# Patient Record
Sex: Female | Born: 1935 | Race: White | Hispanic: No | Smoking: Never smoker
Health system: Southern US, Community
[De-identification: ages and names within clinical notes are randomized; demographics above are authoritative.]

## PROBLEM LIST (undated history)

## (undated) DIAGNOSIS — I4891 Unspecified atrial fibrillation: Secondary | ICD-10-CM

## (undated) DIAGNOSIS — D631 Anemia in chronic kidney disease: Secondary | ICD-10-CM

## (undated) DIAGNOSIS — Z89619 Acquired absence of unspecified leg above knee: Secondary | ICD-10-CM

## (undated) DIAGNOSIS — I5032 Chronic diastolic (congestive) heart failure: Secondary | ICD-10-CM

## (undated) DIAGNOSIS — N289 Disorder of kidney and ureter, unspecified: Secondary | ICD-10-CM

## (undated) DIAGNOSIS — Z79899 Other long term (current) drug therapy: Secondary | ICD-10-CM

## (undated) DIAGNOSIS — N189 Chronic kidney disease, unspecified: Secondary | ICD-10-CM

## (undated) DIAGNOSIS — I442 Atrioventricular block, complete: Secondary | ICD-10-CM

## (undated) DIAGNOSIS — Z950 Presence of cardiac pacemaker: Secondary | ICD-10-CM

## (undated) DIAGNOSIS — F329 Major depressive disorder, single episode, unspecified: Secondary | ICD-10-CM

## (undated) DIAGNOSIS — R112 Nausea with vomiting, unspecified: Secondary | ICD-10-CM

## (undated) DIAGNOSIS — E875 Hyperkalemia: Secondary | ICD-10-CM

## (undated) DIAGNOSIS — R9430 Abnormal result of cardiovascular function study, unspecified: Secondary | ICD-10-CM

## (undated) DIAGNOSIS — I10 Essential (primary) hypertension: Secondary | ICD-10-CM

## (undated) DIAGNOSIS — Z9889 Other specified postprocedural states: Secondary | ICD-10-CM

## (undated) DIAGNOSIS — F32A Depression, unspecified: Secondary | ICD-10-CM

## (undated) DIAGNOSIS — I951 Orthostatic hypotension: Secondary | ICD-10-CM

## (undated) DIAGNOSIS — Z9981 Dependence on supplemental oxygen: Secondary | ICD-10-CM

## (undated) DIAGNOSIS — IMO0002 Reserved for concepts with insufficient information to code with codable children: Secondary | ICD-10-CM

## (undated) DIAGNOSIS — S68119A Complete traumatic metacarpophalangeal amputation of unspecified finger, initial encounter: Secondary | ICD-10-CM

## (undated) DIAGNOSIS — E785 Hyperlipidemia, unspecified: Secondary | ICD-10-CM

## (undated) DIAGNOSIS — J9690 Respiratory failure, unspecified, unspecified whether with hypoxia or hypercapnia: Secondary | ICD-10-CM

## (undated) DIAGNOSIS — F0390 Unspecified dementia without behavioral disturbance: Secondary | ICD-10-CM

## (undated) DIAGNOSIS — K219 Gastro-esophageal reflux disease without esophagitis: Secondary | ICD-10-CM

## (undated) DIAGNOSIS — I2510 Atherosclerotic heart disease of native coronary artery without angina pectoris: Secondary | ICD-10-CM

## (undated) DIAGNOSIS — E039 Hypothyroidism, unspecified: Secondary | ICD-10-CM

## (undated) DIAGNOSIS — G450 Vertebro-basilar artery syndrome: Secondary | ICD-10-CM

## (undated) DIAGNOSIS — R269 Unspecified abnormalities of gait and mobility: Secondary | ICD-10-CM

## (undated) DIAGNOSIS — I472 Ventricular tachycardia, unspecified: Secondary | ICD-10-CM

## (undated) DIAGNOSIS — I4892 Unspecified atrial flutter: Secondary | ICD-10-CM

## (undated) DIAGNOSIS — H5440 Blindness, one eye, unspecified eye: Secondary | ICD-10-CM

## (undated) DIAGNOSIS — I739 Peripheral vascular disease, unspecified: Secondary | ICD-10-CM

## (undated) DIAGNOSIS — G459 Transient cerebral ischemic attack, unspecified: Secondary | ICD-10-CM

## (undated) DIAGNOSIS — K3184 Gastroparesis: Secondary | ICD-10-CM

## (undated) DIAGNOSIS — I272 Pulmonary hypertension, unspecified: Secondary | ICD-10-CM

## (undated) HISTORY — PX: CHOLECYSTECTOMY: SHX55

## (undated) HISTORY — DX: Atherosclerotic heart disease of native coronary artery without angina pectoris: I25.10

## (undated) HISTORY — PX: PACEMAKER INSERTION: SHX728

## (undated) HISTORY — DX: Hypothyroidism, unspecified: E03.9

## (undated) HISTORY — DX: Complete traumatic metacarpophalangeal amputation of unspecified finger, initial encounter: S68.119A

## (undated) HISTORY — DX: Unspecified atrial flutter: I48.92

## (undated) HISTORY — PX: OTHER SURGICAL HISTORY: SHX169

## (undated) HISTORY — DX: Hyperlipidemia, unspecified: E78.5

## (undated) HISTORY — DX: Ventricular tachycardia: I47.2

## (undated) HISTORY — DX: Depression, unspecified: F32.A

## (undated) HISTORY — DX: Blindness, one eye, unspecified eye: H54.40

## (undated) HISTORY — DX: Chronic kidney disease, unspecified: N18.9

## (undated) HISTORY — DX: Acquired absence of unspecified leg above knee: Z89.619

## (undated) HISTORY — PX: TOTAL ABDOMINAL HYSTERECTOMY: SHX209

## (undated) HISTORY — DX: Major depressive disorder, single episode, unspecified: F32.9

## (undated) HISTORY — DX: Disorder of kidney and ureter, unspecified: N28.9

## (undated) HISTORY — DX: Transient cerebral ischemic attack, unspecified: G45.9

## (undated) HISTORY — DX: Gastro-esophageal reflux disease without esophagitis: K21.9

## (undated) HISTORY — DX: Gastroparesis: K31.84

## (undated) HISTORY — DX: Respiratory failure, unspecified, unspecified whether with hypoxia or hypercapnia: J96.90

## (undated) HISTORY — DX: Peripheral vascular disease, unspecified: I73.9

## (undated) HISTORY — DX: Pulmonary hypertension, unspecified: I27.20

## (undated) HISTORY — DX: Unspecified atrial fibrillation: I48.91

## (undated) HISTORY — PX: CAROTID ENDARTERECTOMY: SUR193

## (undated) HISTORY — DX: Hyperkalemia: E87.5

## (undated) HISTORY — DX: Presence of cardiac pacemaker: Z95.0

## (undated) HISTORY — DX: Chronic kidney disease, unspecified: D63.1

## (undated) HISTORY — PX: TONSILLECTOMY: SUR1361

## (undated) HISTORY — DX: Reserved for concepts with insufficient information to code with codable children: IMO0002

## (undated) HISTORY — DX: Orthostatic hypotension: I95.1

## (undated) HISTORY — DX: Ventricular tachycardia, unspecified: I47.20

## (undated) HISTORY — DX: Atrioventricular block, complete: I44.2

## (undated) HISTORY — DX: Abnormal result of cardiovascular function study, unspecified: R94.30

## (undated) HISTORY — DX: Unspecified abnormalities of gait and mobility: R26.9

## (undated) HISTORY — DX: Unspecified dementia, unspecified severity, without behavioral disturbance, psychotic disturbance, mood disturbance, and anxiety: F03.90

## (undated) HISTORY — DX: Vertebro-basilar artery syndrome: G45.0

## (undated) HISTORY — DX: Essential (primary) hypertension: I10

---

## 1998-06-18 ENCOUNTER — Encounter: Payer: Self-pay | Admitting: Vascular Surgery

## 1998-06-19 ENCOUNTER — Inpatient Hospital Stay: Admission: RE | Admit: 1998-06-19 | Discharge: 1998-06-20 | Payer: Self-pay | Admitting: Vascular Surgery

## 1999-03-15 ENCOUNTER — Inpatient Hospital Stay (HOSPITAL_COMMUNITY): Admission: EM | Admit: 1999-03-15 | Discharge: 1999-03-19 | Payer: Self-pay | Admitting: Emergency Medicine

## 1999-03-16 ENCOUNTER — Encounter: Payer: Self-pay | Admitting: Emergency Medicine

## 1999-03-20 ENCOUNTER — Inpatient Hospital Stay (HOSPITAL_COMMUNITY): Admission: EM | Admit: 1999-03-20 | Discharge: 1999-03-23 | Payer: Self-pay | Admitting: Cardiology

## 1999-03-22 ENCOUNTER — Encounter: Payer: Self-pay | Admitting: Neurology

## 2000-01-17 ENCOUNTER — Emergency Department (HOSPITAL_COMMUNITY): Admission: EM | Admit: 2000-01-17 | Discharge: 2000-01-17 | Payer: Self-pay | Admitting: Emergency Medicine

## 2000-01-17 ENCOUNTER — Encounter: Payer: Self-pay | Admitting: Emergency Medicine

## 2000-08-10 ENCOUNTER — Encounter: Payer: Self-pay | Admitting: Neurology

## 2000-08-10 ENCOUNTER — Ambulatory Visit (HOSPITAL_COMMUNITY): Admission: RE | Admit: 2000-08-10 | Discharge: 2000-08-10 | Payer: Self-pay | Admitting: Neurology

## 2000-08-16 ENCOUNTER — Inpatient Hospital Stay (HOSPITAL_COMMUNITY): Admission: EM | Admit: 2000-08-16 | Discharge: 2000-08-26 | Payer: Self-pay | Admitting: *Deleted

## 2000-08-16 ENCOUNTER — Encounter (INDEPENDENT_AMBULATORY_CARE_PROVIDER_SITE_OTHER): Payer: Self-pay | Admitting: *Deleted

## 2000-08-17 ENCOUNTER — Encounter: Payer: Self-pay | Admitting: Family Medicine

## 2000-08-19 ENCOUNTER — Encounter: Payer: Self-pay | Admitting: Family Medicine

## 2000-08-25 ENCOUNTER — Encounter: Payer: Self-pay | Admitting: Family Medicine

## 2000-09-02 ENCOUNTER — Encounter: Admission: RE | Admit: 2000-09-02 | Discharge: 2000-09-02 | Payer: Self-pay | Admitting: Family Medicine

## 2000-12-22 ENCOUNTER — Ambulatory Visit (HOSPITAL_COMMUNITY): Admission: RE | Admit: 2000-12-22 | Discharge: 2000-12-22 | Payer: Self-pay | Admitting: Internal Medicine

## 2000-12-22 ENCOUNTER — Encounter: Payer: Self-pay | Admitting: Internal Medicine

## 2001-12-22 ENCOUNTER — Ambulatory Visit (HOSPITAL_COMMUNITY): Admission: RE | Admit: 2001-12-22 | Discharge: 2001-12-22 | Payer: Self-pay | Admitting: Family Medicine

## 2001-12-22 ENCOUNTER — Encounter: Payer: Self-pay | Admitting: Family Medicine

## 2001-12-28 ENCOUNTER — Encounter: Payer: Self-pay | Admitting: Family Medicine

## 2001-12-28 ENCOUNTER — Ambulatory Visit (HOSPITAL_COMMUNITY): Admission: RE | Admit: 2001-12-28 | Discharge: 2001-12-28 | Payer: Self-pay | Admitting: Family Medicine

## 2002-01-08 ENCOUNTER — Inpatient Hospital Stay (HOSPITAL_COMMUNITY): Admission: EM | Admit: 2002-01-08 | Discharge: 2002-01-12 | Payer: Self-pay

## 2002-01-10 ENCOUNTER — Encounter: Payer: Self-pay | Admitting: Internal Medicine

## 2002-01-23 ENCOUNTER — Emergency Department (HOSPITAL_COMMUNITY): Admission: EM | Admit: 2002-01-23 | Discharge: 2002-01-24 | Payer: Self-pay | Admitting: Emergency Medicine

## 2002-03-15 ENCOUNTER — Encounter: Payer: Self-pay | Admitting: Family Medicine

## 2002-03-15 ENCOUNTER — Ambulatory Visit (HOSPITAL_COMMUNITY): Admission: RE | Admit: 2002-03-15 | Discharge: 2002-03-15 | Payer: Self-pay | Admitting: Family Medicine

## 2002-12-27 ENCOUNTER — Encounter: Payer: Self-pay | Admitting: Family Medicine

## 2002-12-27 ENCOUNTER — Ambulatory Visit (HOSPITAL_COMMUNITY): Admission: RE | Admit: 2002-12-27 | Discharge: 2002-12-27 | Payer: Self-pay | Admitting: Family Medicine

## 2003-03-16 ENCOUNTER — Ambulatory Visit (HOSPITAL_COMMUNITY): Admission: RE | Admit: 2003-03-16 | Discharge: 2003-03-16 | Payer: Self-pay | Admitting: Family Medicine

## 2003-03-16 ENCOUNTER — Encounter: Payer: Self-pay | Admitting: Family Medicine

## 2003-07-12 ENCOUNTER — Ambulatory Visit (HOSPITAL_COMMUNITY): Admission: RE | Admit: 2003-07-12 | Discharge: 2003-07-12 | Payer: Self-pay | Admitting: Family Medicine

## 2003-08-08 ENCOUNTER — Ambulatory Visit (HOSPITAL_COMMUNITY): Admission: RE | Admit: 2003-08-08 | Discharge: 2003-08-08 | Payer: Self-pay | Admitting: Family Medicine

## 2003-09-07 ENCOUNTER — Inpatient Hospital Stay (HOSPITAL_COMMUNITY): Admission: EM | Admit: 2003-09-07 | Discharge: 2003-09-14 | Payer: Self-pay | Admitting: Emergency Medicine

## 2003-09-08 ENCOUNTER — Encounter (INDEPENDENT_AMBULATORY_CARE_PROVIDER_SITE_OTHER): Payer: Self-pay | Admitting: *Deleted

## 2003-09-08 ENCOUNTER — Encounter: Payer: Self-pay | Admitting: Cardiovascular Disease

## 2003-10-06 ENCOUNTER — Ambulatory Visit (HOSPITAL_COMMUNITY): Admission: RE | Admit: 2003-10-06 | Discharge: 2003-10-06 | Payer: Self-pay | Admitting: *Deleted

## 2003-11-16 ENCOUNTER — Ambulatory Visit (HOSPITAL_COMMUNITY): Admission: RE | Admit: 2003-11-16 | Discharge: 2003-11-16 | Payer: Self-pay | Admitting: Cardiovascular Disease

## 2003-12-18 ENCOUNTER — Ambulatory Visit (HOSPITAL_COMMUNITY): Admission: RE | Admit: 2003-12-18 | Discharge: 2003-12-18 | Payer: Self-pay | Admitting: *Deleted

## 2004-01-01 ENCOUNTER — Ambulatory Visit (HOSPITAL_COMMUNITY): Admission: RE | Admit: 2004-01-01 | Discharge: 2004-01-01 | Payer: Self-pay | Admitting: Family Medicine

## 2004-02-13 ENCOUNTER — Encounter (HOSPITAL_COMMUNITY): Admission: RE | Admit: 2004-02-13 | Discharge: 2004-03-14 | Payer: Self-pay | Admitting: *Deleted

## 2004-03-15 ENCOUNTER — Encounter (HOSPITAL_COMMUNITY): Admission: RE | Admit: 2004-03-15 | Discharge: 2004-04-14 | Payer: Self-pay | Admitting: *Deleted

## 2004-07-27 ENCOUNTER — Observation Stay (HOSPITAL_COMMUNITY): Admission: EM | Admit: 2004-07-27 | Discharge: 2004-07-29 | Payer: Self-pay | Admitting: Emergency Medicine

## 2004-12-11 ENCOUNTER — Ambulatory Visit (HOSPITAL_COMMUNITY): Admission: RE | Admit: 2004-12-11 | Discharge: 2004-12-11 | Payer: Self-pay | Admitting: *Deleted

## 2005-01-03 ENCOUNTER — Ambulatory Visit (HOSPITAL_COMMUNITY): Admission: RE | Admit: 2005-01-03 | Discharge: 2005-01-03 | Payer: Self-pay | Admitting: Family Medicine

## 2005-01-16 ENCOUNTER — Ambulatory Visit (HOSPITAL_COMMUNITY): Admission: RE | Admit: 2005-01-16 | Discharge: 2005-01-16 | Payer: Self-pay | Admitting: *Deleted

## 2005-02-25 ENCOUNTER — Ambulatory Visit (HOSPITAL_COMMUNITY): Admission: RE | Admit: 2005-02-25 | Discharge: 2005-02-26 | Payer: Self-pay | Admitting: Cardiology

## 2005-11-18 ENCOUNTER — Ambulatory Visit (HOSPITAL_COMMUNITY): Admission: RE | Admit: 2005-11-18 | Discharge: 2005-11-18 | Payer: Self-pay | Admitting: Family Medicine

## 2005-11-21 ENCOUNTER — Ambulatory Visit (HOSPITAL_COMMUNITY): Admission: RE | Admit: 2005-11-21 | Discharge: 2005-11-21 | Payer: Self-pay | Admitting: Family Medicine

## 2006-01-05 ENCOUNTER — Ambulatory Visit (HOSPITAL_COMMUNITY): Admission: RE | Admit: 2006-01-05 | Discharge: 2006-01-05 | Payer: Self-pay | Admitting: Family Medicine

## 2006-08-26 ENCOUNTER — Ambulatory Visit (HOSPITAL_COMMUNITY): Admission: RE | Admit: 2006-08-26 | Discharge: 2006-08-26 | Payer: Self-pay | Admitting: Family Medicine

## 2006-09-23 ENCOUNTER — Ambulatory Visit (HOSPITAL_COMMUNITY): Admission: RE | Admit: 2006-09-23 | Discharge: 2006-09-23 | Payer: Self-pay | Admitting: Family Medicine

## 2006-11-05 ENCOUNTER — Encounter: Admission: RE | Admit: 2006-11-05 | Discharge: 2006-11-05 | Payer: Self-pay | Admitting: Family Medicine

## 2006-11-19 ENCOUNTER — Encounter: Admission: RE | Admit: 2006-11-19 | Discharge: 2006-11-19 | Payer: Self-pay | Admitting: Family Medicine

## 2006-12-21 ENCOUNTER — Ambulatory Visit (HOSPITAL_COMMUNITY): Admission: RE | Admit: 2006-12-21 | Discharge: 2006-12-21 | Payer: Self-pay | Admitting: Family Medicine

## 2007-09-30 ENCOUNTER — Encounter: Payer: Self-pay | Admitting: Cardiology

## 2008-03-22 ENCOUNTER — Ambulatory Visit: Payer: Self-pay | Admitting: Cardiology

## 2008-03-23 ENCOUNTER — Encounter: Payer: Self-pay | Admitting: Cardiology

## 2008-03-27 ENCOUNTER — Ambulatory Visit: Payer: Self-pay | Admitting: Cardiology

## 2008-04-18 ENCOUNTER — Ambulatory Visit: Payer: Self-pay | Admitting: Cardiology

## 2008-04-24 ENCOUNTER — Ambulatory Visit: Payer: Self-pay | Admitting: Cardiology

## 2008-04-24 ENCOUNTER — Encounter: Payer: Self-pay | Admitting: Cardiology

## 2008-06-30 ENCOUNTER — Ambulatory Visit: Payer: Self-pay | Admitting: Cardiology

## 2008-08-21 ENCOUNTER — Ambulatory Visit (HOSPITAL_COMMUNITY): Admission: RE | Admit: 2008-08-21 | Discharge: 2008-08-21 | Payer: Self-pay | Admitting: Family Medicine

## 2008-09-08 HISTORY — PX: AMPUTATION: SHX166

## 2008-09-19 ENCOUNTER — Ambulatory Visit: Payer: Self-pay | Admitting: Vascular Surgery

## 2008-09-28 ENCOUNTER — Encounter: Payer: Self-pay | Admitting: Cardiology

## 2008-09-28 ENCOUNTER — Ambulatory Visit: Payer: Self-pay | Admitting: Cardiology

## 2008-10-06 ENCOUNTER — Encounter: Payer: Self-pay | Admitting: Cardiology

## 2008-10-10 ENCOUNTER — Encounter: Payer: Self-pay | Admitting: Cardiology

## 2008-10-10 ENCOUNTER — Inpatient Hospital Stay (HOSPITAL_COMMUNITY): Admission: AD | Admit: 2008-10-10 | Discharge: 2008-10-24 | Payer: Self-pay | Admitting: Internal Medicine

## 2008-10-11 ENCOUNTER — Ambulatory Visit: Payer: Self-pay | Admitting: Surgery

## 2008-10-18 ENCOUNTER — Encounter (INDEPENDENT_AMBULATORY_CARE_PROVIDER_SITE_OTHER): Payer: Self-pay | Admitting: Orthopedic Surgery

## 2008-10-20 ENCOUNTER — Ambulatory Visit: Payer: Self-pay | Admitting: Physical Medicine & Rehabilitation

## 2008-10-24 ENCOUNTER — Inpatient Hospital Stay (HOSPITAL_COMMUNITY)
Admission: RE | Admit: 2008-10-24 | Discharge: 2008-10-31 | Payer: Self-pay | Admitting: Physical Medicine & Rehabilitation

## 2008-11-20 ENCOUNTER — Ambulatory Visit: Payer: Self-pay | Admitting: Surgery

## 2009-04-08 DIAGNOSIS — I4891 Unspecified atrial fibrillation: Secondary | ICD-10-CM

## 2009-04-08 HISTORY — DX: Unspecified atrial fibrillation: I48.91

## 2009-04-23 ENCOUNTER — Ambulatory Visit: Payer: Self-pay | Admitting: Cardiology

## 2009-04-23 ENCOUNTER — Telehealth: Payer: Self-pay | Admitting: Cardiology

## 2009-04-25 ENCOUNTER — Inpatient Hospital Stay (HOSPITAL_COMMUNITY): Admission: RE | Admit: 2009-04-25 | Discharge: 2009-05-06 | Payer: Self-pay | Admitting: Cardiology

## 2009-04-25 ENCOUNTER — Ambulatory Visit: Payer: Self-pay | Admitting: Cardiology

## 2009-04-30 ENCOUNTER — Encounter: Payer: Self-pay | Admitting: Internal Medicine

## 2009-04-30 ENCOUNTER — Encounter: Payer: Self-pay | Admitting: Cardiology

## 2009-05-02 ENCOUNTER — Encounter (INDEPENDENT_AMBULATORY_CARE_PROVIDER_SITE_OTHER): Payer: Self-pay | Admitting: *Deleted

## 2009-05-04 ENCOUNTER — Encounter: Payer: Self-pay | Admitting: Cardiology

## 2009-05-06 ENCOUNTER — Encounter: Payer: Self-pay | Admitting: Internal Medicine

## 2009-05-08 ENCOUNTER — Ambulatory Visit: Payer: Self-pay | Admitting: Cardiology

## 2009-05-08 LAB — CONVERTED CEMR LAB: POC INR: 1.8

## 2009-05-09 ENCOUNTER — Encounter: Payer: Self-pay | Admitting: Cardiology

## 2009-05-11 ENCOUNTER — Ambulatory Visit: Payer: Self-pay | Admitting: Cardiology

## 2009-05-15 ENCOUNTER — Ambulatory Visit: Payer: Self-pay | Admitting: Cardiology

## 2009-05-15 ENCOUNTER — Encounter: Payer: Self-pay | Admitting: Cardiology

## 2009-05-15 LAB — CONVERTED CEMR LAB: POC INR: 4.3

## 2009-05-22 ENCOUNTER — Ambulatory Visit: Payer: Self-pay | Admitting: Cardiology

## 2009-05-22 LAB — CONVERTED CEMR LAB: POC INR: 3.1

## 2009-05-24 ENCOUNTER — Inpatient Hospital Stay (HOSPITAL_COMMUNITY): Admission: EM | Admit: 2009-05-24 | Discharge: 2009-05-25 | Payer: Self-pay | Admitting: Emergency Medicine

## 2009-05-24 ENCOUNTER — Ambulatory Visit: Payer: Self-pay | Admitting: Cardiology

## 2009-05-24 DIAGNOSIS — I4892 Unspecified atrial flutter: Secondary | ICD-10-CM

## 2009-05-24 HISTORY — DX: Unspecified atrial flutter: I48.92

## 2009-05-25 ENCOUNTER — Telehealth (INDEPENDENT_AMBULATORY_CARE_PROVIDER_SITE_OTHER): Payer: Self-pay | Admitting: *Deleted

## 2009-05-25 ENCOUNTER — Encounter: Payer: Self-pay | Admitting: Cardiology

## 2009-05-28 ENCOUNTER — Encounter: Payer: Self-pay | Admitting: Cardiology

## 2009-05-29 ENCOUNTER — Encounter (INDEPENDENT_AMBULATORY_CARE_PROVIDER_SITE_OTHER): Payer: Self-pay | Admitting: *Deleted

## 2009-05-29 ENCOUNTER — Ambulatory Visit: Payer: Self-pay | Admitting: Cardiology

## 2009-05-30 ENCOUNTER — Encounter: Payer: Self-pay | Admitting: Cardiology

## 2009-05-31 ENCOUNTER — Inpatient Hospital Stay (HOSPITAL_COMMUNITY): Admission: AD | Admit: 2009-05-31 | Discharge: 2009-06-02 | Payer: Self-pay | Admitting: Cardiology

## 2009-05-31 ENCOUNTER — Ambulatory Visit: Payer: Self-pay | Admitting: Cardiology

## 2009-06-01 ENCOUNTER — Encounter: Payer: Self-pay | Admitting: Cardiology

## 2009-06-01 ENCOUNTER — Encounter: Payer: Self-pay | Admitting: Internal Medicine

## 2009-06-02 ENCOUNTER — Encounter: Payer: Self-pay | Admitting: Internal Medicine

## 2009-06-02 ENCOUNTER — Encounter: Payer: Self-pay | Admitting: Cardiology

## 2009-06-04 ENCOUNTER — Telehealth (INDEPENDENT_AMBULATORY_CARE_PROVIDER_SITE_OTHER): Payer: Self-pay | Admitting: *Deleted

## 2009-06-05 ENCOUNTER — Ambulatory Visit: Payer: Self-pay | Admitting: Cardiology

## 2009-06-05 ENCOUNTER — Telehealth: Payer: Self-pay | Admitting: Internal Medicine

## 2009-06-05 ENCOUNTER — Encounter: Payer: Self-pay | Admitting: Internal Medicine

## 2009-06-12 ENCOUNTER — Ambulatory Visit: Payer: Self-pay | Admitting: Cardiology

## 2009-06-12 LAB — CONVERTED CEMR LAB: POC INR: 2.3

## 2009-06-15 ENCOUNTER — Ambulatory Visit: Payer: Self-pay | Admitting: Internal Medicine

## 2009-06-19 ENCOUNTER — Ambulatory Visit: Payer: Self-pay | Admitting: Cardiology

## 2009-06-19 LAB — CONVERTED CEMR LAB: POC INR: 2.1

## 2009-06-26 ENCOUNTER — Ambulatory Visit: Payer: Self-pay | Admitting: Cardiology

## 2009-06-26 LAB — CONVERTED CEMR LAB: POC INR: 2.7

## 2009-06-27 ENCOUNTER — Ambulatory Visit: Payer: Self-pay | Admitting: Cardiology

## 2009-07-10 ENCOUNTER — Ambulatory Visit: Payer: Self-pay | Admitting: Cardiology

## 2009-07-10 LAB — CONVERTED CEMR LAB: POC INR: 2.2

## 2009-08-07 ENCOUNTER — Ambulatory Visit: Payer: Self-pay | Admitting: Cardiology

## 2009-09-04 ENCOUNTER — Ambulatory Visit: Payer: Self-pay | Admitting: Cardiology

## 2009-09-04 LAB — CONVERTED CEMR LAB: POC INR: 2.4

## 2009-10-02 ENCOUNTER — Ambulatory Visit: Payer: Self-pay | Admitting: Cardiology

## 2009-10-02 LAB — CONVERTED CEMR LAB: POC INR: 3.8

## 2009-10-15 ENCOUNTER — Emergency Department (HOSPITAL_COMMUNITY): Admission: EM | Admit: 2009-10-15 | Discharge: 2009-10-15 | Payer: Self-pay | Admitting: Emergency Medicine

## 2009-10-15 ENCOUNTER — Encounter: Payer: Self-pay | Admitting: Cardiology

## 2009-10-20 ENCOUNTER — Emergency Department (HOSPITAL_COMMUNITY): Admission: EM | Admit: 2009-10-20 | Discharge: 2009-10-20 | Payer: Self-pay | Admitting: Emergency Medicine

## 2009-10-20 ENCOUNTER — Encounter: Payer: Self-pay | Admitting: Cardiology

## 2009-11-02 ENCOUNTER — Ambulatory Visit: Payer: Self-pay | Admitting: Cardiology

## 2009-11-06 DIAGNOSIS — K3184 Gastroparesis: Secondary | ICD-10-CM

## 2009-11-06 HISTORY — DX: Gastroparesis: K31.84

## 2009-11-07 ENCOUNTER — Encounter: Payer: Self-pay | Admitting: Cardiology

## 2009-11-07 ENCOUNTER — Encounter (HOSPITAL_COMMUNITY): Admission: RE | Admit: 2009-11-07 | Discharge: 2009-12-07 | Payer: Self-pay | Admitting: Family Medicine

## 2009-11-14 ENCOUNTER — Ambulatory Visit: Payer: Self-pay | Admitting: Cardiology

## 2009-11-29 ENCOUNTER — Ambulatory Visit: Payer: Self-pay | Admitting: Internal Medicine

## 2009-11-30 ENCOUNTER — Ambulatory Visit: Payer: Self-pay | Admitting: Cardiology

## 2009-12-03 ENCOUNTER — Encounter: Payer: Self-pay | Admitting: Cardiology

## 2009-12-03 ENCOUNTER — Encounter: Payer: Self-pay | Admitting: Gastroenterology

## 2009-12-03 IMAGING — CR DG CHEST 2V
2 series · 2 of 2 positions shown · non-contrast
Comparison: 05/24/2009

CLINICAL DATA: Preop today for cardioversion.

CHEST - 2 VIEW

[view not recorded (1 of 2)]
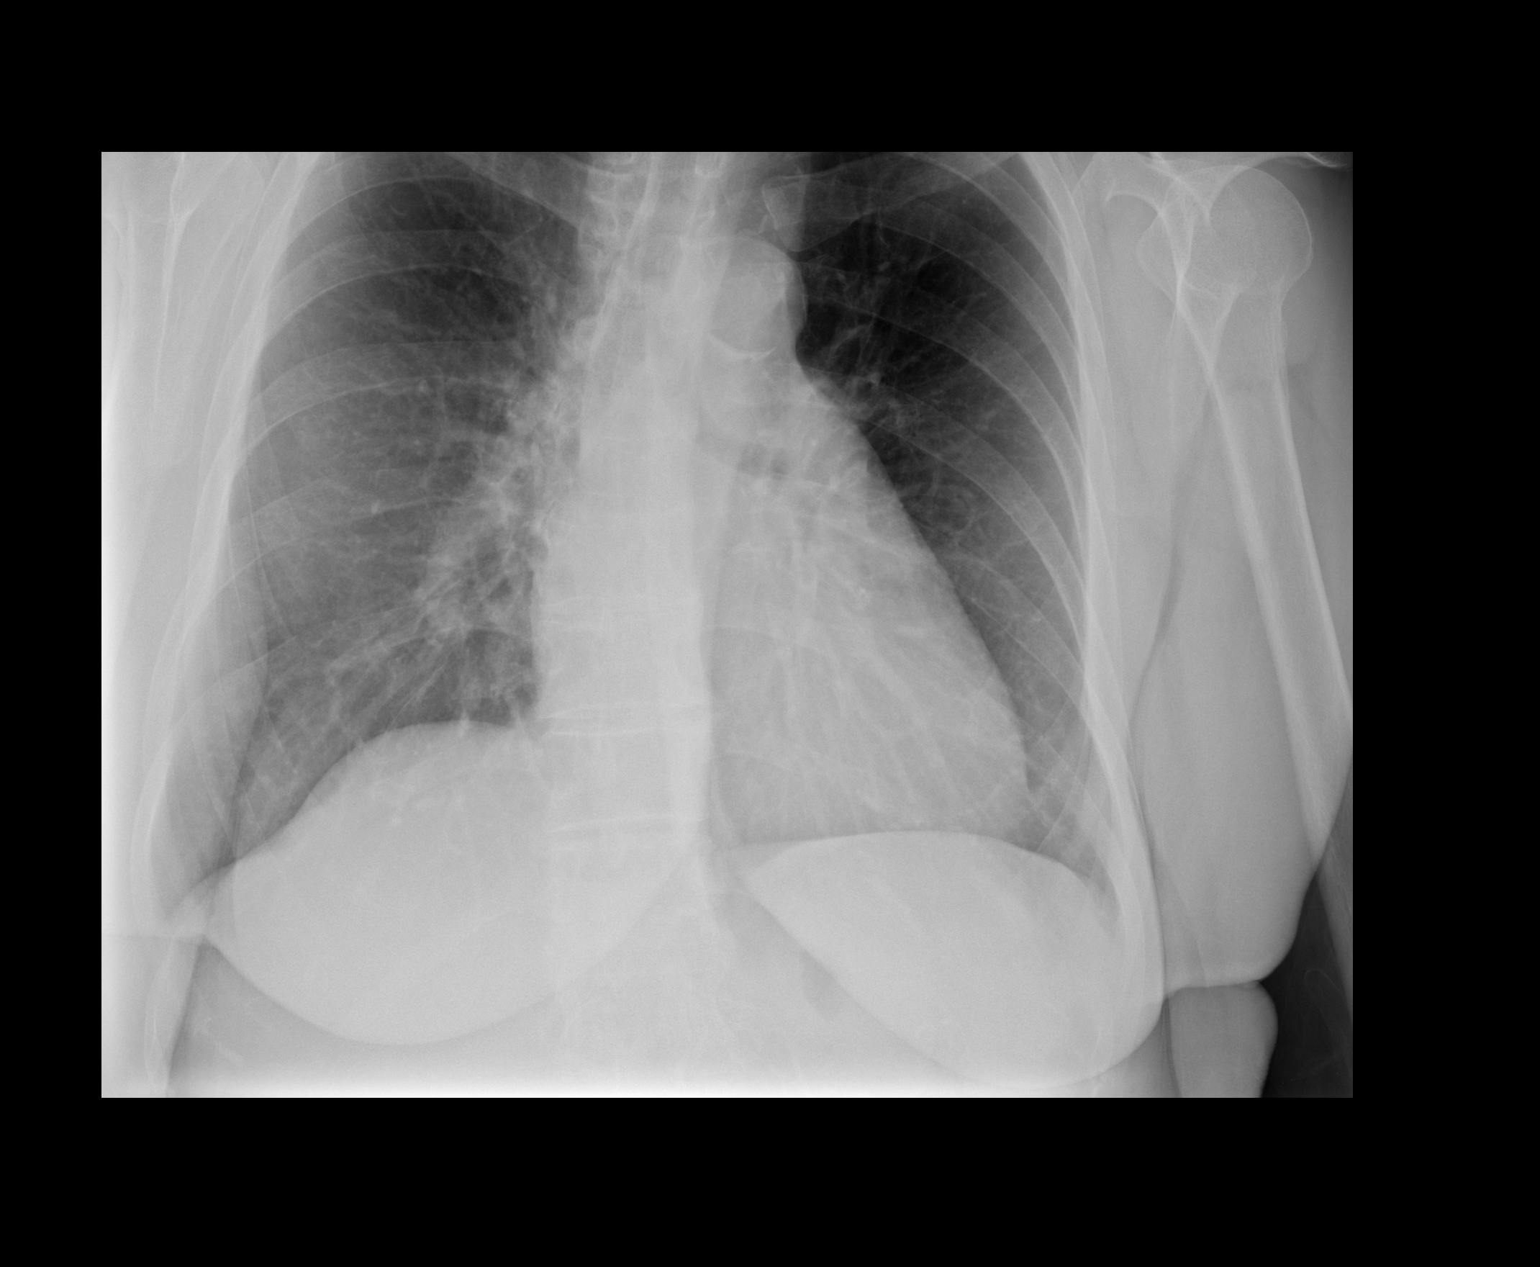

[view not recorded (2 of 2)]
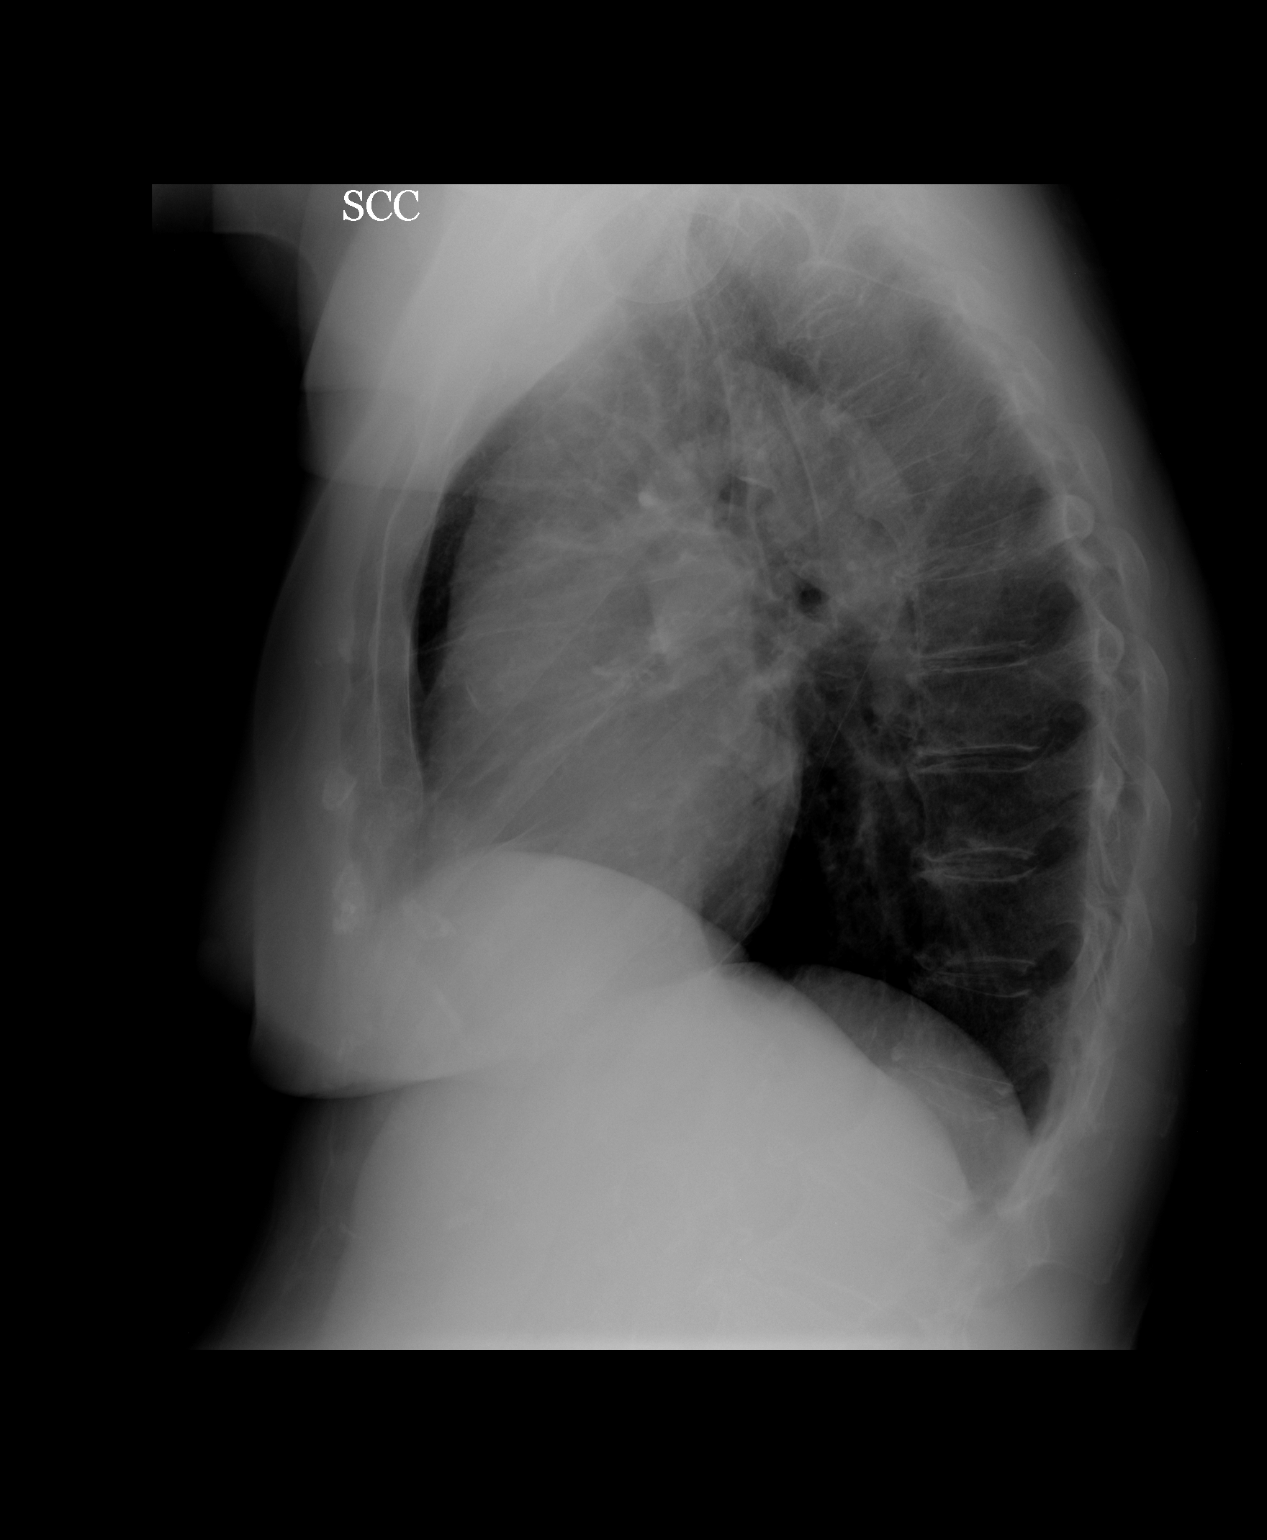

[2 of 2 positions shown; findings below may reference images not displayed]

FINDINGS: Cardiomegaly.  No pulmonary vascular congestion or active
lung process.
IMPRESSION: Cardiomegaly.

## 2009-12-05 ENCOUNTER — Encounter (INDEPENDENT_AMBULATORY_CARE_PROVIDER_SITE_OTHER): Payer: Self-pay

## 2009-12-05 ENCOUNTER — Encounter: Payer: Self-pay | Admitting: Gastroenterology

## 2009-12-05 LAB — CONVERTED CEMR LAB
ALT: 40 units/L — ABNORMAL HIGH (ref 0–35)
AST: 30 units/L (ref 0–37)
Albumin: 3.6 g/dL (ref 3.5–5.2)

## 2009-12-05 IMAGING — CR DG CHEST 2V
2 series · 2 of 2 positions shown · non-contrast
Comparison: 05/31/2009

CLINICAL DATA: Post pacemaker.

CHEST - 2 VIEW

[w chest pa]
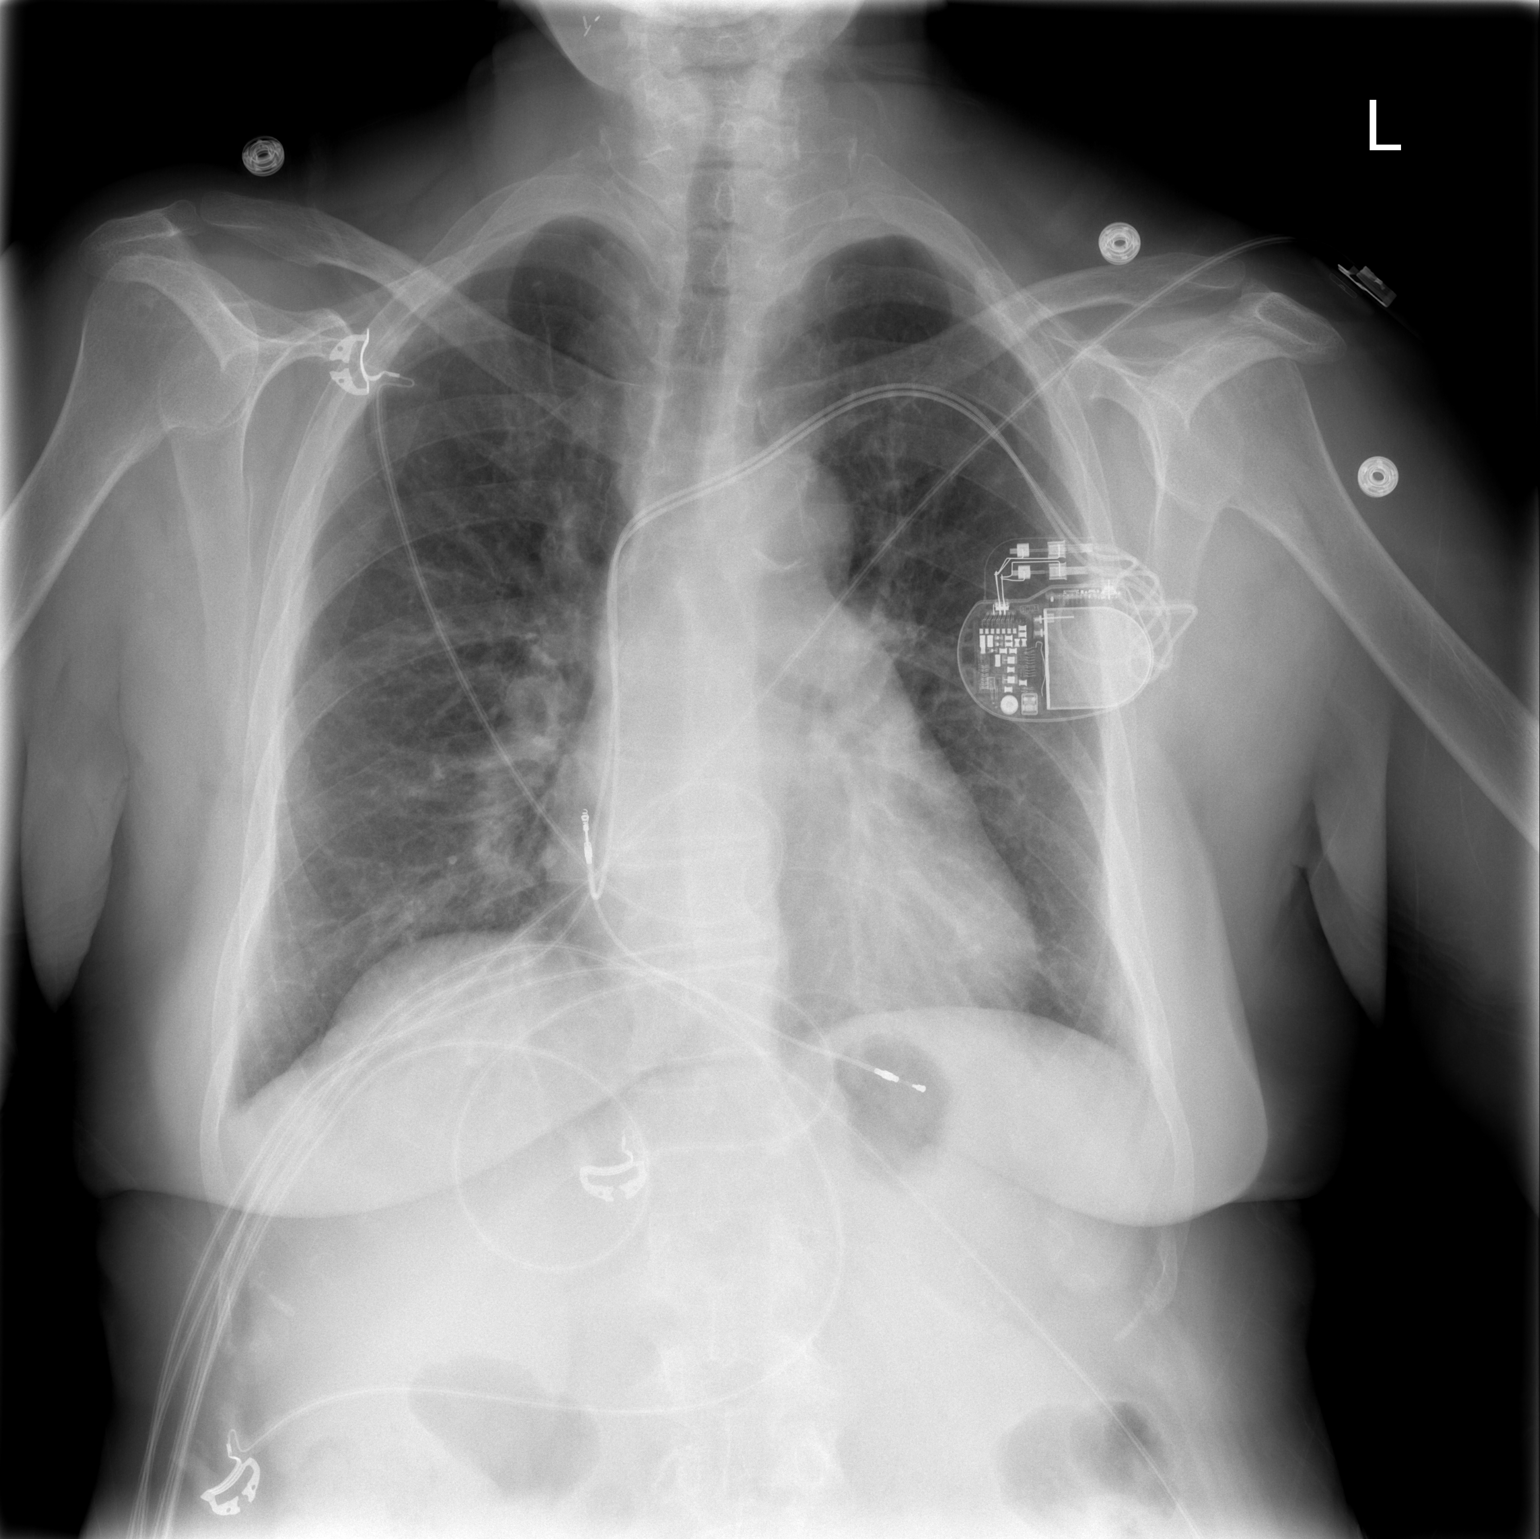

[w chest lat]
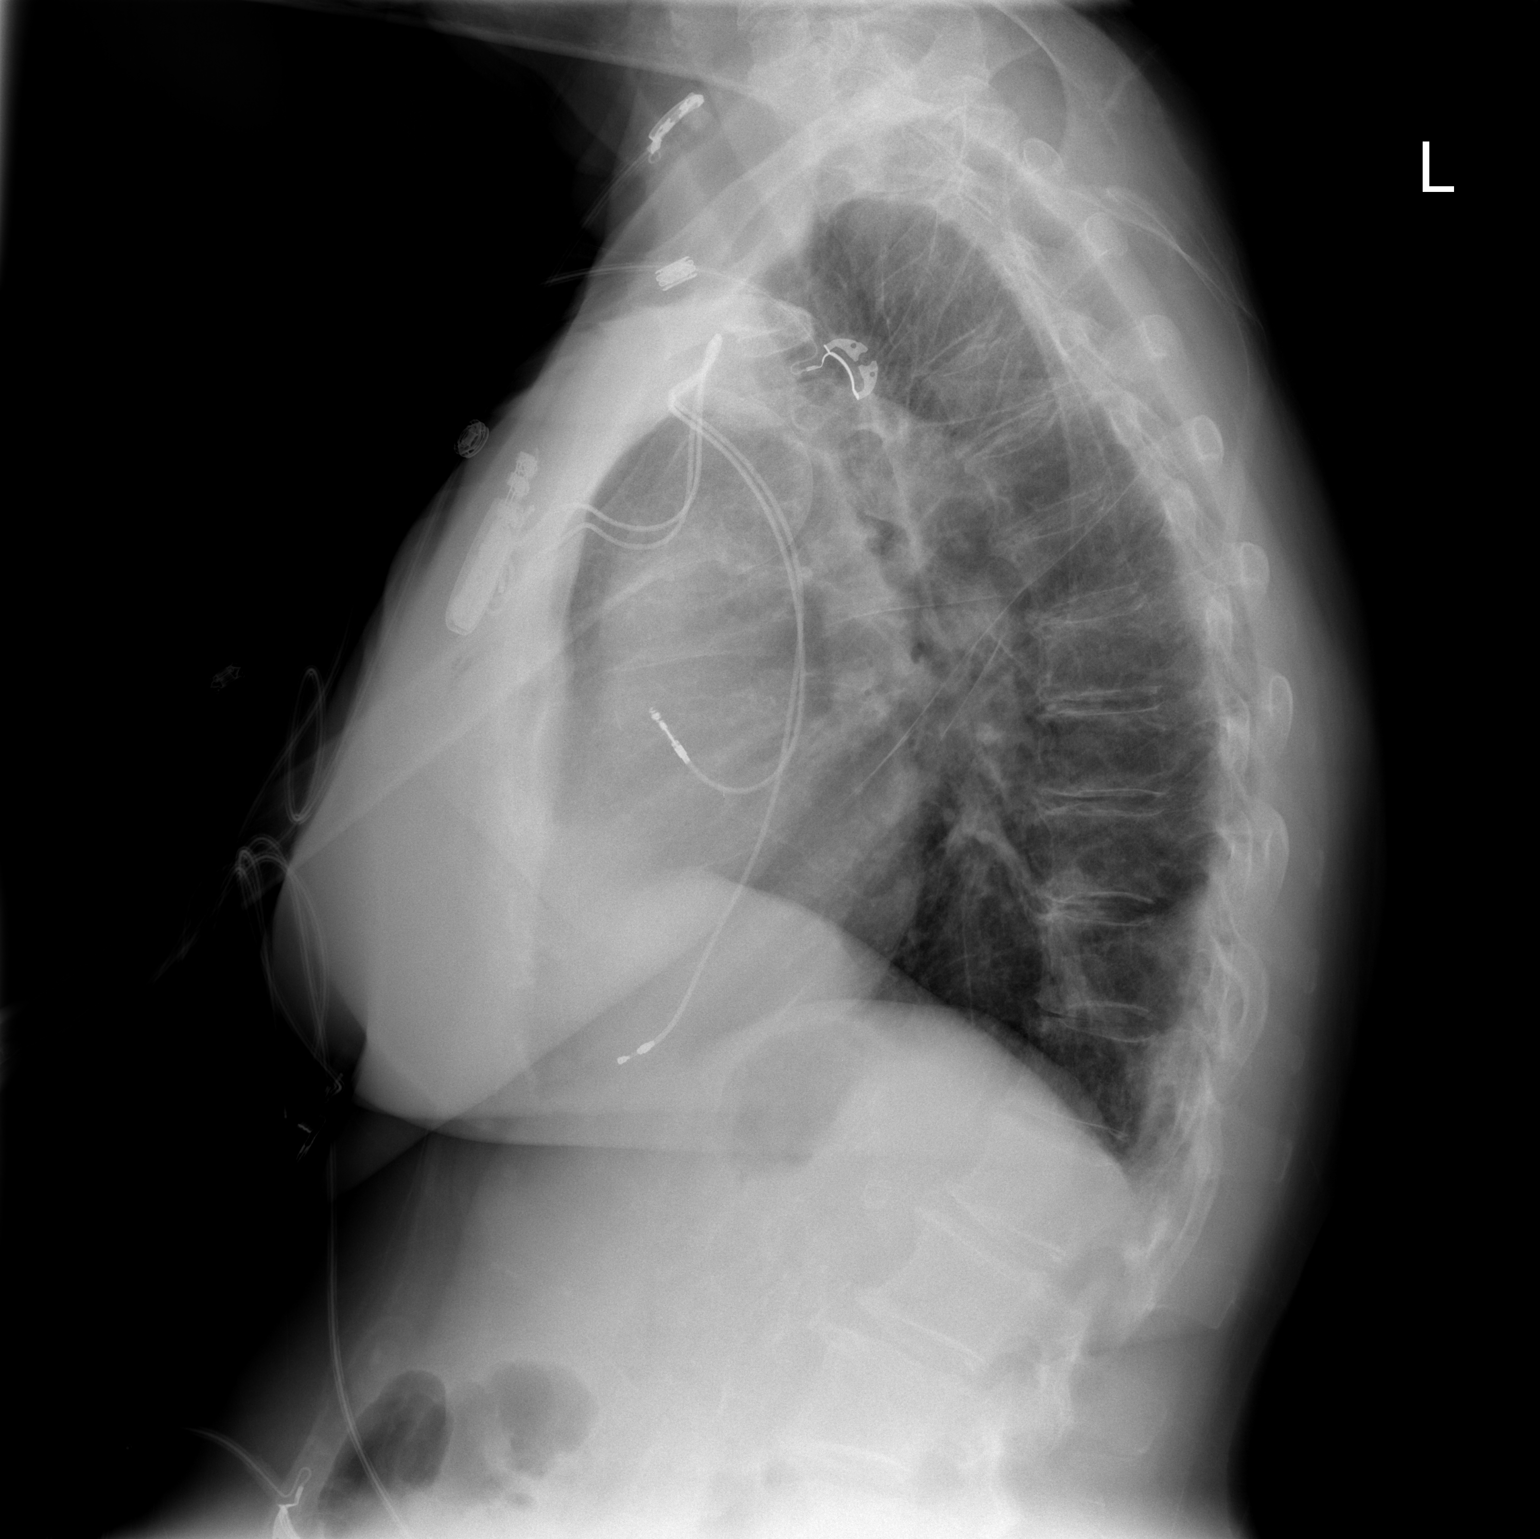

[2 of 2 positions shown; findings below may reference images not displayed]

FINDINGS: Pacer generator pack is in the left pectoral region.
Electrodes are directed towards the proximal right atrium and floor
the right ventricle.  No acute cardiopulmonary process.  Mild
cardiomegaly.  No pneumothorax.
IMPRESSION: Pacer leads appear in satisfactory position.  No acute chest
findings.

## 2009-12-06 ENCOUNTER — Encounter: Payer: Self-pay | Admitting: Internal Medicine

## 2009-12-13 ENCOUNTER — Emergency Department (HOSPITAL_COMMUNITY): Admission: EM | Admit: 2009-12-13 | Discharge: 2009-12-13 | Payer: Self-pay | Admitting: Emergency Medicine

## 2009-12-24 ENCOUNTER — Encounter: Payer: Self-pay | Admitting: Internal Medicine

## 2009-12-25 ENCOUNTER — Encounter: Payer: Self-pay | Admitting: Gastroenterology

## 2010-01-01 ENCOUNTER — Ambulatory Visit: Payer: Self-pay | Admitting: Cardiology

## 2010-01-13 ENCOUNTER — Inpatient Hospital Stay (HOSPITAL_COMMUNITY): Admission: EM | Admit: 2010-01-13 | Discharge: 2010-01-27 | Payer: Self-pay | Admitting: Emergency Medicine

## 2010-01-13 ENCOUNTER — Encounter: Payer: Self-pay | Admitting: Cardiology

## 2010-01-13 ENCOUNTER — Ambulatory Visit: Payer: Self-pay | Admitting: Cardiology

## 2010-01-16 ENCOUNTER — Encounter: Payer: Self-pay | Admitting: Cardiology

## 2010-01-16 ENCOUNTER — Encounter (INDEPENDENT_AMBULATORY_CARE_PROVIDER_SITE_OTHER): Payer: Self-pay | Admitting: Internal Medicine

## 2010-01-24 ENCOUNTER — Ambulatory Visit: Payer: Self-pay | Admitting: Cardiology

## 2010-01-26 ENCOUNTER — Encounter: Payer: Self-pay | Admitting: Cardiology

## 2010-01-27 ENCOUNTER — Encounter: Payer: Self-pay | Admitting: Cardiology

## 2010-01-28 ENCOUNTER — Telehealth (INDEPENDENT_AMBULATORY_CARE_PROVIDER_SITE_OTHER): Payer: Self-pay | Admitting: *Deleted

## 2010-01-31 ENCOUNTER — Telehealth: Payer: Self-pay | Admitting: Cardiology

## 2010-01-31 ENCOUNTER — Encounter: Payer: Self-pay | Admitting: Cardiology

## 2010-02-01 ENCOUNTER — Ambulatory Visit: Payer: Self-pay | Admitting: Internal Medicine

## 2010-02-05 ENCOUNTER — Inpatient Hospital Stay (HOSPITAL_COMMUNITY): Admission: EM | Admit: 2010-02-05 | Discharge: 2010-02-09 | Payer: Self-pay | Admitting: Emergency Medicine

## 2010-02-05 ENCOUNTER — Ambulatory Visit: Payer: Self-pay | Admitting: Gastroenterology

## 2010-02-05 ENCOUNTER — Encounter: Payer: Self-pay | Admitting: Cardiology

## 2010-02-05 ENCOUNTER — Ambulatory Visit: Payer: Self-pay | Admitting: Cardiology

## 2010-02-06 ENCOUNTER — Ambulatory Visit: Payer: Self-pay | Admitting: Gastroenterology

## 2010-02-06 ENCOUNTER — Encounter: Payer: Self-pay | Admitting: Cardiology

## 2010-02-08 ENCOUNTER — Encounter: Payer: Self-pay | Admitting: Cardiology

## 2010-02-09 ENCOUNTER — Encounter: Payer: Self-pay | Admitting: Cardiology

## 2010-02-10 ENCOUNTER — Encounter: Payer: Self-pay | Admitting: Cardiology

## 2010-02-11 ENCOUNTER — Ambulatory Visit: Payer: Self-pay | Admitting: Cardiology

## 2010-02-11 ENCOUNTER — Encounter: Payer: Self-pay | Admitting: Internal Medicine

## 2010-02-11 ENCOUNTER — Encounter: Payer: Self-pay | Admitting: Cardiology

## 2010-02-11 LAB — CONVERTED CEMR LAB: POC INR: 2.8

## 2010-02-12 ENCOUNTER — Encounter: Payer: Self-pay | Admitting: Cardiology

## 2010-02-19 ENCOUNTER — Telehealth: Payer: Self-pay | Admitting: Cardiology

## 2010-02-19 ENCOUNTER — Encounter: Payer: Self-pay | Admitting: Cardiology

## 2010-02-20 ENCOUNTER — Encounter (INDEPENDENT_AMBULATORY_CARE_PROVIDER_SITE_OTHER): Payer: Self-pay | Admitting: *Deleted

## 2010-04-05 ENCOUNTER — Ambulatory Visit: Payer: Self-pay | Admitting: Cardiology

## 2010-04-24 ENCOUNTER — Encounter: Payer: Self-pay | Admitting: Gastroenterology

## 2010-05-03 ENCOUNTER — Ambulatory Visit: Payer: Self-pay | Admitting: Cardiology

## 2010-05-20 ENCOUNTER — Encounter: Payer: Self-pay | Admitting: Cardiology

## 2010-05-31 ENCOUNTER — Ambulatory Visit: Payer: Self-pay | Admitting: Cardiology

## 2010-06-17 ENCOUNTER — Ambulatory Visit: Payer: Self-pay | Admitting: Cardiology

## 2010-07-08 ENCOUNTER — Ambulatory Visit: Payer: Self-pay | Admitting: Cardiology

## 2010-07-08 ENCOUNTER — Encounter: Payer: Self-pay | Admitting: Cardiology

## 2010-07-09 ENCOUNTER — Encounter: Payer: Self-pay | Admitting: Cardiology

## 2010-07-12 ENCOUNTER — Encounter: Payer: Self-pay | Admitting: Cardiology

## 2010-07-15 ENCOUNTER — Encounter: Payer: Self-pay | Admitting: Cardiology

## 2010-07-17 ENCOUNTER — Encounter: Payer: Self-pay | Admitting: Cardiology

## 2010-07-17 ENCOUNTER — Ambulatory Visit: Payer: Self-pay | Admitting: Cardiology

## 2010-07-17 LAB — CONVERTED CEMR LAB: POC INR: 1.4

## 2010-07-18 ENCOUNTER — Encounter (INDEPENDENT_AMBULATORY_CARE_PROVIDER_SITE_OTHER): Payer: Self-pay | Admitting: *Deleted

## 2010-07-23 ENCOUNTER — Ambulatory Visit: Payer: Self-pay | Admitting: Cardiology

## 2010-07-23 ENCOUNTER — Encounter: Payer: Self-pay | Admitting: Cardiology

## 2010-07-30 ENCOUNTER — Ambulatory Visit: Payer: Self-pay | Admitting: Cardiology

## 2010-07-30 ENCOUNTER — Telehealth: Payer: Self-pay | Admitting: Cardiology

## 2010-07-30 LAB — CONVERTED CEMR LAB: POC INR: 2.1

## 2010-08-02 ENCOUNTER — Encounter: Payer: Self-pay | Admitting: Cardiology

## 2010-08-05 ENCOUNTER — Ambulatory Visit: Payer: Self-pay | Admitting: Cardiology

## 2010-08-05 LAB — CONVERTED CEMR LAB: POC INR: 2

## 2010-08-06 ENCOUNTER — Encounter: Payer: Self-pay | Admitting: Cardiology

## 2010-08-08 ENCOUNTER — Encounter: Payer: Self-pay | Admitting: Cardiology

## 2010-08-08 ENCOUNTER — Inpatient Hospital Stay (HOSPITAL_COMMUNITY)
Admission: EM | Admit: 2010-08-08 | Discharge: 2010-08-15 | Disposition: A | Discharge status: Other Acute Inpatient Hospital | Payer: Self-pay | Source: Home / Self Care

## 2010-08-09 ENCOUNTER — Encounter: Payer: Self-pay | Admitting: Cardiology

## 2010-08-09 ENCOUNTER — Ambulatory Visit: Payer: Self-pay | Admitting: Cardiology

## 2010-08-10 ENCOUNTER — Encounter: Payer: Self-pay | Admitting: Cardiology

## 2010-08-14 ENCOUNTER — Encounter: Payer: Self-pay | Admitting: Internal Medicine

## 2010-08-14 ENCOUNTER — Inpatient Hospital Stay (HOSPITAL_COMMUNITY)
Admission: AD | Admit: 2010-08-14 | Discharge: 2010-08-15 | Payer: Self-pay | Source: Home / Self Care | Admitting: Cardiology

## 2010-08-14 ENCOUNTER — Encounter: Payer: Self-pay | Admitting: Cardiology

## 2010-08-15 ENCOUNTER — Encounter: Payer: Self-pay | Admitting: Cardiology

## 2010-08-20 ENCOUNTER — Encounter: Payer: Self-pay | Admitting: Internal Medicine

## 2010-08-20 ENCOUNTER — Ambulatory Visit: Payer: Self-pay | Admitting: Internal Medicine

## 2010-08-22 ENCOUNTER — Encounter: Payer: Self-pay | Admitting: Cardiology

## 2010-08-22 ENCOUNTER — Ambulatory Visit: Payer: Self-pay | Admitting: Cardiology

## 2010-08-22 LAB — CONVERTED CEMR LAB
POC INR: 3.4
POC INR: 3.4

## 2010-08-23 ENCOUNTER — Ambulatory Visit: Payer: Self-pay | Admitting: Cardiology

## 2010-08-27 ENCOUNTER — Encounter: Payer: Self-pay | Admitting: Cardiology

## 2010-08-27 LAB — CONVERTED CEMR LAB: POC INR: 2.9

## 2010-08-31 ENCOUNTER — Inpatient Hospital Stay (HOSPITAL_COMMUNITY): Admission: EM | Admit: 2010-08-31 | Discharge: 2010-09-06 | Payer: Self-pay | Source: Home / Self Care

## 2010-08-31 ENCOUNTER — Encounter: Payer: Self-pay | Admitting: Cardiology

## 2010-09-03 ENCOUNTER — Encounter: Payer: Self-pay | Admitting: Cardiology

## 2010-09-03 ENCOUNTER — Ambulatory Visit: Payer: Self-pay

## 2010-09-10 ENCOUNTER — Encounter: Payer: Self-pay | Admitting: Cardiology

## 2010-09-12 ENCOUNTER — Ambulatory Visit
Admission: RE | Admit: 2010-09-12 | Discharge: 2010-09-12 | Payer: Self-pay | Source: Home / Self Care | Attending: Cardiology | Admitting: Cardiology

## 2010-09-13 ENCOUNTER — Emergency Department (HOSPITAL_COMMUNITY)
Admission: EM | Admit: 2010-09-13 | Discharge: 2010-09-13 | Disposition: A | Discharge status: Other Acute Inpatient Hospital | Payer: Self-pay | Source: Home / Self Care | Admitting: Emergency Medicine

## 2010-09-13 ENCOUNTER — Encounter: Payer: Self-pay | Admitting: Cardiology

## 2010-09-13 ENCOUNTER — Inpatient Hospital Stay (HOSPITAL_COMMUNITY)
Admission: EM | Admit: 2010-09-13 | Discharge: 2010-09-23 | Payer: Self-pay | Attending: Cardiology | Admitting: Cardiology

## 2010-09-13 LAB — CBC
HCT: 35 % — ABNORMAL LOW (ref 36.0–46.0)
Hemoglobin: 12 g/dL (ref 12.0–15.0)
MCH: 29.5 pg (ref 26.0–34.0)
MCHC: 34.3 g/dL (ref 30.0–36.0)
MCV: 86 fL (ref 78.0–100.0)
Platelets: 450 10*3/uL — ABNORMAL HIGH (ref 150–400)
RBC: 4.07 MIL/uL (ref 3.87–5.11)
RDW: 13.8 % (ref 11.5–15.5)
WBC: 11.4 10*3/uL — ABNORMAL HIGH (ref 4.0–10.5)

## 2010-09-13 LAB — BASIC METABOLIC PANEL
BUN: 54 mg/dL — ABNORMAL HIGH (ref 6–23)
CO2: 32 mEq/L (ref 19–32)
Calcium: 9.3 mg/dL (ref 8.4–10.5)
Chloride: 99 mEq/L (ref 96–112)
Creatinine, Ser: 2.32 mg/dL — ABNORMAL HIGH (ref 0.4–1.2)
GFR calc Af Amer: 25 mL/min — ABNORMAL LOW (ref >60)
GFR calc non Af Amer: 20 mL/min — ABNORMAL LOW (ref >60)
Glucose, Bld: 124 mg/dL — ABNORMAL HIGH (ref 70–99)
Potassium: 4.1 mEq/L (ref 3.5–5.1)
Sodium: 139 mEq/L (ref 135–145)

## 2010-09-13 LAB — PROTIME-INR
INR: 5.76 (ref 0.00–1.49)
Prothrombin Time: 51.5 seconds — ABNORMAL HIGH (ref 11.6–15.2)

## 2010-09-13 LAB — POCT CARDIAC MARKERS
CKMB, poc: 5.4 ng/mL (ref 1.0–8.0)
Myoglobin, poc: >500 ng/mL (ref 12–200)
Troponin i, poc: <0.05 ng/mL (ref 0.00–0.09)

## 2010-09-13 LAB — BRAIN NATRIURETIC PEPTIDE: Pro B Natriuretic peptide (BNP): 65.8 pg/mL (ref 0.0–100.0)

## 2010-09-13 LAB — DIFFERENTIAL
Basophils Absolute: 0.1 10*3/uL (ref 0.0–0.1)
Basophils Relative: 1 % (ref 0–1)
Eosinophils Absolute: 0.4 10*3/uL (ref 0.0–0.7)
Eosinophils Relative: 3 % (ref 0–5)
Lymphocytes Relative: 36 % (ref 12–46)
Lymphs Abs: 4.1 10*3/uL — ABNORMAL HIGH (ref 0.7–4.0)
Monocytes Absolute: 0.9 10*3/uL (ref 0.1–1.0)
Monocytes Relative: 8 % (ref 3–12)
Neutro Abs: 6 10*3/uL (ref 1.7–7.7)
Neutrophils Relative %: 53 % (ref 43–77)

## 2010-09-13 LAB — GLUCOSE, CAPILLARY
Glucose-Capillary: 151 mg/dL — ABNORMAL HIGH (ref 70–99)
Glucose-Capillary: 158 mg/dL — ABNORMAL HIGH (ref 70–99)

## 2010-09-21 ENCOUNTER — Encounter: Payer: Self-pay | Admitting: Cardiology

## 2010-09-23 ENCOUNTER — Encounter: Payer: Self-pay | Admitting: Cardiology

## 2010-09-23 LAB — CBC
HCT: 36 % (ref 36.0–46.0)
HCT: 34.4 % — ABNORMAL LOW (ref 36.0–46.0)
HCT: 33.8 % — ABNORMAL LOW (ref 36.0–46.0)
Hemoglobin: 11.4 g/dL — ABNORMAL LOW (ref 12.0–15.0)
Hemoglobin: 10.9 g/dL — ABNORMAL LOW (ref 12.0–15.0)
Hemoglobin: 11 g/dL — ABNORMAL LOW (ref 12.0–15.0)
MCH: 28.4 pg (ref 26.0–34.0)
MCH: 28.2 pg (ref 26.0–34.0)
MCH: 29 pg (ref 26.0–34.0)
MCHC: 31.7 g/dL (ref 30.0–36.0)
MCHC: 31.7 g/dL (ref 30.0–36.0)
MCHC: 32.5 g/dL (ref 30.0–36.0)
MCV: 89.6 fL (ref 78.0–100.0)
MCV: 89.1 fL (ref 78.0–100.0)
MCV: 89.2 fL (ref 78.0–100.0)
Platelets: 397 10*3/uL (ref 150–400)
Platelets: 351 10*3/uL (ref 150–400)
Platelets: 282 10*3/uL (ref 150–400)
RBC: 4.02 MIL/uL (ref 3.87–5.11)
RBC: 3.86 MIL/uL — ABNORMAL LOW (ref 3.87–5.11)
RBC: 3.79 MIL/uL — ABNORMAL LOW (ref 3.87–5.11)
RDW: 13.9 % (ref 11.5–15.5)
RDW: 13.9 % (ref 11.5–15.5)
RDW: 13.8 % (ref 11.5–15.5)
WBC: 9.3 10*3/uL (ref 4.0–10.5)
WBC: 10.7 10*3/uL — ABNORMAL HIGH (ref 4.0–10.5)
WBC: 8.9 10*3/uL (ref 4.0–10.5)

## 2010-09-23 LAB — BASIC METABOLIC PANEL
BUN: 54 mg/dL — ABNORMAL HIGH (ref 6–23)
BUN: 35 mg/dL — ABNORMAL HIGH (ref 6–23)
BUN: 41 mg/dL — ABNORMAL HIGH (ref 6–23)
BUN: 30 mg/dL — ABNORMAL HIGH (ref 6–23)
BUN: 28 mg/dL — ABNORMAL HIGH (ref 6–23)
CO2: 28 mEq/L (ref 19–32)
CO2: 31 mEq/L (ref 19–32)
CO2: 33 mEq/L — ABNORMAL HIGH (ref 19–32)
CO2: 32 mEq/L (ref 19–32)
CO2: 33 mEq/L — ABNORMAL HIGH (ref 19–32)
Calcium: 8.6 mg/dL (ref 8.4–10.5)
Calcium: 8.9 mg/dL (ref 8.4–10.5)
Calcium: 8.9 mg/dL (ref 8.4–10.5)
Calcium: 9.1 mg/dL (ref 8.4–10.5)
Calcium: 9.3 mg/dL (ref 8.4–10.5)
Chloride: 98 mEq/L (ref 96–112)
Chloride: 95 mEq/L — ABNORMAL LOW (ref 96–112)
Chloride: 98 mEq/L (ref 96–112)
Chloride: 99 mEq/L (ref 96–112)
Chloride: 101 mEq/L (ref 96–112)
Creatinine, Ser: 1.8 mg/dL — ABNORMAL HIGH (ref 0.4–1.2)
Creatinine, Ser: 1.51 mg/dL — ABNORMAL HIGH (ref 0.4–1.2)
Creatinine, Ser: 1.52 mg/dL — ABNORMAL HIGH (ref 0.4–1.2)
Creatinine, Ser: 1.34 mg/dL — ABNORMAL HIGH (ref 0.4–1.2)
Creatinine, Ser: 1.38 mg/dL — ABNORMAL HIGH (ref 0.4–1.2)
GFR calc Af Amer: 33 mL/min — ABNORMAL LOW (ref >60)
GFR calc Af Amer: 41 mL/min — ABNORMAL LOW (ref >60)
GFR calc Af Amer: 40 mL/min — ABNORMAL LOW (ref >60)
GFR calc Af Amer: 47 mL/min — ABNORMAL LOW (ref >60)
GFR calc Af Amer: 45 mL/min — ABNORMAL LOW (ref >60)
GFR calc non Af Amer: 27 mL/min — ABNORMAL LOW (ref >60)
GFR calc non Af Amer: 34 mL/min — ABNORMAL LOW (ref >60)
GFR calc non Af Amer: 33 mL/min — ABNORMAL LOW (ref >60)
GFR calc non Af Amer: 38 mL/min — ABNORMAL LOW (ref >60)
GFR calc non Af Amer: 37 mL/min — ABNORMAL LOW (ref >60)
Glucose, Bld: 215 mg/dL — ABNORMAL HIGH (ref 70–99)
Glucose, Bld: 346 mg/dL — ABNORMAL HIGH (ref 70–99)
Glucose, Bld: 175 mg/dL — ABNORMAL HIGH (ref 70–99)
Glucose, Bld: 156 mg/dL — ABNORMAL HIGH (ref 70–99)
Glucose, Bld: 173 mg/dL — ABNORMAL HIGH (ref 70–99)
Potassium: 3.6 mEq/L (ref 3.5–5.1)
Potassium: 4.5 mEq/L (ref 3.5–5.1)
Potassium: 3.9 mEq/L (ref 3.5–5.1)
Potassium: 4.8 mEq/L (ref 3.5–5.1)
Potassium: 4.4 mEq/L (ref 3.5–5.1)
Sodium: 133 mEq/L — ABNORMAL LOW (ref 135–145)
Sodium: 133 mEq/L — ABNORMAL LOW (ref 135–145)
Sodium: 138 mEq/L (ref 135–145)
Sodium: 138 mEq/L (ref 135–145)
Sodium: 139 mEq/L (ref 135–145)

## 2010-09-23 LAB — GLUCOSE, CAPILLARY
Glucose-Capillary: 253 mg/dL — ABNORMAL HIGH (ref 70–99)
Glucose-Capillary: 296 mg/dL — ABNORMAL HIGH (ref 70–99)
Glucose-Capillary: 165 mg/dL — ABNORMAL HIGH (ref 70–99)
Glucose-Capillary: 209 mg/dL — ABNORMAL HIGH (ref 70–99)
Glucose-Capillary: 183 mg/dL — ABNORMAL HIGH (ref 70–99)
Glucose-Capillary: 152 mg/dL — ABNORMAL HIGH (ref 70–99)
Glucose-Capillary: 19 mg/dL — CL (ref 70–99)
Glucose-Capillary: 177 mg/dL — ABNORMAL HIGH (ref 70–99)
Glucose-Capillary: 160 mg/dL — ABNORMAL HIGH (ref 70–99)
Glucose-Capillary: 287 mg/dL — ABNORMAL HIGH (ref 70–99)
Glucose-Capillary: 161 mg/dL — ABNORMAL HIGH (ref 70–99)
Glucose-Capillary: 173 mg/dL — ABNORMAL HIGH (ref 70–99)
Glucose-Capillary: 164 mg/dL — ABNORMAL HIGH (ref 70–99)
Glucose-Capillary: 231 mg/dL — ABNORMAL HIGH (ref 70–99)
Glucose-Capillary: 186 mg/dL — ABNORMAL HIGH (ref 70–99)
Glucose-Capillary: 269 mg/dL — ABNORMAL HIGH (ref 70–99)
Glucose-Capillary: 211 mg/dL — ABNORMAL HIGH (ref 70–99)
Glucose-Capillary: 318 mg/dL — ABNORMAL HIGH (ref 70–99)
Glucose-Capillary: 102 mg/dL — ABNORMAL HIGH (ref 70–99)
Glucose-Capillary: 151 mg/dL — ABNORMAL HIGH (ref 70–99)
Glucose-Capillary: 140 mg/dL — ABNORMAL HIGH (ref 70–99)
Glucose-Capillary: 326 mg/dL — ABNORMAL HIGH (ref 70–99)
Glucose-Capillary: 111 mg/dL — ABNORMAL HIGH (ref 70–99)
Glucose-Capillary: 59 mg/dL — ABNORMAL LOW (ref 70–99)
Glucose-Capillary: 108 mg/dL — ABNORMAL HIGH (ref 70–99)
Glucose-Capillary: 187 mg/dL — ABNORMAL HIGH (ref 70–99)
Glucose-Capillary: 281 mg/dL — ABNORMAL HIGH (ref 70–99)
Glucose-Capillary: 135 mg/dL — ABNORMAL HIGH (ref 70–99)
Glucose-Capillary: 144 mg/dL — ABNORMAL HIGH (ref 70–99)
Glucose-Capillary: 169 mg/dL — ABNORMAL HIGH (ref 70–99)
Glucose-Capillary: 220 mg/dL — ABNORMAL HIGH (ref 70–99)
Glucose-Capillary: 174 mg/dL — ABNORMAL HIGH (ref 70–99)
Glucose-Capillary: 141 mg/dL — ABNORMAL HIGH (ref 70–99)
Glucose-Capillary: 82 mg/dL (ref 70–99)
Glucose-Capillary: 374 mg/dL — ABNORMAL HIGH (ref 70–99)
Glucose-Capillary: 80 mg/dL (ref 70–99)
Glucose-Capillary: 183 mg/dL — ABNORMAL HIGH (ref 70–99)
Glucose-Capillary: 167 mg/dL — ABNORMAL HIGH (ref 70–99)
Glucose-Capillary: 348 mg/dL — ABNORMAL HIGH (ref 70–99)
Glucose-Capillary: 132 mg/dL — ABNORMAL HIGH (ref 70–99)
Glucose-Capillary: 105 mg/dL — ABNORMAL HIGH (ref 70–99)

## 2010-09-23 LAB — PROTIME-INR
INR: 5.95 (ref 0.00–1.49)
INR: 5.05 (ref 0.00–1.49)
INR: 4.87 — ABNORMAL HIGH (ref 0.00–1.49)
INR: 3.32 — ABNORMAL HIGH (ref 0.00–1.49)
INR: 2.61 — ABNORMAL HIGH (ref 0.00–1.49)
INR: 2.16 — ABNORMAL HIGH (ref 0.00–1.49)
INR: 2.08 — ABNORMAL HIGH (ref 0.00–1.49)
INR: 2 — ABNORMAL HIGH (ref 0.00–1.49)
INR: 2.13 — ABNORMAL HIGH (ref 0.00–1.49)
Prothrombin Time: 52.8 seconds — ABNORMAL HIGH (ref 11.6–15.2)
Prothrombin Time: 46.6 seconds — ABNORMAL HIGH (ref 11.6–15.2)
Prothrombin Time: 45.3 seconds — ABNORMAL HIGH (ref 11.6–15.2)
Prothrombin Time: 33.7 seconds — ABNORMAL HIGH (ref 11.6–15.2)
Prothrombin Time: 28 seconds — ABNORMAL HIGH (ref 11.6–15.2)
Prothrombin Time: 24.2 seconds — ABNORMAL HIGH (ref 11.6–15.2)
Prothrombin Time: 23.5 seconds — ABNORMAL HIGH (ref 11.6–15.2)
Prothrombin Time: 22.8 seconds — ABNORMAL HIGH (ref 11.6–15.2)
Prothrombin Time: 24 seconds — ABNORMAL HIGH (ref 11.6–15.2)

## 2010-09-23 LAB — CARDIAC PANEL(CRET KIN+CKTOT+MB+TROPI)
CK, MB: 6.3 ng/mL (ref 0.3–4.0)
CK, MB: 3 ng/mL (ref 0.3–4.0)
Relative Index: 2.4 (ref 0.0–2.5)
Relative Index: 1.3 (ref 0.0–2.5)
Total CK: 258 U/L — ABNORMAL HIGH (ref 7–177)
Total CK: 235 U/L — ABNORMAL HIGH (ref 7–177)
Troponin I: 0.04 ng/mL (ref 0.00–0.06)
Troponin I: 0.04 ng/mL (ref 0.00–0.06)

## 2010-09-23 LAB — BRAIN NATRIURETIC PEPTIDE: Pro B Natriuretic peptide (BNP): 158 pg/mL — ABNORMAL HIGH (ref 0.0–100.0)

## 2010-09-23 LAB — MAGNESIUM: Magnesium: 1.9 mg/dL (ref 1.5–2.5)

## 2010-09-25 ENCOUNTER — Encounter: Payer: Self-pay | Admitting: Cardiology

## 2010-09-25 LAB — PROTIME-INR
INR: 2.63 — ABNORMAL HIGH (ref 0.00–1.49)
Prothrombin Time: 28.2 seconds — ABNORMAL HIGH (ref 11.6–15.2)

## 2010-09-25 LAB — GLUCOSE, CAPILLARY
Glucose-Capillary: 188 mg/dL — ABNORMAL HIGH (ref 70–99)
Glucose-Capillary: 270 mg/dL — ABNORMAL HIGH (ref 70–99)

## 2010-09-27 ENCOUNTER — Ambulatory Visit: Admission: RE | Admit: 2010-09-27 | Discharge: 2010-09-27 | Payer: Self-pay | Source: Home / Self Care

## 2010-09-28 NOTE — H&P (Signed)
NAME:  Brittney [name], BUESO NO.:  [id number]  MEDICAL RECORD NO.:  [id number]          PATIENT TYPE:  INP  LOCATION:  3739                         FACILITY:  [facility]  PHYSICIAN:  [name], MD, FACCDATE OF BIRTH:  [birth year]  DATE OF ADMISSION:  09/13/2010 DATE OF DISCHARGE:                             HISTORY & PHYSICAL   PRIMARY CARDIOLOGIST:  [name], MD, [facility]  PRIMARY CARE PHYSICIAN:  [name], MD  CHIEF COMPLAINT:  Syncope.  HISTORY OF PRESENT ILLNESS:  Ms. Ivie is a 77 year old Caucasian female with a known history of chronic systolic congestive heart failure secondary to a nonischemic/tachy-mediated cardiomyopathy with last EF 25- 30% per transesophageal echocardiogram completed last month, paroxysmal atrial fibrillation, anticoagulant on Coumadin with amiodarone antiarrhythmic therapy as well as rate control with Toprol, history of sick sinus syndrome S/P St. Jude Accent PPM (DVR mode), history of TIAs, history of recurrent pneumonias, nonobstructive CAD per cath August 2010, and CKD stage III-IV as well as hypothyroidism, who presents with syncope earlier this morning.  The patient was in her usual state of health after being discharged from the hospital [facility]) for acute on chronic systolic congestive heart failure where she underwent diuresis and was instructed to decrease her fluid intake.  She feels that her weight has been fairly stable since discharge and she has been adhering to her medication regimen as well as decreased fluid intake per instructions.  She feels that her weight has been stable although she admits her daughter helps her with this, and she is not exactly sure.  Also, adherent to low-sodium diet. Unfortunately, this morning, she used the restroom (micturition) and stood up and felt significant presyncope and therefore sat down, started feeling better.  She felt well enough after a couple of minutes,  stand backed up, and she washed her hands, but unfortunately next thing she knew she was waking up on the floor with significant facial pain and mild bleeding.  The patient was subsequently taken to [facility] where it was noted on CT of the face that she had a nose fracture, but no significant intracranial bleeding or other findings other than a hematoma on the right scalp.  She denies any chest discomfort prior to during or after this episode.  She had no palpitations, no shortness of breath recently or during this episode. No orthopnea recently or lower extremity edema.  No fevers/chills, nausea/vomiting, cough, or any other recent changes.  She does report mechanical fall 2 weeks ago and another mechanical fall 3 weeks before that.  No significant traumas with those falls.  [facility] discussed the situation given her significant cardiac history.  She was transferred to [facility] for further evaluation.  It is noteworthy that the patient has no significant EKG changes and labs remarkable onlyfor BNP at 65.8, creatinine 2.32 up from 1.84 on September 06, 2010, and INR significantly supratherapeutic at 5.76.  Currently, the patient's blood pressure is running slightly low in 110s, but no orthostatics currently available.  The patient's device has been interrogated and there have been no atrial fibrillation  episodes, no high ventricular rates, and no changes were felt to be necessary.  Currently, the patient only complained of headache and being hungry as she has not eaten anything in this morning.  PAST MEDICAL HISTORY: 1. Chronic systolic congestive heart failure secondary to     nonischemic/tachy-mediated cardiomyopathy.     a.     TEE August 14, 2010:  EF 25-30% with diffuse hypokinesis. 2. Atrial fibrillation/flutter.     a.     Anticoagulated on Coumadin, antiarrhythmic therapy      amiodarone 200 mg p.o. daily, rate control Toprol-XL 25 mg p.o.       daily.     b.     S/P DCCV August 15, 2010. 3. History of sick sinus syndrome.     a.     S/P St. Jude Accent PPM, DVR mode, June 01, 2009. 4. History of TIAs. 5. PAD.     a.     S/P right carotid endarterectomy.     b.     Stents to right radial, right superficial, and right      popliteal arteries. 6. CKD, stage III-IV. 7. Hypothyroidism. 8. Hypertension. 9. Dyslipidemia. 10.Anemia of chronic disease. 11.Vertebrobasilar insufficiency.  PAST SURGICAL HISTORY: 1. Total abdominal hysterectomy. 2. Cholecystectomy. 3. Right carotid endarterectomy. 4. Right mid foot amputation, 2010. 5. Tonsillectomy. 6. Several eye surgeries. 7. PCI/stenting to the right radial, right superficial, as well as     right popliteal arteries, Dr. Durwin Nora. 8. St. Jude pacemaker implantation, September 2010.  SOCIAL HISTORY:  The patient lives in [place] with her family.  No tobacco, EtOH, or illicit drug uses.  She states she follows heart-healthy/low- sodium diet with no regular exercise.  FAMILY HISTORY:  Hypertension but negative for premature diagnosis of coronary artery disease.  REVIEW OF SYSTEMS:  Please see HPI.  All other systems were reviewed and were negative.  CODE STATUS:  Full.  ALLERGIES: 1. MORPHINE SULFATE. 2. OXYCODONE.  MEDICATIONS: 1. Amiodarone 200 mg p.o. daily. 2. Aldactone 25 mg p.o. daily. 3. Furosemide 40 mg p.o. b.i.d. 4. Enteric-coated aspirin 81 mg p.o. daily. 5. Coumadin as directed. 6. Exelon patch 4.6 mg daily. 7. Glipizide XL 5 mg p.o. b.i.d. 8. Levothyroxine 75 mcg daily. 9. Lisinopril 5 mg p.o. daily. 10.Lantus insulin 15 units subcu injection b.i.d. 11.Sublingual nitroglycerin p.r.n. for chest discomfort. 12.Sertraline 100 mg p.o. daily. 13.Coreg 12.5 mg p.o. b.i.d. 14.Acetaminophen 500 mg 1-2 tabs p.o. q.4 hours p.r.n. 15.Travatan ophthalmic drops 1 GTCs OU daily. 16.Vitamin B6 100 mg p.o. daily. 17.Vitamin C 1 g p.o. daily.  PHYSICAL  EXAMINATION:  VITAL SIGNS:  Currently within normal limits and stable.  No orthostatics yet available.  At [facility], temp 98.1 degrees Fahrenheit with BP 116/46, pulse 62, respiration rate 20. Here, the patient's O2 saturation is 94% on 2 liters by nasal cannula. GENERAL:  The patient is alert and oriented x3, in no apparent distress. She is able to speak easily in full sentences without respiratory distress. HEENT:  Her facial trauma is immediately evident with significant medial right orbital ecchymosis as well as swelling of the nose bilaterally. No evidence of ongoing bleeding, but scabbed over wound on the right distal nose.  Otherwise, head is normocephalic.  Pupils equal, round, and reactive to light.  Extraocular muscles are intact.  Nares are patent without discharge.  Oropharynx without erythema or exudates. NECK:  Supple without lymphadenopathy.  No thyromegaly and no JVD. LUNGS:  Clear to auscultation bilaterally.  HEART:  Heart rate is regular with audible S1 and S2.  No clicks, rubs, murmurs, or gallops.  Pulses are 2+ and equal in upper extremities bilaterally, 1+ and equal in lower extremities bilaterally. SKIN:  No rashes, lesions, or petechiae. ABDOMEN:  Soft, nontender, nondistended.  Normal abdominal bowel sounds. No rebound or guarding. MUSCULOSKELETAL:  No joint deformity or effusions.  No spinal or CVA tenderness. EXTREMITIES:  Without clubbing, cyanosis, or edema. NEUROLOGIC:  Cranial nerves II-XII grossly intact with strength 5/5 in all extremities and axial groups.  No focal deficits and normal sensation and normal cerebellar function (as assessed in the hospital bed only).  LABS:  INR supratherapeutic at 5.76.  WBC 11.4, HGB 12.0, HCT 35.0, PLT count 450.  Sodium 139, potassium 4.1, chloride 99, bicarb 32, BUN 34, creatinine 2.32, glucose 124.  BNP 65.8.  No creatinine up from 1.4 on September 06, 2010, up from prior baseline of  1.5.  RADIOLOGY: 1. CT of the head was negative.  CT of the maxilla facial bone shows     fracture of the right side of the nasal bone.  CT of the neck was     negative for trauma. 2. EKG, normal sinus rhythm without significant ST-T wave changes nor     significant Q-waves, normal axis, PR 182, QRS 102, and QTc 466 when     compared to prior tracing from August 10, 2010, QTc is slightly     more prolonged and rate and rhythm have changed (the patient's     atrial fibrillation in the low 100s at that time).  The patient was initially seen by Lenard Galloway, PA-C, and discussed but yet to be seen and thoroughly assessed by attending cardiologist, Dr. Willa Rough.  Please see his addendum for any significant changes to his assessment and plan.  Ms. Neglia is a 78 year old Caucasian female with the above-noted complex medical history including chronic systolic congestive heart failure secondary to nonischemic/tachy-mediated cardiomyopathy, last EF 25-30% per TEE last month, PAF on amiodarone, Toprol, and anticoagulated on Coumadin, and history of sick sinus syndrome status post St. Jude Accent PPM September 2010, history of TIAs, PAD with right carotid endarterectomy and right lower extremity stenting as above, anemia of chronic disease, hypertension, dyslipidemia, hypothyroidism, and CKD stage III-IV, who presents with an episode of syncope, consistent with orthostasis in the setting of recent discharge from the hospital after IV diuresis and instructions which were closely followed to decrease p.o. intake including strict fluids.  1. Syncope - Creatinine is up and history consistent with orthostatic     syncope.  We will check orthostatics.  It is noteworthy the     patient's device shows no arrhythmias or significant tachycardia.     We will hold ACE inhibitor and diuretics for 24 hours, encourage     increased p.o. intake and review orthostatics when available. 2. Facial trauma.   It does not appear severe enough to warrant further     therapy, but we will watch closely, especially in the setting of     supratherapeutic INR. 3. Paroxysmal atrial fibrillation - we will continue current     medications except for Coumadin in the setting of supratherapeutic     INR.  We will need to consider whether the patient is still a good     Coumadin candidate in light of her recent trauma as well as recent     falls as described in HPI.     Jarrett Ables,  PAC   ______________________________ [name], MD, [facility]    MS/MEDQ  D:  09/13/2010  T:  09/14/2010  Job:  [id number]  Electronically Signed by Jarrett Ables PAC on 09/18/2010 03:36:11 PM Electronically Signed by Willa Rough MD FACC on 09/28/2010 09:10:33 AM

## 2010-09-28 NOTE — H&P (Signed)
NAME:  Brittney [name], Brittney [name] NO.:  [id number]  MEDICAL RECORD NO.:  [id number]          PATIENT TYPE:  EMS  LOCATION:  ED                            FACILITY:  [facility]  PHYSICIAN:  Osvaldo Shipper, MD     DATE OF BIRTH:  30-Sep-1931  DATE OF ADMISSION:  08/31/2010 DATE OF DISCHARGE:  LH                             HISTORY & PHYSICAL   PRIMARY CARE PHYSICIAN:  [name], MD  CARDIOLOGIST:  [name], MD with [facility]  ADMISSION DIAGNOSES: 1. Acute systolic congestive heart failure exacerbation. 2. History of atrial fibrillation with a recent cardioversion 3. On long-term anticoagulation. 4. Diabetes on insulin. 5. History of coronary artery disease. 6. History of peripheral vascular disease. 7. Unsteady gait and recent fall.  CHIEF COMPLAINT:  Shortness of breath since last night.  HISTORY OF PRESENT ILLNESS:  The patient is a 77 year old Caucasian female who has a history of CAD, CHF, AFib, pacemaker who was discharged from [facility] on December 9.  She underwent cardioversion at that time for her rapid AFib.  The patient tells me that she was in a usual state of health till last night, when she started developing shortness of breath which progressively worsened throughout the night. She had orthopnea and PND.  She was up a lot, could not sleep.  She had a dry cough.  According to her daughter, she has been having fever from 99 to 101 every day for the last 3 days.  She fell a few days ago as well.  She also has back pain ever since her fall.  She has a bruise on her back from the fall as well.  She has some leg swelling on and off as well.  Denies any sick contacts.  Denies any chills.  Denies any headache.  MEDICATIONS AT HOME:  This is gleaned from previous notes. Unfortunately, the patient did not bring in her home medications. 1. Nitroglycerin sublingually as needed for chest pain. 2. Sertraline 100 mg p.o. daily. 3. Glipizide 5 mg p.o.  b.i.d. 4. Vitamin C 1000 mg p.o. daily. 5. Vitamin B6 100 mg p.o. daily. 6. Lantus 10 units every morning, 25 units at bedtime. 7. Levothyroxine 75 mcg daily. 8. Warfarin as directed. 9. Travatan 1 drop both eyes daily. 10.Exelon patch 4.6 daily. 11.Lasix 20 mg p.o. b.i.d. 12.Amiodarone 200 mg p.o. daily. 13.Aspirin 81 mg daily. 14.Lisinopril 5 mg daily. 15.Toprol XL 25 mg daily. 16.She is on home O2 as well. 17.According to the daughter, she was started on diltiazem 180 mg     daily recently, although this is not mentioned in the Cardiology     notes from December 15.  ALLERGIES:  Include MORPHINE AND CODEINE.  PAST MEDICAL HISTORY:  Positive for CHF.  Her EF based on a TEE on December 7 is 25 to 30% with hypokinesis of the anterior myocardium.  No vegetation was noted.  Cardioversion was performed during that procedure.  She also has diabetes on insulin, hypertension, history of MI, peripheral vascular disease.  She is status post right CEA.  She has stents in the renal  artery.  She has stents in her legs, in the vasculature to her lower extremity as well.  Her surgical history also includes cholecystectomy, hysterectomy, right forefoot amputation and tonsillectomy and appendectomy.  She has a pacemaker as well.  SOCIAL HISTORY:  She lives in [place] with her family.  No smoking, alcohol or illicit drug use.  Ever since her discharge on December 9 from [facility], she has not been ambulating much.  She has been very weak according to the daughter and had fallen on Monday, and has been having unsteady gait.  FAMILY HISTORY:  Is positive for hypertension, brain tumor, heart disease, cancer, diabetes.  REVIEW OF SYSTEMS:  CONSTITUTIONAL:  Positive for weakness, malaise. HEENT: Unremarkable.  CARDIOVASCULAR:  As in HPI.  RESPIRATORY:  As in HPI.  GI: Unremarkable.  GU: Unremarkable.  NEUROLOGICAL:  Unremarkable. PSYCHIATRIC:  Unremarkable.  DERMATOLOGIC:  Unremarkable.  Other  systems reviewed and found to be negative.  PHYSICAL EXAMINATION:  VITAL SIGNS: Temperature here was 100.4 rectally, blood pressure 161/68, heart rate 69, respiratory rate 16, saturation 95% on 2 L by nasal cannula. GENERAL APPEARANCE:  This is an overweight white female in no distress. HEENT:  Head is normocephalic, atraumatic.  Pupils are equal reacting. No pallor, no icterus.  Oral mucous membranes are moist.  No oral lesions are noted. NECK:  Soft and supple.  No thyromegaly is appreciated. LYMPHATICS:  No cervical, supraclavicular, or inguinal lymphadenopathy is present. LUNGS:  Reveal crackles bilateral bases about one-third of the lung fields.  No wheezing is present. CARDIOVASCULAR:  S1, S2 is normal regular.  No S3, S4, nor rubs, murmurs or bruits.  I do not appreciate any JVD.  There is 1+ pitting edema bilateral lower extremities. ABDOMEN:  Soft, nontender, nondistended.  Bowel sounds are present.  No masses or organomegaly is appreciated. GU:  Deferred. MUSCULOSKELETAL:  Normal muscle mass and tone. NEUROLOGIC:  Neurologically, she is alert, oriented x3.  No focal neurologic deficits are present.  Gait was not assessed as a part of the neurological examination at this time. BACK:  Examination of the back reveals a bruise in the lower back. There is no point tenderness over her spine at this time.  EKG shows paced rhythm appears to be regular.  No acute ischemic changes are noted.  Chest x-ray was done which revealed moderate congestive heart failure.  Labs were done which showed white cell count 11.9, normal differential. Hemoglobin is 9.4, MCV is 87, platelet count is 236.  INR is 3.29, potassium is 2.6.  Renal function is normal.  GFR is low at 49.  Cardiac enzymes negative.  BNP is 943.  ASSESSMENT:  This is a 77 year old Caucasian female who presents with worsening shortness of breath and has evidence for acute systolic congestive heart failure  exacerbation.  PLAN: 1. Acute systolic CHF exacerbation will be treated with IV Lasix and     nitro paste.  Continue with ACE inhibitor, beta blocker.  We will     hold off on the diltiazem for now.  When she is better, this may be     resumed in the morning.  I's and O's will be obtained.  Foley     catheter has been placed because of intravenous diuretics.  Daily     weights will be checked as well.  I am hoping once she is     adequately diuresed, she may be able to go home within the next     couple of  days. 2. Unsteady gait with a recent fall.  I want to make sure there is     nothing intracranial, so will get a CT head.  PT, OT will be     ordered as well.  This may just be deconditioning from her recent     hospitalization and acute illness. 3. Severe hypokalemia.  Will be repleted aggressively.  Magnesium     level will be checked. 4. History of CAD.  Continue with her beta-blocker, her aspirin for     now. 5. AFib.  Continue with amiodarone, beta blocker and warfarin per     Pharmacy. 6. History of hypothyroidism.  Continue with Synthroid. 7. Diabetes.  Continue with Lantus sliding scale.  Check CBGs q.a.c.     and h.s.  She is a full code.  Further management decisions will depend on results of further testing and patient's response to treatment.  Also please note that for her low-grade fever and cough, we will put her on Avelox for now.  UA will be checked to make sure there is no infection.    Osvaldo Shipper, MD     GK/MEDQ  D:  08/31/2010  T:  08/31/2010  Job:  [id number]  cc:   [name], M.D. Fax: [phone]  Bevelyn Buckles. Bensimhon, MD [address], [place] [address]  [name], MD, [facility] [address]  [address] [place] [address]  Electronically Signed by Osvaldo Shipper MD on 09/28/2010 06:45:26 AM

## 2010-09-29 ENCOUNTER — Encounter: Payer: Self-pay | Admitting: Nephrology

## 2010-10-01 ENCOUNTER — Ambulatory Visit: Admit: 2010-10-01 | Payer: Self-pay | Admitting: Physician Assistant

## 2010-10-02 ENCOUNTER — Ambulatory Visit
Admission: RE | Admit: 2010-10-02 | Discharge: 2010-10-02 | Payer: Self-pay | Source: Home / Self Care | Attending: Cardiology | Admitting: Cardiology

## 2010-10-02 ENCOUNTER — Encounter: Payer: Self-pay | Admitting: Cardiology

## 2010-10-06 LAB — CONVERTED CEMR LAB
POC INR: 1.4
POC INR: 2.3

## 2010-10-10 NOTE — Medication Information (Signed)
Summary: ccr-lr  Anticoagulant Therapy  Managed by: Vashti Hey RN PCP: [name], MD Supervising MD: Andee Lineman MD, Michelle Piper Indication 1: Atrial Fibrillation Lab Used: [facility] [place] Site: [place] INR POC 2.1  Dietary changes: no    Health status changes: no    Bleeding/hemorrhagic complications: no    Recent/future hospitalizations: no    Any changes in medication regimen? no    Recent/future dental: no  Any missed doses?: no       Is patient compliant with meds? yes       Allergies: 1)  ! Oxycodone Hcl 2)  Morphine Sulfate (Morphine Sulfate)  Anticoagulation Management History:      The patient is taking warfarin and comes in today for a routine follow up visit.  She is being anticoagulated because of atrial fibrillation requiring cardioversion.  Positive risk factors for bleeding include an age of 75 years or older, history of CVA/TIA, and presence of serious comorbidities.  Negative risk factors for bleeding include no history of GI bleeding.  The bleeding index is 'high risk'.  Positive CHADS2 values include History of HTN, Age > 59 years old, History of Diabetes, and Prior Stroke/CVA/TIA.  Negative CHADS2 values include History of CHF.  The start date was 04/29/2009.  Her last INR was 2.02.  Anticoagulation responsible provider: Andee Lineman MD, Michelle Piper.  INR POC: 2.1.  Cuvette Lot#: [id number].  Exp: 10/11.    Anticoagulation Management Assessment/Plan:      The patient's current anticoagulation dose is Coumadin 2.5 mg tabs: use as directed per Anticoaguation Clinic.  The target INR is 2.0-3.0.  The next INR is due 06/28/2010.  Anticoagulation instructions were given to patient.  Results were reviewed/authorized by Vashti Hey RN.  She was notified by Vashti Hey RN.         Prior Anticoagulation Instructions: INR 2.4 Continue coumadin 2.5mg  once daily   Current Anticoagulation Instructions: INR 2.1 Continue coumadin 2.5mg  once daily

## 2010-10-10 NOTE — Letter (Signed)
Summary: Engineer, materials at [facility]  [address]   [place], [place] [address]   Phone: [phone]  Fax: [phone]        February 20, 2010 MRN: [id number]    [name], [place]  [address]    Dear [name],  Your test ordered by Selena Batten has been reviewed by your physician (or physician assistant) and was found to be normal or stable. Your physician (or physician assistant) felt no changes were needed at this time.  ____ Echocardiogram  ____ Cardiac Stress Test  __X__ Lab Work  ____ Peripheral vascular study of arms, legs or neck  ____ CT scan or X-ray  ____ Lung or Breathing test  ____ Other:   Thank you.   Cyril Loosen, RN, BSN    Duane Boston, M.D., F.A.C.C. Thressa Sheller, M.D., F.A.C.C. Oneal Grout, M.D., F.A.C.C. Cheree Ditto, M.D., F.A.C.C. Daiva Nakayama, M.D., F.A.C.C. Kenney Houseman, M.D., F.A.C.C. Jeanne Ivan, PA-C

## 2010-10-10 NOTE — Letter (Signed)
Summary: Recall, Labs Needed  [facility]  [address]   [place], [place] [address]   Phone: [phone]  Fax: [phone]    December 05, 2009  Brittney [name] [address] [place]  [address] [birth year]   Dear [name],   Our records indicate it is time to repeat your blood work.  You can take the enclosed form to the lab on or near the date indicated.  Please make note of the new location of the lab:   [address], 2nd floor   [facility]  Our office will call you within a week to ten business days with the results.  If you do not hear from [place] in 10 business days, you should call the office.  If you have any questions regarding this, call the office at [phone], and ask for the nurse.  Labs are due on : 01/03/2010.   Sincerely,    Hendricks Limes LPN  [facility] Ph: [phone]   Fax: [phone]

## 2010-10-10 NOTE — Medication Information (Signed)
Summary: ccr-lr  Anticoagulant Therapy  Managed by: Vashti Hey, RN PCP: Loleta Rose Supervising MD: Diona Browner MD, Remi Deter Indication 1: Atrial Fibrillation Lab Used: [facility] [place] Site: [place] INR POC 2.1  Dietary changes: no    Health status changes: no    Bleeding/hemorrhagic complications: no    Recent/future hospitalizations: no    Any changes in medication regimen? no    Recent/future dental: no  Any missed doses?: no       Is patient compliant with meds? yes       Allergies: 1)  ! Oxycodone Hcl 2)  Morphine Sulfate (Morphine Sulfate)  Anticoagulation Management History:      The patient is taking warfarin and comes in today for a routine follow up visit.  Warfarin therapy is being given due to atrial fibrillation requiring cardioversion.  Positive risk factors for bleeding include an age of 75 years or older, history of CVA/TIA, and presence of serious comorbidities.  Negative risk factors for bleeding include no history of GI bleeding.  The bleeding index is 'high risk'.  Positive CHADS2 values include History of HTN, Age > 66 years old, History of Diabetes, and Prior Stroke/CVA/TIA.  Negative CHADS2 values include History of CHF.  The start date was 04/29/2009.  Her last INR was 2.02.  Anticoagulation responsible provider: Diona Browner MD, Remi Deter.  INR POC: 2.1.  Cuvette Lot#: [id number].  Exp: 10/11.    Anticoagulation Management Assessment/Plan:      The patient's current anticoagulation dose is Warfarin sodium 5 mg tabs: use as directed.  The target INR is 2.0-3.0.  The next INR is due 08/09/2010.  Anticoagulation instructions were given to patient.  Results were reviewed/authorized by Vashti Hey, RN.  She was notified by Vashti Hey RN.         Prior Anticoagulation Instructions: INR 1.7 Increase coumadin to 2.5mg  once daily except 5mg  on Tuesdays  Current Anticoagulation Instructions: INR 2.1  (1st theraputic) Continue coumadin 2.5mg  once daily  except 5mg  on Tuesdays

## 2010-10-10 NOTE — Assessment & Plan Note (Signed)
Summary: 3-4 WK F/U PER 10/10 OV-JM   Visit Type:  Follow-up Primary Provider:  Loleta Rose  CC:  atrial flutter.  History of Present Illness: The patient is seen back today for followup of supraventricular tachycardia.  I saw her last on June 17, 2010.  Her heart rate then was 70.  I did think she was probably having episodes of increased heart rate.  Her diltiazem dose was increased.  She is actually feeling relatively well.  However she does feel an increased heart rate for the last few days.  She's not having chest pain.  There's been no syncope or presyncope.  Preventive Screening-Counseling & Management  Alcohol-Tobacco     Smoking Status: never  Current Medications (verified): 1)  Nitroglycerin 0.4 Mg Subl (Nitroglycerin) .... Place 1 Tablet Under Tongue As Directed 2)  Sertraline Hcl 100 Mg Tabs (Sertraline Hcl) .... Take 1 Tablet By Mouth Once A Day 3)  Bilberry 40 Mg Caps (Bilberry (Vaccinium Myrtillus)) .... Take 1 Tablet By Mouth Once A Day 4)  Lutein 40 Mg Caps (Lutein) .... Take 1 Tablet By Mouth Once A Day 5)  Glipizide 5 Mg Tabs (Glipizide) .... Take 1 Tablet By Mouth Two Times A Day 6)  Vitamin C 1000 Mg Tabs (Ascorbic Acid) .... Take 1 Tablet By Mouth Once A Day 7)  Vitamin B-6 100 Mg Tabs (Pyridoxine Hcl) .... Take 1 Tablet By Mouth Once A Day 8)  Lantus 100 Unit/ml Soln (Insulin Glargine) .... Use As Directed 9)  Isosorbide Mononitrate Cr 30 Mg Xr24h-Tab (Isosorbide Mononitrate) .... Take 1 Tablet By Mouth Once A Day 10)  Levothyroxine Sodium 75 Mcg Tabs (Levothyroxine Sodium) .... Take 1 Tablet By Mouth Once A Day 11)  Diltiazem Hcl Er Beads 180 Mg Xr24h-Cap (Diltiazem Hcl Er Beads) .... Take One Capsule By Mouth At 4 Pm. 12)  Warfarin Sodium 5 Mg Tabs (Warfarin Sodium) .... Use As Directed 13)  Travatan Z 0.004 % Soln (Travoprost) .... Both Eyes Once Daily 14)  Exelon 4.6 Mg/24hr Pt24 (Rivastigmine) .... Take 1 Tablet By Mouth Once A Day 15)  Furosemide  20 Mg Tabs (Furosemide) .... Take 1 Tablet By Mouth Two Times A Day 16)  Potassium Chloride Cr 10 Meq Cr-Caps (Potassium Chloride) .... Take 1 Tablet By Mouth Once A Day  Allergies (verified): 1)  ! Oxycodone Hcl 2)  Morphine Sulfate (Morphine Sulfate)  Comments:  Nurse/Medical Assistant: The patient is currently on medications but does not know the name or dosage at this time. Instructed to contact our office with details. Will update medication list at that time.  Past History:  Past Medical History: HYPERTENSION, UNSPECIFIED (ICD-401.9) dyslipidemia CAD   cath...04/2009 40% left main,30% LAD/50-60% OM 2,40% right coronary artery , 90% OM1.was culprit lesion non-STEMI.[name] too small for intervention.. EF  55-60%  echo..01/15/2010 in hosp. ATRIAL FIBRILLATION... 04/2009.. rapid rate..Coumadin started TE cardioversion done 8/23, 2010. Return atrial fib.[name] amiodarone started.. consider outpatient cardioversion later  Beta blockers.[name] during hospitalization August, 2010.  Beta blockers lower blood pressure excessively without heart rate control / [place] atrial flutter with difficult rate control... TEE cardioversion done....ejection fraction 30%.. possibly secondary to tachycardia.. patient did convert back to sinus rhythm. / sometime after this the patient had return of atrial fibrillation /     outpatient..cardioversion May 31, 2009. [facility]... severe bradycardia.. chest compressions... patient quite stable... patient admitted and pacemaker placed  /  aimodarone used until stopped  01/2010  /  return of  atrial flutter October, 2011... amiodarone restarted AMIODARONE RX  2010.Marland Kitchenstopped with respiratory failure  01/2010 (not definitely related) Pacemaker.... St. Jude Accent.DR RF.[name] June 01, 2009 Diabetes Dementia ??? early ?? GERD H/O TIAs Hypothyroidism carotid endarterectomy.Marland KitchenRIGHT Stent to the right peroneal and right superficial as  well as right popliteal arteries..Dr. Edilia Bo Chronic kidney disease  ..creatinine  1.37  when D/C  hosp.  01/2010 Anemia of chronic kidney disease Gait disorder Vertebrobasilar insufficiency Depression Coumadin therapy.. started August, 2010 Hyperkalemia... ACE inhibitor held Acute renal insufficiency after cardiac catheterization.. August, 2010 Gastroparesis, abnormal GES 3/11 Right eye blindess Respiratory failure-hypoxic & hypercapnic  Jeani Hawking  01/2010..(primary etiology probably pneumonia) Leukocytosis  persistent post pneumonia..01/2010  Review of Systems       Patient denies fever, chills, headache, sweats, rash, change in vision, change in hearing, chest pain, cough, nausea vomiting, urinary symptoms.  All other systems are reviewed and are negative.  Vital Signs:  Patient profile:   76 year old female Height:      67 inches Weight:      170 pounds Pulse rate:   125 / minute BP sitting:   123 / 76  (left arm) Cuff size:   large  Vitals Entered By: Carlye Grippe (July 08, 2010 3:10 PM)  Physical Exam  General:  patient is stable today. Head:  head is atraumatic. Eyes:  no xanthelasma. Neck:  no jugular venous distention. Chest Wall:  no chest wall tenderness. Lungs:  lungs are clear cardiorespiratory effort is not labored. Heart:  cardiac exam reveals S1-S2.  No clicks or significant murmurs.  The resting heart rate is increased. Abdomen:  abdomen is soft. Msk:  no musculoskeletal deformities. Extremities:  no peripheral edema. Skin:  no skin rashes. Psych:  patient is oriented to person time and place.  Affect is normal.   PPM Specifications Following MD:  Hillis Range, MD     PPM Vendor:  St Jude     PPM Model Number:  [id number]     PPM Serial Number:  [id number] PPM DOI:  06/01/2009     PPM Implanting MD:  Hillis Range, MD  Lead 1    Location: [facility]     DOI: 06/01/2009     Model #: 1688TC     Serial #: [id number]     Status: active Lead 2    Location: [facility]     DOI:  06/01/2009     Model #: 1948     Serial #: [id number]     Status: active  Magnet Response Rate:  BOL 100 ERI 85    PPM Follow Up Pacer Dependent:  No      Episodes Coumadin:  Yes  Parameters Mode:  DDDR     Lower Rate Limit:  60     Upper Rate Limit:  120 Paced AV Delay:  180     Sensed AV Delay:  160  Impression & Recommendations:  Problem # 1:  * RESPIRATORY FAILURE  01/2010 Patient recovered from this illness well.  At that time there was never proved that it was related to amiodarone.  Problem # 2:  * AMIODARONE THERAPY We had stopped amiodarone in May, 2011.  She had a significant pulmonary illness that was probably pneumonia.  It now is necessary to restart the amiodarone.  Problem # 3:  BRADYCARDIA-TACHYCARDIA SYNDROME (ICD-427.81)  The following medications were removed from the medication list:    Isosorbide Mononitrate Cr 30 Mg Xr24h-tab (Isosorbide mononitrate) .[name]... Take 1 tablet  by mouth once a day Her updated medication list for this problem includes:    Nitroglycerin 0.4 Mg Subl (Nitroglycerin) .[name]... Place 1 tablet under tongue as directed    Diltiazem Hcl Er Beads 180 Mg Xr24h-cap (Diltiazem hcl er beads) .[name]... Take one capsule by mouth two times a day    Warfarin Sodium 5 Mg Tabs (Warfarin sodium) ..... Use as directed    Amiodarone Hcl 400 Mg Tabs (Amiodarone hcl) .[name]... Take one tablet by mouth twice a day The patient's bradycardia is prevented now by her pacemaker  Problem # 4:  COUMADIN THERAPY (ICD-V58.61) The patient is coumadinized.  INR will be checked today.  Problem # 5:  ATRIAL FIBRILLATION, HX OF (ICD-V12.59)  The following medications were removed from the medication list:    Isosorbide Mononitrate Cr 30 Mg Xr24h-tab (Isosorbide mononitrate) .[name]... Take 1 tablet by mouth once a day Her updated medication list for this problem includes:    Nitroglycerin 0.4 Mg Subl (Nitroglycerin) .[name]... Place 1 tablet under tongue as directed    Diltiazem Hcl Er  Beads 180 Mg Xr24h-cap (Diltiazem hcl er beads) .[name]... Take one capsule by mouth two times a day    Warfarin Sodium 5 Mg Tabs (Warfarin sodium) ..... Use as directed    Amiodarone Hcl 400 Mg Tabs (Amiodarone hcl) .[name]... Take one tablet by mouth twice a day  Orders: EKG w/ Interpretation (93000) T-Basic Metabolic Panel (13086-57846) T-CBC No Diff (96295-28413) T-TSH (24401-02725) T-Chest x-ray, 2 views (36644) EKG is done today and reviewed by me.  The rate is 120.  I suspect this is atrial flutter.  She is on diltiazem.  Amiodarone will be restarted today at 400 mg b.i.d.  I'll see her back next week.  We will also obtain a chest x-ray as a new baseline.  TSH hematology and chemistry will be checked.  The patient's Inderal be held.  Diltiazem will be increased to 180 mg p.o. b.i.d.  Patient Instructions: 1)  Return on Wednesday, November 9th at 2:30 for follow up appt with Dr. Myrtis Ser and Coumadin check. 2)  Weekly Coumadin checks for now. It is very important that you are complaint with these visits. 3)  Hold Imdur (isosorbide) for now. 4)  Start Amiodarone 400mg  by mouth two times a day. 5)  Increase Diltiazem to 180mg  by mouth two times a day. A new prescription was sent to your pharmacy to notify them of this change.  6)  Your physician recommends that you go to the [facility] for lab work and chest x-ray. Do today if possible.  Prescriptions: DILTIAZEM HCL ER BEADS 180 MG XR24H-CAP (DILTIAZEM HCL ER BEADS) Take one capsule by mouth two times a day  #60 x 2   Entered by:   Cyril Loosen, RN, BSN   Authorized by:   Talitha Givens, MD, [facility]   Signed by:   Cyril Loosen, RN, BSN on 07/08/2010   Method used:   Electronically to        [facility] (retail)       [address]       [place], [place]  [address]       Ph: [phone]       Fax: [phone]   RxID:   [id number] AMIODARONE HCL 400 MG TABS (AMIODARONE HCL) Take one tablet by mouth  twice a day  #60 x 1   Entered by:   Cyril Loosen, RN, BSN  Authorized by:   Talitha Givens, MD, [facility]   Signed by:   Cyril Loosen, RN, BSN on 07/08/2010   Method used:   Electronically to        [facility] (retail)       [address]       [place], [place]  [address]       Ph: [phone]       Fax: [phone]   RxID:   [id number]

## 2010-10-10 NOTE — Assessment & Plan Note (Signed)
Summary: 1 WK F/U PER 10/31 OV-CCR ALSO-JM   Visit Type:  Follow-up Referring Provider:  [name] Primary Provider:  Loleta Rose  CC:  atrial fibrillation.  History of Present Illness: The patient is seen for followup of her fibrillation/flutter.  At the time of the last visit I started amiodarone.  This was done after very very careful consideration.  She is tolerating the medication.  Baseline chest x-ray was checked and showed no marked abnormalities.  Hemoglobin was 12.3.  BUN was 27 with creatinine 1.18.  Potassium is 5.0.  Unfortunately the patient ran out of Coumadin.  Her INR today is less than 2.0.  I therefore cannot proceed with cardioversion.  Preventive Screening-Counseling & Management  Alcohol-Tobacco     Smoking Status: never  Current Medications (verified): 1)  Nitroglycerin 0.4 Mg Subl (Nitroglycerin) .... Place 1 Tablet Under Tongue As Directed 2)  Sertraline Hcl 100 Mg Tabs (Sertraline Hcl) .... Take 1 Tablet By Mouth Once A Day 3)  Bilberry 40 Mg Caps (Bilberry (Vaccinium Myrtillus)) .... Take 1 Tablet By Mouth Once A Day 4)  Lutein 40 Mg Caps (Lutein) .... Take 1 Tablet By Mouth Once A Day 5)  Glipizide 5 Mg Tabs (Glipizide) .... Take 1 Tablet By Mouth Two Times A Day 6)  Vitamin C 1000 Mg Tabs (Ascorbic Acid) .... Take 1 Tablet By Mouth Once A Day 7)  Vitamin B-6 100 Mg Tabs (Pyridoxine Hcl) .... Take 1 Tablet By Mouth Once A Day 8)  Lantus 100 Unit/ml Soln (Insulin Glargine) .... Use As Directed 9)  Levothyroxine Sodium 75 Mcg Tabs (Levothyroxine Sodium) .... Take 1 Tablet By Mouth Once A Day 10)  Diltiazem Hcl Er Beads 180 Mg Xr24h-Cap (Diltiazem Hcl Er Beads) .... Take One Capsule By Mouth Two Times A Day 11)  Warfarin Sodium 5 Mg Tabs (Warfarin Sodium) .... Use As Directed 12)  Travatan Z 0.004 % Soln (Travoprost) .... Both Eyes Once Daily 13)  Exelon 4.6 Mg/24hr Pt24 (Rivastigmine) .... Take 1 Tablet By Mouth Once A Day 14)  Furosemide 20 Mg Tabs  (Furosemide) .... Take 1 Tablet By Mouth Two Times A Day 15)  Potassium Chloride Cr 10 Meq Cr-Caps (Potassium Chloride) .... Take 1 Tablet By Mouth Once A Day 16)  Amiodarone Hcl 400 Mg Tabs (Amiodarone Hcl) .... Take One Tablet By Mouth Twice A Day  Allergies (verified): 1)  ! Oxycodone Hcl 2)  Morphine Sulfate (Morphine Sulfate)  Comments:  Nurse/Medical Assistant: The patient's medication list and allergies were reviewed with the patient and were updated in the Medication and Allergy Lists.  Past History:  Past Medical History: HYPERTENSION, UNSPECIFIED (ICD-401.9) dyslipidemia CAD   cath...04/2009 40% left main,30% LAD/50-60% OM 2,40% right coronary artery , 90% OM1.was culprit lesion non-STEMI.[name] too small for intervention.. EF  55-60%  echo..01/15/2010 in hosp. ATRIAL FIBRILLATION... 04/2009.. rapid rate..Coumadin started TE cardioversion done 8/23, 2010. Return atrial fib.[name] amiodarone started.. consider outpatient cardioversion later  Beta blockers.[name] during hospitalization August, 2010.  Beta blockers lower blood pressure excessively without heart rate control / [place] 2009 atrial flutter with difficult rate control... TEE cardioversion done....ejection fraction 30%.. possibly secondary to tachycardia.. patient did convert back to sinus rhythm. / sometime after this the patient had return of atrial fibrillation /     outpatient..cardioversion May 31, 2009. [facility]... severe bradycardia.. chest compressions... patient quite stable... patient admitted and pacemaker placed  /  aimodarone used until stopped  01/2010  /  return of atrial flutter  October, 2011... amiodarone restarted AMIODARONE RX  2010.Marland Kitchenstopped with respiratory failure  01/2010 (not definitely related) Pacemaker.... St. Jude Accent.DR RF.[name] June 01, 2009 Diabetes Dementia ??? early ?? GERD H/O TIAs Hypothyroidism carotid endarterectomy.Marland KitchenRIGHT Stent to the right  peroneal and right superficial as well as right popliteal arteries..Dr. Edilia Bo Chronic kidney disease  ..creatinine  1.37  when D/C  hosp.  01/2010 Anemia of chronic kidney disease Gait disorder Vertebrobasilar insufficiency.. Depression Coumadin therapy.. started August, 2010 Hyperkalemia... ACE inhibitor held Acute renal insufficiency after cardiac catheterization.. August, 2010 Gastroparesis, abnormal GES 3/11 Right eye blindess Respiratory failure-hypoxic & hypercapnic  Jeani Hawking  01/2010..(primary etiology probably pneumonia) Leukocytosis  persistent post pneumonia..01/2010  Review of Systems       Patient denies fever, chills, headache, sweats, rash, change in vision, change in hearing, chest pain, cough, nausea vomiting, urinary symptoms.  All other systems are reviewed and are negative.  Vital Signs:  Patient profile:   77 year old female Height:      67 inches Weight:      169 pounds O2 Sat:      97 % on Room air Pulse rate:   121 / minute BP sitting:   123 / 84  (left arm) Cuff size:   large  Vitals Entered By: Carlye Grippe (July 17, 2010 2:24 PM)  O2 Flow:  Room air  Physical Exam  General:  patient is stable. Head:  head is atraumatic. Eyes:  no xanthelasma. Neck:  no jugular venous attention. Chest Wall:  no chest wall tenderness. Lungs:  lungs are clear.  Respiratory effort is nonlabored. Heart:  cardiac exam reveals an S1-S2.  No clicks or significant murmurs. Abdomen:  abdomen is soft. Msk:  no musculoskeletal deformities. Extremities:  no peripheral edema or Skin:  no skin rashes. Psych:  patient is oriented to person time and place.  Affect is normal.   PPM Specifications Following MD:  Hillis Range, MD     PPM Vendor:  St Jude     PPM Model Number:  [id number]     PPM Serial Number:  [id number] PPM DOI:  06/01/2009     PPM Implanting MD:  Hillis Range, MD  Lead 1    Location: [facility]     DOI: 06/01/2009     Model #: [id number]     Serial #: [id number]      Status: active Lead 2    Location: [facility]     DOI: 06/01/2009     Model #: 1948     Serial #: [id number]     Status: active  Magnet Response Rate:  BOL 100 ERI 85    PPM Follow Up Pacer Dependent:  No      Episodes Coumadin:  Yes  Parameters Mode:  DDDR     Lower Rate Limit:  60     Upper Rate Limit:  120 Paced AV Delay:  180     Sensed AV Delay:  160  Impression & Recommendations:  Problem # 1:  * AMIODARONE THERAPY  Orders: EKG w/ Interpretation (93000) The patient has been on 400 mg p.o. b.i.d. for one week.  She will now be cut back to 400 mg daily.  I would like to proceed with cardioversion but cannot.  Problem # 2:  BRADYCARDIA-TACHYCARDIA SYNDROME (ICD-427.81)  Her updated medication list for this problem includes:    Nitroglycerin 0.4 Mg Subl (Nitroglycerin) .[name]... Place 1 tablet under tongue as directed    Diltiazem Hcl Er Beads  180 Mg Xr24h-cap (Diltiazem hcl er beads) .[name]... Take one capsule by mouth two times a day    Warfarin Sodium 5 Mg Tabs (Warfarin sodium) ..... Use as directed    Amiodarone Hcl 400 Mg Tabs (Amiodarone hcl) .[name]... Take one tablet by mouth once daily. The patient now has a pacemaker to protect her from bradycardia.  Problem # 3:  COUMADIN THERAPY (ICD-V58.61)  Orders: [facility] INR (16109) Unfortunately the patient ran out of Coumadin.  Her INRs dropped below 2.0.  Problem # 4:  ATRIAL FIBRILLATION, HX OF (ICD-V12.59)  Her updated medication list for this problem includes:    Nitroglycerin 0.4 Mg Subl (Nitroglycerin) .[name]... Place 1 tablet under tongue as directed    Diltiazem Hcl Er Beads 180 Mg Xr24h-cap (Diltiazem hcl er beads) .[name]... Take one capsule by mouth two times a day    Warfarin Sodium 5 Mg Tabs (Warfarin sodium) ..... Use as directed    Amiodarone Hcl 400 Mg Tabs (Amiodarone hcl) .[name]... Take one tablet by mouth once daily.  Orders: EKG w/ Interpretation (93000) [facility] INR ([address]) EKG is done today and reviewed by me.  Her rhythm  appears to be atrial flutter.  The rate is slower than the last visit.  She says that she has some shortness of breath when talking but there is no definite sign of heart failure.  The patient and her daughter and I very carefully considered 2 approaches.  One would be admission to the hospital with heparin and TEE cardioversion followed by Coumadin until she is therapeutic.  The second would be along her amiodarone load with plans for cardioversion when I feel it is safe from the Coumadin viewpoint.  I feel she is stable enough for this.  She and her daughter are in agreement.  This will be the plan for now.  Patient Instructions: 1)  Follow up with Misty Stanley in the [facility] on Tuesday, July 23, 2010 at 11:45am. 2)  Follow up with Dr. Myrtis Ser on Monday, August 05, 2010 at 2pm. 3)  Decrease Amiodarone to 400mg  by mouth once daily. 4)  Stop Potassium (Pt will be notified by phone to stop potassium) Prescriptions: WARFARIN SODIUM 5 MG TABS (WARFARIN SODIUM) use as directed  #30 x 1   Entered by:   Cyril Loosen, RN, BSN   Authorized by:   Talitha Givens, MD, [facility]   Signed by:   Cyril Loosen, RN, BSN on 07/17/2010   Method used:   Electronically to        [facility] [phone]* (retail)       [address]       [place], [place]  [address]       Ph: [phone] or [phone]       Fax: [phone]   RxID:   [id number]   Anticoagulant Therapy  Managed by: Eustace Pen RN  PCP: Loleta Rose Supervising MD: Andee Lineman MD, Michelle Piper Indication 1: Atrial Fibrillation Lab Used: [facility] [place] Site: [place] INR POC 1.4  Vital Signs: Weight: 169 lbs.  Pulse Rate: 121  Blood Pressure:  123 / 84   Dietary changes: no    Health status changes: no    Bleeding/hemorrhagic complications: no    Recent/future hospitalizations: no    Any changes in medication regimen? no    Recent/future dental: no  Any missed doses?: yes     Details: haven't taken  coumadin since Sunday  Is patient  compliant with meds? yes

## 2010-10-10 NOTE — Medication Information (Signed)
Summary: Coumadin Clinic  Anticoagulant Therapy  Managed by: Eustace Pen RN  PCP: Loleta Rose Supervising MD: Andee Lineman MD, Michelle Piper Indication 1: Atrial Fibrillation Lab Used: [facility]  Site: [place] INR POC 3.1  Dietary changes: no    Health status changes: no    Bleeding/hemorrhagic complications: no    Recent/future hospitalizations: yes       Details: pending DCCV  Any changes in medication regimen? yes       Details: Started amiodarone 400mg  BID   Recent/future dental: no  Any missed doses?: no       Is patient compliant with meds? yes       Allergies: 1)  ! Oxycodone Hcl 2)  Morphine Sulfate (Morphine Sulfate)  Anticoagulation Management History:      The patient is taking warfarin and comes in today for a routine follow up visit.  Warfarin therapy is being given due to atrial fibrillation requiring cardioversion.  Positive risk factors for bleeding include an age of 27 years or older, history of CVA/TIA, and presence of serious comorbidities.  Negative risk factors for bleeding include no history of GI bleeding.  The bleeding index is 'high risk'.  Positive CHADS2 values include History of HTN, Age > 24 years old, History of Diabetes, and Prior Stroke/CVA/TIA.  Negative CHADS2 values include History of CHF.  The start date was 04/29/2009.  Her last INR was 2.02.  Anticoagulation responsible provider: Andee Lineman MD, Michelle Piper.  INR POC: 3.1.  Cuvette Lot#: [id number].  Exp: 10/11.    Anticoagulation Management Assessment/Plan:      The patient's current anticoagulation dose is Warfarin sodium 5 mg tabs: use as directed.  The target INR is 2.0-3.0.  The next INR is due 07/17/2010.  Anticoagulation instructions were given to patient.  Results were reviewed/authorized by Eustace Pen RN .  She was notified by Eustace Pen RN .        Coagulation management information includes: 07/08/10  Pending DCCV    Needs weekly INR checks   Started amiodarone 400mg  two times a day  on 07/08/10.  Prior Anticoagulation Instructions: INR 2.1 Continue coumadin 2.5mg  once daily   Current Anticoagulation Instructions: INR 3.1 Hold coumadin tonight then resume 2.5mg  once daily

## 2010-10-10 NOTE — Medication Information (Signed)
Summary: coumadin management  Anticoagulant Therapy  Managed by: Cyril Loosen, RN PCP: [name], MD Supervising MD: Diona Browner MD, Remi Deter Indication 1: Atrial Fibrillation Lab Used: [facility] [place] Site: [place] INR POC 2.8  Dietary changes: no    Health status changes: no    Bleeding/hemorrhagic complications: no    Recent/future hospitalizations: yes       Details: has been in [facility] with respiratory failure and pneumonia  Any changes in medication regimen? no    Recent/future dental: no  Any missed doses?: yes     Details: per hospital d/c summary 2 doses of coumadin were held then pt to resume 2.5mg  qd   Is patient compliant with meds? yes       Allergies: 1)  ! Oxycodone Hcl 2)  Morphine Sulfate (Morphine Sulfate)  Anticoagulation Management History:      The patient is taking warfarin and comes in today for a routine follow up visit.  The patient is on warfarin for atrial fibrillation requiring cardioversion.  Positive risk factors for bleeding include an age of 18 years or older, history of CVA/TIA, and presence of serious comorbidities.  Negative risk factors for bleeding include no history of GI bleeding.  The bleeding index is 'high risk'.  Positive CHADS2 values include History of HTN, Age > 50 years old, History of Diabetes, and Prior Stroke/CVA/TIA.  Negative CHADS2 values include History of CHF.  The start date was 04/29/2009.  Anticoagulation responsible provider: Diona Browner MD, Remi Deter.  INR POC: 2.8.  Cuvette Lot#: [id number].  Exp: 10/11.    Anticoagulation Management Assessment/Plan:      The patient's current anticoagulation dose is Coumadin 2.5 mg tabs: use as directed per Anticoaguation Clinic.  The target INR is 2.0-3.0.  The next INR is due 02/18/2010.  Anticoagulation instructions were given to patient.  Results were reviewed/authorized by Cyril Loosen, RN.  She was notified by Cyril Loosen, RN.         Prior Anticoagulation  Instructions: Called with results of PT/INR obtained on pt today.  PT 19.1  INR 1.6  Coumadin was held while she was in the hospital per pt.  Discharge INR on 5/22 was 2.1.  Order given for pt to take coumadin 5mg  tonight then resume 2.5mg  once daily.  [facility] to recheck INR on 02/06/10.  Current Anticoagulation Instructions: INR 2.8 Continue coumadin 2.5mg  once daily.  [facility] will recheck on 02/18/10

## 2010-10-10 NOTE — Progress Notes (Signed)
Summary: coumadin management  Phone Note Other Incoming   Caller: Brittney Dach RN [facility] Reason for Call: Discuss lab or test results Summary of Call: Called with results of PT/INR obtained on pt today.  PT 19.1  INR 1.6  Coumadin was held while she was in the hospital per pt.  Discharge INR on 5/22 was 2.1.  Order given for pt to take coumadin 5mg  tonight then resume 2.5mg  once daily.  [facility] to recheck INR on 02/06/10. Initial call taken by: Vashti Hey RN,  Jan 31, 2010 3:34 PM     Anticoagulant Therapy  Managed by: Vashti Hey, RN PCP: Judene Companion MD: Diona Browner MD, Remi Deter Indication 1: Atrial Fibrillation Lab Used: [facility] [place] Site: [place]  Dietary changes: no    Health status changes: no    Bleeding/hemorrhagic complications: no    Recent/future hospitalizations: yes       Details: In [facility] 01/13/10 - 01/27/10 for resp. failure  Any changes in medication regimen? no    Recent/future dental: no  Any missed doses?: no       Is patient compliant with meds? yes      Comments: Pt was discharged home on coumadin 2.5mg  once daily.    Anticoagulation Management History:      Her anticoagulation is being managed by telephone today.  The patient is on warfarin for atrial fibrillation requiring cardioversion.  Positive risk factors for bleeding include an age of 40 years or older, history of CVA/TIA, and presence of serious comorbidities.  Negative risk factors for bleeding include no history of GI bleeding.  The bleeding index is 'high risk'.  Positive CHADS2 values include History of HTN, Age > 25 years old, History of Diabetes, and Prior Stroke/CVA/TIA.  Negative CHADS2 values include History of CHF.  The start date was 04/29/2009.  Anticoagulation responsible provider: Diona Browner MD, Remi Deter.  Cuvette Lot#: [id number].  Exp: 10/11.    Anticoagulation Management Assessment/Plan:      The patient's current anticoagulation dose is Warfarin sodium 5 mg tabs: Use as  directed by Anticoagulation Clinic.  The target INR is 2.0-3.0.  The next INR is due 02/06/2010.  Anticoagulation instructions were given to Brittney Dach RN [facility]  Results were reviewed/authorized by Vashti Hey, RN.  She was notified by Brittney Dach RN [facility].        Coagulation management information includes: 01/27/10 D/C INR 2.1  Sent home on coumadin 5mg  once daily .  Prior Anticoagulation Instructions: INR 1.3 Take coumadin 5mg  x 2 then resume 2.5mg  once daily except none on Saturdays Scheduled for colonoscopy on 01/14/10 by Dr Jena Gauss  Pt told to hold coumadin 3-4 days per Dr Jena Gauss.  Will recheck INR after colonoscopy.  Current Anticoagulation Instructions: Called with results of PT/INR obtained on pt today.  PT 19.1  INR 1.6  Coumadin was held while she was in the hospital per pt.  Discharge INR on 5/22 was 2.1.  Order given for pt to take coumadin 5mg  tonight then resume 2.5mg  once daily.  [facility] to recheck INR on 02/06/10.

## 2010-10-10 NOTE — Letter (Signed)
Summary: Discharge Summary  Discharge Summary   Imported By: Dorise Hiss 01/29/2010 15:16:13  _____________________________________________________________________  External Attachment:    Type:   Image     Comment:   External Document

## 2010-10-10 NOTE — Consult Note (Signed)
Summary: CARDIOLOGY CONSULT/ [facility]  CARDIOLOGY CONSULT/ [facility]   Imported By: Zachary George 11/14/2009 08:29:23  _____________________________________________________________________  External Attachment:    Type:   Image     Comment:   External Document

## 2010-10-10 NOTE — Medication Information (Signed)
Summary: Coumadin Clinic  Anticoagulant Therapy  Managed by: Vashti Hey, RN PCP: Loleta Rose Supervising MD: Myrtis Ser MD, Tinnie Gens Indication 1: Atrial Fibrillation Lab Used: [facility] [place] Site: [place] INR POC 2.0  Dietary changes: no    Health status changes: no    Bleeding/hemorrhagic complications: no    Recent/future hospitalizations: yes       Details: pending DCCV  Any changes in medication regimen? no    Recent/future dental: no  Any missed doses?: no       Is patient compliant with meds? yes       Allergies: 1)  ! Oxycodone Hcl 2)  Morphine Sulfate (Morphine Sulfate)  Anticoagulation Management History:      The patient is taking warfarin and comes in today for a routine follow up visit.  Warfarin therapy is being given due to atrial fibrillation requiring cardioversion.  Positive risk factors for bleeding include an age of 18 years or older, history of CVA/TIA, and presence of serious comorbidities.  Negative risk factors for bleeding include no history of GI bleeding.  The bleeding index is 'high risk'.  Positive CHADS2 values include History of HTN, Age > 37 years old, History of Diabetes, and Prior Stroke/CVA/TIA.  Negative CHADS2 values include History of CHF.  The start date was 04/29/2009.  Her last INR was 2.02.  Anticoagulation responsible provider: Myrtis Ser MD, Tinnie Gens.  INR POC: 2.0.  Cuvette Lot#: [id number].  Exp: 10/11.    Anticoagulation Management Assessment/Plan:      The patient's current anticoagulation dose is Warfarin sodium 5 mg tabs: take as directed per our coumadin clinic.  The target INR is 2.0-3.0.  The next INR is due 08/09/2010.  Anticoagulation instructions were given to patient.  Results were reviewed/authorized by Vashti Hey, RN.  She was notified by Vashti Hey RN.         Prior Anticoagulation Instructions: INR 2.1  (1st theraputic) Continue coumadin 2.5mg  once daily except 5mg  on Tuesdays  Current Anticoagulation  Instructions: INR 2.0 Take coumadin 5mg  x 2 then resume 2.5mg  once daily except 5mg  on Thursdays and recheck INR on 08/09/10.  May need to increase dose.  Dr Myrtis Ser wants to keep INR higher for pending DCCV.

## 2010-10-10 NOTE — Letter (Signed)
Summary: TCS/EGD POSS ED  ORDER  TCS/EGD POSS ED  ORDER   Imported By: Ave Filter 12/24/2009 15:36:15  _____________________________________________________________________  External Attachment:    Type:   Image     Comment:   External Document

## 2010-10-10 NOTE — Assessment & Plan Note (Signed)
Summary: RECURRENT VOMITING,ELEVATED LFTS.GLU   Visit Type:  Initial Consult Referring Provider:  Cresenzo Primary Care Provider:  C[facility]  Chief Complaint:  vomiting/elevated LFT'S.  History of Present Illness: Brittney [name] is a pleasant 77 y/o female, patient of Dr. [name], who presents for further evaluation of n/v and abnormal lfts. Over the past couple of months, patient was seen in ED twice for vomiting. She was treated for virus. Because of persisting symptoms she saw Dr. [name] and has subsequently been diagnosed with gastroparesis. See below for details. She was started on low-dose reglan and n/v have resolved. She had lost over 20 pounds but tells me she has now gained about 1/2 back. She also c/o chronic constipation. She takes Miralax or benefiber daily but has found that miralax works better. On that regimen, she normally has a daily BM. Denies melena, brbpr. No prior colonoscopy done because the patient does not want one. She c/o dysphagia to breads and meats. She notes her DM is poorly controlled with last HgbA1C over 9. She also hase renal insufficiency since her heart cath last fall and now is scheduled to see Dr. Fausto Skillern next month. She has been off lipitor for 3-4 weeks now, due to elevated lfts.  Xrays: Abd U/S, 11/15/09 --> minimal fatty liver, mildly dilated CBD, 7-49mm, slightly increased since 2007, no intrahepatic biliary dilation GES, 11/07/09 --> 100% activity remaining in stomach at two hours UGI, 11/15/09 --> esophageal dysmotility, mild gastroesophageal reflux  Labs:  10/18/09: AST 100, ALT 153, WBC 11.1, H/H 14.2/43, Plt 320,000, amylase 34, lipase 18, cortisol 11.8,                tsh 4.781 04/28/09: AST 25, ALT 27 10/11/08: AST 133, ALT 81  Current Medications (verified): 1)  Nitroglycerin 0.4 Mg Subl (Nitroglycerin) .... Place 1 Tablet Under Tongue As Directed 2)  Sertraline Hcl 100 Mg Tabs (Sertraline Hcl) .... Take 1 Tablet By Mouth Once A Day 3)  Bilberry 40  Mg Caps (Bilberry (Vaccinium Myrtillus)) .... Take 1 Tablet By Mouth Once A Day 4)  Lutein 40 Mg Caps (Lutein) .... Take 1 Tablet By Mouth Once A Day 5)  Glucotrol 5 Mg Tabs (Glipizide) .... Take 1 Tablet By Mouth Two Times A Day 6)  Vitamin C 1000 Mg Tabs (Ascorbic Acid) .... Take 1 Tablet By Mouth Once A Day 7)  Vitamin B-6 100 Mg Tabs (Pyridoxine Hcl) .... Take 1 Tablet By Mouth Once A Day 8)  Lantus 100 Unit/ml Soln (Insulin Glargine) .... 30 Units Sq Every Night. 9)  Amiodarone Hcl 200 Mg Tabs (Amiodarone Hcl) .... Take One Tablet By Mouth Daily Alternating With One-Half. 10)  Diltiazem Hcl Er Beads 360 Mg Xr24h-Cap (Diltiazem Hcl Er Beads) .... Take One Capsule By Mouth Every Morning 11)  Isosorbide Mononitrate Cr 60 Mg Xr24h-Tab (Isosorbide Mononitrate) .... Take One Tablet By Mouth Daily 12)  Levothroid 50 Mcg Tabs (Levothyroxine Sodium) .... Take 1 Tablet By Mouth Once A Day 13)  Diltiazem Hcl Er Beads 180 Mg Xr24h-Cap (Diltiazem Hcl Er Beads) .... Take One Capsule By Mouth At 4 Pm. 14)  Warfarin Sodium 5 Mg Tabs (Warfarin Sodium) .... Use As Directed By Anticoagulation Clinic 15)  Travatan Z 0.004 % Soln (Travoprost) .... Both Eyes Once Daily 16)  Asa 81 Mg .... Take 1 Tablet By Mouth Once A Day 17)  Reglan 5 Mg Tabs (Metoclopramide Hcl) .... Take 1 Tablet By Mouth Three Times A Day 18)  Exelon 4.6 Mg/24hr Pt24 (  Rivastigmine) .... Take 1 Tablet By Mouth Once A Day  Allergies (verified): 1)  ! Oxycodone Hcl 2)  Morphine Sulfate (Morphine Sulfate)  Past History:  Past Medical History: HYPERTENSION, UNSPECIFIED (ICD-401.9) dyslipidemia CAD catheterization August, 2010 40% left main/30% LAD/50-60% OM 2/40% right coronary artery/90% OM1.. this was felt to be the culprit lesion for a non-STEMI August, 2010 this vessel was too small for intervention.. Atrial fibrillation... August, 2010.. with persistent rapid rate and Coumadin started TE cardioversion done 8/23, 2010. Return atrial  fib.[name] amiodarone started.. consider outpatient cardioversion later  Beta blockers.[name] during hospitalization August, 2010.  Beta blockers lower blood pressure excessively without heart rate control / [place] atrial flutter with difficult rate control... TEE cardioversion done....ejection fraction 30%.. possibly secondary to tachycardia.. patient did convert back to sinus rhythm. / sometime after this the patient had return of atrial fibrillation.  She was in a cardiac rehabilitation and felt poorly with increased heart rate and was admitted to [facility]. Her rate was controlled.  There was no MI.  She was sent home the plan for outpatient cardioversion.  /  outpatient..cardioversion May 31, 2009. [facility]... severe bradycardia.. chest compressions... patient quite stable... patient admitted and pacemaker placed Pacemaker.... [employer].DR RF.[name] June 01, 2009 Amiodarone therapy Diabetes Dementia ??? early ?? GERD H/O TIAs Hypothyroidism Peripheral vascular disease and cerebrovascular disease.. status post right carotid endarterectomy.. status post PCI stent to the right peroneal and right superficial as well as right popliteal arteries..Dr. Edilia Bo Chronic kidney disease Anemia of chronic kidney disease Gait disorder Vertebrobasilar insufficiency Depression Coumadin therapy.. started August, 2010 Hyperkalemia... ACE inhibitor held Acute renal insufficiency after cardiac catheterization.. August, 2010 Gastroparesis, abnormal GES 3/11 Right eye blindess  Past Surgical History: Abdominal Hysterectomy-Total Cholecystectomy Carotid Endarterectomy Right midfoot amputation, 2010 Tonsillectomy Several right eye surgeries status post PCI stent to the right peroneal and right superficial as well as right popliteal arteries..Dr. Edilia Bo  Family History: Family History of Aortic Aneurysm: Mother Family History of Cancer:  No FH of CRC,  colon polyps, liver disease. Father, brain tumor  Social History: Widowed, three Tobacco Use - No.  Alcohol Use - no Regular Exercise - yes Drug Use - no  Review of Systems General:  Complains of weight loss; denies fever, chills, sweats, anorexia, fatigue, and weakness. Eyes:  Complains of vision loss; chronic vision loss right eye. ENT:  Complains of difficulty swallowing; denies nasal congestion, sore throat, and hoarseness. CV:  Denies chest pains, angina, palpitations, dyspnea on exertion, and peripheral edema. Resp:  Denies dyspnea at rest, dyspnea with exercise, and cough. GI:  See HPI. GU:  Denies urinary burning and blood in urine. MS:  Denies joint pain / LOM. Derm:  Denies rash and itching. Neuro:  Denies weakness, frequent headaches, and confusion. Psych:  Denies depression and anxiety. Endo:  Complains of unusual weight change. Heme:  Denies bruising and bleeding. Allergy:  Denies hives and rash.  Vital Signs:  Patient profile:   77 year old female Height:      67 inches Weight:      165 pounds BMI:     25.94 Temp:     98.0 degrees F oral Pulse rate:   60 / minute BP sitting:   150 / 62  (left arm) Cuff size:   large  Vitals Entered By: Cloria Spring LPN (November 29, 2009 1:28 PM)  Physical Exam  General:  Well developed, well nourished, no acute distress. Head:  Normocephalic and atraumatic. Eyes:  Conjunctivae pink, no scleral icterus.  Mouth:  Oropharyngeal mucosa moist, pink.  No lesions, erythema or exudate.    Neck:  Supple; no masses or thyromegaly. Lungs:  Clear throughout to auscultation. Heart:  Regular rate and rhythm; no murmurs, rubs,  or bruits. Abdomen:  Bowel sounds normal.  Abdomen is soft, nontender, nondistended.  No rebound or guarding.  No hepatosplenomegaly, masses or hernias.  No abdominal bruits.  Extremities:  No clubbing, cyanosis, edema or deformities noted. Neurologic:  Alert and  oriented x4;  grossly normal  neurologically. Skin:  Intact without significant lesions or rashes. Cervical Nodes:  No significant cervical adenopathy. Psych:  Alert and cooperative. Normal mood and affect.  Impression & Recommendations:  Problem # 1:  GASTROPARESIS (ICD-536.3)  New diagnosis of gastroparesis, likely secondary to diabetes. Doing better now on low-dose reglan. No evidence of gastric outlet obstruction on UGI series. Weight improving with resolution of n/v.  Orders: Consultation Level IV (16109)  Problem # 2:  DYSPHAGIA UNSPECIFIED (ICD-787.20)  Dysphagia to solid foods, breads and meats. Notable esophageal dysmotility of recent UGIseries. Unclear if esophageal dilation needed at this time, given no barium tablet administered (not a dedicated esophagram). Will discuss with Dr. Jena Gauss. Will need to address coumadin if dilation planned.   Orders: Consultation Level IV ([id number])  Problem # 3:  TRANSAMINASES, SERUM, ELEVATED (ICD-790.4) Intermittent transaminitis. Noted one year ago as well. Now off Lipitor. Doubt mild CBD dilation is significant. She cannot have MRCP due to her pacemaker. Will recheck LFTs, now off Lipitor.  Orders: T-Hepatic Function [phone]) Consultation Level IV ([id number])  Problem # 4:  CONSTIPATION, CHRONIC (ICD-564.09)  Resume miralax or equivalent such as Dulcolax Balance, coupon given. Patient does not want to pursue TCS at this time.   Orders: Consultation Level IV ([id number])   I would like to thank Dr. [name] for allowing [place] to take part in the care of this nice patient.  Appended Document: RECURRENT VOMITING,ELEVATED LFTS.GLU Discussed with Dr. Jena Gauss. Would offer patient BPE for further evaluation of dysphagia. If abnormal ie shows stricture, then we would do EGD/ED.  Please arrange for BPE if patient agreeable.  Appended Document: RECURRENT VOMITING,ELEVATED LFTS.GLU spoke with pt and her daughterLupita [name]- they want to wait on the BPE and pt has changed her mind on  the TCS and wants to have it done now. please advise.   Appended Document: RECURRENT VOMITING,ELEVATED LFTS.GLU Please find out from both her cardiologist (Dr. Willa Rough) and vascular md (Dr. Waverly Ferrari) the following:  Dr. Myrtis Ser --> Can patient undergo EGD/TCS given her cardiac events last fall?  If so, can she hold Coumadin four days prior to procedure OR does she need Lovenox bridge?  Dr. Edilia Bo --> Can patient come off Coumadin for EGD/TCS given her h/o PVD OR does he recommend Lovenox bridge?  Appended Document: RECURRENT VOMITING,ELEVATED LFTS.GLU From cardiac viewpoint, patient can undergo EGD. Coumadin can be held for this with no bridging.  Appended Document: RECURRENT VOMITING,ELEVATED LFTS.GLU Received written confirmation from Dr. Edilia Bo.   Please schedule colonoscopy AND EGD with possible esoph dilatation (if patient wants due to dysphagia) with Dr. Jena Gauss Day of prep, Lantus 15 units at bedtime. Glucotrol 2.5mg  two times a day. Hold Coumadin for four days Continue ASA.   Appended Document: RECURRENT VOMITING,ELEVATED LFTS.GLU Pt scheduled for 01/14/10@9 :15a.m.

## 2010-10-10 NOTE — Assessment & Plan Note (Signed)
Summary: 3 MO FU PER SEPT REMINDER   Visit Type:  Follow-up Primary Kobi Aller:  Loleta Rose  CC:  CAD.  History of Present Illness: The patient is seen for followup of coronary artery disease and paroxysmal atrial fibrillation.  I saw her last June, 2011.  She had been hospitalized with a significant respiratory illness.  She stabilized.  In the past month she has been off diltiazem as there have been some difficulties having her medicines filled.  The patient was started on a medication by Dr. Sherwood Gambler.  I think she does not have it with her today.  Her daughter will call back to [place]. This medicine was started on September 12.  Since that time she has had some chest discomfort and arm discomfort.  She has also had some palpitations.    Preventive Screening-Counseling & Management  Alcohol-Tobacco     Smoking Status: never  Current Medications (verified): 1)  Nitroglycerin 0.4 Mg Subl (Nitroglycerin) .... Place 1 Tablet Under Tongue As Directed 2)  Sertraline Hcl 100 Mg Tabs (Sertraline Hcl) .... Take 1 Tablet By Mouth Once A Day 3)  Bilberry 40 Mg Caps (Bilberry (Vaccinium Myrtillus)) .... Take 1 Tablet By Mouth Once A Day 4)  Lutein 40 Mg Caps (Lutein) .... Take 1 Tablet By Mouth Once A Day 5)  Glipizide 5 Mg Tabs (Glipizide) .... Take 1 Tablet By Mouth Two Times A Day 6)  Vitamin C 1000 Mg Tabs (Ascorbic Acid) .... Take 1 Tablet By Mouth Once A Day 7)  Vitamin B-6 100 Mg Tabs (Pyridoxine Hcl) .... Take 1 Tablet By Mouth Once A Day 8)  Lantus 100 Unit/ml Soln (Insulin Glargine) .... Use As Directed 9)  Isosorbide Mononitrate Cr 30 Mg Xr24h-Tab (Isosorbide Mononitrate) .... Take 1 Tablet By Mouth Once A Day 10)  Levothyroxine Sodium 75 Mcg Tabs (Levothyroxine Sodium) .... Take 1 Tablet By Mouth Once A Day 11)  Diltiazem Hcl Er Beads 180 Mg Xr24h-Cap (Diltiazem Hcl Er Beads) .... Take One Capsule By Mouth At 4 Pm. 12)  Warfarin Sodium 5 Mg Tabs (Warfarin Sodium) .... Use As  Directed 13)  Travatan Z 0.004 % Soln (Travoprost) .... Both Eyes Once Daily 14)  Exelon 4.6 Mg/24hr Pt24 (Rivastigmine) .... Take 1 Tablet By Mouth Once A Day 15)  Furosemide 20 Mg Tabs (Furosemide) .... Take 1 Tablet By Mouth Two Times A Day 16)  Potassium Chloride Cr 10 Meq Cr-Caps (Potassium Chloride) .... Take 1 Tablet By Mouth Once A Day  Allergies (verified): 1)  ! Oxycodone Hcl 2)  Morphine Sulfate (Morphine Sulfate)  Comments:  Nurse/Medical Assistant: The patient's medication bottles and allergies were reviewed with the patient and were updated in the Medication and Allergy Lists.  Past History:  Past Medical History: HYPERTENSION, UNSPECIFIED (ICD-401.9) dyslipidemia CAD   cath...04/2009 40% left main,30% LAD/50-60% OM 2,40% right coronary artery , 90% OM1.was culprit lesion non-STEMI.[name] too small for intervention.. EF  55-60%  echo..01/15/2010 in hosp. ATRIAL FIBRILLATION... August, 2010.. with persistent rapid rate and Coumadin started TE cardioversion done 8/23, 2010. Return atrial fib.[name] amiodarone started.. consider outpatient cardioversion later  Beta blockers.[name] during hospitalization August, 2010.  Beta blockers lower blood pressure excessively without heart rate control / [place] atrial flutter with difficult rate control... TEE cardioversion done....ejection fraction 30%.. possibly secondary to tachycardia.. patient did convert back to sinus rhythm. / sometime after this the patient had return of atrial fibrillation /     outpatient..cardioversion May 31, 2009. [facility]..[name]  severe bradycardia.. chest compressions... patient quite stable... patient admitted and pacemaker placed  /  aimodarone used until stopped  01/2010 AMIODARONE RX  2010.Marland Kitchenstopped with respiratory failure  01/2010 (not definitely related) Pacemaker.... St. Jude Accent.DR RF.[name] June 01, 2009 Diabetes Dementia ??? early ?? GERD H/O  TIAs Hypothyroidism carotid endarterectomy.Marland KitchenRIGHT Stent to the right peroneal and right superficial as well as right popliteal arteries..Dr. Edilia Bo Chronic kidney disease  ..creatinine  1.37  when D/C  hosp.  01/2010 Anemia of chronic kidney disease Gait disorder Vertebrobasilar insufficiency Depression Coumadin therapy.. started August, 2010 Hyperkalemia... ACE inhibitor held Acute renal insufficiency after cardiac catheterization.. August, 2010 Gastroparesis, abnormal GES 3/11 Right eye blindess Respiratory failure-hypoxic & hypercapnic  [facility] Hawking  01/2000..(primary etiology probably pneumonia) Leukocytosis  persistent post pneumonia..01/2010  Review of Systems       Patient denies fever, chills, headache, sweats, rash, change in vision, change in hearing, cough, nausea vomiting, urinary symptoms.  All other systems are reviewed and are negative.  Vital Signs:  Patient profile:   77 year old female Height:      67 inches Weight:      169 pounds Pulse rate:   65 / minute BP sitting:   127 / 68  (left arm) Cuff size:   large  Vitals Entered By: Carlye Grippe (June 17, 2010 9:56 AM)  Physical Exam  General:  patient is stable today. Head:  head is atraumatic. Eyes:  no xanthelasma Neck:  no jugular venous extension. Chest Wall:  no chest wall tenderness. Lungs:  lungs are clear.  Respiratory effort is nonlabored. Heart:  cardiac exam reveals S1-S2.  There are no clicks or significant murmurs.  The rhythm is regular. Abdomen:  abdomen is soft. Msk:  no musculoskeletal 40s. Extremities:  no peripheral edema. Skin:  no skin rashes. Psych:  patient is oriented to person time and place.  Affect is normal.   PPM Specifications Following MD:  Hillis Range, MD     PPM Vendor:  St Jude     PPM Model Number:  [id number]     PPM Serial Number:  [id number] PPM DOI:  06/01/2009     PPM Implanting MD:  Hillis Range, MD  Lead 1    Location: RA     DOI: 06/01/2009     Model #: [id number]      Serial #: [id number]     Status: active Lead 2    Location: RV     DOI: 06/01/2009     Model #: 1948     Serial #: [id number]     Status: active  Magnet Response Rate:  BOL 100 ERI 85    PPM Follow Up Pacer Dependent:  No      Episodes Coumadin:  Yes  Parameters Mode:  DDDR     Lower Rate Limit:  60     Upper Rate Limit:  120 Paced AV Delay:  180     Sensed AV Delay:  160  Impression & Recommendations:  Problem # 1:  * RESPIRATORY FAILURE  01/2010 The patient's overall respiratory status has improved.  No further workup.  Problem # 2:  * AMIODARONE THERAPY Amiodarone was stopped while she was hospitalized.  I was in agreement with this and up to this point I have chosen not to restart it.  However, we may need to consider this or other medications.  Problem # 3:  BRADYCARDIA-TACHYCARDIA SYNDROME (ICD-427.81)  Her updated medication list for this problem includes:  Nitroglycerin 0.4 Mg Subl (Nitroglycerin) .[name]... Place 1 tablet under tongue as directed    Isosorbide Mononitrate Cr 30 Mg Xr24h-tab (Isosorbide mononitrate) .[name]... Take 1 tablet by mouth once a day    Diltiazem Hcl Er Beads 180 Mg Xr24h-cap (Diltiazem hcl er beads) .[name]... Take one capsule by mouth at 4 pm.    Warfarin Sodium 5 Mg Tabs (Warfarin sodium) ..... Use as directed The patient's pacemaker is in place.  It is working well.  We may need to interrogate it again in the near future.  Problem # 4:  CAROTID BRUIT (ICD-785.9) The patient had a carotid endarterectomy in the past .  We will have to review when her last Doppler was done.  Problem # 5:  RENAL INSUFFICIENCY (ICD-588.9) The patient's renal function was rechecked after her last visit.  BUN was 25 with creatinine 1.21.  This was stable.  Problem # 6:  COUMADIN THERAPY (ICD-V58.61) The patient continues on Coumadin.  Problem # 7:  HYPOTHYROIDISM (ICD-244.9)  Her updated medication list for this problem includes:    Levothyroxine Sodium 75 Mcg Tabs  (Levothyroxine sodium) .[name]... Take 1 tablet by mouth once a day The patient's thyroid meds are being adjusted by Dr. Sherwood Gambler.  Problem # 8:  CAD, NATIVE VESSEL (ICD-414.01)  Her updated medication list for this problem includes:    Nitroglycerin 0.4 Mg Subl (Nitroglycerin) .[name]... Place 1 tablet under tongue as directed    Isosorbide Mononitrate Cr 30 Mg Xr24h-tab (Isosorbide mononitrate) .[name]... Take 1 tablet by mouth once a day    Diltiazem Hcl Er Beads 180 Mg Xr24h-cap (Diltiazem hcl er beads) .[name]... Take one capsule by mouth at 4 pm.    Warfarin Sodium 5 Mg Tabs (Warfarin sodium) ..... Use as directed The patient has known coronary disease.  She has had some chest discomfort and arm discomfort recently.  I believe she may be feeling this when she has rapid heart rates.  I will start by trying to address the heart rate first.  We have catheterization data from August 2 010.  She has an OM lesion that is 90% stenosed and too small for intervention.  It is possible she may get symptoms from this when she is tachycardic.  We will check what medicine she received that was new on September 12 and then restart her diltiazem 180 or less there is a reason not to.  Patient Instructions: 1)  Follow up with Dr. Myrtis Ser on Monday, July 08, 2010 at 3:15pm. 2)  Resume Diltiazem 180mg  by mouth once daily. Prescriptions: DILTIAZEM HCL ER BEADS 180 MG XR24H-CAP (DILTIAZEM HCL ER BEADS) Take one capsule by mouth at 4 pm.  #30 x 6   Entered by:   Cyril Loosen, RN, BSN   Authorized by:   Talitha Givens, MD, [facility]   Signed by:   Cyril Loosen, RN, BSN on 06/17/2010   Method used:   Electronically to        [facility] # [phone]* (retail)       [address]       [place], [place]  [address]       Ph: [phone] or [phone]       Fax: [phone]   RxID:   [id number]

## 2010-10-10 NOTE — Medication Information (Signed)
Summary: ccr-lr  Anticoagulant Therapy  Managed by: Vashti Hey, RN PCP: Judene Companion MD: Andee Lineman MD, Michelle Piper Indication 1: Atrial Fibrillation Lab Used: [facility] [place] Site: [place]  Dietary changes: no    Health status changes: no    Bleeding/hemorrhagic complications: no    Recent/future hospitalizations: no    Any changes in medication regimen? no    Recent/future dental: no  Any missed doses?: no       Is patient compliant with meds? yes       Allergies: 1)  ! Oxycodone Hcl 2)  Morphine Sulfate (Morphine Sulfate)  Anticoagulation Management History:      The patient is taking warfarin and comes in today for a routine follow up visit.  The patient is taking warfarin for atrial fibrillation requiring cardioversion.  Positive risk factors for bleeding include an age of 75 years or older, history of CVA/TIA, and presence of serious comorbidities.  Negative risk factors for bleeding include no history of GI bleeding.  The bleeding index is 'high risk'.  Positive CHADS2 values include History of HTN, Age > 50 years old, History of Diabetes, and Prior Stroke/CVA/TIA.  Negative CHADS2 values include History of CHF.  The start date was 04/29/2009.  Anticoagulation responsible provider: Andee Lineman MD, Michelle Piper.  Cuvette Lot#: [id number].  Exp: 10/11.    Anticoagulation Management Assessment/Plan:      The patient's current anticoagulation dose is Warfarin sodium 5 mg tabs: Use as directed by Anticoagulation Clinic.  The target INR is 2.0-3.0.  The next INR is due 01/23/2010.  Anticoagulation instructions were given to daughter.  Results were reviewed/authorized by Vashti Hey, RN.  She was notified by Vashti Hey RN.         Prior Anticoagulation Instructions: 2.1 Continue coumadin 2.5mg  qd except none on Saturdays  Current Anticoagulation Instructions: INR 1.3 Take coumadin 5mg  x 2 then resume 2.5mg  once daily except none on Saturdays Scheduled for colonoscopy on 01/14/10 by  Dr Jena Gauss  Pt told to hold coumadin 3-4 days per Dr Jena Gauss.  Will recheck INR after colonoscopy.

## 2010-10-10 NOTE — Medication Information (Signed)
Summary: ccr-lr  Anticoagulant Therapy  Managed by: Brittney Hey, Brittney PCP: Brittney Duel, MD Supervising MD: Andee Lineman MD, Michelle Piper Indication 1: Atrial Fibrillation Lab Used: [facility] [place] Site: [place] INR POC 3.8  Dietary changes: no    Health status changes: no    Bleeding/hemorrhagic complications: no    Recent/future hospitalizations: no    Any changes in medication regimen? no    Recent/future dental: no  Any missed doses?: no       Is patient compliant with meds? yes       Allergies: 1)  ! Oxycodone Hcl 2)  Morphine Sulfate (Morphine Sulfate)  Anticoagulation Management History:      The patient is taking warfarin and comes in today for a routine follow up visit.  Warfarin therapy is being given due to atrial fibrillation requiring cardioversion.  Positive risk factors for bleeding include an age of 75 years or older, history of CVA/TIA, and presence of serious comorbidities.  Negative risk factors for bleeding include no history of GI bleeding.  The bleeding index is 'high risk'.  Positive CHADS2 values include History of HTN, Age > 58 years old, History of Diabetes, and Prior Stroke/CVA/TIA.  Negative CHADS2 values include History of CHF.  The start date was 04/29/2009.  Anticoagulation responsible provider: Andee Lineman MD, Michelle Piper.  INR POC: 3.8.  Cuvette Lot#: [id number].  Exp: 10/11.    Anticoagulation Management Assessment/Plan:      The patient's current anticoagulation dose is Warfarin sodium 5 mg tabs: Use as directed by Anticoagulation Clinic.  The target INR is 2.0-3.0.  The next INR is due 10/23/2009.  Anticoagulation instructions were given to daughter.  Results were reviewed/authorized by Brittney Hey, Brittney.  She was notified by Brittney Hey Brittney.         Prior Anticoagulation Instructions: INR 2.4 Continue coumadin 2.5mg  once daily except none on Saturdays   Current Anticoagulation Instructions: INR 3.8 Hold coumadin tonight then resume 2.5mg  once daily except none  on  Saturdays

## 2010-10-10 NOTE — Cardiovascular Report (Signed)
Summary: [facility]  [facility]   Imported By: Dorise Hiss 08/21/2010 09:14:54  _____________________________________________________________________  External Attachment:    Type:   Image     Comment:   External Document

## 2010-10-10 NOTE — Assessment & Plan Note (Signed)
Summary: eph-per matt request schd due to   Visit Type:  post hospital visit Primary Provider:  Loleta Rose  CC:  atrial fibrillation.  History of Present Illness: The patient is seen post hospitalization.  She has a multitude of cardiac problems.  She actually looks very good.  Most recently she was hospitalized after falling with syncope.  This was probably orthostatic.  It was ultimately felt that amiodarone might be playing a role and this was stopped.  In fact she was started on Midrin.  She uses this 2 times daily.  Her daughter is available to check her pressures.  We will adjust the timing of the doses based on her daily activities.  Fortunately she is holding sinus rhythm.  However she still has amiodarone in her system.  She had injuries to her face.  These have improved.  Interrogation of her pacemaker revealed 20 beats of ventricular tachycardia.  It was felt that this was not related to her problem.  It is also noted that when she has had atrial fibrillation she develops left ventricular dysfunction.  When we get her back to sinus rhythm her LV improves.  As part of the evaluation today I have reviewed the extensive data from the hospital including the history and physical and discharge summary and the multiple procedures that she has had.  Have also very carefully updated her past medical history on this record.  Preventive Screening-Counseling & Management  Alcohol-Tobacco     Smoking Status: never  Current Medications (verified): 1)  Nitroglycerin 0.4 Mg Subl (Nitroglycerin) .... Place 1 Tablet Under Tongue As Directed 2)  Sertraline Hcl 100 Mg Tabs (Sertraline Hcl) .... Take 1 Tablet By Mouth Once A Day 3)  Glipizide Xl 5 Mg Xr24h-Tab (Glipizide) .... Take 1 Tablet By Mouth Two Times A Day 4)  Vitamin C 1000 Mg Tabs (Ascorbic Acid) .... Take 1 Tablet By Mouth Once A Day 5)  Vitamin B-6 100 Mg Tabs (Pyridoxine Hcl) .... Take 1 Tablet By Mouth Once A Day 6)  Lantus 100  Unit/ml Soln (Insulin Glargine) .Marland KitchenMarland KitchenMarland Kitchen 15u Subcutaneously Two Times A Day 7)  Levothyroxine Sodium 75 Mcg Tabs (Levothyroxine Sodium) .... Take 1 Tablet By Mouth Once A Day 8)  Travatan Z 0.004 % Soln (Travoprost) .... Both Eyes Once Daily 9)  Exelon 4.6 Mg/24hr Pt24 (Rivastigmine) .... Apply 1 Patch Once A Day 10)  Aspir-Low 81 Mg Tbec (Aspirin) .... Take 2 Tablet By Mouth Once A Day 11)  Midodrine Hcl 5 Mg Tabs (Midodrine Hcl) .... Take 1 Tablet By Mouth Two Times A Day 12)  Tylenol Extra Strength 500 Mg Tabs (Acetaminophen) .... Take 1-2 Tablet By Mouth Four Times A Day As Needed  Allergies (verified): 1)  ! Oxycodone Hcl 2)  Morphine Sulfate (Morphine Sulfate)  Comments:  Nurse/Medical Assistant: The patient's medication list(d/c summay) and allergies were reviewed with the patient and were updated in the Medication and Allergy Lists.  Past History:  Past Medical History: HYPERTENSION,. dyslipidemia CAD   cath...04/2009 40% left main,30% LAD/50-60% OM 2,40% right coronary artery , 90% OM1.was culprit lesion non-STEMI.[name] too small for intervention.. EF  55-60%  echo..01/15/2010 / EF 30%.. TEE. tachycardia cardiomyopathy /   EF 50% echo..09/21/2010 ATRIAL FIBRILLATION... 04/2009.. rapid rate..Coumadin started TE cardioversion done 8/23, 2010... atrial fib.[name] amiodarone..Beta blockers.[name] during hospitalization August, 2010.  Beta blockers lower blood pressure excessively without heart rate control / [place] atrial flutter with difficult rate control... TEE cardioversion done....ejection fraction 30%.. secondary  to tachycardia.. patient did convert back to sinus rhythm. / return of atrial fibrillation /     outpatient..cardioversion May 31, 2009. [facility]... severe bradycardia.. chest compressions..pacemaker placed  /  aimodarone used until stopped  01/2010  /  return of atrial flutter October, 2011... amiodarone restarted 06/2010 / cardioversion  08/15/2010 /  amio stopped 09/2010 orthostasis/syncopehe Pacemaker.... St. Jude Accent.DR RF.[name] June 01, 2009 Diabetes Dementia ??? early ?? GERD H/O TIAs Hypothyroidism carotid endarterectomy.Marland KitchenRIGHT PAD  and Stent t right peroneal,  right superficial , right popliteal arteries..Dr. Edilia Bo right mid foot amputation  2010 CKD ..creatinine  1.37   01/2010, 1.38 hospital d/c 09/22/2010 Anemia CKD Gait disorder Vertebrobasilar insufficiency.. Depression Coumadin therapy.. started August, 2010 / stopped 09/2010 orthostasis/syncope / consider restart  Hyperkalemia... ACE inhibitor held Acute renal insufficiency.. catheterization.. 8, 2010 Gastroparesis, abnormal GES 3/11 blindess..R eye Respiratory failure-hypoxic & hypercapnic    01/2010..( etiology  pneumonia) Leukocytosis  persistent post pneumonia..01/2010 Orthostasis.  falling / syncope  09/2010 ... facial trauma ventricular tachycardia 20 beats 08/2011 asymptomatic  Review of Systems       Patient denies fever, chills, headache, sweats, rash, change in vision, change in hearing, chest pain, cough, nausea vomiting, urinary symptoms.  All other systems are reviewed and are negative.  Vital Signs:  Patient profile:   77 year old female Height:      67 inches Weight:      164 pounds BMI:     25.78 Pulse rate:   61 / minute BP sitting:   171 / 70  (left arm) Cuff size:   large  Vitals Entered By: Carlye Grippe (October 02, 2010 1:01 PM)  Nutrition Counseling: Patient's BMI is greater than 25 and therefore counseled on weight management options.  Serial Vital Signs/Assessments:  Time      Position  BP       Pulse  Resp  Temp     By 1:09 PM             163/81   66                    Carlye Grippe  Comments: 1:09 PM recheck left arm/large cuff By: Carlye Grippe    Physical Exam  General:  the patient actually looks quite good today. Head:  the area of trauma to her face is resolved. Eyes:  no xanthelasma. Neck:  no  jugular venous distention. Chest Wall:  no chest wall tenderness. Lungs:  lungs are clear.  Respiratory effort is not labored. Heart:  cardiac exam reveals an S1-S2.  There no clicks or significant murmurs. Abdomen:  abdomen is soft. Msk:  no musculoskeletal deformities. Extremities:  no peripheral edema. Skin:  no skin rashes Psych:  patient is oriented to person time and place.  Affect is normal.   PPM Specifications Following MD:  Hillis Range, MD     PPM Vendor:  St Jude     PPM Model Number:  [id number]     PPM Serial Number:  [id number] PPM DOI:  06/01/2009     PPM Implanting MD:  Hillis Range, MD  Lead 1    Location: [facility]     DOI: 06/01/2009     Model #: [id number]     Serial #: [id number]     Status: active Lead 2    Location: [facility]     DOI: 06/01/2009     Model #: 1948     Serial #: [id number]  Status: active  Magnet Response Rate:  BOL 100 ERI 85    PPM Follow Up Pacer Dependent:  No      Episodes Coumadin:  Yes  Parameters Mode:  DDDR     Lower Rate Limit:  60     Upper Rate Limit:  120 Paced AV Delay:  180     Sensed AV Delay:  160  Impression & Recommendations:  Problem # 1:  ATRIAL FIBRILLATION (ICD-427.31) EKG is done today and reviewed by me.  She has sinus rhythm that is paced.  I'm hoping that she will remain in sinus rhythm.  If she reverts to atrial fib we will have to consider either atrial fibrillation or AV node ablation or ticksyn.  Problem # 2:  * TACHYCARDIA INDUCED CARDIOMYOPATHY the patient has developed left ventricular dysfunction on 2 occasions from her atrial fibrillation.  Her most recent echo reveals improvement in LV function while in sinus rhythm.  We will do everything we can to keep her in sinus rhythm.  Problem # 3:  ORTHOSTATIC HYPOTENSION, HX OF (ICD-V12.50) Her most recent significant problem was orthostatic hypotension.  She is on Midrin.  This will be continued for now.  Problem # 4:  HYPOXEMIA (ICD-799.02) The patient hypoxemia has improved  since being at home and she is now off oxygen.  Problem # 5:  * FLUID OVERLOAD Her volume status is stable.  No change in therapy.  Problem # 6:  * AMIODARONE THERAPY The patient has been tried on amiodarone on 2 occasions.  It is now been stopped and will not be restarted.  It was effective for her rhythm.  However it was felt that it was causing severe orthostasis.  Problem # 7:  PACEMAKER, PERMANENT (ICD-V45.01) The patient's pacemaker is working well.  No change in therapy.  Problem # 8:  RENAL INSUFFICIENCY (ICD-588.9) The patient's renal function stabilized while in the hospital.  She was discharged with a creatinine of 1.38 which is good for her.  Problem # 9:  COUMADIN THERAPY (ICD-V58.61) The patient's Coumadin was stopped with her recent fall.  If her orthostasis is completely controlled and she is completely stable we will consider restarting Coumadin  Problem # 10:  HYPOTHYROIDISM (ICD-244.9)  Her updated medication list for this problem includes:    Levothyroxine Sodium 75 Mcg Tabs (Levothyroxine sodium) .[name]... Take 1 tablet by mouth once a day Thyroid is treated.  No change in therapy.  Problem # 11:  CAD, NATIVE VESSEL (ICD-414.01) Coronary disease is stable.  No change in therapy.  Other Orders: EKG w/ Interpretation (93000)  Patient Instructions: 1)  Follow up with Dr. Myrtis Ser on Tuesday, November 12, 2010 at 2:45pm. 2)  Your physician recommends that you continue on your current medications as directed. Please refer to the Current Medication list given to you today.

## 2010-10-10 NOTE — Miscellaneous (Signed)
Summary: [facility]   [facility]   Imported By: Roderic Ovens 05/23/2010 13:42:09  _____________________________________________________________________  External Attachment:    Type:   Image     Comment:   External Document

## 2010-10-10 NOTE — Miscellaneous (Signed)
  Clinical Lists Changes  Problems: Added new problem of * RESPIRATORY FAILURE  01/2010 Observations: Added new observation of PAST MED HX: HYPERTENSION, UNSPECIFIED (ICD-401.9) dyslipidemia CAD   cath...04/2009 40% left main,30% LAD/50-60% OM 2,40% right coronary artery , 90% OM1.was culprit lesion non-STEMI.[name] too small for intervention.. EF  55-60%  echo..01/15/2010 in hosp. ATRIAL FIBRILLATION... August, 2010.. with persistent rapid rate and Coumadin started TE cardioversion done 8/23, 2010. Return atrial fib.[name] amiodarone started.. consider outpatient cardioversion later  Beta blockers.[name] during hospitalization August, 2010.  Beta blockers lower blood pressure excessively without heart rate control / [place] atrial flutter with difficult rate control... TEE cardioversion done....ejection fraction 30%.. possibly secondary to tachycardia.. patient did convert back to sinus rhythm. / sometime after this the patient had return of atrial fibrillation /     outpatient..cardioversion May 31, 2009. [facility]... severe bradycardia.. chest compressions... patient quite stable... patient admitted and pacemaker placed  /  aimodarone used until stopped  01/2010 AMIODARONE RX  2010.Marland Kitchenstopped with respiratory failure  01/2010 (not definitely related) Pacemaker.... St. Jude [employer].DR RF.[name] June 01, 2009 Diabetes Dementia ??? early ?? GERD H/O TIAs Hypothyroidism carotid endarterectomy.Marland KitchenRIGHT Stent to the right peroneal and right superficial as well as right popliteal arteries..Dr. Edilia Bo Chronic kidney disease  ..creatinine  1.37  when D/C  hosp.  01/2010 Anemia of chronic kidney disease Gait disorder Vertebrobasilar insufficiency Depression Coumadin therapy.. started August, 2010 Hyperkalemia... ACE inhibitor held Acute renal insufficiency after cardiac catheterization.. August, 2010 Gastroparesis, abnormal GES 3/11 Right eye blindess Respiratory  failure-hypoxic & hypercapnic  Jeani Hawking  01/2000..(primary etiology probably pneumonia) Leukocytosis  persistent post pneumonia..01/2010 (02/10/2010 14:42) Added new observation of PRIMARY MD: [name], MD (02/10/2010 14:42)       Past History:  Past Medical History: HYPERTENSION, UNSPECIFIED (ICD-401.9) dyslipidemia CAD   cath...04/2009 40% left main,30% LAD/50-60% OM 2,40% right coronary artery , 90% OM1.was culprit lesion non-STEMI.[name] too small for intervention.. EF  55-60%  echo..01/15/2010 in hosp. ATRIAL FIBRILLATION... August, 2010.. with persistent rapid rate and Coumadin started TE cardioversion done 8/23, 2010. Return atrial fib.[name] amiodarone started.. consider outpatient cardioversion later  Beta blockers.[name] during hospitalization August, 2010.  Beta blockers lower blood pressure excessively without heart rate control / hospitalization.[place] atrial flutter with difficult rate control... TEE cardioversion done....ejection fraction 30%.. possibly secondary to tachycardia.. patient did convert back to sinus rhythm. / sometime after this the patient had return of atrial fibrillation /     outpatient..cardioversion May 31, 2009. [facility]... severe bradycardia.. chest compressions... patient quite stable... patient admitted and pacemaker placed  /  aimodarone used until stopped  01/2010 AMIODARONE RX  2010.Marland Kitchenstopped with respiratory failure  01/2010 (not definitely related) Pacemaker.... St. Jude [employer].DR RF.[name] June 01, 2009 Diabetes Dementia ??? early ?? GERD H/O TIAs Hypothyroidism carotid endarterectomy.Marland KitchenRIGHT Stent to the right peroneal and right superficial as well as right popliteal arteries..Dr. Edilia Bo Chronic kidney disease  ..creatinine  1.37  when D/C  hosp.  01/2010 Anemia of chronic kidney disease Gait disorder Vertebrobasilar insufficiency Depression Coumadin therapy.. started August, 2010 Hyperkalemia... ACE inhibitor  held Acute renal insufficiency after cardiac catheterization.. August, 2010 Gastroparesis, abnormal GES 3/11 Right eye blindess Respiratory failure-hypoxic & hypercapnic  Jeani Hawking  01/2000..(primary etiology probably pneumonia) Leukocytosis  persistent post pneumonia..01/2010

## 2010-10-10 NOTE — Miscellaneous (Signed)
Clinical Lists Changes  Problems: Added new problem of ORTHOSTATIC HYPOTENSION, HX OF (ICD-V12.50) Added new problem of VENTRICULAR TACHYCARDIA (ICD-427.1) Added new problem of * TACHYCARDIA INDUCED CARDIOMYOPATHY Added new problem of ATRIAL FIBRILLATION (ICD-427.31) Observations: Added new observation of PAST MED HX: HYPERTENSION, dyslipidemia CAD   cath...04/2009 40% left main,30% LAD/50-60% OM 2,40% right coronary artery , 90% OM1.was culprit lesion non-STEMI.[name] too small for intervention.. EF  55-60%  echo..01/15/2010 / EF 30%.. TEE. tachycardia cardiomyopathy /   EF 50% echo..09/21/2010 ATRIAL FIBRILLATION... 04/2009.. rapid rate..Coumadin started TE cardioversion done 8/23, 2010... atrial fib.[name] amiodarone..Beta blockers.[name] during hospitalization August, 2010.  Beta blockers lower blood pressure excessively without heart rate control / hospitalization.[place] 2009 atrial flutter with difficult rate control... TEE cardioversion done....ejection fraction 30%.. secondary to tachycardia.. patient did convert back to sinus rhythm. / return of atrial fibrillation /     outpatient..cardioversion May 31, 2009. [facility]... severe bradycardia.. chest compressions..pacemaker placed  /  aimodarone used until stopped  01/2010  /  return of atrial flutter October, 2011... amiodarone restarted 06/2010 / cardioversion 08/15/2010 /  amio stopped 09/2010 orthostasis/syncopehe Pacemaker.... St. Jude [employer].DR RF.[name] June 01, 2009 Diabetes Dementia ??? early ?? GERD H/O TIAs Hypothyroidism carotid endarterectomy.Marland KitchenRIGHT PAD  and Stent t right peroneal,  right superficial , right popliteal arteries..Dr. Edilia Bo right mid foot amputation  2010 CKD ..creatinine  1.37   01/2010, 1.38 hospital d/c 09/22/2010 Anemia CKD Gait disorder Vertebrobasilar insufficiency.. Depression Coumadin therapy.. started August, 2010 / stopped 09/2010 orthostasis/syncope / consider restart    Hyperkalemia... ACE inhibitor held Acute renal insufficiency.. catheterization.. 8, 2010 Gastroparesis, abnormal GES 3/11 blindess..R eye Respiratory failure-hypoxic & hypercapnic    01/2010..( etiology  pneumonia) Leukocytosis  persistent post pneumonia..01/2010 Orthostasis.  falling / syncope  09/2010 ... facial trauma ventricular tachycardia 20 beats 08/2011 asymptomatic    (10/02/2010 8:05) Added new observation of REFERRING MD: Cresenzo (10/02/2010 8:05) Added new observation of PRIMARY MD: Loleta Rose (10/02/2010 8:05)       Past History:  Past Medical History: HYPERTENSION, dyslipidemia CAD   cath...04/2009 40% left main,30% LAD/50-60% OM 2,40% right coronary artery , 90% OM1.was culprit lesion non-STEMI.[name] too small for intervention.. EF  55-60%  echo..01/15/2010 / EF 30%.. TEE. tachycardia cardiomyopathy /   EF 50% echo..09/21/2010 ATRIAL FIBRILLATION... 04/2009.. rapid rate..Coumadin started TE cardioversion done 8/23, 2010... atrial fib.[name] amiodarone..Beta blockers.[name] during hospitalization August, 2010.  Beta blockers lower blood pressure excessively without heart rate control / hospitalization.[place] 2009 atrial flutter with difficult rate control... TEE cardioversion done....ejection fraction 30%.. secondary to tachycardia.. patient did convert back to sinus rhythm. / return of atrial fibrillation /     outpatient..cardioversion May 31, 2009. [facility]... severe bradycardia.. chest compressions..pacemaker placed  /  aimodarone used until stopped  01/2010  /  return of atrial flutter October, 2011... amiodarone restarted 06/2010 / cardioversion 08/15/2010 /  amio stopped 09/2010 orthostasis/syncopehe Pacemaker.... St. Jude [employer].DR RF.[name] June 01, 2009 Diabetes Dementia ??? early ?? GERD H/O TIAs Hypothyroidism carotid endarterectomy.Marland KitchenRIGHT PAD  and Stent t right peroneal,  right superficial , right popliteal arteries..Dr.  Edilia Bo right mid foot amputation  2010 CKD ..creatinine  1.37   01/2010, 1.38 hospital d/c 09/22/2010 Anemia CKD Gait disorder Vertebrobasilar insufficiency.. Depression Coumadin therapy.. started August, 2010 / stopped 09/2010 orthostasis/syncope / consider restart  Hyperkalemia... ACE inhibitor held Acute renal insufficiency.. catheterization.. 8, 2010 Gastroparesis, abnormal GES 3/11 blindess..R eye Respiratory failure-hypoxic & hypercapnic    01/2010..( etiology  pneumonia) Leukocytosis  persistent post pneumonia..01/2010 Orthostasis.  falling / syncope  09/2010 ... facial trauma ventricular tachycardia 20 beats 08/2011 asymptomatic

## 2010-10-10 NOTE — Letter (Signed)
Summary: TCS/EGD/ED ORDER  TCS/EGD/ED ORDER   Imported By: Ave Filter 02/11/2010 09:50:57  _____________________________________________________________________  External Attachment:    Type:   Image     Comment:   External Document

## 2010-10-10 NOTE — Medication Information (Signed)
Summary: Coumadin Clinic  Anticoagulant Therapy  Managed by: Brittney [name]  PCP: Loleta Rose Supervising MD: Diona Browner MD, Remi Deter Indication 1: Atrial Fibrillation Lab Used: [facility] Site: [place] INR POC 1.4  Dietary changes: no    Health status changes: no    Bleeding/hemorrhagic complications: no    Recent/future hospitalizations: yes       Details: pending DCCV  Any changes in medication regimen? no    Recent/future dental: no  Any missed doses?: yes     Details: Ran out of coumadin 3-4 days ago  Is patient compliant with meds? yes       Allergies: 1)  ! Oxycodone Hcl 2)  Morphine Sulfate (Morphine Sulfate)  Anticoagulation Management History:      The patient is taking warfarin and comes in today for a routine follow up visit.  Warfarin therapy is being given due to atrial fibrillation requiring cardioversion.  Positive risk factors for bleeding include an age of 75 years or older, history of CVA/TIA, and presence of serious comorbidities.  Negative risk factors for bleeding include no history of GI bleeding.  The bleeding index is 'high risk'.  Positive CHADS2 values include History of HTN, Age > 34 years old, History of Diabetes, and Prior Stroke/CVA/TIA.  Negative CHADS2 values include History of CHF.  The start date was 04/29/2009.  Her last INR was 2.02.  Anticoagulation responsible provider: Diona Browner MD, Remi Deter.  INR POC: 1.4.  Cuvette Lot#: [id number].  Exp: 10/11.    Anticoagulation Management Assessment/Plan:      The patient's current anticoagulation dose is Warfarin sodium 5 mg tabs: use as directed.  The target INR is 2.0-3.0.  The next INR is due 07/23/2010.  Anticoagulation instructions were given to patient.  Results were reviewed/authorized by Brittney [name] .  She was notified by Brittney [name] .         Prior Anticoagulation Instructions: INR 3.1 Hold coumadin tonight then resume 2.5mg  once daily   Current Anticoagulation  Instructions: INR 1.4 Has been out of coumadin x 3 -4 days Take coumadin 5mg  tonight then resume 2.5mg  once daily

## 2010-10-10 NOTE — Medication Information (Signed)
Summary: [facility]  Anticoagulant Therapy  Managed by: Vashti Hey, RN PCP: Loleta Rose Supervising MD: [name] MD, Molly Maduro Indication 1: Atrial Fibrillation Lab Used: [facility] [place] Site: [place] INR POC 6.9           Allergies: 1)  ! Oxycodone Hcl 2)  Morphine Sulfate (Morphine Sulfate)  Anticoagulation Management History:      The patient is taking warfarin and comes in today for a routine follow up visit.  Anticoagulation is being administered due to atrial fibrillation requiring cardioversion.  Positive risk factors for bleeding include an age of 75 years or older, history of CVA/TIA, and presence of serious comorbidities.  Negative risk factors for bleeding include no history of GI bleeding.  The bleeding index is 'high risk'.  Positive CHADS2 values include History of HTN, Age > 75 years old, History of Diabetes, and Prior Stroke/CVA/TIA.  Negative CHADS2 values include History of CHF.  The start date was 04/29/2009.  Her last INR was 2.02.  Anticoagulation responsible provider: [name] MD, Molly Maduro.  INR POC: 6.9.  Exp: 10/11.    Anticoagulation Management Assessment/Plan:      The patient's current anticoagulation dose is Warfarin sodium 2.5 mg tabs: take as directed per coumadin clinic.  The target INR is 2.0-3.0.  The next INR is due 09/16/2010.  Anticoagulation instructions were given to patient.  Results were reviewed/authorized by Vashti Hey, RN.  She was notified by Vashti Hey RN.         Prior Anticoagulation Instructions: INR 2.9 Continue coumadin 2.5mg  once daily  +  Current Anticoagulation Instructions: INR 6.9 Hold coumadin Thurs, Fri and Sat.  Give 1/2 tablet Sundays and [facility] to recheck INR on 09/16/10 Carolin Guernsey RN notified of INR check Bleeding precautions discussed with pt/daughter

## 2010-10-10 NOTE — Assessment & Plan Note (Signed)
Summary: PER AMBER ADD   Visit Type:  Pacemaker check Referring Provider:  Cresenzo Primary Provider:  Loleta Rose   History of Present Illness: The patient presents today for routine electrophysiology followup.  She was recently hospitalized with respiratory failure/ pneumonia and atrial fibrillation with RVR.  She was initiated on amiodarone and cardioverted.  She presents today for follow-up. Unfortunately, she has returned to atrial flutter.  She is unaware of this.  She reports stable but significant SOB and requires O2 at home.  The patient denies symptoms of palpitations, chest pain, , orthopnea, PND, lower extremity edema, dizziness, presyncope, syncope, or neurologic sequela. The patient is tolerating medications without difficulties and is otherwise without complaint today.   Preventive Screening-Counseling & Management  Alcohol-Tobacco     Smoking Status: never  Current Medications (verified): 1)  Nitroglycerin 0.4 Mg Subl (Nitroglycerin) .... Place 1 Tablet Under Tongue As Directed 2)  Sertraline Hcl 100 Mg Tabs (Sertraline Hcl) .... Take 1 Tablet By Mouth Once A Day 3)  Glipizide 5 Mg Tabs (Glipizide) .... Take 1 Tablet By Mouth Two Times A Day 4)  Vitamin C 1000 Mg Tabs (Ascorbic Acid) .... Take 1 Tablet By Mouth Once A Day 5)  Vitamin B-6 100 Mg Tabs (Pyridoxine Hcl) .... Take 1 Tablet By Mouth Once A Day 6)  Lantus 100 Unit/ml Soln (Insulin Glargine) .[name].. 10 Units Every Morning & 25 At Bedtime 7)  Levothyroxine Sodium 75 Mcg Tabs (Levothyroxine Sodium) .... Take 1 Tablet By Mouth Once A Day 8)  Warfarin Sodium 5 Mg Tabs (Warfarin Sodium) .... Take As Directed Per [facility] 9)  Travatan Z 0.004 % Soln (Travoprost) .... Both Eyes Once Daily 10)  Exelon 4.6 Mg/24hr Pt24 (Rivastigmine) .... Take 1 Tablet By Mouth Once A Day 11)  Furosemide 20 Mg Tabs (Furosemide) .... Take 1 Tablet By Mouth Two Times A Day 12)  Amiodarone Hcl 400 Mg Tabs (Amiodarone Hcl) ....  Take One Tablet By Mouth Once Daily. 13)  Aspir-Low 81 Mg Tbec (Aspirin) .... Take 1 Tablet By Mouth Once A Day 14)  Lisinopril 5 Mg Tabs (Lisinopril) .... Take 1 Tablet By Mouth Once A Day 15)  Toprol Xl 25 Mg Xr24h-Tab (Metoprolol Succinate) .... Take 1 Tablet By Mouth Once A Day  Allergies (verified): 1)  ! Oxycodone Hcl 2)  Morphine Sulfate (Morphine Sulfate)  Comments:  Nurse/Medical Assistant: The patient's medication d/c summary lis tand allergies were reviewed with the patient and were updated in the Medication and Allergy Lists.  Past History:  Past Medical History: Reviewed history from 08/05/2010 and no changes required. HYPERTENSION, UNSPECIFIED (ICD-401.9) dyslipidemia CAD   cath...04/2009 40% left main,30% LAD/50-60% OM 2,40% right coronary artery , 90% OM1.was culprit lesion non-STEMI.[name] too small for intervention.. EF  55-60%  echo..01/15/2010 in hosp. ATRIAL FIBRILLATION... 04/2009.. rapid rate..Coumadin started TE cardioversion done 8/23, 2010. Return atrial fib.[name] amiodarone started.. consider outpatient cardioversion later  Beta blockers.[name] during hospitalization August, 2010.  Beta blockers lower blood pressure excessively without heart rate control / hospitalization.[place] 2009 atrial flutter with difficult rate control... TEE cardioversion done....ejection fraction 30%.. possibly secondary to tachycardia.. patient did convert back to sinus rhythm. / sometime after this the patient had return of atrial fibrillation /     outpatient..cardioversion May 31, 2009. [facility]... severe bradycardia.. chest compressions... patient quite stable... patient admitted and pacemaker placed  /  aimodarone used until stopped  01/2010  /  return of atrial flutter October, 2011... amiodarone  restarted AMIODARONE RX  2010.Marland Kitchenstopped with respiratory failure  01/2010 (not definitely related) Pacemaker.... St. Jude [employer].DR RF.[name] June 01, 2009 Diabetes Dementia ??? early ?? GERD H/O TIAs Hypothyroidism carotid endarterectomy.Marland KitchenRIGHT Stent to the right peroneal and right superficial as well as right popliteal arteries..Dr. Edilia Bo Chronic kidney disease  ..creatinine  1.37  when D/C  hosp.  01/2010 Anemia of chronic kidney disease Gait disorder Vertebrobasilar insufficiency.. Depression Coumadin therapy.. started August, 2010 Hyperkalemia... ACE inhibitor held Acute renal insufficiency after cardiac catheterization.. August, 2010 Gastroparesis, abnormal GES 3/11 Right eye blindess.[name] Respiratory failure-hypoxic & hypercapnic  Jeani Hawking  01/2010..(primary etiology probably pneumonia) Leukocytosis  persistent post pneumonia..01/2010  Past Surgical History: Reviewed history from 02/01/2010 and no changes required. Abdominal Hysterectomy-Total Cholecystectomy Carotid Endarterectomy Right midfoot amputation, 2010 Tonsillectomy Several right eye surgeries status post PCI stent to the right peroneal and right superficial as well as right popliteal arteries..Dr. Edilia Bo [facility] Pacemaker implant 9/10  Social History: Reviewed history from 11/29/2009 and no changes required. Widowed, three Tobacco Use - No.  Alcohol Use - no Regular Exercise - yes Drug Use - no  Review of Systems       All systems are reviewed and negative except as listed in the HPI.   Vital Signs:  Patient profile:   77 year old female Height:      67 inches Weight:      170 pounds Pulse rate:   94 / minute BP sitting:   96 / 63  (left arm) Cuff size:   large  Vitals Entered By: Carlye Grippe (August 20, 2010 3:23 PM)  Serial Vital Signs/Assessments:  Time      Position  BP       Pulse  Resp  Temp     By 3:26 PM             103/67   94                    Carlye Grippe  Comments: 3:26 PM right arm/large cuff By: Carlye Grippe    Physical Exam  General:  elderly, chronically ill on O2 Head:  normocephalic and atraumatic Mouth:   Teeth, gums and palate normal. Oral mucosa normal. Neck:  supple Chest Wall:  no chest wall tenderness. Lungs:  prolonged expiratory phase, nl WOB Heart:  tachy irregular rhythm , no m/r/g Abdomen:  Bowel sounds positive; abdomen soft and non-tender without masses, organomegaly, or hernias noted. No hepatosplenomegaly. Msk:  diffuse atrophy Extremities:  trace edema Neurologic:  Alert and oriented x 3. Skin:  Intact without lesions or rashes.   EKG  Procedure date:  08/20/2010  Findings:      typical appearing atrial flutter R rate 100 bpm  PPM Specifications Following MD:  Hillis Range, MD     PPM Vendor:  St Jude     PPM Model Number:  [id number]     PPM Serial Number:  [id number] PPM DOI:  06/01/2009     PPM Implanting MD:  Hillis Range, MD  Lead 1    Location: RA     DOI: 06/01/2009     Model #: [id number]     Serial #: [id number]     Status: active Lead 2    Location: RV     DOI: 06/01/2009     Model #: [id number]     Serial #: [id number]     Status: active  Magnet Response Rate:  BOL 100 ERI 85  PPM Follow Up Battery Voltage:  2.95 V     Battery Est. Longevity:  7.5 yrs     Pacer Dependent:  No       PPM Device Measurements Atrium  Amplitude: 1.2 mV, Impedance: 400 ohms,  Right Ventricle  Amplitude: 12.0 mV, Impedance: 740 ohms, Threshold: 1.0 V at 0.4 msec  Episodes MS Episodes:  9403     Percent Mode Switch:  12%     Coumadin:  Yes Ventricular High Rate:  6     Atrial Pacing:  84%     Ventricular Pacing:  1.7%  Parameters Mode:  DDDR     Lower Rate Limit:  60     Upper Rate Limit:  120 Paced AV Delay:  180     Sensed AV Delay:  160 Next Cardiology Appt Due:  11/07/2010 Tech Comments:  PT IN AFLUTTER. + COUMADIN.  NORMAL DEVICE FUNCTION.  TURNED OFF RV AUTOCAPTURE DUE TO HIGH THRESHOLD TESTING.  CHANGED RV OUTPUT TO 2.5 V.  ATTEMPTED TO PACE OUT OF AFLUTTER BUT UNSUCCESSFUL. PT TO BE ENROLLED IN [place]. ROV IN 3 MTHS W/JA. Vella Kohler  August 20, 2010 4:02 PM MD Comments:   Noninvasive programmed stimulation was attempted today with trains of burst pacing delivered from cycle lengths 320 down to 230 msec without termination of atrial flutter.  Impression & Recommendations:  Problem # 1:  ATRIAL FIBRILLATION, HX OF (ICD-V12.59) The patient has a h/o atrial fibrillation and atrial flutter.  Today she presents in atrial flutter.  Unfortunately, burst pacing could not successfully terminate atrial flutter today.   We will therefore continue amiodarone loading.  I have also restarted cardizem 180mg  daily for rate control. She will continue coumadin. We discussed catheter ablation for atrial flutter as an option.  At this time, the patient prefers medical therapy.  Given her advanced age and comorbidities, I think that this is reasonable.  She will follow-up with Dr Myrtis Ser and I will see her again if she decides to proceed with ablation.  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

## 2010-10-10 NOTE — Consult Note (Signed)
Summary: Consultation Report  Consultation Report   Imported By: Dorise Hiss 01/29/2010 15:14:08  _____________________________________________________________________  External Attachment:    Type:   Image     Comment:   External Document

## 2010-10-10 NOTE — Progress Notes (Signed)
Summary: SOB  Phone Note Call from Patient   Summary of Call: Pt presents for Coumadin Clinic visit. Her dgt states pt has been more short of breath since med changes made at last office visit. She states pt has had some chest pain but is more concerned about worsening SOB. Her weight today was 168.8 lbs. Initial call taken by: Cyril Loosen, RN, BSN,  July 30, 2010 2:11 PM  Follow-up for Phone Call        If the patient seemed stable in coumadin clinic, get a Chest XRay Friday and I will see her Monday.  Talitha Givens, MD, [facility]  July 31, 2010 4:22 PM     Appended Document: SOB Left message to call back on dgt's voicemail.   Appended Document: SOB Pt's dgt notified and verbalized understanding.   Clinical Lists Changes  Orders: Added new Test order of T-Chest x-ray, 2 views (78295) - Signed

## 2010-10-10 NOTE — Progress Notes (Signed)
Summary: Can Home Health Check PT/INR  Phone Note Other Incoming Call back at [phone]   Caller: Aleda Grana, [facility] Summary of Call: Aleda Grana with [facility] called stating pt was recently d/c'd from [facility]. She states pt is on coumadin. She wonders if PT/INR should be checked by them while she is under their care. She states an order can be called to [facility] at [phone]. Initial call taken by: Cyril Loosen, RN, BSN,  Jan 28, 2010 4:44 PM  Follow-up for Phone Call        Spoke with [facility] RN, [facility]. Order given for Home Health RN to check INR's while they are seing pt.  INR will be checked on 01/31/10. Follow-up by: Vashti Hey RN,  Jan 29, 2010 9:58 AM

## 2010-10-10 NOTE — Assessment & Plan Note (Signed)
Summary: EPH D/C [facility] 5-22   Visit Type:  hospital follow-up Primary Provider:  [name], MD  CC:  atrial fibrillation.  History of Present Illness: The patient is seen for followup of atrial fibrillation.  In addition she is seen after a hospitalization at [facility]. She had severe pneumonia with respiratory failure.  During that hospitalization there was question as to whether or not her amiodarone was playing overall.  The overall clinical picture was more compatible with pneumonia.  The hospital is working with her called me and we discussed the situation at length.  We decided that it would still be appropriate to put her amiodarone on hold.  She is here today slowly recovering from her pneumonia.  She is still using home O2 but only with walking.  She remains off amiodarone.  She is not having any significant palpitations..  She's not having chest pain.  I have reviewed the records from her hospitalization at great length.  Preventive Screening-Counseling & Management  Alcohol-Tobacco     Smoking Status: never  Current Medications (verified): 1)  Nitroglycerin 0.4 Mg Subl (Nitroglycerin) .... Place 1 Tablet Under Tongue As Directed 2)  Sertraline Hcl 100 Mg Tabs (Sertraline Hcl) .... Take 1 Tablet By Mouth Once A Day 3)  Bilberry 40 Mg Caps (Bilberry (Vaccinium Myrtillus)) .... Take 1 Tablet By Mouth Once A Day 4)  Lutein 40 Mg Caps (Lutein) .... Take 1 Tablet By Mouth Once A Day 5)  Glucotrol 5 Mg Tabs (Glipizide) .... Take 1 Tablet By Mouth Two Times A Day 6)  Vitamin C 1000 Mg Tabs (Ascorbic Acid) .... Take 1 Tablet By Mouth Once A Day 7)  Vitamin B-6 100 Mg Tabs (Pyridoxine Hcl) .... Take 1 Tablet By Mouth Once A Day 8)  Lantus 100 Unit/ml Soln (Insulin Glargine) .... Use As Directed 9)  Isosorbide Mononitrate Cr 60 Mg Xr24h-Tab (Isosorbide Mononitrate) .... Take One Tablet By Mouth Daily 10)  Levothroid 50 Mcg Tabs (Levothyroxine Sodium) .... Take 1 Tablet By Mouth  Once A Day 11)  Diltiazem Hcl Er Beads 180 Mg Xr24h-Cap (Diltiazem Hcl Er Beads) .... Take One Capsule By Mouth At 4 Pm. 12)  Coumadin 2.5 Mg Tabs (Warfarin Sodium) .... Use As Directed Per Anticoaguation Clinic 13)  Travatan Z 0.004 % Soln (Travoprost) .... Both Eyes Once Daily 14)  Exelon 4.6 Mg/24hr Pt24 (Rivastigmine) .... Take 1 Tablet By Mouth Once A Day 15)  Furosemide 20 Mg Tabs (Furosemide) .... Take 2 Tablet By Mouth Two Times A Day 16)  Potassium Chloride Cr 10 Meq Cr-Caps (Potassium Chloride) .... Take 1 Tablet By Mouth Twice A Day  Allergies (verified): 1)  ! Oxycodone Hcl 2)  Morphine Sulfate (Morphine Sulfate)  Comments:  Nurse/Medical Assistant: The patient's medications and allergies were verbally reviewed with the patient and were updated in the Medication and Allergy Lists.  Past History:  Past Medical History: HYPERTENSION, UNSPECIFIED (ICD-401.9) dyslipidemia CAD   cath...04/2009 40% left main,30% LAD/50-60% OM 2,40% right coronary artery , 90% OM1.was culprit lesion non-STEMI.[name] too small for intervention.. EF  55-60%  echo..01/15/2010 in hosp. ATRIAL FIBRILLATION... August, 2010.. with persistent rapid rate and Coumadin started TE cardioversion done 8/23, 2010. Return atrial fib.[name] amiodarone started.. consider outpatient cardioversion later  Beta blockers.[name] during hospitalization August, 2010.  Beta blockers lower blood pressure excessively without heart rate control / [place] atrial flutter with difficult rate control... TEE cardioversion done....ejection fraction 30%.. possibly secondary to tachycardia.. patient did convert back  to sinus rhythm. / sometime after this the patient had return of atrial fibrillation /     outpatient..cardioversion May 31, 2009. [facility]... severe bradycardia.. chest compressions... patient quite stable... patient admitted and pacemaker placed  /  aimodarone used until stopped   01/2010 AMIODARONE RX  2010.Marland Kitchenstopped with respiratory failure  01/2010 (not definitely related) Pacemaker.... St. Jude Accent.DR RF.[name] June 01, 2009 Diabetes Dementia ??? early ?? GERD H/O TIAs Hypothyroidism carotid endarterectomy.Marland KitchenRIGHT Stent to the right peroneal and right superficial as well as right popliteal arteries..Dr. Edilia Bo Chronic kidney disease  ..creatinine  1.37  when D/C  hosp.  01/2010 Anemia of chronic kidney disease Gait disorder Vertebrobasilar insufficiency Depression Coumadin therapy.. started August, 2010 Hyperkalemia... ACE inhibitor held Acute renal insufficiency after cardiac catheterization.. August, 2010 Gastroparesis, abnormal GES 3/11 Right eye blindess Respiratory failure-hypoxic & hypercapnic  Jeani Hawking  01/2000..(primary etiology probably pneumonia) Leukocytosis  persistent post pneumonia..01/2010  Review of Systems       Patient denies fever, chills, headache, sweats, rash, change in vision, change in hearing, chest pain, nausea vomiting.  She does have some intermittent mild cough.  She has no urinary symptoms.  All other systems are reviewed and are negative.  Vital Signs:  Patient profile:   77 year old female Height:      67 inches Weight:      164 pounds O2 Sat:      97 % on 2 L/min via [place] Pulse rate:   67 / minute BP sitting:   128 / 69  (left arm) Cuff size:   regular  Vitals Entered By: Carlye Grippe (February 11, 2010 2:29 PM)  O2 Flow:  2 L/min via   Physical Exam  General:  patient looks good.  She is still a little weak and she is wearing oxygen at this time. Head:  head is atraumatic. Eyes:  no xanthelasma. Neck:  no jugular venous distention. Chest Wall:  no chest wall tenderness. Lungs:  lungs are clear.  Respiratory effort is nonlabored Heart:  cardiac exam reveals S1 and S2.  There are no clicks or significant murmurs. Abdomen:  abdomen is soft. Msk:  no musculoskeletal deformities. Extremities:  no peripheral  edema. Skin:  no skin rashes. Psych:  patient is oriented to person time and place.  Affect is normal   PPM Specifications Following MD:  Hillis Range, MD     PPM Vendor:  St Jude     PPM Model Number:  [id number]     PPM Serial Number:  [id number] DOI:  06/01/2009     PPM Implanting MD:  Hillis Range, MD  Lead 1    Location: RA     DOI: 06/01/2009     Model #: [id number]     Serial #: [id number]     Status: active Lead 2    Location: RV     DOI: 06/01/2009     Model #: 1948     Serial #: [id number]     Status: active  Magnet Response Rate:  BOL 100 ERI 85    PPM Follow Up Pacer Dependent:  No      Episodes Coumadin:  Yes  Parameters Mode:  DDDR     Lower Rate Limit:  60     Upper Rate Limit:  120 Paced AV Delay:  180     Sensed AV Delay:  160  Impression & Recommendations:  Problem # 1:  * RESPIRATORY FAILURE  01/2010 The patient  is recovering slowly from her recent hospitalization.  She is using her home oxygen Lasix and less.  No change in therapy.  Problem # 2:  * AMIODARONE THERAPY As outlined in the history of present illness decision was made to hold amiodarone at the time of her severe pulmonary illness.  We do not think that the amiodarone was the culprit.  However I am in agreement with continuing to hold the drug.  We needed the drug for her rapid atrial fibrillation before her pacemaker placed.  We will follow now to see how she does without.  Problem # 3:  RENAL INSUFFICIENCY (ICD-588.9) Chemistry will have to be checked today to reassess her renal function.  She is on a diuretic after hospitalization.  She was not on his prehospital.  Problem # 4:  COUMADIN THERAPY (ICD-V58.61) The patient continues on Coumadin.  INR be checked today.  Problem # 5:  ATRIAL FIBRILLATION, HX OF (ICD-V12.59)  Her updated medication list for this problem includes:    Nitroglycerin 0.4 Mg Subl (Nitroglycerin) .[name]... Place 1 tablet under tongue as directed    Isosorbide Mononitrate Cr 60 Mg  Xr24h-tab (Isosorbide mononitrate) .[name]... Take one tablet by mouth daily    Diltiazem Hcl Er Beads 180 Mg Xr24h-cap (Diltiazem hcl er beads) .[name]... Take one capsule by mouth at 4 pm.    Coumadin 2.5 Mg Tabs (Warfarin sodium) ..... Use as directed per anticoaguation clinic  Orders: EKG w/ Interpretation (93000) T-Basic Metabolic Panel ([id number]) T-CBC No Diff ([id number]) EKG is done today reviewed by me.  There appears to be in atrial pacing with a narrow QRS.  No change in therapy.  Problem # 6:  HYPERTENSION, UNSPECIFIED (ICD-401.9)  Her updated medication list for this problem includes:    Diltiazem Hcl Er Beads 180 Mg Xr24h-cap (Diltiazem hcl er beads) .[name]... Take one capsule by mouth at 4 pm.    Furosemide 20 Mg Tabs (Furosemide) .[name]... Take 2 tablet by mouth two times a day Blood pressure is controlled today.  No change in therapy..  I'll see her back in 3 months.  Orders: T-Basic Metabolic Panel [phone]) T-CBC No Diff ([id number])  Patient Instructions: 1)  Your physician wants you to follow-up in: 3 months. You will receive a reminder letter in the mail one-two months in advance. If you don't receive a letter, please call our office to schedule the follow-up appointment. 2)  Your physician recommends that have lab work done. Your home health nurse will draw this next week when she does your coumadin check.  3)  Your physician recommends that you continue on your current medications as directed. Please refer to the Current Medication list given to you today.

## 2010-10-10 NOTE — Letter (Signed)
Summary:  AP   AP   Imported By: Roderic Ovens 09/06/2010 14:30:48  _____________________________________________________________________  External Attachment:    Type:   Image     Comment:   External Document

## 2010-10-10 NOTE — Medication Information (Signed)
Summary: ccr-lr  Anticoagulant Therapy  Managed by: Vashti Hey, RN PCP: Judene Companion MD: Andee Lineman MD, Michelle Piper Indication 1: Atrial Fibrillation Lab Used: [facility] [place] Site: [place] INR POC 2.1  Dietary changes: no    Health status changes: no    Bleeding/hemorrhagic complications: no    Recent/future hospitalizations: no    Any changes in medication regimen? no    Recent/future dental: no  Any missed doses?: no       Is patient compliant with meds? yes       Allergies: 1)  ! Oxycodone Hcl 2)  Morphine Sulfate (Morphine Sulfate)  Anticoagulation Management History:      The patient is taking warfarin and comes in today for a routine follow up visit.  The patient is taking warfarin for atrial fibrillation requiring cardioversion.  Positive risk factors for bleeding include an age of 3 years or older, history of CVA/TIA, and presence of serious comorbidities.  Negative risk factors for bleeding include no history of GI bleeding.  The bleeding index is 'high risk'.  Positive CHADS2 values include History of HTN, Age > 10 years old, History of Diabetes, and Prior Stroke/CVA/TIA.  Negative CHADS2 values include History of CHF.  The start date was 04/29/2009.  Anticoagulation responsible provider: Andee Lineman MD, Michelle Piper.  INR POC: 2.1.  Cuvette Lot#: [id number].  Exp: 10/11.    Anticoagulation Management Assessment/Plan:      The patient's current anticoagulation dose is Warfarin sodium 5 mg tabs: Use as directed by Anticoagulation Clinic.  The target INR is 2.0-3.0.  The next INR is due 01/01/2010.  Anticoagulation instructions were given to daughter.  Results were reviewed/authorized by Vashti Hey, RN.  She was notified by Vashti Hey RN.         Prior Anticoagulation Instructions: INR 2.6 Continue coumadin 2.5mg  once daily except none on Saturdays  Current Anticoagulation Instructions: 2.1 Continue coumadin 2.5mg  qd except none on Saturdays

## 2010-10-10 NOTE — Medication Information (Signed)
Summary: CCR-PENDING DCCV-LAST CK 11/9-KATZ PT-JM  Anticoagulant Therapy  Managed by: Vashti Hey, RN PCP: Loleta Rose Supervising MD: Diona Browner MD, Remi Deter Indication 1: Atrial Fibrillation Lab Used: [facility] [place] Site: [place] INR POC 1.7  Dietary changes: no    Health status changes: no    Bleeding/hemorrhagic complications: no    Recent/future hospitalizations: yes       Details: pending DCCV  Any changes in medication regimen? no    Recent/future dental: no  Any missed doses?: no       Is patient compliant with meds? yes       Allergies: 1)  ! Oxycodone Hcl 2)  Morphine Sulfate (Morphine Sulfate)  Anticoagulation Management History:      The patient is taking warfarin and comes in today for a routine follow up visit.  Warfarin therapy is being given due to atrial fibrillation requiring cardioversion.  Positive risk factors for bleeding include an age of 23 years or older, history of CVA/TIA, and presence of serious comorbidities.  Negative risk factors for bleeding include no history of GI bleeding.  The bleeding index is 'high risk'.  Positive CHADS2 values include History of HTN, Age > 51 years old, History of Diabetes, and Prior Stroke/CVA/TIA.  Negative CHADS2 values include History of CHF.  The start date was 04/29/2009.  Her last INR was 2.02.  Anticoagulation responsible provider: Diona Browner MD, Remi Deter.  INR POC: 1.7.  Cuvette Lot#: [id number].  Exp: 10/11.    Anticoagulation Management Assessment/Plan:      The patient's current anticoagulation dose is Warfarin sodium 5 mg tabs: use as directed.  The target INR is 2.0-3.0.  The next INR is due 07/30/2010.  Anticoagulation instructions were given to patient.  Results were reviewed/authorized by Vashti Hey, RN.  She was notified by Vashti Hey RN.        Coagulation management information includes: 07/08/10  Pending DCCV    Needs weekly INR checks  .  Prior Anticoagulation Instructions: INR 1.4 Has  been out of coumadin x 3 -4 days Take coumadin 5mg  tonight then resume 2.5mg  once daily   Current Anticoagulation Instructions: INR 1.7 Increase coumadin to 2.5mg  once daily except 5mg  on Tuesdays

## 2010-10-10 NOTE — Assessment & Plan Note (Signed)
Summary: pc2- R/S AGAINST ADVISE TOO   Visit Type:  Pacemaker check Referring Provider:  Cresenzo Primary Provider:  Cresenzo   History of Present Illness: The patient presents today for routine electrophysiology followup. She was recently hospitalized for pneumonia.  Her amiodarone was discontinued.  She continues to make steady progress.  The patient denies symptoms of palpitations, chest pain,  orthopnea, PND, lower extremity edema, dizziness, presyncope, syncope, or neurologic sequela.   Her SOB is improving.  The patient is tolerating medications without difficulties and is otherwise without complaint today.   Preventive Screening-Counseling & Management  Alcohol-Tobacco     Smoking Status: never  Current Medications (verified): 1)  Nitroglycerin 0.4 Mg Subl (Nitroglycerin) .... Place 1 Tablet Under Tongue As Directed 2)  Sertraline Hcl 100 Mg Tabs (Sertraline Hcl) .... Take 1 Tablet By Mouth Once A Day 3)  Bilberry 40 Mg Caps (Bilberry (Vaccinium Myrtillus)) .... Take 1 Tablet By Mouth Once A Day 4)  Lutein 40 Mg Caps (Lutein) .... Take 1 Tablet By Mouth Once A Day 5)  Glucotrol 5 Mg Tabs (Glipizide) .... Take 1 Tablet By Mouth Two Times A Day 6)  Vitamin C 1000 Mg Tabs (Ascorbic Acid) .... Take 1 Tablet By Mouth Once A Day 7)  Vitamin B-6 100 Mg Tabs (Pyridoxine Hcl) .... Take 1 Tablet By Mouth Once A Day 8)  Lantus 100 Unit/ml Soln (Insulin Glargine) .... 30 Units Sq Every Night. 9)  Isosorbide Mononitrate Cr 60 Mg Xr24h-Tab (Isosorbide Mononitrate) .... Take One Tablet By Mouth Daily 10)  Levothroid 50 Mcg Tabs (Levothyroxine Sodium) .... Take 1 Tablet By Mouth Once A Day 11)  Diltiazem Hcl Er Beads 180 Mg Xr24h-Cap (Diltiazem Hcl Er Beads) .... Take One Capsule By Mouth At 4 Pm. 12)  Warfarin Sodium 5 Mg Tabs (Warfarin Sodium) .... Use As Directed By Anticoagulation Clinic 13)  Travatan Z 0.004 % Soln (Travoprost) .... Both Eyes Once Daily 14)  Asa 81 Mg .... Take 1 Tablet By  Mouth Once A Day 15)  Exelon 4.6 Mg/24hr Pt24 (Rivastigmine) .... Take 1 Tablet By Mouth Once A Day 16)  Furosemide 20 Mg Tabs (Furosemide) .... Take 1 Tablet By Mouth Two Times A Day 17)  Potassium Chloride Cr 10 Meq Cr-Caps (Potassium Chloride) .... Take 1 Tablet By Mouth Once A Day  Allergies (verified): 1)  ! Oxycodone Hcl 2)  Morphine Sulfate (Morphine Sulfate)  Comments:  Nurse/Medical Assistant: The patient's medications and allergies were reviewed with the patient and were updated in the Medication and Allergy Lists. Verbally reviewed.  Past History:  Past Medical History: HYPERTENSION, UNSPECIFIED (ICD-401.9) dyslipidemia CAD catheterization August, 2010 40% left main/30% LAD/50-60% OM 2/40% right coronary artery/90% OM1.. this was felt to be the culprit lesion for a non-STEMI August, 2010 this vessel was too small for intervention.. Atrial fibrillation... August, 2010.. with persistent rapid rate and Coumadin started TE cardioversion done 8/23, 2010. Return atrial fib.[name] amiodarone started.. consider outpatient cardioversion later  Beta blockers.[name] during hospitalization August, 2010.  Beta blockers lower blood pressure excessively without heart rate control / [place] atrial flutter with difficult rate control... TEE cardioversion done....ejection fraction 30%.. possibly secondary to tachycardia.. patient did convert back to sinus rhythm. / sometime after this the patient had return of atrial fibrillation.  She was in a cardiac rehabilitation and felt poorly with increased heart rate and was admitted to [facility]. Her rate was controlled.  There was no MI.  She was sent home the plan for  outpatient cardioversion.  /  outpatient..cardioversion May 31, 2009. [facility]... severe bradycardia.. chest compressions... patient quite stable... patient admitted and pacemaker placed Pacemaker.... St. Jude Accent.DR RF.[name] June 01, 2009 Persistent atrial fibrillation Diabetes Dementia ??? early ?? GERD H/O TIAs Hypothyroidism Peripheral vascular disease and cerebrovascular disease.. status post right carotid endarterectomy.. status post PCI stent to the right peroneal and right superficial as well as right popliteal arteries..Dr. Edilia Bo Chronic kidney disease Anemia of chronic kidney disease Gait disorder Vertebrobasilar insufficiency Depression Coumadin therapy.. started August, 2010 Hyperkalemia... ACE inhibitor held Acute renal insufficiency after cardiac catheterization.. August, 2010 Gastroparesis, abnormal GES 3/11 Right eye blindess  Past Surgical History: Abdominal Hysterectomy-Total Cholecystectomy Carotid Endarterectomy Right midfoot amputation, 2010 Tonsillectomy Several right eye surgeries status post PCI stent to the right peroneal and right superficial as well as right popliteal arteries..Dr. Edilia Bo [place] [employer] Pacemaker implant 9/10  Vital Signs:  Patient profile:   77 year old female Height:      67 inches Weight:      165 pounds Pulse rate:   72 / minute BP sitting:   136 / 64  Vitals Entered By: Carlye Grippe (Feb 01, 2010 9:07 AM)  Physical Exam  General:  Well developed, well nourished, in no acute distress. Head:  normocephalic and atraumatic Eyes:  PERRLA/EOM intact; conjunctiva and lids normal. Mouth:  Teeth, gums and palate normal. Oral mucosa normal. Neck:  Neck supple, no JVD. No masses, thyromegaly or abnormal cervical nodes. Lungs:  clear Heart:  RRR, no m/r/g Abdomen:  Bowel sounds positive; abdomen soft and non-tender without masses, organomegaly, or hernias noted. No hepatosplenomegaly. Msk:  Back normal, normal gait. Muscle strength and tone normal. Pulses:  pulses normal in all 4 extremities Extremities:  No clubbing or cyanosis. Neurologic:  Alert and oriented x 3.   PPM Specifications Following MD:  Hillis Range, MD     PPM Vendor:  St Jude     PPM Model  Number:  [id number]     PPM Serial Number:  [id number] DOI:  06/01/2009     PPM Implanting MD:  Hillis Range, MD  Lead 1    Location: RA     DOI: 06/01/2009     Model #: [id number]     Serial #: [id number]     Status: active Lead 2    Location: RV     DOI: 06/01/2009     Model #: 1948     Serial #: [id number]     Status: active  Magnet Response Rate:  BOL 100 ERI 85    PPM Follow Up Remote Check?  No Battery Voltage:  2.96 V     Battery Est. Longevity:  8.1 YEARS     Pacer Dependent:  No       PPM Device Measurements Atrium  Amplitude: 4.2 mV, Impedance: 430 ohms, Threshold: 0.625 V at 0.5 msec Right Ventricle  Amplitude: 12 mV, Impedance: 740 ohms, Threshold: 0.75 V at 0.5 msec  Episodes MS Episodes:  11     Percent Mode Switch:  <1%     Coumadin:  Yes Ventricular High Rate:  0     Atrial Pacing:  98%     Ventricular Pacing:  2%  Parameters Mode:  DDDR     Lower Rate Limit:  60     Upper Rate Limit:  120 Paced AV Delay:  180     Sensed AV Delay:  160 Next Cardiology Appt Due:  05/09/2010 Tech Comments:  Normal device function.  Atrial autocapture turned on today.  No other changes made.  All mode switch episodes on 10-12.  Pt on Coumadin therapy.  ROV 9-11 Dr Johney Frame, will start Merlin transmissions at that time. Gypsy Balsam RN BSN  Feb 01, 2010 9:23 AM  MD Comments:  agree  Impression & Recommendations:  Problem # 1:  BRADYCARDIA-TACHYCARDIA SYNDROME (ICD-427.81) Normal pacemaker function continue cardizem  Problem # 2:  ATRIAL FIBRILLATION, HX OF (ICD-V12.59) maintaining sinus off anitarrhythmic medicine we could consider multaq if needed in the future continue coumadin  Problem # 3:  PACEMAKER, PERMANENT (ICD-V45.01) normal function  Patient Instructions: 1)  return 9/11

## 2010-10-10 NOTE — Assessment & Plan Note (Signed)
Summary: 3 WK F/U PER 11/9 OV-JM   Visit Type:  Follow-up Primary Provider:  Loleta Rose  CC:  atrial fibrillation.  History of Present Illness: The patient is seen for followup of her atrial fibrillation.  Amiodarone has been started.  Unfortunately the patient ran out of her Coumadin several weeks ago.  Her INR was less than 2.0.  She is being recoumadinized so that we can proceed with cardioversion.  She is having some shortness of breath.  Her weight is up a few pounds.  Her Lasix dose will be increased.  Preventive Screening-Counseling & Management  Alcohol-Tobacco     Smoking Status: never  Current Medications (verified): 1)  Nitroglycerin 0.4 Mg Subl (Nitroglycerin) .... Place 1 Tablet Under Tongue As Directed 2)  Sertraline Hcl 100 Mg Tabs (Sertraline Hcl) .... Take 1 Tablet By Mouth Once A Day 3)  Glipizide 5 Mg Tabs (Glipizide) .... Take 1 Tablet By Mouth Two Times A Day 4)  Vitamin C 1000 Mg Tabs (Ascorbic Acid) .... Take 1 Tablet By Mouth Once A Day 5)  Vitamin B-6 100 Mg Tabs (Pyridoxine Hcl) .... Take 1 Tablet By Mouth Once A Day 6)  Lantus 100 Unit/ml Soln (Insulin Glargine) .[name].. 10 Units Every Morning & 25 At Bedtime 7)  Levothyroxine Sodium 75 Mcg Tabs (Levothyroxine Sodium) .... Take 1 Tablet By Mouth Once A Day 8)  Diltiazem Hcl Er Beads 180 Mg Xr24h-Cap (Diltiazem Hcl Er Beads) .... Take One Capsule By Mouth Two Times A Day 9)  Warfarin Sodium 5 Mg Tabs (Warfarin Sodium) .... Take As Directed Per [facility] 10)  Travatan Z 0.004 % Soln (Travoprost) .... Both Eyes Once Daily 11)  Exelon 4.6 Mg/24hr Pt24 (Rivastigmine) .... Take 1 Tablet By Mouth Once A Day 12)  Furosemide 20 Mg Tabs (Furosemide) .... Take 1 Tablet By Mouth Once A Day 13)  Amiodarone Hcl 400 Mg Tabs (Amiodarone Hcl) .... Take One Tablet By Mouth Once Daily.  Allergies (verified): 1)  ! Oxycodone Hcl 2)  Morphine Sulfate (Morphine Sulfate)  Past History:  Past Medical  History: HYPERTENSION, UNSPECIFIED (ICD-401.9) dyslipidemia CAD   cath...04/2009 40% left main,30% LAD/50-60% OM 2,40% right coronary artery , 90% OM1.was culprit lesion non-STEMI.[name] too small for intervention.. EF  55-60%  echo..01/15/2010 in hosp. ATRIAL FIBRILLATION... 04/2009.. rapid rate..Coumadin started TE cardioversion done 8/23, 2010. Return atrial fib.[name] amiodarone started.. consider outpatient cardioversion later  Beta blockers.[name] during hospitalization August, 2010.  Beta blockers lower blood pressure excessively without heart rate control / [place] atrial flutter with difficult rate control... TEE cardioversion done....ejection fraction 30%.. possibly secondary to tachycardia.. patient did convert back to sinus rhythm. / sometime after this the patient had return of atrial fibrillation /     outpatient..cardioversion May 31, 2009. [facility]... severe bradycardia.. chest compressions... patient quite stable... patient admitted and pacemaker placed  /  aimodarone used until stopped  01/2010  /  return of atrial flutter October, 2011... amiodarone restarted AMIODARONE RX  2010.Marland Kitchenstopped with respiratory failure  01/2010 (not definitely related) Pacemaker.... St. Jude Accent.DR RF.[name] June 01, 2009 Diabetes Dementia ??? early ?? GERD H/O TIAs Hypothyroidism carotid endarterectomy.Marland KitchenRIGHT Stent to the right peroneal and right superficial as well as right popliteal arteries..Dr. Edilia Bo Chronic kidney disease  ..creatinine  1.37  when D/C  hosp.  01/2010 Anemia of chronic kidney disease Gait disorder Vertebrobasilar insufficiency.. Depression Coumadin therapy.. started August, 2010 Hyperkalemia... ACE inhibitor held Acute renal insufficiency after cardiac catheterization.. August, 2010  Gastroparesis, abnormal GES 3/11 Right eye blindess.[name] Respiratory failure-hypoxic & hypercapnic  Jeani Hawking  01/2010..(primary etiology probably  pneumonia) Leukocytosis  persistent post pneumonia..01/2010  Review of Systems       Patient denies fever, chills, headache, sweats, rash, change in vision, change in hearing, chest pain, cough, nausea vomiting, urinary symptoms.  All other systems are reviewed and are negative.  Vital Signs:  Patient profile:   77 year old female Height:      67 inches Weight:      172.50 pounds Pulse rate:   118 / minute BP sitting:   147 / 78  (left arm) Cuff size:   regular  Vitals Entered By: Hoover Brunette, LPN (August 05, 2010 2:50 PM) CC: atrial fibrillation Is Patient Diabetic? Yes Comments 3 wk. f/u   Physical Exam  General:  The patient is stable. Eyes:  no xanthelasma. Neck:  no jugular venous extension. Lungs:  lungs are clear.  Respiratory effort is nonlabored. Heart:  cardiac exam reveals S1-S2.  There is atrial fibrillation Abdomen:  abdomen is soft. Extremities:  there is 1+ peripheral edema. Psych:  patient is oriented to person time and place her affect is normal.  She is here with her daughter.   PPM Specifications Following MD:  Hillis Range, MD     PPM Vendor:  St Jude     PPM Model Number:  [id number]     PPM Serial Number:  [id number] DOI:  06/01/2009     PPM Implanting MD:  Hillis Range, MD  Lead 1    Location: RA     DOI: 06/01/2009     Model #: [id number]     Serial #: [id number]     Status: active Lead 2    Location: RV     DOI: 06/01/2009     Model #: 1948     Serial #: [id number]     Status: active  Magnet Response Rate:  BOL 100 ERI 85    PPM Follow Up Pacer Dependent:  No      Episodes Coumadin:  Yes  Parameters Mode:  DDDR     Lower Rate Limit:  60     Upper Rate Limit:  120 Paced AV Delay:  180     Sensed AV Delay:  160  Impression & Recommendations:  Problem # 1:  * AMIODARONE THERAPY Amiodarone is being continued.  Problem # 2:  PACEMAKER, PERMANENT (ICD-V45.01) Pacemaker is working well.  Problem # 3:  COUMADIN THERAPY (ICD-V58.61) INRs checked  today it is 2.0.  Her dose will be increased.  She will have very careful followup.  Problem # 4:  * FLUID OVERLOAD The patient is mildly fluid overloaded.  We will increase her diuretic dose slightly.  She'll be seen for early followup.  Hopefully we will be able to proceed with cardioversion over the next several weeks.  Patient Instructions: 1)  Follow up office visit with Dr. Myrtis Ser on Wednesday, Dec 7th at 2:15pm. 2)  Increase Lasix (furosemide) 20mg  by mouth two times a day.  Prescriptions: FUROSEMIDE 20 MG TABS (FUROSEMIDE) Take 1 tablet by mouth two times a day  #60 x 3   Entered by:   Cyril Loosen, RN, BSN   Authorized by:   Talitha Givens, MD, [facility]   Signed by:   Cyril Loosen, RN, BSN on 08/05/2010   Method used:   Electronically to        [facility] (retail)  [address]       [place], [place]  [address]       Ph: [phone]       Fax: [phone]   RxID:   [id number]

## 2010-10-10 NOTE — Assessment & Plan Note (Signed)
Summary: [facility] D/C [facility] 12-8   Visit Type:  Follow-up Referring Provider:  [name] Primary Provider:  Loleta Rose   History of Present Illness: the patient is a 77 year old female followed by Dr. Myrtis Ser. She has a history of atrial flutter and atrial fibrillation. 2 months ago she was restarted on amiodarone. More recently she had a hospitalization for acute congestive heart failure and pneumonia. Her LV function however is normal. She saw Dr. Johney Frame 2 days ago and was found still to be in atrial flutter. There was no response to burst pacing. He added Cardizem to her medical regimen. The patient now presents in normal sinus rhythm, as a matter of fact the patient is ashen atrial paced rhythm. She has received one month of amiodarone 400 twice a day, one month of amiodarone 400 mg once a day and now addition of Cardizem. Her INR is significantly elevated today in the office.  The patient has a history also of coronary artery disease and is  status post prior non-ST elevation myocardial infarction. By catheterization she has a history of 40% left main disease August 2010. Her ejection fraction is 55-60%. She reports no recurrent substernal chest pain.  The patient unfortunately was placed on my schedule around easily and should have been seen by Dr. Myrtis Ser  yesterday.we will make her to just follow up with him in the next couple of months. She also spoke with Dr. Johney Frame in 3 months from now and hopefully she will remain in an organized rhythm and does not need any further ablation. Of note is that the patient does have a significant element of dementia is certainly a conservative strategy seems  appropriate.the patient does have thyroid disease and will need close monitoring of her TSH and free T4.  The patient's daughter was wondering whether she still needs oxygen during the daytime. Her saturation baseline is 98% and had the patient walk for several minutes in the hall. Her saturation never went  below 95%. I told the daughter that she can discontinue O2 during the daytime  Preventive Screening-Counseling & Management  Alcohol-Tobacco     Smoking Status: never  Current Medications (verified): 1)  Nitroglycerin 0.4 Mg Subl (Nitroglycerin) .... Place 1 Tablet Under Tongue As Directed 2)  Sertraline Hcl 100 Mg Tabs (Sertraline Hcl) .... Take 1 Tablet By Mouth Once A Day 3)  Glipizide 5 Mg Tabs (Glipizide) .... Take 1 Tablet By Mouth Two Times A Day 4)  Vitamin C 1000 Mg Tabs (Ascorbic Acid) .... Take 1 Tablet By Mouth Once A Day 5)  Vitamin B-6 100 Mg Tabs (Pyridoxine Hcl) .... Take 1 Tablet By Mouth Once A Day 6)  Lantus 100 Unit/ml Soln (Insulin Glargine) .[name].. 10 Units Every Morning & 25 At Bedtime 7)  Levothyroxine Sodium 75 Mcg Tabs (Levothyroxine Sodium) .... Take 1 Tablet By Mouth Once A Day 8)  Warfarin Sodium 2.5 Mg Tabs (Warfarin Sodium) .... Take As Directed Per Coumadin Clinic 9)  Travatan Z 0.004 % Soln (Travoprost) .... Both Eyes Once Daily 10)  Exelon 4.6 Mg/24hr Pt24 (Rivastigmine) .... Take 1 Tablet By Mouth Once A Day 11)  Furosemide 20 Mg Tabs (Furosemide) .... Take 1 Tablet By Mouth Two Times A Day 12)  Amiodarone Hcl 200 Mg Tabs (Amiodarone Hcl) .... Take 1 Tablet By Mouth Once A Day 13)  Aspir-Low 81 Mg Tbec (Aspirin) .... Take 1 Tablet By Mouth Once A Day 14)  Lisinopril 5 Mg Tabs (Lisinopril) .... Take 1 Tablet By Mouth  Once A Day 15)  Toprol Xl 25 Mg Xr24h-Tab (Metoprolol Succinate) .... Take 1 Tablet By Mouth Once A Day  Allergies: 1)  ! Oxycodone Hcl 2)  Morphine Sulfate (Morphine Sulfate)  Past History:  Past Surgical History: Last updated: 02/01/2010 Abdominal Hysterectomy-Total Cholecystectomy Carotid Endarterectomy Right midfoot amputation, 2010 Tonsillectomy Several right eye surgeries status post PCI stent to the right peroneal and right superficial as well as right popliteal arteries..Dr. Edilia Bo [facility] Pacemaker implant 9/10  Family  History: Last updated: 11/29/2009 Family History of Aortic Aneurysm: Mother Family History of Cancer:  No FH of CRC, colon polyps, liver disease. Father, brain tumor  Social History: Last updated: 11/29/2009 Widowed, three Tobacco Use - No.  Alcohol Use - no Regular Exercise - yes Drug Use - no  Risk Factors: Smoking Status: never (08/22/2010)  Past Medical History: HYPERTENSION, UNSPECIFIED (ICD-401.9) dyslipidemia CAD   cath...04/2009 40% left main,30% LAD/50-60% OM 2,40% right coronary artery , 90% OM1.was culprit lesion non-STEMI.[name] too small for intervention.. EF  55-60%  echo..01/15/2010 in hosp. ATRIAL FIBRILLATION... 04/2009.. rapid rate..Coumadin started TE cardioversion done 8/23, 2010. Return atrial fib.[name] amiodarone started.. consider outpatient cardioversion later  Beta blockers.[name] during hospitalization August, 2010.  Beta blockers lower blood pressure excessively without heart rate control / [place] atrial flutter with difficult rate control... TEE cardioversion done....ejection fraction 30%.. possibly secondary to tachycardia.. patient did convert back to sinus rhythm. / sometime after this the patient had return of atrial fibrillation /     outpatient..cardioversion May 31, 2009. [facility]... severe bradycardia.. chest compressions... patient quite stable... patient admitted and pacemaker placed  /  aimodarone used until stopped  01/2010  /  return of atrial flutter October, 2011... amiodarone restarted AMIODARONE RX  2010.Marland Kitchenstopped with respiratory failure  01/2010 (not definitely related) Pacemaker.... St. Jude Accent.DR RF.[name] June 01, 2009 Diabetes Dementia ??? early ?? GERD H/O TIAs Hypothyroidism carotid endarterectomy.Marland KitchenRIGHT Stent to the right peroneal and right superficial as well as right popliteal arteries..Dr. Edilia Bo Chronic kidney disease  ..creatinine  1.37  when D/C  hosp.  01/2010 Anemia of chronic  kidney disease Gait disorder Vertebrobasilar insufficiency.. Depression Coumadin therapy.. started August, 2010 Hyperkalemia... ACE inhibitor held Acute renal insufficiency after cardiac catheterization.. August, 2010 Gastroparesis, abnormal GES 3/11 Right eye blindess.[name] Respiratory failure-hypoxic & hypercapnic  Jeani Hawking  01/2010..(primary etiology probably pneumonia) Leukocytosis  persistent post pneumonia..01/2010  Review of Systems       The patient complains of fatigue, shortness of breath, and muscle weakness.  The patient denies malaise, fever, weight gain/loss, vision loss, decreased hearing, hoarseness, chest pain, palpitations, prolonged cough, wheezing, sleep apnea, coughing up blood, abdominal pain, blood in stool, nausea, vomiting, diarrhea, heartburn, incontinence, blood in urine, joint pain, leg swelling, rash, skin lesions, headache, fainting, dizziness, depression, anxiety, enlarged lymph nodes, easy bruising or bleeding, and environmental allergies.         dementia  Vital Signs:  Patient profile:   77 year old female Height:      67 inches Weight:      170 pounds O2 Sat:      100 % on 4 L/min via [place] Pulse rate:   64 / minute BP sitting:   123 / 81  (left arm) Cuff size:   regular  Vitals Entered By: Carlye Grippe (August 22, 2010 10:32 AM)  O2 Flow:  4 L/min via [place]  Physical Exam  Additional Exam:  General: Well-developed, well-nourished in no distress head: Normocephalic and atraumatic eyes  PERRLA/EOMI intact, conjunctiva and lids normal nose: No deformity or lesions mouth normal dentition, normal posterior pharynx neck: Supple, no JVD.  No masses, thyromegaly or abnormal cervical nodes lungs: Normal breath sounds bilaterally without wheezing.  Normal percussion heart: regular rate and rhythm with normal S1 and S2, no S3 or S4.  PMI is normal.  No pathological murmurs abdomen: Normal bowel sounds, abdomen is soft and nontender without masses,  organomegaly or hernias noted.  No hepatosplenomegaly musculoskeletal: Back normal, normal gait muscle strength and tone normal pulsus: Pulse is normal in all 4 extremities Extremities: No peripheral pitting edema neurologic: Alert and oriented x 3 skin: Intact without lesions or rashes cervical nodes: No significant adenopathy psychologic: Normal affect    EKG  Procedure date:  08/22/2010  Findings:      atrial paced rhythm.  PPM Specifications Following MD:  Hillis Range, MD     PPM Vendor:  St Jude     PPM Model Number:  [id number]     PPM Serial Number:  [id number] PPM DOI:  06/01/2009     PPM Implanting MD:  Hillis Range, MD  Lead 1    Location: RA     DOI: 06/01/2009     Model #: [id number]     Serial #: [id number]     Status: active Lead 2    Location: RV     DOI: 06/01/2009     Model #: 1948     Serial #: [id number]     Status: active  Magnet Response Rate:  BOL 100 ERI 85    PPM Follow Up Pacer Dependent:  No      Episodes Coumadin:  Yes  Parameters Mode:  DDDR     Lower Rate Limit:  60     Upper Rate Limit:  120 Paced AV Delay:  180     Sensed AV Delay:  160  Impression & Recommendations:  Problem # 1:  PACEMAKER, PERMANENT (ICD-V45.01) the patient status post pacemaker implantation. She is now in an atrial paced rhythm.  Problem # 2:  ATRIAL FIBRILLATION, HX OF (ICD-V12.59) patient appears to be maintaining an atrial paced rhythm on the combination of amiodarone and Cardizem. I decreased her amiodarone to 200 mg a day as it appears that she has been adequately loaded and also to minimize her interaction with Coumadin as well as minimize further possible problems with thyroid abnormalities. The patient also has follow up with Dr. Johney Frame in 3 months. Her updated medication list for this problem includes:    Nitroglycerin 0.4 Mg Subl (Nitroglycerin) .[name]... Place 1 tablet under tongue as directed    Warfarin Sodium 2.5 Mg Tabs (Warfarin sodium) .[name]... Take as directed per  coumadin clinic    Amiodarone Hcl 200 Mg Tabs (Amiodarone hcl) .[name]... Take 1 tablet by mouth once a day    Aspir-low 81 Mg Tbec (Aspirin) .[name]... Take 1 tablet by mouth once a day    Lisinopril 5 Mg Tabs (Lisinopril) .[name]... Take 1 tablet by mouth once a day    Toprol Xl 25 Mg Xr24h-tab (Metoprolol succinate) .[name]... Take 1 tablet by mouth once a day  Orders: [facility] INR (29528) EKG w/ Interpretation (93000)  Problem # 3:  HYPOTHYROIDISM (ICD-244.9) again as noted patient will need heart function studies given her next visit with Dr. Myrtis Ser. Her updated medication list for this problem includes:    Levothyroxine Sodium 75 Mcg Tabs (Levothyroxine sodium) .[name]... Take 1 tablet by mouth once a day  Problem #  4:  COUMADIN THERAPY (ICD-V58.61) I adjusted Coumadin to half a tablet of 2.5 mg a day and hold Coumadin today. She'll follow up in the Coumadin clinic next Tuesday. Orders: [facility] INR (65784)  Problem # 5:  HYPOXEMIA (ICD-799.02) I gave her recommendation to discontinue oxygen during the daytime. There appears to be no drop in exertional oxygen saturation.  Patient Instructions: 1)  Decrease Amiodarone to 200mg  daily 2)  May use oxygen at night only 3)  Follow up with Dr. Myrtis Ser:  Friday, February 17 at 10:30 Prescriptions: WARFARIN SODIUM 2.5 MG TABS (WARFARIN SODIUM) take as directed per coumadin clinic  #30 x 3   Entered by:   Hoover Brunette, LPN   Authorized by:   Lewayne Bunting, MD, [facility]   Signed by:   Hoover Brunette, LPN on 69/62/9528   Method used:   Electronically to        [facility] [phone]* (retail)       [address]       [place], [place]  [address]       Ph: [phone] or [phone]       Fax: [phone]   RxID:   [id number] AMIODARONE HCL 200 MG TABS (AMIODARONE HCL) Take 1 tablet by mouth once a day  #30 x 6   Entered by:   Hoover Brunette, LPN   Authorized by:   Lewayne Bunting, MD, [facility]   Signed by:   Hoover Brunette, LPN on 93/23/5573   Method used:   Electronically  to        [facility] # [id number]* (retail)       [address]       [place], [place]  [address]       Ph: [phone] or [phone]       Fax: [phone]   RxID:   [id number]   Anticoagulant Therapy  Managed by: Vashti Hey, RN PCP: Loleta Rose Supervising MD: Myrtis Ser MD, Tinnie Gens Indication 1: Atrial Fibrillation Lab Used: [facility] [place] Site: [place] INR POC 3.4  Vital Signs: Weight: 170 lbs.  Pulse Rate: 64  Blood Pressure:  123 / 81   Dietary changes: no    Health status changes: no    Bleeding/hemorrhagic complications: no    Recent/future hospitalizations: yes       Details: d/c from [facility] last Thursday  Any changes in medication regimen? yes       Details: see medlist  Recent/future dental: no  Any missed doses?: no       Is patient compliant with meds? yes

## 2010-10-10 NOTE — Medication Information (Signed)
Summary: [facility] D/C [facility] 12/15  Anticoagulant Therapy  Managed by: Vashti Hey, RN PCP: Loleta Rose Supervising MD: Diona Browner MD, Remi Deter Indication 1: Atrial Fibrillation Lab Used: [facility] [place] Site: [place] INR POC 2.9  Dietary changes: no    Health status changes: no    Bleeding/hemorrhagic complications: no    Recent/future hospitalizations: no    Any changes in medication regimen? no    Recent/future dental: no  Any missed doses?: no       Is patient compliant with meds? yes       Allergies: 1)  ! Oxycodone Hcl 2)  Morphine Sulfate (Morphine Sulfate)  Anticoagulation Management History:      The patient is taking warfarin and comes in today for a routine follow up visit.  Warfarin therapy is being given due to atrial fibrillation requiring cardioversion.  Positive risk factors for bleeding include an age of 75 years or older, history of CVA/TIA, and presence of serious comorbidities.  Negative risk factors for bleeding include no history of GI bleeding.  The bleeding index is 'high risk'.  Positive CHADS2 values include History of HTN, Age > 21 years old, History of Diabetes, and Prior Stroke/CVA/TIA.  Negative CHADS2 values include History of CHF.  The start date was 04/29/2009.  Her last INR was 2.02.  Anticoagulation responsible Zulema Pulaski: Diona Browner MD, Remi Deter.  INR POC: 2.9.  Exp: 10/11.    Anticoagulation Management Assessment/Plan:      The patient's current anticoagulation dose is Warfarin sodium 2.5 mg tabs: take as directed per coumadin clinic.  The target INR is 2.0-3.0.  The next INR is due 09/03/2010.  Anticoagulation instructions were given to patient.  Results were reviewed/authorized by Vashti Hey, RN.  She was notified by Vashti Hey RN.        Coagulation management information includes: 08/14/10  S/P DCCV  .  Prior Anticoagulation Instructions: INR 2.0 Take coumadin 5mg  x 2 then resume 2.5mg  once daily except 5mg  on Thursdays and recheck INR  on 08/09/10.  May need to increase dose.  Dr Myrtis Ser wants to keep INR higher for pending DCCV.  Current Anticoagulation Instructions: INR 2.9 Continue coumadin 2.5mg  once daily  +

## 2010-10-10 NOTE — Consult Note (Signed)
Summary: [facility]   [facility]   Imported By: Roderic Ovens 03/23/2010 11:58:55  _____________________________________________________________________  External Attachment:    Type:   Image     Comment:   External Document

## 2010-10-10 NOTE — Medication Information (Signed)
Summary: ccr/rm  Anticoagulant Therapy  Managed by: Vashti Hey RN PCP: [name], MD Supervising MD: Diona Browner MD, Remi Deter Indication 1: Atrial Fibrillation Lab Used: [facility] [place] Site: [place] INR POC 2.8  Dietary changes: no    Health status changes: no    Bleeding/hemorrhagic complications: no    Recent/future hospitalizations: no    Any changes in medication regimen? no    Recent/future dental: no  Any missed doses?: no       Is patient compliant with meds? yes       Allergies: 1)  ! Oxycodone Hcl 2)  Morphine Sulfate (Morphine Sulfate)  Anticoagulation Management History:      The patient is taking warfarin and comes in today for a routine follow up visit.  The patient is on warfarin for atrial fibrillation requiring cardioversion.  Positive risk factors for bleeding include an age of 23 years or older, history of CVA/TIA, and presence of serious comorbidities.  Negative risk factors for bleeding include no history of GI bleeding.  The bleeding index is 'high risk'.  Positive CHADS2 values include History of HTN, Age > 58 years old, History of Diabetes, and Prior Stroke/CVA/TIA.  Negative CHADS2 values include History of CHF.  The start date was 04/29/2009.  Her last INR was 2.02.  Anticoagulation responsible provider: Diona Browner MD, Remi Deter.  INR POC: 2.8.  Cuvette Lot#: [id number].  Exp: 10/11.    Anticoagulation Management Assessment/Plan:      The patient's current anticoagulation dose is Coumadin 2.5 mg tabs: use as directed per Anticoaguation Clinic.  The target INR is 2.0-3.0.  The next INR is due 05/03/2010.  Anticoagulation instructions were given to patient.  Results were reviewed/authorized by Vashti Hey RN.  She was notified by Vashti Hey RN.         Prior Anticoagulation Instructions: Called with results of PT/INR obtained on pt today.  PT 22.7  INR 2.02  Order given for pt to continue coumadin 2.5mg  once daily and [facility] to recheck INR on  03/06/10.  Current Anticoagulation Instructions: INR 2.8 Continue coumadin 2.5mg  once daily

## 2010-10-10 NOTE — Letter (Signed)
Summary: REFERRAL DR [name]  REFERRAL DR [name]   Imported By: Diana Eves 12/06/2009 15:04:28  _____________________________________________________________________  External Attachment:    Type:   Image     Comment:   External Document

## 2010-10-10 NOTE — Assessment & Plan Note (Signed)
Summary: 3 mo fu per jan reminder-srs   Visit Type:  Follow-up Primary Provider:  [name], MD  CC:  atrial fibrillation.  History of Present Illness: The patient is seen for followup of atrial fibrillation.  She is actually doing very well.  In September, 2010, she had a very complicated course with her atrial fibrillation.  We cardioverted her as an outpatient at [facility].  She had severe bradycardia in that setting and required external compression.  This stabilized very nicely and a pacemaker was placed.  Done very well since then.  She does have mild coronary disease.  Catheterization was done in August, 2010.  She actually had a non-STEMI at that time.  It was felt that OM1 was the culprit lesion.  The vessel was felt to be too small for intervention she's not having any chest pain.  She needs aggressive secondary prevention.  She does remain on amiodarone 200 mg daily.  She is also on Coumadin.  Preventive Screening-Counseling & Management  Alcohol-Tobacco     Smoking Status: never  Current Medications (verified): 1)  Nitroglycerin 0.4 Mg Subl (Nitroglycerin) .... Place 1 Tablet Under Tongue As Directed 2)  Sertraline Hcl 100 Mg Tabs (Sertraline Hcl) .... Take 1 Tablet By Mouth Once A Day 3)  Bilberry 40 Mg Caps (Bilberry (Vaccinium Myrtillus)) .... Take 1 Tablet By Mouth Once A Day 4)  Lutein 40 Mg Caps (Lutein) .... Take 1 Tablet By Mouth Once A Day 5)  Glucotrol 5 Mg Tabs (Glipizide) .... Take 1 Tablet By Mouth Two Times A Day 6)  Vitamin C 1000 Mg Tabs (Ascorbic Acid) .... Take 1 Tablet By Mouth Once A Day 7)  Vitamin B-6 500 Mg Tabs (Pyridoxine Hcl) .... Take 1 Tablet By Mouth Once A Day 8)  Lantus 100 Unit/ml Soln (Insulin Glargine) .... 30 Units Sq Every Night. 9)  Acetaminophen 325 Mg Tabs (Acetaminophen) .... Take By Mouth Every 4 Hours As Needed 10)  Amiodarone Hcl 200 Mg Tabs (Amiodarone Hcl) .... Take One Tablet By Mouth Daily Alternating With One-Half. 11)   Diltiazem Hcl Er Beads 360 Mg Xr24h-Cap (Diltiazem Hcl Er Beads) .... Take One Capsule By Mouth Every Morning 12)  Isosorbide Mononitrate Cr 60 Mg Xr24h-Tab (Isosorbide Mononitrate) .... Take One Tablet By Mouth Daily 13)  Levothroid 50 Mcg Tabs (Levothyroxine Sodium) .... Take 1 Tablet By Mouth Once A Day 14)  Diltiazem Hcl Er Beads 180 Mg Xr24h-Cap (Diltiazem Hcl Er Beads) .... Take One Capsule By Mouth At 4 Pm. 15)  Warfarin Sodium 5 Mg Tabs (Warfarin Sodium) .... Use As Directed By Anticoagulation Clinic 16)  Travatan Z 0.004 % Soln (Travoprost) .... Both Eyes Once Daily  Allergies: 1)  ! Oxycodone Hcl 2)  Morphine Sulfate (Morphine Sulfate)  Comments:  Nurse/Medical Assistant: The patient's medications were reviewed with the patient and were updated in the Medication List. Pt verbalized understanding.  Cyril Loosen, RN, BSN (November 14, 2009 12:41 PM)  Past History:  Past Medical History: HYPERTENSION, UNSPECIFIED (ICD-401.9) dyslipidemia CAD .catheterization August, 2010 40% left main/30% LAD/50-60% OM 2/40% right coronary artery/90% OM1.. this was felt to be the culprit lesion for a non-STEMI August, 2010 this vessel was too small for intervention.. Atrial fibrillation... August, 2010.. with persistent rapid rate and Coumadin started TE cardioversion done 8/23, 2010. Return atrial fib.[name] amiodarone started.. consider outpatient cardioversion later Beta blockers.[name] during hospitalization August, 2010.  Beta blockers lower blood pressure excessively without heart rate control / [place]  atrial flutter with difficult rate control... TEE cardioversion done....ejection fraction 30%.. possibly secondary to tachycardia.. patient did convert back to sinus rhythm. / sometime after this the patient had return of atrial fibrillation.  She was in a cardiac rehabilitation and felt poorly with increased heart rate and was admitted [facility] Her rate was  controlled.  There was no MI.  She was sent home the plan for outpatient cardioversion.  /  outpatient..cardioversion May 31, 2009. [facility] severe bradycardia.. chest compressions... patient quite stable... patient admitted and pacemaker placed Pacemaker.... St. Jude Accent.DR RF.[name] June 01, 2009 Amiodarone therapy Diabetes Dementia ??? early ?? GERD Esophageal stricture.... dilatation done Hypothyroidism Diabetes Hypertension Peripheral vascular disease and cerebrovascular disease.. status post right carotid endarterectomy.. status post PCI stent to the right peroneal and right superficial as well as right popliteal arteries..Dr. Edilia Bo Chronic kidney disease Anemia of chronic kidney disease Gait disorder Vertebrobasilar insufficiency Depression Coumadin therapy.. started August, 2010 Hyperkalemia... ACE inhibitor held Acute renal insufficiency after cardiac catheterization.. August, 2010  Review of Systems       Patient denies fever, chills, headache, sweats, rash, change in vision, change in hearing, chest pain, cough, shortness of breath, nausea vomiting, urinary symptoms.  All other systems are reviewed and are negative.  Vital Signs:  Patient profile:   77 year old female Height:      67 inches Weight:      158.50 pounds Pulse rate:   66 / minute BP sitting:   170 / 74  (left arm) Cuff size:   regular  Vitals Entered By: Cyril Loosen, RN, BSN (November 14, 2009 12:37 PM) CC: atrial fibrillation Comments Follow up visit   Physical Exam  General:  patient is quite stable in general. Head:  head is atraumatic. Eyes:  no xanthelasma. Neck:  no jugular venous distention. Chest Wall:  no chest wall tenderness. Lungs:  lungs are clear.  Respiratory effort is unlabored. Heart:  cardiac exam reveals S1 and S2.  There is a soft systolic murmur. Abdomen:  abdomen is soft. Msk:  no musculoskeletal deformities. Extremities:  no peripheral  edema. Skin:  no skin rashes. Psych:  patient is oriented to person time and place.  Affect is normal.   PPM Specifications Following MD:  Hillis Range, MD     PPM Vendor:  St Jude     PPM Model Number:  [id number]     PPM Serial Number:  [id number] PPM DOI:  06/01/2009     PPM Implanting MD:  Hillis Range, MD  Lead 1    Location: [facility]     DOI: 06/01/2009     Model #: [id number]     Serial #: [id number]     Status: active Lead 2    Location: RV     DOI: 06/01/2009     Model #: [id number]     Serial #: [id number]     Status: active  Magnet Response Rate:  BOL 100 ERI 85    PPM Follow Up Pacer Dependent:  No      Episodes Coumadin:  Yes  Parameters Mode:  DDDR     Lower Rate Limit:  60     Upper Rate Limit:  120 Paced AV Delay:  180     Sensed AV Delay:  160  Impression & Recommendations:  Problem # 1:  BRADYCARDIA-TACHYCARDIA SYNDROME (ICD-427.81) Bradycardia was treated with a pacemaker.  She has scheduled pacemaker followup.  Problem # 2:  CAROTID BRUIT (  ICD-785.9) There is history of carotid artery disease.  I will check to see if this is followed by her vascular surgeon or not.  If not we will arrange for followup Dopplers.  Problem # 3:  COUMADIN THERAPY (ICD-V58.61) Coumadin therapy continues.  She is doing well with this.  Problem # 4:  HYPOTHYROIDISM (ICD-244.9)  Her updated medication list for this problem includes:    Levothroid 50 Mcg Tabs (Levothyroxine sodium) .[name]... Take 1 tablet by mouth once a day The patient has hypothyroidism.  She is on thyroid medication.  Orders: T-TSH ([id number])  Problem # 5:  ATRIAL FIBRILLATION, HX OF (ICD-V12.59)  Her updated medication list for this problem includes:    Nitroglycerin 0.4 Mg Subl (Nitroglycerin) .[name]... Place 1 tablet under tongue as directed    Amiodarone Hcl 200 Mg Tabs (Amiodarone hcl) .[name]... Take one tablet by mouth daily alternating with one-half.    Diltiazem Hcl Er Beads 360 Mg Xr24h-cap (Diltiazem hcl er beads) .[name]... Take  one capsule by mouth every morning    Isosorbide Mononitrate Cr 60 Mg Xr24h-tab (Isosorbide mononitrate) .[name]... Take one tablet by mouth daily    Diltiazem Hcl Er Beads 180 Mg Xr24h-cap (Diltiazem hcl er beads) .[name]... Take one capsule by mouth at 4 pm.    Warfarin Sodium 5 Mg Tabs (Warfarin sodium) ..... Use as directed by anticoagulation clinic  Orders: EKG w/ Interpretation ([id number]) EKG is done today and reviewed by me.  She has dual-chamber pacing.  This proves that she is in sinus rhythm at this time.  She is on amiodarone.  She has been on 200 mg.  It is now time to see if she can hold sinus by decreasing the dose to 150 mg by alternating between 200 mg one day and 100 mg on the other day.  Problem # 6:  HYPERTENSION, UNSPECIFIED (ICD-401.9)  Her updated medication list for this problem includes:    Diltiazem Hcl Er Beads 360 Mg Xr24h-cap (Diltiazem hcl er beads) .[name]... Take one capsule by mouth every morning    Diltiazem Hcl Er Beads 180 Mg Xr24h-cap (Diltiazem hcl er beads) .[name]... Take one capsule by mouth at 4 pm. Blood pressure is elevated today.  She will be seeing her primary physician soon for followup.  Problem # 7:  * AMIODARONE THERAPY  it is now time to check labs related to amiodarone therapy.  Thyroid functions and liver functions will be checked.  Orders: T-Hepatic Function ([phone]) T-TSH [phone])  Patient Instructions: 1)  Your physician recommends that you return for lab work [id number] AT THE [facility]. 2)  Your physician has recommended you make the following change in your medication: DECREASE AMIODARONE. ALTERNATE DAILY TAKING ONE TAB ONE DAY THEN THE NEXT DAY TAKE ONE-HALF. 3)  Your physician wants you to follow-up in: 6 MONTHS.  You will receive a reminder letter in the mail about two months in advance. If you don't receive a letter, please call our office to schedule the follow-up appointment.

## 2010-10-10 NOTE — Progress Notes (Signed)
Summary: coumadin management  Phone Note Other Incoming   Caller: Dennison Nancy LPN [facility] Reason for Call: Discuss lab or test results Summary of Call: Called with results of PT/INR obtained on pt today.  PT 22.7  INR 2.02  Order given for pt to continue coumadin 2.5mg  once daily and [facility] to recheck INR on 03/06/10. Initial call taken by: Vashti Hey RN,  February 19, 2010 3:59 PM     Anticoagulant Therapy  Managed by: Vashti Hey RN PCP: [name], MD Supervising MD: Andee Lineman MD, Michelle Piper Indication 1: Atrial Fibrillation Lab Used: [facility] [place] Site: [place] PT 22.7  Dietary changes: no    Health status changes: no    Bleeding/hemorrhagic complications: no    Recent/future hospitalizations: no    Any changes in medication regimen? no    Recent/future dental: no  Any missed doses?: no       Is patient compliant with meds? yes         Anticoagulation Management History:      Her anticoagulation is being managed by telephone today.  The patient is on warfarin for atrial fibrillation requiring cardioversion.  Positive risk factors for bleeding include an age of 73 years or older, history of CVA/TIA, and presence of serious comorbidities.  Negative risk factors for bleeding include no history of GI bleeding.  The bleeding index is 'high risk'.  Positive CHADS2 values include History of HTN, Age > 61 years old, History of Diabetes, and Prior Stroke/CVA/TIA.  Negative CHADS2 values include History of CHF.  The start date was 04/29/2009.  Today's INR is 2.02.  Prothrombin time is 22.7.  Anticoagulation responsible provider: Andee Lineman MD, Michelle Piper.  Exp: 10/11.    Anticoagulation Management Assessment/Plan:      The patient's current anticoagulation dose is Coumadin 2.5 mg tabs: use as directed per Anticoaguation Clinic.  The target INR is 2.0-3.0.  The next INR is due 02/18/2010.  Anticoagulation instructions were given to Dennison Nancy LPN [facility].  Results were reviewed/authorized by  Vashti Hey RN.  She was notified by Dennison Nancy LPN [facility].         Prior Anticoagulation Instructions: INR 2.8 Continue coumadin 2.5mg  once daily.  [facility] will recheck on 02/18/10  Current Anticoagulation Instructions: Called with results of PT/INR obtained on pt today.  PT 22.7  INR 2.02  Order given for pt to continue coumadin 2.5mg  once daily and [facility] to recheck INR on 03/06/10.

## 2010-10-10 NOTE — Medication Information (Signed)
Summary: ccr-lr  Anticoagulant Therapy  Managed by: Vashti Hey, RN PCP: [name], MD Supervising MD: Myrtis Ser MD, Tinnie Gens Indication 1: Atrial Fibrillation Lab Used: [facility] [place] Site: [place] INR POC 2.6  Dietary changes: no    Health status changes: yes       Details: had N/V  liver enzymes are elevated  Dr [name] has ordered test  Bleeding/hemorrhagic complications: no    Recent/future hospitalizations: no    Any changes in medication regimen? no    Recent/future dental: no  Any missed doses?: no       Is patient compliant with meds? yes       Allergies: 1)  ! Oxycodone Hcl 2)  Morphine Sulfate (Morphine Sulfate)  Anticoagulation Management History:      The patient is taking warfarin and comes in today for a routine follow up visit.  Anticoagulation is being administered due to atrial fibrillation requiring cardioversion.  Positive risk factors for bleeding include an age of 75 years or older, history of CVA/TIA, and presence of serious comorbidities.  Negative risk factors for bleeding include no history of GI bleeding.  The bleeding index is 'high risk'.  Positive CHADS2 values include History of HTN, Age > 39 years old, History of Diabetes, and Prior Stroke/CVA/TIA.  Negative CHADS2 values include History of CHF.  The start date was 04/29/2009.  Anticoagulation responsible provider: Myrtis Ser MD, Tinnie Gens.  INR POC: 2.6.  Cuvette Lot#: [id number].  Exp: 10/11.    Anticoagulation Management Assessment/Plan:      The patient's current anticoagulation dose is Warfarin sodium 5 mg tabs: Use as directed by Anticoagulation Clinic.  The target INR is 2.0-3.0.  The next INR is due 11/30/2009.  Anticoagulation instructions were given to daughter.  Results were reviewed/authorized by Vashti Hey, RN.  She was notified by Vashti Hey RN.         Prior Anticoagulation Instructions: INR 3.8 Hold coumadin tonight then resume 2.5mg  once daily except none on   Saturdays  Current Anticoagulation Instructions: INR 2.6 Continue coumadin 2.5mg  once daily except none on Saturdays

## 2010-10-10 NOTE — Medication Information (Signed)
Summary: RX Folder/ DRUG INTERACTION  RX Folder/ DRUG INTERACTION   Imported By: Dorise Hiss 07/17/2010 08:07:43  _____________________________________________________________________  External Attachment:    Type:   Image     Comment:   External Document

## 2010-10-10 NOTE — Medication Information (Signed)
Summary: ccr dsch 1/16  Anticoagulant Therapy  Managed by: Vashti Hey, RN PCP: Loleta Rose Supervising MD: Andee Lineman MD, Michelle Piper Indication 1: Atrial Fibrillation Lab Used: [facility] [place] Site: [place] INR POC 2.3  Dietary changes: no    Health status changes: yes       Details: fell  Bleeding/hemorrhagic complications: no    Recent/future hospitalizations: yes       Details: IN [facility] 1/6 -09/23/10 for fall  Any changes in medication regimen? yes       Details: Coumadin on hold since hospital D/C  Recent/future dental: no  Any missed doses?: no       Is patient compliant with meds? yes       Allergies: 1)  ! Oxycodone Hcl 2)  Morphine Sulfate (Morphine Sulfate)  Anticoagulation Management History:      The patient is taking warfarin and comes in today for a routine follow up visit.  Anticoagulation is being administered due to atrial fibrillation requiring cardioversion.  Positive risk factors for bleeding include an age of 75 years or older, history of CVA/TIA, and presence of serious comorbidities.  Negative risk factors for bleeding include no history of GI bleeding.  The bleeding index is 'high risk'.  Positive CHADS2 values include History of HTN, Age > 58 years old, History of Diabetes, and Prior Stroke/CVA/TIA.  Negative CHADS2 values include History of CHF.  The start date was 04/29/2009.  Her last INR was 2.02.  Anticoagulation responsible provider: Andee Lineman MD, Michelle Piper.  INR POC: 2.3.  Exp: 10/11.    Anticoagulation Management Assessment/Plan:      The patient's current anticoagulation dose is Warfarin sodium 2.5 mg tabs: take as directed per coumadin clinic.  The target INR is 2.0-3.0.  The next INR is due 10/02/2010.  Anticoagulation instructions were given to patient.  Results were reviewed/authorized by Vashti Hey, RN.  She was notified by Vashti Hey RN.         Prior Anticoagulation Instructions: INR 6.9 Hold coumadin Thurs, Fri and Sat.  Give 1/2 tablet  Sundays and [facility] to recheck INR on 09/16/10 Carolin Guernsey RN notified of INR check Bleeding precautions discussed with pt/daughter  Current Anticoagulation Instructions: INR 2.3 Pt off coumadin since hospital d/c on 09/23/10   Discharge INR was 2.63 Pt and daughter assure me she has not received any coumadin  Is taking ASA 81mg   (2 tabs once daily ) Has appt with Dr Myrtis Ser 10/02/10   Will recheck INR then.

## 2010-10-10 NOTE — Letter (Signed)
Summary: COUMADIN INSTRUCTIONS-[facility]  COUMADIN INSTRUCTIONS-[facility]   Imported By: Ave Filter 12/25/2009 14:53:22  _____________________________________________________________________  External Attachment:    Type:   Image     Comment:   External Document

## 2010-10-10 NOTE — Miscellaneous (Signed)
Summary: Home Care Report/ [facility]  Home Care Report/ [facility]   Imported By: Dorise Hiss 09/17/2010 15:11:33  _____________________________________________________________________  External Attachment:    Type:   Image     Comment:   External Document

## 2010-10-10 NOTE — Medication Information (Signed)
Summary: Coumadin Clinic  Anticoagulant Therapy  Managed by: Hoover Brunette, LPN PCP: Loleta Rose Supervising MD: Andee Lineman MD, Michelle Piper Indication 1: Atrial Fibrillation Lab Used: [facility] [place] Site: [place] INR POC 3.4  Dietary changes: no    Health status changes: no    Bleeding/hemorrhagic complications: no    Recent/future hospitalizations: yes       Details: been in hospital for AF and pneumonia   DCCV was 08/14/10  Any changes in medication regimen? no    Recent/future dental: no  Any missed doses?: no       Is patient compliant with meds? yes       Allergies: 1)  ! Oxycodone Hcl 2)  Morphine Sulfate (Morphine Sulfate)  Anticoagulation Management History:      The patient is taking warfarin and comes in today for a routine follow up visit.  Warfarin therapy is being given due to atrial fibrillation requiring cardioversion.  Positive risk factors for bleeding include an age of 75 years or older, history of CVA/TIA, and presence of serious comorbidities.  Negative risk factors for bleeding include no history of GI bleeding.  The bleeding index is 'high risk'.  Positive CHADS2 values include History of HTN, Age > 75 years old, History of Diabetes, and Prior Stroke/CVA/TIA.  Negative CHADS2 values include History of CHF.  The start date was 04/29/2009.  Her last INR was 2.02.  Anticoagulation responsible Tailer Volkert: Andee Lineman MD, Michelle Piper.  INR POC: 3.4.  Exp: 10/11.    Anticoagulation Management Assessment/Plan:      The patient's current anticoagulation dose is Warfarin sodium 2.5 mg tabs: take as directed per coumadin clinic.  The target INR is 2.0-3.0.  The next INR is due 08/27/2010.  Anticoagulation instructions were given to patient.  Results were reviewed/authorized by Hoover Brunette, LPN.  She was notified by Hoover Brunette, LPN.         Prior Anticoagulation Instructions: INR 2.0 Take coumadin 5mg  x 2 then resume 2.5mg  once daily except 5mg  on Thursdays and recheck INR  on 08/09/10.  May need to increase dose.  Dr Myrtis Ser wants to keep INR higher for pending DCCV.  Current Anticoagulation Instructions: INR 3.4 Decrease coumadin to 2.5mg  once daily

## 2010-10-10 NOTE — Letter (Signed)
Summary: CONSULTATION FROM05/31/11  CONSULTATION FROM05/31/11   Imported By: Rexene Alberts 04/24/2010 16:28:43  _____________________________________________________________________  External Attachment:    Type:   Image     Comment:   External Document

## 2010-10-10 NOTE — Medication Information (Signed)
Summary: [id number]  Anticoagulant Therapy  Managed by: Vashti Hey RN PCP: [name], MD Supervising MD: Myrtis Ser MD, Tinnie Gens Indication 1: Atrial Fibrillation Lab Used: [facility] [place] Site: [place] INR POC 2.4  Dietary changes: no    Health status changes: no    Bleeding/hemorrhagic complications: no    Recent/future hospitalizations: no    Any changes in medication regimen? no    Recent/future dental: no  Any missed doses?: no       Is patient compliant with meds? yes       Allergies: 1)  ! Oxycodone Hcl 2)  Morphine Sulfate (Morphine Sulfate)  Anticoagulation Management History:      The patient is taking warfarin and comes in today for a routine follow up visit.  She is being anticoagulated due to atrial fibrillation requiring cardioversion.  Positive risk factors for bleeding include an age of 75 years or older, history of CVA/TIA, and presence of serious comorbidities.  Negative risk factors for bleeding include no history of GI bleeding.  The bleeding index is 'high risk'.  Positive CHADS2 values include History of HTN, Age > 75 years old, History of Diabetes, and Prior Stroke/CVA/TIA.  Negative CHADS2 values include History of CHF.  The start date was 04/29/2009.  Her last INR was 2.02.  Anticoagulation responsible [place] Dingley: Myrtis Ser MD, Tinnie Gens.  INR POC: 2.4.  Cuvette Lot#: [id number].  Exp: 10/11.    Anticoagulation Management Assessment/Plan:      The patient's current anticoagulation dose is Coumadin 2.5 mg tabs: use as directed per Anticoaguation Clinic.  The target INR is 2.0-3.0.  The next INR is due 05/31/2010.  Anticoagulation instructions were given to patient.  Results were reviewed/authorized by Vashti Hey RN.  She was notified by Vashti Hey RN.         Prior Anticoagulation Instructions: INR 2.8 Continue coumadin 2.5mg  once daily   Current Anticoagulation Instructions: INR 2.4 Continue coumadin 2.5mg  once daily

## 2010-10-10 NOTE — Cardiovascular Report (Signed)
Summary: [facility] Device [facility]/ [facility]  Card Device Clinic/ [facility] SUMMARY   Imported By: Dorise Hiss 02/07/2010 10:38:03  _____________________________________________________________________  External Attachment:    Type:   Image     Comment:   External Document

## 2010-10-10 NOTE — Miscellaneous (Signed)
Summary: Orders Update  Clinical Lists Changes  Orders: Added new Test order of T-Hepatic Function ([id number]) - Signed 

## 2010-10-10 NOTE — Letter (Signed)
Summary: Certified Letter-asking to stop potassium and call w/updated num  [facility]  [address]   [place], [place] [address]   Phone: [phone]  Fax: [phone]        July 18, 2010 MRN: [id number]    Brittney [name] [address] [place], [place]  [address]    Dear [name],   Dr. Myrtis Ser asked following your office visit yesterday that you stop your Potassium. I attempted to reach you by phone. I called the number listed in our system for you [phone]) but was told by a female who answered that this was a wrong number.   Please contact our office to give [place] a working number.       Sincerely,   Cyril Loosen, RN, BSN  This letter has been electronically signed by your physician.  Appended Document: Certified Letter-asking to stop potassium and call w/updated num Letter returned not deliverable as addressed.   Unable to reach pt by phone. Will have Misty Stanley notify pt at coumadin appt next week.

## 2010-10-14 ENCOUNTER — Inpatient Hospital Stay (HOSPITAL_COMMUNITY)
Admission: EM | Admit: 2010-10-14 | Discharge: 2010-10-16 | DRG: 202 | Disposition: A | Discharge status: Home Health | Payer: MEDICARE | Attending: Internal Medicine | Admitting: Internal Medicine

## 2010-10-14 ENCOUNTER — Emergency Department (HOSPITAL_COMMUNITY): Payer: MEDICARE

## 2010-10-14 DIAGNOSIS — E785 Hyperlipidemia, unspecified: Secondary | ICD-10-CM | POA: Diagnosis present

## 2010-10-14 DIAGNOSIS — I509 Heart failure, unspecified: Secondary | ICD-10-CM | POA: Diagnosis present

## 2010-10-14 DIAGNOSIS — F3289 Other specified depressive episodes: Secondary | ICD-10-CM | POA: Diagnosis present

## 2010-10-14 DIAGNOSIS — I951 Orthostatic hypotension: Secondary | ICD-10-CM | POA: Diagnosis present

## 2010-10-14 DIAGNOSIS — Z794 Long term (current) use of insulin: Secondary | ICD-10-CM

## 2010-10-14 DIAGNOSIS — E118 Type 2 diabetes mellitus with unspecified complications: Secondary | ICD-10-CM | POA: Diagnosis present

## 2010-10-14 DIAGNOSIS — I4891 Unspecified atrial fibrillation: Secondary | ICD-10-CM | POA: Diagnosis present

## 2010-10-14 DIAGNOSIS — E876 Hypokalemia: Secondary | ICD-10-CM | POA: Diagnosis not present

## 2010-10-14 DIAGNOSIS — Z950 Presence of cardiac pacemaker: Secondary | ICD-10-CM

## 2010-10-14 DIAGNOSIS — N183 Chronic kidney disease, stage 3 unspecified: Secondary | ICD-10-CM | POA: Diagnosis present

## 2010-10-14 DIAGNOSIS — F329 Major depressive disorder, single episode, unspecified: Secondary | ICD-10-CM | POA: Diagnosis present

## 2010-10-14 DIAGNOSIS — F411 Generalized anxiety disorder: Secondary | ICD-10-CM | POA: Diagnosis present

## 2010-10-14 DIAGNOSIS — E039 Hypothyroidism, unspecified: Secondary | ICD-10-CM | POA: Diagnosis present

## 2010-10-14 DIAGNOSIS — J029 Acute pharyngitis, unspecified: Secondary | ICD-10-CM | POA: Diagnosis present

## 2010-10-14 DIAGNOSIS — I5022 Chronic systolic (congestive) heart failure: Secondary | ICD-10-CM | POA: Diagnosis present

## 2010-10-14 DIAGNOSIS — I739 Peripheral vascular disease, unspecified: Secondary | ICD-10-CM | POA: Diagnosis present

## 2010-10-14 DIAGNOSIS — J209 Acute bronchitis, unspecified: Principal | ICD-10-CM | POA: Diagnosis present

## 2010-10-14 DIAGNOSIS — F0390 Unspecified dementia without behavioral disturbance: Secondary | ICD-10-CM | POA: Diagnosis present

## 2010-10-14 LAB — CK TOTAL AND CKMB (NOT AT ARMC): CK, MB: 1.3 ng/mL (ref 0.3–4.0)

## 2010-10-14 LAB — COMPREHENSIVE METABOLIC PANEL
AST: 21 U/L (ref 0–37)
Albumin: 3 g/dL — ABNORMAL LOW (ref 3.5–5.2)
BUN: 16 mg/dL (ref 6–23)
Calcium: 8.9 mg/dL (ref 8.4–10.5)
Creatinine, Ser: 1.15 mg/dL (ref 0.4–1.2)
GFR calc Af Amer: 56 mL/min — ABNORMAL LOW (ref >60)
GFR calc non Af Amer: 46 mL/min — ABNORMAL LOW (ref >60)

## 2010-10-14 LAB — URINALYSIS, ROUTINE W REFLEX MICROSCOPIC
Ketones, ur: NEGATIVE mg/dL
Leukocytes, UA: NEGATIVE
Nitrite: NEGATIVE
Specific Gravity, Urine: >1.03 — ABNORMAL HIGH (ref 1.005–1.030)
Urobilinogen, UA: 0.2 mg/dL (ref 0.0–1.0)
pH: 5.5 (ref 5.0–8.0)

## 2010-10-14 LAB — DIFFERENTIAL
Basophils Relative: 0 % (ref 0–1)
Eosinophils Absolute: 0.2 10*3/uL (ref 0.0–0.7)
Eosinophils Relative: 1 % (ref 0–5)
Monocytes Absolute: 1.2 10*3/uL — ABNORMAL HIGH (ref 0.1–1.0)
Monocytes Relative: 8 % (ref 3–12)
Neutrophils Relative %: 60 % (ref 43–77)

## 2010-10-14 LAB — TROPONIN I: Troponin I: 0.15 ng/mL — ABNORMAL HIGH (ref 0.00–0.06)

## 2010-10-14 LAB — CBC
MCH: 29.4 pg (ref 26.0–34.0)
MCHC: 34.1 g/dL (ref 30.0–36.0)
Platelets: 242 10*3/uL (ref 150–400)

## 2010-10-14 LAB — RAPID STREP SCREEN (MED CTR MEBANE ONLY): Streptococcus, Group A Screen (Direct): NEGATIVE

## 2010-10-14 LAB — LACTIC ACID, PLASMA: Lactic Acid, Venous: 1 mmol/L (ref 0.5–2.2)

## 2010-10-14 LAB — PROCALCITONIN: Procalcitonin: <0.1 ng/mL

## 2010-10-14 LAB — GLUCOSE, CAPILLARY

## 2010-10-14 NOTE — Consult Note (Addendum)
Brittney [name], Brittney [name] NO.:  [id number]  MEDICAL RECORD NO.:  [id number]          PATIENT TYPE:  INP  LOCATION:  A336                          FACILITY:  [facility]  PHYSICIAN:  [name]. [name], MD, FACCDATE OF BIRTH:  [birth year]  DATE OF CONSULTATION:  09/04/2010 DATE OF DISCHARGE:                                CONSULTATION   PRIMARY CARDIOLOGIST:  [name], MD, [facility], in the [facility].  PRIMARY CARE PHYSICIAN:  Dr. [name].  REASON FOR CONSULTATION:  CHF.  HISTORY OF PRESENT ILLNESS:  A 77 year old Caucasian female with recent discharge from [facility] on August 26, 2010, after diagnosis of atrial fibrillation with RVR, systolic heart failure status post TEE cardioversion, continues on Coumadin, sick sinus syndrome with pacemaker, admitted with recurrent shortness of breath and failure, and febrile temperature 99-101 for 3 days prior to admission.  She also has had a recent fall.  The patient admits to some dietary noncompliance, certain fluid accumulation on lower extremities on September 01, 2010, and also felt her heart racing, as a result of this she was seen in the emergency room.  On arrival to ER, the patient's blood pressure is 156/59, temperature 99.5 with a pulse of 69, respirations 22.  The patient was found to have heart failure per chest x-ray and was given Lasix 40 mg IV along with potassium replacement.  She was found to be hypokalemic with potassium of 2.6 which is continuing to be repleted during this hospitalization.  We are asked for further recommendations.  REVIEW OF SYSTEMS:  Positive for shortness of breath, dyspnea on exertion, edema, palpitations, low-grade nausea.  All other systems are reviewed and found to be negative.    Code status is FULL.  PAST MEDICAL HISTORY: 1. AFib with RVR status post TEE cardioversion on August 14, 2010. 2. 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 3. Pneumonia. 4.  Diabetes. 5. Chronic renal insufficiency with a creatinine of 1.5. 6. Status post St. Jude pacemaker secondary to sick sinus syndrome on     September of 2010. 7. Hypertension. 8. Carotid disease. 9. Hypothyroidism. 10.Left renal stent. 11.CAD.     a.     Status post cardiac catheterization August of 2010 with a      40% calcified proximal left main nonobstructive, 90-95% ostial      first OM and 50-60% OM2, medical management was recommended at      that time.  PAST SURGICAL HISTORY:  Dual-chamber pacemaker in September of 2010, carotid endarterectomy, cholecystectomy, and tonsillectomy.  SOCIAL HISTORY:  She lives in [place] with her family.  She does not smoke or drink, or use drugs.  FAMILY HISTORY:  Hypertension, brain tumor, CAD, and diabetes.  Current medications prior to admission: 1. Coumadin for PT/INR. 2. Aspirin 81 mg daily. 3. Amiodarone 400 mg daily. 4. Toprol 250 daily. 5. Lisinopril 5 mg daily. 6. Lasix 20 mg b.i.d. 7. Travatan eyedrops daily to both eyes. 8. Lantus insulin 10-25 units b.i.d. 9. Exelon patch 4.6 daily. 10.Vitamin C and vitamin B6 daily. 11.Zoloft 100 mg daily. 12.Levothroid 75 mcg daily. 13.Glipizide 5 mg b.i.d.  14.Tylenol p.r.n.  ALLERGIES:  To MORPHINE and OXYCODONE.  CURRENT LABORATORY DATA:  Sodium 138, potassium 4.5, chloride 93, CO2 of 36, BUN 35, creatinine 1.6 (1.5 on December 2007, admission at [facility]), glucose 240, hemoglobin 10.3, hematocrit 32.0, white blood cells 11.5, platelets 358, total bili 0.7, alkaline phosphatase 81, AST 24, ALT 22, total protein 7.3, albumin 2.8.  BNP 152, admission was 900, troponin 0.05, 0.03.  She is positive for MRSA.  PT is 23.7, INR 2.1, calcium 9.2, magnesium 1.6, total cholesterol 163, triglycerides 60, HDL 35, LDL 116.  CHEST X-RAY:  CHF with edema (moderate).  EKG paced rhythm at 66 beats per minute.  PHYSICAL EXAMINATION:  VITAL SIGNS:  Blood pressure 147/76, pulse 64, respirations 18,  temperature 98.3, current weight 76.2 kg, discharge weight on August 14, 2010 75.9 kg, admit weight 81.9 kg. GENERAL:  She is awake, alert, oriented, in no acute distress. HEENT:  Head is normocephalic and atraumatic.  Eyes PERRLA. NECK:  Supple with no bruit.  No JVD noted. CARDIOVASCULAR:  Regular rate and rhythm with distant heart sounds. Pulses are 2+ and equal. LUNGS:  Essentially clear to auscultation with bilateral basilar rales minimal. ABDOMEN:  Soft, nontender with 2+ bowel sounds. EXTREMITIES:  Without clubbing, cyanosis, or edema. MUSCULOSKELETAL:  Mild kyphosis is noted. NEUROLOGIC:  Cranial nerves II-XII are grossly intact with exception of being hard-of-hearing.  IMPRESSION: 1. Acute on chronic systolic heart failure recent hospitalization at     [facility] with echo demonstrating ejection fraction of 30-35% now on     Lasix 40 mg IV q.12 with improvement in symptoms, will need higher     dose of Lasix as an outpatient.  The patient's blood pressure     should be low in the setting of low ejection fraction.  Blood     pressure on discharge from [facility] was 112/58. 2. Atrial fibrillation rapid ventricular response status post TEE     cardioversion on August 14, 2010, on amiodarone 200 mg daily, now     at this hospitalization, she has paced rhythm per EKG.  Coumadin     per [facility]. 3. Coronary artery disease status post cath in August of 2010,     revealing nonobstructive disease in the setting of non-ST elevated     myocardial infarction.  Continue aspirin, lisinopril, beta-blocker,     and Toprol and will need to have addition of statin in the setting     of hypercholesterolemia with an LDL of 116 with goal of less than     70. 4. Chronic kidney disease, stage III.  PLAN:  This is a 77 year old Caucasian female with recent discharge from [facility] on August 16, 2010, after diagnosis of AFib with RVR and systolic CHF, tachycardic mediated who is readmitted  to [facility] for recurrence of CHF and AFib.  The patient is currently doing better with IV diuresis.  No verification of pulmonary edema was resolved on discharge.  We will increase diuretics, adjustment of other meds, and close followup, appears to be maintaining in normal sinus rhythm with atrial pacing.  On behalf of the physicians providers of [facility], we would like to thank the [facility] and Dr. [name] for allowing [place] to participate in the care of this patient.     Bettey Mare. Lyman Bishop, NP   ______________________________ [name]. [name], MD, [facility]    KML/MEDQ  D:  09/05/2010  T:  09/06/2010  Job:  [id number]  cc:  Dr. [name]  [facility]  Electronically Signed by Joni Reining NP on 09/10/2010 04:21:37 PM Electronically Signed by [place] Bing MD [facility] on 10/14/2010 08:13:15 AM

## 2010-10-15 DIAGNOSIS — I509 Heart failure, unspecified: Secondary | ICD-10-CM

## 2010-10-15 DIAGNOSIS — I517 Cardiomegaly: Secondary | ICD-10-CM

## 2010-10-15 LAB — GLUCOSE, CAPILLARY
Glucose-Capillary: 90 mg/dL (ref 70–99)
Glucose-Capillary: 250 mg/dL — ABNORMAL HIGH (ref 70–99)
Glucose-Capillary: 38 mg/dL — CL (ref 70–99)
Glucose-Capillary: 269 mg/dL — ABNORMAL HIGH (ref 70–99)

## 2010-10-15 LAB — BASIC METABOLIC PANEL
BUN: 16 mg/dL (ref 6–23)
CO2: 32 mEq/L (ref 19–32)
Chloride: 100 mEq/L (ref 96–112)
Creatinine, Ser: 1.13 mg/dL (ref 0.4–1.2)
Glucose, Bld: 115 mg/dL — ABNORMAL HIGH (ref 70–99)
Potassium: 3.1 mEq/L — ABNORMAL LOW (ref 3.5–5.1)

## 2010-10-15 LAB — CBC
HCT: 33.6 % — ABNORMAL LOW (ref 36.0–46.0)
Hemoglobin: 11.4 g/dL — ABNORMAL LOW (ref 12.0–15.0)
RDW: 14.6 % (ref 11.5–15.5)
WBC: 9.3 10*3/uL (ref 4.0–10.5)

## 2010-10-15 LAB — MRSA PCR SCREENING: MRSA by PCR: POSITIVE — AB

## 2010-10-15 LAB — DIFFERENTIAL
Basophils Absolute: 0 10*3/uL (ref 0.0–0.1)
Basophils Relative: 0 % (ref 0–1)
Eosinophils Relative: 5 % (ref 0–5)
Lymphocytes Relative: 34 % (ref 12–46)
Neutro Abs: 4.7 10*3/uL (ref 1.7–7.7)

## 2010-10-15 LAB — CARDIAC PANEL(CRET KIN+CKTOT+MB+TROPI)
CK, MB: 1.5 ng/mL (ref 0.3–4.0)
CK, MB: 1.8 ng/mL (ref 0.3–4.0)
Total CK: 27 U/L (ref 7–177)
Troponin I: 0.18 ng/mL — ABNORMAL HIGH (ref 0.00–0.06)

## 2010-10-15 LAB — BRAIN NATRIURETIC PEPTIDE: Pro B Natriuretic peptide (BNP): 912 pg/mL — ABNORMAL HIGH (ref 0.0–100.0)

## 2010-10-16 ENCOUNTER — Inpatient Hospital Stay (HOSPITAL_COMMUNITY): Payer: MEDICARE

## 2010-10-16 LAB — BASIC METABOLIC PANEL
BUN: 24 mg/dL — ABNORMAL HIGH (ref 6–23)
CO2: 30 mEq/L (ref 19–32)
Calcium: 9 mg/dL (ref 8.4–10.5)
Chloride: 96 mEq/L (ref 96–112)
Creatinine, Ser: 1.26 mg/dL — ABNORMAL HIGH (ref 0.4–1.2)
Glucose, Bld: 294 mg/dL — ABNORMAL HIGH (ref 70–99)

## 2010-10-16 LAB — GLUCOSE, CAPILLARY: Glucose-Capillary: 49 mg/dL — ABNORMAL LOW (ref 70–99)

## 2010-10-16 MED ORDER — IOHEXOL 350 MG/ML SOLN
100.0000 mL | Freq: Once | INTRAVENOUS | Status: AC | PRN
  Administered 2010-10-16: 100 mL via INTRAVENOUS

## 2010-10-17 LAB — URINE CULTURE: Culture  Setup Time: 201202070149

## 2010-10-19 LAB — CULTURE, BLOOD (ROUTINE X 2)

## 2010-10-20 ENCOUNTER — Observation Stay (HOSPITAL_COMMUNITY)
Admission: EM | Admit: 2010-10-20 | Discharge: 2010-10-21 | Disposition: A | Discharge status: Home Health | Payer: MEDICARE | Attending: Internal Medicine | Admitting: Internal Medicine

## 2010-10-20 ENCOUNTER — Emergency Department (HOSPITAL_COMMUNITY): Payer: MEDICARE

## 2010-10-20 DIAGNOSIS — R0789 Other chest pain: Principal | ICD-10-CM | POA: Insufficient documentation

## 2010-10-20 DIAGNOSIS — E039 Hypothyroidism, unspecified: Secondary | ICD-10-CM | POA: Insufficient documentation

## 2010-10-20 DIAGNOSIS — D649 Anemia, unspecified: Secondary | ICD-10-CM | POA: Insufficient documentation

## 2010-10-20 DIAGNOSIS — Z9119 Patient's noncompliance with other medical treatment and regimen: Secondary | ICD-10-CM | POA: Insufficient documentation

## 2010-10-20 DIAGNOSIS — Z950 Presence of cardiac pacemaker: Secondary | ICD-10-CM | POA: Insufficient documentation

## 2010-10-20 DIAGNOSIS — F0390 Unspecified dementia without behavioral disturbance: Secondary | ICD-10-CM | POA: Insufficient documentation

## 2010-10-20 DIAGNOSIS — I509 Heart failure, unspecified: Secondary | ICD-10-CM | POA: Insufficient documentation

## 2010-10-20 DIAGNOSIS — IMO0001 Reserved for inherently not codable concepts without codable children: Secondary | ICD-10-CM | POA: Insufficient documentation

## 2010-10-20 DIAGNOSIS — I5022 Chronic systolic (congestive) heart failure: Secondary | ICD-10-CM | POA: Insufficient documentation

## 2010-10-20 DIAGNOSIS — I4891 Unspecified atrial fibrillation: Secondary | ICD-10-CM | POA: Insufficient documentation

## 2010-10-20 DIAGNOSIS — E871 Hypo-osmolality and hyponatremia: Secondary | ICD-10-CM | POA: Insufficient documentation

## 2010-10-20 DIAGNOSIS — Z79899 Other long term (current) drug therapy: Secondary | ICD-10-CM | POA: Insufficient documentation

## 2010-10-20 DIAGNOSIS — Z91199 Patient's noncompliance with other medical treatment and regimen due to unspecified reason: Secondary | ICD-10-CM | POA: Insufficient documentation

## 2010-10-20 LAB — POCT CARDIAC MARKERS
CKMB, poc: 2.1 ng/mL (ref 1.0–8.0)
Myoglobin, poc: 137 ng/mL (ref 12–200)
Troponin i, poc: <0.05 ng/mL (ref 0.00–0.09)

## 2010-10-20 LAB — BASIC METABOLIC PANEL
CO2: 27 mEq/L (ref 19–32)
Calcium: 8.7 mg/dL (ref 8.4–10.5)
GFR calc Af Amer: 39 mL/min — ABNORMAL LOW (ref >60)
GFR calc non Af Amer: 32 mL/min — ABNORMAL LOW (ref >60)
Glucose, Bld: 477 mg/dL — ABNORMAL HIGH (ref 70–99)
Potassium: 3.9 mEq/L (ref 3.5–5.1)
Sodium: 129 mEq/L — ABNORMAL LOW (ref 135–145)

## 2010-10-20 LAB — DIFFERENTIAL
Basophils Absolute: 0 10*3/uL (ref 0.0–0.1)
Basophils Relative: 0 % (ref 0–1)
Lymphocytes Relative: 47 % — ABNORMAL HIGH (ref 12–46)
Neutro Abs: 3.9 10*3/uL (ref 1.7–7.7)

## 2010-10-20 LAB — GLUCOSE, CAPILLARY

## 2010-10-20 LAB — CBC
HCT: 35.5 % — ABNORMAL LOW (ref 36.0–46.0)
Hemoglobin: 11.9 g/dL — ABNORMAL LOW (ref 12.0–15.0)
MCHC: 33.5 g/dL (ref 30.0–36.0)
RDW: 14.8 % (ref 11.5–15.5)
WBC: 9.1 10*3/uL (ref 4.0–10.5)

## 2010-10-21 LAB — COMPREHENSIVE METABOLIC PANEL
Albumin: 2.9 g/dL — ABNORMAL LOW (ref 3.5–5.2)
Alkaline Phosphatase: 83 U/L (ref 39–117)
BUN: 36 mg/dL — ABNORMAL HIGH (ref 6–23)
Calcium: 9.2 mg/dL (ref 8.4–10.5)
Potassium: 3.7 mEq/L (ref 3.5–5.1)
Sodium: 139 mEq/L (ref 135–145)
Total Protein: 6.7 g/dL (ref 6.0–8.3)

## 2010-10-21 LAB — CBC
Platelets: 355 10*3/uL (ref 150–400)
RDW: 14.8 % (ref 11.5–15.5)
WBC: 9.5 10*3/uL (ref 4.0–10.5)

## 2010-10-21 LAB — CARDIAC PANEL(CRET KIN+CKTOT+MB+TROPI)
CK, MB: 1.6 ng/mL (ref 0.3–4.0)
CK, MB: 1.5 ng/mL (ref 0.3–4.0)
Relative Index: INVALID (ref 0.0–2.5)
Total CK: 25 U/L (ref 7–177)
Total CK: 21 U/L (ref 7–177)
Troponin I: 0.03 ng/mL (ref 0.00–0.06)
Troponin I: 0.07 ng/mL — ABNORMAL HIGH (ref 0.00–0.06)
Troponin I: 0.04 ng/mL (ref 0.00–0.06)

## 2010-10-21 LAB — HEMOGLOBIN A1C
Hgb A1c MFr Bld: 8.7 % — ABNORMAL HIGH (ref <5.7)
Mean Plasma Glucose: 203 mg/dL — ABNORMAL HIGH (ref <117)

## 2010-10-21 LAB — LIPID PANEL
Cholesterol: 210 mg/dL — ABNORMAL HIGH (ref 0–200)
HDL: 28 mg/dL — ABNORMAL LOW (ref >39)
LDL Cholesterol: 136 mg/dL — ABNORMAL HIGH (ref 0–99)
Total CHOL/HDL Ratio: 7.5 RATIO
Triglycerides: 228 mg/dL — ABNORMAL HIGH (ref <150)

## 2010-10-21 LAB — VITAMIN B12: Vitamin B-12: 375 pg/mL (ref 211–911)

## 2010-10-21 LAB — TSH: TSH: 0.99 u[IU]/mL (ref 0.350–4.500)

## 2010-10-21 LAB — GLUCOSE, CAPILLARY: Glucose-Capillary: 358 mg/dL — ABNORMAL HIGH (ref 70–99)

## 2010-10-21 LAB — DIFFERENTIAL
Basophils Absolute: 0 10*3/uL (ref 0.0–0.1)
Eosinophils Absolute: 0.4 10*3/uL (ref 0.0–0.7)
Eosinophils Relative: 5 % (ref 0–5)

## 2010-10-21 LAB — FOLATE: Folate: 9.1 ng/mL

## 2010-10-21 LAB — IRON AND TIBC: TIBC: 341 ug/dL (ref 250–470)

## 2010-10-21 LAB — FERRITIN: Ferritin: 222 ng/mL (ref 10–291)

## 2010-10-22 ENCOUNTER — Encounter: Payer: Self-pay | Admitting: Cardiology

## 2010-10-22 NOTE — Discharge Summary (Signed)
  Brittney [name], Brittney [name]                ACCOUNT NO.:  [id number]  MEDICAL RECORD NO.:  [id number]           PATIENT TYPE:  I  LOCATION:  A313                          FACILITY:  [facility]  PHYSICIAN:  Wilson Singer, M.D.DATE OF BIRTH:  1931/11/16  DATE OF ADMISSION:  10/20/2010 DATE OF DISCHARGE:  02/13/2012LH                         DISCHARGE SUMMARY-REFERRING   FINAL DISCHARGE DIAGNOSIS:  Atypical chest pain, no evidence of cardiac ischemia.  CONDITION ON DISCHARGE:  Stable.  MEDICATIONS ON DISCHARGE:  She will continue her home medications which include: 1. Levothyroxine 75 mcg daily. 2. Lantus insulin 15 units twice a day. 3. Travatan eye drops. 4. Glipizide XR 5 mg b.i.d. 5. Aspirin 325 mg daily. 6. Midodrine 5 mg b.i.d..  HISTORY:  This 77 year old lady was admitted with chest pain.  Please see initial history and physical examination done by Dr. Toniann Fail.  HOSPITAL PROGRESS:  The history of this lady was reviewed by me and she actually describes a less-than-5 minute retrosternal chest pain.  It was not associated with nausea, vomiting, sweating, dyspnea, or loss of consciousness.  The patient is known to have nonobstructive coronary artery disease and most recent ejection fraction was actually quite normal at 55-60% just a week ago.  The patient is also known to have sick sinus syndrome status post pacemaker.  The features of the chest pain also did not point towards a pulmonary origin and the pain was not pleuritic in nature indicative perhaps of pulmonary embolism.  Indeed on physical examination today she looks well.  Vital signs showed temperature of 97.9, blood pressure 132/50, pulse 69, saturation 99% and 2 L oxygen.  Heart sounds are present and normal with no evidence of pericardial rub.  Lung fields are clear with no evidence of pleural rub. Investigations today show sodium 139, potassium 3.7, bicarbonate 30, BUN 36, creatinine 1.38, hemoglobin 11.6, white  blood cell count 9.5, platelets 355.  Electrocardiogram initial one done yesterday did not show any evidence of ST-T wave changes and cardiac enzymes and panel since yesterday have all been negative and in the normal range with no evidence of cardiac ischemia.  DISPOSITION:  The patient is stable to be discharged home.  It is very unclear to me what the etiology of this less-than-5-minute episode of chest pain was, but I think she will need to follow up with her primary care physician Dr. Sherwood Gambler and he may need to investigate this further as an outpatient.  I think she has had maximum benefit from inpatient hospitalization at this point in time.     Wilson Singer, M.D.     NCG/MEDQ  D:  10/21/2010  T:  10/21/2010  Job:  [id number]  cc:   Madelin Rear. Sherwood Gambler, MD Fax: [phone]  Electronically Signed by Lilly Cove M.D. on 10/22/2010 11:37:58 AM

## 2010-10-24 NOTE — Miscellaneous (Signed)
Summary: Home Care Report/ [facility]  Home Care Report/ [facility]   Imported By: Dorise Hiss 10/16/2010 08:32:30  _____________________________________________________________________  External Attachment:    Type:   Image     Comment:   External Document

## 2010-10-25 ENCOUNTER — Ambulatory Visit: Payer: Self-pay | Admitting: Cardiology

## 2010-10-28 NOTE — Discharge Summary (Signed)
Brittney, [name] NO.:  [id number]  MEDICAL RECORD NO.:  [id number]          PATIENT TYPE:  INP  LOCATION:  3739                         FACILITY:  [facility]  PHYSICIAN:  Duke Salvia, MD, FACCDATE OF BIRTH:  [birth year]  DATE OF ADMISSION:  09/13/2010 DATE OF DISCHARGE:  09/23/2010                              DISCHARGE SUMMARY   PRIMARY CARDIOLOGIST:  [name], MD, [facility]  PRIMARY CARE PHYSICIAN:  [name], MD  DISCHARGE DIAGNOSES: 1. Syncope secondary to orthostasis.     a.     The patient's preadmission medications adjusted including      the discontinuation of her antihypertensives and diuretics and      addition of midodrine 5 mg p.o. q.a.m. and q.p.m. after afternoon      nap. 2. Atrial fibrillation/flutter.     a.     Amiodarone therapy discontinued as well as rate control,      Coumadin held while inpatient and resumed upon discharge      (difficult decision as to whether or not Coumadin is appropriate      for anticoagulation given comorbidities including recent fall with      facial trauma).     b.     Status post direct current cardioversion, August 15, 2010. 3. Chronic systolic congestive heart failure secondary to     nonischemic/tachycardia-mediated cardiomyopathy.     a.     A 2-D echocardiogram, September 21, 2010:  Left ventricular      ejection fraction 50% with septal and inferobasal hypokinesis.      Moderate calcification of left coronary cusp.  Mitral valve      annulus calcified with mild mitral regurgitation.  Left atrium,      moderate to severely dilated.     b.     TEE August 14, 2010:  Left ventricular ejection fraction 25-      30% with hypokinesis of the anterior myocardium, anteroseptal      myocardium, and severe hypokinesis of apical myocardium. 4. Facial trauma including right-sided nasal bone fracture secondary     to syncope.     a.     CT of the head without contrast, September 13, 2010:  No      evidence  of traumatic intracranial injury.     b.     CT of maxillofacial bones without contrast, September 13, 2010:      Slightly depressed right-sided nasal bone fracture with minimal      associated soft tissue air and overlying soft tissue swelling.      Scalp hematoma overlying the right frontal calvarium; mild soft      tissue swelling at the anterior vertex.     c.     CT of the cervical spine, September 13, 2010:  No evidence of      fracture or subluxation along the cervical spine.  Mild      degenerative change of the lower cervical spine.  A 1.2-cm left      supraclavicular node noted.  Calcification noted at the carotid  bifurcations bilaterally. 5. History of sick sinus syndrome.     a.     Status post St. Jude Accent PPM, DVR mode, June 01, 2009 (interrogation on September 13, 2010, showed last atrial      fibrillation episode August 24, 2010, no high V rates but 20      beats at 150 beats per minute, no changes deems necessary). 6. Acute on chronic renal insufficiency (chronic kidney disease stage     III-IV) secondary to dehydration.     a.     Creatinine 1.84 on December 30; 2.32 on date of admission      after intravenous fluids and diuretics held, creatinine 1.38 on      September 22, 2010.  SECONDARY DIAGNOSES: 1. History of transient ischemic attacks. 2. Peripheral artery disease.     a.     Status post right carotid endarterectomy.     b.     Stents to right radial, right superficial, and right      popliteal arteries. 3. Hypothyroidism. 4. Hypertension. 5. Dyslipidemia. 6. Anemia of chronic disease. 7. Vertebrobasilar insufficiency. 8. Dementia.  PAST SURGICAL HISTORY: 1. Total abdominal hysterectomy. 2. Cholecystectomy. 3. Right carotid endarterectomy. 4. Right mid foot amputation, 2010. 5. Tonsillectomy. 6. Several eye surgeries. 7. PCI/stenting to the right radial and right superficial as well as     right popliteal arteries, Dr. Edilia Bo. 8. St. Jude  pacemaker implantation, September 2010.  ALLERGIES AND INTOLERANCES: 1. MORPHINE SULFATE (hallucinations). 2. OXYCODONE (itching).  PROCEDURES: 1. EKG, September 13, 2010:  NSR without significant ST-T-wave changes,     no significant Q-waves, normal axis, PR 182, QRS 102, and QTc 466     (compared to prior tracing from August 10, 2010, QTc is slightly     more prolonged and rate and rhythm have changed, previously atrial     fibrillation in low 100s). 2. CT of spine without contrast/CT of the head without contrast/CT of     the maxillofacial bones without contrast, September 13, 2010:  Please     see discharge diagnoses section "facial trauma/nasal bone fracture"     for details. 3. A 2-D echocardiogram, September 21, 2010:  Please see discharge     diagnoses section under chronic systolic CHF secondary to     NICM/tachy mediated CM.  HISTORY OF PRESENT ILLNESS:  Ms. Chahal is a 77 year old Caucasian female with the above-noted complex medical history who was seen on September 13, 2010, at [facility] ED for an episode of syncope that resulted in facial trauma/nasal bone fracture thought to be secondary to orthostasis in the setting of dehydration question of contribution from micturition syncope.  The patient was noted to have elevated creatinine at that time and BNP only at 65.8.  HOSPITAL COURSE:  The patient was admitted and received gentle IV fluid in the setting of known NICM/tachy-mediated cardiomyopathy.  She had her pacemaker interrogated which showed only 20 beats of wide complex tachycardia at 150 bpm which was not thought to be contributing factor to her episode of syncope.  It is noteworthy that her INR was supratherapeutic on admission at 5.76 and this was held initially, because it was supratherapeutic and for the duration of her hospital course secondary to the question of whether or not she continues to be an appropriate candidate for anticoagulation with Coumadin.   Her antihypertensives and diuretics were held due to concern over dehydration as the a potential primary contributing factor to  her episode of syncope.  The patient was noted to be orthostatic when orthostatics were checked and remained orthostatic and symptomatic with sitting or standing until several days after the addition of midodrine. It was thought that the amiodarone therapy is a significant contributing factor to her orthostasis and therefore, amiodarone therapy was discontinued.  This was done ultimately, however, given the need for antiarrhythmic therapy and decreasing her episodes of atrial fibrillation.  Of note, repeat echocardiogram showed significant increase in her EF thought to likely be a benefit from decreased episodes of atrial fibrillation.  The patient showed orthostatic hypotension even on January 14 after discontinuation of her antiarrhythmic therapy, antihypertensives, diuretics, and addition of midodrine with lying blood pressure of 144/59, seated blood pressure 117/59, and standing blood pressure of  96/50.  Note, heart rate while lying 60, heart rate was sitting 76, and heart rate while standing 88. However, after continued hydration, the patient eventually was no longer orthostatic in terms of symptoms and in terms of blood pressure on September 22, 2010, with systolic blood pressure stable at 130 while sitting and standing and 155 while lying.  Note, 3 minutes after initial standing pressure checked, the patient still at 130/71.  Heart rate while lying 63, seated 99, and standing 94, and after 3 minutes of standing, heart rate 89.  Dr. Sherryl Manges was the last cardiologist/electrophysiologist to see Ms. Brittney [name] on September 23, 2010. He deemed her stable for discharge as she no longer had orthostatic pressures or symptoms.  The plan will be for the patient to continue to have Coumadin held, aspirin 182 mg p.o. daily, see Dr. Willa Rough on January 25 at [facility] [place] office to check for dizziness as well as orthostatic pressures.  Should the patient have no recurrence of her orthostasis, another trial of Coumadin therapy may be initiated. The patient has been instructed to take her Midrin in the morning after waking and then again after her afternoon nap.  At the time of discharge, the patient received her new medication list, prescriptions, follow up instructions, and all questions and concerns were addressed prior to leaving the hospital.  DISCHARGE LABORATORY DATA:  WBC 8.9, HGB 11.0, HCT 33.8, and PLT count is 282.  WBC differential on date of admission was within normal limits except for absolute lymphocytes of 4.1.  Protime on in admission 51.5 and INR 5.76.  Protime on date of discharge 28.2 and INR of 2.63. Sodium 139, potassium 4.4, chloride 101, bicarb 33, BUN 28, creatinine 1.3, and glucose 173.  Calcium 9.3.  Point-of-care markers within normal limits.  BNP 65.8.  Only full set of enzymes; CK 258, MB 6.3, and troponin I 0.04.  FOLLOWUP PLANS AND APPOINTMENTS:  Dr. Willa Rough, [facility] in [place], October 02, 2010, at 1 p.m.  DISCHARGE MEDICATIONS: 1. Enteric-coated aspirin 81 mg 2 tabs p.o. daily. 2. Midodrine 5 mg 1 tablet p.o. q.a.m. after waking and 1 tablet p.o.     q.p.m. after afternoon nap. 3. Acetaminophen 500 mg 1-2 tabs p.o. q.6 h p.r.n. 4. Exelon/rivastigmine 4.6 mg patch transdermally daily. 5. Glipizide XL 5 mg 1 tablet p.o. b.i.d. 6. Levothroid/levothyroxine 75 mcg 1 tab p.o. daily. 7. Lantus insulin 15 units subcu injection b.i.d. 8. Sublingual nitroglycerin 0.4 mg q.5 minutes up to 3 doses p.r.n.     for chest discomfort. 9. Sertraline 100 mg 1 tablet p.o. daily. 10.Travatan 0.004% 1 GTTS OU daily.  DURATION OF DISCHARGE ENCOUNTER:  Including physician time  was 35 minutes.     Jarrett Ables, PAC   ______________________________ Duke Salvia, MD, [facility]    MS/MEDQ  D:   09/23/2010  T:  09/24/2010  Job:  [id number]  cc:   [name], MD, [facility] [name], M.D.  Electronically Signed by Jarrett Ables PAC on 09/27/2010 01:33:29 PM Electronically Signed by Sherryl Manges MD [facility] on 10/28/2010 09:49:23 PM

## 2010-10-28 NOTE — Discharge Summary (Signed)
  NAMESYBIL, SHRADER NO.:  [id number]  MEDICAL RECORD NO.:  [id number]          PATIENT TYPE:  INP  LOCATION:  3739                         FACILITY:  [facility]  PHYSICIAN:  Duke Salvia, MD, FACCDATE OF BIRTH:  [birth year]  DATE OF ADMISSION:  09/13/2010 DATE OF DISCHARGE:  09/23/2010                              DISCHARGE SUMMARY   ADDENDUM  SECONDARY DIAGNOSIS: non-insulin-dependent diabetes mellitus.     Jarrett Ables, PAC   ______________________________ Duke Salvia, MD, [facility]    MS/MEDQ  D:  09/23/2010  T:  09/23/2010  Job:  [id number]  Electronically Signed by Jarrett Ables PAC on 10/07/2010 11:32:00 AM Electronically Signed by Sherryl Manges MD FACC on 10/28/2010 09:49:26 PM

## 2010-10-28 NOTE — Consult Note (Signed)
[name], Brittney [name] NO.:  [id number]  MEDICAL RECORD NO.:  [id number]           PATIENT TYPE:  I  LOCATION:  A313                          FACILITY:  [facility]  PHYSICIAN:  [name]. [name], MD, FACCDATE OF BIRTH:  10/22/1931  DATE OF CONSULTATION:  10/15/2010 DATE OF DISCHARGE:                                CONSULTATION   PRIMARY CARDIOLOGIST:  [name], MD, [facility]  PRIMARY CARE PHYSICIAN:  [name], MD  REASON FOR CONSULTATION:  CHF.  HISTORY OF PRESENT ILLNESS:  This is a 77 year old Caucasian female who has a complicated cardiac and medical history who was discharged in January 2012, after admission for syncope and orthostatic hypotension. The patient has a history of paroxysmal atrial fibrillation and has been on amiodarone which was thought to be contributing to her hypotension and therefore was discontinued at that time.  The patient was also found to be dehydrated during initial admission on January 2012, and her Lasix was discontinued.  The patient was started on Minitran and was to followup with Dr. Myrtis Ser in [place].  She did so on October 02, 2010, and was stable.  He continued to have her be off of amiodarone per his notes and he did not restart her on Lasix at that time as she was stable.  The patient came to the emergency room after 3 days of increasing dyspnea, chills, fever, sore throat.  On arrival, the patient's temperature was 101.5.  Her family at home took a temperature and it was 102.  The patient was given IV Lasix per primary and admitted to rule out CHF.  Chest x-ray did show some atypical CHF.  Blood pressure on arrival was 162/54 with a heart rate of 68, respirations 22.  She was also positive for orthostasis with a blood pressure dropping from 161/71 lying to 94/64 standing.  Her pulse did not decrease.  REVIEW OF SYSTEMS:  Positive for fevers and chills, cough and sore throat, mild shortness of breath.  All other systems  are reviewed and are found to be negative.  CODE STATUS:  Full.  PAST MEDICAL HISTORY: 1. Chronic systolic CHF with nonischemic tachycardic mediated     cardiomyopathy with an EF of 25-30% during atrial fibrillation. 2. Paroxysmal atrial fibrillation on Coumadin, but amiodarone was     stopped.     a.     Status post [facility] cardioversion in December 2011. 3. Sick sinus syndrome, status post [employer]. Jude accent pacemaker. 4. History of TIA. 5. History of recurrent pneumonia. 6. Nonobstructive CAD per cardiac catheterization August 2011.     a.     EF 55-60% RCA at a high anterior takeoff.  There was to be a      relatively small nondominant vessel of the left main at 40% ostial      calcification lesion compared with cath films in 2005, was noted      to have minimal progression, LAD system with 30% proximal LAD      stenosis, otherwise only luminal irregularities.  The left      circumflex system revealed a small  high first obtuse marginal with      a 90% ostial stenosis.  There was a moderate sized high second      obtuse marginal with 56% ostial stenosis.  There was a minimal      disease in the third obtuse marginal and there was a large left      PDA with luminal irregularities. 7. Dyslipidemia. 8. Chronic kidney disease stage II-IV with a creatinine on discharge     in January of 1.84. 9. Hypothyroidism. 10.Orthostatic hypotension. 11.Anemia. 12.Peripheral arterial disease.     a.     Stents to the right radial artery, stents to the right      superficial artery and stent to the popliteal arteries on the      right.  PAST SURGICAL HISTORY:  Right carotid endarterectomy, total abdominal hysterectomy, right mid foot amputation and St. Jude pacemaker.  SOCIAL HISTORY:  She lives in [place] with her family.  She does not smoke, drink or use drugs.  FAMILY HISTORY:  Hypertension, but no CAD.  CURRENT MEDICATIONS PRIOR TO ADMISSION: 1. Nitroglycerin. 2. Sertraline HCl 100 mg  daily. 3. Glipizide XL 5 mg b.i.d. 4. Vitamin C 1000 mg daily. 5. Vitamin B6 daily. 6. Lantus insulin 100 units b.i.d. 7. Levothyroxine 75 mcg daily. 8. Travatan to both eyes. 9. Exelon 4.6 patch for dementia. 10.Aspirin 81 mg daily. 11.Midodrine 5 mg b.i.d. 12.Tylenol Extra Strength q.i.d.  ALLERGIES:  To MORPHINE and HYDROCODONE.  CURRENT LABORATORY:  Sodium 140, potassium 3.1, chloride 100, CO2 32, BUN 16, creatinine 1.1, glucose 115, hemoglobin 11.4, hematocrit 33.6, white blood cells 9.3, platelets 242, troponin 0.15, 0.18 and 0.17.  She is MRSA positive.  BNP initially was 1120, it is now 912.  EKG revealing sinus rhythm rate of 77 beats per minute.  The patient has prolonged QT interval 0.422 milliseconds.  Chest x-ray atypical CHF with bilateral asymmetric lung densities rounded area in the right upper lung.  PHYSICAL EXAMINATION:  VITAL SIGNS:  Blood pressure 145/62, pulse 62, respirations 18, temperature 98.5, O2 sat 98% on 2 L, weight 75.1 kg (weight on discharge in January 2012, 74.6 kg). GENERAL:  She is awake, alert, oriented, hard of hearing with mild memory problems. HEENT:  Head is normocephalic and atraumatic.  Eyes, PERRLA. NECK:  Supple without JVD or carotid bruits appreciated. CARDIOVASCULAR:  Regular rate and rhythm with 1/6 systolic murmur. Pulses are 2+ and equal bilaterally. LUNGS:  Clear to auscultation with inspiratory wheezes and crackles in the left upper lobe. ABDOMEN:  Soft, nontender with 2+ bowel sounds. EXTREMITIES:  Without clubbing, cyanosis or edema. MUSCULOSKELETAL:  There is no joint deformity or effusion.  She does have a partially amputated right foot. NEURO:  She is very hard of hearing.  Otherwise, cranial nerves II through XII are grossly intact.  IMPRESSION: 1. Dyspnea, multifactorial.  She does not appear to be overtly fluid     overload.  Weight is essentially the same as weight in January 2012     at 94.6, lowest weight  was in January 2012 on admission at 73.3 kg     when she has had mild dehydration.  She is now on IV Lasix 20 mg.     We will change to p.o. and watch fluid status. 2. Chronic systolic cardiomyopathy with an ejection fraction of 50%     per echo in January 2012, but lower ejection fraction noted on     transesophageal echocardiography prior to [facility] cardioversion. 3.  Rule out pneumonia on no medication for antibiotics and treatment     at this time, present awaiting sputum and blood cultures. 4. Paroxysmal atrial fibrillation, status post pacemaker not on     amiodarone second to hypotension, continues on heparin, but not on     Coumadin.  We will not integrate pacemaker as he wishes to check 2     weeks ago.  PLAN:  This is a 77 year old Caucasian female with multiple admits in the past year with variable BNP levels, mild CAD, 90% small MI, normal LVEDP, chronically abnormal chest x-ray, mildly elevated troponin's and orthostatic hypotension.  We will restart standing diuretics at lower dose.  We will discontinue Exelon which maybe contributing to hypotension.  Replace potassium.  I would do a CT chest with contrast if D-dimer is elevated, otherwise without contrast.  We will follow making further recommendations throughout hospital course depending upon the patient's response to treatment.  On behalf of the physicians and providers of [facility], we would like to thank the [facility] for allowing [place] to participate in the care of this patient.     Bettey Mare. Lyman Bishop, NP   ______________________________ [name]. [name], MD, [facility]    KML/MEDQ  D:  10/15/2010  T:  10/16/2010  Job:  [id number]  Electronically Signed by Joni Reining NP on 10/18/2010 10:19:06 AM Electronically Signed by [place] Bing MD [facility] on 10/28/2010 11:15:21 AM

## 2010-10-30 NOTE — Medication Information (Signed)
Summary: Coumadin Clinic  Anticoagulant Therapy  Managed by: Inactive PCP: Loleta Rose Supervising MD: Andee Lineman MD, Michelle Piper Indication 1: Atrial Fibrillation Lab Used: [facility] [place] Site: [place]          Comments: Pt is off coumadin at present d/t falling, per Dr Myrtis Ser OV note in EMR 10/02/10. Cloyde Reams RN  October 22, 2010 4:19 PM   Allergies: 1)  ! Oxycodone Hcl 2)  Morphine Sulfate (Morphine Sulfate)  Anticoagulation Management History:      Anticoagulation is being administered due to atrial fibrillation requiring cardioversion.  Positive risk factors for bleeding include an age of 75 years or older, history of CVA/TIA, and presence of serious comorbidities.  Negative risk factors for bleeding include no history of GI bleeding.  The bleeding index is 'high risk'.  Positive CHADS2 values include History of HTN, Age > 30 years old, History of Diabetes, and Prior Stroke/CVA/TIA.  Negative CHADS2 values include History of CHF.  The start date was 04/29/2009.  Her last INR was 2.02.  Anticoagulation responsible provider: Andee Lineman MD, Michelle Piper.  Exp: 10/11.    Anticoagulation Management Assessment/Plan:      The target INR is 2.0-3.0.  The next INR is due 10/02/2010.  Anticoagulation instructions were given to patient.  Results were reviewed/authorized by Inactive.         Prior Anticoagulation Instructions: INR 2.3 Pt off coumadin since hospital d/c on 09/23/10   Discharge INR was 2.63 Pt and daughter assure me she has not received any coumadin  Is taking ASA 81mg   (2 tabs once daily ) Has appt with Dr Myrtis Ser 10/02/10   Will recheck INR then.

## 2010-10-30 NOTE — Letter (Signed)
Summary: [facility] Discharge Summary  [facility] Discharge Summary   Imported By: Earl Many 10/17/2010 15:28:27  _____________________________________________________________________  External Attachment:    Type:   Image     Comment:   External Document

## 2010-10-31 NOTE — H&P (Signed)
NAMEANNALIE, [name] NO.:  [id number]  MEDICAL RECORD NO.:  [id number]           PATIENT TYPE:  E  LOCATION:  APED                          FACILITY:  [facility]  PHYSICIAN:  Altha Harm, MDDATE OF BIRTH:  1931-09-28  DATE OF ADMISSION:  10/14/2010 DATE OF DISCHARGE:  LH                             HISTORY & PHYSICAL   CHIEF COMPLAINT:  Chills and fevers and pharyngitis x1 night, shortness of breath x3 days.  HISTORY OF PRESENT ILLNESS:  [name] is a 77 year old lady with a very complicated cardiac history, who presents to the Emergency Room with shortness of breath, fever, chills, and pharyngitis for 1-3 days. The patient's daughter who is a P.R.N. in the Emergency Room here at [facility] states that approximately 3 days ago, she noticed that she was having shortness of breath.  She is unable to tell me whether the patient had any orthopnea or PND, but the patient does admit to may be having some orthopnea although she is not a very reliable historian. The patient states, however, that last night she started having chills and fever and pain in her throat with swallowing.  Her daughter states that her temperature was 102.2 at home and it was not treated with any medications.  She spoke with her primary care physician, Dr. Sherwood Gambler, who advised that the patient should be brought to the Emergency Room for further evaluation and management.  The patient denies any nausea, vomiting, or diarrhea.  She denies any chest pain.  She denies any loss of consciousness or seizure activity, but she does have some degree of dizziness associated with her orthostatic hypotension.  The patient's daughter states that her blood pressure is usually around 140 and drops down to approximately 103 systolic been standing.  Of note is the fact that the patient had been on Lasix 40 mg p.o. b.i.d. prior to her last hospitalization, which was less than a month ago and during  that hospitalization, the Lasix was stopped due for the orthostatic hypotension.  The patient thus has been without any diuretics since approximately the middle of last month, which was about 20 days ago. The patient has not noted any change in her urination in terms of frequency or dysuria.  PAST MEDICAL HISTORY:  Significant for the following: 1. Diabetes type 2. 2. Atrial fib flutter. 3. Sick sinus syndrome, status post pacemaker placement (pacemaker     interrogated last month during last hospitalization). 4. Chronic systolic heart failure with normal EF approximately around     40%.  Please note that the ejection fraction is mediated by the     heart rate and decreases on the patient is in a tachyarrhythmia. 5. Hyperlipidemia. 6. Chronic kidney disease stage III (appears baseline creatinine     approximately 1.34). 7. Hypothyroidism. 8. Vertebrobasilar insufficiency. 9. Very mild dementia. 10.History of right carotid endarterectomy. 11.Peripheral arterial disease.  FAMILY HISTORY:  Significant for heart disease, diabetes, hypertension, cancer, and brain tumors.  SOCIAL HISTORY:  The patient lives with her daughter, Brittney [name], who is a Medicare at the Emergency Room.  Brittney [name] is her  healthcare power of attorney and can be reached at [phone].  The patient denies any tobacco, alcohol, or drug use.  ALLERGIES: 1. MORPHINE SULFATE. 2. OXYCODONE.  CURRENT MEDICATIONS:  Include the following: 1. Zoloft 100 mg p.o. daily. 2. Glipizide XL 5 mg p.o. b.i.d. 3. Vitamin C 1000 mg p.o. daily. 4. Vitamin B6 100 mg p.o. daily. 5. Lantus 100 units subcu b.i.d. 6. Synthroid 75 mcg p.o. daily. 7. Travatan 0.004% solution 1 drop to each eye daily. 8. Exelon 4.6 mg for 24 hours transdermally daily. 9. Aspirin 81 mg p.o. b.i.d. 10.Midodrine 5 mg p.o. twice a day at 8 a.m. and 3 p.m. 11.Tylenol Extra Strength 500 mg - 1000 mg p.o. q.i.d. p.r.n. fever     and pain.  CARDIOLOGIST:  Dr.  Myrtis Ser.  PRIMARY CARE PHYSICIAN:  Dr. Sherwood Gambler.  REVIEW OF SYSTEMS:  All other systems negative.  Studies in the emergency room show the following: Chest x-ray reveals findings most consistent with an atypical congestive heart failure.  It is similar to prior studies.  There is no definite pneumonia currently.  Hemogram shows a white blood cell count of 14.4, hemoglobin of 10.9, hematocrit of 32, platelet count of 242.  Sodium is 136, potassium 3.9, chloride 100, bicarb 26, BUN 16, creatinine 1.15. Please note, the patient's baseline creatinine on January 13 was 1.34. Urinalysis is negative for any elements consistent with an urinary tract infection.  Brain natriuretic peptide is elevated at 1120 and baseline BNP on September 20, 2010 is 158.  Lactic acid is 1.0.  Procalcitonin is less than 0.5.  Review of records of TEE on August 14, 2010 shows an EF of 20%-25%; however, a 2D echocardiogram shows an EF of 50%.  Please note that noted in the cardiology notes from the office is that the patient's EF is dependent upon her tachyarrhythmias state.  PHYSICAL EXAMINATION:  GENERAL:  The patient is lying in bed very comfortably in no acute distress.  Presently, she denies any chest pain or any real pain with swallowing. VITAL SIGNS:  Temperature recorded is 102.  Heart rate 69, blood pressure 160/55, respiratory rate is 22, O2 sats are 96% on 2 L per minute of oxygen. HEENT:  She is normocephalic, atraumatic.  Pupils are equally, round, and reactive to light and accommodation.  Extraocular movements are intact.  Oropharynx is moist.  No exudate, erythema, or lesions are noted. NECK:  Trachea is midline.  No masses.  No thyromegaly.  No JVD.  No carotid bruits. RESPIRATORY:  The patient has a normal respiratory effort.  Equal excursion bilaterally.  She has decreased air entry at bilateral bases; however, there is no rales, rhonchi, or wheezing noted.  There is no increased vocal fremitus and  there is no dullness to percussion. CARDIOVASCULAR:  She has got a normal S1 and S2.  I cannot appreciate any murmurs, rubs, or gallops.  The patient is in sinus paced rhythm on the telemetry. ABDOMEN:  Obese, soft, nontender, nondistended.  No masses.  No hepatosplenomegaly noted. LYMPH:  Node survey shows no cervical, axillary, or inguinal lymphadenopathy noted. PSYCHIATRIC:  The patient is alert and oriented x3.  She is hard-of- hearing, but can participate in the discussion of her medical care in a meaningful manner.  She does have some insight and cognition.  Some recent and remote recall difficulty. MUSCULOSKELETAL:  She has got no warmth, swelling, or erythema around the joints and there is no spinal tenderness noted. NEUROLOGICAL:  She has got no  focal neurological deficits.  Cranial nerves II through XII are grossly intact.  ASSESSMENT/PLAN:  This is a patient who presents with a febrile illness: I am unsure that this illness actually represents a pneumonia.  This may be a very likely be a viral illness.  The patient's clinical examination is not consistent with a pneumonia and her chest x-ray does not show any findings of the pneumonia within her fully hydrated state of our expectancy.  The patient has received vancomycin and Zosyn and then without a source, I will actually stop the vancomycin and Zosyn and observe her for any further fevers.  The patient shows no other sources and she is clinically stable at this time with no decrease in blood pressure.  I think if the patient's temperature goes up again, we will check another culture, but the patient does not show any real significant findings consistent with a pneumonia and I will hate to start this patient on antibiotics without a convincing source.  If, however, the patient deteriorates clinically, it would be prudent to put her back on broad-spectrum antibiotics until a source can be fully appreciated and identified.   In terms of a congestive heart failure, I think that this is largely due to the fact that the patient is off of any diuretics.  However given the patient's significant orthostasis, which cause of fall and fracture in the past hospitalizations, this becomes a very challenging issue.  I have spoken with Dr. Tenny Craw brought from [facility], who is rounding over here for Dr. Myrtis Ser.  I will start the patient on a low dose of IV Lasix 20 mg and we will track the patient's weight and urine output.  The patient will be replaced on the usual chronic medications including her glipizide for diabetes.  In terms of her diabetes, the patient will be put back on glipizide.  We will monitor her blood sugars and then make a decision about putting her back on the Lantus.  In the meantime, I will put her on a small dose of Lantus about 50% of what she normally uses at home.  We will check daily weights and Is and Os on this patient and we will check orthostatics on her also.  I would refrain upon this patient on any antihypertensives at this time or any nitro paste unless she is complaining of chest pain.  Please note that it has been noted in her prior records from the clinic that beta-blocker causes the patient to have a significant decrease in blood pressure without any real control of her heart rate.  The patient will be admitted to the telemetry unit and she will have serial enzymes to rule her out for ischemic event that may have led to her decompensated heart failure state.  At this time, the patient is in sinus rhythm.  I do not feel the need to interrogate her pacemaker, which was just interrogated just a month ago.  Due to the fact that the patient is a very high risk for fall and that we need to monitor her Is and Os closely while ensuring that her orthostasis does not cause her to fall, I will place a Foley in this patient because getting in and out of the bed at this point creates a  greater risk of injury as compared to the risk of urinary tract infection associated with a Foley use.  The patient will be maintained on oxygen at 2 L, which she uses at home.  Altha Harm, MD     MAM/MEDQ  D:  10/14/2010  T:  10/14/2010  Job:  [id number]  cc:   Madelin Rear. Sherwood Gambler, MD Fax: [phone]  [name], MD, [facility] [address]  [address] [place] [address]  Electronically Signed by Marthann Schiller MD on 10/31/2010 02:13:43 PM

## 2010-11-03 NOTE — Discharge Summary (Signed)
NAMEHASANA, ALCORTA NO.:  [id number]  MEDICAL RECORD NO.:  [id number]           PATIENT TYPE:  I  LOCATION:  A313                          FACILITY:  [facility]  PHYSICIAN:  Rishon Thilges L. Lendell Caprice, MDDATE OF BIRTH:  [birth year]  DATE OF ADMISSION:  10/14/2010 DATE OF DISCHARGE:  02/08/2012LH                              DISCHARGE SUMMARY   DISCHARGE DIAGNOSES: 1. Acute bronchitis. 2. Chronic systolic heart failure. 3. Orthostatic hypotension. 4. Brittle type 2 diabetes. 5. Sick sinus syndrome, status post pacemaker placement. 6. Hyperlipidemia. 7. Stage III chronic kidney disease. 8. Hypothyroidism. 9. Mild dementia. 10.History of right carotid endarterectomy. 11.Peripheral vascular disease. 12.Depression and anxiety. 13.Hypokalemia.  DISCHARGE MEDICATIONS: 1. Azithromycin 250 mg p.o. daily for 2 more days. 2. Lasix 40 mg a day. 3. She is to stop the Exelon patch, but may continue sertraline 100 mg     a day. 4. Levothyroxine 75 mg a day. 5. Lantus insulin 15 units subcutaneously twice a day. 6. Nitroglycerin sublingually 0.4 mg as directed p.r.n. chest pain. 7. Travatan ophthalmic solution 1 drop both eyes daily. 8. Glipizide XL 5 mg twice a day. 9. Tylenol 500 mg 1-2 p.o. q.6 h. p.r.n. pain. 10.Aspirin 81 mg 2 tablets daily. 11.Midodrine 5 mg p.o. b.i.d.  FOLLOWUP:  With Dr. Sherwood Gambler or Dr. Myrtis Ser in 1-2 weeks to monitor blood pressure and check a basic metabolic panel with attention to potassium and creatinine.  Followup with Dr. Fransico Him to assist with diabetes management.  CONDITION:  Stable.  DIET:  Heart-healthy low-sodium diabetic.  ACTIVITY:  Increase slowly.  CONSULTATIONS:  [name]. [name], MD, [facility]  PROCEDURES:  None.  LABORATORY DATA:  On admission; white blood cell count was 14,000, the following day was 9000, hemoglobin 10.9, hematocrit 32, normal platelet count and differential.  Complete metabolic panel significant for a glucose of  216, albumin of 3.  Initial BUN was 16 and creatinine 1.15, potassium dropped to a low of 3.1.  At discharge, her BUN was 24, creatinine 1.26.  BNP on admission was 1120.  Troponin's were flat at 0.15, 0.18 and 0.17.  Urinalysis showed 250 glucose, trace blood, 100 protein.  Group A strep screen negative.  Blood cultures negative to date.  Urine culture negative.  DIAGNOSTICS:  CT angiogram of the chest showed no pulmonary embolus, multiple bilateral ground-glass opacity and right upper lobe airspace disease may represent small airways/small vessel disease or pulmonary edema/pneumonia or hypersensitivity pneumonitis, stable ectasia/mild aneurysm of the ascending aorta measuring 4-cm, cardiomegaly and coronary artery disease.  Chest x-ray on admission showed atypical congestive heart failure, similar to prior studies.  No definite pneumonia.  EKG showed normal sinus rhythm and unchanged from previous.  HISTORY AND HOSPITAL COURSE:  Please see H and P for details.  Ms. Agostinelli is a medically complex 77 year old white female who presented to the emergency room with a temperature of 102.2.  She also had chills, fever, shortness of breath and pharyngitis.  She had a blood pressure initially of 160/55 and fever of 102.  She had clear lungs on admission. She was admitted for workup of her  fever and shortness of breath.  Her BNP was elevated and she has chronic heart failure.  An echocardiogram was repeated and showed an ejection fraction that had improved to 55- 60%.  She was started back on Lasix, but clinically appeared to be relatively compensated.  Cardiology was consulted and ordered a D-dimer. CAT scan was done and showed no pulmonary embolus.  She had problems with orthostatic hypotension which she has had in the past.  Her Exelon patch was stopped and her TED hose were continued.  Her orthostasis improved a bit.  She was started on azithromycin for her cough and her fevers.  She has  dementia and is a difficult historian, so a short course of antibiotic was started due to unreliable history.  The patient has been afebrile, has no shortness of breath.  Her sore throat as improved and she is medically stable for discharge.  Total time on the day of discharge is greater than 30 minutes.     Elon Eoff L. Lendell Caprice, MD     CLS/MEDQ  D:  10/16/2010  T:  10/17/2010  Job:  [id number]  Electronically Signed by Crista Curb MD on 11/03/2010 09:23:54 PM

## 2010-11-04 NOTE — H&P (Signed)
NAMERENEZMAE, CANLAS NO.:  [id number]  MEDICAL RECORD NO.:  [id number]           PATIENT TYPE:  E  LOCATION:  APED                          FACILITY:  [facility]  PHYSICIAN:  Eduard Clos, MDDATE OF BIRTH:  June 29, 1932  DATE OF ADMISSION:  10/20/2010 DATE OF DISCHARGE:  LH                             HISTORY & PHYSICAL   PRIMARY CARE PHYSICIAN:  Madelin Rear. Sherwood Gambler, MD  CHIEF COMPLAINT:  Chest pain.  HISTORY OF PRESENTING ILLNESS:  A 77 year old female with a known history of chronic systolic heart failure, last EF measured in October 15, 2010, was showing 55% with last TEE showing EF of 25-30%; history of sick sinus syndrome, status post St. Jude pacemaker; history of PAF, off Coumadin secondary to falls; history of recurrent pneumonias; TIA; diabetes mellitus type 2; chronic kidney disease stage II-IV; hypothyroidism; peripheral arterial disease, status post right radial artery stents to the right superficial artery and stents to popliteal arteries.  Had been recently admitted and discharged on October 16, 2010, that is 4 days ago after being treated for acute bronchitis.  The patient experienced some chest pain last evening, which was lasting only for 10 minutes and retrosternal, nonradiating.  Has had no nausea, vomiting, or abdominal pain today.  Denies any shortness of breath, dizziness, palpitation, loss of consciousness, any abdominal pain, dysuria, discharge, diarrhea, or dehydration.  The patient was found to have blood sugars more than 400.  The patient has not taken her Lantus as she has ran out of strips and they were not able to check the blood sugar.  Presently, the patient is chest pain free, will be admitted for chest pain, rule out and also control of blood sugar and also the patient is mildly dehydrated.  PAST MEDICAL HISTORY: 1. History of chronic systolic heart failure with last EF measured     last week was 55% and TEE done in December  2011, was showing EF of     25-30%. 2. History of proximal atrial fibrillation, not on Coumadin secondary     to falls. 3. History of [facility] cardioversion in December 2011. 4. History of sick sinus syndrome, status post pacemaker. 5. History of TIA; history of recurrent pneumonia. 6. History of diabetes mellitus type 2. 7. History of nonobstructive coronary disease per cardiac cath in     August 2011. 8. History of dyslipidemia. 9. History of chronic kidney disease. 10.Orthostatic hypotension. 11.History of anemia. 12.Peripheral arterial disease, status post stent to the right radial     artery, stent to the right superficial artery, and stent to the     popliteal arteries of the right. 13.History of hypothyroidism. 14.Hyperlipidemia.  PAST SURGICAL HISTORY:  Right-sided carotid endarterectomy, total abdominal hysterectomy, right mid foot amputation, and St. Jude pacemaker.  MEDICATIONS:  The patient was just recently discharged on includes Lasix 40 mg daily, Exelon patch was discontinued.  The patient on 2 days of azithromycin, levothyroxine 75 mcg daily, Lantus insulin 15 units subcutaneous twice daily which the patient has not taken today, nitroglycerin p.r.n., Travatan eyedrops, glipizide XR 5 mg twice daily, Tylenol 500 mg,  aspirin 160 mg daily, and midodrine 5 mg p.o. b.i.d.  ALLERGIES:  MORPHINE SULFATE and OXYCODONE.  FAMILY HISTORY:  Significant for heart disease, diabetes, hypertension, cancer, and brain tumor.  SOCIAL HISTORY:  The patient lives with her daughter.  Denies smoking cigarette, drinking alcohol, or using illegal drugs.  REVIEW OF SYSTEMS:  As per the history of present illness nothing else significant.  PHYSICAL EXAMINATION:  GENERAL:  The patient examined at bedside not in acute distress. VITAL SIGNS:  Blood pressure 150/60, pulse 74, temperature 98.2, respirations 18, and O2 sat 98%. HEENT:  Anicteric.  No pallor.  No discharge from the ears, eyes,  nose, or mouth. CHEST:  Bilateral air entry present.  No rhonchi.  No crepitation. HEART:  S1 and S2 heard. ABDOMEN:  Soft, nontender.  Bowel sounds heard. CNS:  The patient is alert, awake, oriented to time, place, and person, hard of hearing.  There is no facial asymmetry.  Tongue is midline. PERRLA positive.  Moves upper and lower extremities 5/5. EXTREMITIES:  There is amputation of the right foot transmetatarsal, otherwise there is no acute ischemic changes, clubbing, cyanosis, or edema.  LABORATORY DATA:  EKG shows paced rhythm.  Heart rate is around 60 beats per minute.  Chest x-ray shows no acute cardiopulmonary disease, stable cardiomegaly.  CBC, WBC is 9.1, hemoglobin is 12.9, hematocrit is 35.5, and platelets 355.  Basic metabolic panel:  Sodium 129, potassium 3.9, chloride 90, carbon dioxide 27, anion gap is 12, glucose 477, BUN 40, creatinine 1.5, and calcium 8.7.  Troponin I less than 0.05.  Myoglobin 76.6.  ASSESSMENT: 1. Chest pain, rule out ACA. 2. Mild renal failure probably from dehydration. 3. Hyperglycemia with uncontrolled diabetes secondary to     noncompliance. 4. Recent admission for bronchitis. 5. Chronic systolic heart failure with last ejection fraction measured     last week was 55%. 6. History of sick sinus syndrome, status post pacemaker placement. 7. History of paroxysmal atrial fibrillation, not on Coumadin     secondary to fall. 8. Hypothyroidism. 9. Mild dementia. 10.History of right-sided carotid endarterectomy for history of     peripheral arterial disease. 11.History of depression. 12.Anemia.  PLAN: 1. At this time, admit the patient to Telemetry. 2. For chest pain.  The patient's chest pain free at this time.  The     patient is on aspirin, nitroglycerin p.r.n., cyclic cardiac     markers.  The patient did have a cardiac cath in August 2011, which     was showing nonobstructive coronary disease. 3. For her dehydration.  At this time,  we will hold Lasix.  We will     gently hydrate the patient as the patient has history of chronic     systolic heart failure.  We will check orthostatics and follow her     creatinine, daily intake of food and daily weight. 4. Diabetes mellitus type 2, uncontrolled.  The patient did not take     her Lantus.  Today we are going to restart her Lantus 15 units     b.i.d.  Also, check hemoglobin A1c. 5. For anemia.  We will check anemia profile. 6. Further recommendation based on test ordered and clinic course. 7. The patient is a full code.     Eduard Clos, MD     ANK/MEDQ  D:  10/20/2010  T:  10/20/2010  Job:  [id number]  cc:   Madelin Rear. Sherwood Gambler, MD Fax: [phone]  Electronically Signed by Georgetta Haber  [facility] MD on 11/04/2010 04:52:42 PM

## 2010-11-12 ENCOUNTER — Encounter: Payer: Self-pay | Admitting: Cardiology

## 2010-11-12 ENCOUNTER — Ambulatory Visit (INDEPENDENT_AMBULATORY_CARE_PROVIDER_SITE_OTHER): Payer: MEDICARE | Admitting: Cardiology

## 2010-11-12 ENCOUNTER — Encounter (INDEPENDENT_AMBULATORY_CARE_PROVIDER_SITE_OTHER): Payer: MEDICARE

## 2010-11-12 DIAGNOSIS — I951 Orthostatic hypotension: Secondary | ICD-10-CM

## 2010-11-12 DIAGNOSIS — Z7901 Long term (current) use of anticoagulants: Secondary | ICD-10-CM

## 2010-11-12 DIAGNOSIS — I4891 Unspecified atrial fibrillation: Secondary | ICD-10-CM

## 2010-11-18 LAB — CULTURE, BLOOD (ROUTINE X 2)
Culture: NO GROWTH
Culture: NO GROWTH
Culture: NO GROWTH

## 2010-11-18 LAB — BASIC METABOLIC PANEL
BUN: 16 mg/dL (ref 6–23)
BUN: 35 mg/dL — ABNORMAL HIGH (ref 6–23)
BUN: 53 mg/dL — ABNORMAL HIGH (ref 6–23)
BUN: 30 mg/dL — ABNORMAL HIGH (ref 6–23)
BUN: 19 mg/dL (ref 6–23)
BUN: 22 mg/dL (ref 6–23)
BUN: 32 mg/dL — ABNORMAL HIGH (ref 6–23)
BUN: 34 mg/dL — ABNORMAL HIGH (ref 6–23)
CO2: 27 mEq/L (ref 19–32)
CO2: 32 mEq/L (ref 19–32)
CO2: 35 mEq/L — ABNORMAL HIGH (ref 19–32)
CO2: 34 mEq/L — ABNORMAL HIGH (ref 19–32)
CO2: 32 mEq/L (ref 19–32)
Calcium: 8.4 mg/dL (ref 8.4–10.5)
Calcium: 8.5 mg/dL (ref 8.4–10.5)
Calcium: 8.8 mg/dL (ref 8.4–10.5)
Calcium: 8.9 mg/dL (ref 8.4–10.5)
Calcium: 8.8 mg/dL (ref 8.4–10.5)
Chloride: 102 mEq/L (ref 96–112)
Chloride: 93 mEq/L — ABNORMAL LOW (ref 96–112)
Chloride: 98 mEq/L (ref 96–112)
Chloride: 96 mEq/L (ref 96–112)
Chloride: 96 mEq/L (ref 96–112)
Creatinine, Ser: 1.07 mg/dL (ref 0.4–1.2)
Creatinine, Ser: 1.09 mg/dL (ref 0.4–1.2)
Creatinine, Ser: 1.23 mg/dL — ABNORMAL HIGH (ref 0.4–1.2)
Creatinine, Ser: 1.84 mg/dL — ABNORMAL HIGH (ref 0.4–1.2)
Creatinine, Ser: 1.55 mg/dL — ABNORMAL HIGH (ref 0.4–1.2)
Creatinine, Ser: 1.24 mg/dL — ABNORMAL HIGH (ref 0.4–1.2)
Creatinine, Ser: 1.45 mg/dL — ABNORMAL HIGH (ref 0.4–1.2)
GFR calc Af Amer: 27 mL/min — ABNORMAL LOW (ref >60)
GFR calc Af Amer: 51 mL/min — ABNORMAL LOW (ref >60)
GFR calc Af Amer: >60 mL/min (ref >60)
GFR calc non Af Amer: 27 mL/min — ABNORMAL LOW (ref >60)
GFR calc non Af Amer: 42 mL/min — ABNORMAL LOW (ref >60)
GFR calc non Af Amer: 49 mL/min — ABNORMAL LOW (ref >60)
GFR calc non Af Amer: 50 mL/min — ABNORMAL LOW (ref >60)
GFR calc non Af Amer: 36 mL/min — ABNORMAL LOW (ref >60)
GFR calc non Af Amer: 42 mL/min — ABNORMAL LOW (ref >60)
GFR calc non Af Amer: 44 mL/min — ABNORMAL LOW (ref >60)
Glucose, Bld: 108 mg/dL — ABNORMAL HIGH (ref 70–99)
Glucose, Bld: 130 mg/dL — ABNORMAL HIGH (ref 70–99)
Glucose, Bld: 240 mg/dL — ABNORMAL HIGH (ref 70–99)
Glucose, Bld: 244 mg/dL — ABNORMAL HIGH (ref 70–99)
Glucose, Bld: 172 mg/dL — ABNORMAL HIGH (ref 70–99)
Glucose, Bld: 156 mg/dL — ABNORMAL HIGH (ref 70–99)
Glucose, Bld: 40 mg/dL — CL (ref 70–99)
Glucose, Bld: 45 mg/dL — ABNORMAL LOW (ref 70–99)
Potassium: 4.5 mEq/L (ref 3.5–5.1)
Potassium: 3.1 mEq/L — ABNORMAL LOW (ref 3.5–5.1)
Potassium: 4.1 mEq/L (ref 3.5–5.1)
Potassium: 4.2 mEq/L (ref 3.5–5.1)
Sodium: 136 mEq/L (ref 135–145)
Sodium: 142 mEq/L (ref 135–145)
Sodium: 139 mEq/L (ref 135–145)

## 2010-11-18 LAB — GLUCOSE, CAPILLARY
Glucose-Capillary: 109 mg/dL — ABNORMAL HIGH (ref 70–99)
Glucose-Capillary: 125 mg/dL — ABNORMAL HIGH (ref 70–99)
Glucose-Capillary: 135 mg/dL — ABNORMAL HIGH (ref 70–99)
Glucose-Capillary: 163 mg/dL — ABNORMAL HIGH (ref 70–99)
Glucose-Capillary: 186 mg/dL — ABNORMAL HIGH (ref 70–99)
Glucose-Capillary: 226 mg/dL — ABNORMAL HIGH (ref 70–99)
Glucose-Capillary: 243 mg/dL — ABNORMAL HIGH (ref 70–99)
Glucose-Capillary: 258 mg/dL — ABNORMAL HIGH (ref 70–99)
Glucose-Capillary: 269 mg/dL — ABNORMAL HIGH (ref 70–99)
Glucose-Capillary: 325 mg/dL — ABNORMAL HIGH (ref 70–99)
Glucose-Capillary: 40 mg/dL — CL (ref 70–99)
Glucose-Capillary: 409 mg/dL — ABNORMAL HIGH (ref 70–99)
Glucose-Capillary: 419 mg/dL — ABNORMAL HIGH (ref 70–99)
Glucose-Capillary: 46 mg/dL — ABNORMAL LOW (ref 70–99)
Glucose-Capillary: 93 mg/dL (ref 70–99)
Glucose-Capillary: 202 mg/dL — ABNORMAL HIGH (ref 70–99)
Glucose-Capillary: 54 mg/dL — ABNORMAL LOW (ref 70–99)
Glucose-Capillary: 108 mg/dL — ABNORMAL HIGH (ref 70–99)
Glucose-Capillary: 111 mg/dL — ABNORMAL HIGH (ref 70–99)
Glucose-Capillary: 142 mg/dL — ABNORMAL HIGH (ref 70–99)
Glucose-Capillary: 149 mg/dL — ABNORMAL HIGH (ref 70–99)
Glucose-Capillary: 161 mg/dL — ABNORMAL HIGH (ref 70–99)
Glucose-Capillary: 169 mg/dL — ABNORMAL HIGH (ref 70–99)
Glucose-Capillary: 195 mg/dL — ABNORMAL HIGH (ref 70–99)
Glucose-Capillary: 196 mg/dL — ABNORMAL HIGH (ref 70–99)
Glucose-Capillary: 238 mg/dL — ABNORMAL HIGH (ref 70–99)
Glucose-Capillary: 239 mg/dL — ABNORMAL HIGH (ref 70–99)
Glucose-Capillary: 247 mg/dL — ABNORMAL HIGH (ref 70–99)
Glucose-Capillary: 255 mg/dL — ABNORMAL HIGH (ref 70–99)
Glucose-Capillary: 269 mg/dL — ABNORMAL HIGH (ref 70–99)
Glucose-Capillary: 357 mg/dL — ABNORMAL HIGH (ref 70–99)
Glucose-Capillary: 369 mg/dL — ABNORMAL HIGH (ref 70–99)
Glucose-Capillary: 39 mg/dL — CL (ref 70–99)
Glucose-Capillary: 50 mg/dL — ABNORMAL LOW (ref 70–99)
Glucose-Capillary: 53 mg/dL — ABNORMAL LOW (ref 70–99)

## 2010-11-18 LAB — DIFFERENTIAL
Basophils Absolute: 0 10*3/uL (ref 0.0–0.1)
Basophils Absolute: 0 10*3/uL (ref 0.0–0.1)
Basophils Absolute: 0.1 10*3/uL (ref 0.0–0.1)
Basophils Absolute: 0.1 10*3/uL (ref 0.0–0.1)
Basophils Absolute: 0 10*3/uL (ref 0.0–0.1)
Basophils Absolute: 0 10*3/uL (ref 0.0–0.1)
Basophils Absolute: 0 10*3/uL (ref 0.0–0.1)
Basophils Absolute: 0 K/uL (ref 0.0–0.1)
Basophils Absolute: 0 K/uL (ref 0.0–0.1)
Basophils Absolute: 0.1 10*3/uL (ref 0.0–0.1)
Basophils Relative: 0 % (ref 0–1)
Basophils Relative: 0 % (ref 0–1)
Basophils Relative: 0 % (ref 0–1)
Basophils Relative: 0 % (ref 0–1)
Basophils Relative: 0 % (ref 0–1)
Basophils Relative: 0 % (ref 0–1)
Basophils Relative: 0 % (ref 0–1)
Basophils Relative: 0 % (ref 0–1)
Basophils Relative: 0 % (ref 0–1)
Eosinophils Absolute: 0.5 10*3/uL (ref 0.0–0.7)
Eosinophils Absolute: 0.7 10*3/uL (ref 0.0–0.7)
Eosinophils Absolute: 0.9 10*3/uL — ABNORMAL HIGH (ref 0.0–0.7)
Eosinophils Absolute: 0.1 10*3/uL (ref 0.0–0.7)
Eosinophils Absolute: 0.4 K/uL (ref 0.0–0.7)
Eosinophils Absolute: 0.4 K/uL (ref 0.0–0.7)
Eosinophils Absolute: 0.6 10*3/uL (ref 0.0–0.7)
Eosinophils Absolute: 0.7 10*3/uL (ref 0.0–0.7)
Eosinophils Relative: 7 % — ABNORMAL HIGH (ref 0–5)
Eosinophils Relative: 8 % — ABNORMAL HIGH (ref 0–5)
Eosinophils Relative: 2 % (ref 0–5)
Eosinophils Relative: 4 % (ref 0–5)
Eosinophils Relative: 5 % (ref 0–5)
Eosinophils Relative: 6 % — ABNORMAL HIGH (ref 0–5)
Lymphocytes Relative: 26 % (ref 12–46)
Lymphocytes Relative: 39 % (ref 12–46)
Lymphocytes Relative: 35 % (ref 12–46)
Lymphocytes Relative: 41 % (ref 12–46)
Lymphs Abs: 2.7 10*3/uL (ref 0.7–4.0)
Lymphs Abs: 7.1 10*3/uL — ABNORMAL HIGH (ref 0.7–4.0)
Lymphs Abs: 3.9 K/uL (ref 0.7–4.0)
Lymphs Abs: 5.4 K/uL — ABNORMAL HIGH (ref 0.7–4.0)
Monocytes Absolute: 0.9 10*3/uL (ref 0.1–1.0)
Monocytes Absolute: 1.7 10*3/uL — ABNORMAL HIGH (ref 0.1–1.0)
Monocytes Absolute: 0.7 K/uL (ref 0.1–1.0)
Monocytes Absolute: 1 10*3/uL (ref 0.1–1.0)
Monocytes Absolute: 1.1 10*3/uL — ABNORMAL HIGH (ref 0.1–1.0)
Monocytes Absolute: 1.1 10*3/uL — ABNORMAL HIGH (ref 0.1–1.0)
Monocytes Absolute: 1.3 K/uL — ABNORMAL HIGH (ref 0.1–1.0)
Monocytes Relative: 10 % (ref 3–12)
Monocytes Relative: 7 % (ref 3–12)
Monocytes Relative: 8 % (ref 3–12)
Monocytes Relative: 8 % (ref 3–12)
Monocytes Relative: 8 % (ref 3–12)
Neutro Abs: 5.8 10*3/uL (ref 1.7–7.7)
Neutro Abs: 6.7 10*3/uL (ref 1.7–7.7)
Neutro Abs: 7 10*3/uL (ref 1.7–7.7)
Neutro Abs: 8.5 10*3/uL — ABNORMAL HIGH (ref 1.7–7.7)
Neutro Abs: 4.5 K/uL (ref 1.7–7.7)
Neutro Abs: 5.1 10*3/uL (ref 1.7–7.7)
Neutro Abs: 7.1 10*3/uL (ref 1.7–7.7)
Neutro Abs: 8.2 K/uL — ABNORMAL HIGH (ref 1.7–7.7)
Neutro Abs: 9.3 10*3/uL — ABNORMAL HIGH (ref 1.7–7.7)
Neutrophils Relative %: 55 % (ref 43–77)
Neutrophils Relative %: 56 % (ref 43–77)
Neutrophils Relative %: 56 % (ref 43–77)
Neutrophils Relative %: 48 % (ref 43–77)
Neutrophils Relative %: 54 % (ref 43–77)
Neutrophils Relative %: 64 % (ref 43–77)

## 2010-11-18 LAB — CBC
HCT: 32 % — ABNORMAL LOW (ref 36.0–46.0)
HCT: 31.3 % — ABNORMAL LOW (ref 36.0–46.0)
HCT: 31.4 % — ABNORMAL LOW (ref 36.0–46.0)
HCT: 32.2 % — ABNORMAL LOW (ref 36.0–46.0)
HCT: 32.8 % — ABNORMAL LOW (ref 36.0–46.0)
HCT: 33.6 % — ABNORMAL LOW (ref 36.0–46.0)
HCT: 35.2 % — ABNORMAL LOW (ref 36.0–46.0)
HCT: 35.9 % — ABNORMAL LOW (ref 36.0–46.0)
Hemoglobin: 9.4 g/dL — ABNORMAL LOW (ref 12.0–15.0)
Hemoglobin: 10.1 g/dL — ABNORMAL LOW (ref 12.0–15.0)
Hemoglobin: 10.8 g/dL — ABNORMAL LOW (ref 12.0–15.0)
Hemoglobin: 11 g/dL — ABNORMAL LOW (ref 12.0–15.0)
Hemoglobin: 11.9 g/dL — ABNORMAL LOW (ref 12.0–15.0)
MCH: 28.3 pg (ref 26.0–34.0)
MCH: 28.3 pg (ref 26.0–34.0)
MCH: 28.5 pg (ref 26.0–34.0)
MCH: 29.2 pg (ref 26.0–34.0)
MCH: 28.8 pg (ref 26.0–34.0)
MCH: 28.9 pg (ref 26.0–34.0)
MCH: 29.4 pg (ref 26.0–34.0)
MCH: 29.4 pg (ref 26.0–34.0)
MCH: 29.5 pg (ref 26.0–34.0)
MCHC: 32.2 g/dL (ref 30.0–36.0)
MCHC: 32.3 g/dL (ref 30.0–36.0)
MCHC: 32.4 g/dL (ref 30.0–36.0)
MCHC: 33.6 g/dL (ref 30.0–36.0)
MCHC: 31.3 g/dL (ref 30.0–36.0)
MCHC: 32.2 g/dL (ref 30.0–36.0)
MCHC: 32.7 g/dL (ref 30.0–36.0)
MCHC: 32.9 g/dL (ref 30.0–36.0)
MCHC: 32.9 g/dL (ref 30.0–36.0)
MCV: 87 fL (ref 78.0–100.0)
MCV: 87.8 fL (ref 78.0–100.0)
MCV: 87.9 fL (ref 78.0–100.0)
MCV: 92.3 fL (ref 78.0–100.0)
MCV: 88.2 fL (ref 78.0–100.0)
MCV: 88.9 fL (ref 78.0–100.0)
MCV: 89.4 fL (ref 78.0–100.0)
MCV: 89.7 fL (ref 78.0–100.0)
MCV: 90.1 fL (ref 78.0–100.0)
Platelets: 227 10*3/uL (ref 150–400)
Platelets: 236 10*3/uL (ref 150–400)
Platelets: 360 10*3/uL (ref 150–400)
Platelets: 312 10*3/uL (ref 150–400)
Platelets: 264 K/uL (ref 150–400)
Platelets: 268 10*3/uL (ref 150–400)
Platelets: 281 K/uL (ref 150–400)
Platelets: 295 K/uL (ref 150–400)
RBC: 3.31 MIL/uL — ABNORMAL LOW (ref 3.87–5.11)
RBC: 3.32 MIL/uL — ABNORMAL LOW (ref 3.87–5.11)
RBC: 3.5 MIL/uL — ABNORMAL LOW (ref 3.87–5.11)
RBC: 3.55 MIL/uL — ABNORMAL LOW (ref 3.87–5.11)
RBC: 3.67 MIL/uL — ABNORMAL LOW (ref 3.87–5.11)
RBC: 3.73 MIL/uL — ABNORMAL LOW (ref 3.87–5.11)
RDW: 13.6 % (ref 11.5–15.5)
RDW: 13.8 % (ref 11.5–15.5)
RDW: 13.8 % (ref 11.5–15.5)
RDW: 14.1 % (ref 11.5–15.5)
RDW: 14 % (ref 11.5–15.5)
RDW: 14 % (ref 11.5–15.5)
RDW: 14.1 % (ref 11.5–15.5)
RDW: 14.1 % (ref 11.5–15.5)
RDW: 14.1 % (ref 11.5–15.5)
RDW: 14.2 % (ref 11.5–15.5)
WBC: 10.4 10*3/uL (ref 4.0–10.5)
WBC: 11 K/uL — ABNORMAL HIGH (ref 4.0–10.5)
WBC: 12.7 K/uL — ABNORMAL HIGH (ref 4.0–10.5)
WBC: 13.4 10*3/uL — ABNORMAL HIGH (ref 4.0–10.5)
WBC: 14.6 10*3/uL — ABNORMAL HIGH (ref 4.0–10.5)
WBC: 15.2 10*3/uL — ABNORMAL HIGH (ref 4.0–10.5)
WBC: 9.5 K/uL (ref 4.0–10.5)

## 2010-11-18 LAB — BASIC METABOLIC PANEL WITH GFR
BUN: 35 mg/dL — ABNORMAL HIGH (ref 6–23)
CO2: 31 meq/L (ref 19–32)
Calcium: 9.1 mg/dL (ref 8.4–10.5)
Chloride: 101 meq/L (ref 96–112)
Creatinine, Ser: 1.5 mg/dL — ABNORMAL HIGH (ref 0.4–1.2)
GFR calc non Af Amer: 34 mL/min — ABNORMAL LOW
Glucose, Bld: 56 mg/dL — ABNORMAL LOW (ref 70–99)
Potassium: 4.6 meq/L (ref 3.5–5.1)
Sodium: 140 meq/L (ref 135–145)

## 2010-11-18 LAB — CARDIAC PANEL(CRET KIN+CKTOT+MB+TROPI)
CK, MB: 1.1 ng/mL (ref 0.3–4.0)
CK, MB: 1.1 ng/mL (ref 0.3–4.0)
CK, MB: 1.8 ng/mL (ref 0.3–4.0)
Total CK: 26 U/L (ref 7–177)
Total CK: 29 U/L (ref 7–177)
Troponin I: 0.03 ng/mL (ref 0.00–0.06)
Troponin I: 0.03 ng/mL (ref 0.00–0.06)
Troponin I: 0.04 ng/mL (ref 0.00–0.06)
Troponin I: 0.03 ng/mL (ref 0.00–0.06)

## 2010-11-18 LAB — COMPREHENSIVE METABOLIC PANEL
ALT: 23 U/L (ref 0–35)
Albumin: 2.8 g/dL — ABNORMAL LOW (ref 3.5–5.2)
Albumin: 3 g/dL — ABNORMAL LOW (ref 3.5–5.2)
Alkaline Phosphatase: 81 U/L (ref 39–117)
Alkaline Phosphatase: 97 U/L (ref 39–117)
BUN: 23 mg/dL (ref 6–23)
BUN: 20 mg/dL (ref 6–23)
Calcium: 9.2 mg/dL (ref 8.4–10.5)
Chloride: 94 mEq/L — ABNORMAL LOW (ref 96–112)
Chloride: 99 mEq/L (ref 96–112)
Creatinine, Ser: 1.43 mg/dL — ABNORMAL HIGH (ref 0.4–1.2)
GFR calc Af Amer: 43 mL/min — ABNORMAL LOW (ref >60)
Glucose, Bld: 239 mg/dL — ABNORMAL HIGH (ref 70–99)
Glucose, Bld: 298 mg/dL — ABNORMAL HIGH (ref 70–99)
Potassium: 3.6 mEq/L (ref 3.5–5.1)
Potassium: 3.9 mEq/L (ref 3.5–5.1)
Sodium: 138 mEq/L (ref 135–145)
Total Bilirubin: 0.7 mg/dL (ref 0.3–1.2)
Total Bilirubin: 0.5 mg/dL (ref 0.3–1.2)
Total Protein: 7.3 g/dL (ref 6.0–8.3)
Total Protein: 6.5 g/dL (ref 6.0–8.3)
Total Protein: 7.4 g/dL (ref 6.0–8.3)

## 2010-11-18 LAB — PROTIME-INR
INR: 1.96 — ABNORMAL HIGH (ref 0.00–1.49)
INR: 2.49 — ABNORMAL HIGH (ref 0.00–1.49)
INR: 3.28 — ABNORMAL HIGH (ref 0.00–1.49)
INR: 3.6 — ABNORMAL HIGH (ref 0.00–1.49)
INR: 1.98 — ABNORMAL HIGH (ref 0.00–1.49)
INR: 2.4 — ABNORMAL HIGH (ref 0.00–1.49)
INR: 3.19 — ABNORMAL HIGH (ref 0.00–1.49)
INR: 3.23 — ABNORMAL HIGH (ref 0.00–1.49)
INR: 3.45 — ABNORMAL HIGH (ref 0.00–1.49)
INR: 3.92 — ABNORMAL HIGH (ref 0.00–1.49)
Prothrombin Time: 33.4 seconds — ABNORMAL HIGH (ref 11.6–15.2)
Prothrombin Time: 35.9 seconds — ABNORMAL HIGH (ref 11.6–15.2)
Prothrombin Time: 22.7 s — ABNORMAL HIGH (ref 11.6–15.2)
Prothrombin Time: 26.3 s — ABNORMAL HIGH (ref 11.6–15.2)
Prothrombin Time: 32.7 seconds — ABNORMAL HIGH (ref 11.6–15.2)
Prothrombin Time: 34.7 s — ABNORMAL HIGH (ref 11.6–15.2)
Prothrombin Time: 38.3 s — ABNORMAL HIGH (ref 11.6–15.2)

## 2010-11-18 LAB — URINALYSIS, ROUTINE W REFLEX MICROSCOPIC
Bilirubin Urine: NEGATIVE
Bilirubin Urine: NEGATIVE
Glucose, UA: NEGATIVE mg/dL
Hgb urine dipstick: NEGATIVE
Ketones, ur: NEGATIVE mg/dL
Ketones, ur: NEGATIVE mg/dL
Ketones, ur: NEGATIVE mg/dL
Leukocytes, UA: NEGATIVE
Nitrite: NEGATIVE
Nitrite: NEGATIVE
Protein, ur: 30 mg/dL — AB
Protein, ur: NEGATIVE mg/dL
Protein, ur: NEGATIVE mg/dL
Specific Gravity, Urine: 1.015 (ref 1.005–1.030)
Urobilinogen, UA: 0.2 mg/dL (ref 0.0–1.0)
Urobilinogen, UA: 0.2 mg/dL (ref 0.0–1.0)
pH: 5 (ref 5.0–8.0)

## 2010-11-18 LAB — POCT CARDIAC MARKERS
CKMB, poc: 1.4 ng/mL (ref 1.0–8.0)
Myoglobin, poc: 96.7 ng/mL (ref 12–200)
Troponin i, poc: <0.05 ng/mL (ref 0.00–0.09)
Troponin i, poc: <0.05 ng/mL (ref 0.00–0.09)

## 2010-11-18 LAB — URINE CULTURE
Colony Count: NO GROWTH
Colony Count: NO GROWTH
Culture  Setup Time: 201112252003
Culture  Setup Time: 201112070148
Culture: NO GROWTH
Special Requests: NEGATIVE

## 2010-11-18 LAB — APTT: aPTT: 48 seconds — ABNORMAL HIGH (ref 24–37)

## 2010-11-18 LAB — LIPID PANEL
Cholesterol: 163 mg/dL (ref 0–200)
HDL: 35 mg/dL — ABNORMAL LOW (ref >39)
LDL Cholesterol: 116 mg/dL — ABNORMAL HIGH (ref 0–99)
Total CHOL/HDL Ratio: 4.7 RATIO
Triglycerides: 60 mg/dL (ref <150)
VLDL: 12 mg/dL (ref 0–40)

## 2010-11-18 LAB — BRAIN NATRIURETIC PEPTIDE
Pro B Natriuretic peptide (BNP): 504 pg/mL — ABNORMAL HIGH (ref 0.0–100.0)
Pro B Natriuretic peptide (BNP): 900 pg/mL — ABNORMAL HIGH (ref 0.0–100.0)
Pro B Natriuretic peptide (BNP): 333 pg/mL — ABNORMAL HIGH (ref 0.0–100.0)
Pro B Natriuretic peptide (BNP): 432 pg/mL — ABNORMAL HIGH (ref 0.0–100.0)

## 2010-11-18 LAB — MAGNESIUM
Magnesium: 1.6 mg/dL (ref 1.5–2.5)
Magnesium: 1.7 mg/dL (ref 1.5–2.5)
Magnesium: 1.6 mg/dL (ref 1.5–2.5)
Magnesium: 1.9 mg/dL (ref 1.5–2.5)

## 2010-11-18 LAB — FOLATE: Folate: 9.3 ng/mL

## 2010-11-18 LAB — BLOOD GAS, ARTERIAL
Bicarbonate: 30.1 mEq/L — ABNORMAL HIGH (ref 20.0–24.0)
O2 Saturation: 89.3 %
Patient temperature: 37
TCO2: 26.7 mmol/L (ref 0–100)
pO2, Arterial: 53.6 mmHg — ABNORMAL LOW (ref 80.0–100.0)

## 2010-11-18 LAB — HEMOCCULT GUIAC POC 1CARD (OFFICE): Fecal Occult Bld: NEGATIVE

## 2010-11-18 LAB — AMIODARONE LEVEL
Amiodarone Lvl: <0.3 ug/mL — ABNORMAL LOW (ref 1.5–2.5)
N-Desethyl-Amiodarone: <0.3 ug/mL — ABNORMAL LOW (ref 1.5–2.5)

## 2010-11-18 LAB — OCCULT BLOOD (STOOL CUP TO LAB): Fecal Occult Bld: NEGATIVE

## 2010-11-18 LAB — IRON AND TIBC
Iron: 13 ug/dL — ABNORMAL LOW (ref 42–135)
Saturation Ratios: 4 % — ABNORMAL LOW (ref 20–55)
TIBC: 315 ug/dL (ref 250–470)
UIBC: 302 ug/dL

## 2010-11-18 LAB — MRSA PCR SCREENING: MRSA by PCR: POSITIVE — AB

## 2010-11-18 LAB — FERRITIN: Ferritin: 178 ng/mL (ref 10–291)

## 2010-11-18 LAB — RETICULOCYTES
RBC.: 3.61 MIL/uL — ABNORMAL LOW (ref 3.87–5.11)
Retic Count, Absolute: 43.3 K/uL (ref 19.0–186.0)
Retic Ct Pct: 1.2 % (ref 0.4–3.1)

## 2010-11-18 LAB — HEMOGLOBIN A1C: Hgb A1c MFr Bld: 10.4 % — ABNORMAL HIGH (ref <5.7)

## 2010-11-18 LAB — VITAMIN B12: Vitamin B-12: 327 pg/mL (ref 211–911)

## 2010-11-18 LAB — TSH: TSH: 2.485 u[IU]/mL (ref 0.350–4.500)

## 2010-11-19 NOTE — Assessment & Plan Note (Signed)
Summary: 2 MO F/U FROM 1/25 OV-JM   Visit Type:  Follow-up Primary Provider:  Loleta Rose  CC:  orthostatic hypotension and atrial fibrillation.  History of Present Illness: The patient is seen for cardiology followup.  I saw her last October 02, 2010.  She was stable at that time.  Since then she has been admitted to the hospital on October 14, 2010.  I have reviewed all of these records and discuss the issue with the patient and her daughter.  She felt poorly but there was no definite diagnosis.  She did not have syncope.  She was stabilized and allowed to go home.  Since being at home she's done well.  I have reviewed a long list of home blood pressures and weights.  Her weight is stable.  Her blood pressure in the last few days has been significantly low in the morning before her Midrin dose.  She feels mildly dizzy but has not had any true syncopal spells.  She does continue to wear thigh high support hose.  Current Medications (verified): 1)  Nitroglycerin 0.4 Mg Subl (Nitroglycerin) .... Place 1 Tablet Under Tongue As Directed 2)  Sertraline Hcl 100 Mg Tabs (Sertraline Hcl) .... Take 1 Tablet By Mouth Once A Day 3)  Glipizide Xl 5 Mg Xr24h-Tab (Glipizide) .[name].. 1 By Mouth Daily 4)  Vitamin C 1000 Mg Tabs (Ascorbic Acid) .... Take 1 Tablet By Mouth Once A Day 5)  Vitamin B-6 100 Mg Tabs (Pyridoxine Hcl) .... Take 1 Tablet By Mouth Once A Day 6)  Lantus 100 Unit/ml Soln (Insulin Glargine) .Marland KitchenMarland KitchenMarland Kitchen 15u Subcutaneously Two Times A Day 7)  Levothyroxine Sodium 75 Mcg Tabs (Levothyroxine Sodium) .... Take 1 Tablet By Mouth Once A Day 8)  Travatan Z 0.004 % Soln (Travoprost) .... Both Eyes Once Daily 9)  Aspir-Low 81 Mg Tbec (Aspirin) .... Take 2 Tablet By Mouth Once A Day 10)  Midodrine Hcl 5 Mg Tabs (Midodrine Hcl) .... Take 1 Tablet By Mouth Two Times A Day 11)  Tylenol Extra Strength 500 Mg Tabs (Acetaminophen) .... Take 1-2 Tablet By Mouth Four Times A Day As Needed 12)  Furosemide 40 Mg  Tabs (Furosemide) .[name].. 1 By Mouth Two Times A Day  Allergies (verified): 1)  ! Oxycodone Hcl 2)  Morphine Sulfate (Morphine Sulfate)  Past History:  Past Medical History: HYPERTENSION. dyslipidemia CAD   cath...04/2009 40% left main,30% LAD/50-60% OM 2,40% right coronary artery , 90% OM1.was culprit lesion non-STEMI.[name] too small for intervention.. EF  55-60%  echo..01/15/2010 / EF 30%.. TEE. tachycardia cardiomyopathy /   EF 50% echo..09/21/2010 ATRIAL FIBRILLATION... 04/2009.. rapid rate..Coumadin started TE cardioversion done 8/23, 2010... atrial fib.[name] amiodarone..Beta blockers.[name] during hospitalization August, 2010.  Beta blockers lower blood pressure excessively without heart rate control / [place] atrial flutter with difficult rate control... TEE cardioversion done....ejection fraction 30%.. secondary to tachycardia.. patient did convert back to sinus rhythm. / return of atrial fibrillation /     outpatient..cardioversion May 31, 2009. [facility]... severe bradycardia.. chest compressions..pacemaker placed  /  aimodarone used until stopped  01/2010  /  return of atrial flutter October, 2011... amiodarone restarted 06/2010 / cardioversion 08/15/2010 /  amio stopped 09/2010 orthostasis/syncopehe Pacemaker.... St. Jude Accent.DR RF.[name] June 01, 2009 Diabetes Dementia ??? early ?? GERD H/O TIAs Hypothyroidism carotid endarterectomy.Marland KitchenRIGHT PAD  and Stent t right peroneal,  right superficial , right popliteal arteries..Dr. Edilia Bo right mid foot amputation  2010 CKD ..creatinine  1.37   01/2010,  1.38 hospital d/c 09/22/2010 Anemia CKD Gait disorder Vertebrobasilar insufficiency.. Depression Coumadin therapy.. started August, 2010 / stopped 09/2010 orthostasis/syncope / consider restart  Hyperkalemia... ACE inhibitor held Acute renal insufficiency.. catheterization.. 8, 2010 Gastroparesis, abnormal GES 3/11 blindess..R eye Respiratory  failure-hypoxic & hypercapnic    01/2010..( etiology  pneumonia) Leukocytosis  persistent post pneumonia..01/2010 Orthostasis.  falling / syncope  09/2010 ... facial trauma ventricular tachycardia 20 beats 08/2011 asymptomatic  Review of Systems       Patient denies fever, chills, headache, sweats, rash, change in vision, change in hearing, chest pain, cough, nausea vomiting, urinary symptoms.  All other systems are reviewed and are negative  Vital Signs:  Patient profile:   77 year old female Height:      67 inches Weight:      164 pounds BMI:     25.78 Pulse rate:   76 / minute Resp:     18 per minute BP sitting:   148 / 72  (right arm)  Vitals Entered By: Marrion Coy, CNA (November 12, 2010 2:49 PM)  Physical Exam  General:  The patient looks quite good today.  She is here with 2 daughters. Head:  head is atraumatic. Eyes:  no xanthelasma. Neck:  no jugular venous distention. Chest Wall:  no chest wall tenderness. Lungs:  lungs are clear.  Respiratory effort is nonlabored. Heart:  cardiac exam reveals the rhythm is regular.  There no significant murmurs. Abdomen:  abdomen is soft. Msk:  no musculoskeletal deformities. Extremities:  no peripheral edema. Skin:  no skin rashes. Psych:  patient is oriented to person time and place.  Affect is normal.   PPM Specifications Following MD:  Hillis Range, MD     PPM Vendor:  St Jude     PPM Model Number:  [id number]     PPM Serial Number:  [id number] PPM DOI:  06/01/2009     PPM Implanting MD:  Hillis Range, MD  Lead 1    Location: [facility]     DOI: 06/01/2009     Model #: [id number]     Serial #: [id number]     Status: active Lead 2    Location: RV     DOI: 06/01/2009     Model #: [id number]     Serial #: [id number]     Status: active  Magnet Response Rate:  BOL 100 ERI 85    PPM Follow Up Pacer Dependent:  No      Episodes Coumadin:  Yes  Parameters Mode:  DDDR     Lower Rate Limit:  60     Upper Rate Limit:  120 Paced AV Delay:  180     Sensed  AV Delay:  160  Impression & Recommendations:  Problem # 1:  ATRIAL FIBRILLATION (ICD-427.31) Patient rhythm is regular.  She has been taken off her amiodarone and she continues to have a regular rhythm.  I am hopeful that the rhythm remained regular.  Problem # 2:  * TACHYCARDIA INDUCED CARDIOMYOPATHY When checked last her ejection fraction had returned to 50%.  We think that her cardiomyopathy may have been tachycardia related.  She is doing well.  Problem # 3:  ORTHOSTATIC HYPOTENSION, HX OF (ICD-V12.50) The patient is tolerating midodrine.  Her daughter has been checking her blood pressure first thing in the morning before the Midrin.  I've asked her to change and check after she has gotten a dose of medicine.  Also because her pressure has been lower  recently, I am decreasing her Lasix to 40 mg once a day instead of twice.  She will continue to wear her support hose.  I've given her permission to try knee-high support hose if her pressure remains stable with her daughter check  Problem # 4:  * FLUID OVERLOAD  volume status is stable. I decided to cut her Lasix dose down to 40 mg daily.  Problem # 5:  * AMIODARONE THERAPY Amiodarone was stopped and then restarted.  It was stopped last January, 2012 with orthostasis.  I hopeful that she'll hold sinus rhythm.  Problem # 6:  COUMADIN THERAPY (ICD-V58.61) Patient Coumadin was stopped January, 2012 with orthostasis and syncope.  I will still consider restarting it but not yet  Problem # 7:  ALZHEIMER'S DISEASE (ICD-331.0) There is a component of Alzheimer's according to the daughter.  I have given her permission to restart her medication for this but only if she does not have return of orthostasis.  Patient Instructions: 1)  Follow up with Dr. Myrtis Ser on  [facility] Jan 16, 2011 AT 1:15 PM. 2)  Decrease Lasix (furosemide) to once daily. 3)  Take midodrine first thing in morning before anything else. Do not take BP until after taking this med.

## 2010-11-19 NOTE — Medication Information (Signed)
Summary: Coumadin Clinic  Anticoagulant Therapy  Managed by: Inactive PCP: Loleta Rose Supervising MD: Andee Lineman MD, Michelle Piper Indication 1: Atrial Fibrillation Lab Used: [facility] [place] Site: [place] INR POC 1.0  Dietary changes: no    Health status changes: no    Bleeding/hemorrhagic complications: no    Recent/future hospitalizations: no    Any changes in medication regimen? no    Recent/future dental: no  Any missed doses?: no       Is patient compliant with meds? yes      Comments: Pt is not taking coumadin.  Wanted INR rechecked since it was still elevated at post hospital visit and coumadin had been d/c'd in the hospital.  INR 1.0 today.  Allergies: 1)  ! Oxycodone Hcl 2)  Morphine Sulfate (Morphine Sulfate)  Anticoagulation Management History:      The patient is taking warfarin and comes in today for a routine follow up visit.  Anticoagulation is being administered due to atrial fibrillation requiring cardioversion.  Positive risk factors for bleeding include an age of 75 years or older, history of CVA/TIA, and presence of serious comorbidities.  Negative risk factors for bleeding include no history of GI bleeding.  The bleeding index is 'high risk'.  Positive CHADS2 values include History of HTN, Age > 60 years old, History of Diabetes, and Prior Stroke/CVA/TIA.  Negative CHADS2 values include History of CHF.  The start date was 04/29/2009.  Her last INR was 2.02.  Anticoagulation responsible provider: Andee Lineman MD, Michelle Piper.  INR POC: 1.0.  Exp: 10/11.    Anticoagulation Management Assessment/Plan:      The target INR is 2.0-3.0.  The next INR is due 10/02/2010.  Anticoagulation instructions were given to patient.  Results were reviewed/authorized by Inactive.         Prior Anticoagulation Instructions: INR 2.3 Pt off coumadin since hospital d/c on 09/23/10   Discharge INR was 2.63 Pt and daughter assure me she has not received any coumadin  Is taking ASA  81mg   (2 tabs once daily ) Has appt with Dr Myrtis Ser 10/02/10   Will recheck INR then.  Current Anticoagulation Instructions: INR 1.0 Off coumadin

## 2010-11-25 LAB — URINALYSIS, ROUTINE W REFLEX MICROSCOPIC
Bilirubin Urine: NEGATIVE
Hgb urine dipstick: NEGATIVE
Nitrite: NEGATIVE
Protein, ur: NEGATIVE mg/dL
Specific Gravity, Urine: 1.015 (ref 1.005–1.030)
Urobilinogen, UA: 0.2 mg/dL (ref 0.0–1.0)

## 2010-11-25 LAB — FERRITIN: Ferritin: 451 ng/mL — ABNORMAL HIGH (ref 10–291)

## 2010-11-25 LAB — IRON AND TIBC
Iron: 23 ug/dL — ABNORMAL LOW (ref 42–135)
Saturation Ratios: 10 % — ABNORMAL LOW (ref 20–55)
TIBC: 231 ug/dL — ABNORMAL LOW (ref 250–470)
UIBC: 208 ug/dL

## 2010-11-25 LAB — CBC
HCT: 25.6 % — ABNORMAL LOW (ref 36.0–46.0)
HCT: 26.9 % — ABNORMAL LOW (ref 36.0–46.0)
HCT: 27.8 % — ABNORMAL LOW (ref 36.0–46.0)
HCT: 27.3 % — ABNORMAL LOW (ref 36.0–46.0)
HCT: 27.7 % — ABNORMAL LOW (ref 36.0–46.0)
HCT: 28.5 % — ABNORMAL LOW (ref 36.0–46.0)
HCT: 28.7 % — ABNORMAL LOW (ref 36.0–46.0)
Hemoglobin: 8.9 g/dL — ABNORMAL LOW (ref 12.0–15.0)
Hemoglobin: 9 g/dL — ABNORMAL LOW (ref 12.0–15.0)
Hemoglobin: 9.5 g/dL — ABNORMAL LOW (ref 12.0–15.0)
Hemoglobin: 9.7 g/dL — ABNORMAL LOW (ref 12.0–15.0)
MCHC: 33.5 g/dL (ref 30.0–36.0)
MCHC: 34.8 g/dL (ref 30.0–36.0)
MCHC: 33.4 g/dL (ref 30.0–36.0)
MCHC: 33.9 g/dL (ref 30.0–36.0)
MCHC: 34 g/dL (ref 30.0–36.0)
MCHC: 34.1 g/dL (ref 30.0–36.0)
MCHC: 35.2 g/dL (ref 30.0–36.0)
MCV: 92.6 fL (ref 78.0–100.0)
MCV: 93.1 fL (ref 78.0–100.0)
MCV: 91.4 fL (ref 78.0–100.0)
MCV: 92 fL (ref 78.0–100.0)
Platelets: 263 10*3/uL (ref 150–400)
Platelets: 297 10*3/uL (ref 150–400)
Platelets: 244 10*3/uL (ref 150–400)
Platelets: 317 10*3/uL (ref 150–400)
Platelets: 346 10*3/uL (ref 150–400)
Platelets: 383 10*3/uL (ref 150–400)
Platelets: 395 10*3/uL (ref 150–400)
Platelets: 554 10*3/uL — ABNORMAL HIGH (ref 150–400)
Platelets: 591 10*3/uL — ABNORMAL HIGH (ref 150–400)
RBC: 2.75 MIL/uL — ABNORMAL LOW (ref 3.87–5.11)
RBC: 2.9 MIL/uL — ABNORMAL LOW (ref 3.87–5.11)
RBC: 3.08 MIL/uL — ABNORMAL LOW (ref 3.87–5.11)
RBC: 3.14 MIL/uL — ABNORMAL LOW (ref 3.87–5.11)
RBC: 3.18 MIL/uL — ABNORMAL LOW (ref 3.87–5.11)
RDW: 13.9 % (ref 11.5–15.5)
RDW: 13.7 % (ref 11.5–15.5)
RDW: 13.7 % (ref 11.5–15.5)
RDW: 13.9 % (ref 11.5–15.5)
RDW: 13.9 % (ref 11.5–15.5)
WBC: 9.2 10*3/uL (ref 4.0–10.5)
WBC: 9.5 10*3/uL (ref 4.0–10.5)
WBC: 12.6 10*3/uL — ABNORMAL HIGH (ref 4.0–10.5)
WBC: 12.7 10*3/uL — ABNORMAL HIGH (ref 4.0–10.5)
WBC: 16.4 10*3/uL — ABNORMAL HIGH (ref 4.0–10.5)
WBC: 16.5 10*3/uL — ABNORMAL HIGH (ref 4.0–10.5)

## 2010-11-25 LAB — CK TOTAL AND CKMB (NOT AT ARMC)
CK, MB: 1.9 ng/mL (ref 0.3–4.0)
Relative Index: INVALID (ref 0.0–2.5)
Total CK: 41 U/L (ref 7–177)

## 2010-11-25 LAB — DIFFERENTIAL
Basophils Absolute: 0.1 10*3/uL (ref 0.0–0.1)
Basophils Absolute: 0 10*3/uL (ref 0.0–0.1)
Basophils Absolute: 0 10*3/uL (ref 0.0–0.1)
Basophils Absolute: 0 10*3/uL (ref 0.0–0.1)
Basophils Absolute: 0.1 10*3/uL (ref 0.0–0.1)
Basophils Absolute: 0.1 10*3/uL (ref 0.0–0.1)
Basophils Relative: 1 % (ref 0–1)
Basophils Relative: 1 % (ref 0–1)
Basophils Relative: 0 % (ref 0–1)
Basophils Relative: 0 % (ref 0–1)
Basophils Relative: 0 % (ref 0–1)
Basophils Relative: 0 % (ref 0–1)
Basophils Relative: 1 % (ref 0–1)
Basophils Relative: 1 % (ref 0–1)
Eosinophils Absolute: 0.7 10*3/uL (ref 0.0–0.7)
Eosinophils Absolute: 1 10*3/uL — ABNORMAL HIGH (ref 0.0–0.7)
Eosinophils Absolute: 0.5 10*3/uL (ref 0.0–0.7)
Eosinophils Absolute: 0.6 10*3/uL (ref 0.0–0.7)
Eosinophils Absolute: 0.6 10*3/uL (ref 0.0–0.7)
Eosinophils Relative: 11 % — ABNORMAL HIGH (ref 0–5)
Eosinophils Relative: 11 % — ABNORMAL HIGH (ref 0–5)
Eosinophils Relative: 7 % — ABNORMAL HIGH (ref 0–5)
Eosinophils Relative: 3 % (ref 0–5)
Eosinophils Relative: 3 % (ref 0–5)
Eosinophils Relative: 5 % (ref 0–5)
Eosinophils Relative: 5 % (ref 0–5)
Lymphocytes Relative: 37 % (ref 12–46)
Lymphocytes Relative: 40 % (ref 12–46)
Lymphocytes Relative: 16 % (ref 12–46)
Lymphocytes Relative: 19 % (ref 12–46)
Lymphocytes Relative: 25 % (ref 12–46)
Lymphocytes Relative: 34 % (ref 12–46)
Lymphocytes Relative: 35 % (ref 12–46)
Lymphs Abs: 3.5 10*3/uL (ref 0.7–4.0)
Lymphs Abs: 3.7 10*3/uL (ref 0.7–4.0)
Lymphs Abs: 2 10*3/uL (ref 0.7–4.0)
Lymphs Abs: 4.1 10*3/uL — ABNORMAL HIGH (ref 0.7–4.0)
Monocytes Absolute: 0.8 10*3/uL (ref 0.1–1.0)
Monocytes Absolute: 0.9 10*3/uL (ref 0.1–1.0)
Monocytes Absolute: 0.6 10*3/uL (ref 0.1–1.0)
Monocytes Absolute: 0.8 10*3/uL (ref 0.1–1.0)
Monocytes Absolute: 0.9 10*3/uL (ref 0.1–1.0)
Monocytes Absolute: 1 10*3/uL (ref 0.1–1.0)
Monocytes Relative: 8 % (ref 3–12)
Monocytes Relative: 4 % (ref 3–12)
Monocytes Relative: 5 % (ref 3–12)
Monocytes Relative: 7 % (ref 3–12)
Neutro Abs: 4.4 10*3/uL (ref 1.7–7.7)
Neutro Abs: 10.3 10*3/uL — ABNORMAL HIGH (ref 1.7–7.7)
Neutro Abs: 10.6 10*3/uL — ABNORMAL HIGH (ref 1.7–7.7)
Neutro Abs: 12 10*3/uL — ABNORMAL HIGH (ref 1.7–7.7)
Neutro Abs: 5.8 10*3/uL (ref 1.7–7.7)
Neutro Abs: 7.5 10*3/uL (ref 1.7–7.7)
Neutro Abs: 9.3 10*3/uL — ABNORMAL HIGH (ref 1.7–7.7)
Neutrophils Relative %: 38 % — ABNORMAL LOW (ref 43–77)
Neutrophils Relative %: 47 % (ref 43–77)
Neutrophils Relative %: 53 % (ref 43–77)
Neutrophils Relative %: 55 % (ref 43–77)
Neutrophils Relative %: 63 % (ref 43–77)
Neutrophils Relative %: 73 % (ref 43–77)
Neutrophils Relative %: 73 % (ref 43–77)

## 2010-11-25 LAB — CARDIAC PANEL(CRET KIN+CKTOT+MB+TROPI)
CK, MB: 1.9 ng/mL (ref 0.3–4.0)
Relative Index: INVALID (ref 0.0–2.5)
Relative Index: INVALID (ref 0.0–2.5)
Total CK: 38 U/L (ref 7–177)
Troponin I: 0.04 ng/mL (ref 0.00–0.06)
Troponin I: 0.06 ng/mL (ref 0.00–0.06)

## 2010-11-25 LAB — RETICULOCYTES
RBC.: 2.83 MIL/uL — ABNORMAL LOW (ref 3.87–5.11)
Retic Count, Absolute: 73.6 10*3/uL (ref 19.0–186.0)
Retic Ct Pct: 2.6 % (ref 0.4–3.1)

## 2010-11-25 LAB — GLUCOSE, CAPILLARY
Glucose-Capillary: 108 mg/dL — ABNORMAL HIGH (ref 70–99)
Glucose-Capillary: 174 mg/dL — ABNORMAL HIGH (ref 70–99)
Glucose-Capillary: 203 mg/dL — ABNORMAL HIGH (ref 70–99)
Glucose-Capillary: 203 mg/dL — ABNORMAL HIGH (ref 70–99)
Glucose-Capillary: 281 mg/dL — ABNORMAL HIGH (ref 70–99)
Glucose-Capillary: 328 mg/dL — ABNORMAL HIGH (ref 70–99)
Glucose-Capillary: 40 mg/dL — CL (ref 70–99)
Glucose-Capillary: 53 mg/dL — ABNORMAL LOW (ref 70–99)
Glucose-Capillary: 92 mg/dL (ref 70–99)
Glucose-Capillary: 165 mg/dL — ABNORMAL HIGH (ref 70–99)
Glucose-Capillary: 169 mg/dL — ABNORMAL HIGH (ref 70–99)
Glucose-Capillary: 169 mg/dL — ABNORMAL HIGH (ref 70–99)
Glucose-Capillary: 209 mg/dL — ABNORMAL HIGH (ref 70–99)
Glucose-Capillary: 216 mg/dL — ABNORMAL HIGH (ref 70–99)
Glucose-Capillary: 226 mg/dL — ABNORMAL HIGH (ref 70–99)
Glucose-Capillary: 249 mg/dL — ABNORMAL HIGH (ref 70–99)
Glucose-Capillary: 250 mg/dL — ABNORMAL HIGH (ref 70–99)
Glucose-Capillary: 255 mg/dL — ABNORMAL HIGH (ref 70–99)
Glucose-Capillary: 255 mg/dL — ABNORMAL HIGH (ref 70–99)
Glucose-Capillary: 276 mg/dL — ABNORMAL HIGH (ref 70–99)
Glucose-Capillary: 276 mg/dL — ABNORMAL HIGH (ref 70–99)
Glucose-Capillary: 286 mg/dL — ABNORMAL HIGH (ref 70–99)
Glucose-Capillary: 334 mg/dL — ABNORMAL HIGH (ref 70–99)
Glucose-Capillary: 340 mg/dL — ABNORMAL HIGH (ref 70–99)
Glucose-Capillary: 352 mg/dL — ABNORMAL HIGH (ref 70–99)
Glucose-Capillary: 394 mg/dL — ABNORMAL HIGH (ref 70–99)
Glucose-Capillary: 403 mg/dL — ABNORMAL HIGH (ref 70–99)
Glucose-Capillary: 410 mg/dL — ABNORMAL HIGH (ref 70–99)
Glucose-Capillary: 484 mg/dL — ABNORMAL HIGH (ref 70–99)
Glucose-Capillary: 87 mg/dL (ref 70–99)

## 2010-11-25 LAB — BASIC METABOLIC PANEL
BUN: 19 mg/dL (ref 6–23)
BUN: 19 mg/dL (ref 6–23)
BUN: 20 mg/dL (ref 6–23)
BUN: 25 mg/dL — ABNORMAL HIGH (ref 6–23)
BUN: 26 mg/dL — ABNORMAL HIGH (ref 6–23)
BUN: 28 mg/dL — ABNORMAL HIGH (ref 6–23)
BUN: 29 mg/dL — ABNORMAL HIGH (ref 6–23)
CO2: 33 mEq/L — ABNORMAL HIGH (ref 19–32)
CO2: 34 mEq/L — ABNORMAL HIGH (ref 19–32)
CO2: 35 mEq/L — ABNORMAL HIGH (ref 19–32)
CO2: 40 mEq/L — ABNORMAL HIGH (ref 19–32)
CO2: 42 mEq/L (ref 19–32)
Calcium: 8.7 mg/dL (ref 8.4–10.5)
Calcium: 8.9 mg/dL (ref 8.4–10.5)
Calcium: 8.3 mg/dL — ABNORMAL LOW (ref 8.4–10.5)
Calcium: 8.4 mg/dL (ref 8.4–10.5)
Calcium: 8.5 mg/dL (ref 8.4–10.5)
Calcium: 8.5 mg/dL (ref 8.4–10.5)
Chloride: 97 mEq/L (ref 96–112)
Chloride: 98 mEq/L (ref 96–112)
Chloride: 89 mEq/L — ABNORMAL LOW (ref 96–112)
Chloride: 90 mEq/L — ABNORMAL LOW (ref 96–112)
Creatinine, Ser: 1.25 mg/dL — ABNORMAL HIGH (ref 0.4–1.2)
Creatinine, Ser: 1.3 mg/dL — ABNORMAL HIGH (ref 0.4–1.2)
Creatinine, Ser: 1.4 mg/dL — ABNORMAL HIGH (ref 0.4–1.2)
Creatinine, Ser: 1.38 mg/dL — ABNORMAL HIGH (ref 0.4–1.2)
Creatinine, Ser: 1.4 mg/dL — ABNORMAL HIGH (ref 0.4–1.2)
GFR calc Af Amer: 48 mL/min — ABNORMAL LOW (ref >60)
GFR calc Af Amer: 50 mL/min — ABNORMAL LOW (ref >60)
GFR calc Af Amer: 44 mL/min — ABNORMAL LOW (ref >60)
GFR calc Af Amer: 45 mL/min — ABNORMAL LOW (ref >60)
GFR calc Af Amer: >60 mL/min (ref >60)
GFR calc non Af Amer: 39 mL/min — ABNORMAL LOW (ref >60)
GFR calc non Af Amer: 40 mL/min — ABNORMAL LOW (ref >60)
GFR calc non Af Amer: 37 mL/min — ABNORMAL LOW (ref >60)
GFR calc non Af Amer: 37 mL/min — ABNORMAL LOW (ref >60)
GFR calc non Af Amer: 42 mL/min — ABNORMAL LOW (ref >60)
GFR calc non Af Amer: 50 mL/min — ABNORMAL LOW (ref >60)
Glucose, Bld: 109 mg/dL — ABNORMAL HIGH (ref 70–99)
Glucose, Bld: 122 mg/dL — ABNORMAL HIGH (ref 70–99)
Glucose, Bld: 195 mg/dL — ABNORMAL HIGH (ref 70–99)
Glucose, Bld: 227 mg/dL — ABNORMAL HIGH (ref 70–99)
Glucose, Bld: 95 mg/dL (ref 70–99)
Potassium: 3.4 mEq/L — ABNORMAL LOW (ref 3.5–5.1)
Potassium: 4.2 mEq/L (ref 3.5–5.1)
Potassium: 4.3 mEq/L (ref 3.5–5.1)
Potassium: 3.7 mEq/L (ref 3.5–5.1)
Potassium: 3.8 mEq/L (ref 3.5–5.1)
Potassium: 3.9 mEq/L (ref 3.5–5.1)
Potassium: 4.3 mEq/L (ref 3.5–5.1)
Sodium: 138 mEq/L (ref 135–145)
Sodium: 141 mEq/L (ref 135–145)
Sodium: 135 mEq/L (ref 135–145)
Sodium: 135 mEq/L (ref 135–145)
Sodium: 138 mEq/L (ref 135–145)

## 2010-11-25 LAB — VANCOMYCIN, RANDOM: Vancomycin Rm: 10.6 ug/mL

## 2010-11-25 LAB — BLOOD GAS, ARTERIAL
Acid-Base Excess: 13.5 mmol/L — ABNORMAL HIGH (ref 0.0–2.0)
Acid-Base Excess: 14.6 mmol/L — ABNORMAL HIGH (ref 0.0–2.0)
Bicarbonate: 30.1 mEq/L — ABNORMAL HIGH (ref 20.0–24.0)
Bicarbonate: 38 mEq/L — ABNORMAL HIGH (ref 20.0–24.0)
Bicarbonate: 39.7 mEq/L — ABNORMAL HIGH (ref 20.0–24.0)
FIO2: 0.6 %
FIO2: 60 %
O2 Saturation: 93.3 %
O2 Saturation: 87.4 %
O2 Saturation: 95.1 %
O2 Saturation: 95.6 %
Patient temperature: 37
Patient temperature: 37
Patient temperature: 98.6
TCO2: 27.7 mmol/L (ref 0–100)
TCO2: 36.8 mmol/L (ref 0–100)
pH, Arterial: 7.413 — ABNORMAL HIGH (ref 7.350–7.400)
pO2, Arterial: 64.8 mmHg — ABNORMAL LOW (ref 80.0–100.0)
pO2, Arterial: 48.6 mmHg — ABNORMAL LOW (ref 80.0–100.0)
pO2, Arterial: 71.1 mmHg — ABNORMAL LOW (ref 80.0–100.0)

## 2010-11-25 LAB — PROTIME-INR
INR: 2.61 — ABNORMAL HIGH (ref 0.00–1.49)
INR: 3.67 — ABNORMAL HIGH (ref 0.00–1.49)
INR: 3.73 — ABNORMAL HIGH (ref 0.00–1.49)
INR: 2.18 — ABNORMAL HIGH (ref 0.00–1.49)
INR: 2.28 — ABNORMAL HIGH (ref 0.00–1.49)
INR: 2.32 — ABNORMAL HIGH (ref 0.00–1.49)
INR: 2.84 — ABNORMAL HIGH (ref 0.00–1.49)
INR: 3.31 — ABNORMAL HIGH (ref 0.00–1.49)
INR: 3.78 — ABNORMAL HIGH (ref 0.00–1.49)
INR: 4.08 — ABNORMAL HIGH (ref 0.00–1.49)
Prothrombin Time: 27.7 seconds — ABNORMAL HIGH (ref 11.6–15.2)
Prothrombin Time: 36.6 seconds — ABNORMAL HIGH (ref 11.6–15.2)
Prothrombin Time: 24.1 seconds — ABNORMAL HIGH (ref 11.6–15.2)
Prothrombin Time: 29.6 seconds — ABNORMAL HIGH (ref 11.6–15.2)
Prothrombin Time: 39.3 seconds — ABNORMAL HIGH (ref 11.6–15.2)

## 2010-11-25 LAB — URINALYSIS, MICROSCOPIC ONLY
Bilirubin Urine: NEGATIVE
Glucose, UA: 100 mg/dL — AB
Hgb urine dipstick: NEGATIVE
Ketones, ur: NEGATIVE mg/dL
Protein, ur: NEGATIVE mg/dL

## 2010-11-25 LAB — HEMOCCULT GUIAC POC 1CARD (OFFICE)
Fecal Occult Bld: NEGATIVE
Fecal Occult Bld: NEGATIVE
Fecal Occult Bld: NEGATIVE

## 2010-11-25 LAB — URINE CULTURE
Colony Count: 7000
Special Requests: NEGATIVE

## 2010-11-25 LAB — COMPREHENSIVE METABOLIC PANEL
AST: 26 U/L (ref 0–37)
Albumin: 1.9 g/dL — ABNORMAL LOW (ref 3.5–5.2)
Albumin: 2.6 g/dL — ABNORMAL LOW (ref 3.5–5.2)
Alkaline Phosphatase: 67 U/L (ref 39–117)
BUN: 17 mg/dL (ref 6–23)
BUN: 27 mg/dL — ABNORMAL HIGH (ref 6–23)
CO2: 38 mEq/L — ABNORMAL HIGH (ref 19–32)
Chloride: 89 mEq/L — ABNORMAL LOW (ref 96–112)
Creatinine, Ser: 0.99 mg/dL (ref 0.4–1.2)
Glucose, Bld: 175 mg/dL — ABNORMAL HIGH (ref 70–99)
Potassium: 4.7 mEq/L (ref 3.5–5.1)
Total Bilirubin: 0.6 mg/dL (ref 0.3–1.2)
Total Protein: 6.4 g/dL (ref 6.0–8.3)

## 2010-11-25 LAB — D-DIMER, QUANTITATIVE: D-Dimer, Quant: 0.54 ug/mL-FEU — ABNORMAL HIGH (ref 0.00–0.48)

## 2010-11-25 LAB — FOLATE: Folate: 7.8 ng/mL

## 2010-11-25 LAB — GLUCOSE, RANDOM
Glucose, Bld: 336 mg/dL — ABNORMAL HIGH (ref 70–99)
Glucose, Bld: 440 mg/dL — ABNORMAL HIGH (ref 70–99)
Glucose, Bld: 494 mg/dL — ABNORMAL HIGH (ref 70–99)
Glucose, Bld: 584 mg/dL (ref 70–99)

## 2010-11-25 LAB — BRAIN NATRIURETIC PEPTIDE
Pro B Natriuretic peptide (BNP): 105 pg/mL — ABNORMAL HIGH (ref 0.0–100.0)
Pro B Natriuretic peptide (BNP): 78.2 pg/mL (ref 0.0–100.0)
Pro B Natriuretic peptide (BNP): 170 pg/mL — ABNORMAL HIGH (ref 0.0–100.0)
Pro B Natriuretic peptide (BNP): 379 pg/mL — ABNORMAL HIGH (ref 0.0–100.0)
Pro B Natriuretic peptide (BNP): 421 pg/mL — ABNORMAL HIGH (ref 0.0–100.0)

## 2010-11-25 LAB — VANCOMYCIN, TROUGH: Vancomycin Tr: 25.7 ug/mL (ref 10.0–20.0)

## 2010-11-25 LAB — TROPONIN I: Troponin I: 0.08 ng/mL — ABNORMAL HIGH (ref 0.00–0.06)

## 2010-11-25 LAB — VITAMIN B12: Vitamin B-12: 578 pg/mL (ref 211–911)

## 2010-11-25 LAB — PROCALCITONIN: Procalcitonin: <0.5 ng/mL

## 2010-11-26 LAB — GLUCOSE, CAPILLARY
Glucose-Capillary: 122 mg/dL — ABNORMAL HIGH (ref 70–99)
Glucose-Capillary: 157 mg/dL — ABNORMAL HIGH (ref 70–99)
Glucose-Capillary: 158 mg/dL — ABNORMAL HIGH (ref 70–99)
Glucose-Capillary: 163 mg/dL — ABNORMAL HIGH (ref 70–99)
Glucose-Capillary: 186 mg/dL — ABNORMAL HIGH (ref 70–99)
Glucose-Capillary: 188 mg/dL — ABNORMAL HIGH (ref 70–99)
Glucose-Capillary: 194 mg/dL — ABNORMAL HIGH (ref 70–99)
Glucose-Capillary: 195 mg/dL — ABNORMAL HIGH (ref 70–99)
Glucose-Capillary: 216 mg/dL — ABNORMAL HIGH (ref 70–99)
Glucose-Capillary: 288 mg/dL — ABNORMAL HIGH (ref 70–99)
Glucose-Capillary: 359 mg/dL — ABNORMAL HIGH (ref 70–99)
Glucose-Capillary: 73 mg/dL (ref 70–99)

## 2010-11-26 LAB — DIFFERENTIAL
Basophils Absolute: 0 10*3/uL (ref 0.0–0.1)
Basophils Absolute: 0 10*3/uL (ref 0.0–0.1)
Basophils Absolute: 0 10*3/uL (ref 0.0–0.1)
Basophils Absolute: 0 10*3/uL (ref 0.0–0.1)
Basophils Relative: 0 % (ref 0–1)
Basophils Relative: 0 % (ref 0–1)
Basophils Relative: 0 % (ref 0–1)
Basophils Relative: 0 % (ref 0–1)
Basophils Relative: 0 % (ref 0–1)
Eosinophils Absolute: 0 10*3/uL (ref 0.0–0.7)
Eosinophils Absolute: 0.3 10*3/uL (ref 0.0–0.7)
Eosinophils Relative: 0 % (ref 0–5)
Eosinophils Relative: 0 % (ref 0–5)
Eosinophils Relative: 0 % (ref 0–5)
Eosinophils Relative: 2 % (ref 0–5)
Eosinophils Relative: 3 % (ref 0–5)
Lymphocytes Relative: 14 % (ref 12–46)
Lymphocytes Relative: 19 % (ref 12–46)
Lymphocytes Relative: 20 % (ref 12–46)
Lymphs Abs: 2.3 10*3/uL (ref 0.7–4.0)
Lymphs Abs: 2.3 10*3/uL (ref 0.7–4.0)
Lymphs Abs: 2.5 10*3/uL (ref 0.7–4.0)
Monocytes Absolute: 0.6 10*3/uL (ref 0.1–1.0)
Monocytes Absolute: 0.8 10*3/uL (ref 0.1–1.0)
Monocytes Absolute: 1 10*3/uL (ref 0.1–1.0)
Monocytes Relative: 5 % (ref 3–12)
Neutro Abs: 8.5 10*3/uL — ABNORMAL HIGH (ref 1.7–7.7)
Neutro Abs: 8.6 10*3/uL — ABNORMAL HIGH (ref 1.7–7.7)
Neutrophils Relative %: 73 % (ref 43–77)
Neutrophils Relative %: 79 % — ABNORMAL HIGH (ref 43–77)

## 2010-11-26 LAB — BLOOD GAS, ARTERIAL
Acid-Base Excess: 7.5 mmol/L — ABNORMAL HIGH (ref 0.0–2.0)
Acid-Base Excess: 9.2 mmol/L — ABNORMAL HIGH (ref 0.0–2.0)
Bicarbonate: 27.4 mEq/L — ABNORMAL HIGH (ref 20.0–24.0)
Bicarbonate: 31 mEq/L — ABNORMAL HIGH (ref 20.0–24.0)
Bicarbonate: 34.1 mEq/L — ABNORMAL HIGH (ref 20.0–24.0)
Bicarbonate: 34.7 mEq/L — ABNORMAL HIGH (ref 20.0–24.0)
Delivery systems: POSITIVE
Delivery systems: POSITIVE
Expiratory PAP: 6
Expiratory PAP: 6
FIO2: 40 %
FIO2: 75 %
Inspiratory PAP: 12
O2 Content: 50 L/min
O2 Content: 60 L/min
O2 Content: 60 L/min
O2 Saturation: 91.6 %
O2 Saturation: 92.9 %
O2 Saturation: 95.6 %
O2 Saturation: 96 %
Patient temperature: 37
Patient temperature: 37
Patient temperature: 37
TCO2: 24.9 mmol/L (ref 0–100)
TCO2: 28.1 mmol/L (ref 0–100)
TCO2: 31.6 mmol/L (ref 0–100)
TCO2: 31.7 mmol/L (ref 0–100)
pCO2 arterial: 44 mmHg (ref 35.0–45.0)
pCO2 arterial: 55.8 mmHg — ABNORMAL HIGH (ref 35.0–45.0)
pH, Arterial: 7.403 — ABNORMAL HIGH (ref 7.350–7.400)
pH, Arterial: 7.411 — ABNORMAL HIGH (ref 7.350–7.400)
pH, Arterial: 7.418 — ABNORMAL HIGH (ref 7.350–7.400)
pO2, Arterial: 53.4 mmHg — ABNORMAL LOW (ref 80.0–100.0)
pO2, Arterial: 58.1 mmHg — ABNORMAL LOW (ref 80.0–100.0)
pO2, Arterial: 96.7 mmHg (ref 80.0–100.0)

## 2010-11-26 LAB — CBC
HCT: 27.9 % — ABNORMAL LOW (ref 36.0–46.0)
HCT: 28.3 % — ABNORMAL LOW (ref 36.0–46.0)
HCT: 28.5 % — ABNORMAL LOW (ref 36.0–46.0)
HCT: 31.5 % — ABNORMAL LOW (ref 36.0–46.0)
HCT: 34 % — ABNORMAL LOW (ref 36.0–46.0)
Hemoglobin: 11.9 g/dL — ABNORMAL LOW (ref 12.0–15.0)
Hemoglobin: 9.9 g/dL — ABNORMAL LOW (ref 12.0–15.0)
MCHC: 34 g/dL (ref 30.0–36.0)
MCHC: 34.7 g/dL (ref 30.0–36.0)
MCHC: 35 g/dL (ref 30.0–36.0)
MCV: 91.7 fL (ref 78.0–100.0)
MCV: 93.1 fL (ref 78.0–100.0)
Platelets: 147 10*3/uL — ABNORMAL LOW (ref 150–400)
Platelets: 154 10*3/uL (ref 150–400)
Platelets: 173 10*3/uL (ref 150–400)
Platelets: 202 10*3/uL (ref 150–400)
RDW: 13.6 % (ref 11.5–15.5)
RDW: 13.7 % (ref 11.5–15.5)
RDW: 13.9 % (ref 11.5–15.5)
WBC: 10.8 10*3/uL — ABNORMAL HIGH (ref 4.0–10.5)
WBC: 11.7 10*3/uL — ABNORMAL HIGH (ref 4.0–10.5)
WBC: 11.8 10*3/uL — ABNORMAL HIGH (ref 4.0–10.5)
WBC: 16.5 10*3/uL — ABNORMAL HIGH (ref 4.0–10.5)

## 2010-11-26 LAB — BASIC METABOLIC PANEL
BUN: 13 mg/dL (ref 6–23)
BUN: 18 mg/dL (ref 6–23)
BUN: 22 mg/dL (ref 6–23)
BUN: 26 mg/dL — ABNORMAL HIGH (ref 6–23)
CO2: 32 mEq/L (ref 19–32)
CO2: 33 mEq/L — ABNORMAL HIGH (ref 19–32)
CO2: 35 mEq/L — ABNORMAL HIGH (ref 19–32)
Calcium: 8.5 mg/dL (ref 8.4–10.5)
Calcium: 8.6 mg/dL (ref 8.4–10.5)
Calcium: 8.6 mg/dL (ref 8.4–10.5)
Chloride: 102 mEq/L (ref 96–112)
Chloride: 90 mEq/L — ABNORMAL LOW (ref 96–112)
Chloride: 97 mEq/L (ref 96–112)
Creatinine, Ser: 1.05 mg/dL (ref 0.4–1.2)
Creatinine, Ser: 1.15 mg/dL (ref 0.4–1.2)
GFR calc Af Amer: 50 mL/min — ABNORMAL LOW (ref >60)
GFR calc Af Amer: 53 mL/min — ABNORMAL LOW (ref >60)
GFR calc non Af Amer: 41 mL/min — ABNORMAL LOW (ref >60)
GFR calc non Af Amer: 43 mL/min — ABNORMAL LOW (ref >60)
GFR calc non Af Amer: 44 mL/min — ABNORMAL LOW (ref >60)
Glucose, Bld: 110 mg/dL — ABNORMAL HIGH (ref 70–99)
Glucose, Bld: 153 mg/dL — ABNORMAL HIGH (ref 70–99)
Glucose, Bld: 154 mg/dL — ABNORMAL HIGH (ref 70–99)
Glucose, Bld: 194 mg/dL — ABNORMAL HIGH (ref 70–99)
Glucose, Bld: 406 mg/dL — ABNORMAL HIGH (ref 70–99)
Potassium: 3.4 mEq/L — ABNORMAL LOW (ref 3.5–5.1)
Potassium: 3.6 mEq/L (ref 3.5–5.1)
Potassium: 3.8 mEq/L (ref 3.5–5.1)
Sodium: 135 mEq/L (ref 135–145)
Sodium: 137 mEq/L (ref 135–145)

## 2010-11-26 LAB — COMPREHENSIVE METABOLIC PANEL
AST: 72 U/L — ABNORMAL HIGH (ref 0–37)
Albumin: 2.5 g/dL — ABNORMAL LOW (ref 3.5–5.2)
Albumin: 3 g/dL — ABNORMAL LOW (ref 3.5–5.2)
Alkaline Phosphatase: 58 U/L (ref 39–117)
Alkaline Phosphatase: 67 U/L (ref 39–117)
BUN: 12 mg/dL (ref 6–23)
CO2: 26 mEq/L (ref 19–32)
Chloride: 101 mEq/L (ref 96–112)
Chloride: 108 mEq/L (ref 96–112)
GFR calc Af Amer: 58 mL/min — ABNORMAL LOW (ref >60)
GFR calc non Af Amer: 49 mL/min — ABNORMAL LOW (ref >60)
Glucose, Bld: 126 mg/dL — ABNORMAL HIGH (ref 70–99)
Potassium: 2.9 mEq/L — ABNORMAL LOW (ref 3.5–5.1)
Potassium: 4 mEq/L (ref 3.5–5.1)
Total Bilirubin: 0.7 mg/dL (ref 0.3–1.2)
Total Bilirubin: 1.2 mg/dL (ref 0.3–1.2)

## 2010-11-26 LAB — CARDIAC PANEL(CRET KIN+CKTOT+MB+TROPI)
CK, MB: 1.9 ng/mL (ref 0.3–4.0)
Relative Index: INVALID (ref 0.0–2.5)
Total CK: 43 U/L (ref 7–177)
Total CK: 47 U/L (ref 7–177)

## 2010-11-26 LAB — MAGNESIUM
Magnesium: 1.5 mg/dL (ref 1.5–2.5)
Magnesium: 1.7 mg/dL (ref 1.5–2.5)

## 2010-11-26 LAB — BRAIN NATRIURETIC PEPTIDE
Pro B Natriuretic peptide (BNP): 434 pg/mL — ABNORMAL HIGH (ref 0.0–100.0)
Pro B Natriuretic peptide (BNP): 953 pg/mL — ABNORMAL HIGH (ref 0.0–100.0)
Pro B Natriuretic peptide (BNP): 982 pg/mL — ABNORMAL HIGH (ref 0.0–100.0)

## 2010-11-26 LAB — URINALYSIS, ROUTINE W REFLEX MICROSCOPIC
Glucose, UA: NEGATIVE mg/dL
Leukocytes, UA: NEGATIVE
Protein, ur: 30 mg/dL — AB
Specific Gravity, Urine: >1.03 — ABNORMAL HIGH (ref 1.005–1.030)
Urobilinogen, UA: 0.2 mg/dL (ref 0.0–1.0)

## 2010-11-26 LAB — CULTURE, BLOOD (ROUTINE X 2)
Culture: NO GROWTH
Culture: NO GROWTH

## 2010-11-26 LAB — PROTIME-INR
Prothrombin Time: 19.2 seconds — ABNORMAL HIGH (ref 11.6–15.2)
Prothrombin Time: 23.6 seconds — ABNORMAL HIGH (ref 11.6–15.2)
Prothrombin Time: 33.7 seconds — ABNORMAL HIGH (ref 11.6–15.2)

## 2010-11-26 LAB — VANCOMYCIN, TROUGH: Vancomycin Tr: 11.6 ug/mL (ref 10.0–20.0)

## 2010-11-26 LAB — PHOSPHORUS: Phosphorus: 1.9 mg/dL — ABNORMAL LOW (ref 2.3–4.6)

## 2010-11-26 LAB — POCT CARDIAC MARKERS: Myoglobin, poc: 78.2 ng/mL (ref 12–200)

## 2010-11-26 LAB — URINE MICROSCOPIC-ADD ON

## 2010-11-27 LAB — URINALYSIS, ROUTINE W REFLEX MICROSCOPIC
Bilirubin Urine: NEGATIVE
Nitrite: NEGATIVE
Protein, ur: NEGATIVE mg/dL
Specific Gravity, Urine: 1.025 (ref 1.005–1.030)
Urobilinogen, UA: 0.2 mg/dL (ref 0.0–1.0)

## 2010-11-27 LAB — COMPREHENSIVE METABOLIC PANEL
Albumin: 3.2 g/dL — ABNORMAL LOW (ref 3.5–5.2)
BUN: 20 mg/dL (ref 6–23)
Chloride: 99 mEq/L (ref 96–112)
Creatinine, Ser: 1.4 mg/dL — ABNORMAL HIGH (ref 0.4–1.2)
Total Bilirubin: 0.6 mg/dL (ref 0.3–1.2)

## 2010-11-27 LAB — CBC
HCT: 39.5 % (ref 36.0–46.0)
Hemoglobin: 13.8 g/dL (ref 12.0–15.0)
MCV: 90 fL (ref 78.0–100.0)
Platelets: 254 10*3/uL (ref 150–400)
RBC: 4.58 MIL/uL (ref 3.87–5.11)
RDW: 13.5 % (ref 11.5–15.5)
WBC: 10.3 10*3/uL (ref 4.0–10.5)

## 2010-11-27 LAB — BASIC METABOLIC PANEL
CO2: 31 mEq/L (ref 19–32)
Calcium: 9.3 mg/dL (ref 8.4–10.5)
GFR calc Af Amer: 50 mL/min — ABNORMAL LOW (ref >60)
GFR calc non Af Amer: 41 mL/min — ABNORMAL LOW (ref >60)
Sodium: 138 mEq/L (ref 135–145)

## 2010-11-27 LAB — DIFFERENTIAL
Basophils Absolute: 0 10*3/uL (ref 0.0–0.1)
Lymphocytes Relative: 33 % (ref 12–46)
Lymphocytes Relative: 36 % (ref 12–46)
Monocytes Absolute: 0.6 10*3/uL (ref 0.1–1.0)
Monocytes Absolute: 0.8 10*3/uL (ref 0.1–1.0)
Monocytes Relative: 8 % (ref 3–12)
Neutro Abs: 5.2 10*3/uL (ref 1.7–7.7)
Neutro Abs: 6.2 10*3/uL (ref 1.7–7.7)
Neutrophils Relative %: 60 % (ref 43–77)

## 2010-11-27 LAB — POCT CARDIAC MARKERS
Myoglobin, poc: 57.2 ng/mL (ref 12–200)
Troponin i, poc: <0.05 ng/mL (ref 0.00–0.09)

## 2010-11-27 LAB — LIPASE, BLOOD: Lipase: 19 U/L (ref 11–59)

## 2010-12-12 ENCOUNTER — Other Ambulatory Visit: Payer: Self-pay | Admitting: *Deleted

## 2010-12-12 MED ORDER — MIDODRINE HCL 5 MG PO TABS: 5.0000 mg | ORAL_TABLET | Freq: Two times a day (BID) | ORAL | Status: DC

## 2010-12-13 LAB — DIFFERENTIAL
Basophils Absolute: 0 10*3/uL (ref 0.0–0.1)
Lymphocytes Relative: 45 % (ref 12–46)
Lymphs Abs: 4.8 10*3/uL — ABNORMAL HIGH (ref 0.7–4.0)
Neutro Abs: 4.5 10*3/uL (ref 1.7–7.7)
Neutrophils Relative %: 42 % — ABNORMAL LOW (ref 43–77)

## 2010-12-13 LAB — GLUCOSE, CAPILLARY
Glucose-Capillary: 169 mg/dL — ABNORMAL HIGH (ref 70–99)
Glucose-Capillary: 170 mg/dL — ABNORMAL HIGH (ref 70–99)
Glucose-Capillary: 284 mg/dL — ABNORMAL HIGH (ref 70–99)
Glucose-Capillary: 302 mg/dL — ABNORMAL HIGH (ref 70–99)
Glucose-Capillary: 220 mg/dL — ABNORMAL HIGH (ref 70–99)
Glucose-Capillary: 299 mg/dL — ABNORMAL HIGH (ref 70–99)
Glucose-Capillary: 49 mg/dL — ABNORMAL LOW (ref 70–99)
Glucose-Capillary: 81 mg/dL (ref 70–99)

## 2010-12-13 LAB — PROTIME-INR
INR: 2.7 — ABNORMAL HIGH (ref 0.00–1.49)
INR: 2.5 — ABNORMAL HIGH (ref 0.00–1.49)
Prothrombin Time: 28.5 seconds — ABNORMAL HIGH (ref 11.6–15.2)
Prothrombin Time: 26.7 seconds — ABNORMAL HIGH (ref 11.6–15.2)

## 2010-12-13 LAB — CBC
HCT: 37.9 % (ref 36.0–46.0)
MCHC: 33.9 g/dL (ref 30.0–36.0)
MCHC: 34.1 g/dL (ref 30.0–36.0)
MCV: 93.6 fL (ref 78.0–100.0)
Platelets: 219 10*3/uL (ref 150–400)
Platelets: 231 10*3/uL (ref 150–400)
Platelets: 231 10*3/uL (ref 150–400)
RBC: 4.2 MIL/uL (ref 3.87–5.11)
RDW: 13.6 % (ref 11.5–15.5)
RDW: 13.7 % (ref 11.5–15.5)
WBC: 10.6 10*3/uL — ABNORMAL HIGH (ref 4.0–10.5)

## 2010-12-13 LAB — BASIC METABOLIC PANEL
BUN: 20 mg/dL (ref 6–23)
BUN: 27 mg/dL — ABNORMAL HIGH (ref 6–23)
BUN: 23 mg/dL (ref 6–23)
CO2: 27 mEq/L (ref 19–32)
CO2: 29 mEq/L (ref 19–32)
CO2: 33 mEq/L — ABNORMAL HIGH (ref 19–32)
Calcium: 8.8 mg/dL (ref 8.4–10.5)
Calcium: 9.7 mg/dL (ref 8.4–10.5)
Calcium: 9.7 mg/dL (ref 8.4–10.5)
Chloride: 102 mEq/L (ref 96–112)
Chloride: 105 mEq/L (ref 96–112)
Creatinine, Ser: 1.29 mg/dL — ABNORMAL HIGH (ref 0.4–1.2)
Creatinine, Ser: 1.3 mg/dL — ABNORMAL HIGH (ref 0.4–1.2)
GFR calc Af Amer: 48 mL/min — ABNORMAL LOW (ref >60)
GFR calc Af Amer: 49 mL/min — ABNORMAL LOW (ref >60)
GFR calc non Af Amer: 45 mL/min — ABNORMAL LOW (ref >60)
Glucose, Bld: 105 mg/dL — ABNORMAL HIGH (ref 70–99)
Glucose, Bld: 276 mg/dL — ABNORMAL HIGH (ref 70–99)
Glucose, Bld: 138 mg/dL — ABNORMAL HIGH (ref 70–99)
Potassium: 5 mEq/L (ref 3.5–5.1)
Potassium: 4.1 mEq/L (ref 3.5–5.1)
Sodium: 137 mEq/L (ref 135–145)
Sodium: 140 mEq/L (ref 135–145)

## 2010-12-13 LAB — CARDIAC PANEL(CRET KIN+CKTOT+MB+TROPI)
Relative Index: INVALID (ref 0.0–2.5)
Relative Index: INVALID (ref 0.0–2.5)
Total CK: 57 U/L (ref 7–177)
Troponin I: 0.04 ng/mL (ref 0.00–0.06)
Troponin I: 0.06 ng/mL (ref 0.00–0.06)

## 2010-12-13 LAB — BRAIN NATRIURETIC PEPTIDE: Pro B Natriuretic peptide (BNP): 151 pg/mL — ABNORMAL HIGH (ref 0.0–100.0)

## 2010-12-13 LAB — MAGNESIUM
Magnesium: 1.9 mg/dL (ref 1.5–2.5)
Magnesium: 1.8 mg/dL (ref 1.5–2.5)

## 2010-12-13 LAB — AMIODARONE LEVEL
Amiodarone Lvl: <0.1 ug/mL (ref 0.50–2.00)
N-Desethyl-Amiodarone: <0.1 ug/mL (ref 0.25–3.50)

## 2010-12-14 LAB — GLUCOSE, CAPILLARY
Glucose-Capillary: 107 mg/dL — ABNORMAL HIGH (ref 70–99)
Glucose-Capillary: 117 mg/dL — ABNORMAL HIGH (ref 70–99)
Glucose-Capillary: 122 mg/dL — ABNORMAL HIGH (ref 70–99)
Glucose-Capillary: 123 mg/dL — ABNORMAL HIGH (ref 70–99)
Glucose-Capillary: 128 mg/dL — ABNORMAL HIGH (ref 70–99)
Glucose-Capillary: 132 mg/dL — ABNORMAL HIGH (ref 70–99)
Glucose-Capillary: 132 mg/dL — ABNORMAL HIGH (ref 70–99)
Glucose-Capillary: 148 mg/dL — ABNORMAL HIGH (ref 70–99)
Glucose-Capillary: 166 mg/dL — ABNORMAL HIGH (ref 70–99)
Glucose-Capillary: 180 mg/dL — ABNORMAL HIGH (ref 70–99)
Glucose-Capillary: 187 mg/dL — ABNORMAL HIGH (ref 70–99)
Glucose-Capillary: 192 mg/dL — ABNORMAL HIGH (ref 70–99)
Glucose-Capillary: 196 mg/dL — ABNORMAL HIGH (ref 70–99)
Glucose-Capillary: 199 mg/dL — ABNORMAL HIGH (ref 70–99)
Glucose-Capillary: 202 mg/dL — ABNORMAL HIGH (ref 70–99)
Glucose-Capillary: 217 mg/dL — ABNORMAL HIGH (ref 70–99)
Glucose-Capillary: 247 mg/dL — ABNORMAL HIGH (ref 70–99)
Glucose-Capillary: 44 mg/dL — ABNORMAL LOW (ref 70–99)
Glucose-Capillary: 56 mg/dL — ABNORMAL LOW (ref 70–99)
Glucose-Capillary: 56 mg/dL — ABNORMAL LOW (ref 70–99)
Glucose-Capillary: 62 mg/dL — ABNORMAL LOW (ref 70–99)
Glucose-Capillary: 97 mg/dL (ref 70–99)
Glucose-Capillary: 98 mg/dL (ref 70–99)
Glucose-Capillary: 98 mg/dL (ref 70–99)
Glucose-Capillary: 121 mg/dL — ABNORMAL HIGH (ref 70–99)
Glucose-Capillary: 287 mg/dL — ABNORMAL HIGH (ref 70–99)
Glucose-Capillary: 88 mg/dL (ref 70–99)
Glucose-Capillary: 89 mg/dL (ref 70–99)

## 2010-12-14 LAB — BASIC METABOLIC PANEL
BUN: 22 mg/dL (ref 6–23)
BUN: 23 mg/dL (ref 6–23)
BUN: 29 mg/dL — ABNORMAL HIGH (ref 6–23)
BUN: 30 mg/dL — ABNORMAL HIGH (ref 6–23)
BUN: 25 mg/dL — ABNORMAL HIGH (ref 6–23)
CO2: 28 mEq/L (ref 19–32)
CO2: 31 mEq/L (ref 19–32)
CO2: 28 mEq/L (ref 19–32)
CO2: 33 mEq/L — ABNORMAL HIGH (ref 19–32)
Calcium: 9.1 mg/dL (ref 8.4–10.5)
Calcium: 9.2 mg/dL (ref 8.4–10.5)
Calcium: 9.5 mg/dL (ref 8.4–10.5)
Calcium: 9.6 mg/dL (ref 8.4–10.5)
Chloride: 102 mEq/L (ref 96–112)
Chloride: 102 mEq/L (ref 96–112)
Chloride: 103 mEq/L (ref 96–112)
Chloride: 104 mEq/L (ref 96–112)
Chloride: 106 mEq/L (ref 96–112)
Chloride: 101 mEq/L (ref 96–112)
Chloride: 107 mEq/L (ref 96–112)
Creatinine, Ser: 1.19 mg/dL (ref 0.4–1.2)
Creatinine, Ser: 1.27 mg/dL — ABNORMAL HIGH (ref 0.4–1.2)
Creatinine, Ser: 1.37 mg/dL — ABNORMAL HIGH (ref 0.4–1.2)
Creatinine, Ser: 1.47 mg/dL — ABNORMAL HIGH (ref 0.4–1.2)
Creatinine, Ser: 1.48 mg/dL — ABNORMAL HIGH (ref 0.4–1.2)
GFR calc Af Amer: 42 mL/min — ABNORMAL LOW (ref >60)
GFR calc Af Amer: 42 mL/min — ABNORMAL LOW (ref >60)
GFR calc Af Amer: 45 mL/min — ABNORMAL LOW (ref >60)
GFR calc Af Amer: 47 mL/min — ABNORMAL LOW (ref >60)
GFR calc Af Amer: 50 mL/min — ABNORMAL LOW (ref >60)
GFR calc Af Amer: 54 mL/min — ABNORMAL LOW (ref >60)
GFR calc Af Amer: >60 mL/min (ref >60)
GFR calc Af Amer: >60 mL/min (ref >60)
GFR calc non Af Amer: 35 mL/min — ABNORMAL LOW (ref >60)
GFR calc non Af Amer: 38 mL/min — ABNORMAL LOW (ref >60)
GFR calc non Af Amer: 39 mL/min — ABNORMAL LOW (ref >60)
GFR calc non Af Amer: 41 mL/min — ABNORMAL LOW (ref >60)
Glucose, Bld: 110 mg/dL — ABNORMAL HIGH (ref 70–99)
Glucose, Bld: 160 mg/dL — ABNORMAL HIGH (ref 70–99)
Glucose, Bld: 58 mg/dL — ABNORMAL LOW (ref 70–99)
Glucose, Bld: 115 mg/dL — ABNORMAL HIGH (ref 70–99)
Potassium: 3.8 mEq/L (ref 3.5–5.1)
Potassium: 4.3 mEq/L (ref 3.5–5.1)
Potassium: 4.8 mEq/L (ref 3.5–5.1)
Potassium: 5 mEq/L (ref 3.5–5.1)
Potassium: 5.4 mEq/L — ABNORMAL HIGH (ref 3.5–5.1)
Potassium: 4.3 mEq/L (ref 3.5–5.1)
Sodium: 139 mEq/L (ref 135–145)
Sodium: 139 mEq/L (ref 135–145)
Sodium: 140 mEq/L (ref 135–145)
Sodium: 135 mEq/L (ref 135–145)
Sodium: 142 mEq/L (ref 135–145)

## 2010-12-14 LAB — CBC
HCT: 34.5 % — ABNORMAL LOW (ref 36.0–46.0)
HCT: 34.9 % — ABNORMAL LOW (ref 36.0–46.0)
HCT: 35.5 % — ABNORMAL LOW (ref 36.0–46.0)
HCT: 35.6 % — ABNORMAL LOW (ref 36.0–46.0)
HCT: 36.3 % (ref 36.0–46.0)
HCT: 33.8 % — ABNORMAL LOW (ref 36.0–46.0)
HCT: 34.6 % — ABNORMAL LOW (ref 36.0–46.0)
Hemoglobin: 11.6 g/dL — ABNORMAL LOW (ref 12.0–15.0)
Hemoglobin: 12 g/dL (ref 12.0–15.0)
Hemoglobin: 12.2 g/dL (ref 12.0–15.0)
Hemoglobin: 12.3 g/dL (ref 12.0–15.0)
Hemoglobin: 12.3 g/dL (ref 12.0–15.0)
Hemoglobin: 11.6 g/dL — ABNORMAL LOW (ref 12.0–15.0)
Hemoglobin: 12.1 g/dL (ref 12.0–15.0)
MCHC: 33.8 g/dL (ref 30.0–36.0)
MCHC: 33.8 g/dL (ref 30.0–36.0)
MCHC: 34.2 g/dL (ref 30.0–36.0)
MCHC: 34.2 g/dL (ref 30.0–36.0)
MCHC: 34.3 g/dL (ref 30.0–36.0)
MCHC: 34.5 g/dL (ref 30.0–36.0)
MCHC: 34.7 g/dL (ref 30.0–36.0)
MCV: 93.2 fL (ref 78.0–100.0)
MCV: 93.2 fL (ref 78.0–100.0)
MCV: 93.5 fL (ref 78.0–100.0)
MCV: 94 fL (ref 78.0–100.0)
MCV: 91.3 fL (ref 78.0–100.0)
MCV: 92 fL (ref 78.0–100.0)
MCV: 92.5 fL (ref 78.0–100.0)
Platelets: 181 10*3/uL (ref 150–400)
Platelets: 199 10*3/uL (ref 150–400)
Platelets: 203 10*3/uL (ref 150–400)
Platelets: 213 10*3/uL (ref 150–400)
Platelets: 201 10*3/uL (ref 150–400)
RBC: 3.31 MIL/uL — ABNORMAL LOW (ref 3.87–5.11)
RBC: 3.39 MIL/uL — ABNORMAL LOW (ref 3.87–5.11)
RBC: 3.56 MIL/uL — ABNORMAL LOW (ref 3.87–5.11)
RBC: 3.69 MIL/uL — ABNORMAL LOW (ref 3.87–5.11)
RBC: 3.8 MIL/uL — ABNORMAL LOW (ref 3.87–5.11)
RBC: 3.82 MIL/uL — ABNORMAL LOW (ref 3.87–5.11)
RBC: 3.86 MIL/uL — ABNORMAL LOW (ref 3.87–5.11)
RBC: 3.9 MIL/uL (ref 3.87–5.11)
RBC: 3.74 MIL/uL — ABNORMAL LOW (ref 3.87–5.11)
RBC: 3.81 MIL/uL — ABNORMAL LOW (ref 3.87–5.11)
RDW: 13.4 % (ref 11.5–15.5)
RDW: 13.6 % (ref 11.5–15.5)
RDW: 13.9 % (ref 11.5–15.5)
RDW: 13.1 % (ref 11.5–15.5)
WBC: 10.8 10*3/uL — ABNORMAL HIGH (ref 4.0–10.5)
WBC: 11.3 10*3/uL — ABNORMAL HIGH (ref 4.0–10.5)
WBC: 11.6 10*3/uL — ABNORMAL HIGH (ref 4.0–10.5)
WBC: 12.7 10*3/uL — ABNORMAL HIGH (ref 4.0–10.5)
WBC: 9.8 10*3/uL (ref 4.0–10.5)
WBC: 10.1 10*3/uL (ref 4.0–10.5)
WBC: 8.5 10*3/uL (ref 4.0–10.5)

## 2010-12-14 LAB — PROTIME-INR
INR: 1 (ref 0.00–1.49)
INR: 1.2 (ref 0.00–1.49)
INR: 1.9 — ABNORMAL HIGH (ref 0.00–1.49)
INR: 2 — ABNORMAL HIGH (ref 0.00–1.49)
INR: 2.3 — ABNORMAL HIGH (ref 0.00–1.49)
INR: 2.4 — ABNORMAL HIGH (ref 0.00–1.49)
Prothrombin Time: 13.2 seconds (ref 11.6–15.2)
Prothrombin Time: 13.2 seconds (ref 11.6–15.2)
Prothrombin Time: 14.9 seconds (ref 11.6–15.2)
Prothrombin Time: 19.7 seconds — ABNORMAL HIGH (ref 11.6–15.2)
Prothrombin Time: 22 seconds — ABNORMAL HIGH (ref 11.6–15.2)
Prothrombin Time: 22.5 seconds — ABNORMAL HIGH (ref 11.6–15.2)
Prothrombin Time: 25.2 seconds — ABNORMAL HIGH (ref 11.6–15.2)
Prothrombin Time: 25.6 seconds — ABNORMAL HIGH (ref 11.6–15.2)

## 2010-12-14 LAB — HEPARIN LEVEL (UNFRACTIONATED)
Heparin Unfractionated: 0.37 IU/mL (ref 0.30–0.70)
Heparin Unfractionated: 0.52 IU/mL (ref 0.30–0.70)
Heparin Unfractionated: 0.53 IU/mL (ref 0.30–0.70)
Heparin Unfractionated: 0.54 IU/mL (ref 0.30–0.70)
Heparin Unfractionated: 0.56 IU/mL (ref 0.30–0.70)
Heparin Unfractionated: 0.66 IU/mL (ref 0.30–0.70)
Heparin Unfractionated: 0.74 IU/mL — ABNORMAL HIGH (ref 0.30–0.70)
Heparin Unfractionated: 0.75 IU/mL — ABNORMAL HIGH (ref 0.30–0.70)

## 2010-12-14 LAB — DIFFERENTIAL
Basophils Absolute: 0 10*3/uL (ref 0.0–0.1)
Basophils Relative: 0 % (ref 0–1)
Basophils Relative: 0 % (ref 0–1)
Eosinophils Absolute: 0.1 10*3/uL (ref 0.0–0.7)
Eosinophils Absolute: 0.4 10*3/uL (ref 0.0–0.7)
Eosinophils Absolute: 0.5 10*3/uL (ref 0.0–0.7)
Eosinophils Relative: 4 % (ref 0–5)
Eosinophils Relative: 5 % (ref 0–5)
Lymphs Abs: 2.8 10*3/uL (ref 0.7–4.0)
Lymphs Abs: 4.7 10*3/uL — ABNORMAL HIGH (ref 0.7–4.0)
Monocytes Absolute: 0.5 10*3/uL (ref 0.1–1.0)
Monocytes Absolute: 0.8 10*3/uL (ref 0.1–1.0)
Monocytes Relative: 6 % (ref 3–12)
Monocytes Relative: 9 % (ref 3–12)
Neutro Abs: 5.1 10*3/uL (ref 1.7–7.7)

## 2010-12-14 LAB — COMPREHENSIVE METABOLIC PANEL
Albumin: 2.9 g/dL — ABNORMAL LOW (ref 3.5–5.2)
BUN: 21 mg/dL (ref 6–23)
Chloride: 104 mEq/L (ref 96–112)
Creatinine, Ser: 1.16 mg/dL (ref 0.4–1.2)
Glucose, Bld: 161 mg/dL — ABNORMAL HIGH (ref 70–99)
Total Bilirubin: 0.9 mg/dL (ref 0.3–1.2)

## 2010-12-14 LAB — POCT CARDIAC MARKERS
Myoglobin, poc: 82.6 ng/mL (ref 12–200)
Myoglobin, poc: 92 ng/mL (ref 12–200)
Troponin i, poc: <0.05 ng/mL (ref 0.00–0.09)

## 2010-12-14 LAB — LIPID PANEL
Cholesterol: 129 mg/dL (ref 0–200)
HDL: 39 mg/dL — ABNORMAL LOW (ref >39)
Total CHOL/HDL Ratio: 3.1 RATIO
Total CHOL/HDL Ratio: 3.3 RATIO
Triglycerides: 73 mg/dL (ref <150)
VLDL: 15 mg/dL (ref 0–40)
VLDL: 16 mg/dL (ref 0–40)

## 2010-12-14 LAB — MAGNESIUM: Magnesium: 1.7 mg/dL (ref 1.5–2.5)

## 2010-12-14 LAB — CARDIAC PANEL(CRET KIN+CKTOT+MB+TROPI)
CK, MB: 1.7 ng/mL (ref 0.3–4.0)
CK, MB: 2.7 ng/mL (ref 0.3–4.0)
CK, MB: 2.9 ng/mL (ref 0.3–4.0)
Relative Index: INVALID (ref 0.0–2.5)
Relative Index: INVALID (ref 0.0–2.5)
Total CK: 31 U/L (ref 7–177)
Total CK: 47 U/L (ref 7–177)
Total CK: 52 U/L (ref 7–177)
Total CK: 55 U/L (ref 7–177)
Troponin I: 0.52 ng/mL (ref 0.00–0.06)
Troponin I: 0.13 ng/mL — ABNORMAL HIGH (ref 0.00–0.06)
Troponin I: 0.18 ng/mL — ABNORMAL HIGH (ref 0.00–0.06)

## 2010-12-14 LAB — HEMOGLOBIN A1C: Hgb A1c MFr Bld: 8.3 % — ABNORMAL HIGH (ref 4.6–6.1)

## 2010-12-24 LAB — URINE CULTURE
Colony Count: NO GROWTH
Colony Count: 25000
Culture: NO GROWTH

## 2010-12-24 LAB — CBC
HCT: 26.3 % — ABNORMAL LOW (ref 36.0–46.0)
HCT: 26.5 % — ABNORMAL LOW (ref 36.0–46.0)
HCT: 27.1 % — ABNORMAL LOW (ref 36.0–46.0)
HCT: 28.9 % — ABNORMAL LOW (ref 36.0–46.0)
HCT: 29.6 % — ABNORMAL LOW (ref 36.0–46.0)
HCT: 29.9 % — ABNORMAL LOW (ref 36.0–46.0)
HCT: 27.1 % — ABNORMAL LOW (ref 36.0–46.0)
HCT: 29.3 % — ABNORMAL LOW (ref 36.0–46.0)
Hemoglobin: 10.5 g/dL — ABNORMAL LOW (ref 12.0–15.0)
Hemoglobin: 8.9 g/dL — ABNORMAL LOW (ref 12.0–15.0)
Hemoglobin: 8.9 g/dL — ABNORMAL LOW (ref 12.0–15.0)
Hemoglobin: 9.2 g/dL — ABNORMAL LOW (ref 12.0–15.0)
Hemoglobin: 9.3 g/dL — ABNORMAL LOW (ref 12.0–15.0)
Hemoglobin: 9.8 g/dL — ABNORMAL LOW (ref 12.0–15.0)
Hemoglobin: 10.1 g/dL — ABNORMAL LOW (ref 12.0–15.0)
Hemoglobin: 10.3 g/dL — ABNORMAL LOW (ref 12.0–15.0)
Hemoglobin: 9.2 g/dL — ABNORMAL LOW (ref 12.0–15.0)
MCHC: 33.4 g/dL (ref 30.0–36.0)
MCHC: 33.8 g/dL (ref 30.0–36.0)
MCHC: 34 g/dL (ref 30.0–36.0)
MCHC: 34.3 g/dL (ref 30.0–36.0)
MCHC: 34.7 g/dL (ref 30.0–36.0)
MCHC: 34.9 g/dL (ref 30.0–36.0)
MCHC: 35.3 g/dL (ref 30.0–36.0)
MCHC: 33.7 g/dL (ref 30.0–36.0)
MCHC: 34.5 g/dL (ref 30.0–36.0)
MCV: 88.5 fL (ref 78.0–100.0)
MCV: 88.6 fL (ref 78.0–100.0)
MCV: 88.9 fL (ref 78.0–100.0)
MCV: 89.5 fL (ref 78.0–100.0)
MCV: 89.6 fL (ref 78.0–100.0)
MCV: 89.7 fL (ref 78.0–100.0)
MCV: 90 fL (ref 78.0–100.0)
MCV: 90 fL (ref 78.0–100.0)
MCV: 90.8 fL (ref 78.0–100.0)
Platelets: 367 10*3/uL (ref 150–400)
Platelets: 398 10*3/uL (ref 150–400)
Platelets: 407 10*3/uL — ABNORMAL HIGH (ref 150–400)
Platelets: 439 10*3/uL — ABNORMAL HIGH (ref 150–400)
Platelets: 441 10*3/uL — ABNORMAL HIGH (ref 150–400)
Platelets: 383 10*3/uL (ref 150–400)
RBC: 2.57 MIL/uL — ABNORMAL LOW (ref 3.87–5.11)
RBC: 2.91 MIL/uL — ABNORMAL LOW (ref 3.87–5.11)
RBC: 2.93 MIL/uL — ABNORMAL LOW (ref 3.87–5.11)
RBC: 2.96 MIL/uL — ABNORMAL LOW (ref 3.87–5.11)
RBC: 3.03 MIL/uL — ABNORMAL LOW (ref 3.87–5.11)
RBC: 3.22 MIL/uL — ABNORMAL LOW (ref 3.87–5.11)
RBC: 3.37 MIL/uL — ABNORMAL LOW (ref 3.87–5.11)
RBC: 3.38 MIL/uL — ABNORMAL LOW (ref 3.87–5.11)
RDW: 12.9 % (ref 11.5–15.5)
RDW: 13.6 % (ref 11.5–15.5)
RDW: 13.6 % (ref 11.5–15.5)
RDW: 13.7 % (ref 11.5–15.5)
WBC: 11.1 10*3/uL — ABNORMAL HIGH (ref 4.0–10.5)
WBC: 12.3 10*3/uL — ABNORMAL HIGH (ref 4.0–10.5)
WBC: 12.3 10*3/uL — ABNORMAL HIGH (ref 4.0–10.5)
WBC: 12.5 10*3/uL — ABNORMAL HIGH (ref 4.0–10.5)
WBC: 13.4 10*3/uL — ABNORMAL HIGH (ref 4.0–10.5)
WBC: 14 10*3/uL — ABNORMAL HIGH (ref 4.0–10.5)
WBC: 15.1 10*3/uL — ABNORMAL HIGH (ref 4.0–10.5)
WBC: 15.7 10*3/uL — ABNORMAL HIGH (ref 4.0–10.5)
WBC: 17.7 10*3/uL — ABNORMAL HIGH (ref 4.0–10.5)
WBC: 9.8 10*3/uL (ref 4.0–10.5)
WBC: 9.9 10*3/uL (ref 4.0–10.5)

## 2010-12-24 LAB — DIFFERENTIAL
Basophils Absolute: 0 10*3/uL (ref 0.0–0.1)
Basophils Absolute: 0 10*3/uL (ref 0.0–0.1)
Basophils Relative: 0 % (ref 0–1)
Basophils Relative: 0 % (ref 0–1)
Basophils Relative: 0 % (ref 0–1)
Basophils Relative: 0 % (ref 0–1)
Eosinophils Absolute: 0.4 10*3/uL (ref 0.0–0.7)
Eosinophils Absolute: 0.7 10*3/uL (ref 0.0–0.7)
Eosinophils Relative: 2 % (ref 0–5)
Eosinophils Relative: 11 % — ABNORMAL HIGH (ref 0–5)
Lymphocytes Relative: 37 % (ref 12–46)
Lymphocytes Relative: 40 % (ref 12–46)
Lymphs Abs: 3 10*3/uL (ref 0.7–4.0)
Lymphs Abs: 3.7 10*3/uL (ref 0.7–4.0)
Monocytes Absolute: 0.8 10*3/uL (ref 0.1–1.0)
Monocytes Relative: 9 % (ref 3–12)
Monocytes Relative: 9 % (ref 3–12)
Neutro Abs: 7.5 10*3/uL (ref 1.7–7.7)
Neutro Abs: 4 10*3/uL (ref 1.7–7.7)
Neutro Abs: 4 10*3/uL (ref 1.7–7.7)
Neutro Abs: 6.5 10*3/uL (ref 1.7–7.7)
Neutrophils Relative %: 61 % (ref 43–77)
Neutrophils Relative %: 41 % — ABNORMAL LOW (ref 43–77)
Neutrophils Relative %: 48 % (ref 43–77)

## 2010-12-24 LAB — BASIC METABOLIC PANEL
BUN: 13 mg/dL (ref 6–23)
BUN: 17 mg/dL (ref 6–23)
BUN: 22 mg/dL (ref 6–23)
BUN: 26 mg/dL — ABNORMAL HIGH (ref 6–23)
BUN: 29 mg/dL — ABNORMAL HIGH (ref 6–23)
BUN: 21 mg/dL (ref 6–23)
CO2: 28 mEq/L (ref 19–32)
CO2: 29 mEq/L (ref 19–32)
CO2: 31 mEq/L (ref 19–32)
CO2: 32 mEq/L (ref 19–32)
Calcium: 8.6 mg/dL (ref 8.4–10.5)
Calcium: 9.1 mg/dL (ref 8.4–10.5)
Calcium: 9 mg/dL (ref 8.4–10.5)
Chloride: 102 mEq/L (ref 96–112)
Chloride: 93 mEq/L — ABNORMAL LOW (ref 96–112)
Chloride: 97 mEq/L (ref 96–112)
Chloride: 97 mEq/L (ref 96–112)
Chloride: 98 mEq/L (ref 96–112)
Chloride: 98 mEq/L (ref 96–112)
Chloride: 99 mEq/L (ref 96–112)
Chloride: 99 mEq/L (ref 96–112)
Creatinine, Ser: 1.2 mg/dL (ref 0.4–1.2)
Creatinine, Ser: 1.24 mg/dL — ABNORMAL HIGH (ref 0.4–1.2)
Creatinine, Ser: 1.24 mg/dL — ABNORMAL HIGH (ref 0.4–1.2)
Creatinine, Ser: 1.25 mg/dL — ABNORMAL HIGH (ref 0.4–1.2)
Creatinine, Ser: 1.31 mg/dL — ABNORMAL HIGH (ref 0.4–1.2)
Creatinine, Ser: 1.43 mg/dL — ABNORMAL HIGH (ref 0.4–1.2)
Creatinine, Ser: 1.43 mg/dL — ABNORMAL HIGH (ref 0.4–1.2)
GFR calc Af Amer: 38 mL/min — ABNORMAL LOW (ref >60)
GFR calc Af Amer: 40 mL/min — ABNORMAL LOW (ref >60)
GFR calc Af Amer: 48 mL/min — ABNORMAL LOW (ref >60)
GFR calc Af Amer: 51 mL/min — ABNORMAL LOW (ref >60)
GFR calc Af Amer: 51 mL/min — ABNORMAL LOW (ref >60)
GFR calc Af Amer: 53 mL/min — ABNORMAL LOW (ref >60)
GFR calc non Af Amer: 36 mL/min — ABNORMAL LOW (ref >60)
GFR calc non Af Amer: 36 mL/min — ABNORMAL LOW (ref >60)
GFR calc non Af Amer: 42 mL/min — ABNORMAL LOW (ref >60)
GFR calc non Af Amer: 40 mL/min — ABNORMAL LOW (ref >60)
Glucose, Bld: 223 mg/dL — ABNORMAL HIGH (ref 70–99)
Glucose, Bld: 59 mg/dL — ABNORMAL LOW (ref 70–99)
Potassium: 4.7 mEq/L (ref 3.5–5.1)
Potassium: 4.7 mEq/L (ref 3.5–5.1)
Potassium: 5.1 mEq/L (ref 3.5–5.1)
Potassium: 5.2 mEq/L — ABNORMAL HIGH (ref 3.5–5.1)
Potassium: 5.4 mEq/L — ABNORMAL HIGH (ref 3.5–5.1)
Potassium: 5.8 mEq/L — ABNORMAL HIGH (ref 3.5–5.1)
Potassium: 4.2 mEq/L (ref 3.5–5.1)
Sodium: 133 mEq/L — ABNORMAL LOW (ref 135–145)
Sodium: 133 mEq/L — ABNORMAL LOW (ref 135–145)
Sodium: 133 mEq/L — ABNORMAL LOW (ref 135–145)
Sodium: 134 mEq/L — ABNORMAL LOW (ref 135–145)
Sodium: 137 mEq/L (ref 135–145)
Sodium: 136 mEq/L (ref 135–145)

## 2010-12-24 LAB — GLUCOSE, CAPILLARY
Glucose-Capillary: 104 mg/dL — ABNORMAL HIGH (ref 70–99)
Glucose-Capillary: 107 mg/dL — ABNORMAL HIGH (ref 70–99)
Glucose-Capillary: 148 mg/dL — ABNORMAL HIGH (ref 70–99)
Glucose-Capillary: 154 mg/dL — ABNORMAL HIGH (ref 70–99)
Glucose-Capillary: 161 mg/dL — ABNORMAL HIGH (ref 70–99)
Glucose-Capillary: 164 mg/dL — ABNORMAL HIGH (ref 70–99)
Glucose-Capillary: 168 mg/dL — ABNORMAL HIGH (ref 70–99)
Glucose-Capillary: 190 mg/dL — ABNORMAL HIGH (ref 70–99)
Glucose-Capillary: 190 mg/dL — ABNORMAL HIGH (ref 70–99)
Glucose-Capillary: 198 mg/dL — ABNORMAL HIGH (ref 70–99)
Glucose-Capillary: 217 mg/dL — ABNORMAL HIGH (ref 70–99)
Glucose-Capillary: 220 mg/dL — ABNORMAL HIGH (ref 70–99)
Glucose-Capillary: 225 mg/dL — ABNORMAL HIGH (ref 70–99)
Glucose-Capillary: 226 mg/dL — ABNORMAL HIGH (ref 70–99)
Glucose-Capillary: 238 mg/dL — ABNORMAL HIGH (ref 70–99)
Glucose-Capillary: 242 mg/dL — ABNORMAL HIGH (ref 70–99)
Glucose-Capillary: 258 mg/dL — ABNORMAL HIGH (ref 70–99)
Glucose-Capillary: 265 mg/dL — ABNORMAL HIGH (ref 70–99)
Glucose-Capillary: 269 mg/dL — ABNORMAL HIGH (ref 70–99)
Glucose-Capillary: 282 mg/dL — ABNORMAL HIGH (ref 70–99)
Glucose-Capillary: 282 mg/dL — ABNORMAL HIGH (ref 70–99)
Glucose-Capillary: 287 mg/dL — ABNORMAL HIGH (ref 70–99)
Glucose-Capillary: 291 mg/dL — ABNORMAL HIGH (ref 70–99)
Glucose-Capillary: 297 mg/dL — ABNORMAL HIGH (ref 70–99)
Glucose-Capillary: 297 mg/dL — ABNORMAL HIGH (ref 70–99)
Glucose-Capillary: 60 mg/dL — ABNORMAL LOW (ref 70–99)
Glucose-Capillary: 77 mg/dL (ref 70–99)
Glucose-Capillary: 101 mg/dL — ABNORMAL HIGH (ref 70–99)
Glucose-Capillary: 104 mg/dL — ABNORMAL HIGH (ref 70–99)
Glucose-Capillary: 107 mg/dL — ABNORMAL HIGH (ref 70–99)
Glucose-Capillary: 121 mg/dL — ABNORMAL HIGH (ref 70–99)
Glucose-Capillary: 131 mg/dL — ABNORMAL HIGH (ref 70–99)
Glucose-Capillary: 136 mg/dL — ABNORMAL HIGH (ref 70–99)
Glucose-Capillary: 140 mg/dL — ABNORMAL HIGH (ref 70–99)
Glucose-Capillary: 165 mg/dL — ABNORMAL HIGH (ref 70–99)
Glucose-Capillary: 197 mg/dL — ABNORMAL HIGH (ref 70–99)
Glucose-Capillary: 214 mg/dL — ABNORMAL HIGH (ref 70–99)
Glucose-Capillary: 260 mg/dL — ABNORMAL HIGH (ref 70–99)
Glucose-Capillary: 263 mg/dL — ABNORMAL HIGH (ref 70–99)
Glucose-Capillary: 279 mg/dL — ABNORMAL HIGH (ref 70–99)
Glucose-Capillary: 44 mg/dL — ABNORMAL LOW (ref 70–99)
Glucose-Capillary: 69 mg/dL — ABNORMAL LOW (ref 70–99)

## 2010-12-24 LAB — CROSSMATCH
ABO/RH(D): A POS
Antibody Screen: NEGATIVE

## 2010-12-24 LAB — COMPREHENSIVE METABOLIC PANEL
ALT: 81 U/L — ABNORMAL HIGH (ref 0–35)
AST: 133 U/L — ABNORMAL HIGH (ref 0–37)
Alkaline Phosphatase: 92 U/L (ref 39–117)
Alkaline Phosphatase: 65 U/L (ref 39–117)
BUN: 18 mg/dL (ref 6–23)
CO2: 29 mEq/L (ref 19–32)
Calcium: 8.6 mg/dL (ref 8.4–10.5)
Chloride: 94 mEq/L — ABNORMAL LOW (ref 96–112)
GFR calc Af Amer: 47 mL/min — ABNORMAL LOW (ref >60)
GFR calc non Af Amer: 39 mL/min — ABNORMAL LOW (ref >60)
GFR calc non Af Amer: 34 mL/min — ABNORMAL LOW (ref >60)
Glucose, Bld: 60 mg/dL — ABNORMAL LOW (ref 70–99)
Potassium: 5.3 mEq/L — ABNORMAL HIGH (ref 3.5–5.1)
Potassium: 4.2 mEq/L (ref 3.5–5.1)
Sodium: 130 mEq/L — ABNORMAL LOW (ref 135–145)
Total Protein: 5.8 g/dL — ABNORMAL LOW (ref 6.0–8.3)

## 2010-12-24 LAB — URINE MICROSCOPIC-ADD ON

## 2010-12-24 LAB — URINALYSIS, ROUTINE W REFLEX MICROSCOPIC
Bilirubin Urine: NEGATIVE
Glucose, UA: NEGATIVE mg/dL
Hgb urine dipstick: NEGATIVE
Ketones, ur: NEGATIVE mg/dL
Ketones, ur: NEGATIVE mg/dL
Nitrite: NEGATIVE
Nitrite: NEGATIVE
Protein, ur: NEGATIVE mg/dL
Urobilinogen, UA: 0.2 mg/dL (ref 0.0–1.0)
Urobilinogen, UA: 0.2 mg/dL (ref 0.0–1.0)

## 2010-12-24 LAB — CULTURE, BLOOD (ROUTINE X 2)
Culture: NO GROWTH
Culture: NO GROWTH
Culture: NO GROWTH
Culture: NO GROWTH

## 2010-12-24 LAB — RETICULOCYTES
RBC.: 2.95 MIL/uL — ABNORMAL LOW (ref 3.87–5.11)
Retic Ct Pct: 1 % (ref 0.4–3.1)

## 2010-12-24 LAB — PROTIME-INR: INR: 1.3 (ref 0.00–1.49)

## 2010-12-24 LAB — AMYLASE: Amylase: 28 U/L (ref 27–131)

## 2010-12-24 LAB — LIPASE, BLOOD: Lipase: 35 U/L (ref 11–59)

## 2010-12-24 LAB — VITAMIN B12: Vitamin B-12: >2000 pg/mL — ABNORMAL HIGH (ref 211–911)

## 2010-12-24 LAB — VANCOMYCIN, TROUGH: Vancomycin Tr: 16.7 ug/mL (ref 10.0–20.0)

## 2010-12-24 LAB — ABO/RH: ABO/RH(D): A POS

## 2010-12-24 LAB — T4, FREE: Free T4: 1.05 ng/dL (ref 0.89–1.80)

## 2011-01-06 ENCOUNTER — Telehealth: Payer: Self-pay | Admitting: *Deleted

## 2011-01-06 NOTE — Telephone Encounter (Signed)
Called pt's dgt per request made during walk-in on 01/03/11 at 4:40pm.   Pt currently has compression stockings that come completely up the leg. They attach at the top of the waist with velcro. The pt's dgt states these are coming apart and she needs some new ones. She would like to know if the pt can have "thigh-high" stockings like would be worn with a garter instead of the full stockings. She states she was told by the person who orders her stockings to ask if it was okay to have the thigh-high stockings.

## 2011-01-15 ENCOUNTER — Encounter: Payer: Self-pay | Admitting: Cardiology

## 2011-01-15 ENCOUNTER — Encounter: Payer: Self-pay | Admitting: *Deleted

## 2011-01-15 DIAGNOSIS — N189 Chronic kidney disease, unspecified: Secondary | ICD-10-CM | POA: Insufficient documentation

## 2011-01-15 DIAGNOSIS — G459 Transient cerebral ischemic attack, unspecified: Secondary | ICD-10-CM | POA: Insufficient documentation

## 2011-01-15 DIAGNOSIS — N289 Disorder of kidney and ureter, unspecified: Secondary | ICD-10-CM | POA: Insufficient documentation

## 2011-01-15 DIAGNOSIS — K219 Gastro-esophageal reflux disease without esophagitis: Secondary | ICD-10-CM | POA: Insufficient documentation

## 2011-01-15 DIAGNOSIS — H5440 Blindness, one eye, unspecified eye: Secondary | ICD-10-CM | POA: Insufficient documentation

## 2011-01-15 DIAGNOSIS — G450 Vertebro-basilar artery syndrome: Secondary | ICD-10-CM | POA: Insufficient documentation

## 2011-01-15 DIAGNOSIS — I472 Ventricular tachycardia: Secondary | ICD-10-CM | POA: Insufficient documentation

## 2011-01-15 DIAGNOSIS — J9690 Respiratory failure, unspecified, unspecified whether with hypoxia or hypercapnia: Secondary | ICD-10-CM | POA: Insufficient documentation

## 2011-01-15 DIAGNOSIS — E039 Hypothyroidism, unspecified: Secondary | ICD-10-CM | POA: Insufficient documentation

## 2011-01-15 DIAGNOSIS — I739 Peripheral vascular disease, unspecified: Secondary | ICD-10-CM | POA: Insufficient documentation

## 2011-01-15 DIAGNOSIS — I10 Essential (primary) hypertension: Secondary | ICD-10-CM | POA: Insufficient documentation

## 2011-01-15 DIAGNOSIS — R269 Unspecified abnormalities of gait and mobility: Secondary | ICD-10-CM | POA: Insufficient documentation

## 2011-01-15 DIAGNOSIS — I951 Orthostatic hypotension: Secondary | ICD-10-CM | POA: Insufficient documentation

## 2011-01-15 DIAGNOSIS — E785 Hyperlipidemia, unspecified: Secondary | ICD-10-CM | POA: Insufficient documentation

## 2011-01-15 DIAGNOSIS — E875 Hyperkalemia: Secondary | ICD-10-CM | POA: Insufficient documentation

## 2011-01-15 DIAGNOSIS — F0390 Unspecified dementia without behavioral disturbance: Secondary | ICD-10-CM | POA: Insufficient documentation

## 2011-01-15 DIAGNOSIS — Z7901 Long term (current) use of anticoagulants: Secondary | ICD-10-CM | POA: Insufficient documentation

## 2011-01-15 DIAGNOSIS — E119 Type 2 diabetes mellitus without complications: Secondary | ICD-10-CM | POA: Insufficient documentation

## 2011-01-15 DIAGNOSIS — Z950 Presence of cardiac pacemaker: Secondary | ICD-10-CM | POA: Insufficient documentation

## 2011-01-16 ENCOUNTER — Ambulatory Visit (INDEPENDENT_AMBULATORY_CARE_PROVIDER_SITE_OTHER): Payer: MEDICARE | Admitting: Cardiology

## 2011-01-16 ENCOUNTER — Encounter: Payer: Self-pay | Admitting: Cardiology

## 2011-01-16 DIAGNOSIS — I951 Orthostatic hypotension: Secondary | ICD-10-CM

## 2011-01-16 DIAGNOSIS — I4891 Unspecified atrial fibrillation: Secondary | ICD-10-CM

## 2011-01-16 NOTE — Progress Notes (Signed)
HPI Patient is seen today for follow up of atrial fibrillation.  I saw her last in the office November 12, 2010.  She is actually doing very well.  In February, 2012 she had been hospitalized.  Her meds have been adjusted.  She is doing well.  Her rhythm appears to be remaining stable.  Her weight is remaining stable. Allergies  Allergen Reactions  . Morphine Sulfate     REACTION: jerking  . Oxycodone Hcl     Current Outpatient Prescriptions  Medication Sig Dispense Refill  . acetaminophen (TYLENOL) 500 MG tablet Take 500 mg by mouth every 6 (six) hours as needed.        . Ascorbic Acid (VITAMIN C) 1000 MG tablet Take 1,000 mg by mouth daily.        [name] aspirin 81 MG tablet Take 162 mg by mouth daily.        . furosemide (LASIX) 40 MG tablet Take 40 mg by mouth daily.        . insulin aspart (NOVOLOG) 100 UNIT/ML injection Inject into the skin as directed. Per sliding scale       . insulin glargine (LANTUS) 100 UNIT/ML injection Inject 15 Units into the skin at bedtime.        [name] levothyroxine (SYNTHROID, LEVOTHROID) 75 MCG tablet Take 75 mcg by mouth daily.        . midodrine (PROAMATINE) 5 MG tablet Take 1 tablet (5 mg total) by mouth 2 (two) times daily.  60 tablet  4  . nitroGLYCERIN (NITROSTAT) 0.4 MG SL tablet Place 0.4 mg under the tongue every 5 (five) minutes as needed.        . pyridOXINE (VITAMIN B-6) 100 MG tablet Take 100 mg by mouth daily.        . sertraline (ZOLOFT) 100 MG tablet Take 100 mg by mouth daily.        . travoprost, benzalkonium, (TRAVATAN) 0.004 % ophthalmic solution Place 1 drop into both eyes at bedtime.        [name] DISCONTD: glipiZIDE (GLUCOTROL) 5 MG 24 hr tablet Take 5 mg by mouth daily.          History   Social History  . Marital Status: Widowed    Spouse Name: N/A    Number of Children: 2  . Years of Education: N/A   Occupational History  . RETIRED    Social History Main Topics  . Smoking status: Never Smoker   . Smokeless tobacco: Not on file  .  Alcohol Use: No  . Drug Use: No  . Sexually Active: Not on file   Other Topics Concern  . Not on file   Social History Narrative  . No narrative on file    Family History  Problem Relation Age of Onset  . Cancer Brother     colon cancer    Past Medical History  Diagnosis Date  . Hypertension   . Dyslipidemia   . CAD (coronary artery disease)     Non-STEMI August, 2010,,,, 40% left main, 30% LAD, 60% OM 2, 40% RCA, 90% OM1-culprit lesion-too small for intervention           . Ejection fraction     EF 55% ,2011 / EF 30% with tachycardia cardiomyopathy / EF 50% January, 2012  . Atrial fibrillation   . Pacemaker     [employer] Jude, September, 2010  . Diabetes mellitus   . Dementia     ?? Early dementia ??  2012  . GERD (gastroesophageal reflux disease)   . TIA (transient ischemic attack)     History of TIAs  . Hypothyroidism   . Carotid artery disease     Carotid endarterectomy, right  . PAD (peripheral artery disease)     Stents right leg  Dr. Edilia Bo  . Amputation     Right midfoot 2010  . CKD (chronic kidney disease)     Creatinine 1.4 hospital discharge January 2 012  . Gait disorder   . Vertebrobasilar insufficiency   . Depression   . Warfarin anticoagulation     Started August, 2010  /  stopped January, 2012      with orthostasis and syncope   /  consider restart  . Hyperkalemia     ACE Inhibitor held  . Acute renal insufficiency     Catheterization August 2 010  . Blindness of right eye   . Respiratory failure     Hypoxic and hypercapnic made, 2011, etiology pneumonia  . Leukocytosis     Persistent post pneumonia  . Orthostatic hypotension     Syncope January 2 012 with facial trauma  . Ventricular tachycardia     20 beats December, 2012, asymptomatic    Past Surgical History  Procedure Date  . Total abdominal hysterectomy   . Cholecystectomy   . Carotid endarterectomy   . Amputation 2010    RIGHT MIDFOOT  . Tonsillectomy   . Eye surgeries   .  Pacemaker insertion     SJM Pacemaker implant 9/10  . Pci stent to the right peroneal and right superficial as well as right popliteal arteries     Dr. Edilia Bo    ROS  Patient denies fever, chills, headache, sweats, rash, change in vision, change in hearing, chest pain, cough, nausea vomiting, urinary symptoms.  All other systems are reviewed and are negative.  PHYSICAL EXAM Patient is oriented to person time and place.  She is here with her daughter.  Affect is normal.  Head is atraumatic.  Lungs are clear.  Respiratory effort is not labored.  There's no jugular venous distention.  Cardiac exam reveals a regular rhythm.  No significant murmurs.  The abdomen is soft.  There is no significant peripheral edema. Filed Vitals:   01/16/11 1349  BP: 169/74  Pulse: 63  Height: 5\' 7"  (1.702 m)  Weight: 164 lb (74.39 kg)    EKG  EKG is done and reveals sinus rhythm with a paced rhythm.  ASSESSMENT & PLAN

## 2011-01-16 NOTE — Assessment & Plan Note (Signed)
Fortunately she continues to hold sinus rhythm.  We have held Coumadin since her fall.  Once again we discussed the pros and cons of Coumadin.  I have decided today that if she remains stable without any signs of syncope or presyncope but we will consider Coumadin at her next visit.

## 2011-01-16 NOTE — Telephone Encounter (Signed)
Addressed with pt at 5/10 office visit.

## 2011-01-16 NOTE — Assessment & Plan Note (Signed)
Her orthostatic symptoms are well controlled at this point with medications and support hose.  These will be continued for now.  As we get further from her amiodarone being stopped we may be able to cut back some of the treatments needed for orthostasis.

## 2011-01-16 NOTE — Patient Instructions (Signed)
Follow up as scheduled. Your physician recommends that you continue on your current medications as directed. Please refer to the Current Medication list given to you today. 

## 2011-01-21 NOTE — Assessment & Plan Note (Signed)
[facility]                          [facility] OFFICE NOTE   BRIEA, MCENERY                       MRN:          [id number]  DATE:04/18/2008                            DOB:          1932-07-21    CARDIOLOGIST:  [name], MD, [facility]   PRIMARY CARE PHYSICIAN:  [name], MD   REASON FOR VISIT:  Post-hospitalization followup.   HISTORY OF PRESENT ILLNESS:  Ms. Brittney [name] is a 77 year old female patient  with a history of mild-to-moderate nonobstructive coronary disease by  catheterization in 2005, who also has significant peripheral vascular  disease status post left renal artery stenting and status post right  carotid endarterectomy as well as a history of stroke who presented to  [facility] on March 22, 2008, with tachy palpitations.  She was  noted to be in atrial fibrillation with rapid ventricular rate.  Her  heart beat was rated at 160 beats per minute.  She was treated with IV  diltiazem x1 dose and converted to normal sinus rhythm.  She was seen by  Dr. Myrtis Ser in the hospital who noted that her heart rate was low in sinus  rhythm.  He considered the addition of diltiazem, but decided against  this secondary to her relative bradycardia.  The plan was to follow up  and treat her with rate-controlling medications if her atrial  fibrillation recurred and follow her to see if she would need permanent  pacemaker implantation.  Initial plans were for the patient to follow up  with Dr. Domingo Sep in [place] who was her prior cardiologist.  However, the patient requested that she be transferred to Dr. Myrtis Ser in  [place].   The patient presents for followup today.  She notes occasional  palpitations.  Her daughter notes that she can actually see a pulsatile  mass in the patient's neck when she is having these.  The patient  reports chest pain and shortness of breath associated with this.  She  describes some chest pain at other times as  well.  She has noted  decreased exercise tolerance over the last 6 months.  She reports NYHA  class III symptoms and again these symptoms have worsened.  She denies  orthopnea, but occasionally has paroxysmal nocturnal dyspnea.  This is  fairly chronic and has not changed over the years.  She denies  significant pedal edema.  She denies syncope, near syncope.   It should be noted that the patient has a significant history of falls  and is therefore not felt to be a good Coumadin candidate even though  her CHADS2 score is 4.   CURRENT MEDICATIONS:  Lantus insulin 40 units q.h.s., Aspirin 81 mg  daily, Lipitor 40 mg daily, Hydrochlorothiazide 25 mg daily, Vitamin D,  Glipizide 5 mg daily, Lisinopril 40 mg daily, Sertraline 100 mg daily,  Bilberry 30 mg daily, Lutein 20 mg daily.   ALLERGIES:  She has an intolerance to MORPHINE.   PHYSICAL EXAMINATION:  GENERAL:  She is a well-nourished well-developed  elderly female.  VITAL SIGNS:  Blood pressure is 139/73, pulse 77,  and weight 177.2  pounds.  HEENT:  Normal.  NECK:  Without JVD.  CARDIAC:  Normal S1 and S2.  Regular rate and rhythm.  LUNGS:  Clear to auscultation bilaterally.  ABDOMEN:  Soft and nontender.  EXTREMITIES:  Trace edema bilaterally.  NEUROLOGIC:  She is alert and oriented x3.  Cranial II-XII are grossly  intact.   Electrocardiogram reveals sinus rhythm, heart rate is 71, normal axis,  no acute changes.   IMPRESSION:  1. Paroxysmal atrial fibrillation.      a.     Currently maintaining normal sinus rhythm.      b.     CHADS2 score of 4.      c.     The patient is not a suitable candidate for Coumadin       secondary to history of falls.  2. Preserved left ventricular function with an EF of 55-60% by      echocardiogram in July 2009.  3. Mild-to-moderate nonobstructive coronary disease by catheterization      in January 2005 (calcified left main, 30% OM1, 40% proximal LAD,      50% mid RCA).      a.     Recent  history of progressively worsening dyspnea with       exertion as well as chest pain.  4. Peripheral arterial disease.      a.     Status post stenting in left renal artery, June 2006.  5. Cerebrovascular disease status post right carotid endarterectomy.  6. History of cerebrovascular accident.  7. Diabetes mellitus.  8. Hypertension.  9. Dyslipidemia.  10.History of Vertebro-basilar insufficiency.  11.Gastroesophageal reflux disease with a history of esophageal      stricture status post dilatation.   PLAN:  1. [name] returns to the office today for followup.  She actually      was set up for a 48-hour Holter monitor at discharge.  She reported      no symptoms.  Her mean heart rate was 62 and her maximum was 97      with a minimum of 42 in the early morning hours.  She had no      pauses.  She had no atrial fibrillation noted.  There was 1 rare      episode of brief supraventricular tachycardia at 4-5 beats.  I      discussed her case with Dr. Myrtis Ser today and we reviewed her      information.  At this time, we have decided to place her on a very      small dose of diltiazem at 120 mg a day.  We will see her back in      close followup in the next 4 weeks to reassess her symptoms and      decide on the need for further testing.  If she starts to have      symptoms of bradycardia, she may need to follow with monitoring and      possible pacemaker implantation.  2. The patient has had some chest pain.  She has history of coronary      disease as well as significant vascular disease.  She has also had      decreased exercise tolerance.  The patient      will be set up for a low-level adenosine stress Cardiolite.  We      will review this in followup.  3. Again, the patient will follow up in the  next 4 weeks or sooner.      Tereso Newcomer, PA-C  Electronically Signed      [name], MD, [facility]  Electronically Signed   SW/MedQ  DD: 04/18/2008  DT: 04/19/2008  Job #:  [id number]   cc:   [name], M.D.

## 2011-01-21 NOTE — Consult Note (Signed)
Brittney [name], CUTILLO NO.:  [id number]   MEDICAL RECORD NO.:  [id number]          PATIENT TYPE:  INP   LOCATION:  3733                         FACILITY:  [facility]   PHYSICIAN:  Nadara Mustard, MD     DATE OF BIRTH:  [birth year]   DATE OF CONSULTATION:  10/12/2008  DATE OF DISCHARGE:                                 CONSULTATION   HISTORY OF PRESENT ILLNESS:  The patient is a 77 year old woman who is  admitted for ischemic gangrenous changes to the right forefoot.  She has  a history of abscess which has previously been debrided with a second  and third ray amputation by Dr. Pricilla Holm on October 04, 2008.  Cultures  were positive for strep.  She did have a wound VAC placed due to  progressive ischemic changes.  She was admitted and seen in consultation  with Vascular Surgery as well as with Orthopedic consult.  She has had  previous ankle-brachial indices which showed 0.53 on the right and 0.92  on the left.   PAST MEDICAL HISTORY:  1. AFib.  2. Type 2 diabetes.  3. History of CVA.  4. Hypertension.  5. Renal insufficiency.  6. Dementia.   LABORATORY STUDIES:  Albumin of 1.6 with extremely poor nutrition and  elevated white cell count of 17.7.   PHYSICAL EXAMINATION:  She does not have palpable pulses on the right.  There is no adenopathy.  There is no palpable abscess or visible  cellulitis.  She does have gangrenous changes to the great toe.  She is  status post amputation of the second and third rays with a large open  wound in the mid foot with ischemic nonviable tissue at the base of the  wound.  There are no ischemic changes to the fourth or fifth toe.  There  is no cellulitis proximal to the wound.  She has a extremely cavus foot.   ASSESSMENT:  Peripheral vascular disease with gangrenous right forefoot  changes to the great toe with ischemic wound from the second and third  ray amputation of the right foot with extremely poor nutrition with an  albumin of  1.6 with multiple medical problems including diabetes and  dementia.   PLAN:  I will follow up with Vascular Surgery.  She will undergo  arteriogram studies for possible revascularization to the right lower  extremity.  If the patient is a revascularization candidate, she may be  able to heal a mid foot amputation and if she is not a  revascularizations candidate, she most likely would require a  transtibial amputation.  She will require aggressive nutritional support  during her hospital stay.  I will follow up with the patient with her  results from the vascular workup.     Nadara Mustard, MD  Electronically Signed    MVD/MEDQ  D:  10/12/2008  T:  10/12/2008  Job:  [id number]

## 2011-01-21 NOTE — Op Note (Signed)
NAMEELKE, Brittney [name] NO.:  [id number]   MEDICAL RECORD NO.:  [id number]          PATIENT TYPE:  INP   LOCATION:  3733                         FACILITY:  [facility]   PHYSICIAN:  Juleen China IV, MDDATE OF BIRTH:  Feb 06, 1932   DATE OF PROCEDURE:  10/12/2008  DATE OF DISCHARGE:                               OPERATIVE REPORT   PREOPERATIVE DIAGNOSIS:  Right leg nonhealing wound.   POSTOPERATIVE DIAGNOSIS:  Right leg nonhealing wound.   PROCEDURE PERFORMED:  1. Ultrasound-guided access of left common femoral artery.  2. Abdominal aortogram.  3. Right lower extremity runoff.  4. Third order catheterization.  5. Additional order catheterization.  6. Administration of nitroglycerin.  7. Percutaneous transluminal angioplasty, right popliteal artery.  8. Percutaneous transluminal angioplasty, right peroneal artery.  9. Followup angiogram x1.  10.Stent, right peroneal artery.  11.Stent, right superficial femoral artery.  12.Stent, right popliteal artery.  13.Conscious sedation x2 hours from 12:41 to 2:41 with fentanyl and      Versed.   INDICATIONS:  This is a 77 year old female who is status post amputation  of her right second and third toe.  She is unable to heal these wounds.  Ultrasound shows ankle-brachial indices of 0.5.  She comes in today for  angiogram and possible intervention.   PROCEDURE:  The patient was identified in the holding area and taken to  room 8.  She was placed supine on the table.  Bilateral groins were  prepped and draped in a standard sterile fashion.  A time-out was  called.  The left femoral artery was evaluated with ultrasound and found  to be widely patent.  Lidocaine 1% was used for local anesthesia.  The  left femoral artery was accessed under ultrasound guidance with an 18-  gauge needle.  A 0.35 wire was then advanced in retrograde fashion into  the abdominal aorta under fluoroscopic visualization.  A 5-French sheath  was  placed.  Over the wire, an Omni flush catheter was placed at the  level L1 and an abdominal aortogram was obtained.  Next, catheter was  pulled down aortic bifurcation and pelvic angiogram was obtained.  Using  the Omni flush catheter and Bentson wire, the aortic bifurcation was  crossed.  Catheter was placed in the right external iliac artery and  right lower extremity runoff was obtained.   FINDINGS:  Aortogram:  Visualized portions of the suprarenal abdominal  aorta showed minimal disease.  Bilateral renal arteries are patent.  By  report, the patient has a renal artery stent; however, this is not well  visualized on these images.  The infrarenal abdominal aorta shows mild  disease without hemodynamically significant lesions.   Pelvic angiogram:  Bilateral common iliac arteries are widely patent.  Bilateral external iliac arteries are widely patent.  Bilateral  hypogastric arteries are widely patent.   Right lower extremity:  The right common femoral artery is widely patent  with mild disease.  The right profunda femoral artery is widely patent  with mild disease.  Right superficial femoral artery is patent.  There  are multiple areas  of stenoses throughout the superficial femoral  artery.  The popliteal artery is patent with only mild disease.  The  popliteal artery occludes at its distal extent.  The anterior tibial and  posterior tibial arteries are occluded.  The peroneal artery is  occluded.  However, it reconstitutes approximately 10 cm below its  origin.  The peroneal artery is patent down to the ankle where  collaterals help reconstitute the anterior tibial artery which is the  dominant vessel on the foot.   Intervention:  After above images were obtained, decision was made to  intervene.  Over a Rosen wire, a 7-French Terumo sheath was placed.  At  this point in time, the patient was given systemic heparinization.  Using a Cougar 0.14 wire and a Quick-Cross catheter, the  catheter was  advanced down to the distal popliteal artery.  There was a stump of the  peroneal artery which was visualized.  I used a Berenstein II catheter  to select the stump.  Then with the Cougar wire and Quick-Cross  catheter, subintimal dissection was performed.  Reentry was obtained  using a Confianza ProWire.  The Quick-Cross catheter was advanced into  the patent vessel.  a confirmatory angiogram was performed.  At this  point, the patient received intravenous nitroglycerin.  Next, with the  Confianza ProWire in the distal peroneal artery, primary balloon  angioplasty was performed of the peroneal artery and popliteal artery.  A 3 x 20 Sleek balloon was utilized.  The balloon was taken to 10  atmospheres and held up for 2 minutes.  Followup arteriogram reveals a  now patent peroneal artery.  However, at a point 2 cm distal to the  origin of the peroneal artery, there was a focal irregularity which I  felt was best primarily stented.  An Xpert 4 x 30 self-expanding stent  was advanced across this lesion and then deployed.  It was molded to  confirmation with a 3-mm balloon.  Followup arteriogram reveals widely  patent peroneal artery.   At this point, I was happy with results below the knee.  I felt that we  needed to address the patient's femoral and popliteal disease.  I felt  this was best treated with primary stenting.  I selected a Cordis 7 x  150 self-expanding stent which was deployed in the proximal popliteal  artery extending back into the superficial femoral artery.  A second 7 x  60 Cordis self-expanding stent was deployed with 1 cm overlap landing  into the proximal superficial femoral artery.  The stents were then  molded to confirmation using a 6-mm balloon.  Follow up study was then  performed which reveals widely patent femoral and popliteal vessels.  There is mild irregularity of the popliteal artery; however, this does  not appear to be hemodynamically  significant.  There is single-vessel  runoff via the peroneal artery which is widely patent.  Collaterals  reconstitute at the dorsalis pedis on the foot.  At this point, a  decision was made to terminate the procedure.  Catheters and wires were  removed.  The sheath was pulled back into the left external iliac artery  and the patient will be taken to holding area for sheath pull once her  coagulation profile corrects as she was heparinized for the procedure.   IMPRESSION:  1. Occluded posterior tibial, anterior tibial, and peroneal arteries.      There is reconstitution of the peroneal artery 10 cm distal to its  origin.  Subintimal dissection was performed with subsequent      balloon angioplasty and stenting which resulted in a widely patent      peroneal artery.  A 3-mm balloon and a 4-mm stent were deployed in      the peroneal artery.  2. Diffuse femoral and popliteal disease.  The superficial femoral and      proximal popliteal artery were successfully treated using 7-mm      Cordis self-expanding stent.          ______________________________  V. Charlena Cross, MD  Electronically Signed    VWB/MEDQ  D:  10/12/2008  T:  10/13/2008  Job:  [id number]

## 2011-01-21 NOTE — H&P (Signed)
NAMELAVINE, HARGROVE NO.:  [id number]   MEDICAL RECORD NO.:  [id number]          PATIENT TYPE:  IPS   LOCATION:  4005                         FACILITY:  [facility]   PHYSICIAN:  Ranelle Oyster, M.D.DATE OF BIRTH:  08/22/1932   DATE OF ADMISSION:  10/24/2008  DATE OF DISCHARGE:                              HISTORY & PHYSICAL   PRIMARY CARE PHYSICIAN:  [name], MD   CHIEF COMPLAINT:  Right foot pain.   HISTORY OF PRESENT ILLNESS:  This is a pleasant 77 year old white female  with diabetes and right foot abscess with osteomyelitis requiring I and  D in right second toe and metatarsal head amputation on October 04, 2008  at [facility].  After the procedure, the patient had persistent  fever and leukocytosis.  She developed atrial flutter and this required  transfer to [facility] for workup.  She was treated with Cardizem and  Coumadin was not used due to fall risk.  Dr. Myra Gianotti was consulted for  input regarding the patient's right foot peroneal artery and SFA.  Stent  was placed on October 12, 2008.  Dr. Lajoyce Corners was consulted for input  regarding her nonhealing wound.  The patient was developing gangrenous  changes along the right great toe and underwent a right midfoot  amputation on October 18, 2008.  Vacuum was kept in place until  October 21, 2008.  The patient has been on IV Zosyn and vancomycin  since admission and on October 19, 2008, the patient developed a  temperature of 101 and Cipro and Diflucan were added.  The patient with  acute blood loss anemia with hemoglobin 7.8.  The patient was transfused  2 units of packed red blood cells.  She has also had blood cultures done  which were pending.  Urine culture has been negative.  Cipro was stopped  on October 21, 2008.  She remained on IV Zosyn and vancomycin with  unknown duration at this point.  The patient has continued to have  problems with pain as well as difficulty maintaining her  nonweightbearing status.  Rehab was consulted on October 20, 2008.  Pending vacuum issues, it was felt that she could benefit from inpatient  rehab admission.  Therefore, she was brought today as vacuum had been  stopped and she was showing therapy tolerance.   REVIEW OF SYSTEMS:  Notable for weakness and wound care issues.  She had  decreased hearing and occasional dizziness.  She denies any shortness of  breath, chest pain, etc.  Full review is in the written H and P.   PAST MEDICAL HISTORY:  1. Positive for type 2 diabetes.  2. CAD.  3. Atrial fibrillation.  4. Dyslipidemia.  5. Bilateral carpal tunnel release.  6. Chronic renal insufficiency.  7. 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  8. Esophageal stricture status post dilatation.  9. Hypothyroidism.  10.Anemia of chronic disease.  11.PVD.  12.Gait disorder.  13.Right CEA.  14.Cholecystectomy.  15.Hysterectomy.  16.Vertebrobasilar insufficiency.  17.Depression.   FAMILY HISTORY:  Positive for diabetes.   SOCIAL HISTORY:  The  patient is married and lives in a one-level house  with a ramp at entry.  She is a retired Lawyer.  She does not smoke or  drink.   ALLERGIES:  MORPHINE.   HOME MEDICATIONS:  Aspirin, Travatan drops, Zoloft, metoprolol, iron  sulfate, Protonix, Lipitor, Lantus insulin, Dilaudid, Zofran,  lisinopril, glipizide, vitamin D and B6.   LABORATORY DATA:  Hemoglobin 10.3, white count 11.1, and platelets 393.  Sodium 137, potassium 4.7, BUN 11, and creatinine 1.3.   PHYSICAL EXAMINATION:  VITAL SIGNS:  Blood pressure is 162/62, pulse is  57, temperature 98.3, respiratory rate 22, weight 77 kg, and height 67  inches.  GENERAL:  The patient is sitting in bed, is pleasant, alert, and  oriented x3.  EYES:  Pupils equal, round, and reactive to light.  EARS, NOSE, AND THROAT:  Complete dentures.  Mucosa is pink and moist.  NECK:  Supple without JVD or lymphadenopathy.  CHEST:  Clear to auscultation  bilaterally without wheezes, rales, or  rhonchi.  HEART:  Regular rate and rhythm without murmurs, rubs, or gallops.  ABDOMEN:  Soft and nontender.  Bowel sounds are positive.  She has an  ecchymotic area secondary to heparin injections.  SKIN:  Intact less to right foot which was just newly dressed and exam  was deferred there for today.  NEUROLOGIC:  Cranial nerves II-XII are notable for decreased hearing and  poor vision of the right eye.  Reflexes are 1+.  The patient denied  sensory loss in the distal limbs.  Motor function was generally 4-5/5 in  the upper extremity.  Right lower extremity strength was 3+/5 proximal  at the hip, 4/5 at the knee, and ankle movement was free at 3-4/5.  I  did not put pressure on the foot due to wound.  Left lower extremity was  3-4/5 proximally to 4/5 distally.  Judgment was good as well as  orientation, memory, and mood today.   POST ADMISSION PHYSICIAN EVALUATION:  1. Functional deficits secondary to right midfoot amputation due to      osteomyelitis and gangrene.  Course complicated by atrial flutter,      fever, etc.  2. The patient was admitted to receive collaborative interdisciplinary      care between the physiatrist, rehab nursing staff, and therapy      team.  3. The patient's level of medical complexity and substantial therapy      needs in context of that medical necessity cannot be provided at a      lesser intensity of care.  4. The patient has experienced substantial functional loss from her      baseline.  Prior to arrival, she was independent at home for ADLs.      As of rehab consultation on October 20, 2008, the patient was      requiring min assist with bed mobility, min to mod with transfers,      and gait had not been tested as of yet.  Within the last 24 hours,      the patient's functional status was as follows:  Min assist      transfer, min assist ambulation at 5 feet x2 set up for upper body      care, and mod assist  for lower body care.  Judging by the patient's      diagnosis, physical exam, and functional history, the patient has      potential for functional progress which will result in measurable  gains while in inpatient rehab.  These gains will be of substantial      and practical use upon discharge to home where she will have      assistance from her daughter.  Interim changes in the medical      status since the initial rehab consult are documented above.  5. Physiatrist will provide 24-hour management of medical needs as      well as oversight of therapy plans/treatment and provide guidance      as appropriate regarding interaction of the two.  Medical problem      list and plan are listed below.  6. A 24-hour rehab nursing will assist in management of the patient's      skin/wound care needs as well as nutrition, bowel and bladder      management, safety, appropriate medication administration,      nutrition, etc.  They also will help integrate therapy concepts,      techniques, etc.  7. PT will assess and treat for lower extremity strength, observance      of nonweightbearing status, adaptive equipment, appropriate      technique with functional mobility and gait with goals at modified      independent level for short distances.  8. OT will assess and treat for upper extremity use and ADLs as well      as appropriate adaptive equipment, safety issues, and family      education as indicated.  Goals are modified independent to      supervision.  9. Case management/social worker will assess and treat for      psychosocial issues and discharge planning.  10.Team conferences will be held weekly to assess progress towards      goals and to determine barriers to discharge.  11.The patient has demonstrated sufficient medical stability and      exercise capacity to tolerate at least 3 hours of therapy per day      at least 5 days per week.  12.Estimated length of stay is 7 days and  prognosis is good.   MEDICAL PROBLEM LIST AND PLAN:  1. Type 2 diabetes:  Monitor blood sugars before meals and at bedtime.      Resume Lantus insulin.  Cover with sliding scale insulin as      appropriate.  2. Hypertension:  The patient continues to have labile blood pressure.      Continue with Prinivil as well as Lopressor, Norvasc for now.      Observe blood for fluctuation in the mornings and with activity and      when out of bed.  Adjust as appropriate.  3. Acute on chronic renal insufficiency:  Renal status is improving.      We will check admission BUN and creatinine in the morning.  4. Anemia:  This is due to acute blood loss anemia.  Add iron      supplement and follow the blood count serially.  The patient still      has some active bleeding from the wound.  5. Deep venous thrombosis prophylaxis:  Continue subcu heparin      watching closely for abdominal hematomas.  6. Infectious disease:  Continue vancomycin and Zosyn for now.  Also,      the patient is on Diflucan 100 mg p.o. daily.  Discuss with Surgery      regarding duration of treatment.      Ranelle Oyster, M.D.  Electronically Signed  ZTS/MEDQ  D:  10/24/2008  T:  10/25/2008  Job:  [id number]

## 2011-01-21 NOTE — Cardiovascular Report (Signed)
NAMEMALA, GIBBARD NO.:  [id number]   MEDICAL RECORD NO.:  [id number]          PATIENT TYPE:  INP   LOCATION:  2925                         FACILITY:  [facility]   PHYSICIAN:  Bevelyn Buckles. Bensimhon, MDDATE OF BIRTH:  1932/03/11   DATE OF PROCEDURE:  04/30/2009  DATE OF DISCHARGE:                            CARDIAC CATHETERIZATION   PATIENT IDENTIFICATION:  Brittney [name] is a 77 year old woman with atrial  fibrillation and atrial flutter who has had troubles with rate control  despite IV diltiazem and amiodarone.  She was seen by Dr. Shirlee Latch and TEE  cardioversion was arranged.   We  performed TEE which showed an EF of approximately 30% which I  suspect is due to a tachycardiac cardiomyopathy.  There was no left  atrial appendage clot.   After further sedation by anesthesia with Dr. Sondra Come, she underwent 150-J  biphasic synchronized shock which converted her to sinus bradycardia in  the 40s.  There were no apparent complications.      Bevelyn Buckles. Bensimhon, MD  Electronically Signed     DRB/MEDQ  D:  04/30/2009  T:  04/30/2009  Job:  [id number]

## 2011-01-21 NOTE — Discharge Summary (Signed)
NAMETRAVONNA, SWINDLE NO.:  [id number]   MEDICAL RECORD NO.:  [id number]          PATIENT TYPE:  INP   LOCATION:  3733                         FACILITY:  [facility]   PHYSICIAN:  Ruthy Dick, MD    DATE OF BIRTH:  1932/07/21   DATE OF ADMISSION:  10/10/2008  DATE OF DISCHARGE:                               DISCHARGE SUMMARY   ANTICIPATED DATE OF DISCHARGE:  October 23, 2008.   REASON FOR ADMISSION:  Right foot osteomyelitis.   FINAL DISCHARGE DIAGNOSES:  1. Right foot osteomyelitis, status post right midfoot amputation on      October 18, 2008, by Dr. Lajoyce Corners.  2. Peripheral vascular disease, status post revascularization      procedure this admission.  3. Leukocytosis.  4. Atrial fibrillation.  5. Hypertension.  6. Hyperlipidemia.  7. Diabetes mellitus type 2.  8. Acute on chronic renal insufficiency.  9. Anemia of chronic disease.  10.Urosepsis.  11.Funguria.   CONSULT DURING THIS ADMISSION:  Vascular surgical consult and orthopedic  consult.   PROCEDURES DONE DURING THIS ADMISSION:  1. Right midfoot amputation by Dr. Lajoyce Corners, October 18, 2008.  2. PTCA of the right popliteal artery, PTCA of the right peroneal      artery with stent placement in both the superficial femoral artery      and right popliteal artery with also stent placed in the right      peroneal artery as well.   BRIEF HISTORY OF PRESENTING ILLNESS AND HOSPITAL COURSE:  This is a 77-year-old patient with multiple comorbidities including diabetes  mellitus, peripheral vascular disease, hyperlipidemia, hypertension, and  renal insufficiency who came into the hospital with worsening  osteomyelitis of the right foot.  She was initially seen at [facility] on September 28, 2008, for right foot ulcer.  Over there, she had  a right second toe and right second metatarsal head amputation done.  The infection did not really heal well, and the patient she was not  discharged from [facility]  but was transferred from [facility] to [facility] for evaluation by vascular surgeon, seen here by the vascular  surgeon, and the patient underwent PTCA as noted above with about 3  stents placed.  Good pulses returned, but the patient continued to have  a nonhealing wound on the right foot, and because of this, the patient  underwent a right midfoot amputation.  MRI was done here at [facility],  which showed the patient had osteomyelitis.  She has chronic anemia, and  during this admission H&H dropped below 8, and she has been transfused 2  units of packed red blood cells.  Workup of her fever during this  admission had yielded mild urosepsis and funguria, because of which  fluconazole was added to her regimen.  She has done well since then in  the hospital, and wound VAC has been discontinued, and she is deemed  eligible to go to inpatient rehab facility.  She has since been accepted  by the inpatient rehab facility, and if insurance pans out, she should  be able  to be transferred today or tomorrow.  Today, the patient has no  complaints whatsoever.  She is alert and oriented x3.  Denies chest  pain, shortness of breath, abdominal pain, nausea, vomiting, diarrhea,  or constipation.  Outpatient followup plan is to be arranged by the  rehab facility.  She will be followed by the rehab physicians while over  there.   PHYSICAL EXAMINATION:  VITAL SIGNS:  Her vitals today shows temp of  98.6, pulse 50, respirations 18, blood pressure 146/56, saturating 94%  on 2 L.  CHEST:  Clear to auscultation bilaterally.  ABDOMEN:  Soft and nontender.  EXTREMITIES:  No clubbing, no cyanosis, no edema.  The patient has a  dressing on her right foot where she had the right midfoot amputation  done.  CARDIOVASCULAR:  Irregularly irregular rhythm.  CENTRAL NERVOUS SYSTEM:  Nonfocal.   MEDICATIONS:  She is to go home on the following medications:  1. Lisinopril 40 mg daily.  2. Vitamin D 400 mg  daily.  3. Glipizide 5 mg daily.  4. Travatan eye drops, one drop in each eye daily.  5. Lantus 40 units subcutaneously.  6. Zosyn 3.375 g IV q.8 h. and vancomycin 750 mg IV daily, pharmacist      to manage both Zosyn and vancomycin if possible.  7. Protonix 40 mg p.o. daily.  8. Norvasc 5 mg p.o. daily.  9. Synthroid 75 mcg p.o. daily.  10.Dilaudid 1 mg as needed.  11.Zoloft 50 mg daily.  12.Metoprolol 50 mg b.i.d.  13.Iron sulfate 325 mg daily.  14.Multivitamins 1 tablet daily.  15.Nystatin powder between toes twice daily.  16.Aspirin 325 mg daily.  17.Lipitor 40 mg daily.  18.Zofran 4 mg as needed for nausea.  19.Senokot-S 2 tablets as needed.  20.Plavix 75 mg p.o. daily.   Orthopedic Surgery to decide the stop date for both the vancomycin and  Zosyn.   Time used for transfer planning greater than 30 minutes.      Ruthy Dick, MD  Electronically Signed     GU/MEDQ  D:  10/23/2008  T:  10/24/2008  Job:  [id number]

## 2011-01-21 NOTE — Group Therapy Note (Signed)
Brittney [name], POTTENGER NO.:  [id number]   MEDICAL RECORD NO.:  [id number]          PATIENT TYPE:  INP   LOCATION:  3733                         FACILITY:  [facility]   PHYSICIAN:  Peggye Pitt, M.D. DATE OF BIRTH:  October 18, 1931                                 PROGRESS NOTE   DATE OF ADMISSION:  October 10, 2008.   DATE OF DISCHARGE:  Is still to be determined.   DIAGNOSES:  Up to date, include:  1. A right foot osteomyelitis, for right midfoot amputation on      February 10th with Dr. Lajoyce Corners.  2. Peripheral vascular disease status post revascularization      procedure.  3. Leukocytosis secondary to number 1.  4. Atrial fibrillation.  5. Hypertension.  6. Hyperlipidemia.  7. Diabetes.  8. Acute on chronic renal insufficiency.  9. Anemia of chronic disease.   MEDICATIONS:  Up to date, include:  1. Aspirin 325 mg daily.  2. Vitamin D 400 units daily.  3. Plavix 75 mg daily.  4. Ferrous sulfate 325 mg daily.  5. Glipizide 5 mg daily.  6. Subcu heparin 5000 units every 8 hours.  7. Sliding scale insulin.  8. Lantus 25 units at bedtime.  9. Synthroid 50 mcg daily.  10.Lisinopril 40 mg daily.  11.Lopressor 50 mg twice daily.  12.Norvasc 5 mg daily.  13.Multivitamin 1 tablet daily.  14.Protonix 40 mg daily.  15.Zosyn 3.375 grams IV every 8 hours.  16.Vancomycin as per pharmacy.  17.Crestor 20 mg at bedtime.  18.Also a variety of p.r.n. pain medications.   HOSPITAL COURSE:  Up to date.  Brittney [name] is a 77 year old Caucasian woman initially  admitted on October 10, 2008.  She initially presented to [facility] on January 21st for a right foot ulcer with fevers up to 103  degrees.  There podiatrist, Dr. Emilio Math, was consulted and  performed a right second toe and right second metatarsal head amputation  on January 27th following an MRI that showed an extensive abscess with  gas and osteomyelitis.  She was subsequently started on vancomycin and  cefepime.  Subsequently, the family has requested that she be  transferred to [facility] for evaluation by a vascular surgeon.  Dr. Colin Benton has agreed to see the patient in consultation and, hence, the  patient is transferred to our service for further evaluation and  management.  While here in the hospital, she has had a revascularization  procedure performed by Dr. Myra Gianotti.  She had a PTCA of the right  popliteal and right peroneal artery as well as a stent of the right  peroneal artery, a stent of the right superficial femoral artery and a  stent of the right popliteal artery.  Subsequently, she has developed  good pulses.  However, she continues to have gangrenous ischemic digits,  especially of her right big toe as well as the wound base from a prior  amputation, subsequently Dr. Lajoyce Corners, with the orthopedics, was consulted  who has decided to perform a right midfoot amputation and that has been  scheduled for tomorrow.  In the meantime, the patient remains on  vancomycin and Zosyn.  An [facility] has been performed [facility] that  indeed shows evidence for osteomyelitis.  Following her amputation, we  will make a decision in regards to disposition with assistance of  physical therapy to decide if she will be okay to go home or if she will  need a nursing facility for short-term rehab.  She also has had some  leukocytosis which is secondary to her osteomyelitis that has been  improving on antibiotics, it is down to 13,400 on day of this dictation.  Also has a history of atrial fibrillation; however, she has been  maintained off Coumadin.  She is rate controlled on Lopressor.  On day  of this dictation, I have added Norvasc 5 mg daily to her  antihypertensive regimen as her systolic blood pressures have been 160-  170 systolic since admission.  For acute and chronic renal  insufficiency, her creatinine remains stable around her baseline of 1.2,  she does have anemia of chronic disease  and on day of discharge her  hemoglobin is 9.8.  I would not transfuse unless she drops below 8 given  her absence of coronary artery disease.   SIGNS ON DAY OF THIS DICTATION:  Blood pressure 173/69, heart rate 61,  respirations 18, O2 sats 97% on 2 liters with a temperature of 99.8.   LABS ON DAY OF THIS DICTATION:  Sodium 33, potassium 5.2, chloride 98, bicarbonate 31, BUN 18,  creatinine 1.24 with a glucose of 212, WBC 13.4, hemoglobin 9.8 and a  platelet count of 482.      Peggye Pitt, M.D.  Electronically Signed     EH/MEDQ  D:  10/17/2008  T:  10/17/2008  Job:  [id number]

## 2011-01-21 NOTE — Cardiovascular Report (Signed)
NAMEROSALITA, CAREY NO.:  [id number]   MEDICAL RECORD NO.:  [id number]          PATIENT TYPE:  INP   LOCATION:  2925                         FACILITY:  [facility]   PHYSICIAN:  Marca Ancona, MD      DATE OF BIRTH:  [birth year]   DATE OF PROCEDURE:  04/25/2009  DATE OF DISCHARGE:                            CARDIAC CATHETERIZATION   PROCEDURES:  1. Left heart catheterization.  2. Coronary angiography.  3. Left ventriculography.   INDICATIONS:  Non-ST-elevation MI.  A 77 year old woman with history of  peripheral arterial disease, nonobstructive coronary artery disease,  atrial fibrillation, and dementia.   PROCEDURE NOTE:  After informed consent was obtained, the right groin  was sterilely prepped and draped with 1% lidocaine using locally  anesthetize right groin area.  The right common femoral artery was  engaged using Seldinger technique and a 5-French arterial sheath was  placed.  The left coronary artery was engaged using the 5-French  multipurpose catheter.  The left ventricle was entered using a 5-French  multipurpose catheter and the right coronary artery, which had a high  anterior takeoff was engaged using the AL-1 catheter.  There are no  complications.   FINDINGS:  1. Hemodynamics, aorta 184/64, LV 182/16.  2. Left ventriculography.  EF was estimated at 55-60%.  There were no      regional wall motion abnormalities.  3. RCA.  The RCA had a high anterior takeoff.  It was engaged using      the AL-1 catheter.  There was about a 40% mid RCA stenosis.  The      RCA was a relatively small nondominant vessel.  4. Left main.  The left main had a 40% ostial calcified lesion.  This      was compared to the cath films from 2005 and was noted to have      minimal progression compared to that time.  5. LAD system.  There was a 30% proximal LAD stenosis otherwise only      luminal irregularities.  6. Left circumflex system.  The circumflex was a large  dominant      vessel.  There is a small high first obtuse marginal that had a 90%      ostial stenosis.  There was a moderate-sized high second obtuse      marginal that had a 50-60% ostial stenosis.  There is minimal      disease in the third obtuse marginal and there is a large left PDA      with luminal irregularities.   ASSESSMENT AND PLAN:  This is a 77-year with history of peripheral  arterial disease, nonobstructive coronary artery disease, atrial  fibrillation, and dementia who presented to [facility] with a  non-ST elevation MI.  Left heart catheterization today shows about a 40%  calcified proximal/ostial left main.  Today's film was compared to the  film from January 2005, and there does not seem to have been significant  progression compared to that time.  Intravascular ultrasound was done in  2005, and showed that the lesion was  indeed nonobstructive.  The patient  does have a 90-95% ostial first obtuse marginal.  This may have been the  culprit vessel for her non-ST-elevation myocardial infarction.  This is  a small vessel and is not an interventional target.  She has a 50-60%  ostial second  obtuse marginal, which is a moderate size vessel and also not a great  interventional target.  The films were discussed with Dr. Charlies Constable  and it was decided that the best plan here will be medical management.  She does definitely need some lowering of her blood pressure.      Marca Ancona, MD  Electronically Signed     DM/MEDQ  D:  04/25/2009  T:  04/26/2009  Job:  [id number]   cc:   [name]. [name], MD, [facility]

## 2011-01-21 NOTE — H&P (Signed)
Brittney [name], Brittney [name] NO.:  [id number]   MEDICAL RECORD NO.:  [id number]          PATIENT TYPE:  INP   LOCATION:  3733                         FACILITY:  [facility]   PHYSICIAN:  Elliot Cousin, M.D.    DATE OF BIRTH:  18-Oct-1931   DATE OF ADMISSION:  10/10/2008  DATE OF DISCHARGE:                              HISTORY & PHYSICAL   PRIMARY CARE PHYSICIAN:  [name], M.D., [place], [place].   PRIMARY CARDIOLOGIST:  [name], MD, [facility].   CHIEF COMPLAINT:  The patient has been transferred from [facility] because of a concern regarding worsening right foot abscess and  vascular compromise.  Vascular surgeon, Dr. Arbie Cookey, has agreed to see the  patient in consultation here at [facility].   HISTORY OF PRESENT ILLNESS:  The patient is a 77 year old woman with a  past medical history significant for coronary artery disease, paroxysmal  atrial fibrillation, type 2 diabetes mellitus, and hypothyroidism.  She  presented to [facility] on September 28, 2008 for a right  foot ulcer associated with fever of 102-103 degrees Fahrenheit.  Apparently, the patient was evaluated in Dr. Geanie Logan office on  September 28, 2008.  When either Dr. [name] or Dr. Sherwood Gambler evaluated the  patient, it was recommended that she go to the hospital for further  evaluation.  The patient was admitted to the hospitalist service at  [facility] on September 28, 2008.  Podiatrist, Dr. Emilio Math, was consulted.  Apparently an MRI of the right foot was ordered  on October 04, 2008, and it revealed extensive abscess with gas inside  the abscess and osteomyelitis.  Prior to Dr. Winferd Humphrey assessment, the  patient had been started on intravenous Levaquin.  Dr. Pricilla Holm proceeded  to perform a right second toe and right second metatarsal head  amputation on October 04, 2008.  Following the operation, the patient  was started on vancomycin and later  Cefepime was added.  Abscess  cultures were ordered and they grew out Streptococcus anginosus,  sensitive to all of the antibiotics that the patient had been receiving.  A Vac wound care device was started under the guidance of Dr. Pricilla Holm.  Despite the amputation and antibiotics, the patient continued to spike  fevers.  Also, the patient's white blood cell count increased from  17,000-23,000 over the past 48 hours.  There was a concern about  worsening abscess and/or vascular compromise of the right foot.  Also  during the hospital course, the patient developed rapid atrial  fibrillation and atrial flutter, which required transfer to the  intensive care unit.  The patient was started on a Cardizem drip and  followed by [facility].  Medical treatment was given by the  cardiology team and the atrial fibrillation/atrial flutter, along with  supraventricular tachycardia resolved.   The family requested that the patient be transferred to [facility] for evaluation by a vascular surgeon.  Podiatrist, Dr. Pricilla Holm  discussed the patient's case with vascular surgeon Dr. Arbie Cookey, who agreed  to see the patient in consultation  if the hospitalist service would  admit the patient.  Therefore, the patient was transferred to [facility] today.   PAST MEDICAL HISTORY:  1. Right foot abscess with diabetic ulcer:  Status post right second      toe and right second metatarsal head amputation on October 04, 2008      by Dr. Onalee Hua B. Pricilla Holm, D.P.M.  Cellulitis and osteomyelitis      present per MRI of the right foot during the hospitalization at      [facility].  Culture grew out Streptococcus anginosus.  2. Worsening leukocytosis:  With a white blood cell count today at      [facility] of 21,000.  3. Known peripheral vascular disease:  ABI today at [facility]      revealed 0.53 on the right with no pressure obtained on the toes on      the right; and left ankle  brachial index of 0.92 with the toe      pressure being 0.29.  4. Status post stenting of the left renal artery June 2006 by Dr. Cristy Hilts. [place].  5. Paroxysmal atrial fibrillation and atrial flutter, not a Coumadin      candidate secondary to history of falls.  6. History of supraventricular tachycardia.  7. Noncritical coronary artery disease by cardiac catheterization in      January 2005 at [facility].  Ejection fraction at that      time was estimated to be 65%.  8. A 2-D echocardiogram during the hospitalization at [facility] last week revealed an ejection fraction of 50-55%, with      mild-to-moderate dilatation of the left atrium.  9. Type 2 diabetes mellitus.  10.History of stroke, status post right carotid endarterectomy.  The      patient also has a history of a stroke in the right eye, with poor      vision since then.  11.Hypertension.  12.Dyslipidemia.  13.History of syncopal episode in 2001.  14.History of vertebrobasilar insufficiency, with compromised      posterior circulation documented by MRI in July 2000.  15.History of recent falls at home.  16.History of esophageal strictures, status post dilatations in the      past.  17.History of bradycardia while on digoxin.  18.Depression.  19.Hypothyroidism.  20.Status post appendectomy.  21.Status post bilateral cataract surgery.  22.Status post cholecystectomy.  23.Status post hysterectomy.  24.Status post removal of benign breast tumor on the right.  25.Status post right carotid endarterectomy 5 or more years ago.  26.Status post carpal tunnel surgery on the right wrist several months      ago.  27.Dementia.   MEDICATIONS:  (Taken from the discharge medications from [facility]).  1. Clindamycin 900 mg IV every 8 hours (just ordered today at [facility]).  2. Zosyn 3.375 grams IV every 6 hours.  (just ordered today at      [facility]).  3. Levaquin 750  mg IV every 48 hours.  4. Vancomycin 750 mg IV every 24 hours.  5. Metoprolol 50 mg b.i.d.  6. Iron sulfate 325 mg daily.  7. Multivitamin once daily.  8. Nystatin powder between the toes b.i.d. for foot fungus.  9. Aspirin 325 mg daily.  10.Protonix 40 mg daily.  11.Lipitor 40 mg in the evening.  12.Synthroid 25 mcg daily.  13.Dilaudid 1 mg every  4 hours as needed for pain.  14.Vicodin 5/500 mg every 6 hours as needed for pain.  15.Zofran 4 mg IV every 4 hours as needed for nausea or vomiting.  16.Senokot S two tablets q.h.s. p.r.n. constipation.  17.Lisinopril 40 mg daily.  18.Vitamin D 400 mg daily.  19.Glipizide 5 mg daily.  20.Travatan eye drops one drop in both eyes daily.  21.Lantus insulin 55 units subcutaneously daily.   ALLERGIES:  The patient has an allergy to MORPHINE.   SOCIAL HISTORY:  [place]  [place]band.  Her husband has lung cancer.  She has depression.  The patient  has 3 children in all; Audrea Muscat, one of her daughters, is the  patient's health care power of attorney (phone number is [phone]).  The patient is retired.  She has no history of tobacco, alcohol and  illicit drug use.   PHYSICAL EXAM:  Capillary blood glucose 107, temperature 99.2, pulse 59,  respiratory rate 18, blood pressure 122/54, oxygen saturation 98% on 2  liters of nasal cannula oxygen.  GENERAL: The patient is a pleasant,  alert, mildly overweight 77 year old Caucasian woman, who is currently  lying in bed in mild distress because of pain of her right foot.  HEENT:  Head is normocephalic and nontraumatic.  Pupils were equal, round and  reactive to light.  Extraocular movements are intact.  Conjunctivae are  clear.  Sclerae are white.  Oropharynx reveals no teeth.  Mucous  membranes are mildly dry.  No posterior exudates or erythema.  NECK:  Supple.  No adenopathy, no thyromegaly, no bruit and no JVD.  LUNGS:  Clear to auscultation with  decreased breath sounds in the bases.  Breathing is nonlabored.  HEART:  S1 and S2 with a soft systolic murmur and occasional ectopic  beat.  ABDOMEN:  Positive bowel sounds; soft, mildly tender in the  hypogastrium without rebound, guarding or distention.  No  hepatosplenomegaly and no masses palpated.  GU: The patient has an  indwelling Foley catheter inserted at [facility].  It is  draining yellow urine.  RECTAL:  Deferred.  EXTREMITIES:  There is a VAC device placed over the operative site on  the right foot, where both the second toe and the second metatarsal  heads have been amputated.  There is surrounding erythema and edema of  the plantar surface of the right foot.  Pedal pulses not palpable on the  left or the right.  No pedal edema on the left.  The patient is barely  able to lift each leg against gravity.  Sensation intact of the lower  extremities bilaterally.  NEUROLOGIC:  The patient is alert and oriented to the hospital, although  she thought it was [facility].  She is not oriented to the year or  the time.  She was able to give the dictating physician the phone number  of her daughter (which she did correctly).  She was able to provide some  of her past medical history and social history.  Cranial nerves II-XII  are grossly intact.   ADMISSION LABORATORIES:  None performed at [facility] as of  yet.  Labs obtained from [facility] today revealed:  White blood  cell count 21.1, hemoglobin 10.1, platelets 485.  Sodium 131, potassium  5.1, chloride 94, CO2 of 28, glucose 95, BUN 51, creatinine 1.40.  Chest  x-ray performed today at [facility] reveals right base  atelectasis, cardiomegaly with vascular  congestion, and mild  interstitial prominence.   ASSESSMENT:  1. Right foot abscess and cellulitis with osteomyelitis:  Status post      right second toe and right second metatarsal head amputation on      October 04, 2008,  performed by Dr. Pricilla Holm.  There is a concern      about vascular compromise and worsening abscess, and/or non-      resolution of the infection.  The results of the right and left      ABIs are above.  Vascular surgeon Dr. Arbie Cookey will be notified of the      patient's arrival.  2. History of intermittent fever and worsening leukocytosis:  In      review of the information sent from [facility], the      patient's urinalysis and chest x-ray recently were both      unremarkable.  3. Mild abdominal tenderness:  The patient does not have a rigid      abdomen, although she is mildly tender over the hypogastrium.  Per      the records sent from [facility], an ultrasound of the      abdomen was ordered sometime during the hospitalization for an      elevated bilirubin, and the results were unremarkable.  It did      mention that the patient's gallbladder was surgically absent.  4. Paroxysmal atrial fibrillation, atrial flutter with rapid      ventricular response during the hospitalization at [facility]:  The patient appears to be stable from this standpoint.      Per the old records, the patient was deemed to not be a Coumadin      candidate secondary to falls.  5. Type 2 diabetes mellitus:  The patient's capillary blood glucose is      currently controlled, although she is on a large dose of Lantus.  6. Noncritical coronary artery disease:  The patient is followed      chronically by cardiologist, Dr. Myrtis Ser.  7. Chronic renal insufficiency:  The patient's baseline creatinine is      unknown at this time.  However, her creatinine earlier today was      1.40.  8. Mild dementia:  Per my conversation with Mrs. Katrinka Blazing, her daughter,      the patient is currently a Full Code.   PLAN:  1. The patient is being admitted for further evaluation and      management.  2. Dr. Arbie Cookey will be notified of the patient's arrival.  3. Will reevaluate with a postoperative MRI of the  right foot.  4. Will continue antibiotic therapy with Zosyn and vancomycin.  Will      discontinue clindamycin that was ordered at [facility]      today.  5. Consider Infectious Disease consult if the patient continues to      have a progressive leukocytosis and/or if she becomes febrile.  6. Will remove the VAC device and obtain a Wound Care consult in      coordination with the vascular surgery consultation.  7. Will decrease the dose of Lantus and titrate back up accordingly.      Will add sliding scale NovoLog q.a.c. and q.h.s.  8. Will place parameters on the blood pressure medications to avoid      hypotension.  9. Will check an abdominal x-ray for mild abdominal tenderness, to      rule out ileus  and/or obstruction.  Will obtain amylase and lipase      in the morning.      Elliot Cousin, M.D.  Electronically Signed     DF/MEDQ  D:  10/10/2008  T:  10/10/2008  Job:  [id number]   cc:   [name], M.D.  [name], MD, [facility]

## 2011-01-21 NOTE — Consult Note (Signed)
NEW PATIENT CONSULTATION   Brittney [name], Brittney [name]  DOB:  [birth year]                                       09/19/2008  [id number]#:[id number]   I saw the patient in the office today in consultation concerning her  carotid disease.  This is a pleasant 77 year old woman who was found to  have a carotid bruit by Dr. [name].  He obtained a carotid duplex scan  which was done at [facility] which showed evidence of a 50-69%  left carotid stenosis and a less than 50% right carotid stenosis.  She  was sent for vascular consultation.  Of note, I have performed a  previous right carotid endarterectomy on her back in October of 1999.  She was last seen in our office in August of 2005.  She denies any  history of stroke, TIAs, expressive or receptive aphasia or amaurosis  fugax.  She does have occasional dizziness when she stands up rapidly.   PAST MEDICAL HISTORY:  Is significant for adult onset diabetes and she  is insulin dependent.  In addition, she has hypertension and  hypercholesterolemia.  She denies any history of previous myocardial  infarction, history of congestive heart failure or history of COPD.   FAMILY HISTORY:  There is no history of premature cardiovascular  disease.   SOCIAL HISTORY:  She is married.  She does not use tobacco.   REVIEW OF SYSTEMS AND MEDICATIONS:  Are documented on the medical  history form in her chart.   PHYSICAL EXAMINATION:  General:  This is a pleasant 77 year old woman  who appears her stated age.  Vital signs:  Blood pressure is 130/81,  heart rate is 73.  Neck:  Neck is supple.  There is no cervical  lymphadenopathy.  She has bilateral carotid bruits.  HEENT:  Is  unremarkable.  Lungs:  Are clear bilaterally to auscultation.  Cardiac:  She has a regular rate and rhythm.  Abdomen:  Soft and nontender.  I  cannot palpate an aneurysm.  She has palpable femoral pulses and warm  well-perfused feet without ischemic ulcers.  She has  no significant  lower extremity swelling.  Neurological:  Nonfocal with good strength in  her upper extremities and lower extremities bilaterally.   I did review her carotid duplex scan.  By velocity criteria she has no  evidence of significant recurrent carotid stenosis on the right.  Peak  systolic velocity is 112 cm/sec which would put her at less than 39%  stenosis.  On the left side there is, by velocity criteria, evidence of  a 40-59% stenosis.  She does have a tighter external carotid artery  stenosis on the left which is not clinically significant.   I have explained that we would not consider left carotid endarterectomy  in an asymptomatic patient unless the stenosis progressed to greater  than 80%.  I reassured her that she has no evidence of significant  recurrent stenosis on the right where she has had previous carotid  endarterectomy.  We have reviewed the potential symptoms of  cerebrovascular disease.  She knows to call if she develops any such  symptoms.  I have recommend a followup duplex scan in 6 months and I  will see her back at that time.  If her duplex remains stable at that  time we will likely stretch her  followup out to 1 year.  She does know  to continue taking her aspirin.   Di Kindle. Edilia Bo, M.D.  Electronically Signed   CSD/MEDQ  D:  09/19/2008  T:  09/20/2008  Job:  [id number]   cc:   [name], M.D.

## 2011-01-21 NOTE — Op Note (Signed)
Brittney [name], BUDGE NO.:  [id number]   MEDICAL RECORD NO.:  [id number]          PATIENT TYPE:  INP   LOCATION:  3733                         FACILITY:  [facility]   PHYSICIAN:  Nadara Mustard, MD     DATE OF BIRTH:  11-05-1931   DATE OF PROCEDURE:  10/18/2008  DATE OF DISCHARGE:                               OPERATIVE REPORT   PREOPERATIVE DIAGNOSIS:  Gangrene, right forefoot.   POSTOPERATIVE DIAGNOSIS:  Gangrene, right forefoot.   PROCEDURE:  Right midfoot amputation.   SURGEON:  Nadara Mustard, MD   ANESTHESIA:  Ankle block.   ESTIMATED BLOOD LOSS:  Minimal.   ANTIBIOTICS:  Vancomycin and Zosyn preoperatively.   DRAINS:  None.   COMPLICATIONS:  None.   TOURNIQUET TIME:  None.  Wound VAC set at minus 75 mmHg.   DISPOSITION:  To PACU in stable condition.   INDICATIONS FOR PROCEDURE:  The patient is a 77 year old woman with  peripheral vascular disease.  She is status post a second and third toe  amputations which became ischemic and necrotic.  She was brought to the  hospital.  Vascular evaluation determined that she had significant  peripheral vascular disease.  She underwent revascularization to the  right lower extremity and at this time with improved circulation the  patient presents for midfoot amputation remaining gangrenous forefoot.  Risks and benefits were discussed including infection, neurovascular  injury, nonhealing of wound, and need for higher-level amputation.  The  patient states he understands and wished to proceed at this time.   DESCRIPTION OF PROCEDURE:  The patient was brought to OR room 1 after  undergoing an ankle block.  After adequate level of anesthesia was  obtained, the patient's right lower extremity was prepped using  DuraPrep, draped into a sterile field, and the forefoot was draped out  with Ioban, so it was not in contact with the surgical field.  A  fishmouth incision was made through the midfoot.  This was carried  sharply down to bone.  She underwent a transmetatarsal amputation at the  base of the metatarsals beveled plantarly.  Hemostasis was obtained.  The wound was irrigated with normal saline.  There was good petechial  bleeding.  The wound was closed without tension on the  skin with a Donati vertical mattress suture.  After wound closure, a  wound VAC was applied over the wound and set to minus 75 mmHg suction.  This was set for continuous.  The wound VAC had good suction.  She was  then taken to PACU in stable condition.  Plan for discharge back to her  room with continuation of the wound VAC.      Nadara Mustard, MD  Electronically Signed     MVD/MEDQ  D:  10/18/2008  T:  10/18/2008  Job:  [id number]

## 2011-01-21 NOTE — H&P (Signed)
Brittney [name], Brittney [name] NO.:  [id number]   MEDICAL RECORD NO.:  [id number]          PATIENT TYPE:  OBV   LOCATION:  A210                          FACILITY:  [facility]   PHYSICIAN:  Thad Ranger, MD       DATE OF BIRTH:  Dec 20, 1931   DATE OF ADMISSION:  04/23/2009  DATE OF DISCHARGE:  LH                              HISTORY & PHYSICAL   PRIMARY CARE PHYSICIAN:  [name], M.D.   CHIEF COMPLAINT:  Acute chest pain.   HISTORY OF PRESENT ILLNESS:  Brittney [name] is a 77 year old female with  history of hypertension, diabetes, hyperlipidemia, history of atrial  fibrillation and diabetes who presented to [facility] Emergency Room  with acute chest pain.  History was obtained from the patient who stated  that the chest pain started suddenly at 8 a.m., in the morning when she  was still in her bed.  She described the chest pain as sharp and 9/10 in  intensity, located in the midsternal and left side and radiating to the  left arm.  The chest pain was aggravated by exertion and somewhat  relieved with rest.  She took her full-dose aspirin.  The chest pain has  also been associated with dyspnea and palpitation, however, no nausea,  vomiting, or diaphoresis.  She did feel some numbness and tingling in  her left arm.  On arrival to the emergency room, the chest pain was  spontaneously relieved; however, the patient states that she continues  to feel some pressure in the chest.  The patient denies any abdominal  pain, nausea, vomiting, or any diarrhea.   REVIEW OF SYSTEMS:  Ten-point review of systems is negative except  otherwise as dictated in above HPI.   PAST MEDICAL HISTORY:  1. Hypertension.  2. Diabetes.  3. Hyperlipidemia.  4. Atrial fibrillation.  5. Peripheral vascular disease status post stent 2009.  6. Early dementia.  7. History of right foot abscess with osteomyelitis with amputation in      January 2010.  8. History of bilateral carpal tunnel release.  9.  History of chronic renal insufficiency.  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  11.History of esophageal stricture status post dilatation.  12.Hypothyroidism.  13.History of anemia with chronic disease.  14.Right CEA.  15.Depression.  16.Vertebrobasilar insufficiency.  17.The patient had abdominal aortogram in June 2006, which showed a      high-grade stenosis in the left renal artery which was stented.      Cardiac catheterization in January 2005 showed nondominant small      right coronary artery with mid 50% stenosis, left main coronary      artery severely calcified, and suspicious high-grade ostial      stenosis in some views and 30-40% stenosis in other views, mild      noncritical coronary artery disease of the left anterior descending      and circumflex.   PAST SURGICAL HISTORY:  Carotid endarterectomy, cholecystectomy,  hysterectomy, tonsillectomy, and partial amputation of right foot.   SOCIAL HISTORY:  The patient denies any  smoking, alcohol, or any drug  use.  She currently lives alone by herself and uses cane for ambulation.   ALLERGIES:  The patient is allergic to MORPHINE.   MEDICATIONS PRIOR TO ADMISSION:  1. HCTZ 25 mg once a day.  2. Aspirin 325 mg once a day.  3. Lipitor 40 mg once a day.  4. Lisinopril 40 mg b.i.d.  5. Plavix 75 mg once a day.  6. Diltiazem CR 180 mg once a day.  7. Glipizide 5 mg b.i.d.  8. Sertraline 100 mg once a day.  9. Levothyroxine 25 mcg once a day.  10.Lantus subcu 40 units at bedtime.   PHYSICAL EXAMINATION AT THE TIME OF ADMISSION:  VITAL SIGNS:  Blood  pressure 155/55, pulse 61, respirations 20, O2 sat is 100% on room air,  afebrile.  GENERAL:  The patient is alert, awake, and oriented x3; not in any acute  distress.  HEENT:  Anicteric sclerae, pink conjunctivae.  Pupils are reactive to  light and accommodation.  EOMI.  NECK:  Supple.  No JVD.  No lymphadenopathy and no thyromegaly.  CVS:  S1 and S2  clear.  Regular rate and rhythm.  No murmur, rubs, or  gallops.  CHEST:  Clear to auscultation bilaterally.  No wheezing, rales, or  rhonchi.  No reproducible chest wall tenderness noted.  ABDOMEN:  Nontender, nondistended.  Normal bowel sounds.  No  hepatosplenomegaly.  EXTREMITIES:  No cyanosis, clubbing, or edema noted bilaterally.  Partial amputation of the right foot noted.  GU:  No Foley noted.  No CV angle tenderness noted.  SKIN:  No rashes or any decubitus noted.  NEURO:  No focal neurological deficits noted.  Cranial nerves II-XII  intact.  Motor strength 5/5 in upper and lower extremities.   LABORATORY DIAGNOSTIC DATA:  Sodium 135, potassium 4.4, bicarb 28, BUN  and creatinine 22 and 1.04.  WBC is 8.5, hemoglobin 12.1, hematocrit  34.8, and platelets 200.  Chest x-ray was done, which showed minimal  cardiac prominence and pulmonary vascular congestion.  No acute  abnormalities.  Multiple EKGs were done.  One EKG showed Q-waves in V4-  V6; however, no other ST-segment elevations or ST-segment depression  were seen.  It did show sinus bradycardia with marked sinus arrhythmia  at the rate of 56.  Right-sided EKG was normal.   IMPRESSION AND PLAN:  Brittney [name] is a 77 year old female with  significant risk factors of known coronary artery disease, hypertension,  diabetes, hyperlipidemia, atrial fibrillation, and severe peripheral  vascular disease who presents with acute chest pain.  The patient, as  mentioned before, has multiple risk factors and has typical presentation  of anginal chest pain.  1. Acute chest pain.  The patient will be admitted to medical floor on      a tele monitor.  She will be ruled out for MI.  TSH and fasting      lipid profile will be obtained.  The patient is started on aspirin,      nitroglycerin, and pain control.  Cardiology consult has been      placed with [facility] for consultation and possible stress      test versus further cardiac  intervention.  The patient has      bradycardia in 50s.  Hence, I will not prescribe any beta-blocker.      She is, however, noticed to be on calcium channel blocker as an      outpatient for atrial fibrillation, which  will be continued.  2. Hypertension.  The patient will be restarted on lisinopril and      Cardizem.  3. Diabetes mellitus.  I will hold glipizide and the patient will be      continued on Lantus and sliding scale insulin while inpatient.  4. History of atrial fibrillation.  I will continue Cardizem per      outpatient dose.  The patient is noted to be not on any Coumadin.      We will defer to Cardiology for recommendations for that.  5. History of severe peripheral vascular disease.  I will continue the      patient on aspirin and Plavix per previous recommendation.  6. Prophylaxis.  I will continue the patient on heparin for DVT      prophylaxis.  7. Code status.  I discussed in detail with the patient and her      daughter who was present in the room.  Per her living will, the      patient is DNR/DNI.      Thad Ranger, MD  Electronically Signed     RR/MEDQ  D:  04/23/2009  T:  04/24/2009  Job:  [id number]   cc:   [name], M.D.  Fax: [phone]   [name], MD, [facility]  [address]  [address]  [place]  [place] [address]

## 2011-01-21 NOTE — Discharge Summary (Signed)
Brittney [name], Brittney [name] NO.:  [id number]   MEDICAL RECORD NO.:  [id number]          PATIENT TYPE:  INP   LOCATION:  2024                         FACILITY:  [facility]   PHYSICIAN:  Duke Salvia, MD, FACCDATE OF BIRTH:  12-13-1931   DATE OF ADMISSION:  04/25/2009  DATE OF DISCHARGE:  05/06/2009                               DISCHARGE SUMMARY   PROCEDURES:  1. Cardiac catheterization.  2. Coronary arteriogram.  3. Left ventriculogram.  4. Transesophageal echocardiogram.  5. Synchronized biphasic direct current cardioversion.   PRIMARY/FINAL DISCHARGE DIAGNOSIS:  Atrial fibrillation and atypical-  appearing atrial flutter with rapid ventricular response.   SECONDARY DIAGNOSES:  1. Nonobstructive coronary artery disease by catheterization this      admission.  2. 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  3. Early dementia.  4. Gastroesophageal reflux disease with esophageal stricture      dilatation.  5. History of hypothyroidism, TSH 3.245 this admission.  6. Diabetes.  7. Hypertension.  8. Hyperlipidemia.  9. History of peripheral vascular and cerebrovascular disease.  10.Non-Q-wave myocardial infarction.  11.Remote history of cerebrovascular accident.  12.Status post right carotid endarterectomy.  13.Status post percutaneous transluminal angioplasty and stent to the      right peroneal and right superficial as well as right popliteal      arteries.  14.Chronic kidney disease stage III with a BUN of 29, creatinine 1.47,      and GFR 35 at discharge.  15.Anemia of chronic disease.  16.Gait disorder.  17.Status post cholecystectomy and hysterectomy.  18.History of vertebrobasilar insufficiency.  19.History of depression.  20.Allergy or intolerance to MORPHINE and OXYCODONE.  21.Anticoagulation with Coumadin started this  admission.  22.Acute renal insufficiency after catheterization.  23.Hyperkalemia, lisinopril held.   TIME AT DISCHARGE:  48 minutes.   HOSPITAL COURSE:  Brittney [name] is a 77 year old female with no history of  critical coronary artery disease but multiple cardiac risk factors.  She  went to [facility] on April 23, 2009, for chest pain.  She was  seen by Cardiology on April 24, 2009, having ruled in for a non-ST-  segment elevation MI.  Her EKG was normal sinus rhythm.  She was started  on heparin and aspirin as well as low-dose beta-blocker.  She was  transferred to [facility] on April 25, 2009, for catheterization.   On April 25, 2009, her EF was 55-60%.  The left main had a 40% ostial  lesion, and the LAD had a 30% stenosis.  There was a small OM with a 90%  ostial stenosis and an OM-2 with a 50-60% ostial stenosis.  There was a  mid RCA 40% stenosis.  The films were compared to previous cath films  and it was felt there was some progression.  The OM-1 was felt the  culprit vessel for a non-ST-segment elevation MI, but this vessel is  small and not an intervention target.  The films were reviewed by Dr.  Brodie and Dr. Shirlee Latch and it was felt that medical management was the  best option.   On the evening of April 25, 2009, she went into rapid AFib with a heart  rate in the 60s and 170s.  She was started on IV Cardizem for rate  control.  Full anticoagulation was ordered as well with heparin.  Dr.  Shirlee Latch reviewed the strips and felt that she was having atrial  tachycardia as well as coarse AFib.  She had some problems with  hypotension and the diltiazem drip was decreased.  Her heart rate  remained difficult to control, and her blood pressure was borderline.  Beta-blockers were initiated, but dose was decreased and then  discontinued because it was dropping her blood pressure and having a  very little effect on her heart rate.  She was started on Coumadin.  Since she  was not well rate controlled on medical therapy, a TEE  cardioversion was planned.  The TEE cardioversion was performed on  April 30, 2009.  By this time, her EF was 30% with septal akinesis.  The TEE showed no thrombus, no PFO, and no ASD.  She was shocked once  with a 150 joules, biphasic and synchronized.  She was converted to  sinus bradycardia in the 40s.  After the procedure, heart rate remained  in the 40s and her systolic blood pressure dropped briefly to 50.  She  was treated with IV fluids and atropine.  The diltiazem was  discontinued.  She was monitored carefully and seen by Cardiac Rehab.  She was feeling well and scheduled for discharge.  However, she went  back into atrial fibrillation with rapid ventricular response.  Low-dose  Cardizem was started p.o. and this was up titrated.  She was also  started on IV amiodarone.  Dr. Johney Frame assessed Brittney [name] and felt that  she was not an ablation candidate due to her atrial fib probably being  left sided.  He recommended rate control with amiodarone and digoxin.  The Cardizem was up titrated, as her blood pressure would tolerate.  Her  heart rate control was gradually improving.  The amiodarone was changed  to p.o.  She tolerated the medicines well.   On May 06, 2009, she was slightly hyperkalemic and this potassium was  described as not hemolyzed.  Her potassium was 5.4.  She has not been  receiving potassium supplementation or diuretic therapy.  Her INR was  minimally subtherapeutic at 1.9, but she has, otherwise, been  therapeutic for 4 days.  Dr. Graciela Husbands evaluated Brittney [name] and felt that  she could be safely discharged home with close outpatient followup and  direct current cardioversion planned once she is more completely loaded  with amiodarone.  Of note, she has had approximately 2 g load of  Amiodarone so far.  Brittney [name] was ambulating without chest pain or  shortness of breath.  Rate control was improving with a  combination of  amiodarone and diltiazem, although her heart rate is frequently greater  than 100, she is not symptomatic with this.   DISCHARGE INSTRUCTIONS:  Her activity level is to be increased  gradually.  She is to call our office for problems with the cath site.  She is to stick to a low-sodium diabetic diet.  She is to follow up with  the [facility] in [place] and we will contact her.  If she does not convert  spontaneously on the amiodarone, she will be scheduled for  an outpatient  direct current cardioversion.  She is to follow up with Dr. [name] as  needed.  Discharge medications are per the discharge computerized med  list.      Theodore Demark, PA-C      Duke Salvia, MD, [facility]  Electronically Signed    RB/MEDQ  D:  05/06/2009  T:  05/07/2009  Job:  [id number]   cc:   [facility] [place], [facility]  [name], M.D.

## 2011-01-21 NOTE — Assessment & Plan Note (Signed)
[facility]                          [facility] OFFICE NOTE   Brittney, [name]                       MRN:          [id number]  DATE:06/30/2008                            DOB:          11/03/1931    Brittney [name] is here for cardiology followup.  I see her for coronary  artery disease, atrial fibrillation, hypertension, and dyslipidemia.  I  saw her last on April 18, 2008.  At that time, she had had some chest  discomfort.  Also, her Holter monitor had shown good heart rate control  with sinus rhythm, but one brief episode of supraventricular tachycardia  of approximately 5 beats.  Therefore, we put her on low-dose of  diltiazem and arranged for a Cardiolite scan.  The Cardiolite study was  done on April 24, 2008.  This showed no marked abnormality.  There was  good LV function.  She has felt well.  She is not having any  palpitations.  She has no chest pain.  There is no syncope or  presyncope.  She had carpal tunnel surgery earlier this week and she has  done fine.   PAST MEDICAL HISTORY:   ALLERGIES:  MORPHINE.   MEDICATIONS:  1. Aspirin.  2. Lipitor.  3. Hydrochlorothiazide.  4. Vitamin D.  5. Lisinopril.  6. Sertraline.  7. Eye drops.  8. Diltiazem 120.  9. Glipizide.  10.Vitamins.  11.Lantus.   OTHER MEDICAL PROBLEMS:  See the complete list on the note of April 18, 2008.   REVIEW OF SYSTEMS:  She is not having any GI or GU symptoms.  She has no  fevers or chills.  She has no skin rashes.  Otherwise, her review of  systems is negative.   PHYSICAL EXAMINATION:  VITAL SIGNS:  Blood pressure is 148/69 with a  pulse of 53.  GENERAL:  The patient is oriented to person, time, and place.  Affect is  normal.  HEENT:  Reveals no xanthelasma.  She has normal extraocular motion.  NECK:  There are no carotid bruits.  There is no jugular venous  distention.  LUNGS:  Clear.  Respiratory effort is nonlabored.  CARDIAC:  Reveals an  S1 with an S2.  There are no clicks or significant  murmurs.  ABDOMEN:  Soft.  EXTREMITIES:  She has no peripheral edema.   Problems are listed completely on the note of April 18, 2008.  #1.  Paroxysmal atrial fibrillation.  She is holding sinus and she is  stable.  #3.  Coronary artery disease.  There is no ischemia by recent  Cardiolite.   She is doing well.  I will not change any of her meds.  I will see her  back in 6 months for followup.     [name], MD, [facility]  Electronically Signed    JDK/MedQ  DD: 06/30/2008  DT: 07/01/2008  Job #: [id number]   cc:   [name], M.D.

## 2011-01-21 NOTE — Assessment & Plan Note (Signed)
OFFICE VISIT   Brittney [name], Brittney [name]  DOB:  [birth year]                                       11/20/2008  VHQIO#:[id number]   REASON FOR VISIT:  Followup.   HISTORY:  This is a 77 year old female who presented with a nonhealing  ulcer on her right leg.  I performed revascularization of her right leg.  At the time of arteriogram, she was found to have occluded posterior  tibial, anterior tibial, and peroneal arteries.  There was  reconstitution of the peroneal artery 10 cm distal to its origin.  I  performed subintimal dissection with subsequent balloon angioplasty and  stenting.  This resulted in a widely patent peroneal artery.  Dr. Lajoyce Corners,  several days later, proceeded with a transmetatarsal amputation.  She  comes back in for followup.  She is doing well at this time.  She is on  maintained on aspirin and Plavix.   PHYSICAL EXAMINATION:  Blood pressure is 160/70, pulse 71.  She is well  appearing, in no acute distress.  Her right transmetatarsal amputation  is healing nicely.   DIAGNOSTIC STUDIES:  The patient underwent ultrasound today which  reveals an ankle brachial index of 1.0 on the right and 0.93 on the  left.   ASSESSMENT/PLAN:  Status post percutaneous revascularization of the  right leg for nonhealing wound.   PLAN:  The patient will be maintained on aspirin and Plavix.  She is  going to started on our ultrasound surveillance protocol.  She is a  previous patient of Dr. Edilia Bo so I will let him continue to follow her  and if any issues arise regarding her lower extremity, I will see her  back.  Again, I want to keep her on her aspirin and Plavix.   Jorge Ny, MD  Electronically Signed   VWB/MEDQ  D:  11/20/2008  T:  11/22/2008  Job:  [id number]   cc:   Nadara Mustard, MD  Di Kindle. Edilia Bo, M.D.  [name], M.D.

## 2011-01-21 NOTE — Consult Note (Signed)
NAMEMYCA, PERNO NO.:  [id number]   MEDICAL RECORD NO.:  [id number]          PATIENT TYPE:  OBV   LOCATION:  A210                          FACILITY:  [facility]   PHYSICIAN:  [name]. [name], MD, FACCDATE OF BIRTH:  Jan 29, 1932   DATE OF CONSULTATION:  04/24/2009  DATE OF DISCHARGE:                                 CONSULTATION   REFERRING PHYSICIAN:  Dr. Rito Ehrlich.   PRIMARY CARE PHYSICIAN:  [name], MD   PRIMARY CARDIOLOGIST:  [name], MD, [facility]   PRIMARY VASCULAR SURGEONS:  1. Di Kindle. Edilia Bo, MD  2. Juleen China IV, MD   HISTORY OF PRESENT ILLNESS:  A 77 year old woman with multiple  cardiovascular risk factors including diabetes, hypertension, and  hyperlipidemia and known cardiovascular, peripheral vascular, and  cerebrovascular disease, and now presents with a small non-Q-myocardial  infarction.  Ms. Yeomans cardiac history dates to 2005 when she was  admitted with chest discomfort and tachycardia.  She was believed at  that time she suffered an episode of PSVT, but subsequently has had only  atrial fibrillation and atrial flutter.  She underwent cardiac  catheterization, revealing what was described as nonobstructive coronary  artery disease; however, there was a question as to whether one of  moderate lesions, was actually significant.  Left ventricular systolic  function was normal.  She previously undergone catheterization in 2000,  revealing similar coronary anatomy.  She subsequently was treated  medically for hyperlipidemia, hypertension, and diabetes and has done  well.  She has a remote history of CVA and followed by right carotid  endarterectomy.  She was told that the vessel was kinked, but not truly  stenosed.  She has peripheral vascular disease and has undergone stent  implantation on the right.  She subsequently required transmetatarsal  amputation for nonhealing ulcer of the right foot.  Her most recent  cardiac admission was in July 2009, for palpitations associated with  paroxysmal atrial fibrillation.  Due to the brevity of that episode, she  was not thought to require chronic anticoagulation.  She also has a  history of fall, on which tends to be risk benefit ratio more towards  the risky side.   Cardiac catheterization in 2005 was not showing no significant  obstructive disease.  Specifically, the worst lesion was a 50% mid RCA  stenosis.  The left main was heavily calcified, but not thought to have  significant obstruction when assessed by IVUS.   The current episode began with the sudden onset of anterior chest  pressure and left upper back pain while the patient was resting in bed  early in the morning.  The intensity was moderate to severe.  There was  a sharp component as well as radiation to the left arm.  She has had  associated dyspnea, correction and palpitations, but no nausea or  diaphoresis.  Her symptoms faded soon after she arrived in the emergency  department.  An initial EKG showed no acute changes.   PAST MEDICAL HISTORY:  Otherwise notable for early dementia, prior  bilateral carpal tunnel surgery, GERD  with dilatation, previously  required for esophageal stricture, hypothyroidism, anemia of chronic  disease, depression, and left renal artery stenosis for which a stent  was placed in 02/2005.  Sinus bradycardia without symptoms.   PRIOR SURGERIES:  Cholecystectomy, hysterectomy, and tonsillectomy.  Excisional biopsy of benign mass from the right breast.   SOCIAL HISTORY:  Lives alone and ambulates independently with a cane;  has a very attentive daughter, who lives nearby; widowed; no use of  tobacco products or alcohol.   FAMILY HISTORY:  Father died due to carcinoma of the brain; mother died  as a results of abdominal aortic aneurysm.   REVIEW OF SYSTEMS:  Some impairment of memory; she has decreased vision  in her right eye, related to a vascular event.   She has continuing  problems with wound healing, most recently on her left foot with a minor  skin ulceration.  She eats a regular diet.  She has some intermittent  constipation.  She has some difficulty initiating urination.  She  reports visual impairment with corrective lenses needed for near vision.  There is a history of glaucoma.  She has impaired hearing bilaterally.  She has upper and lower dentures.  All other systems reviewed and are  negative.   PHYSICAL EXAMINATION:  GENERAL:  Overweight woman who is clearly hard of  hearing and has some impaired understanding and judgment, but is in no  acute distress.  VITAL SIGNS:  The temperature is 98.1, heart rate 56 and regular,  respirations 19, blood pressure 180/83, O2 saturation is 99% on 2 L,  weight 77.2 kg, and height 67 inches.  HEENT:  Anicteric sclerae; normal lids and conjunctivae; normal oral  mucosa.  NECK:  No jugular venous distention; ectatic right carotid; no carotid  bruits appreciated; right carotid endarterectomy scar.  ENDOCRINE:  No  thyromegaly.  HEMATOPOIETIC:  No adenopathy.  SKIN:  No significant lesions.  LUNGS:  Coarse rales at the left base.  CARDIAC:  Normal first and second heart sounds; modest early systolic  ejection murmur.  ABDOMEN:  Soft and nontender; normal bowel sounds; no bruits; no  organomegaly.  EXTREMITIES:  Status post right transmetatarsal amputation; distal  pulses intact; no significant edema.  NEUROLOGIC:  Symmetric strength and tone; normal cranial nerves.  PSYCHIATRIC:  Normal affect.   An initial laboratory notable for a normal CBC except for a hematocrit  of 34.8, serum glucose is ranging from 50-287, normal chemistry profile  except for glucose, hemoglobin A1c of 8.3, normal CPK and CPK-MB, but  troponin increasing from initially normal values to 0.13 - 0.19 and a  good lipid profile with total cholesterol of 129, triglycerides of 80,  HDL of 39, and LDL of 74.   EKG:   Normal sinus rhythm; somewhat delayed R-wave progression, probably  related to lead placement; minimal nonspecific ST-segment abnormality.   Chest x-ray:  Mild cardiac enlargement; minimal vascular redistribution;  no acute abnormalities.   IMPRESSION:  Ms. Renda presents with ischemic chest discomfort and a  very modest elevation of troponin consistent with an acute coronary  syndrome.  She has been symptom free on somewhat submaximal therapy.  We  will add intravenous heparin.  We will attempt to substitute low-dose  beta-blocker for diltiazem and add amlodipine for better control of  blood pressure.  Due to her episode of hypoglycemia, her dose of Lantus  insulin will be reduced tonight since she will be n.p.o. tomorrow.  Intravenous D5 normal saline will be infused  overnight.  We will plan  repeat cardiac catheterization in the morning, a course with which she  and her daughter agree.  Her other medications all are effective and  will be continued.      [name]. [name], MD, [facility]  Electronically Signed     RMR/MEDQ  D:  04/24/2009  T:  04/25/2009  Job:  [id number]

## 2011-01-24 NOTE — H&P (Signed)
[place]. [facility]  Patient:    Brittney [name], Brittney [name] Visit Number: [id number] MRN: [id number]          Service Type: MED Location: [phone] Attending Physician:  Nathen May Dictated by:   Anna Genre. Maisie Fus, M.D. [facility] Admit Date:  01/08/2002                           History and Physical  HISTORY OF PRESENT ILLNESS:  Brittney [name] is a 77 year old with a history of nonsignificant coronary artery disease.  Last cardiac catheterization in July of 2000 showed a 30% left main lesion, 50-60% proximal LAD, 20% mid-LAD, 20-30% mid-circumflex, 50% obtuse marginal second branch, a non-dominant RCA with an ejection fraction of 60%.  She presents to the emergency department today with chest discomfort that started today that was largely nonexertional and unrelieved with nitroglycerin spray.  She also had an episode of altered mental status and headache.  She underwent a head CT in the emergency department which was negative for any acute changes and reportedly had an MRI at an outside facility and had followup appointment scheduled to discuss the results of that at the end of the month.  In the emergency department, her mental status cleared, she denied any other associated symptoms at that time and was found to have mild congestive heart failure by chest x-ray.  PAST MEDICAL HISTORY: 1. Coronary artery disease, cath as above.  The patient did have a Persantine    Cardiolite with anterior ischemia back in 2000.  Subsequent IVUS was    attempted of the LAD, however, the results were inconclusive secondary to    inability to pass a wire ______ .  It was felt that her LAD lesion was    probably non-flow-limiting and she was recommended for medical therapy. 2. Esophagitis, dysphagia. 3. Hyperlipidemia. 4. Diabetes mellitus. 5. Hypertension. 6. History of cerebrovascular accident, status post right carotid    endarterectomy. 7. Peripheral vascular disease,  status post right fourth toe amputation.  MEDICATIONS: 1. Lisinopril 80 mg p.o. q.d. 2. Alprazolam 0.25 mg p.o. b.i.d. p.r.n. 3. Insulin 30 units of 70/30 every morning and 32 units in the evening. 4. Foltx one tab daily. 5. Zoloft 100 mg p.o. q.d.  ALLERGIES:  Significant for MORPHINE SULFATE which causes hallucinations.  SOCIAL HISTORY:  The patient is married and has a twin sister and a daughter, not currently employed.  Denies any tobacco or alcohol use.  FAMILY HISTORY:  Known history of early CAD, otherwise, is noncontributory to this admission.  REVIEW OF SYSTEMS:  Positive for headache, altered mental status and chest pain.  Pertinent negatives include no orthopnea or PND.  No weight loss or gain.  Without fevers and otherwise is negative.  PHYSICAL EXAMINATION:  VITAL SIGNS:  Blood pressure was 199/97, pulse 61, respiratory rate 16, O2 saturation 93% on room air.  HEENT:  Unremarkable.  She is alert and oriented x3 and appears comfortable.  NECK:  Supple with no adenopathy.  She has a right-sided scar from her prior carotid endarterectomy.  Her JVP is not elevated.  There are no audible bruits.  Carotids are 2+ and symmetric.  CARDIOVASCULAR:  Regular rate and rhythm, 2/6 systolic ejection murmur at the upper sternal border.  There is no S3 or S4.  She has a normal S1 and S2.  LUNGS:  Clear to auscultation bilaterally.  ABDOMEN:  Soft, nontender and nondistended.  She  is obese and has positive bowel sounds and no organomegaly.  EXTREMITIES:  No evidence of edema.  Distal pulses are absent on the right, 1+ on the left and there is a femoral bruit on the right.  LABORATORY AND ACCESSORY DATA:  Laboratory values are significant for a white count of 10.7, hematocrit of 42, creatinine of 1.0, normal ______, albumin of 3.6, calcium 9.6, magnesium 2.0, total CK of 84, MB of 2.3 and a troponin I of 0.02.  IMPRESSION AND PLAN:  She is a 77 year old with a history of  insignificant coronary artery disease who presents with angina and x-ray evidence of congestive heart failure.  1. Acute coronary syndrome:  We will start on enoxaparin.  She is not on a    beta blocker secondary to bradycardia and we will plan on performing a    functional study on her on Monday. 2. Hypertension:  We will add amlodipine.  She is currently bradycardic so we    will hold on beta blocker initiation.  She would obviously benefit from    beta blocker for secondary prevention of progressive coronary artery    disease and myocardial infarction. 3. Altered mental status:  It was likely secondary to hyperglycemia.  We will    continue her on her home insulin dose with a sliding-scale insulin and    Accu-Cheks q.a.c. and h.s.  Of note, her head CT was negative. Dictated by:   Anna Genre. Maisie Fus, M.D. [facility] Attending Physician:  Nathen May DD:  01/09/02 TD:  01/10/02 Job: [id number]

## 2011-01-24 NOTE — Consult Note (Signed)
[place]. [facility]  Patient:    Brittney [name], Brittney [name] Visit Number: [id number] MRN: [id number]          Service Type: MED Location: [phone] Attending Physician:  Nathen May Dictated by:   Kelli Hope, M.D. Proc. Date: 01/08/02 Admit Date:  01/08/2002                            Consultation Report  DATE OF BIRTH:  September 22, 1946  REQUESTING PHYSICIAN:  Nathen May, M.D.  REASON FOR EVALUATION:  Confusional episode.  HISTORY OF PRESENT ILLNESS:  This is a hospital consultation evaluation of this existing [facility] patient.  A 77 year old woman with multiple medical problems who has been seen by Dr. Anne Hahn in the past, but no notes are available to me, only hospital discharge summary from December 2001.  Patient was admitted yesterday after a confusional episode. The patient reports that she awoke yesterday feeling "bad."  She cannot elaborate any further on this.  She says she has no more memory of the days events.  Her husband says that she seemed normal externally, was able to walk around, speak, and follow commands.  However, she kept asking who he was and where she was and at one point became very tearful and upset.  He eventually brought her to the emergency room to be evaluated.  In the emergency room upon admission her mental status is remarked upon only in oblique fashion, although it was noted that she was oriented to place and time.  She complained of chest pain and was subsequently admitted to [facility].  Today she is feeling better.  She denies any previous history of ______ as does her husband.  They do report that she is having episodes of "falling down" where she will fall over backwards.  She will feel weak all over with these episodes and generally will feel dizzy beforehand.  Of note, she has had very similar episodes before and this was ascribed in the past to diabetic  dysautonomia. She was treated with Florinef with improvement.  She apparently has been undergoing a neurologic work-up in [place] and was recently told that she had "cells in her head" on the basis of an MRI done at [facility]. She was to see a neurologist soon for this but it is unclear who that is.  PAST MEDICAL HISTORY:  She was admitted for some episodes that were described as presyncopal events in December 2001.  She had a cerebral angiogram at that time which demonstrated significant posterior circulation disease, but was not felt to be a candidate for an interventional procedure.  Again, the episodes were ascribed to dysautonomia and she was treated with Florinef with improvement.  History also includes diabetes, hypertension, coronary artery disease, peripheral vascular disease, and anxiety, particularly social phobia.  SOCIAL HISTORY:  She lives with her husband.  He says that she has had a decrease ability to maintain her household chores recently such as paying bills.  She does not drink or smoke.  MEDICATIONS:  Zoloft, Zestril, Xanax, insulin.  In the hospital she has also been started on Norvasc and Lasix.  REVIEW OF SYSTEMS:  There has been a memory decline over the last six to eight months according to her husband.  She now has to let her daughter pay her bills.  She says that she has had a "stroke in the right eye" in  the past, although this is not obvious on reviewing her imaging results.  There is no known history of seizure.  She did have chest pain on the day of admission and was felt to have mild heart failure by x-ray.  Otherwise, per HPI and admission H&P.  PHYSICAL EXAMINATION  VITAL SIGNS:  Temperature 100.5, blood pressure 140/65, pulse 75, respirations 22.  GENERAL/MENTAL STATUS:  She is awake, alert.  She is completely oriented at this time and is fully appropriate.  Her short and long-term memory seems intact.  NECK:  Supple with a left  carotid and supraclavicular bruit.  HEART:  Regular rate and rhythm without murmurs.  NEUROLOGIC:  Mental status as above.  Cranial nerves:  Funduscopic examination is benign.  Pupils are equal and briskly reactive.  Extraocular movements are normal without nystagmus.  Examination of visual fields reveals some limitation in the right upper and lower quadrants.  Face, tongue, and palate move normally and symmetrically.  Motor:  Normal bulk and tone.  Normal strength in all tested extremity muscles.  Sensory examination reveals decreased pin prick sensation in the feet, but intact light touch.  Reflexes are diminished at the ankles, but intact and symmetric elsewhere.  Toes are downgoing.  Finger-to-nose is performed well.  She is able to stand and walk fairly well.  LABORATORIES:  CT of the head performed yesterday in the ER is personally reviewed and demonstrates atrophy.  Report of an MRI performed at [facility] on December 28, 2001 and interpreted by Dr. Constance Goltz is reviewed.  It is said to reveal small caliber vessels in the posterior circulation, artifact versus disease with small vessel disease and atrophy, but no major cortical strokes.  IMPRESSION: 1. Episode of confusion.  Etiology of this is unclear.  There may be partial    seizure activity going on.  It may have been associated with hypoglycemia.    It may have been an episode of transient global amnesia.  It is possible    that she had a left posterior cerebral stroke with a confusional episode    and extended her right visual field cut. 2. Dizziness and falls probably secondary to dysautonomia.  RECOMMENDATIONS:  Will check EEG.  After discussion with Dr. Anne Hahn tomorrow, may repeat MRI/MRA or possibly pursue an angiogram.  Would suggest that Lasix be held and consideration be given to starting midodrine or Florinef.  Will follow with you.  Thank you for the consult. Dictated by:   Kelli Hope, M.D. Attending Physician:   Nathen May DD:  01/09/02 TD:  01/11/02 Job: [id number]

## 2011-01-24 NOTE — Discharge Summary (Signed)
NAMEPAETON, LATOUCHE NO.:  [id number]   MEDICAL RECORD NO.:  [id number]          PATIENT TYPE:  IPS   LOCATION:  4005                         FACILITY:  [facility]   PHYSICIAN:  Ranelle Oyster, M.D.DATE OF BIRTH:  23-Feb-1932   DATE OF ADMISSION:  10/24/2008  DATE OF DISCHARGE:  10/31/2008                               DISCHARGE SUMMARY   DISCHARGE DIAGNOSES:  1. Right mid foot amputation.  2. Diabetes mellitus type 2.  3. Acute blood loss anemia.  4. Leukocytosis resolved.   HISTORY OF PRESENT ILLNESS:  Ms. Chronister is a 77 year old female with  history of diabetes mellitus, right foot abscess with osteomyelitis  requiring I&D and right second toe and metatarsal head amputation on  October 04, 2008 at [facility].  Post procedure the patient  continued with fevers and leukocytosis as well as atrial flutter  requiring transfer to [facility] on October 10, 2008 for  further workup.  [facility] has been following the patient and  she was treated with Cardizem.  No Coumadin secondary to fall risk.  Dr.  Myra Gianotti the vascular surgeon was consulted for input on the patient's  right lower extremity and right peroneal artery SFA stent was placed on  October 12, 2008.  Dr. Lajoyce Corners consulted and the patient continued with  nonhealing wound.  Due to gangrenous changes of right great toe, the  patient underwent right mid foot amputation on October 18, 2008 by Dr.  Lajoyce Corners.  VAC was kept in place to promote wound healing until October 21, 2008.  The patient currently continues on IV vancomycin and Zosyn since  admission.  On October 19, 2008 the patient was noted to have T-max of  101 and Cipro and Diflucan was added additionally for treatment.  The  patient has had issues with acute blood loss anemia with hemoglobin  dropping to 7.8 and required transfusion of 2 units of packed red blood  cells.  As cultures from October 19, 2008 negative Cipro was  discontinued on October 21, 2008.  She continues on IV antibiotics.  Therapies are ongoing and the patient is noted to have difficulty  maintaining nonweightbearing status and OT noted to be deconditioned  with decreased endurance level.  The patient was evaluated by  physiatrist and it was felt that she would benefit from inpatient rehab  stay.   PAST MEDICAL HISTORY:  Significant for:  1. DM type 2.  2. Coronary artery disease.  3. AFib.  4. Dyslipidemia.  5. Right carpal tunnel release.  6. Chronic renal insufficiency.  7. CVA with decreased vision right eye.  8. History of esophageal strictures status post dilatation.  9. Hypothyroid.  10.Anemia of chronic disease.  11.Peripheral vascular disease.  12.Gait disorder.  13.Right CEA.  14.Cholecystectomy.  15.Hysterectomy.  16.Vertebrobasilar insufficiency.  17.Depression.   ALLERGIES:  MORPHINE.   FAMILY HISTORY:  Positive for diabetes.   SOCIAL HISTORY:  The patient is married, lives in one-level home with a  ramp at entry, is a retired Lawyer.  She does not use any tobacco or  alcohol.  FUNCTIONAL HISTORY:  The patient was independent for ADLs prior to  admission.  She does not drive.   FUNCTIONAL STATUS:  The patient is min assist for transfers, min guard  assist ambulating 5 feet x2, requires setup assist for upper body care,  mod assist for lower body care.   PHYSICAL EXAMINATION:  VITALS:  Blood pressure 162/62, pulse 57,  temperature 98.3, respiratory rate 22, height 67 inches, weight 77 kg.  GENERAL:  The patient is pleasant, oriented older female in no acute  distress.  HEENT:  Pupils equal, round, and reactive to light.  Oral mucosa shows  complete set of dentures.  Mucosa is pink and moist.  NECK:  Supple without JVD or lymphadenopathy.  LUNGS:  Clear to auscultation bilaterally without wheezes, rales or  rhonchi.  HEART:  Regular rate and rhythm without murmurs, gallops or rubs.  ABDOMEN:  Soft,  nontender with positive bowel sounds.  She has  ecchymotic area on left lower quadrant secondary to heparin injections.  EXTREMITIES:  Right mid foot amputation site with sutures in place and  some gaping noted at medial aspect of incision with mild erythema  medially.  No edema or cyanosis noted in bilateral upper extremity or  left lower extremity.  NEUROLOGIC:  Cranial nerves II-XII are notable for decreased hearing and  poor vision right eye.  Reflexes 1+.  The patient denies sensory loss in  distal limb.  Motor function is generally 4- 5/5 in upper extremity,  right lower extremity is 3+/5 proximal at hip, 4/5 at knee.  Ankle  movement was 3- to 4/5.  Left lower extremity was 3- to 4/5 proximally  and 4/5 distally.  Judgment, orientation, memory and mood are noted to  be good.   HOSPITAL COURSE:  Ms. Daksha Koone was admitted to rehab on October 24, 2008 for inpatient therapies to consist of PT, OT at least 3 hours 7  days a week.  Past admission physiatrist, rehab RN and therapy team have  worked together to provide customized collaborative interdisciplinary  care.  During the patient's stay in rehab, weekly team conferences were  held to assess the patient's progress, set goals as well as discuss  barriers to discharge.  Rehab RN has been assisting with wound care and  daily dressing changes.  The patient's wound is noted to be healing  well.  No drainage noted.  She does continue with dry dressing with  sutures remaining in place at time of discharge.  She is to follow up  with Dr. Lajoyce Corners in 3-5 days for postop check and for suture removal.  The  patient was maintained on IV vancomycin and Zosyn initially.  Dr. Lajoyce Corners  was contacted regarding input on antibiotics as the patient had  completed almost 22 days of antibiotics.  The patient's labs of October 25, 2008 this showed some mild leukocytosis with white count at 13.4 but  no fevers noted.  Vancomycin and Zosyn as well as  Diflucan was  discontinued on October 25, 2008 as input with Orthopedics.  Check of  lytes at past admission revealed sodium 136, potassium 4.2, chloride 96,  CO2 32, BUN 18, creatinine 1.51, and glucose 103.  The patient was noted  to have acute-on-chronic renal insufficiency and this was monitored  along.  She was encouraged to push p.o. fluids.  The patient's blood  pressures were monitored on b.i.d. basis.  Initially, they were  noted  to be elevated with systolics in 150s,  especially in a.m. prior to her  a.m. meds.  Blood pressures at the time of discharge ranging from 130s  to an occasional high in 170s systolic and 60s to 70s diastolic.  Heart  rate has been stable in normal sinus rhythm and has ranged from 50s to  60s range.  The patient's acute blood loss anemia has been followed  along and this has been stable.  Last check of CBC of October 30, 2008  reveals hemoglobin at 10.3, hematocrit 30.5, white count 9.9, platelets  495.  The patient is encouraged to continue iron supplements for at  least an additional month at b.i.d. basis to help her acute-on-chronic  anemia.  The patient's renal status have been followed up on October 27, 2008.  Overall renal insufficiency was noted to be much improved  with labs revealing sodium 136, potassium 4.4, chloride 99, CO2 32, BUN  21, creatinine 1.3, and glucose 128.  The patient's blood sugars have  been monitored on a.c. nightly basis.  These have fluctuated greatly.  At times tightly controlling blood sugars have resulted in  hypoglycemic  episodes with blood sugars dropping in 40s.  The patient's Lantus  insulin was decreased to 25 units due to hypoglycemia.  As blood sugars  started to trend back up, this was increased back to 35 units subcu.  Blood sugars prior to discharge ranged from 80s to 240s.  The patient is  advised to continue checking blood sugars on b.i.d. basis and is to  follow up with Dr. [name] regarding further  adjustments of Lantus  insulin past discharge.   During the patient's stay in rehab, rehab RN has been assisting with  toileting the patient q.4 hours for maintenance of continence.  Also  bowel program has been adjusted for constipation issues with p.r.n.  sorbitol as needed.  Rehab RN has also been working with therapy team  and premedicating the patient prior to therapies as needed to help  better participation in therapy.  At the time of admission, PT, OT eval  revealed the patient with decrease in activity tolerance, decrease in  dynamic standing balance, decrease in standing endurance and decreased  ability to follow nonweightbearing status.  OT has been working with the  patient on stand pivot transfers as well as dynamic sitting and standing  balance and the patient initially required min to mod assist for  transfers with min cues for safety.  ADL training has focused on  transfers to bedside commode as well as shower transfers with focus on  safety with squat transfers for toilet and tub bench.  The patient  progressed along to being at modified independent to don and doff  clothing lower body.  She does continue to have some issues with safety  while standing for self-care.  She is advised to do self-care in seated  position and to stand at the edge of sink only if daughter is present to  provide min assist.  Family education was done with the patient and  daughter regarding self-care needs as well as transfers to toilet and  shower and functional mobility in the kitchen when in wheelchair.  PT  evaluation at the time of admit revealed the patient at supervision for  bed mobility, min assist for sit to stand.  She was able to ambulate 10  feet x4 with rolling walker and min assist taking small hopping steps  and limited by quick fatigue due to generalized weakness in left lower  extremity and bilateral upper extremity.  At the time of discharge, the  patient has progressed  along to being at modified independent for bed  mobility and bed to wheelchair transfers.  She is able to perform squat  pivot car transfers with supervision.  She is able to perform stand  pivot transfers to higher surfaces with supervision.  She is able to  propel wheelchair greater than 200 feet at modified independent level.  She does require min assist to navigate a ramp.  Family education was  done with daughter to include all transfers as well as functional  mobility training.  Daughter is able to demonstrate safe ramp usage with  the patient in wheelchair.  The patient will continue to receive further  followup home health, PT, OT by [facility], Home Health RN has  also been arranged for wound care monitoring.  On October 31, 2008, the  patient is discharged to home with 24-hour supervision to be provided by  family.   DISCHARGE MEDICATIONS:  1. Lantus insulin 35 units at bedtime.  2. Lipitor 40 mg at bedtime.  3. Ecotrin 325 mg a day.  4. Prinivil 40 mg a day.  5. Vitamin D 400 units per day.  6. Metoprolol 50 mg b.i.d.  7. Synthroid 50 mcg per day.  8. Plavix 75 mg a day.  9. Zoloft 50 mg a day.  10.Norvasc 5 mg a day.  11.Ferrous sulfate 325 mg b.i.d.  12.Protonix 40 mg per day p.r.n.  13.Travatan 0.004% one gtt both eyes b.i.d.  14.OxyIR 5 mg one to two q.6-8 h .p.r.n. pain #45 prescribed.   DIET:  Diabetic diet.   WOUND CARE:  Dry dressing to left foot daily.   ACTIVITY LEVEL:  24-hour supervision.  No strenuous activity.   SPECIAL INSTRUCTIONS:  No weight on the right foot.  Check blood sugars  b.i.d. basis and record.  [facility] to provide PT, OT and RN.   FOLLOW UP:  The patient to follow up with Dr. [name] in 2 weeks for  routine check.  Follow up with Dr. Lajoyce Corners for postop check in 3-5 days.  Follow up with Dr. Myra Gianotti in the next 1-2 weeks.  Follow up with Dr.  Riley Kill as needed.      Greg Cutter, P.A.      Ranelle Oyster,  M.D.  Electronically Signed    PP/MEDQ  D:  11/01/2008  T:  11/02/2008  Job:  [id number]   cc:   [name], M.D.  Nadara Mustard, MD  V. Charlena Cross, MD

## 2011-01-24 NOTE — H&P (Signed)
NAME:  Brittney [name], Brittney [name]                          ACCOUNT NO.:  [id number]   MEDICAL RECORD NO.:  [id number]                   PATIENT TYPE:  INP   LOCATION:  2113                                 FACILITY:  [facility]   PHYSICIAN:  Richard A. Alanda Amass, M.D.          DATE OF BIRTH:  [birth year]   DATE OF ADMISSION:  09/08/2003  DATE OF DISCHARGE:                                HISTORY & PHYSICAL   HISTORY OF PRESENT ILLNESS:  Brittney [name] is a 77 year old white woman who  was transferred from [facility] to [facility] for further  evaluation of chest pain and tachycardia.   The patient presented to the emergency department with a history of chest  pain which began earlier this evening while she was doing laundry.  The  chest pain is described as a pressure in the left anterior chest.  It  radiates to the left arm.  It is associated with mild dyspnea, diaphoresis,  or nausea.  There were no exacerbating or ameliorating factors.  It appeared  not to be related to position, activity, meals, or respirations.  It was  relieved somewhat with nitroglycerin, which was administered in the [facility] Emergency Department.  In the [facility] Emergency Department, she  developed a tachycardia which was felt to be a supraventricular tachycardia.  Various drugs were administered but at the time of transfer to [facility], her  heart rate was regular and at the rate of approximately 140 to 150 beats per  minute.   The patient has a history of having undergone cardiac catheterization in  July of 2000 for chest pain.  This demonstrated a 30% left main lesion, a 50-  60% proximal left anterior descending lesion, a 20% mid LAD lesion, a 20-30%  mid-circumflex lesion, and a 50% obtuse marginal lesion.  Her ejection  fraction was 60%.  During her most recent hospitalization in May of 2003, a  Cardiolite stress test demonstrated an ejection fraction of 52% and no  evidence of ischemia.  There is no  history of congestive heart failure or  arrhythmia.   The patient has a history of insulin-dependent diabetes mellitus,  hypertension, and hyperlipidemia.  There is no family history of early  coronary artery disease.  The patient does not smoke.   PAST MEDICAL HISTORY:  The patient's past medical history is also notable  for cerebrovascular accident; she is status post right carotid  endarterectomy.  In addition, she has peripheral vascular disease; she has  undergone right 4th toe amputation.   The patient also has a longstanding history of syncopal spells and mental  status changes; the etiology for these has been unclear.  They were thought  possibly to be due to diabetic autonomic dysfunction.   ALLERGIES:  The patient is reportedly allergic to MORPHINE.   MEDICATIONS:  Her current medications include:  1. Insulin.  2. Lipitor 40 mg p.o. daily.  SOCIAL HISTORY:  The patient lives with her husband.  She does not work.   PAST SURGICAL HISTORY:  She has previously undergone cholecystectomy,  hysterectomy, and appendectomy.   FAMILY HISTORY:  Her father died of brain cancer.  Her mother died of an  abdominal aortic aneurysm.   SIGNIFICANT INJURIES:  None.   REVIEW OF SYSTEMS:  Review of systems reveals no new problems related to her  head, eyes, ears, nose, mouth, throat, lungs, gastrointestinal system (other  than gastroesophageal reflux), genitourinary system, or extremities.  There  is no history of neurologic or psychiatric disorder, other than as described  above.  There is no history of fever, chills, or weight loss.   PHYSICAL EXAMINATION:  VITAL SIGNS:  Blood pressure 144/78.  Pulse 140 and  regular.  Respirations 18.  Temperature 97.3.  GENERAL:  The patient was a middle-aged white woman in no discomfort.  She  was alert, oriented, and responsive.  HEENT:  Head, eyes, nose, and mouth were normal.  NECK:  The neck was without thyromegaly or adenopathy.  Carotid  pulses were  palpable bilaterally and without bruits.  CARDIAC:  Examination revealed a normal S1 and S2.  There was no S3, S4,  murmur, rub, or gallop.  Cardiac rhythm was regular.  No chest wall  tenderness was noted.  LUNGS:  The lungs were clear.  ABDOMEN:  The abdomen was soft and nontender.  There was no mass,  hepatosplenomegaly, bruit, distention, rebound, guarding, or rigidity.  Bowel sounds were normal.  BREASTS, PELVIC AND RECTAL:  Examinations were not performed as they were  not pertinent to the reason for acute care hospitalization.  EXTREMITIES:  The extremities were without edema, deviation, or deformity.  Radial and dorsalis pedal pulses were palpable bilaterally.  NEUROLOGIC:  Brief screening neurologic survey was unremarkable.   LABORATORY AND ACCESSORY CLINICAL DATA:  The telemetry initially revealed  atrial flutter with 2:1 A-V conduction and a heart rate of 140 beats per  minute.  Later, there was evidence of intermittent atrial fibrillation.  After that, there were episodes of atrial fibrillation with a ventricular  rate in the 50s.  At the present time, her rhythm is atrial fibrillation  with a ventricular rate of 56 beats per minute.   The initial electrocardiogram revealed atrial flutter with 2:1 A-V  conduction and a ventricular rate of 132 beats per minute.  At the present  time, as discussed above, her rhythm is atrial fibrillation with a  ventricular rate of 56 beats per minute.   The chest radiograph report is pending at the time of this dictation.   Her potassium is 3.9, BUN 24, and creatinine 1.1.  Initial CK was 96 with a  CK-MB of 3.0 and troponin of 0.02.  White count was 10.1 with a hemoglobin  of 13.0 and hematocrit of 39.1.  The remaining studies were pending at the  time of this dictation.   IMPRESSION:  1. Chest pain, rule out cardiac ischemia.  2. Atrial flutter and atrial fibrillation. 3. Coronary artery disease.  Catheterization in July  2000 with results     described above.  4. Hypertension.  5. Hyperlipidemia.  6. Insulin-dependent diabetes mellitus.  7. Status post cerebrovascular accident; status post right carotid     endarterectomy.  8. Peripheral vascular disease; status post right fourth toe amputation.  9. Gastroesophageal reflux.  10.      History of recurrent syncope and mental status changes, etiology  unknown.   PLAN:  1. Coronary care unit.  2. Serial cardiac enzymes.  3. Aspirin.  4. Heparin.  5. Intravenous nitroglycerin.  6. Intravenous Diltiazem if needed for ventricular rate control.  7. Echocardiogram.  8. Further measures per Dr. Pearletha Furl. Alanda Amass.   This patient encounter was chaperoned by nurse Clippard.      Quita Skye. Waldon Reining, MD                   Richard A. Alanda Amass, M.D.    MSC/MEDQ  D:  09/08/2003  T:  09/08/2003  Job:  [id number]   cc:   Gerlene Burdock A. Alanda Amass, M.D.  [phone] [address]  [place]  [place] [address]  Fax: [phone]

## 2011-01-24 NOTE — Discharge Summary (Signed)
[place]  Patient:    Brittney [name], Brittney [name]                       MRN: [id number] Adm. Date:  [id number] Disc. Date: [id number] Attending:  McDiarmid, Leighton Roach. Dictator:   Maryelizabeth Rowan, M.D.                           Discharge Summary  DATE OF BIRTH:  04-18-1932  ADDENDUM:  Correction on insulin dosage:  She will be going home on insulin 75/25 30 units a.m., 20 units p.m.  Also, follow-up recommendations for primary care physician to include Mini-Mental Status Examination in the office.  The patient has been informed numerous times of her autonomic dysfunction secondary to her diabetes and seems to forget previous conversations.  Mini-Mental Status was not done while in the hospital and would strongly encouraged this to be done as an outpatient. DD:  08/26/00 TD:  08/27/00 Job: [id number]

## 2011-01-24 NOTE — Procedures (Signed)
[place]  Patient:    Brittney [name], Brittney [name]                       MRN: [id number] Adm. Date:  [id number] Attending:  McDiarmid, Leighton Roach CC:         PV Lab, [facility]   Procedure Report  INDICATIONS:  This is a 77 year old woman who presented with a gangrenous toe on the right foot.  She had decreased pulses on the right, and she was brought in and set up for arteriography in order to determine if she was a candidate for revascularization.  DESCRIPTION OF PROCEDURE:  The patient was taken to the PV Lab at [facility] and sedated with 1 mg of Versed and 50 mcg of fentanyl.  Both groins were prepped and draped in the usual sterile fashion.  After the skin was anesthetized with 1% lidocaine, the right common femoral artery was cannulated and a guidewire introduced into the infrarenal aorta.  A 5 French sheath was passed over the wire and the dilator removed.  Pigtail catheter was positioned at the L1 vertebral body and flush aortogram obtained.  The catheter was then repositioned above the aortic bifurcation, and an oblique iliac projection was obtained.  The pigtail catheter was then exchanged for an IMA catheter and positioned in the proximal left common iliac artery.  Left lower extremity runoff film was obtained.  Next, the IMA catheter was removed over a wire, an oblique right iliac projection was obtained and then right lower extremity runoff film obtained.  An additional film was obtained, lateral view of the right foot.  FINDINGS:  There are single renal arteries bilaterally with no significant renal artery stenosis.  There is some mild atherosclerotic disease of the infrarenal aorta with no focal stenosis.  The inferior mesenteric artery is patent.  On the left side, the left common iliac artery has some mild diffuse disease but is otherwise widely patent without any focal stenosis.  The hypogastric artery on the left is patent.  An oblique  iliac projection on the left does demonstrate some mild disease in the left common iliac artery with only a minimal 10% stenosis.  The common femoral artery is widely patent on the left. The deep femoral artery is patent.  There is mild diffuse disease of the proximal superficial femoral artery on the left.  The popliteal artery is widely patent.  There is single-vessel runoff on the left via the peroneal artery.  The anterior tibial and posterior tibial arteries are occluded.  On the right side, the right external iliac and common femoral artery are widely patent.  The deep femoral artery is patent.  There is moderate diffuse disease of the superficial femoral artery with an approximately 70% stenosis in the proximal segment.  The popliteal artery is patent.  There is severe tibial occlusive disease on the right.  All proximal vessels are occluded.  There is reconstitution of the peroneal artery on the right, which runs out to the ankle.  The posterior tibial and anterior tibial arteries are occluded.  There is reconstitution of the tarsal vessels in the right foot and reconstitution of a diseased dorsalis pedis artery.  CONCLUSIONS: 1. Mild aortoiliac occlusive disease. 2. Moderate superficial femoral artery occlusive disease bilaterally. 3. Severe tibial occlusive disease as described above, with single-vessel    runoff through the peroneal artery bilaterally. DD:  08/21/00 TD:  08/21/00 Job: [id number]

## 2011-01-24 NOTE — Op Note (Signed)
NAME:  Brittney [name], Brittney [name]                          ACCOUNT NO.:  [id number]   MEDICAL RECORD NO.:  [id number]                   PATIENT TYPE:  OIB   LOCATION:  2899                                 FACILITY:  [facility]   PHYSICIAN:  Balinda Quails, M.D.                 DATE OF BIRTH:  [birth year]   DATE OF PROCEDURE:  10/06/2003  DATE OF DISCHARGE:                                 OPERATIVE REPORT   PREOPERATIVE DIAGNOSIS:  Renal vascular hypertension.   POSTOPERATIVE DIAGNOSIS:  Renal vascular hypertension.   OPERATION PERFORMED:  Abdominal aortogram.   ACTIVITY:  Right common femoral artery 5 French sheath.   CONTRAST:  65 mL Visipaque.   COMPLICATIONS:  None apparent.   SURGEON:  Balinda Quails, M.D.   INDICATIONS FOR PROCEDURE:  Brittney [name] is a 77 year old female with  moderately severe hypertension. She recently underwent MR angiography  revealing ostial plaque at the origin of the renal arteries bilaterally with  possible high grade stenosis.  Brought to the cath lab at this time for  diagnostic arteriography and possible renal artery intervention.   DESCRIPTION OF PROCEDURE:  The patient was brought to the cath lab in stable  condition.  Placed in supine position.  Administered 2 mg of  Nubain, 1 mg  of Versed intravenously.  Skin and subcutaneous tissue in the right groin  instilled with 1% Xylocaine.  Needle was easily introduced in the right  common femoral artery.  0.035 Wholey guidewire was advanced through the  needle into the midabdominal aorta under fluoroscopy.  The needle removed  and the 5 French sheath advanced over the guidewire.  The dilator removed  and the sheath flushed with heparin saline solution.   Standard pigtail catheter then advanced over the guidewire into the  abdominal aorta.  An AP midabdominal aortogram was obtained.  This revealed  a single left renal artery, and two right renal arteries.  A smaller right  upper pole renal artery and a larger right  lower pole renal artery.  The  infrarenal aorta was widely patent.  The common iliac arteries bilaterally  were also widely patent.  Moderate plaque disease of the infrarenal aorta  and iliac vessels were noted.   The left renal artery revealed tortuosity just beyond its origin.  There was  approximately a 50% left renal artery origin stenosis.  Bilateral oblique  images of the renal arteries were obtained with intra-aortic contrast  injection.  This scan verified the left renal artery revealed approximately  50% stenosis.  The right renal artery, the right main renal artery had  approximately a 30% stenosis at its origin.  The accessory upper pole right  renal artery had also approximately a 30 to 40% stenosis.  This completed  the arteriogram procedure.  No interventions were indicated or undertaken.  The guidewire reinserted.  Pigtail catheter and guidewire removed.  Right  femoral sheath  removed.  No apparent complications.   FINAL IMPRESSION:  Moderate bilateral renal artery stenosis.  Left main  renal artery approximately 50% origin stenosis.  Right main renal artery 30%  origin stenosis.  Right upper pole accessory renal artery approximately 30  to 50% stenosis.  The patient was informed of these results.  Follow-up will  be made for the office continued medical therapy.     Staples applied to the skin.  The patient tolerated the procedure well.  Transferred to the recovery room in stable condition.  There were no  apparent complications.                                               Balinda Quails, M.D.    PGH/MEDQ  D:  10/06/2003  T:  10/06/2003  Job:  [id number]   cc:   Di Kindle. Edilia Bo, M.D.  [address]  [place]  [place] [address]

## 2011-01-24 NOTE — H&P (Signed)
NAMEANDREE, HEEG NO.:  [id number]   MEDICAL RECORD NO.:  [id number]         PATIENT TYPE:  OBV   LOCATION:                                FACILITY:  [facility]   PHYSICIAN:  [name], M.D.    DATE OF BIRTH:  March 05, 1932   DATE OF ADMISSION:  07/28/2004  DATE OF DISCHARGE:  LH                                HISTORY & PHYSICAL   CHIEF COMPLAINT:  Near syncope.   HISTORY OF PRESENT ILLNESS:  This is a very pleasant 77 year old female with  a history of type 2 diabetes mellitus, currently insulin controlled. She  also has a history of anxiety and mild dyslipidemia as well as anxiety. The  patient was seen last week in the office with episodes of near syncope of  questionable etiology. An event recorder was placed and she had done well  until experiencing a recurrent episode of syncope while showering.  Fortunately, the event recorder was activated. A read out that was obtained  upon her presentation to the emergency department revealed normal sinus  rhythm. Upon further observation, it is apparent that the patient was taking  medications that were apparently given by the [employer] trial. A full list  was not available but currently is and will be noted below. In the emergency  department, her initial evaluation was essentially benign. Her vital signs  were normal without significant orthostatic drop per emergency room  physician. She had experienced no chest pain, shortness of breath,  neurologic deficits, nausea, vomiting, diarrhea, melena, hematemesis or  hematochezia. The patient was admitted with near syncope and questionable  etiology.   CURRENT MEDICATIONS:  Zoloft 100 mg daily, aspirin 325 mg daily, Zestril 40  mg daily, Diltiazem 120 mg daily, Lipitor 40 daily, Chlorthalidone 25 daily,  Cilostazol 100 mg b.i.d., insulin 70/30 20 q. a.m. and 17 q. p.m., as well  as Lisinopril/HCTZ 20/12.5 b.i.d. (unknown as to where this medication came  from).   ALLERGIES:  MORPHINE SULFATE.   PAST MEDICAL HISTORY:  As noted above.   FAMILY HISTORY:  Noncontributory.   REVIEW OF SYSTEMS:  Negative except as mentioned.   PHYSICAL EXAMINATION:  GENERAL:  A very pleasant, fully alert female who is  in no acute distress.  VITAL SIGNS:  On presentation temperature 98.3, blood pressure 152/65, pulse  73 and regular.  HEENT:  Normocephalic, atraumatic. Pupils are equal. Ears, nose and throat  benign.  NECK:  Supple. No bruits except for a soft bruit on the left (previously  evaluated and benign).  HEART:  Sounds are normal without murmur, rub, or gallop.  ABDOMEN:  Non-tender and non-distended. Bowel sounds intact.  EXTREMITIES:  Without clubbing, cyanosis, or edema.  NEUROLOGIC:  Totally non-focal examination.   LABORATORY DATA:  Hemogram is essentially normal as are coagulation studies,  cardiac enzymes, as well as chemistries.   Chest x-ray mild cardiomegaly, negative for acute disease. CT head negative.   ASSESSMENT:  Near-syncope, questionable etiology. Her syncope is most likely  to dual diuretic as well as high dose ace inhibitor therapy, which is  possibly  inadvertant, though at this point no sequelae expected.   ADDENDUM:  Earlier this morning, the patient experienced another episode of  her symptoms. Rhythm remained stable. Her blood pressure was documented to  be approximately 110 systolic, dropping to 80 systolic with standing.   PLAN:  Will hold blood pressure medications except for Zestril. Will follow,  encourage ambulation and increase fluid intake.     Mark   MC/MEDQ  D:  07/28/2004  T:  07/28/2004  Job:  [id number]

## 2011-01-24 NOTE — Discharge Summary (Signed)
[place]  Patient:    Brittney [name], Brittney [name]                       MRN: [id number] Adm. Date:  [id number] Disc. Date: [id number] Attending:  McDiarmid, Leighton Roach. Dictator:   Maryelizabeth Rowan, M.D. CC:         Marlan Palau, M.D.  Claude Manges. Cleophas Dunker, M.D.  Denman George, M.D.  Earl Many, M.D., [facility], [place]   Discharge Summary  DATE OF BIRTH:  30-Jul-1932.  PRIMARY DIAGNOSIS:  Presyncope.  SECONDARY DIAGNOSES: 1. Diabetes mellitus. 2. Peripheral vascular disease. 3. Osteomyelitis. 4. Hypertension. 5. Hyperlipidemia. 6. Gastroesophageal reflux disease.  CONSULTANTS: 1. Marlan Palau, M.D., neurology. 2. Claude Manges. Cleophas Dunker, M.D., orthopedics. 3. Denman George, M.D., CVTS.  HOSPITAL COURSE:  #1 - PRESYNCOPE:  The patient is admitted for work-up of previous presyncopal episodes.  Dr. Anne Hahn has followed this patient extensively in the past.  He did consult during this admission and did recommend a cerebral arteriogram during this hospitalization.  The arteriogram revealed the following findings: 30% stenosis in the ____________ segment of the right internal carotid artery, cavernous segment and right carotid _________ unremarkable.  Right vertebral artery is normal and normal opacification of posterior inferior cerebellar artery.  It is felt that patient  would not be a candidate for any type of stenting procedure.  It is felt that this patients presyncopal episodes are due to autonomic dysfunction secondary to her longstanding uncontrolled diabetes mellitus.  The patient was therefore started on a trial of Florinef 0.1 mg q.d. which significantly helped these syncopal events.  The patient was able to ambulate well with physical therapy at distances of 200 feet without event.  #2 - DIABETES MELLITUS:  The patient has longstanding diabetes mellitus that has been uncontrolled.  Hemoglobin A1C obtained was 12.2.  The patients  doses of insulin were increased up to 40 units q.a.m. and 35 units q.p.m.; however, after a week of diabetic diet with sliding scale insulin, the patient then had some hypoglycemic episodes the last three mornings prior to admission.  Her 75/25 insulin was therefore decreased.  On discharge the patient will be sent home on a dose of 30 units in the a.m. and 30 units in the p.m. which will be higher than what she came on which was 26 units q.a.m. and 26 units q.p.m.  #3 - PERIPHERAL VASCULAR DISEASE:  The patient with black eschar on the fourth digit of her right foot.  One eschar was debrided.  Examination revealed palpation of bone.  Therefore, orthopedic referral was made.  The patient was evaluated by Dr. Cleophas Dunker.  He then recommended CVTS evaluation.  She did have ankle brachial indices which revealed 0.63 on the right side and 0.94 on the left.  This is a moderate reduction in arterial blood flow on the right foot consistent with her diabetic wound.  CVTS evaluated thoroughly and procedure included a right lower extremity angiogram.  Upon further evaluation, pressure on the right side was noted at 48 mmHg and therefore recommendation was made to proceed with toe amputation without revascularization; i.e. fem-pop bypass at this time.  Would recommend further evaluation for fem-pop bypass if amputation does not heal well.  The patient underwent surgical amputation of her right fourth digit on August 25, 2000, with no complications per Dr. Norlene Campbell.  #4 - HYPERTENSION:  We discontinued the Norvasc the patient had previously  been on secondary to her autonomic dysfunction and presyncopal episodes.  We will continue the Lisinopril.  #5 - HYPERLIPIDEMIA:  We did increase dosage of Zocor while the patient was in the hospital.  #6 - URINARY RETENTION:  The patient had complained of inability to empty bladder completely.  Post void residuals did reveal retention of 75 cc  of urine.  The patient was therefore started on baclofen for her urinary retention which seems to be helping her symptoms.  #7 - GASTROESOPHAGEAL REFLUX DISEASE:  Continued the patient on Protonix 40 mg p.o. q.d. without complications during this hospitalization.  DISCHARGE CONDITION:  The patient was discharged home in stable condition.  DISCHARGE MEDICATIONS: 1. Aspirin 325 mg p.o. q.d. 2. Zoloft 100 mg p.o. q.d. 3. Lisinopril 80 mg p.o. q.d. 4. Zocor 40 mg p.o. q.d. 5. Florinef 0.1 mg p.o. q.d. 6. Baclofen 5 mg t.i.d. 7. Insulin of Humalog 75/25 30 units in the a.m. and 30 units in the p.m. 8. Darvocet-N 100 one to two tablets every two hours for pain. 9. Protonix 40 mg p.o. q.d.  ACTIVITY:  Walking daily.  DIET:  Low fat diabetic diet.  FOLLOW-UP:  Home health nursing to follow up with the patient, check CBGs and follow-up diabetic education; also check blood pressure and pulse on patient visits.  Also the patient is to receive home health physical therapy and physical therapy home safety evaluation.  She will follow up with orthopedic surgeon on Monday, August 31, 2000, and with primary care physician, Earl Many, M.D., on September 09, 2000, at 9:30 a.m.   DD:  08/26/00 TD:  08/27/00 Job: [id number]

## 2011-01-24 NOTE — Op Note (Signed)
[place]. [facility]  Patient:    Brittney [name], Brittney [name]                       MRN: [id number] Proc. Date: 08/25/00 Adm. Date:  [id number] Attending:  McDiarmid, Leighton Roach.                           Operative Report  PREOPERATIVE DIAGNOSIS:  Diabetic peripheral vascular disease, right lower extremity with ischemic right fourth toe.  POSTOPERATIVE DIAGNOSIS:  Diabetic peripheral vascular disease, right lower extremity with ischemic right fourth toe.  OPERATION PERFORMED:  Amputation of right fourth toe.  SURGEON:  Claude Manges. Cleophas Dunker, M.D.  ANESTHESIA:  Ankle block and IV sedation.  COMPLICATIONS:  None.  DESCRIPTION OF PROCEDURE:  With the patient comfortable on the operating table and under mild intravenous sedation, the right lower extremity was prepped with DuraPrep from the tips of the toes to the knee. Sterile draping was performed.  Previous ankle block had been performed with excellent anesthesia to the foot.  The tip of the fourth toe was ischemic.  A  skin incision was outlined along the midportion of the proximal phalanx.  By sharp dissection, the anterior and posterior flaps were made incising skin and deep structures simultaneously. The soft tissue was then elevated from the proximal phalanx.  The metatarsophalangeal joint was identified.  The capsule was incised circumferentially thus amputating the toe.  I isolated the toe as it was ischemic and potentially infected, changed gloves and removed any instruments used up to that point.  The wound was then copiously irrigated with saline solution.  The Esmarch which had been applied along the ankle was removed and there was good capillary refill to the remaining flap.  The wound was again irrigated, the skin was closed with interrupted 4-0 Ethilon.  A sterile bulky dressing was applied followed by an Ace bandage and wooden shoe.  The patient tolerated the procedure well without complications. DD:   08/25/00 TD:  08/26/00 Job: [id number]

## 2011-01-24 NOTE — Discharge Summary (Signed)
[place]  Patient:    Brittney [name], Brittney [name] Visit Number: [id number] MRN: [id number]          Service Type: MED Location: [phone] Attending Physician:  Brittney [name] Dictated by:   Brittney [name], N.P. Admit Date:  01/08/2002 Disc. Date: 01/12/02   CC:         Brittney [name], M.D.  Brittney [name], M.D. in [place]   Discharge Summary  REASON FOR ADMISSION:  Chest discomfort in the setting of altered mental status and longstanding syncopal spells.  DISCHARGE DIAGNOSES: 1. Intermittent syncopal spells and mental status changes, etiology unclear. 2. Coronary artery disease, status post cardiac catheterization in July    2000 with 30% left main, 50-60% proximal left anterior descending, 20% mid    left anterior descending, 20-30% mid circumflex, 50% obtuse marginal, and    an ejection fraction of 60%.  Evaluated this admission with a Bruce    Cardiolite revealing no evidence of ischemia and an ejection fraction of    52%. 3. Diabetes, type 2, poor glycemic control. 4. Hyperlipidemia while on Lipitor. 5. Hypertension. 6. History of cerebrovascular accident, status post right carotid    endarterectomy. 7. Peripheral vascular disease, status post right fourth toe amputation.  HISTORY OF PRESENT ILLNESS:  This is a delightful 77 year old lady with a medical history as outlined above who presented to the ED for evaluation of a confusional episode.  She awoke feeling bad and had no memory the entire rest of the day.  She became tearful and upset when asked when she could not identify where she was, and on arrival to the emergency room, she had complained of chest pain.  She reported episodes of falling over backwards with intended weakness.  These spells were evaluated in the past and attributed to diabetic dysautonomia and improved with Florinef.  Given this complex history and her documented coronary artery disease, it  was elected prudent to admit her for further evaluation.  HOSPITAL COURSE:  The patient was evaluated with a CT of the head which demonstrated atrophy.  The patients Lasix was held given some baseline bradycardia.  An EKG was obtained and this revealed a normal sinus rhythm with some evidence of LVH.  There were no ischemic changes and a chest x-ray revealed mild cardiac enlargement and mild interstitial edema.  The patient remained chest pain-free for the duration of her stay, but did experience intermittent mild dyspnea and a feeling of "smothering."  A neuro evaluation was obtained and findings were that her episode of confusion confirmed the feeling that these spells were due to dysautonomia.  The patient underwent a treadmill Cardiolite on day #3 of admission.  She initially showed a good exercise tolerance, but began to experience claudication as well as heart rate deceleration from 106 back into the 90s at peak stress.  Images were obtained and there was no evidence of ischemia and ejection fraction was 52%.  The patient experienced several episodes of blood sugar disregulation while here, and these were adjusted with changes to her insulin regimen.  PHYSICAL EXAMINATION:  On the day of discharge, the patient offered no complaints of chest pain or dyspnea.  She was still experiencing dizzy spells on arising from the bed.  Vital signs as follows; blood pressure 111/52, pulse 64, respirations 20, and pulse oximetry 97% on room air, temperature 97.7. Telemetry revealed normal sinus rhythm.  Daily weights were up today 4.7 pounds, but this is attributed to  discontinuation of the Lasix and is seen as a correction.  General - no acute distress.  Neck - no JVD.  CV - S1 and S2.  No murmur.  Lungs clear to auscultation bilaterally.  There are no rales. Extremities are without clubbing, cyanosis, or edema.  LABORATORY DATA:  Cardiac enzymes were negative in three subsequent  sets. Lipid profile was abnormal.  Total cholesterol was 208, triglycerides 152, HDL 58, and LDL is 120.  Chemistry at discharge revealed at sodium of 140, potassium 3.6, chloride 102, CO2 31, BUN 18, creatinine 1.2, glucose high at 216.  Liver panel was within normal limits.  Hemogram on admission - WBC 10.7, hemoglobin 14.3, hematocrit 41.6, and platelets of 271.  Magnesium is 2.0. TSH and vitamin B12 are both within normal limits.  A BNP is pending.  Chest x-ray showed mild interstitial edema with mild cardiac enlargement and CT of the head was negative for acute intracranial hemorrhage or edema. Admission 12-lead EKG with normal sinus rhythm without ischemic changes.  DISPOSITION:  The patient is discharged to home on the following medications:  1. Lisinopril 40 mg one b.i.d. 2. Foltx one q.d. 3. Aspirin 325 one q.d. 4. Lipitor - the patient is asked to double her present dose given her    elevated liver labs.  She is advised not to continue with either Lasix or    Norvasc, and her insulin NPH we will change temporarily to 25 units q.a.m.    and 25 units q.p.m.  This is in view of the fact that the patients CBGs    have been running a little low at home.  ACTIVITY:  The patient is encouraged to exercise gently by walking 30 minutes every day.  DIET:  Advised is a low salt and low cholesterol diet.  SPECIAL INSTRUCTIONS:  The patient will record her blood sugars every day and will call Dr. Nobie [name] within one week of discharge and show these to him. She will keep an appointment on [name] 21, 2003, with Dr. Anne [name] of neurology. She will follow up in Dr. Daleen [name] in [place] in six months and will be called by our service to arrange that.  She knows to call in the interim with any problems, questions, concerns, or change or increase in symptoms. Dictated by:   Brittney [name], N.P. Attending Physician:  Brittney [name] DD:  01/12/02 TD:  01/12/02 Job: [id number]

## 2011-01-24 NOTE — Discharge Summary (Signed)
NAME:  Brittney [name], Brittney [name]                          ACCOUNT NO.:  [id number]   MEDICAL RECORD NO.:  [id number]                   PATIENT TYPE:  INP   LOCATION:  4705                                 FACILITY:  [facility]   PHYSICIAN:  Richard A. Alanda Amass, M.D.          DATE OF BIRTH:  10/18/1931   DATE OF ADMISSION:  09/08/2003  DATE OF DISCHARGE:  09/14/2003                                 DISCHARGE SUMMARY   ADMISSION DIAGNOSES:  1. Chest pain, rule out myocardial infarction.  2. Atrial fibrillation/flutter.  3. History of cardiac catheterization July 2000.  4. Hypertension.  5. Adult-onset diabetes mellitus, insulin-dependent.  6. Status post cerebrovascular accident, status post right carotid     endarterectomy.  7. Peripheral vascular disease, status post right 4th toe amputation.  8. History of gastroesophageal reflux.  9. Recurrent syncope and mental changes, etiology unknown.   DISCHARGE DIAGNOSES:  1. Chest pain, rule out myocardial infarction.  2. Atrial fibrillation/flutter.  3. History of cardiac catheterization July 2000.  4. Hypertension.  5. Adult-onset diabetes mellitus, insulin-dependent.  6. Status post cerebrovascular accident, status post right carotid     endarterectomy.  7. Peripheral vascular disease, status post right 4th toe amputation.  8. History of gastroesophageal reflux.  9. Recurrent syncope and mental changes, etiology unknown.   PROCEDURES:  Cardiac catheterization September 11, 2003, Dr. Cristy Hilts. Ganji.   BRIEF HISTORY:  The patient is a 77 year old white female transferred from  [facility] and admitted by Dr. Quita Skye. Collman for Dr. Pearletha Furl. Alanda Amass.  The patient states she had chest pain earlier in the evening  while doing laundry.  The chest pain was described as a pressure in the left  anterior chest and radiates to the left arm.  It is associated with mild  dyspnea, diaphoresis, and nausea.  There are no exacerbating or  ameliorating  factors.  It appeared not to be related to position, activity, meals, or  respirations.  It was relieved somewhat with nitroglycerin which was given  in the emergency room at [facility].  She subsequently developed  tachycardia.  It was felt to be a supraventricular tachycardia.  Various  drugs were used to control her heart rate, which got up to 140-150 at [facility].  The patient has a history of previous cardiac  catheterization July 2000.  This demonstrated 30% left main, 50-60% proximal  LAD, a 20% mid LAD, and a 20-30% mid circumflex lesion.  There was a 50% OM  lesion.  Ejection fraction was 60%.  On her most recent hospitalization in  May 2003 she had a Cardiolite stress test which demonstrated ejection  fraction of 52% with no ischemia.  There was no history of congestive heart  failure or arrhythmia.  Additional medical problems include insulin-  dependent diabetes, hypertension, hyperlipidemia.  The patient also has  peripheral vascular disease, has undergone a right 4th  toe amputation and  right carotid endarterectomy.  The patient has a history of syncopal spells  and mental status changes; the etiology is unclear.   ALLERGIES:  MORPHINE.   MEDICATIONS ON ADMISSION:  Insulin and Lipitor.   For further history and physical please see the dictated note.   HOSPITAL COURSE:  The patient's telemetry on admission showed atrial flutter  with 2:1 AV conduction, a heart rate of 140.  There was later evidence of  intermittent atrial fibrillation with ventricular rates in the 50s.  At the  present time on arrival her rhythm was atrial fibrillation with rate of 56.  Her initial laboratories were normal with CKs and troponins being negative.  The patient was admitted, placed on heparin, aspirin, IV nitroglycerin, and  IV Cardizem for rate control as needed.  She was also placed on insulin with  a sliding scale for her diabetic control.  Her CKs and  troponins were all  negative.  Her atrial fibrillation converted to a sinus rhythm.  She  underwent cardiac catheterization on September 11, 2003, by Dr. Cristy Hilts. Ganji.  This showed a 50% midstenosis at the RCA, heavy calcification suspicious for  a high-grade left main.  The patient had an LAD showing 30-40% stenosis  proximally and a 30% second lesion in the proximal LAD.  The circumflex was  normal with a 30% stenosis at the ostium of the first OM.  Ejection fraction  was 65%.  There was no significant MR.  IVUS study of the left main ostium  area shows heavily calcified concentric stenosis of about 40% or less.  There was a 2.9-mm x 4.7-mm lumen.  It was Dr. Cristy Hilts. Ganji's opinion that  the patient could be maintained on medical management.  The patient's  medications were adjusted, and she developed some bradycardia, and her beta  blockers were decreased.  Her lipids showed ongoing dyslipidemia with  cholesterol of 165, HDL of 52, LDL of 99, and triglycerides of 89.  She was  stabilized.  Her bradycardia improved with a decrease in her beta blockers,  and by September 14, 2003, it was Dr. Chrisandra Carota opinion the patient was  ready for discharge home.  She is discharged home on:  1. Aspirin 81 mg, 2 daily.  2. Lipitor 80 mg daily.  3. Plavix 75 mg daily.  4. Cardizem 120 mg daily.  5. Zetia 10 mg daily.  6. Insulin 70/30, 20 units q.a.m.  7. Lopressor 12.5 mg b.i.d., and she is to hold it for a heart rate of less     than 70.   She is to take her medicines at 11 a.m. and 11 p.m.   She is also on Altace 2.5 mg, 1 at bedtime.   She will follow up with Dr. Pearletha Furl. Alanda Amass on September 25, 2003, at  2:45 p.m.   DISCHARGE ACTIVITY:  Light to moderate.  No lifting over 10 pounds, no  driving, no strenuous activity.   CONDITION ON DISCHARGE:  Improving.      Eber Hong, P.A.                 Richard A. Alanda Amass, M.D.   WDJ/MEDQ  D:  10/03/2003  T:  10/04/2003  Job:   [id number]

## 2011-01-24 NOTE — Discharge Summary (Signed)
NAMEFREDA, JAQUITH NO.:  [id number]   MEDICAL RECORD NO.:  [id number]          PATIENT TYPE:  OBV   LOCATION:  A227                          FACILITY:  [facility]   PHYSICIAN:  [name], M.D.    DATE OF BIRTH:  1931/11/22   DATE OF ADMISSION:  07/27/2004  DATE OF DISCHARGE:  11/21/2005LH                                 DISCHARGE SUMMARY   DISCHARGE DIAGNOSES:  1.  Near syncope, secondary to medication-induced hypotension.  2.  Diabetes mellitus.   For details regarding admission, please refer to the admitting report.   Briefly, this 77 year old female with a history of diabetes and hypertension  presented to the office several days prior to admission complaining of near  syncopal episodes.  At that time her physical exam was unremarkable and she  had no orthostatic hypotension.  A one week event recorder was attached to  the patient.  She went home and had no further episodes until just prior to  this admission when she was in the shower.  She had near syncope without  chest pain, shortness of breath, palpitations or other symptoms.  Her blood  sugar at the time was okay.  She was brought to the emergency room for  further evaluation.   In the emergency department we were able to obtain the tracing from the  event recorder, which was totally benign.   The patient was admitted for near syncopal episodes.   COURSE IN THE HOSPITAL:  The patient did well in the hospital.  She was  placed on __________ and hydrated prudently.  Her insulin was changed to  70/30 on split dose of 18/12.  She did have some hyperglycemia initially as  she had been changed to Novolin by other physicians.   She had one episode of near syncope.  Her blood pressure was documented to  be 100 systolic dropping to 80 systolic while standing.   Further review of her medications reveal that she had been taking  lisinopril, chlorthalidone as well as Zestril and hydrochlorothiazide.  These  medications were held and currently she is doing very well without  further problems.  Her blood pressure is stable without orthostasis and she  is stable for discharge.   DISPOSITION:  1.  Medications will include:      1.  Zestril 10 mg q.d.      2.  Insulin 70/30 18 units q.a.m. and 12 units q.p.m. and she will          monitor herself very closely.  2.  She will push fluids and be followed and treated expectantly as an      outpatient.     Mark   MC/MEDQ  D:  07/29/2004  T:  07/29/2004  Job:  [id number]

## 2011-01-24 NOTE — Cardiovascular Report (Signed)
NAME:  NADA, GODLEY                          ACCOUNT NO.:  [id number]   MEDICAL RECORD NO.:  [id number]                   PATIENT TYPE:  INP   LOCATION:  4705                                 FACILITY:  [facility]   PHYSICIAN:  Cristy Hilts. Jacinto Halim, M.D.                  DATE OF BIRTH:  June 08, 1932   DATE OF PROCEDURE:  09/11/2003  DATE OF DISCHARGE:                              CARDIAC CATHETERIZATION   PROCEDURE PERFORMED:  1. Left ventriculography.  2. Selective right and left coronary arteriography.  3. IVUS interrogation of the left main coronary artery.   INDICATION:  Ms. Britany Callicott is a 77 year old Caucasian female with history  of peripheral vascular disease status post right carotid endarterectomy and  left fourth toe amputation in the past, history of hyperlipidemia who was  admitted to the hospital with new onset of atrial fibrillation and chest  pain.  Myocardial infarction was ruled out and because of her significant  cardiac risk factors and new onset of chest pain which was suggestive of  unstable angina, she was brought to the cardiac catheterization lab to  evaluate her coronary anatomy.  She also has history of diabetes and history  of stroke in the past.   HEMODYNAMIC DATA:  1. The left ventricular pressures were 174/16 with end-diastolic pressure of     22 mmHg.  2. Aortic pressure 180/65 with a mean of 105 mmHg.  3. There was no significant pressure gradient across the aortic valve.   ANGIOGRAPHIC DATA:  Left ventricle:  Left ventricular systolic function was  normal with ejection fraction being estimated at 65%.  There is no  significant mitral regurgitation.   Right coronary artery:  The right coronary artery is a small nondominant  artery.  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  The mid segment has 50% stenosis.   Left main coronary artery:  Left main coronary artery is a large caliber  vessel. Severely calcified.  The ostium of the  left main appears to have  angiographically suspicious high grade stenosis constituting 90% stenosis,  but in other views it appeared to be widely patent with 30-40% stenosis.   Left anterior descending artery:  Left anterior descending artery is again  large caliber vessel and is heavily calcified.  Proximal LAD has 30, at most  40% stenosis, smooth and mid has 30% stenosis, smooth and mild luminal  irregularity.   Circumflex coronary artery:  Circumflex coronary artery is a large caliber  vessel and a dominant vessel.  It gives origin to large obtuse marginal one,  moderate size OM-2, large OM-3 and distal circumflex gives PDA branches.  The OM-1 has ostial 30% calcific stenosis.  Otherwise, the circumflex itself  has mild luminal irregularity.   IMPRESSION:  1. Nondominant small right coronary artery with mid 50% stenosis.  Has an     anterior and left superior take  off from the aorta.  The artery engaged     by AL-1 catheter.  2. Left main coronary artery is severely calcified and has a suspicious high     grade ostial stenosis in some views and 30-40% stenosis in other views.  3. Mild noncritical coronary artery disease of the left anterior descending     and circumflex.   IVUS DATA:  The left main is heavily calcified.  The lumen had mild luminal  irregularity in the distal and mid segment.  The proximal and ostial segment  had an eccentric heavy calcification.  The lumen measured 2.9 mm x 4.7-mm  with approximately 30-40% luminal obstruction.   IMPRESSION:  1. Angiograms concerning for left main disease.  However, IVUS does not show     significant luminal obstruction.  Will compare with the old films.  The     etiology for chest pain could be secondary to a plaque rupture.  Will     also discuss with colleagues regarding further management of the patient.  2. Very aggressive risk factor modification is indicated including     aggressive control of hypertension,  hyperlipidemia.   TECHNIQUE OF PROCEDURE:  Under usual sterile precautions, using a 6 French  right femoral arterial access, a 6 [place] multipurpose B2 catheter was  advanced into the ascending aorta over a 0.035-inch J wire.  The catheter  was then gently advanced to the left ventricle and left ventricular  pressures were monitored.  Hand contrast injection of the left ventricle was  performed both in the LAO and RAO projection.  The catheter was flushed with  saline and pulled back into the ascending aorta and pressure gradient across  the aortic valve was monitored.  Then, the catheter was pulled out of the  body and a 6 [place] Judkins left catheter was utilized to engage the left  main coronary artery and angiography was performed.  Then, a Judkins right 4  catheter was utilized and because of inability to engage the RCA, an AL-1  catheter was utilized and the anomalous RCA was easily engaged and  angiography was performed. Then, the catheter was pulled out of the body.   TECHNIQUE OF IVUS INTERROGATION:  Using 4000 units of heparin and  maintaining ACT at greater than 200, an 180 cm x 0.014-inch Luge guide wire  was utilized to cross into the circumflex coronary artery and the tip of the  wire was carefully positioned in the distal circumflex coronary artery.  Then, an Atlantis Pro IVUS catheter was utilized to cross into the left main  coronary artery.  For engaging the left main, a 6 [place] JL-4 guide was  utilized.  There was no damping noted with engagement of the guide catheter.  IVUS catheter was easily introduced without any symptoms from the patient or  hemodynamic compromise.  IVUS was performed and the data was carefully  analyzed.  After analysis, the guide wire and IVUS catheter was withdrawn  out of the body and angiography was repeated.  Then, the guide catheter was disengaged from the left main coronary artery and pulled out of the body in  the usual fashion.  The  patient tolerated the procedure well.                                               Cristy Hilts. Jacinto Halim,  M.D.    JRG/MEDQ  D:  09/11/2003  T:  09/11/2003  Job:  [id number]   cc:   Nicki Guadalajara, M.D.  [phone] [address]  [place], [place] [address]  Fax: [phone]

## 2011-01-24 NOTE — Cardiovascular Report (Signed)
Brittney [name], GILBERTO NO.:  [id number]   MEDICAL RECORD NO.:  [id number]          PATIENT TYPE:  OIB   LOCATION:  2860                         FACILITY:  [facility]   PHYSICIAN:  Cristy Hilts. Jacinto Halim, MD       DATE OF BIRTH:  07-Sep-1932   DATE OF PROCEDURE:  02/25/2005  DATE OF DISCHARGE:                              CARDIAC CATHETERIZATION   PROCEDURE:  Peripheral angiography and angioplasty.   PROCEDURE PERFORMED:  1.  Abdominal aortogram.  2.  Selective left renal arteriography.  3.  PTA and stenting of the left renal artery.   INDICATION:  Ms. Brittney [name] is a 77 year old female with history of  hypertension, hyperlipidemia, coronary artery disease with prior stroke.  Has had difficulty in controlling her hypertension despite of aggressive  medical therapy.  Given this, also because of her chest discomfort, she had  undergone diagnostic cardiac catheterization at [facility] on  Jan 28, 2005 and it revealed a high grade 80-90% left renal artery stenosis.  Given this, she was brought to the 6th floor catheterization suite for left  renal artery PTA and stenting.   ANGIOGRAPHIC DATA:   ABDOMINAL AORTOGRAM:  Revealed presence of two renal arteries; one on either  side.  The right renal artery was widely patent.  Left renal artery, again,  showed high-grade stenosis above 80%.  The celiac and superior mesenteric  trunk was widely patent.  There was no evidence of abdominal aortic  aneurysm.   LEFT RENAL ARTERIOGRAPHY:  Left renal arteriography confirmed the presence  of high-grade stenosis.   INTERVENTION DATA:  Successful direct stenting of the left renal artery with  a 6.0 x 12-mm Genesis stent on an Opta balloon, deployed at 12 atmospheric  pressure.  The stenosis was reduced from 80% to 0%.  Excellent TIMI-3 flow  was maintained in the left renal artery.   Overall 50 mL of contrast was utilized for diagnostic and interventional  procedure.   RECOMMENDATIONS:  The patient will be continued on aggressive medical  therapy.  She will be watched overnight for the effects of blood pressure  status post left renal artery stenting, and if she remains stable she will  be discharged home in the morning.  She will need outpatient renal duplex  for surveillance.   TECHNIQUE OF PROCEDURE:  Under usual sterile precautions, using a 6 French  right femoral arterial access, a 6 [place] JR-4 guide was advanced to the  abdominal aorta.  Abdominal aortogram was performed.  Then, using road map  and non-touch technique, a 0.014-inch stabilizer wire was advanced into the  left renal artery.  Then, the guide catheter was manipulated and engaged in  the ostium of the left renal artery.  Then, a 6.0 x 12-mm Genesis on Aviator  balloon was advanced into the left renal artery, and after confirming the  placement of the stent the stent was deployed at 12 atmospheric pressure for  45 seconds.  The balloon was withdrawn outside of the ostium and a second  inflation at 10 atmospheric pressure for 30 seconds was  performed.  Then,  the balloon was withdrawn out of the body and angiography was repeated.  Excellent results were noted.  After confirming excellent results, the guide  catheter was pulled out of the body over a 0.035-inch J wire.  During the  procedure a total of 6000 units of heparin was administered.  ACT was  maintained therapeutic range.  At the end of the procedure the ACT was 223.  There were no immediate complications noted.       JRG/MEDQ  D:  02/25/2005  T:  02/25/2005  Job:  [id number]   cc:   Dani Gobble, MD  Fax: [phone]   [name], M.D.  [address], [address]  [place]  [place] [address]  Fax: [phone]

## 2011-04-04 ENCOUNTER — Other Ambulatory Visit: Payer: Self-pay | Admitting: Cardiology

## 2011-04-04 MED ORDER — FUROSEMIDE 40 MG PO TABS: 40.0000 mg | ORAL_TABLET | Freq: Every day | ORAL | Status: DC

## 2011-04-04 NOTE — Telephone Encounter (Signed)
.   Requested Prescriptions   Pending Prescriptions Disp Refills  . FUROSEMIDE 40 MG PO TABS 30 tablet 6    Sig: Take 1 tablet (40 mg total) by mouth daily.

## 2011-04-10 ENCOUNTER — Encounter: Payer: Self-pay | Admitting: Cardiology

## 2011-04-21 ENCOUNTER — Ambulatory Visit (INDEPENDENT_AMBULATORY_CARE_PROVIDER_SITE_OTHER): Payer: Medicare Other | Admitting: Cardiology

## 2011-04-21 ENCOUNTER — Encounter: Payer: Self-pay | Admitting: Cardiology

## 2011-04-21 DIAGNOSIS — I2510 Atherosclerotic heart disease of native coronary artery without angina pectoris: Secondary | ICD-10-CM

## 2011-04-21 DIAGNOSIS — I4891 Unspecified atrial fibrillation: Secondary | ICD-10-CM

## 2011-04-21 DIAGNOSIS — I951 Orthostatic hypotension: Secondary | ICD-10-CM

## 2011-04-21 NOTE — Assessment & Plan Note (Signed)
Coronary disease is stable.  No further workup. 

## 2011-04-21 NOTE — Patient Instructions (Signed)
Your physician wants you to follow-up in: 6 months. You will receive a reminder letter in the mail one-two months in advance. If you don't receive a letter, please call our office to schedule the follow-up appointment. Your physician recommends that you continue on your current medications as directed. Please refer to the Current Medication list given to you today. 

## 2011-04-21 NOTE — Assessment & Plan Note (Signed)
She has not had any problems with orthostatic hypotension.  She's stable.  No further workup.  Plan six-month followup.

## 2011-04-21 NOTE — Progress Notes (Signed)
HPI Patient is seen for followup of atrial fibrillation.  She is doing great.  She's not having palpitations.  She is not having any orthostatic symptoms.  We had a careful discussion today and she has decided she does not want to resume Coumadin. Allergies  Allergen Reactions  . Morphine Sulfate     REACTION: jerking  . Oxycodone Hcl     Current Outpatient Prescriptions  Medication Sig Dispense Refill  . Ascorbic Acid (VITAMIN C) 1000 MG tablet Take 1,000 mg by mouth daily.        [name] aspirin 81 MG tablet Take 162 mg by mouth daily.        . furosemide (LASIX) 40 MG tablet Take 1 tablet (40 mg total) by mouth daily.  30 tablet  6  . insulin aspart (NOVOLOG) 100 UNIT/ML injection Inject into the skin as directed. Per sliding scale       . insulin glargine (LANTUS) 100 UNIT/ML injection Inject 15 Units into the skin at bedtime.        [name] levothyroxine (SYNTHROID, LEVOTHROID) 75 MCG tablet Take 75 mcg by mouth daily.        . midodrine (PROAMATINE) 5 MG tablet Take 1 tablet (5 mg total) by mouth 2 (two) times daily.  60 tablet  4  . pyridOXINE (VITAMIN B-6) 100 MG tablet Take 100 mg by mouth daily.        . sertraline (ZOLOFT) 100 MG tablet Take 100 mg by mouth daily.        . travoprost, benzalkonium, (TRAVATAN) 0.004 % ophthalmic solution Place 1 drop into both eyes at bedtime.        [name] acetaminophen (TYLENOL) 500 MG tablet Take 500 mg by mouth every 6 (six) hours as needed.        . nitroGLYCERIN (NITROSTAT) 0.4 MG SL tablet Place 0.4 mg under the tongue every 5 (five) minutes as needed.          History   Social History  . Marital Status: Widowed    Spouse Name: N/A    Number of Children: 3  . Years of Education: N/A   Occupational History  . RETIRED    Social History Main Topics  . Smoking status: Never Smoker   . Smokeless tobacco: Not on file  . Alcohol Use: No  . Drug Use: No  . Sexually Active: Not on file   Other Topics Concern  . Not on file   Social History  Narrative   Gets regular exercise.    Family History  Problem Relation Age of Onset  . Cancer Brother     colon cancer  . Aortic aneurysm Mother   . Colon polyps Neg Hx   . Liver disease Neg Hx     And no CRC    Past Medical History  Diagnosis Date  . Hypertension   . Dyslipidemia   . CAD (coronary artery disease)     Non-STEMI August, 2010,,,, 40% left main, 30% LAD, 60% OM 2, 40% RCA, 90% OM1-culprit lesion-too small for intervention           . Ejection fraction     EF 55% ,2011 / EF 30% with tachycardia cardiomyopathy / EF 50% January, 2012  . Pacemaker     St. Jude, September, 2010  . Diabetes mellitus   . Dementia     ?? Early dementia ?? 2012  . GERD (gastroesophageal reflux disease)   . TIA (transient ischemic attack)  History of TIAs  . Hypothyroidism   . Carotid artery disease     Carotid endarterectomy, right  . PAD (peripheral artery disease)     Stents right peroneal, right superficial, right popliteal arteries, Dr. Edilia Bo  . Amputation     Right midfoot 2010  . CKD (chronic kidney disease)     Creatinine 1.4 hospital discharge January 2 012  . Gait disorder   . Vertebrobasilar insufficiency   . Depression   . Hyperkalemia     ACE Inhibitor held  . Acute renal insufficiency     Catheterization August 2 010  . Blindness of right eye   . Respiratory failure     Hypoxic and hypercapnic made, 2011, etiology pneumonia  . Leukocytosis 5/11    Persistent post pneumonia  . Orthostatic hypotension     Syncope January 2012 with facial trauma, amiodarone stopped  . Tachycardia induced cardiomyopathy   . Atrial fibrillation 8/10    Coumadin use in the past.  Patient decided August, 2012,That she did not want Coumadin restart  /    Rapid rate; Coumadin started TE vardioversion done 04/30/09; amiodarone, beta blockers during hospitalization 8/10; beta blockers lower blood pressure excessively without heart rate control  . Ventricular tachycardia     20 beats  December, 2012, asymptomatic  . Atrial flutter 05/24/09    Hospitalization in [place]; with difficult rate control; TEE cardioversion done EF 30%, secondary to tachycardia, pt did convert back tosinus rhythm; return 10/11, amiodarone restarted 10/11  . Bradycardia, severe sinus 05/31/09    [facility]; cardioversion, chest compressions, pacemake placed; amiodarone used until stopped 5/11  . Anemia in CKD (chronic kidney disease)   . Gastroparesis 3/11    Abnormal GES    Past Surgical History  Procedure Date  . Total abdominal hysterectomy   . Cholecystectomy   . Carotid endarterectomy   . Amputation 2010    RIGHT MIDFOOT  . Tonsillectomy   . Eye surgeries     Several right  . Pacemaker insertion     SJM Pacemaker implant 9/10  . Pci stent to the right peroneal and right superficial as well as right popliteal arteries     Dr. Edilia Bo    ROS  Patient denies fever, chills, headache, sweats, rash, change in vision, change in hearing, chest pain, cough, nausea vomiting, urinary symptoms.  All other systems are reviewed and are negative.  PHYSICAL EXAM Patient looks great today.  She is oriented to person time and place.  Affect is normal.  She is here with her daughter.  There is no xanthelasma.  There is no jugulovenous distention.  Lungs are clear.  Respiratory effort is nonlabored.  Cardiac exam reveals S1-S2.  No clicks or significant murmurs.  The abdomen is soft.  There is no peripheral edema. Filed Vitals:   04/21/11 0916  BP: 151/72  Pulse: 62  Height: 5\' 7"  (1.702 m)  Weight: 164 lb (74.39 kg)  SpO2: 96%    EKG Is not done today.  ASSESSMENT & PLAN

## 2011-04-21 NOTE — Assessment & Plan Note (Signed)
Patient is not having any current atrial fibrillation.  She decided she does not want Coumadin resumed.  No further workup.

## 2011-05-15 ENCOUNTER — Encounter: Payer: Self-pay | Admitting: *Deleted

## 2011-07-02 ENCOUNTER — Other Ambulatory Visit: Payer: Self-pay | Admitting: Cardiology

## 2011-07-14 ENCOUNTER — Ambulatory Visit (HOSPITAL_COMMUNITY)
Admission: RE | Admit: 2011-07-14 | Discharge: 2011-07-14 | Disposition: A | Discharge status: Home / Self Care | Payer: Medicare Other | Source: Ambulatory Visit | Attending: Internal Medicine | Admitting: Internal Medicine

## 2011-07-14 ENCOUNTER — Other Ambulatory Visit (HOSPITAL_COMMUNITY): Payer: Self-pay | Admitting: Internal Medicine

## 2011-07-14 DIAGNOSIS — M25519 Pain in unspecified shoulder: Secondary | ICD-10-CM

## 2011-08-09 HISTORY — PX: OTHER SURGICAL HISTORY: SHX169

## 2011-08-13 ENCOUNTER — Inpatient Hospital Stay (HOSPITAL_COMMUNITY)
Admission: EM | Admit: 2011-08-13 | Discharge: 2011-08-15 | DRG: 310 | Disposition: A | Discharge status: Home / Self Care | Payer: Medicare Other | Attending: Cardiology | Admitting: Cardiology

## 2011-08-13 ENCOUNTER — Other Ambulatory Visit: Payer: Self-pay

## 2011-08-13 ENCOUNTER — Emergency Department (HOSPITAL_COMMUNITY): Payer: Medicare Other

## 2011-08-13 ENCOUNTER — Encounter (HOSPITAL_COMMUNITY): Payer: Self-pay | Admitting: Oncology

## 2011-08-13 DIAGNOSIS — I509 Heart failure, unspecified: Secondary | ICD-10-CM

## 2011-08-13 DIAGNOSIS — I4891 Unspecified atrial fibrillation: Principal | ICD-10-CM | POA: Diagnosis present

## 2011-08-13 DIAGNOSIS — I2510 Atherosclerotic heart disease of native coronary artery without angina pectoris: Secondary | ICD-10-CM | POA: Diagnosis present

## 2011-08-13 DIAGNOSIS — E039 Hypothyroidism, unspecified: Secondary | ICD-10-CM | POA: Diagnosis present

## 2011-08-13 DIAGNOSIS — E119 Type 2 diabetes mellitus without complications: Secondary | ICD-10-CM | POA: Diagnosis present

## 2011-08-13 DIAGNOSIS — S98919A Complete traumatic amputation of unspecified foot, level unspecified, initial encounter: Secondary | ICD-10-CM

## 2011-08-13 DIAGNOSIS — Z794 Long term (current) use of insulin: Secondary | ICD-10-CM

## 2011-08-13 DIAGNOSIS — N183 Chronic kidney disease, stage 3 unspecified: Secondary | ICD-10-CM | POA: Diagnosis present

## 2011-08-13 DIAGNOSIS — I252 Old myocardial infarction: Secondary | ICD-10-CM

## 2011-08-13 DIAGNOSIS — D631 Anemia in chronic kidney disease: Secondary | ICD-10-CM | POA: Diagnosis present

## 2011-08-13 DIAGNOSIS — I951 Orthostatic hypotension: Secondary | ICD-10-CM

## 2011-08-13 DIAGNOSIS — Z950 Presence of cardiac pacemaker: Secondary | ICD-10-CM

## 2011-08-13 DIAGNOSIS — Z7901 Long term (current) use of anticoagulants: Secondary | ICD-10-CM

## 2011-08-13 DIAGNOSIS — Z7982 Long term (current) use of aspirin: Secondary | ICD-10-CM

## 2011-08-13 DIAGNOSIS — H5440 Blindness, one eye, unspecified eye: Secondary | ICD-10-CM | POA: Diagnosis present

## 2011-08-13 DIAGNOSIS — N039 Chronic nephritic syndrome with unspecified morphologic changes: Secondary | ICD-10-CM | POA: Diagnosis present

## 2011-08-13 DIAGNOSIS — J069 Acute upper respiratory infection, unspecified: Secondary | ICD-10-CM | POA: Diagnosis present

## 2011-08-13 DIAGNOSIS — I4892 Unspecified atrial flutter: Secondary | ICD-10-CM

## 2011-08-13 DIAGNOSIS — Z8673 Personal history of transient ischemic attack (TIA), and cerebral infarction without residual deficits: Secondary | ICD-10-CM

## 2011-08-13 LAB — COMPREHENSIVE METABOLIC PANEL
ALT: 12 U/L (ref 0–35)
AST: 18 U/L (ref 0–37)
Alkaline Phosphatase: 104 U/L (ref 39–117)
CO2: 29 mEq/L (ref 19–32)
Chloride: 99 mEq/L (ref 96–112)
Creatinine, Ser: 1.03 mg/dL (ref 0.50–1.10)
GFR calc non Af Amer: 51 mL/min — ABNORMAL LOW (ref >90)
Sodium: 138 mEq/L (ref 135–145)
Total Bilirubin: 0.5 mg/dL (ref 0.3–1.2)

## 2011-08-13 LAB — CBC
MCV: 89.2 fL (ref 78.0–100.0)
Platelets: 287 10*3/uL (ref 150–400)
RBC: 4.17 MIL/uL (ref 3.87–5.11)
WBC: 10.4 10*3/uL (ref 4.0–10.5)

## 2011-08-13 LAB — APTT: aPTT: 36 seconds (ref 24–37)

## 2011-08-13 LAB — PROTIME-INR
INR: 1.06 (ref 0.00–1.49)
Prothrombin Time: 14 seconds (ref 11.6–15.2)

## 2011-08-13 MED ORDER — INSULIN GLARGINE 100 UNIT/ML ~~LOC~~ SOLN
10.0000 [IU] | Freq: Every day | SUBCUTANEOUS | Status: DC
  Administered 2011-08-13 – 2011-08-14 (×2): 10 [IU] via SUBCUTANEOUS
  Filled 2011-08-13 (×2): qty 3

## 2011-08-13 MED ORDER — DILTIAZEM HCL 100 MG IV SOLR
5.0000 mg/h | Freq: Once | INTRAVENOUS | Status: AC
  Administered 2011-08-13: 5 mg/h via INTRAVENOUS
  Filled 2011-08-13: qty 100

## 2011-08-13 MED ORDER — SODIUM CHLORIDE 0.9 % IV SOLN: INTRAVENOUS | Status: DC

## 2011-08-13 MED ORDER — DILTIAZEM HCL 100 MG IV SOLR
5.0000 mg/h | INTRAVENOUS | Status: DC
  Administered 2011-08-13: 15 mg/h via INTRAVENOUS
  Administered 2011-08-14: 20 mg/h via INTRAVENOUS
  Filled 2011-08-13: qty 100

## 2011-08-13 MED ORDER — VITAMIN B-6 100 MG PO TABS
100.0000 mg | ORAL_TABLET | Freq: Every day | ORAL | Status: DC
Start: 2011-08-14 — End: 2011-08-15
  Administered 2011-08-14 – 2011-08-15 (×2): 100 mg via ORAL
  Filled 2011-08-13 (×2): qty 1

## 2011-08-13 MED ORDER — ACETAMINOPHEN 650 MG RE SUPP: 650.0000 mg | Freq: Four times a day (QID) | RECTAL | Status: DC | PRN

## 2011-08-13 MED ORDER — TRAVOPROST (BAK FREE) 0.004 % OP SOLN
1.0000 [drp] | Freq: Every day | OPHTHALMIC | Status: DC
  Administered 2011-08-13 – 2011-08-14 (×2): 1 [drp] via OPHTHALMIC
  Filled 2011-08-13 (×2): qty 2.5

## 2011-08-13 MED ORDER — MAGNESIUM HYDROXIDE 400 MG/5ML PO SUSP: 30.0000 mL | Freq: Every day | ORAL | Status: DC | PRN

## 2011-08-13 MED ORDER — ONDANSETRON HCL 4 MG/2ML IJ SOLN: 4.0000 mg | Freq: Four times a day (QID) | INTRAMUSCULAR | Status: DC | PRN

## 2011-08-13 MED ORDER — FUROSEMIDE 10 MG/ML IJ SOLN
40.0000 mg | Freq: Once | INTRAMUSCULAR | Status: AC
  Administered 2011-08-13: 40 mg via INTRAVENOUS
  Filled 2011-08-13: qty 4

## 2011-08-13 MED ORDER — DILTIAZEM HCL 25 MG/5ML IV SOLN
20.0000 mg | Freq: Once | INTRAVENOUS | Status: AC
  Administered 2011-08-13: 20 mg via INTRAVENOUS

## 2011-08-13 MED ORDER — SERTRALINE HCL 100 MG PO TABS
100.0000 mg | ORAL_TABLET | Freq: Every day | ORAL | Status: DC
  Administered 2011-08-14 – 2011-08-15 (×2): 100 mg via ORAL
  Filled 2011-08-13 (×2): qty 1

## 2011-08-13 MED ORDER — SODIUM CHLORIDE 0.9 % IV SOLN
20.0000 mL | INTRAVENOUS | Status: DC
  Administered 2011-08-13: 14:00:00 via INTRAVENOUS

## 2011-08-13 MED ORDER — HEPARIN SOD (PORCINE) IN D5W 100 UNIT/ML IV SOLN
1100.0000 [IU]/h | INTRAVENOUS | Status: DC
  Administered 2011-08-13: 1000 [IU]/h via INTRAVENOUS
  Filled 2011-08-13 (×2): qty 250

## 2011-08-13 MED ORDER — SODIUM CHLORIDE 0.9 % IJ SOLN
3.0000 mL | Freq: Two times a day (BID) | INTRAMUSCULAR | Status: DC
  Administered 2011-08-13 – 2011-08-15 (×4): 3 mL via INTRAVENOUS

## 2011-08-13 MED ORDER — SODIUM CHLORIDE 0.9 % IJ SOLN: 3.0000 mL | INTRAMUSCULAR | Status: DC | PRN

## 2011-08-13 MED ORDER — VITAMIN C 500 MG PO TABS: 1000.0000 mg | ORAL_TABLET | Freq: Every day | ORAL | Status: DC

## 2011-08-13 MED ORDER — ALUM & MAG HYDROXIDE-SIMETH 200-200-20 MG/5ML PO SUSP: 15.0000 mL | ORAL | Status: DC | PRN

## 2011-08-13 MED ORDER — PRAMOXINE HCL 1 % RE OINT
1.0000 "application " | TOPICAL_OINTMENT | Freq: Three times a day (TID) | RECTAL | Status: DC | PRN
  Filled 2011-08-13: qty 30

## 2011-08-13 MED ORDER — INSULIN ASPART 100 UNIT/ML ~~LOC~~ SOLN
0.0000 [IU] | Freq: Three times a day (TID) | SUBCUTANEOUS | Status: DC
  Filled 2011-08-13: qty 3

## 2011-08-13 MED ORDER — SODIUM CHLORIDE 0.9 % IV SOLN
250.0000 mL | INTRAVENOUS | Status: DC | PRN
  Administered 2011-08-13: 250 mL via INTRAVENOUS

## 2011-08-13 MED ORDER — ASPIRIN 81 MG PO CHEW
81.0000 mg | CHEWABLE_TABLET | Freq: Every day | ORAL | Status: DC
  Administered 2011-08-14 – 2011-08-15 (×2): 81 mg via ORAL
  Filled 2011-08-13 (×2): qty 1

## 2011-08-13 MED ORDER — DILTIAZEM HCL 25 MG/5ML IV SOLN
INTRAVENOUS | Status: AC
  Administered 2011-08-13: 10 mg
  Filled 2011-08-13: qty 5

## 2011-08-13 MED ORDER — LINAGLIPTIN 5 MG PO TABS
5.0000 mg | ORAL_TABLET | Freq: Every day | ORAL | Status: DC
  Administered 2011-08-14 – 2011-08-15 (×2): 5 mg via ORAL
  Filled 2011-08-13 (×2): qty 1

## 2011-08-13 MED ORDER — ACETAMINOPHEN 325 MG PO TABS: 650.0000 mg | ORAL_TABLET | Freq: Four times a day (QID) | ORAL | Status: DC | PRN

## 2011-08-13 MED ORDER — MIDODRINE HCL 5 MG PO TABS
5.0000 mg | ORAL_TABLET | Freq: Two times a day (BID) | ORAL | Status: DC
Start: 2011-08-14 — End: 2011-08-15
  Administered 2011-08-14 – 2011-08-15 (×3): 5 mg via ORAL
  Filled 2011-08-13 (×6): qty 1

## 2011-08-13 MED ORDER — FUROSEMIDE 40 MG PO TABS
40.0000 mg | ORAL_TABLET | Freq: Every day | ORAL | Status: DC
  Administered 2011-08-14 – 2011-08-15 (×2): 40 mg via ORAL
  Filled 2011-08-13 (×2): qty 1

## 2011-08-13 MED ORDER — ONDANSETRON HCL 4 MG PO TABS: 4.0000 mg | ORAL_TABLET | Freq: Four times a day (QID) | ORAL | Status: DC | PRN

## 2011-08-13 MED ORDER — TRAVOPROST 0.004 % OP SOLN
1.0000 [drp] | Freq: Every day | OPHTHALMIC | Status: DC
Start: 2011-08-13 — End: 2011-08-13

## 2011-08-13 MED ORDER — HEPARIN BOLUS VIA INFUSION
4000.0000 [IU] | Freq: Once | INTRAVENOUS | Status: AC
Start: 2011-08-13 — End: 2011-08-13
  Administered 2011-08-13: 4000 [IU] via INTRAVENOUS
  Filled 2011-08-13: qty 4000

## 2011-08-13 MED ORDER — DILTIAZEM HCL 25 MG/5ML IV SOLN
15.0000 mg | Freq: Once | INTRAVENOUS | Status: AC
  Administered 2011-08-13: 15 mg via INTRAVENOUS

## 2011-08-13 MED ORDER — LEVOTHYROXINE SODIUM 75 MCG PO TABS
75.0000 ug | ORAL_TABLET | Freq: Every day | ORAL | Status: DC
  Administered 2011-08-14 – 2011-08-15 (×2): 75 ug via ORAL
  Filled 2011-08-13 (×4): qty 1

## 2011-08-13 MED ORDER — VITAMIN C 500 MG PO TABS
1000.0000 mg | ORAL_TABLET | Freq: Every day | ORAL | Status: DC
  Administered 2011-08-14 – 2011-08-15 (×2): 1000 mg via ORAL
  Filled 2011-08-13 (×2): qty 2

## 2011-08-13 MED ORDER — GUAIFENESIN-DM 100-10 MG/5ML PO SYRP: 15.0000 mL | ORAL_SOLUTION | ORAL | Status: DC | PRN

## 2011-08-13 NOTE — ED Notes (Signed)
Report given to [facility], pt and family updated

## 2011-08-13 NOTE — ED Provider Notes (Signed)
History     CSN: [id number] Arrival date & time: 08/13/2011  1:25 PM   Chief Complaint  Patient presents with  . Chest Pain   Patient is a 77 y.o. female presenting with chest pain. The history is provided by the patient and a relative. History Limited By: Urgent need for intervention.  Chest Pain   Pt was seen at 1345.  Pt seen on arrival to ED exam room.  Per pt and family, c/o gradual onset and worsening of palpitations and SOB for the past 4 days.  Symptoms were occurring intermittently, but have become constant since this morning.  Denies fevers, no CP, no back pain, no abd pain.   Past Medical History  Diagnosis Date  . Hypertension   . Dyslipidemia   . CAD (coronary artery disease)     Non-STEMI August, 2010,,,, 40% left main, 30% LAD, 60% OM 2, 40% RCA, 90% OM1-culprit lesion-too small for intervention           . Ejection fraction     EF 55% ,2011 / EF 30% with tachycardia cardiomyopathy / EF 50% January, 2012  . Pacemaker     St. Jude, September, 2010  . Diabetes mellitus   . Dementia     ?? Early dementia ?? 2012  . GERD (gastroesophageal reflux disease)   . TIA (transient ischemic attack)     History of TIAs  . Hypothyroidism   . Carotid artery disease     Carotid endarterectomy, right  . PAD (peripheral artery disease)     Stents right peroneal, right superficial, right popliteal arteries, Dr. Edilia Bo  . Amputation     Right midfoot 2010  . CKD (chronic kidney disease)     Creatinine 1.4 hospital discharge January 2 012  . Gait disorder   . Vertebrobasilar insufficiency   . Depression   . Hyperkalemia     ACE Inhibitor held  . Acute renal insufficiency     Catheterization August 2 010  . Blindness of right eye   . Respiratory failure     Hypoxic and hypercapnic made, 2011, etiology pneumonia  . Leukocytosis 5/11    Persistent post pneumonia  . Orthostatic hypotension     Syncope January 2012 with facial trauma, amiodarone stopped  . Tachycardia  induced cardiomyopathy   . Atrial fibrillation 8/10    Coumadin use in the past.  Patient decided August, 2012,That she did not want Coumadin restart  /    Rapid rate; Coumadin started TE vardioversion done 04/30/09; amiodarone, beta blockers during hospitalization 8/10; beta blockers lower blood pressure excessively without heart rate control  . Ventricular tachycardia     20 beats December, 2012, asymptomatic  . Atrial flutter 05/24/09    Hospitalization in [place]; with difficult rate control; TEE cardioversion done EF 30%, secondary to tachycardia, pt did convert back tosinus rhythm; return 10/11, amiodarone restarted 10/11  . Bradycardia, severe sinus 05/31/09    [facility]; cardioversion, chest compressions, pacemake placed; amiodarone used until stopped 5/11  . Anemia in CKD (chronic kidney disease)   . Gastroparesis 3/11    Abnormal GES    Past Surgical History  Procedure Date  . Total abdominal hysterectomy   . Cholecystectomy   . Carotid endarterectomy   . Amputation 2010    RIGHT MIDFOOT  . Tonsillectomy   . Eye surgeries     Several right  . Pacemaker insertion     SJM Pacemaker implant 9/10  . Pci stent to  the right peroneal and right superficial as well as right popliteal arteries     Dr. Edilia Bo    Family History  Problem Relation Age of Onset  . Cancer Brother     colon cancer  . Aortic aneurysm Mother   . Colon polyps Neg Hx   . Liver disease Neg Hx     And no CRC    History  Substance Use Topics  . Smoking status: Never Smoker   . Smokeless tobacco: Not on file  . Alcohol Use: No    Review of Systems  Unable to perform ROS: Other  Cardiovascular: Positive for chest pain.    Allergies  Morphine sulfate and Oxycodone hcl  Home Medications   Current Outpatient Rx  Name Route Sig Dispense Refill  . ACETAMINOPHEN 500 MG PO TABS Oral Take 500 mg by mouth every 6 (six) hours as needed.      [name] VITAMIN C 1000 MG PO TABS Oral Take 1,000 mg  by mouth daily.      . ASPIRIN 81 MG PO CHEW Oral Chew 162 mg by mouth daily.      . FUROSEMIDE 40 MG PO TABS Oral Take 1 tablet (40 mg total) by mouth daily. 30 tablet 6  . INSULIN ASPART 100 UNIT/ML [place] SOLN Subcutaneous Inject into the skin as directed. Per sliding scale     . INSULIN GLARGINE 100 UNIT/ML [place] SOLN Subcutaneous Inject 15 Units into the skin at bedtime.      [name] LEVOTHYROXINE SODIUM 75 MCG PO TABS Oral Take 75 mcg by mouth daily.      [name] LINAGLIPTIN 5 MG PO TABS Oral Take 5 mg by mouth daily.      [name] MIDODRINE HCL 5 MG PO TABS  take 1 tablet by mouth twice a day 60 tablet 6  . VITAMIN B-6 100 MG PO TABS Oral Take 100 mg by mouth daily.      . SERTRALINE HCL 100 MG PO TABS Oral Take 100 mg by mouth daily.      . TRAVOPROST 0.004 % OP SOLN Both Eyes Place 1 drop into both eyes at bedtime.      [name] NITROGLYCERIN 0.4 MG SL SUBL Sublingual Place 0.4 mg under the tongue every 5 (five) minutes as needed.        BP 115/63  Pulse 131  Temp(Src) 98.5 F (36.9 C) (Oral)  Resp 20  SpO2 98%  Physical Exam 1350: Physical examination:  Nursing notes reviewed; Vital signs and O2 SAT reviewed;  Constitutional: Well developed, Well nourished, Well hydrated, In no acute distress; Head:  Normocephalic, atraumatic; Eyes: EOMI, PERRL, No scleral icterus; ENMT: Mouth and pharynx normal, Mucous membranes moist; Neck: Supple, Full range of motion, No lymphadenopathy; Cardiovascular: Irregular irregular rhythm, tachycardic, No murmur or gallop; Respiratory: Breath sounds coarse & equal bilaterally, No wheezes, Normal respiratory effort/excursion; Chest: Nontender, Movement normal; Abdomen: Soft, Nontender, Nondistended, Normal bowel sounds; Extremities: Pulses normal, No tenderness, No edema, No calf edema or asymmetry.; Neuro: AA&Ox3, poor historian, Major CN grossly intact.  No gross focal motor or sensory deficits in extremities.; Skin: Color normal, Warm, Dry, no rash.    ED Course  Procedures     MDM  MDM Reviewed: nursing note, vitals and previous chart Reviewed previous: ECG and labs Interpretation: ECG, labs and x-ray Total time providing critical care: 30-74 minutes. This excludes time spent performing separately reportable procedures and services. Consults: cardiology   CRITICAL CARE Performed by: Laray Anger Total  critical care time: 45 Critical care time was exclusive of separately billable procedures and treating other patients. Critical care was necessary to treat or prevent imminent or life-threatening deterioration. Critical care was time spent personally by me on the following activities: development of treatment plan with patient and/or surrogate as well as nursing, discussions with consultants, evaluation of patient's response to treatment, examination of patient, obtaining history from patient or surrogate, ordering and performing treatments and interventions, ordering and review of laboratory studies, ordering and review of radiographic studies, pulse oximetry and re-evaluation of patient's condition.    Date: 08/13/2011  Rate: 139  Rhythm: atrial fibrillation  QRS Axis: normal  Intervals: normal  ST/T Wave abnormalities: nonspecific ST/T changes  Conduction Disutrbances:none  Narrative Interpretation:   Old EKG Reviewed: changes noted; previous EKG dated 10/20/2010 with paced rhythm.  Results for orders placed during the hospital encounter of 08/13/11  CBC      Component Value Range   WBC 10.4  4.0 - 10.5 (K/uL)   RBC 4.17  3.87 - 5.11 (MIL/uL)   Hemoglobin 12.4  12.0 - 15.0 (g/dL)   HCT 04.5  40.9 - 81.1 (%)   MCV 89.2  78.0 - 100.0 (fL)   MCH 29.7  26.0 - 34.0 (pg)   MCHC 33.3  30.0 - 36.0 (g/dL)   RDW 91.4  78.2 - 95.6 (%)   Platelets 287  150 - 400 (K/uL)  COMPREHENSIVE METABOLIC PANEL      Component Value Range   Sodium 138  135 - 145 (mEq/L)   Potassium 3.8  3.5 - 5.1 (mEq/L)   Chloride 99  96 - 112 (mEq/L)   CO2 29  19 - 32  (mEq/L)   Glucose, Bld 203 (*) 70 - 99 (mg/dL)   BUN 25 (*) 6 - 23 (mg/dL)   Creatinine, Ser 2.13  0.50 - 1.10 (mg/dL)   Calcium 08.6  8.4 - 10.5 (mg/dL)   Total Protein 7.3  6.0 - 8.3 (g/dL)   Albumin 2.9 (*) 3.5 - 5.2 (g/dL)   AST 18  0 - 37 (U/L)   ALT 12  0 - 35 (U/L)   Alkaline Phosphatase 104  39 - 117 (U/L)   Total Bilirubin 0.5  0.3 - 1.2 (mg/dL)   GFR calc non Af Amer 51 (*) >90 (mL/min)   GFR calc Af Amer 59 (*) >90 (mL/min)  PRO B NATRIURETIC PEPTIDE      Component Value Range   BNP, POC 2562.0 (*) 0 - 450 (pg/mL)  POCT I-STAT TROPONIN I      Component Value Range   Troponin i, poc 0.01  0.00 - 0.08 (ng/mL)   Comment 3            Results for MYIESHA, EDGAR (MRN [id number]) as of 08/13/2011 15:13  Ref. Range 09/02/2010 04:12 09/03/2010 05:22 09/04/2010 05:43 09/13/2010 06:20 09/20/2010 05:40 10/14/2010 13:00 10/15/2010 05:04 08/13/2011 13:49  BNP, POC Latest Range: 0-450 pg/mL 958.0 (H) 504.0 (H) 152.0 (H) 65.8 158.0 (H) 1120.0 (H) 912.0 (H) 2562.0 (H)     Dg Chest Portable 1 View  08/13/2011  *RADIOLOGY REPORT*  Clinical Data: Chest pain.  Nonsmoker. Shortness of breath.  PORTABLE CHEST - 1 VIEW  Comparison: 10/20/2010.  Findings: Sequential pacemaker enters from the left with leads unchanged in position.  Mild cardiomegaly.  Central pulmonary vascular prominence.  No infiltrate, congestive heart failure or pneumothorax.  IMPRESSION: Chest central pulmonary vascular prominence.  Pacemaker in place with cardiomegaly.  Calcified  aorta.  Original Report Authenticated By: Fuller Canada, M.D.    3:16 PM:  HR drops to 110's but increases to 130's after IV cardizem bolus and gtt at 10mg /hr.  Will rebolus.  BNP elevated from baseline.  Will dose lasix IV.  Dx testing d/w pt and family.  Questions answered.  Verb understanding, agreeable to be admitted.  Will call [facility] to transfer/admit due to pt with complicated cardiac hx (hx afib tx with TEE cardioversion, afib unresponsive to  b-blockers, poor amiodarone tolerance, does not want to be on coumadin, hx pacemaker).   1600:  3rd cardizem bolus given, gtt at 15mg /hr.  Monitor continues afib, HR now ranging down to 80/90's to 110/120's.  SBP 110's.  Mentating well, resps easy.  1630:  T/C to Cards Dr. Eden Emms, case discussed, including:  HPI, pertinent PM/SHx, VS/PE, dx testing, ED course and treatment.  Agreeable to treatment plan at this time, keep cardizem as it is, as pt is currently comfortable.  He accepts transfer to [facility] for admit.  Requests ICU bed.  Pt and family informed, aware, agreeable with plan.  Pt requesting foley cath due to increasing SOB and HR when gets on bedpan to void.  Will place.   1800:  [facility] here to transport pt to [facility]  Monitor appears very regular tachycardia at this time, rate 120's, SBP 100-110's, Sats 98% on O2 2L N/C.  Mentating per her baseline, resps easy.  States she continues to feel "better now."  Will transport stable.      Brittney Menor Allison Quarry, DO 08/14/11 1812

## 2011-08-13 NOTE — ED Notes (Signed)
[facility] here to pick pt up 

## 2011-08-13 NOTE — H&P (Addendum)
Clinical Summary Ms. Lumadue is a medically complex 77 y.o.female followed by Dr. Myrtis Ser. Her history is detailed below. She was last seen in the office back in August, doing well symptomatically at that time. She has had recurrent atrial arrhythmias over the years, including atrial fibrillation and atrial flutter, requiring electrical cardioversion, and previous treatment with antiarrhythmic therapy. She was taken off of amiodarone in the past related to intolerances, and decided that she did not want to stay on Coumadin long term.  She is now transferred from [facility], describing symptoms of headache, sore throat, fullness in the ears, and rapid palpitations which began earlier this morning. She has not felt well in general over the last several days, reports a dry cough over a period of the last few weeks. She was felt to be in atrial fibrillation with rapid ventricular response during assessment in ER, has been placed on intravenous Cardizem. Review of available ECGs shows atrial flutter with at times variable conduction, presently with regular conduction in the 120s to 130s. She reports feeling more comfortable at this time, no specific anginal chest pain or breathlessness at rest.  She reports compliance with her medications, although has had some nausea and emesis recently. Lab work shows normal liver function tests. Chest x-ray shows no acute infiltrates, vascular prominence, although no edema.   Allergies  Allergen Reactions  . Morphine Sulfate     REACTION: jerking  . Oxycodone Hcl Other (See Comments)    Feels wierd    Home Medications Prescriptions prior to admission  Medication Sig Dispense Refill  . acetaminophen (TYLENOL) 500 MG tablet Take 500 mg by mouth every 6 (six) hours as needed.        . Ascorbic Acid (VITAMIN C) 1000 MG tablet Take 1,000 mg by mouth daily.        [name] aspirin 81 MG chewable tablet Chew 162 mg by mouth daily.        . furosemide (LASIX) 40 MG tablet  Take 1 tablet (40 mg total) by mouth daily.  30 tablet  6  . insulin aspart (NOVOLOG) 100 UNIT/ML injection Inject into the skin as directed. Per sliding scale       . insulin glargine (LANTUS) 100 UNIT/ML injection Inject 15 Units into the skin at bedtime.        [name] levothyroxine (SYNTHROID, LEVOTHROID) 75 MCG tablet Take 75 mcg by mouth daily.        [name] linagliptin (TRADJENTA) 5 MG TABS tablet Take 5 mg by mouth daily.        . midodrine (PROAMATINE) 5 MG tablet take 1 tablet by mouth twice a day  60 tablet  6  . pyridOXINE (VITAMIN B-6) 100 MG tablet Take 100 mg by mouth daily.        . sertraline (ZOLOFT) 100 MG tablet Take 100 mg by mouth daily.        . travoprost, benzalkonium, (TRAVATAN) 0.004 % ophthalmic solution Place 1 drop into both eyes at bedtime.        . nitroGLYCERIN (NITROSTAT) 0.4 MG SL tablet Place 0.4 mg under the tongue every 5 (five) minutes as needed.          Past Medical History  Diagnosis Date  . Hypertension   . Dyslipidemia   . CAD (coronary artery disease)     Non-STEMI August, 2010,,,, 40% left main, 30% LAD, 60% OM 2, 40% RCA, 90% OM1-culprit lesion-too small for intervention           .  Ejection fraction     EF 55% ,2011 / EF 30% with tachycardia cardiomyopathy / EF 50% January, 2012  . Pacemaker     St. Jude, September, 2010  . Diabetes mellitus   . Dementia     ?? Early dementia ?? 2012  . GERD (gastroesophageal reflux disease)   . TIA (transient ischemic attack)     History of TIAs  . Hypothyroidism   . Carotid artery disease     Carotid endarterectomy, right  . PAD (peripheral artery disease)     Stents right peroneal, right superficial, right popliteal arteries, Dr. Edilia Bo  . Amputation     Right midfoot 2010  . CKD (chronic kidney disease)     Creatinine 1.4 hospital discharge January 2012  . Gait disorder   . Vertebrobasilar insufficiency   . Depression   . Hyperkalemia     ACE Inhibitor held  . Acute renal insufficiency      Catheterization August 2 010  . Blindness of right eye   . Respiratory failure     Hypoxic and hypercapnic, 2011, etiology pneumonia  . Leukocytosis 5/11    Persistent post pneumonia  . Orthostatic hypotension     Syncope January 2012 with facial trauma, amiodarone stopped  . Tachycardia induced cardiomyopathy   . Atrial fibrillation 8/10    Coumadin use in the past.  Patient decided August, 2012,That she did not want Coumadin restart  /    Rapid rate; Coumadin started TE vardioversion done 04/30/09; amiodarone, beta blockers during hospitalization 8/10; beta blockers lower blood pressure excessively without heart rate control  . Ventricular tachycardia     20 beats December, 2012, asymptomatic  . Atrial flutter 05/24/09    Hospitalization in [place]; with difficult rate control; TEE cardioversion done EF 30%, secondary to tachycardia, pt did convert back tosinus rhythm; return 10/11, amiodarone restarted 10/11  . Bradycardia, severe sinus 05/31/09    [facility]; cardioversion, chest compressions, pacemake placed; amiodarone used until stopped 5/11  . Anemia in CKD (chronic kidney disease)   . Gastroparesis 3/11    Abnormal GES    Past Surgical History  Procedure Date  . Total abdominal hysterectomy   . Cholecystectomy   . Carotid endarterectomy   . Amputation 2010    RIGHT MIDFOOT  . Tonsillectomy   . Eye surgeries     Several right  . Pacemaker insertion     SJM Pacemaker implant 9/10  . Pci stent to the right peroneal and right superficial as well as right popliteal arteries     Dr. Edilia Bo    Family History  Problem Relation Age of Onset  . Cancer Brother     Colon cancer  . Aortic aneurysm Mother   . Colon polyps Neg Hx   . Liver disease Neg Hx     And no CRC    Social History Ms. Micallef reports that she has never smoked. She has never used smokeless tobacco. Ms. Hoelzel reports that she does not drink alcohol.  Review of Systems Reports an episode of  syncope on Thanksgiving, details not clear. She has known history of orthostasis and prior syncope, on medical therapy for this. No exertional chest pain. No orthopnea. No significant lower extremity edema. No fevers or chills. Otherwise negative.  Physical Examination Temp:  [98.5 F (36.9 C)] 98.5 F (36.9 C) (12/05 1330) Pulse Rate:  [75-133] 133  (12/05 1700) Resp:  [13-24] 24  (12/05 1806) BP: (103-123)/(55-76) 109/60 mmHg (12/05 1806)  SpO2:  [95 %-99 %] 98 % (12/05 1806)  Normally nourished appearing woman in no acute distress. HEENT: Conjunctiva and lids normal, oropharynx with moist mucosa. Neck: Supple, no elevated JVP or carotid bruits, no thyromegaly. Lungs: Clear to auscultation, somewhat diminished breath sounds, nonlabored. Cardiac: Regular rate and rhythm, rapid, no obvious S3, soft systolic murmur, PMI is indistinct. Abdomen: Soft, nontender, bowel sounds present. Skin: Warm and dry. Musculoskeletal: No kyphosis. Extremities: No pitting edema, distal pulses 1+. Neuropsychiatric: Alert and oriented x3, somewhat hard of hearing, affect appropriate  Testing Lab Results  Component Value Date   WBC 10.4 08/13/2011   HGB 12.4 08/13/2011   HCT 37.2 08/13/2011   MCV 89.2 08/13/2011   PLT 287 08/13/2011    Lab Results  Component Value Date   CREATININE 1.03 08/13/2011   BUN 25* 08/13/2011   NA 138 08/13/2011   K 3.8 08/13/2011   CL 99 08/13/2011   CO2 29 08/13/2011    Lab Results  Component Value Date   ALT 12 08/13/2011   AST 18 08/13/2011   ALKPHOS 104 08/13/2011   BILITOT 0.5 08/13/2011    ECG Probably atypical atrial flutter with variable conduction, nonspecific ST-T changes.   Impression  1. Recurrent atrial flutter with rapid ventricular response, onset presumably this morning with symptoms of rapid palpitations. She has previous history of recurrent atrial arrhythmias including both atrial flutter and atrial fibrillation requiring prior electrical cardioversion,  previously treated with amiodarone although this was discontinued related to intolerance, and not on Coumadin over the last several months. She does not report intolerance or problems with Coumadin, however decided she did not want to take it long term. Heart rate has come down with intravenous Cardizem, and although not optimally controlled, she feels better symptomatically.  2. History of tachycardia mediated cardiomyopathy, with prior documentation of improvement in LVEF in the setting of rhythm control. Echocardiogram from January of this year revealed LVEF of 50% with inferior septal hypokinesis and moderate to severe left atrial enlargement.  3. Other constitutional symptoms including sore throat, headache, fullness in the ears, recent nausea, and dry cough. Etiology not yet certain. Could have viral illness or other noncardiac process.  4. History of orthostatic hypotension and syncope. She is on Midrin as an outpatient. She reports an episode of syncope on Thanksgiving.  5. History of symptomatic bradycardia status post St. Jude pacemaker placement in 2010.   Recommendations  Patient admitted to the 2900 unit. Situation was discussed with patient and family members. Plan at this point is to initiate heparin infusion, continue Cardizem infusion, and more than likely plan on TEE guided cardioversion ultimately to restore sinus rhythm, presuming she does not convert spontaneously. Will need to avoid amiodarone given prior intolerances, and I did have a brief discussion with her about at least reconsidering Coumadin in the short-term, as this will be necessary for [place] to pursue a cardioversion. Possible consideration could be given to a formal EP consultation to consider other treatment options for her atrial arrhythmias. Cardiac markers will be cycled, although at this point doubt acute coronary syndrome. Once heart rate control is better, or she is converted, a followup echocardiogram can be  obtained to reassess LVEF. Will send an influenza screen in addition to above workup. Repeat TSH as well. These plans are preliminary, and can certainly be modified and further guided by Dr. Myrtis Ser who knows the patient quite well.   Jonelle Sidle, M.D., F.A.C.C.

## 2011-08-13 NOTE — ED Notes (Signed)
Blood pressure prior to cardizem bolus is 103/63, hr 140's,

## 2011-08-13 NOTE — ED Notes (Signed)
Bed assignment given at [facility] room 2905, pt and family updated

## 2011-08-13 NOTE — ED Notes (Addendum)
Pt c/o chest pain that has come and went for several days, states that she feels her heart beating fast, sob at times, pt arrived to er in rapid a-fib, rate 160's, Dr. Clarene Duke notified of pt arrival in department, additional orders given, pt placed on oxygen at 2lmp via [place] upon arrival to er

## 2011-08-13 NOTE — Progress Notes (Signed)
ANTICOAGULATION CONSULT NOTE - Initial Consult  Pharmacy Consult for heparin Indication: atrial fibrillation  Allergies  Allergen Reactions  . Morphine Sulfate     REACTION: jerking  . Oxycodone Hcl Other (See Comments)    Feels wierd    Patient Measurements:   Adjusted Body Weight:   Vital Signs: Temp: 98.5 F (36.9 C) (12/05 1330) Temp src: Oral (12/05 1330) BP: 109/60 mmHg (12/05 1806) Pulse Rate: 133  (12/05 1700)  Labs:  Basename 08/13/11 1349  HGB 12.4  HCT 37.2  PLT 287  APTT --  LABPROT --  INR --  HEPARINUNFRC --  CREATININE 1.03  CKTOTAL --  CKMB --  TROPONINI --   The CrCl is unknown because both a height and weight (above a minimum accepted value) are required for this calculation.  Medical History: Past Medical History  Diagnosis Date  . Hypertension   . Dyslipidemia   . CAD (coronary artery disease)     Non-STEMI August, 2010,,,, 40% left main, 30% LAD, 60% OM 2, 40% RCA, 90% OM1-culprit lesion-too small for intervention           . Ejection fraction     EF 55% ,2011 / EF 30% with tachycardia cardiomyopathy / EF 50% January, 2012  . Pacemaker     St. Jude, September, 2010  . Diabetes mellitus   . Dementia     ?? Early dementia ?? 2012  . GERD (gastroesophageal reflux disease)   . TIA (transient ischemic attack)     History of TIAs  . Hypothyroidism   . Carotid artery disease     Carotid endarterectomy, right  . PAD (peripheral artery disease)     Stents right peroneal, right superficial, right popliteal arteries, Dr. Edilia Bo  . Amputation     Right midfoot 2010  . CKD (chronic kidney disease)     Creatinine 1.4 hospital discharge January 2012  . Gait disorder   . Vertebrobasilar insufficiency   . Depression   . Hyperkalemia     ACE Inhibitor held  . Acute renal insufficiency     Catheterization August 2 010  . Blindness of right eye   . Respiratory failure     Hypoxic and hypercapnic, 2011, etiology pneumonia  . Leukocytosis  5/11    Persistent post pneumonia  . Orthostatic hypotension     Syncope January 2012 with facial trauma, amiodarone stopped  . Tachycardia induced cardiomyopathy   . Atrial fibrillation 8/10    Coumadin use in the past.  Patient decided August, 2012,That she did not want Coumadin restart  /    Rapid rate; Coumadin started TE vardioversion done 04/30/09; amiodarone, beta blockers during hospitalization 8/10; beta blockers lower blood pressure excessively without heart rate control  . Ventricular tachycardia     20 beats December, 2012, asymptomatic  . Atrial flutter 05/24/09    Hospitalization in [place]; with difficult rate control; TEE cardioversion done EF 30%, secondary to tachycardia, pt did convert back tosinus rhythm; return 10/11, amiodarone restarted 10/11  . Bradycardia, severe sinus 05/31/09    [facility]; cardioversion, chest compressions, pacemake placed; amiodarone used until stopped 5/11  . Anemia in CKD (chronic kidney disease)   . Gastroparesis 3/11    Abnormal GES    Medications:  Scheduled:    . aspirin  81 mg Oral Daily  . diltiazem (CARDIZEM) infusion  5-15 mg/hr Intravenous Once  . diltiazem      . diltiazem  15 mg Intravenous Once  .  diltiazem  20 mg Intravenous Once  . furosemide  40 mg Intravenous Once  . furosemide  40 mg Oral Daily  . insulin aspart  0-9 Units Subcutaneous TID WC  . insulin glargine  10 Units Subcutaneous QHS  . levothyroxine  75 mcg Oral QAC breakfast  . linagliptin  5 mg Oral Daily  . midodrine  5 mg Oral BID WC  . pyridOXINE  100 mg Oral Daily  . sertraline  100 mg Oral Daily  . sodium chloride  3 mL Intravenous Q12H  . Travoprost (BAK Free)  1 drop Both Eyes QHS  . vitamin C  1,000 mg Oral Daily  . DISCONTD: travoprost (benzalkonium)  1 drop Both Eyes QHS  . DISCONTD: vitamin C  1,000 mg Oral Daily   Infusions:    . diltiazem (CARDIZEM) infusion    . DISCONTD: sodium chloride 100 mL/hr at 08/13/11 1407  . DISCONTD:  sodium chloride 75 mL/hr at 08/13/11 1407    Assessment: 77 yo with MMP who was admitted for afib. She has not been on coumadin as outpatient. IV heparin will be started here for anticoagulation. Plan for TEE w/DCCV while inpatient.   Goal of Therapy:  Heparin level 0.3-0.7 units/ml   Plan:  1. Heparin bolus 4000 units x1 2. Heparin drip at 1000 units/hr 3. F/u with AM heparin level  Uly[employer] [place] [employer] 08/13/2011,8:34 PM

## 2011-08-13 NOTE — ED Notes (Signed)
Pt reports sscp onset x 4 days ago which has been associated with ShOB and vomiting.

## 2011-08-13 NOTE — ED Notes (Signed)
Report given to Huntley Dec, RN on 2900

## 2011-08-13 NOTE — ED Notes (Signed)
Dr. Clarene Duke advised of increase in cardizem drip, pt and family updated

## 2011-08-14 DIAGNOSIS — I4892 Unspecified atrial flutter: Secondary | ICD-10-CM

## 2011-08-14 DIAGNOSIS — I059 Rheumatic mitral valve disease, unspecified: Secondary | ICD-10-CM

## 2011-08-14 LAB — GLUCOSE, CAPILLARY
Glucose-Capillary: 327 mg/dL — ABNORMAL HIGH (ref 70–99)
Glucose-Capillary: 113 mg/dL — ABNORMAL HIGH (ref 70–99)

## 2011-08-14 LAB — CBC
Hemoglobin: 12.8 g/dL (ref 12.0–15.0)
MCH: 29.6 pg (ref 26.0–34.0)
Platelets: 293 10*3/uL (ref 150–400)
RBC: 4.32 MIL/uL (ref 3.87–5.11)
WBC: 12.4 10*3/uL — ABNORMAL HIGH (ref 4.0–10.5)

## 2011-08-14 LAB — INFLUENZA PANEL BY PCR (TYPE A & B): Influenza A By PCR: NEGATIVE

## 2011-08-14 LAB — BASIC METABOLIC PANEL
Calcium: 9.4 mg/dL (ref 8.4–10.5)
GFR calc Af Amer: 49 mL/min — ABNORMAL LOW (ref >90)
GFR calc non Af Amer: 42 mL/min — ABNORMAL LOW (ref >90)
Potassium: 3.5 mEq/L (ref 3.5–5.1)
Sodium: 137 mEq/L (ref 135–145)

## 2011-08-14 LAB — HEPARIN LEVEL (UNFRACTIONATED): Heparin Unfractionated: 0.3 IU/mL (ref 0.30–0.70)

## 2011-08-14 LAB — CARDIAC PANEL(CRET KIN+CKTOT+MB+TROPI)
Relative Index: INVALID (ref 0.0–2.5)
Troponin I: <0.3 ng/mL (ref <0.30)

## 2011-08-14 LAB — TSH: TSH: 2.471 u[IU]/mL (ref 0.350–4.500)

## 2011-08-14 MED ORDER — CHLORHEXIDINE GLUCONATE CLOTH 2 % EX PADS
6.0000 | MEDICATED_PAD | Freq: Every day | CUTANEOUS | Status: DC
  Administered 2011-08-14: 6 via TOPICAL

## 2011-08-14 MED ORDER — DILTIAZEM HCL ER COATED BEADS 120 MG PO CP24
120.0000 mg | ORAL_CAPSULE | Freq: Every day | ORAL | Status: DC
  Administered 2011-08-14 – 2011-08-15 (×2): 120 mg via ORAL
  Filled 2011-08-14 (×2): qty 1

## 2011-08-14 MED ORDER — MUPIROCIN 2 % EX OINT
1.0000 "application " | TOPICAL_OINTMENT | Freq: Two times a day (BID) | CUTANEOUS | Status: DC
  Administered 2011-08-14 – 2011-08-15 (×3): 1 via NASAL
  Filled 2011-08-14 (×2): qty 22

## 2011-08-14 MED ORDER — INSULIN ASPART 100 UNIT/ML ~~LOC~~ SOLN
0.0000 [IU] | Freq: Three times a day (TID) | SUBCUTANEOUS | Status: DC
  Administered 2011-08-14: 5 [IU] via SUBCUTANEOUS
  Administered 2011-08-14: 7 [IU] via SUBCUTANEOUS
  Filled 2011-08-14: qty 3

## 2011-08-14 NOTE — Progress Notes (Signed)
@   Subjective:  Denies CP or dyspnea   Objective:  Filed Vitals:   08/14/11 0500 08/14/11 0600 08/14/11 0700 08/14/11 0751  BP: 122/52 114/46 132/53   Pulse: 64 62 61   Temp:    98.7 F (37.1 C)  TempSrc:    Oral  Resp: 14 14 17    Height:      Weight:      SpO2: 96% 95% 98%     Intake/Output from previous day:  Intake/Output Summary (Last 24 hours) at 08/14/11 0810 Last data filed at 08/14/11 0400  Gross per 24 hour  Intake 486.42 ml  Output   2300 ml  Net -1813.58 ml    Physical Exam: Physical exam: Well-developed well-nourished in no acute distress.  Skin is warm and dry.  HEENT is normal.  Neck is supple. No thyromegaly.  Chest is clear to auscultation with normal expansion.  Cardiovascular exam is regular rate and rhythm.  Abdominal exam nontender or distended. No masses palpated. Extremities show no edema. neuro grossly intact    Lab Results: Basic Metabolic Panel:  Basename 08/14/11 0538 08/13/11 1349  NA 137 138  K 3.5 3.8  CL 97 99  CO2 32 29  GLUCOSE 296* 203*  BUN 30* 25*  CREATININE 1.21* 1.03  CALCIUM 9.4 10.3  MG -- --  PHOS -- --   Liver Function Tests:  [facility] 08/13/11 1349  AST 18  ALT 12  ALKPHOS 104  BILITOT 0.5  PROT 7.3  ALBUMIN 2.9*   CBC:  Basename 08/14/11 0538 08/13/11 1349  WBC 12.4* 10.4  NEUTROABS -- --  HGB 12.8 12.4  HCT 38.7 37.2  MCV 89.6 89.2  PLT 293 287     Basename 08/13/11 1349  POCBNP 2562.0*   Thyroid Function Tests:  Basename 08/13/11 2113  TSH 2.471  T4TOTAL --  T3FREE --  THYROIDAB --      Telemetry: atrial paced    Assessment/Plan:  1) atrial flutter with history of atrial fibrillation - patient declines coumadin and understands risk of embolic CVA. Continue ASA. Change cardizem to po. Watch BP given h/o orthostatsis. Await echo. ? Consideration of afib and atrial flutter ablation in the future. 2) possible viral illness - improving 3) chest pain - need fu enzymes; doubt  ischemia  Olga Millers 08/14/2011, 8:10 AM

## 2011-08-14 NOTE — Progress Notes (Signed)
Patient arrived at 61 but was not in system at the time.. Error 2100.

## 2011-08-14 NOTE — Progress Notes (Signed)
Inpatient Diabetes Program Recommendations  AACE/ADA: New Consensus Statement on Inpatient Glycemic Control (2009)  Target Ranges:  Prepandial:   less than 140 mg/dL      Peak postprandial:   less than 180 mg/dL (1-2 hours)      Critically ill patients:  140 - 180 mg/dL   Reason: Hyperglycemia  Inpatient Diabetes Program Recommendations Insulin - Basal: Increase lantus to home dose 15 units HgbA1C: order A1C to assess pre-hospital glucose control  Piedad Climes RN, Diabetes Coordinator [phone]

## 2011-08-14 NOTE — Progress Notes (Signed)
ANTICOAGULATION CONSULT NOTE - Initial Consult  Pharmacy Consult for heparin Indication: atrial fibrillation  Allergies  Allergen Reactions  . Morphine Sulfate     REACTION: jerking  . Oxycodone Hcl Other (See Comments)    Feels wierd    Patient Measurements: Height: 5\' 7"  (170.2 cm) Weight: 157 lb 10.1 oz (71.5 kg) IBW/kg (Calculated) : 61.6   Vital Signs: Temp: 98.5 F (36.9 C) (12/06 0400) Temp src: Oral (12/06 0400) BP: 114/46 mmHg (12/06 0600) Pulse Rate: 62  (12/06 0600)  Labs:  Basename 08/14/11 0538 08/13/11 2113 08/13/11 1349  HGB 12.8 -- 12.4  HCT 38.7 -- 37.2  PLT 293 -- 287  APTT -- 36 --  LABPROT -- 14.0 --  INR -- 1.06 --  HEPARINUNFRC 0.30 -- --  CREATININE -- -- 1.03  CKTOTAL -- -- --  CKMB -- -- --  TROPONINI -- -- --   Estimated Creatinine Clearance: 44.5 ml/min (by C-G formula based on Cr of 1.03).    Assessment: 77 yo with Afib for Heparin    Goal of Therapy:  Heparin level 0.3-0.7 units/ml   Plan:  Increase Heparin 1100 units/hr to keep in range.  Check heparin level in 8 hours.  Dayveon Halley, Gary Fleet 08/14/2011,6:31 AM

## 2011-08-14 NOTE — Progress Notes (Signed)
  Echocardiogram 2D Echocardiogram has been performed.  Dewitt Hoes, RDCS 08/14/2011, 4:24 PM

## 2011-08-15 ENCOUNTER — Encounter (HOSPITAL_COMMUNITY): Payer: Self-pay | Admitting: Physician Assistant

## 2011-08-15 LAB — BASIC METABOLIC PANEL
Calcium: 9.4 mg/dL (ref 8.4–10.5)
Creatinine, Ser: 1.19 mg/dL — ABNORMAL HIGH (ref 0.50–1.10)
GFR calc non Af Amer: 43 mL/min — ABNORMAL LOW (ref >90)
Glucose, Bld: 353 mg/dL — ABNORMAL HIGH (ref 70–99)
Sodium: 137 mEq/L (ref 135–145)

## 2011-08-15 LAB — GLUCOSE, RANDOM: Glucose, Bld: 420 mg/dL — ABNORMAL HIGH (ref 70–99)

## 2011-08-15 LAB — GLUCOSE, CAPILLARY: Glucose-Capillary: 462 mg/dL — ABNORMAL HIGH (ref 70–99)

## 2011-08-15 LAB — CBC
Hemoglobin: 12.1 g/dL (ref 12.0–15.0)
MCH: 29.4 pg (ref 26.0–34.0)
MCHC: 32.6 g/dL (ref 30.0–36.0)

## 2011-08-15 MED ORDER — ASPIRIN 325 MG PO TBEC: 325.0000 mg | DELAYED_RELEASE_TABLET | Freq: Every day | ORAL | Status: DC

## 2011-08-15 MED ORDER — INSULIN ASPART 100 UNIT/ML ~~LOC~~ SOLN
0.0000 [IU] | Freq: Three times a day (TID) | SUBCUTANEOUS | Status: DC
  Administered 2011-08-15: 15 [IU] via SUBCUTANEOUS
  Filled 2011-08-15: qty 3

## 2011-08-15 MED ORDER — INSULIN ASPART 100 UNIT/ML ~~LOC~~ SOLN
0.0000 [IU] | Freq: Once | SUBCUTANEOUS | Status: AC
  Administered 2011-08-15: 11 [IU] via SUBCUTANEOUS
  Filled 2011-08-15: qty 3

## 2011-08-15 MED ORDER — INSULIN GLARGINE 100 UNIT/ML ~~LOC~~ SOLN
15.0000 [IU] | Freq: Every day | SUBCUTANEOUS | Status: DC
  Filled 2011-08-15: qty 3

## 2011-08-15 MED ORDER — DILTIAZEM HCL ER COATED BEADS 120 MG PO CP24: 120.0000 mg | ORAL_CAPSULE | Freq: Every day | ORAL | Status: DC

## 2011-08-15 NOTE — Progress Notes (Signed)
Brittney [name] made aware and stated it was ok to d/c home. Family notified and ready to take pt home. Assessment unchanged from this am and pt stable. D/c'd home via wheelchair to private vehicle

## 2011-08-15 NOTE — Discharge Summary (Signed)
Discharge Summary   Patient ID: Brittney [name] MRN: [id number], DOB/AGE: 05/23/1932 77 y.o. Admit date: 08/13/2011 D/C date:     08/15/2011   Primary Discharge Diagnoses:  1. Atrial flutter with RVR this admission - history of atrial flutter in the past - s/p PPM [employer] 05/2009 2. History of atrial fibrillation - patient declined Coumadin as she has in the past -TEE cardioversion done 04/30/09 - history of beta blockers lowering blood pressure excessively without heart rate control  3. History of tachycardia induced cardiomyopathy, improved - previous EF 30%  - most recently 55-60% by echo 08/14/11 4. CKD (chronic kidney disease), stage III - d/c Cr 1.19 - hx ARI 04/2009 with cath   Secondary Discharge Diagnoses:  1. Hypertension  2. Dyslipidemia  3. Diabetes mellitus 4. CAD (coronary artery disease)  - Non-STEMI August, 2010 - 40% left main, 30% LAD, 60% OM 2, 40% RCA, 90% OM1-culprit lesion-too small for intervention  5. Question of early dementia 6. Hypothyroidism  7. PVD - carotid artery disease s/p R CEA  - stents right peroneal, right superficial, right popliteal arteries, Dr. Edilia Bo  8. Amputation right midfoot 2010  9. Gait disorder  10. Vertebrobasilar insufficiency   11. Depression   12. Hx of hyperkalemia - ACE Inhibitor d/c'd then 13. Blindness of right eye  14. Respiratory failure  - hypoxic and hypercapnic, 2011, etiology pneumonia  15. Orthostatic hypotension  - syncope January 2012 with facial trauma, amiodarone  16. Ventricular tachycardia - 20 beats December, 2012, asymptomatic  17. Bradycardia, severe sinus 05/2009 - cardioversion, chest compressions, pacemaker placed 18. Anemia in CKD (chronic kidney disease)  - normal Hgb/Hct this admission 19. Gastroparesis  - 3/11 abnormal GES   Hospital Course: Brittney [name] is a pleasant 77/yo F with a complex PMH including CAD, atrial fib/flutter, & PPM implantation who presented initially to [facility] c/o headache,  sore throat, fullness in ears, and heart palpitations. In general she had not felt well the last few days. She was noted to be in atrial flutter with variable conduction, 120s-130s HR. She was placed on IV Cardizem and transferred to [facility]. At time of cardiology evaluation, she was resting comfortably with no further complaints. CXR showed no acute infiltrates. She was afebrile. Flu screen was negative. Cardiac enzymes were negative. Overnight she appeared to transition to a sinus/atrial paced rhythm and maintained this for the remainder of her hospitalization. IV Diltiazem was changed to po. Her URI symptoms were felt likely viral, and continued to improve with supportive care. In line with her previous wishes, she declined Coumadin and verbalized understanding of risk of embolic CVA. Today she is feeling well without complaints of CP, SOB. She does note some ongoing L shoulder discomfort that she has been evaluated for by PCP (plain film showing mild degenerative changes of glenohumeral joint last month); there was no evidence for cellulitis or joint effusion by exam. Dr. Myrtis Ser has seen & examined her today and feels she is stable for discharge.  Per discussion with him, we will increase aspirin to 325mg  daily, keep Lasix at current dose, and continue her newly started diltiazem.  Discharge Vitals: Blood pressure 159/72, pulse 64, temperature 97.7 F (36.5 C), temperature source Oral, resp. rate 18, height 5\' 7"  (1.702 m), weight 157 lb 10.1 oz (71.5 kg), SpO2 96.00%.  Labs: Lab Results  Component Value Date   WBC 9.5 08/15/2011   HGB 12.1 08/15/2011   HCT 37.1 08/15/2011   MCV 90.0 08/15/2011  PLT 291 08/15/2011    Lab 08/15/11 0605 08/13/11 1349  NA 137 --  K 3.9 --  CL 97 --  CO2 33* --  BUN 36* --  CREATININE 1.19* --  CALCIUM 9.4 --  PROT -- 7.3  BILITOT -- 0.5  ALKPHOS -- 104  ALT -- 12  AST -- 18  GLUCOSE 353* --    Basename 08/14/11 1153  CKTOTAL 29  CKMB 2.3    TROPONINI <0.30   Lab Results  Component Value Date   CHOL  Value: 210        ATP III CLASSIFICATION:  <200     mg/dL   Desirable  161-096  mg/dL   Borderline High  >=045    mg/dL   High       * 12/15/8117   HDL 28* 10/21/2010   LDLCALC  Value: 136        Total Cholesterol/HDL:CHD Risk Coronary Heart Disease Risk Table                     Men   Women  1/2 Average Risk   3.4   3.3  Average Risk       5.0   4.4  2 X Average Risk   9.6   7.1  3 X Average Risk  23.4   11.0        Use the calculated Patient Ratio above and the CHD Risk Table to determine the patient's CHD Risk.        ATP III CLASSIFICATION (LDL):  <100     mg/dL   Optimal  147-829  mg/dL   Near or Above                    Optimal  130-159  mg/dL   Borderline  562-130  mg/dL   High  >865     mg/dL   Very High* 7/84/6962   TRIG 228* 10/21/2010   Lab Results  Component Value Date   DDIMER  Value: 0.66        AT THE INHOUSE ESTABLISHED CUTOFF VALUE OF 0.48 ug/mL FEU, THIS ASSAY HAS BEEN DOCUMENTED IN THE LITERATURE TO HAVE A SENSITIVITY AND NEGATIVE PREDICTIVE VALUE OF AT LEAST 98 TO 99%.  THE TEST RESULT SHOULD BE CORRELATED WITH AN ASSESSMENT OF THE CLINICAL PROBABILITY OF DVT / VTE.* 10/15/2010    Diagnostic Studies/Procedures:  1. Chest Portable 1 View - 08/13/2011 *RADIOLOGY REPORT* Clinical Data: Chest pain. Nonsmoker. Shortness of breath. PORTABLE CHEST - 1 VIEW Comparison: 10/20/2010. Findings: Sequential pacemaker enters from the left with leads unchanged in position. Mild cardiomegaly. Central pulmonary vascular prominence. No infiltrate, congestive heart failure or pneumothorax. IMPRESSION: Chest central pulmonary vascular prominence. Pacemaker in place with cardiomegaly. Calcified aorta.   2. 2D Echo 08/14/11 - - Left ventricle: The cavity size was normal. Wall thickness was increased in a pattern of mild LVH. Systolic function was normal. The estimated ejection fraction was in the range of 55% to 60%. - Mitral valve: Mild  regurgitation.   Discharge Medications   Current Discharge Medication List    START taking these medications   Details  diltiazem (CARDIZEM CD) 120 MG 24 hr capsule Take 1 capsule (120 mg total) by mouth daily. Qty: 30 capsule, Refills: 6      CONTINUE these medications which have CHANGED   Details  aspirin EC 325 MG EC tablet Take 1 tablet (325 mg total) by mouth daily.  CONTINUE these medications which have NOT CHANGED   Details  acetaminophen (TYLENOL) 500 MG tablet Take 500 mg by mouth every 6 (six) hours as needed.      Ascorbic Acid (VITAMIN C) 1000 MG tablet Take 1,000 mg by mouth daily.      furosemide (LASIX) 40 MG tablet Take 1 tablet (40 mg total) by mouth daily. Qty: 30 tablet, Refills: 6    insulin aspart (NOVOLOG) 100 UNIT/ML injection Inject into the skin as directed. Per sliding scale     insulin glargine (LANTUS) 100 UNIT/ML injection Inject 15 Units into the skin at bedtime.      levothyroxine (SYNTHROID, LEVOTHROID) 75 MCG tablet Take 75 mcg by mouth daily.      linagliptin (TRADJENTA) 5 MG TABS tablet Take 5 mg by mouth daily.      midodrine (PROAMATINE) 5 MG tablet take 1 tablet by mouth twice a day Qty: 60 tablet, Refills: 6    pyridOXINE (VITAMIN B-6) 100 MG tablet Take 100 mg by mouth daily.      sertraline (ZOLOFT) 100 MG tablet Take 100 mg by mouth daily.      travoprost, benzalkonium, (TRAVATAN) 0.004 % ophthalmic solution Place 1 drop into both eyes at bedtime.      nitroGLYCERIN (NITROSTAT) 0.4 MG SL tablet Place 0.4 mg under the tongue every 5 (five) minutes as needed.        STOP taking these medications     aspirin 81 MG tablet         Disposition   The patient will be discharged in stable condition to home. Discharge Orders    Future Appointments: Provider: Department: Dept Phone: Center:   09/04/2011 1:15 PM [name], MD [facility] [phone] [facility]     Future Orders Please Complete By Expires    Diet - low sodium heart healthy      Increase activity slowly        Follow-up Information    Follow up with Willa Rough, MD. (09/04/11 at  1:15pm)    Contact information:   [facility] [phone]       Follow up with Primary Care Doctor. (To re-evaluate shoulder pain as well as discuss your diabetes medicines since your sugars tended to run high in the hospital)            Duration of Discharge Encounter: Greater than 30 minutes including physician and PA time.  Signed, Ronie Spies PA-C 08/15/2011, 10:32 AM  See my note from earlier today also.

## 2011-08-15 NOTE — Progress Notes (Signed)
Patient Name: Brittney [name] Date of Encounter: 08/15/2011, 9:09 AM     Subjective  No CP, SOB, orthopnea, palpitations. Slept well and overall feels well. No more sore throat. She notes she's had intermittent L shoulder discomfort when trying to reach overhead over the past 2 weeks, and has been following with her PCP for this.   Objective   Telemetry: atrial paced  Physical Exam: Filed Vitals:   08/15/11 0630  BP: 159/72  Pulse: 64  Temp: 97.7 F (36.5 C)  Resp: 18   General: Well developed, well nourished, in no acute distress. Head: Normocephalic, atraumatic, sclera non-icteric, no xanthomas, nares are without discharge.  Neck: Negative for carotid bruits. JVD not elevated. Lungs: Coarse at bases but otherwise clear to auscultation without wheezes or rhonchi. Breathing is unlabored. Heart: RRR S1 S2 without murmurs, rubs, or gallops.  Abdomen: Soft, non-tender, non-distended with normoactive bowel sounds. No hepatomegaly. No rebound/guarding. No obvious abdominal masses. Msk:  Strength and tone appear normal for age, with the exception of limited overhead reaching L arm. Extremities: No clubbing or cyanosis. No edema.  Distal pedal pulses are 2+ and equal bilaterally. Neuro: Alert and oriented X 3. Moves all extremities spontaneously. Psych:  Responds to questions appropriately with a normal affect.   Intake/Output Summary (Last 24 hours) at 08/15/11 0909 Last data filed at 08/14/11 1300  Gross per 24 hour  Intake  385.5 ml  Output      0 ml  Net  385.5 ml    Labs:  [facility] 08/15/11 0605 08/14/11 0538  NA 137 137  K 3.9 3.5  CL 97 97  CO2 33* 32  GLUCOSE 353* 296*  BUN 36* 30*  CREATININE 1.19* 1.21*  CALCIUM 9.4 9.4  MG -- --  PHOS -- --    Basename 08/13/11 1349  AST 18  ALT 12  ALKPHOS 104  BILITOT 0.5  PROT 7.3  ALBUMIN 2.9*    Basename 08/15/11 0605 08/14/11 0538  WBC 9.5 12.4*  NEUTROABS -- --  HGB 12.1 12.8  HCT 37.1 38.7  MCV 90.0 89.6   PLT 291 293    Basename 08/14/11 1153  CKTOTAL 29  CKMB 2.3  TROPONINI <0.30    Basename 08/13/11 1349  POCBNP 2562.0*    Basename 08/13/11 2113  TSH 2.471  T4TOTAL --  T3FREE --  THYROIDAB --   Scheduled Meds:   . aspirin  81 mg Oral Daily  . Chlorhexidine Gluconate Cloth  6 each Topical Q0600  . diltiazem  120 mg Oral Daily  . furosemide  40 mg Oral Daily  . insulin aspart  0-9 Units Subcutaneous TID WC  . insulin glargine  10 Units Subcutaneous QHS  . levothyroxine  75 mcg Oral QAC breakfast  . linagliptin  5 mg Oral Daily  . midodrine  5 mg Oral BID WC  . mupirocin ointment  1 application Nasal BID  . pyridOXINE  100 mg Oral Daily  . sertraline  100 mg Oral Daily  . sodium chloride  3 mL Intravenous Q12H  . Travoprost (BAK Free)  1 drop Both Eyes QHS  . vitamin C  1,000 mg Oral Daily   Continuous Infusions:  PRN Meds:.sodium chloride, acetaminophen, acetaminophen, alum & mag hydroxide-simeth, guaiFENesin-dextromethorphan, magnesium hydroxide, ondansetron (ZOFRAN) IV, ondansetron, pramoxine-mineral oil-zinc, sodium chloride   Radiology/Studies:  1. Chest Portable 1 View - 08/13/2011  *RADIOLOGY REPORT*  Clinical Data: Chest pain.  Nonsmoker. Shortness of breath.  PORTABLE CHEST - 1 VIEW  Comparison: 10/20/2010.  Findings: Sequential pacemaker enters from the left with leads unchanged in position.  Mild cardiomegaly.  Central pulmonary vascular prominence.  No infiltrate, congestive heart failure or pneumothorax.  IMPRESSION: Chest central pulmonary vascular prominence.  Pacemaker in place with cardiomegaly.  Calcified aorta.   2. 2D Echo 08/14/11 - - Left ventricle: The cavity size was normal. Wall thickness was increased in a pattern of mild LVH. Systolic function was normal. The estimated ejection fraction was in the range of 55% to 60%. - Mitral valve: Mild regurgitation.    Assessment and Plan  1. Atrial flutter with history of afib/flutter, maintaining atrial  paced rhythm without further evidence for flutter. Echo as above. - has declined coumadin and understands risk of CVA, on ASA 81mg  (?increase to 325mg  daily at discharge) - Continue po cardizem at current dose - may need consideration for ablation in future per Dr. Jens Som (has not tolerated amiodarone in the past)  2. Possible viral URI (sore throat, fatigue). Negative flu screen, afebrile. WBC normal today (slightly elevated yesterday) - supportive care, no indication for abx  3. Chronic renal insufficiency - Cr stable today (Cr 1.15-1.57 in Feb 2012), although BUN increasing - consider backing down on maintenance Lasix  4. Diabetes mellitus - CBG's running quite high - will increase Lantus to home dose and SSI to moderate - continue Tradjenta - f/u PCP as OP to optimize therapy.  5. L shoulder pain - plain film 07/14/11 shows mild degenerative changes of glenohumeral joint. No evidence for cellulitis or joint effusion.  - f/u PCP for possible referral to orthopedics +/- physical therapy (patient already plans to)  May be stable for discharge today - will await Dr. Henrietta Hoover decision.  Signed, Ronie Spies PA-C Patient seen and examined. I agree with the assessment and plan as detailed above. See also my additional thoughts below.   Patient feels better. We dicussed her arm pain. She needs to continue f/u with her primary MD.  Willa Rough, MD, [facility] 08/15/2011 9:50 AM

## 2011-08-15 NOTE — Progress Notes (Signed)
Pt's capillary glucose over 400, notified Danya Dunn and orders received. Stat glucose done and Dayna paged to be given results. Awaiting return of call

## 2011-08-26 ENCOUNTER — Other Ambulatory Visit: Payer: Self-pay

## 2011-08-26 ENCOUNTER — Encounter (HOSPITAL_COMMUNITY): Payer: Self-pay

## 2011-08-26 ENCOUNTER — Emergency Department (HOSPITAL_COMMUNITY): Payer: Medicare Other

## 2011-08-26 ENCOUNTER — Inpatient Hospital Stay (HOSPITAL_COMMUNITY)
Admission: EM | Admit: 2011-08-26 | Discharge: 2011-08-29 | DRG: 310 | Disposition: A | Discharge status: Home / Self Care | Payer: Medicare Other | Attending: Internal Medicine | Admitting: Internal Medicine

## 2011-08-26 DIAGNOSIS — E785 Hyperlipidemia, unspecified: Secondary | ICD-10-CM | POA: Diagnosis present

## 2011-08-26 DIAGNOSIS — Z950 Presence of cardiac pacemaker: Secondary | ICD-10-CM

## 2011-08-26 DIAGNOSIS — I4892 Unspecified atrial flutter: Principal | ICD-10-CM | POA: Diagnosis present

## 2011-08-26 DIAGNOSIS — E119 Type 2 diabetes mellitus without complications: Secondary | ICD-10-CM | POA: Diagnosis present

## 2011-08-26 DIAGNOSIS — I2510 Atherosclerotic heart disease of native coronary artery without angina pectoris: Secondary | ICD-10-CM | POA: Diagnosis present

## 2011-08-26 DIAGNOSIS — N189 Chronic kidney disease, unspecified: Secondary | ICD-10-CM | POA: Diagnosis present

## 2011-08-26 DIAGNOSIS — I471 Supraventricular tachycardia: Secondary | ICD-10-CM

## 2011-08-26 DIAGNOSIS — F0390 Unspecified dementia without behavioral disturbance: Secondary | ICD-10-CM | POA: Diagnosis present

## 2011-08-26 DIAGNOSIS — I10 Essential (primary) hypertension: Secondary | ICD-10-CM | POA: Diagnosis present

## 2011-08-26 DIAGNOSIS — I129 Hypertensive chronic kidney disease with stage 1 through stage 4 chronic kidney disease, or unspecified chronic kidney disease: Secondary | ICD-10-CM | POA: Diagnosis present

## 2011-08-26 DIAGNOSIS — E039 Hypothyroidism, unspecified: Secondary | ICD-10-CM | POA: Diagnosis present

## 2011-08-26 DIAGNOSIS — R000 Tachycardia, unspecified: Secondary | ICD-10-CM

## 2011-08-26 DIAGNOSIS — I4891 Unspecified atrial fibrillation: Secondary | ICD-10-CM

## 2011-08-26 DIAGNOSIS — Z79899 Other long term (current) drug therapy: Secondary | ICD-10-CM | POA: Diagnosis not present

## 2011-08-26 DIAGNOSIS — S98919A Complete traumatic amputation of unspecified foot, level unspecified, initial encounter: Secondary | ICD-10-CM

## 2011-08-26 HISTORY — DX: Other long term (current) drug therapy: Z79.899

## 2011-08-26 LAB — CBC
HCT: 40 % (ref 36.0–46.0)
MCH: 29.9 pg (ref 26.0–34.0)
MCV: 89.9 fL (ref 78.0–100.0)
RBC: 4.45 MIL/uL (ref 3.87–5.11)
WBC: 10.5 10*3/uL (ref 4.0–10.5)

## 2011-08-26 LAB — BASIC METABOLIC PANEL
BUN: 30 mg/dL — ABNORMAL HIGH (ref 6–23)
CO2: 27 mEq/L (ref 19–32)
Calcium: 9.6 mg/dL (ref 8.4–10.5)
Chloride: 97 mEq/L (ref 96–112)
Creatinine, Ser: 1.17 mg/dL — ABNORMAL HIGH (ref 0.50–1.10)

## 2011-08-26 MED ORDER — SERTRALINE HCL 100 MG PO TABS
100.0000 mg | ORAL_TABLET | Freq: Every day | ORAL | Status: DC
  Administered 2011-08-27 – 2011-08-29 (×3): 100 mg via ORAL
  Filled 2011-08-26 (×3): qty 1

## 2011-08-26 MED ORDER — ADENOSINE 6 MG/2ML IV SOLN
INTRAVENOUS | Status: AC
  Filled 2011-08-26: qty 4

## 2011-08-26 MED ORDER — DILTIAZEM HCL ER COATED BEADS 120 MG PO CP24
120.0000 mg | ORAL_CAPSULE | Freq: Every day | ORAL | Status: DC
  Filled 2011-08-26: qty 1

## 2011-08-26 MED ORDER — INSULIN ASPART 100 UNIT/ML ~~LOC~~ SOLN
0.0000 [IU] | Freq: Three times a day (TID) | SUBCUTANEOUS | Status: DC
  Administered 2011-08-27: 8 [IU] via SUBCUTANEOUS
  Administered 2011-08-27: 11 [IU] via SUBCUTANEOUS
  Administered 2011-08-28: 15 [IU] via SUBCUTANEOUS
  Administered 2011-08-28: 8 [IU] via SUBCUTANEOUS
  Administered 2011-08-28: 15 [IU] via SUBCUTANEOUS
  Administered 2011-08-29: 5 [IU] via SUBCUTANEOUS
  Administered 2011-08-29: 2 [IU] via SUBCUTANEOUS
  Filled 2011-08-26: qty 3

## 2011-08-26 MED ORDER — ROSUVASTATIN CALCIUM 20 MG PO TABS
20.0000 mg | ORAL_TABLET | Freq: Every day | ORAL | Status: DC
  Administered 2011-08-27 – 2011-08-29 (×3): 20 mg via ORAL
  Filled 2011-08-26 (×3): qty 1

## 2011-08-26 MED ORDER — MIDODRINE HCL 5 MG PO TABS
5.0000 mg | ORAL_TABLET | Freq: Two times a day (BID) | ORAL | Status: DC
  Administered 2011-08-27 – 2011-08-29 (×5): 5 mg via ORAL
  Filled 2011-08-26 (×7): qty 1

## 2011-08-26 MED ORDER — NITROGLYCERIN 0.4 MG SL SUBL: 0.4000 mg | SUBLINGUAL_TABLET | SUBLINGUAL | Status: DC | PRN

## 2011-08-26 MED ORDER — ONDANSETRON HCL 4 MG/2ML IJ SOLN: 4.0000 mg | Freq: Four times a day (QID) | INTRAMUSCULAR | Status: DC | PRN

## 2011-08-26 MED ORDER — FUROSEMIDE 40 MG PO TABS
40.0000 mg | ORAL_TABLET | Freq: Every day | ORAL | Status: DC
  Administered 2011-08-27 – 2011-08-29 (×3): 40 mg via ORAL
  Filled 2011-08-26 (×3): qty 1

## 2011-08-26 MED ORDER — INSULIN GLARGINE 100 UNIT/ML ~~LOC~~ SOLN
15.0000 [IU] | Freq: Every day | SUBCUTANEOUS | Status: DC
  Administered 2011-08-27 (×2): 15 [IU] via SUBCUTANEOUS
  Filled 2011-08-26: qty 3

## 2011-08-26 MED ORDER — DILTIAZEM HCL 100 MG IV SOLR
5.0000 mg/h | Freq: Once | INTRAVENOUS | Status: AC
  Administered 2011-08-26: 5 mg/h via INTRAVENOUS

## 2011-08-26 MED ORDER — SODIUM CHLORIDE 0.9 % IV BOLUS (SEPSIS)
500.0000 mL | Freq: Once | INTRAVENOUS | Status: AC
  Administered 2011-08-26: 500 mL via INTRAVENOUS

## 2011-08-26 MED ORDER — LINAGLIPTIN 5 MG PO TABS
5.0000 mg | ORAL_TABLET | Freq: Every day | ORAL | Status: DC
  Administered 2011-08-27 – 2011-08-29 (×3): 5 mg via ORAL
  Filled 2011-08-26 (×3): qty 1

## 2011-08-26 MED ORDER — ADENOSINE 12 MG/4ML IV SOLN
12.0000 mg | Freq: Once | INTRAVENOUS | Status: AC
  Administered 2011-08-26: 12 mg via INTRAVENOUS

## 2011-08-26 MED ORDER — ACETAMINOPHEN 325 MG PO TABS: 650.0000 mg | ORAL_TABLET | ORAL | Status: DC | PRN

## 2011-08-26 MED ORDER — ENOXAPARIN SODIUM 40 MG/0.4ML ~~LOC~~ SOLN
40.0000 mg | Freq: Every day | SUBCUTANEOUS | Status: DC
  Administered 2011-08-27 – 2011-08-29 (×3): 40 mg via SUBCUTANEOUS
  Filled 2011-08-26 (×3): qty 0.4

## 2011-08-26 MED ORDER — DILTIAZEM HCL 25 MG/5ML IV SOLN: 10.0000 mg | Freq: Once | INTRAVENOUS | Status: DC

## 2011-08-26 MED ORDER — TRAVOPROST (BAK FREE) 0.004 % OP SOLN
1.0000 [drp] | Freq: Every day | OPHTHALMIC | Status: DC
  Administered 2011-08-27 – 2011-08-28 (×3): 1 [drp] via OPHTHALMIC
  Filled 2011-08-26: qty 2.5

## 2011-08-26 MED ORDER — ASPIRIN EC 81 MG PO TBEC
81.0000 mg | DELAYED_RELEASE_TABLET | Freq: Every day | ORAL | Status: DC
  Administered 2011-08-27 – 2011-08-29 (×3): 81 mg via ORAL
  Filled 2011-08-26 (×3): qty 1

## 2011-08-26 MED ORDER — SODIUM CHLORIDE 0.9 % IJ SOLN
3.0000 mL | Freq: Two times a day (BID) | INTRAMUSCULAR | Status: DC
  Administered 2011-08-27 – 2011-08-29 (×6): 3 mL via INTRAVENOUS

## 2011-08-26 MED ORDER — SODIUM CHLORIDE 0.9 % IV SOLN: 250.0000 mL | INTRAVENOUS | Status: DC | PRN

## 2011-08-26 MED ORDER — LEVOTHYROXINE SODIUM 75 MCG PO TABS
75.0000 ug | ORAL_TABLET | Freq: Every day | ORAL | Status: DC
  Administered 2011-08-27 – 2011-08-29 (×3): 75 ug via ORAL
  Filled 2011-08-26 (×4): qty 1

## 2011-08-26 MED ORDER — SOTALOL HCL 120 MG PO TABS
120.0000 mg | ORAL_TABLET | Freq: Two times a day (BID) | ORAL | Status: DC
  Administered 2011-08-27 (×3): 120 mg via ORAL
  Filled 2011-08-26 (×5): qty 1

## 2011-08-26 MED ORDER — INSULIN ASPART 100 UNIT/ML ~~LOC~~ SOLN: 5.0000 [IU] | Freq: Every day | SUBCUTANEOUS | Status: DC

## 2011-08-26 NOTE — ED Provider Notes (Signed)
Medical screening examination/treatment/procedure(s) were conducted as a shared visit with resident practitioner(s) and myself.  I personally evaluated the patient during the encounter  Chest pain with associated palpitations and dyspnea. Started this morning it awoke her from sleep. She has a history of proximal atrial fibrillation/flutter. Had a pacemaker placed a recent hospitalization. She is followed by [facility] cardiology  Tachycardic rate but regular rhythm. No murmurs, rubs, gallops. She did enter supraventricular tachycardia one emergency department which converted with adenosine. Her pacer than began pacing. Lungs clear to auscultation bilaterally. No wheeze, rales, rhonchi. Abdomen soft, nontender, nondistended. No edema in her legs  Labs, chest x-ray, diltiazem to control rate, cardiology consult for admission.  CRITICAL CARE Performed by: Dayton Bailiff   Total critical care time: 30 min  Critical care time was exclusive of separately billable procedures and treating other patients.  Critical care was necessary to treat or prevent imminent or life-threatening deterioration.  Critical care was time spent personally by me on the following activities: development of treatment plan with patient and/or surrogate as well as nursing, discussions with consultants, evaluation of patient's response to treatment, examination of patient, obtaining history from patient or surrogate, ordering and performing treatments and interventions, ordering and review of laboratory studies, ordering and review of radiographic studies, pulse oximetry and re-evaluation of patient's condition.    Dayton Bailiff, MD 08/26/11 [phone]

## 2011-08-26 NOTE — ED Notes (Addendum)
Pt noted to be HR 65 SR, showing pacemaker activity on monitor. Denying any CP at this time. 140/63. Will continue to monitor pt. Family at bedside

## 2011-08-26 NOTE — ED Notes (Signed)
Pt presents with onset of midsternal chest pain that began this morning.  Pt reports headache, denies any shortness of breath or nausea.  Daughter reports taking pt's HR prior to arrival, with HR 145 down to 122. Pt with h/o Afib.

## 2011-08-26 NOTE — ED Notes (Signed)
2038-01 READY

## 2011-08-26 NOTE — ED Notes (Signed)
Placed call for diet tray 

## 2011-08-26 NOTE — ED Notes (Signed)
CBG 232. 

## 2011-08-26 NOTE — ED Notes (Signed)
Pt noted to be in SVT heart rate 202. Resident Oncologist, RN at bedside. Zoll attached to patient. Pt given 12 mg Adenosine at 1636, arm held up immediately after, followed by saline flushes. Pt converted back into normal rhythm 1638 SR, pt pacemaker appearing to be pacing correctly according to monitor. Pt has HR 6, 119/90. Reporting "feeling much better". Rhythm strips printed and placed in pt box.

## 2011-08-26 NOTE — ED Provider Notes (Signed)
History     CSN: [id number] Arrival date & time: 08/26/2011  2:01 PM   First MD Initiated Contact with Patient 08/26/11 1505      Chief Complaint  Patient presents with  . Chest Pain    (Consider location/radiation/quality/duration/timing/severity/associated sxs/prior treatment) The history is provided by the patient and a relative.   Patient is a 77 year old female with a history of paroxysmal atrial flutter, sinus tach (pacemaker in place, recent hospitalization), and tachycardia-induced cardiomyopathy who presents with palpitations, chest pain, dyspnea.  This started this morning. It awoke her from her sleep. It has been constant since this morning. It waxes and wanes. She does have mild lightheadedness at times but has not had syncope. No altered mental status. Patient denies chest pain since discharge but has overall had less activity/moderate fatigue. Nothing noted to make it better or worse. She denies recent fever, URI symptoms, cough, dysuria, decreased by mouth intake, medication noncompliance. Overall severity described as moderate.  Past Medical History  Diagnosis Date  . Hypertension   . Dyslipidemia   . CAD (coronary artery disease)     Non-STEMI August, 2010,,,, 40% left main, 30% LAD, 60% OM 2, 40% RCA, 90% OM1-culprit lesion-too small for intervention           . Ejection fraction     EF 55% ,2011 / EF 30% with tachycardia cardiomyopathy / EF 50% January, 2012  . Diabetes mellitus   . Dementia     ?? Early dementia ?? 2012  . GERD (gastroesophageal reflux disease)   . TIA (transient ischemic attack)     History of TIAs  . Hypothyroidism   . PAD (peripheral artery disease)     Carotid endarterectomy, right. Stents right peroneal, right superficial, right popliteal arteries, Dr. Edilia Bo  . Amputation     Right midfoot 2010  . CKD (chronic kidney disease)     Creatinine 1.19 at December 2012 discharge  . Gait disorder   . Vertebrobasilar insufficiency   .  Depression   . Hyperkalemia     ACE Inhibitor held  . Acute renal insufficiency     Catheterization August 2010  . Blindness of right eye   . Respiratory failure     Hypoxic and hypercapnic, 2011, etiology pneumonia  . Leukocytosis 5/11    Persistent post pneumonia  . Orthostatic hypotension     Syncope January 2012 with facial trauma, amiodarone stopped  . Tachycardia induced cardiomyopathy     Previous EF 30%.  Most recently 55-60% by echo 08/14/11  . Atrial fibrillation 8/10    Coumadin use in the past.  Patient decided August, 2012,That she did not want Coumadin restart  /    Rapid rate; Coumadin started TE vardioversion done 04/30/09; amiodarone, beta blockers during hospitalization 8/10; beta blockers lower blood pressure excessively without heart rate control  . Ventricular tachycardia     20 beats December, 2012, asymptomatic  . Atrial flutter 05/24/09    Hospitalization in [place]; with difficult rate control; TEE cardioversion done EF 30%, secondary to tachycardia, pt did convert back tosinus rhythm; return 10/11, amiodarone restarted 10/11, no longer on it. Recurrence with RVR 08/2011.  . Bradycardia, severe sinus 05/31/09    [facility]; cardioversion, chest compressions, pacemaker placed 05/2009 [facility] Jude)  . Anemia in CKD (chronic kidney disease)   . Gastroparesis 3/11    Abnormal GES    Past Surgical History  Procedure Date  . Total abdominal hysterectomy   . Cholecystectomy   .  Carotid endarterectomy   . Amputation 2010    RIGHT MIDFOOT  . Tonsillectomy   . Eye surgeries     Several right  . Pacemaker insertion     SJM Pacemaker implant 9/10  . Pci stent to the right peroneal and right superficial as well as right popliteal arteries     Dr. Edilia Bo    Family History  Problem Relation Age of Onset  . Cancer Brother     Colon cancer  . Aortic aneurysm Mother   . Colon polyps Neg Hx   . Liver disease Neg Hx     And no CRC    History  Substance  Use Topics  . Smoking status: Never Smoker   . Smokeless tobacco: Never Used  . Alcohol Use: No    OB History    Grav Para Term Preterm Abortions TAB SAB Ect Mult Living                  Review of Systems  Constitutional: Negative for fever and chills.  HENT: Negative for facial swelling.   Eyes: Negative for visual disturbance.  Respiratory: Negative for cough, chest tightness, shortness of breath and wheezing.   Cardiovascular: Positive for chest pain and palpitations. Negative for leg swelling.  Gastrointestinal: Negative for nausea, vomiting, abdominal pain and diarrhea.  Genitourinary: Negative for difficulty urinating.  Skin: Negative for rash.  Neurological: Negative for weakness and numbness.  Psychiatric/Behavioral: Negative for behavioral problems and confusion.  All other systems reviewed and are negative.    Allergies  Morphine sulfate and Oxycodone hcl  Home Medications   Current Outpatient Rx  Name Route Sig Dispense Refill  . ACETAMINOPHEN 500 MG PO TABS Oral Take 500 mg by mouth every 6 (six) hours as needed. For pain    . VITAMIN C 1000 MG PO TABS Oral Take 1,000 mg by mouth daily.      . ASPIRIN 325 MG PO TBEC Oral Take 1 tablet (325 mg total) by mouth daily.    [name] DILTIAZEM HCL ER COATED BEADS 120 MG PO CP24 Oral Take 1 capsule (120 mg total) by mouth daily. 30 capsule 6  . FUROSEMIDE 40 MG PO TABS Oral Take 1 tablet (40 mg total) by mouth daily. 30 tablet 6  . INSULIN ASPART 100 UNIT/ML [place] SOLN Subcutaneous Inject 5-9 Units into the skin 5 (five) times daily. Per sliding scale.  90-200: 5 units, 201-250: 6 units, 251-300: 7 units, 301-350: 8 units, 351-400: 9 units    . INSULIN GLARGINE 100 UNIT/ML [place] SOLN Subcutaneous Inject 15 Units into the skin at bedtime.      [name] LEVOTHYROXINE SODIUM 75 MCG PO TABS Oral Take 75 mcg by mouth daily.      [name] LINAGLIPTIN 5 MG PO TABS Oral Take 5 mg by mouth daily.      [name] MIDODRINE HCL 5 MG PO TABS Oral Take 5 mg by mouth 2  (two) times daily.      [name] NITROGLYCERIN 0.4 MG SL SUBL Sublingual Place 0.4 mg under the tongue every 5 (five) minutes as needed. For chest pain    . VITAMIN B-6 100 MG PO TABS Oral Take 100 mg by mouth daily.      . SERTRALINE HCL 100 MG PO TABS Oral Take 100 mg by mouth daily.      . TRAVOPROST 0.004 % OP SOLN Both Eyes Place 1 drop into both eyes at bedtime.        BP  119/90  Pulse 65  Temp(Src) 98.6 F (37 C) (Oral)  Resp 16  Ht 5\' 7"  (1.702 m)  Wt 162 lb (73.483 kg)  BMI 25.37 kg/m2  SpO2 98%  Physical Exam  Nursing note and vitals reviewed. Constitutional: She is oriented to person, place, and time. She appears well-developed and well-nourished. No distress.  HENT:  Head: Normocephalic.  Nose: Nose normal.  Eyes: EOM are normal.  Neck: Normal range of motion. Neck supple. No JVD present.  Cardiovascular: Normal rate and intact distal pulses.   No murmur heard.      Irregularly irregular, tachycardic  Pulmonary/Chest: Effort normal and breath sounds normal. No respiratory distress. She has no wheezes. She exhibits no tenderness.  Abdominal: Soft. She exhibits no distension. There is no tenderness.  Musculoskeletal: Normal range of motion. She exhibits no edema and no tenderness.       No calf TTP  Neurological: She is alert and oriented to person, place, and time. She displays no Babinski's sign on the left side.       Normal strength  Skin: Skin is warm and dry. No rash noted. She is not diaphoretic.  Psychiatric: She has a normal mood and affect. Her behavior is normal. Thought content normal.    ED Course  Procedures (including critical care time)   Date: 08/26/2011  Rate: 123  Rhythm: sinus tachycardia  QRS Axis: normal  Intervals: normal  ST/T Wave abnormalities: nonspecific ST changes  Conduction Disutrbances:none  Narrative Interpretation: sinus tach with nonspecific st changes  Old EKG Reviewed: changes noted - A paced in old, sinus tach in current    Date: 08/26/2011  Rate: 204  Rhythm: supraventricular tachycardia (SVT)  QRS Axis: right  Intervals: normal  ST/T Wave abnormalities: ST depressions diffusely  Conduction Disutrbances:none  Narrative Interpretation:   Old EKG Reviewed: new SVT    Labs Reviewed  BASIC METABOLIC PANEL - Abnormal; Notable for the following:    Glucose, Bld 246 (*)    BUN 30 (*)    Creatinine, Ser 1.17 (*)    GFR calc non Af Amer 44 (*)    GFR calc Af Amer 51 (*)    All other components within normal limits  GLUCOSE, CAPILLARY - Abnormal; Notable for the following:    Glucose-Capillary 230 (*)    All other components within normal limits  CBC  TROPONIN I  POCT CBG MONITORING   Dg Chest Port 1 View  08/26/2011  *RADIOLOGY REPORT*  Clinical Data: Shortness of breath.  History of atrial fibrillation.  PORTABLE CHEST - 1 VIEW  Comparison: Chest 08/13/2011.  Findings: Pacing device is again noted.  There is cardiomegaly and vascular congestion.  No consolidative process, pneumothorax or effusion is identified.  IMPRESSION: Cardiomegaly and vascular congestion.  Stable compared to prior exam.  Original Report Authenticated By: Bernadene Bell. D'ALESSIO, M.D.     1. SVT (supraventricular tachycardia)   2. Sinus tachycardia       MDM   Patient with extensive history of tachyarrhythmias here with palpitations and tachycardia. She has associated chest pain dyspnea with this but no syncope. No preceding chest pain. Patient has been compliant with medications. Initial EKG shows sinus tachycardia in the 120s. Patient on exam has fluctuating heart rate from low 100s up to 140s. Alert and oriented and mentating well. Normotensive. Labs unremarkable. While patient was in ED, heart rate increased at low 200s. EKG at that time confirmed SVT. Adenosine 12 mg given IV x1. Patient had  resolution of her SVT and stable in paced rhythm. Remains normotensive. Cardiology consulted and will admit.        Milus Glazier 08/26/11 1845

## 2011-08-26 NOTE — ED Notes (Addendum)
Pt on monitor. Noted to have HR 120. Pt alert and oriented. Skin warm and dry. Pt talking, responding appropriately.

## 2011-08-26 NOTE — H&P (Signed)
History and Physical   Patient ID: Brittney [name] MRN: [id number], DOB/AGE: 02/24/1932   Admit date: 08/26/2011 Date of Consult: 08/26/2011   Primary Physician: [name], MD Primary Cardiologist: Willa Rough, MD  Pt. Profile: Ms. Brittney [name] is a 77yo female recently discharged on 12/07 with paroxysmal atrial flutter with RVR (on Cardizem outpatient), parox. a fib (not on coumadin, per patient's preferences) and PMHx significant for multiple cardiac arrhythmias including severe sinus bradycardia (s/p cardioversion, chest compressions and [employer] Jude PM placed 09/10), tachycardia induced cardiomyopathy (EF improved from 40% to 55-60% on 08/14/11), VT (20 beat-run on previous admission 12/12) as well as CAD (NSTEMI 08/10- 40% left main, 30% LAD, 60% OM 2, 40% RCA, 90% OM1-culprit lesion-too small for intervention), HTN, HL, type 2 DM presenting to [facility] ED today with palpitations, chest pain and dyspnea. She was subsequently found to have atrial flutter with variable AV conduction. She was treated with IV adenosine with high grade block and spontaneous return to NSR. She has minimal palpitations.  Problem List: Past Medical History  Diagnosis Date  . Hypertension   . Dyslipidemia   . CAD (coronary artery disease)     Non-STEMI August, 2010,,,, 40% left main, 30% LAD, 60% OM 2, 40% RCA, 90% OM1-culprit lesion-too small for intervention           . Ejection fraction     EF 55% ,2011 / EF 30% with tachycardia cardiomyopathy / EF 50% January, 2012  . Diabetes mellitus   . Dementia     ?? Early dementia ?? 2012  . GERD (gastroesophageal reflux disease)   . TIA (transient ischemic attack)     History of TIAs  . Hypothyroidism   . PAD (peripheral artery disease)     Carotid endarterectomy, right. Stents right peroneal, right superficial, right popliteal arteries, Dr. Edilia Bo  . Amputation     Right midfoot 2010  . CKD (chronic kidney disease)     Creatinine 1.19 at December 2012 discharge  .  Gait disorder   . Vertebrobasilar insufficiency   . Depression   . Hyperkalemia     ACE Inhibitor held  . Acute renal insufficiency     Catheterization August 2010  . Blindness of right eye   . Respiratory failure     Hypoxic and hypercapnic, 2011, etiology pneumonia  . Leukocytosis 5/11    Persistent post pneumonia  . Orthostatic hypotension     Syncope January 2012 with facial trauma, amiodarone stopped  . Tachycardia induced cardiomyopathy     Previous EF 30%.  Most recently 55-60% by echo 08/14/11  . Atrial fibrillation 8/10    Coumadin use in the past.  Patient decided August, 2012,That she did not want Coumadin restart  /    Rapid rate; Coumadin started TE vardioversion done 04/30/09; amiodarone, beta blockers during hospitalization 8/10; beta blockers lower blood pressure excessively without heart rate control  . Ventricular tachycardia     20 beats December, 2012, asymptomatic  . Atrial flutter 05/24/09    Hospitalization in [place]; with difficult rate control; TEE cardioversion done EF 30%, secondary to tachycardia, pt did convert back tosinus rhythm; return 10/11, amiodarone restarted 10/11, no longer on it. Recurrence with RVR 08/2011.  . Bradycardia, severe sinus 05/31/09    [facility]; cardioversion, chest compressions, pacemaker placed 05/2009 [facility] Jude)  . Anemia in CKD (chronic kidney disease)   . Gastroparesis 3/11    Abnormal GES    Past Surgical History  Procedure Date  .  Total abdominal hysterectomy   . Cholecystectomy   . Carotid endarterectomy   . Amputation 2010    RIGHT MIDFOOT  . Tonsillectomy   . Eye surgeries     Several right  . Pacemaker insertion     SJM Pacemaker implant 9/10  . Pci stent to the right peroneal and right superficial as well as right popliteal arteries     Dr. Edilia Bo     Allergies:  Allergies  Allergen Reactions  . Morphine Sulfate     REACTION: jerking  . Oxycodone Hcl Itching and Other (See Comments)    Feels  weird    HPI:   She reports decreased acitivity and fatigue since recent discharge. Early this morning, she was awoken from sleep with palpitations with associated dizziness, shortness of breath and substernal chest pain. She describes the chest pain as substernal without radiation and constant. She was unable to qualify it further. No aggravating/alleviating factors. She denies increase in stress, alcohol/caffeine consumption or supplement use. She denies n/v abdominal pain, diaphoresis, fevers, chills and swelling. She has been taking all medications as prescribed.   She presented to [facility] ED where EKG revealed atrial flutter at 123 bpm, without ST-T wave changes. She progressed to 1:1 atrial flutter with rate in low 200s, give adenosine with conversion to NSR. EKG post-adenosine revealed ST depression V5, V6, I and aVL She is currently NSR with occasional pacing. CXR revealed cardiomegaly and vascular congestion which was stable compared to prior exam. POC TnI neg. BUN/Cr at baseline; otherwise BMET and CBC WNL.  Inpatient Medications:     . adenosine (ADENOCARD) IV  12 mg Intravenous Once  . diltiazem (CARDIZEM) infusion  5-15 mg/hr Intravenous Once  . sodium chloride  500 mL Intravenous Once  . DISCONTD: diltiazem  10 mg Intravenous Once    (Not in a hospital admission)  Family History  Problem Relation Age of Onset  . Cancer Brother     Colon cancer  . Aortic aneurysm Mother   . Colon polyps Neg Hx   . Liver disease Neg Hx     And no CRC     History   Social History  . Marital Status: Widowed    Spouse Name: N/A    Number of Children: 3  . Years of Education: N/A   Occupational History  . RETIRED    Social History Main Topics  . Smoking status: Never Smoker   . Smokeless tobacco: Never Used  . Alcohol Use: No  . Drug Use: No  . Sexually Active: No   Other Topics Concern  . Not on file   Social History Narrative   Gets regular exercise.     Review of  Systems: General: negative for chills, fever, night sweats or weight changes.  Cardiovascular: positive for chest pain, dyspnea on exertion, palpitations, dizziness, SOB, negative for edema, orthopnea, paroxysmal nocturnal dyspnea Dermatological: negative for rash Respiratory: positive for nonproductive cough, negative for wheezing Urologic: negative for hematuria Abdominal: negative for nausea, vomiting, diarrhea, bright red blood per rectum, melena, or hematemesis Neurologic: negative for visual changes, syncope All other systems reviewed and are otherwise negative except as noted above.  Physical Exam: Blood pressure 119/90, pulse 65, temperature 98.6 F (37 C), temperature source Oral, resp. rate 16, height 5\' 7"  (1.702 m), weight 73.483 kg (162 lb), SpO2 98.00%.   General: Elderly woman, NAD Head: Normocephalic, atraumatic, sclera non-icteric.  Neck: Negative for carotid bruits. JVD not elevated. Supple.  Lungs: Clear bilaterally  to auscultation without wheezes, rales, or rhonchi. Breathing is unlabored. Heart: RRR with clear S1 S2. Apical systolic II/VI murmur, no, rubs, or gallops appreciated. Abdomen: Soft, non-tender, non-distended with normoactive bowel sounds. No hepatomegaly. No rebound/guarding. No obvious abdominal masses. Msk:  Strength and tone appears normal for age. Extremities: No clubbing, cyanosis or edema.  Distal pedal pulses are 2+ and equal bilaterally. Neuro: Alert and oriented X 3. Moves all extremities spontaneously. Psych:  Responds to questions appropriately with a normal affect.  Labs: Recent Labs  [facility] 08/26/11 1533   WBC 10.5   HGB 13.3   HCT 40.0   MCV 89.9   PLT 285    Lab 08/26/11 1533  NA 136  K 3.9  CL 97  CO2 27  BUN 30*  CREATININE 1.17*  CALCIUM 9.6  PROT --  BILITOT --  ALKPHOS --  ALT --  AST --  AMYLASE --  LIPASE --  GLUCOSE 246*   No results found for this basename: HGBA1C in the last 72 hours Recent Labs  Basename  08/26/11 1533   CKTOTAL --   CKMB --   CKMBINDEX --   TROPONINI <0.30   Radiology/Studies: Dg Chest Port 1 View  08/26/2011  *RADIOLOGY REPORT*  Clinical Data: Shortness of breath.  History of atrial fibrillation.  PORTABLE CHEST - 1 VIEW  Comparison: Chest 08/13/2011.  Findings: Pacing device is again noted.  There is cardiomegaly and vascular congestion.  No consolidative process, pneumothorax or effusion is identified.  IMPRESSION: Cardiomegaly and vascular congestion.  Stable compared to prior exam.  Original Report Authenticated By: Bernadene Bell. D'ALESSIO, M.D.   Dg Chest Portable 1 View  08/13/2011  *RADIOLOGY REPORT*  Clinical Data: Chest pain.  Nonsmoker. Shortness of breath.  PORTABLE CHEST - 1 VIEW  Comparison: 10/20/2010.  Findings: Sequential pacemaker enters from the left with leads unchanged in position.  Mild cardiomegaly.  Central pulmonary vascular prominence.  No infiltrate, congestive heart failure or pneumothorax.  IMPRESSION: Chest central pulmonary vascular prominence.  Pacemaker in place with cardiomegaly.  Calcified aorta.  Original Report Authenticated By: Fuller Canada, M.D.    EKG: paced rhythm at 68 bpm, no ischemic changes    ASSESSMENT AND PLAN:  1. Atrial flutter with variable including 1:1 AV conduction. 2. HTN 3. Atrial fib 4. CAD 5. H/o amio lung 6. Coumadin refusal  Rec: she has returned to NSR. I have recommended a trial of sotalol 120 mg twice daily. Will observe for 3 days on telemetry. Follow electrolytes.    Signed, Lewayne Bunting, M.D. 08/26/2011, 5:22 PM

## 2011-08-26 NOTE — ED Notes (Signed)
Sugar checked before patient had dinner

## 2011-08-27 ENCOUNTER — Other Ambulatory Visit: Payer: Self-pay

## 2011-08-27 DIAGNOSIS — I4892 Unspecified atrial flutter: Principal | ICD-10-CM

## 2011-08-27 LAB — CBC
HCT: 36 % (ref 36.0–46.0)
Hemoglobin: 11.9 g/dL — ABNORMAL LOW (ref 12.0–15.0)
MCHC: 33.1 g/dL (ref 30.0–36.0)
RBC: 3.98 MIL/uL (ref 3.87–5.11)
WBC: 11.2 10*3/uL — ABNORMAL HIGH (ref 4.0–10.5)

## 2011-08-27 LAB — BASIC METABOLIC PANEL
Chloride: 101 mEq/L (ref 96–112)
GFR calc Af Amer: 50 mL/min — ABNORMAL LOW (ref >90)
GFR calc non Af Amer: 43 mL/min — ABNORMAL LOW (ref >90)
Potassium: 4 mEq/L (ref 3.5–5.1)
Sodium: 138 mEq/L (ref 135–145)

## 2011-08-27 LAB — GLUCOSE, CAPILLARY
Glucose-Capillary: 255 mg/dL — ABNORMAL HIGH (ref 70–99)
Glucose-Capillary: 310 mg/dL — ABNORMAL HIGH (ref 70–99)
Glucose-Capillary: 343 mg/dL — ABNORMAL HIGH (ref 70–99)
Glucose-Capillary: 91 mg/dL (ref 70–99)

## 2011-08-27 LAB — MRSA PCR SCREENING: MRSA by PCR: POSITIVE — AB

## 2011-08-27 MED ORDER — DILTIAZEM HCL ER COATED BEADS 120 MG PO CP24
120.0000 mg | ORAL_CAPSULE | Freq: Every day | ORAL | Status: DC
  Administered 2011-08-27 – 2011-08-29 (×3): 120 mg via ORAL
  Filled 2011-08-27 (×5): qty 1

## 2011-08-27 MED ORDER — MUPIROCIN 2 % EX OINT
TOPICAL_OINTMENT | Freq: Two times a day (BID) | CUTANEOUS | Status: DC
  Administered 2011-08-27 – 2011-08-29 (×4): via NASAL
  Filled 2011-08-27: qty 22

## 2011-08-27 MED ORDER — BIOTENE DRY MOUTH MT LIQD
15.0000 mL | Freq: Two times a day (BID) | OROMUCOSAL | Status: DC
  Administered 2011-08-27 – 2011-08-29 (×4): 15 mL via OROMUCOSAL

## 2011-08-27 NOTE — Progress Notes (Signed)
Patient ID: Brittney [name], female   DOB: November 10, 1931, 77 y.o.   MRN: [id number]     SUBJECTIVE: Mrs Hamidi remains in NSR this am.  No chest pain or dyspnea.  ECG: NSR, QTc < 500 msec  Problem List:  Past Medical History   Diagnosis  Date   .  Hypertension    .  Dyslipidemia    .  CAD (coronary artery disease)      Non-STEMI August, 2010,,,, 40% left main, 30% LAD, 60% OM 2, 40% RCA, 90% OM1-culprit lesion-too small for intervention   .  Ejection fraction      EF 55% ,2011 / EF 30% with tachycardia cardiomyopathy / EF 50% January, 2012   .  Diabetes mellitus    .  Dementia      ?? Early dementia ?? 2012   .  GERD (gastroesophageal reflux disease)    .  TIA (transient ischemic attack)      History of TIAs   .  Hypothyroidism    .  PAD (peripheral artery disease)      Carotid endarterectomy, right. Stents right peroneal, right superficial, right popliteal arteries, Dr. Edilia Bo   .  Amputation      Right midfoot 2010   .  CKD (chronic kidney disease)      Creatinine 1.19 at December 2012 discharge   .  Gait disorder    .  Vertebrobasilar insufficiency    .  Depression    .  Hyperkalemia      ACE Inhibitor held   .  Acute renal insufficiency      Catheterization August 2010   .  Blindness of right eye    .  Respiratory failure      Hypoxic and hypercapnic, 2011, etiology pneumonia   .  Leukocytosis  5/11     Persistent post pneumonia   .  Orthostatic hypotension      Syncope January 2012 with facial trauma, amiodarone stopped   .  Tachycardia induced cardiomyopathy      Previous EF 30%. Most recently 55-60% by echo 08/14/11   .  Atrial fibrillation  8/10     Coumadin use in the past. Patient decided August, 2012,That she did not want Coumadin restart / Rapid rate; Coumadin started TE vardioversion done 04/30/09; amiodarone, beta blockers during hospitalization 8/10; beta blockers lower blood pressure excessively without heart rate control   .  Ventricular tachycardia      20  beats December, 2012, asymptomatic   .  Atrial flutter  05/24/09     Hospitalization in [place]; with difficult rate control; TEE cardioversion done EF 30%, secondary to tachycardia, pt did convert back tosinus rhythm; return 10/11, amiodarone restarted 10/11, no longer on it. Recurrence with RVR 08/2011.   .  Bradycardia, severe sinus  05/31/09     [facility]; cardioversion, chest compressions, pacemaker placed 05/2009 [facility])   .  Anemia in CKD (chronic kidney disease)    .  Gastroparesis  3/11     Abnormal GES       Filed Vitals:   08/26/11 1644 08/26/11 1834 08/26/11 2147 08/27/11 0441  BP: 119/90 133/51 128/60 117/70  Pulse: 65 66 63 66  Temp:   97.8 F (36.6 C) 98.7 F (37.1 C)  TempSrc:   Oral Oral  Resp: 16 14 16 16   Height:   5\' 7"  (1.702 m) 5\' 7"  (1.702 m)  Weight:   71.4 kg (157 lb 6.5 oz)  71.3 kg (157 lb 3 oz)  SpO2: 98% 100% 97% 98%   No intake or output data in the 24 hours ending 08/27/11 0806  LABS: Basic Metabolic Panel:  Basename 08/27/11 0615 08/26/11 1533  NA 138 136  K 4.0 3.9  CL 101 97  CO2 30 27  GLUCOSE 269* 246*  BUN 32* 30*  CREATININE 1.18* 1.17*  CALCIUM 9.2 9.6  MG -- --  PHOS -- --   Liver Function Tests: No results found for this basename: AST:2,ALT:2,ALKPHOS:2,BILITOT:2,PROT:2,ALBUMIN:2 in the last 72 hours No results found for this basename: LIPASE:2,AMYLASE:2 in the last 72 hours CBC:  Basename 08/27/11 0117 08/26/11 1533  WBC 11.2* 10.5  NEUTROABS -- --  HGB 11.9* 13.3  HCT 36.0 40.0  MCV 90.5 89.9  PLT 264 285   Cardiac Enzymes:  Basename 08/26/11 1533  CKTOTAL --  CKMB --  CKMBINDEX --  TROPONINI <0.30      . adenosine (ADENOCARD) IV  12 mg Intravenous Once  . antiseptic oral rinse  15 mL Mouth Rinse BID  . aspirin EC  81 mg Oral Daily  . diltiazem  120 mg Oral Daily  . diltiazem (CARDIZEM) infusion  5-15 mg/hr Intravenous Once  . enoxaparin  40 mg Subcutaneous Daily  . furosemide  40 mg Oral Daily   . insulin aspart  0-15 Units Subcutaneous TID WC  . insulin glargine  15 Units Subcutaneous QHS  . levothyroxine  75 mcg Oral Q breakfast  . linagliptin  5 mg Oral Daily  . midodrine  5 mg Oral BID WC  . rosuvastatin  20 mg Oral Daily  . sertraline  100 mg Oral Daily  . sodium chloride  500 mL Intravenous Once  . sodium chloride  3 mL Intravenous Q12H  . sotalol  120 mg Oral Q12H  . Travoprost (BAK Free)  1 drop Both Eyes QHS  . DISCONTD: diltiazem  120 mg Oral Daily  . DISCONTD: diltiazem  10 mg Intravenous Once  . DISCONTD: insulin aspart  5-9 Units Subcutaneous 5 X Daily   RADIOLOGY: Dg Chest Port 1 View  08/26/2011  *RADIOLOGY REPORT*  Clinical Data: Shortness of breath.  History of atrial fibrillation.  PORTABLE CHEST - 1 VIEW  Comparison: Chest 08/13/2011.  Findings: Pacing device is again noted.  There is cardiomegaly and vascular congestion.  No consolidative process, pneumothorax or effusion is identified.  IMPRESSION: Cardiomegaly and vascular congestion.  Stable compared to prior exam.  Original Report Authenticated By: Bernadene Bell. Maricela Curet, M.D.    PHYSICAL EXAM General: NAD Neck: No JVD, no thyromegaly or thyroid nodule.  Lungs: Clear to auscultation bilaterally with normal respiratory effort. CV: Nondisplaced PMI.  Heart regular S1/S2, no S3/S4, no murmur.  No peripheral edema.  No carotid bruit.  Normal pedal pulses.  Abdomen: Soft, nontender, no hepatosplenomegaly, no distention.  Neurologic: Alert and oriented x 3.  Psych: Normal affect. Extremities: No clubbing or cyanosis.   ASSESSMENT AND PLAN:  77 yo with h/o CAD, PAD, h/o atrial fibrillation, and h/o tachycardia-mediated cardiomyopathy admitted with rapid atrial flutter, now in NSR on sotalol.  1. Atrial flutter: NSR on sotalol.  QTc is < 500 this am.  She has refused anticoagulation. Continue ASA, sotalol, and diltiazem.  Daily ECG, follow K/Mg.  Needs to be observed on sotalol for 3 days.  2. CAD: Stable,  no chest pain.  Troponin negative in setting of atrial flutter with RVR.  3. H/o tachy-mediated CMP: Echo recently showed normal EF.  Marca Ancona 08/27/2011 8:10 AM

## 2011-08-28 ENCOUNTER — Other Ambulatory Visit: Payer: Self-pay

## 2011-08-28 DIAGNOSIS — I4891 Unspecified atrial fibrillation: Secondary | ICD-10-CM

## 2011-08-28 LAB — BASIC METABOLIC PANEL
BUN: 32 mg/dL — ABNORMAL HIGH (ref 6–23)
BUN: 30 mg/dL — ABNORMAL HIGH (ref 6–23)
Calcium: 9.7 mg/dL (ref 8.4–10.5)
Calcium: 9.5 mg/dL (ref 8.4–10.5)
Chloride: 99 mEq/L (ref 96–112)
Creatinine, Ser: 1.23 mg/dL — ABNORMAL HIGH (ref 0.50–1.10)
Creatinine, Ser: 1.24 mg/dL — ABNORMAL HIGH (ref 0.50–1.10)
GFR calc Af Amer: 48 mL/min — ABNORMAL LOW (ref >90)
GFR calc Af Amer: 47 mL/min — ABNORMAL LOW (ref >90)

## 2011-08-28 LAB — GLUCOSE, CAPILLARY
Glucose-Capillary: 259 mg/dL — ABNORMAL HIGH (ref 70–99)
Glucose-Capillary: 365 mg/dL — ABNORMAL HIGH (ref 70–99)
Glucose-Capillary: 138 mg/dL — ABNORMAL HIGH (ref 70–99)

## 2011-08-28 MED ORDER — MAGNESIUM OXIDE 400 MG PO TABS
400.0000 mg | ORAL_TABLET | Freq: Every day | ORAL | Status: DC
  Administered 2011-08-28 – 2011-08-29 (×2): 400 mg via ORAL
  Filled 2011-08-28 (×2): qty 1

## 2011-08-28 MED ORDER — INSULIN GLARGINE 100 UNIT/ML ~~LOC~~ SOLN
20.0000 [IU] | Freq: Every day | SUBCUTANEOUS | Status: DC
  Administered 2011-08-28: 20 [IU] via SUBCUTANEOUS
  Filled 2011-08-28: qty 3

## 2011-08-28 MED ORDER — MAGNESIUM SULFATE 50 % IJ SOLN
2.0000 g | Freq: Once | INTRAVENOUS | Status: AC
  Administered 2011-08-28: 2 g via INTRAVENOUS
  Filled 2011-08-28: qty 4

## 2011-08-28 MED ORDER — SOTALOL HCL 80 MG PO TABS
80.0000 mg | ORAL_TABLET | Freq: Every day | ORAL | Status: DC
  Administered 2011-08-28 – 2011-08-29 (×2): 80 mg via ORAL
  Filled 2011-08-28 (×2): qty 1

## 2011-08-28 MED ORDER — MAGNESIUM SULFATE 50 % IJ SOLN: 1.0000 g | Freq: Once | INTRAMUSCULAR | Status: DC

## 2011-08-28 MED ORDER — SOTALOL HCL 80 MG PO TABS: 80.0000 mg | ORAL_TABLET | Freq: Two times a day (BID) | ORAL | Status: DC

## 2011-08-28 NOTE — Progress Notes (Signed)
RN notified MD on call for [place] concerning pt's blood sugar of 422. Per PA, Dayna Dunn, stat blood glucose draw, if blood glucose > 400 give insulin according to cbg protocol.

## 2011-08-28 NOTE — Progress Notes (Signed)
Inpatient Diabetes Program Recommendations  AACE/ADA: New Consensus Statement on Inpatient Glycemic Control (2009)  Target Ranges:  Prepandial:   less than 140 mg/dL      Peak postprandial:   less than 180 mg/dL (1-2 hours)      Critically ill patients:  140 - 180 mg/dL   Reason for Visit: CBGs 08/27/11  [phone]    08/28/11  259  Inpatient Diabetes Program Recommendations Insulin - Basal: Increase Lantus to 20 units daily if CBGs continue greater than 180 mg/dl [id number]: Check [id number] to assess home glucose control  Note:

## 2011-08-28 NOTE — Progress Notes (Signed)
Notified PA on call of QTc 501 since the patient is on sotalol. Next dose is tomorrow at 10am. MD can decide to make changes or not in the morning based on current orders.  Current orders are EKG in am, MG in am, and BMP in am. Will continue to monitor.

## 2011-08-28 NOTE — Progress Notes (Signed)
Patient ID: Brittney [name], female   DOB: July 29, 1932, 77 y.o.   MRN: [id number]      SUBJECTIVE: Brittney [name] remains in NSR this am.  No chest pain or dyspnea.  ECG: NSR, QTc 506 msec  Problem List:  Past Medical History   Diagnosis  Date   .  Hypertension    .  Dyslipidemia    .  CAD (coronary artery disease)      Non-STEMI August, 2010,,,, 40% left main, 30% LAD, 60% OM 2, 40% RCA, 90% OM1-culprit lesion-too small for intervention   .  Ejection fraction      EF 55% ,2011 / EF 30% with tachycardia cardiomyopathy / EF 50% January, 2012   .  Diabetes mellitus    .  Dementia      ?? Early dementia ?? 2012   .  GERD (gastroesophageal reflux disease)    .  TIA (transient ischemic attack)      History of TIAs   .  Hypothyroidism    .  PAD (peripheral artery disease)      Carotid endarterectomy, right. Stents right peroneal, right superficial, right popliteal arteries, Dr. Edilia [name]   .  Amputation      Right midfoot 2010   .  CKD (chronic kidney disease)      Creatinine 1.19 at December 2012 discharge   .  Gait disorder    .  Vertebrobasilar insufficiency    .  Depression    .  Hyperkalemia      ACE Inhibitor held   .  Acute renal insufficiency      Catheterization August 2010   .  Blindness of right eye    .  Respiratory failure      Hypoxic and hypercapnic, 2011, etiology pneumonia   .  Leukocytosis  5/11     Persistent post pneumonia   .  Orthostatic hypotension      Syncope January 2012 with facial trauma, amiodarone stopped   .  Tachycardia induced cardiomyopathy      Previous EF 30%. Most recently 55-60% by echo 08/14/11   .  Atrial fibrillation  8/10     Coumadin use in the past. Patient decided August, 2012,That she did not want Coumadin restart / Rapid rate; Coumadin started TE vardioversion done 04/30/09; amiodarone, beta blockers during hospitalization 8/10; beta blockers lower blood pressure excessively without heart rate control   .  Ventricular tachycardia      20  beats December, 2012, asymptomatic   .  Atrial flutter  05/24/09     Hospitalization in [place]; with difficult rate control; TEE cardioversion done EF 30%, secondary to tachycardia, pt did convert back tosinus rhythm; return 10/11, amiodarone restarted 10/11, no longer on it. Recurrence with RVR 08/2011.   .  Bradycardia, severe sinus  05/31/09     Brittney [name]; cardioversion, chest compressions, pacemaker placed 05/2009 Brittney [name])   .  Anemia in CKD (chronic kidney disease)    .  Gastroparesis  3/11     Abnormal GES       Filed Vitals:   08/28/11 0500 08/28/11 0506 08/28/11 0734 08/28/11 0804  BP:  120/52  120/52  Pulse:  62    Temp:  98 F (36.7 C)    TempSrc: Oral Oral    Resp:  18    Height:      Weight:   70.217 kg (154 lb 12.8 oz)   SpO2:  91%  Intake/Output Summary (Last 24 hours) at 08/28/11 0819 Last data filed at 08/27/11 1700  Gross per 24 hour  Intake    580 ml  Output      0 ml  Net    580 ml    LABS: Basic Metabolic Panel:  Basename 08/28/11 0525 08/27/11 0615  NA 138 138  K 4.3 4.0  CL 99 101  CO2 31 30  GLUCOSE 280* 269*  BUN 32* 32*  CREATININE 1.23* 1.18*  CALCIUM 9.7 9.2  MG 1.8 --  PHOS -- --   Liver Function Tests: No results found for this basename: AST:2,ALT:2,ALKPHOS:2,BILITOT:2,PROT:2,ALBUMIN:2 in the last 72 hours No results found for this basename: LIPASE:2,AMYLASE:2 in the last 72 hours CBC:  Basename 08/27/11 0117 08/26/11 1533  WBC 11.2* 10.5  NEUTROABS -- --  HGB 11.9* 13.3  HCT 36.0 40.0  MCV 90.5 89.9  PLT 264 285   Cardiac Enzymes:  Basename 08/26/11 1533  CKTOTAL --  CKMB --  CKMBINDEX --  TROPONINI <0.30      . antiseptic oral rinse  15 mL Mouth Rinse BID  . aspirin EC  81 mg Oral Daily  . diltiazem  120 mg Oral Daily  . enoxaparin  40 mg Subcutaneous Daily  . furosemide  40 mg Oral Daily  . insulin aspart  0-15 Units Subcutaneous TID WC  . insulin glargine  15 Units Subcutaneous QHS  .  levothyroxine  75 mcg Oral Q breakfast  . linagliptin  5 mg Oral Daily  . magnesium oxide  400 mg Oral Daily  . midodrine  5 mg Oral BID WC  . mupirocin ointment   Nasal BID  . rosuvastatin  20 mg Oral Daily  . sertraline  100 mg Oral Daily  . sodium chloride  3 mL Intravenous Q12H  . sotalol  80 mg Oral Daily  . Travoprost (BAK Free)  1 drop Both Eyes QHS  . DISCONTD: sotalol  120 mg Oral Q12H  . DISCONTD: sotalol  80 mg Oral Q12H   RADIOLOGY: Dg Chest Port 1 View  08/26/2011  *RADIOLOGY REPORT*  Clinical Data: Shortness of breath.  History of atrial fibrillation.  PORTABLE CHEST - 1 VIEW  Comparison: Chest 08/13/2011.  Findings: Pacing device is again noted.  There is cardiomegaly and vascular congestion.  No consolidative process, pneumothorax or effusion is identified.  IMPRESSION: Cardiomegaly and vascular congestion.  Stable compared to prior exam.  Original Report Authenticated By: Brittney [name]. Brittney [name], M.D.    PHYSICAL EXAM General: NAD Neck: No JVD, no thyromegaly or thyroid nodule.  Lungs: Clear to auscultation bilaterally with normal respiratory effort. CV: Nondisplaced PMI.  Heart regular S1/S2, no S3/S4, no murmur.  No peripheral edema.  No carotid bruit.  Normal pedal pulses.  Abdomen: Soft, nontender, no hepatosplenomegaly, no distention.  Neurologic: Alert and oriented x 3.  Psych: Normal affect. Extremities: No clubbing or cyanosis.   ASSESSMENT AND PLAN:  77 yo with h/o CAD, PAD, h/o atrial fibrillation, and h/o tachycardia-mediated cardiomyopathy admitted with rapid atrial flutter, now in NSR on sotalol.  1. Atrial flutter: NSR on sotalol.  QTc is prolonged and GFR is 48.  I am going to dose sotalol once a day for renal function and lower to 80 mg.  Will repeat ECG this pm and again in the morning.  She has refused anticoagulation. Continue ASA and diltiazem.  Daily ECG, follow K/Mg.  Needs to be observed on sotalol for 3 days.  2. CAD: Stable, no  chest pain.   Troponin negative in setting of atrial flutter with RVR.  3. H/o tachy-mediated CMP: Echo recently showed normal EF.   Brittney [name] 08/28/2011 8:19 AM

## 2011-08-29 ENCOUNTER — Encounter (HOSPITAL_COMMUNITY): Payer: Self-pay | Admitting: Cardiology

## 2011-08-29 ENCOUNTER — Other Ambulatory Visit: Payer: Self-pay

## 2011-08-29 DIAGNOSIS — I498 Other specified cardiac arrhythmias: Secondary | ICD-10-CM

## 2011-08-29 DIAGNOSIS — Z79899 Other long term (current) drug therapy: Secondary | ICD-10-CM | POA: Diagnosis not present

## 2011-08-29 LAB — BASIC METABOLIC PANEL
BUN: 28 mg/dL — ABNORMAL HIGH (ref 6–23)
CO2: 31 mEq/L (ref 19–32)
Calcium: 10 mg/dL (ref 8.4–10.5)
GFR calc non Af Amer: 43 mL/min — ABNORMAL LOW (ref >90)
Glucose, Bld: 122 mg/dL — ABNORMAL HIGH (ref 70–99)
Potassium: 4.1 mEq/L (ref 3.5–5.1)

## 2011-08-29 LAB — GLUCOSE, CAPILLARY: Glucose-Capillary: 145 mg/dL — ABNORMAL HIGH (ref 70–99)

## 2011-08-29 MED ORDER — SOTALOL HCL 80 MG PO TABS: 80.0000 mg | ORAL_TABLET | Freq: Two times a day (BID) | ORAL | Status: DC

## 2011-08-29 MED ORDER — ROSUVASTATIN CALCIUM 20 MG PO TABS: 20.0000 mg | ORAL_TABLET | Freq: Every day | ORAL | Status: DC

## 2011-08-29 MED ORDER — CHLORHEXIDINE GLUCONATE CLOTH 2 % EX PADS: 6.0000 | MEDICATED_PAD | Freq: Every day | CUTANEOUS | Status: DC

## 2011-08-29 MED ORDER — SOTALOL HCL 80 MG PO TABS: 80.0000 mg | ORAL_TABLET | Freq: Every day | ORAL | Status: DC

## 2011-08-29 NOTE — Discharge Summary (Signed)
Discharge Summary   Patient ID: Brittney [name],  MRN: [id number], DOB/AGE: 12/30/1931 77 y.o.  Admit date: 08/26/2011 Discharge date: 08/29/2011  Discharge Diagnoses Principal Problem:  *Atrial flutter Active Problems:  Hypertension  Dyslipidemia  CAD (coronary artery disease)  Atrial fibrillation  Diabetes mellitus  CKD (chronic kidney disease)  Drug therapy,  Sotolol   Allergies Allergies  Allergen Reactions  . Morphine Sulfate     REACTION: jerking  . Oxycodone Hcl Itching and Other (See Comments)    Feels weird    Procedures  None  History of Present Illness  Brittney [name] is a 77 yo Caucasian female with PMHx significant for paroxysmal atrial flutter with RVR (on Cardizem outpatient), parox. a fib (not on coumadin, per patient's preferences) and PMHx significant for multiple cardiac arrhythmias including severe sinus bradycardia (s/p cardioversion, chest compressions and [facility] [facility] PM placed 09/10), tachycardia induced cardiomyopathy (EF improved from 40% to 55-60% on 08/14/11), VT (20 beat-run on previous admission 12/12) as well as CAD (NSTEMI 08/10- 40% left main, 30% LAD, 60% OM 2, 40% RCA, 90% OM1-culprit lesion-too small for intervention), HTN, HL, type 2 DM who presented to [facility] ED 12/18 with palpitations, chest pain and dyspnea.    Hospital Course   At [facility] ED, she was subsequently found to have atrial flutter with variable AV conduction. She was treated with IV adenosine with high grade block and spontaneous return to NSR with minimal palpitations. No evidence of ischemia by EKG or CEs. She was admitted for monitoring x 3 days for sotalol initiation. She maintained NSR during this time and was asymptomatic. QT did become prolonged on day 2, and due to GFR of 48, her sotalol was decreased from 120 mg to 80 mg daily. Magnesium was found to be mildly decreased with successful supplementation.   She otherwise tolerated sotalol well with NSR maintenance. She is stable, in  good condition today and will be discharged on this adjusted dose of sotalol secondare age and renal function. Of note, patient not on statin outpatient, last lipid panel revealed HL, no evidence of statin intolerance. She will be discharged on Crestor.    Discharge Vitals:  Blood pressure 133/60, pulse 64, temperature 97.8 F (36.6 C), temperature source Oral, resp. rate 18, height 5\' 7"  (1.702 m), weight 69.355 kg (152 lb 14.4 oz), SpO2 93.00%.   Weight change:   Labs: Recent Labs  [facility] 08/27/11 0117 08/26/11 1533   WBC 11.2* 10.5   HGB 11.9* 13.3   HCT 36.0 40.0   MCV 90.5 89.9   PLT 264 285    Lab 08/29/11 0455 08/28/11 1553 08/28/11 0525  NA 140 133* 138  K 4.1 4.1 4.3  CL 100 94* 99  CO2 31 31 31   BUN 28* 30* 32*  CREATININE 1.18* 1.24* 1.23*  CALCIUM 10.0 9.5 9.7  PROT -- -- --  BILITOT -- -- --  ALKPHOS -- -- --  ALT -- -- --  AST -- -- --  AMYLASE -- -- --  LIPASE -- -- --  GLUCOSE 122* 404* 280*    Recent Labs  Basename 08/26/11 1533   CKTOTAL --   CKMB --   CKMBINDEX --   TROPONINI <0.30   Disposition:  Discharge Orders    Future Appointments: Provider: Department: Dept Phone: Center:   09/04/2011 1:15 PM [name], MD [facility] [phone] [facility]     Follow-up Information    Follow up with Willa Rough, MD on 09/04/2011. (At  1:15 PM. )    Contact information:   [address] [place] [address] [phone]          Discharge Medications:  Current Discharge Medication List    START taking these medications   Details  rosuvastatin (CRESTOR) 20 MG tablet Take 1 tablet (20 mg total) by mouth daily. Qty: 30 tablet, Refills: 3    sotalol (BETAPACE) 80 MG tablet Take 1 tablet (80 mg total) by mouth 2 (two) times daily. Qty: 30 tablet, Refills: 3      CONTINUE these medications which have NOT CHANGED   Details  acetaminophen (TYLENOL) 500 MG tablet Take 500 mg  by mouth every 6 (six) hours as needed. For pain    Ascorbic Acid (VITAMIN C) 1000 MG tablet Take 1,000 mg by mouth daily.      aspirin EC 325 MG EC tablet Take 1 tablet (325 mg total) by mouth daily.    diltiazem (CARDIZEM CD) 120 MG 24 hr capsule Take 1 capsule (120 mg total) by mouth daily. Qty: 30 capsule, Refills: 6    furosemide (LASIX) 40 MG tablet Take 1 tablet (40 mg total) by mouth daily. Qty: 30 tablet, Refills: 6    insulin aspart (NOVOLOG) 100 UNIT/ML injection Inject 5-9 Units into the skin 5 (five) times daily. Per sliding scale.  90-200: 5 units, 201-250: 6 units, 251-300: 7 units, 301-350: 8 units, 351-400: 9 units    insulin glargine (LANTUS) 100 UNIT/ML injection Inject 15 Units into the skin at bedtime.      levothyroxine (SYNTHROID, LEVOTHROID) 75 MCG tablet Take 75 mcg by mouth daily.      linagliptin (TRADJENTA) 5 MG TABS tablet Take 5 mg by mouth daily.      midodrine (PROAMATINE) 5 MG tablet Take 5 mg by mouth 2 (two) times daily.      nitroGLYCERIN (NITROSTAT) 0.4 MG SL tablet Place 0.4 mg under the tongue every 5 (five) minutes as needed. For chest pain    pyridOXINE (VITAMIN B-6) 100 MG tablet Take 100 mg by mouth daily.      sertraline (ZOLOFT) 100 MG tablet Take 100 mg by mouth daily.      travoprost, benzalkonium, (TRAVATAN) 0.004 % ophthalmic solution Place 1 drop into both eyes at bedtime.          Outstanding Labs/Studies None pending  Duration of Discharge Encounter: 40 minutes including physician time.  Signed, R. Hurman Horn, PA-C 08/29/2011, 11:39 AM  Patient seen and examined. I agree with the assessment and plan as detailed above. See also my additional thoughts below.   Please refer to my rounding note from today also. The patient is discharged on 80 mg of sotalol. She is stable and I will see her back in the [place] office.  Willa Rough, MD, [facility] 09/01/2011 9:57 AM

## 2011-08-29 NOTE — Progress Notes (Signed)
Patient ID: Brittney [name], female   DOB: 1931/10/16, 77 y.o.   MRN: [id number] SUBJECTIVE:  The patient is feeling well today. She is tolerating sotalol.   Filed Vitals:   08/28/11 1406 08/28/11 1500 08/28/11 2247 08/29/11 0629  BP: 85/54 129/73 124/71 133/60  Pulse: 64  66 64  Temp: 98 F (36.7 C)  97.8 F (36.6 C) 97.8 F (36.6 C)  TempSrc: Oral  Oral Oral  Resp: 19  18 18   Height:      Weight:    152 lb 14.4 oz (69.355 kg)  SpO2: 95%  94% 93%    Intake/Output Summary (Last 24 hours) at 08/29/11 0907 Last data filed at 08/29/11 0841  Gross per 24 hour  Intake    483 ml  Output      0 ml  Net    483 ml    LABS: Basic Metabolic Panel:  Basename 08/29/11 0455 08/28/11 1553 08/28/11 0525  NA 140 133* --  K 4.1 4.1 --  CL 100 94* --  CO2 31 31 --  GLUCOSE 122* 404* --  BUN 28* 30* --  CREATININE 1.18* 1.24* --  CALCIUM 10.0 9.5 --  MG 2.2 -- 1.8  PHOS -- -- --   Liver Function Tests: No results found for this basename: AST:2,ALT:2,ALKPHOS:2,BILITOT:2,PROT:2,ALBUMIN:2 in the last 72 hours No results found for this basename: LIPASE:2,AMYLASE:2 in the last 72 hours CBC:  Basename 08/27/11 0117 08/26/11 1533  WBC 11.2* 10.5  NEUTROABS -- --  HGB 11.9* 13.3  HCT 36.0 40.0  MCV 90.5 89.9  PLT 264 285   Cardiac Enzymes:  Basename 08/26/11 1533  CKTOTAL --  CKMB --  CKMBINDEX --  TROPONINI <0.30   BNP: No components found with this basename: POCBNP:3 D-Dimer: No results found for this basename: DDIMER:2 in the last 72 hours Hemoglobin A1C: No results found for this basename: HGBA1C in the last 72 hours Fasting Lipid Panel: No results found for this basename: CHOL,HDL,LDLCALC,TRIG,CHOLHDL,LDLDIRECT in the last 72 hours Thyroid Function Tests: No results found for this basename: TSH,T4TOTAL,FREET3,T3FREE,THYROIDAB in the last 72 hours  RADIOLOGY: Dg Chest Port 1 View  08/26/2011  *RADIOLOGY REPORT*  Clinical Data: Shortness of breath.  History of atrial  fibrillation.  PORTABLE CHEST - 1 VIEW  Comparison: Chest 08/13/2011.  Findings: Pacing device is again noted.  There is cardiomegaly and vascular congestion.  No consolidative process, pneumothorax or effusion is identified.  IMPRESSION: Cardiomegaly and vascular congestion.  Stable compared to prior exam.  Original Report Authenticated By: Bernadene Bell. D'ALESSIO, M.D.   Dg Chest Portable 1 View  08/13/2011  *RADIOLOGY REPORT*  Clinical Data: Chest pain.  Nonsmoker. Shortness of breath.  PORTABLE CHEST - 1 VIEW  Comparison: 10/20/2010.  Findings: Sequential pacemaker enters from the left with leads unchanged in position.  Mild cardiomegaly.  Central pulmonary vascular prominence.  No infiltrate, congestive heart failure or pneumothorax.  IMPRESSION: Chest central pulmonary vascular prominence.  Pacemaker in place with cardiomegaly.  Calcified aorta.  Original Report Authenticated By: Fuller Canada, M.D.    PHYSICAL EXAM Patient is oriented to person time and place. Affect is normal. Lungs are clear. Respiratory effort is nonlabored. Cardiac exam reveals an S1 and S2. There no clicks or significant murmurs. There is no significant peripheral edema.   TELEMETRY: Telemetry is reviewed by me. She has sinus rhythm with atrial pacing  ASSESSMENT AND PLAN:  Principal Problem:   *Atrial flutter     She is holding sinus  rhythm with the addition of sotalol. Her current dose is 80 mg once daily. This is the dose chosen for her based on her age and renal function. She will go home on this. 12-lead EKG today is done and reveals a QT interval of 470 ms.  Active Problems:  Hypertension  Dyslipidemia  CAD (coronary artery disease)  Atrial fibrillation  Diabetes mellitus  CKD (chronic kidney disease)   Drug therapy,  Sotolol    Sotalol is a new medication for her. She will go home on 80 mg daily. She is stable for discharge and I can follow her back in the [place] office.   Willa Rough 08/29/2011 9:07  AM

## 2011-09-03 ENCOUNTER — Encounter: Payer: Self-pay | Admitting: Cardiology

## 2011-09-03 DIAGNOSIS — I4891 Unspecified atrial fibrillation: Secondary | ICD-10-CM | POA: Insufficient documentation

## 2011-09-04 ENCOUNTER — Encounter: Payer: Self-pay | Admitting: Cardiology

## 2011-09-04 ENCOUNTER — Ambulatory Visit (INDEPENDENT_AMBULATORY_CARE_PROVIDER_SITE_OTHER): Payer: Medicare Other | Admitting: Cardiology

## 2011-09-04 VITALS — BP 145/81 | HR 65 | Ht 66.0 in | Wt 164.0 lb

## 2011-09-04 DIAGNOSIS — I2510 Atherosclerotic heart disease of native coronary artery without angina pectoris: Secondary | ICD-10-CM

## 2011-09-04 DIAGNOSIS — I4891 Unspecified atrial fibrillation: Secondary | ICD-10-CM

## 2011-09-04 DIAGNOSIS — I4892 Unspecified atrial flutter: Secondary | ICD-10-CM

## 2011-09-04 DIAGNOSIS — Z79899 Other long term (current) drug therapy: Secondary | ICD-10-CM

## 2011-09-04 DIAGNOSIS — Z950 Presence of cardiac pacemaker: Secondary | ICD-10-CM

## 2011-09-04 DIAGNOSIS — Z7901 Long term (current) use of anticoagulants: Secondary | ICD-10-CM

## 2011-09-04 DIAGNOSIS — Z5189 Encounter for other specified aftercare: Secondary | ICD-10-CM

## 2011-09-04 MED ORDER — SOTALOL HCL 80 MG PO TABS: 80.0000 mg | ORAL_TABLET | Freq: Every day | ORAL | Status: DC

## 2011-09-04 NOTE — Assessment & Plan Note (Signed)
Patient has a pacemaker in place. She had marked bradycardia after converting to sinus in the past.

## 2011-09-04 NOTE — Assessment & Plan Note (Signed)
The patient recently had rapid atrial flutter. This was converted to sinus rhythm and low dose sotalol was started. She did not fill her prescription. However I feel strongly that it is most prudent to start it again as an outpatient. She has missed a few days. I feel that rehospitalization for sotalol in this setting is not warranted. She will be restarted on 80 mg once daily.

## 2011-09-04 NOTE — Progress Notes (Signed)
HPI Patient is seen post hospitalization. Brittney [name] has a very complex history. I had seen her last in the office in August, 2012. Brittney [name] was stable at that time. Brittney [name] has very symptomatic atrial fib and atrial flutter. Brittney [name] had been on amiodarone with good control. However in 2011 Brittney [name] had marked autonomic problems with syncope and amiodarone had to be stopped. Brittney [name] then returned to the hospital earlier this month with rapid atrial flutter. Brittney [name] converted to sinus and Brittney [name] was started on sotalol. Very careful attention was paid to the dosing. Eventually Brittney [name] was to go home on 80 mg of sotalol once daily. Despite making all plans for this the information was never completely passed to the pharmacy. Therefore the patient did not receive her sotalol. Brittney [name] has not had any palpitations.  As part of this evaluation I have reviewed the hospital records very carefully.  Allergies  Allergen Reactions  . Morphine Sulfate     REACTION: jerking  . Oxycodone Hcl Itching and Other (See Comments)    Feels weird    Current Outpatient Prescriptions  Medication Sig Dispense Refill  . acetaminophen (TYLENOL) 500 MG tablet Take 500 mg by mouth every 6 (six) hours as needed. For pain      . Ascorbic Acid (VITAMIN C) 1000 MG tablet Take 1,000 mg by mouth daily.        [name] aspirin EC 325 MG EC tablet Take 1 tablet (325 mg total) by mouth daily.      [name] diltiazem (CARDIZEM CD) 120 MG 24 hr capsule Take 1 capsule (120 mg total) by mouth daily.  30 capsule  6  . furosemide (LASIX) 40 MG tablet Take 1 tablet (40 mg total) by mouth daily.  30 tablet  6  . insulin aspart (NOVOLOG) 100 UNIT/ML injection Inject 5-9 Units into the skin 5 (five) times daily. Per sliding scale.  90-200: 5 units, 201-250: 6 units, 251-300: 7 units, 301-350: 8 units, 351-400: 9 units      . insulin glargine (LANTUS) 100 UNIT/ML injection Inject 15 Units into the skin at bedtime.        [name] levothyroxine (SYNTHROID, LEVOTHROID) 75 MCG tablet Take 75 mcg by mouth  daily.        [name] linagliptin (TRADJENTA) 5 MG TABS tablet Take 5 mg by mouth daily.        . midodrine (PROAMATINE) 5 MG tablet Take 5 mg by mouth 2 (two) times daily.        . nitroGLYCERIN (NITROSTAT) 0.4 MG SL tablet Place 0.4 mg under the tongue every 5 (five) minutes as needed. For chest pain      . pyridOXINE (VITAMIN B-6) 100 MG tablet Take 100 mg by mouth daily.        . sertraline (ZOLOFT) 100 MG tablet Take 100 mg by mouth daily.        . travoprost, benzalkonium, (TRAVATAN) 0.004 % ophthalmic solution Place 1 drop into both eyes at bedtime.        . rosuvastatin (CRESTOR) 20 MG tablet Take 1 tablet (20 mg total) by mouth daily.  30 tablet  3  . sotalol (BETAPACE) 80 MG tablet Take 1 tablet (80 mg total) by mouth daily.  30 tablet  3    History   Social History  . Marital Status: Widowed    Spouse Name: N/A    Number of Children: 3  . Years of Education: N/A   Occupational History  . RETIRED    Social  History Main Topics  . Smoking status: Never Smoker   . Smokeless tobacco: Never Used  . Alcohol Use: No  . Drug Use: No  . Sexually Active: No   Other Topics Concern  . Not on file   Social History Narrative   Gets regular exercise.    Family History  Problem Relation Age of Onset  . Cancer Brother     Colon cancer  . Aortic aneurysm Mother   . Colon polyps Neg Hx   . Liver disease Neg Hx     And no CRC    Past Medical History  Diagnosis Date  . Hypertension   . Dyslipidemia   . CAD (coronary artery disease)     Non-STEMI August, 2010,,,, 40% left main, 30% LAD, 60% OM 2, 40% RCA, 90% OM1-culprit lesion-too small for intervention           . Ejection fraction     EF 55% ,2011 / EF 30% with tachycardia cardiomyopathy / EF 50% January, 2012  . Diabetes mellitus   . Dementia     ?? Early dementia ?? 2012  . GERD (gastroesophageal reflux disease)   . TIA (transient ischemic attack)     History of TIAs  . Hypothyroidism   . PAD (peripheral artery  disease)     Carotid endarterectomy, right. Stents right peroneal, right superficial, right popliteal arteries, Dr. Edilia Bo  . Amputation     Right midfoot 2010  . CKD (chronic kidney disease)     Creatinine 1.19 at December 2012 discharge  . Gait disorder   . Vertebrobasilar insufficiency   . Depression   . Hyperkalemia     ACE Inhibitor held  . Acute renal insufficiency     Catheterization August 2010  . Blindness of right eye   . Respiratory failure     Hypoxic and hypercapnic, 2011, etiology pneumonia  . Leukocytosis 5/11    Persistent post pneumonia  . Orthostatic hypotension     Syncope January 2012 with facial trauma, amiodarone stopped  . Tachycardia induced cardiomyopathy     Previous EF 30%.  Most recently 55-60% by echo 08/14/11  . Atrial fibrillation 8/10    Coumadin use in the past.  Patient decided August, 2012,That Brittney [name] did not want Coumadin restart  /    Rapid rate; Coumadin started TE vardioversion done 04/30/09; amiodarone, beta blockers during hospitalization 8/10; beta blockers lower blood pressure excessively without heart rate control  . Ventricular tachycardia     20 beats December, 2012, asymptomatic  . Atrial flutter 05/24/09    Hospitalization in [place]; with difficult rate control; TEE cardioversion done EF 30%, secondary to tachycardia, pt did convert back tosinus rhythm; return 10/11, amiodarone restarted 10/11, no longer on it. Recurrence with RVR 08/2011.  . Bradycardia, severe sinus 05/31/09    [facility]; cardioversion, chest compressions, pacemaker placed 05/2009 [facility])  . Anemia in CKD (chronic kidney disease)   . Gastroparesis 3/11    Abnormal GES  . Drug therapy,  Sotolol     Sotolol started 08/2011.    Past Surgical History  Procedure Date  . Total abdominal hysterectomy   . Cholecystectomy   . Carotid endarterectomy   . Amputation 2010    RIGHT MIDFOOT  . Tonsillectomy   . Eye surgeries     Several right  . Pacemaker  insertion     SJM Pacemaker implant 9/10  . Pci stent to the right peroneal and right superficial as well  as right popliteal arteries     Dr. Edilia Bo    ROS   Patient denies fever, chills, headache, sweats, rash, change in vision, change in hearing, chest pain, cough, nausea vomiting, urinary symptoms. Brittney [name] has mild general fatigue. All of the systems are reviewed and are negative.  PHYSICAL EXAM  Patient is oriented to person time and place. Affect is normal. Brittney [name] is here with her daughter. There is no jugulovenous distention. Lungs are clear. Respiratory effort is nonlabored. Cardiac exam reveals S1 and S2. There no clicks or significant murmurs. The abdomen is soft. There is no peripheral edema. There no musculoskeletal deformities. There are no skin rashes.  Filed Vitals:   09/04/11 1310  BP: 145/81  Pulse: 65  Height: 5\' 6"  (1.676 m)  Weight: 164 lb (74.39 kg)    EKG EKG is done today and reviewed by me. Brittney [name] does have sinus rhythm.  ASSESSMENT & PLAN

## 2011-09-04 NOTE — Patient Instructions (Addendum)
Follow up as scheduled. Your physician recommends that you continue on your current medications as directed. Please refer to the Current Medication list given to you today. Notify the office regarding cholesterol medication. Get prescription filled for Sotalol and start today. Take 1 tablet by mouth daily.

## 2011-09-04 NOTE — Assessment & Plan Note (Signed)
She will restart her sotalol at 80 mg daily today.

## 2011-09-04 NOTE — Assessment & Plan Note (Signed)
Patient remains off Coumadin at this time. It had been stopped when she had very severe falling. After she is back on all of her medications but once again reconsider whether she is willing to restart

## 2011-09-04 NOTE — Assessment & Plan Note (Signed)
Coronary disease is stable. No change in therapy. 

## 2011-09-05 ENCOUNTER — Telehealth: Payer: Self-pay | Admitting: *Deleted

## 2011-09-05 NOTE — Telephone Encounter (Signed)
Pt's dgt left message on voicemail regarding cholesterol meds per Dr. Henrietta Hoover request at office visit yesterday. Pt's dgt states pt is currently not taking anything for her cholesterol. She has taken something in the past but is not at this time.   Attempted to reach pt's dgt by phone but no answer.

## 2011-09-23 ENCOUNTER — Other Ambulatory Visit: Payer: Self-pay

## 2011-09-23 ENCOUNTER — Encounter (HOSPITAL_COMMUNITY): Payer: Self-pay

## 2011-09-23 ENCOUNTER — Inpatient Hospital Stay (HOSPITAL_COMMUNITY)
Admission: EM | Admit: 2011-09-23 | Discharge: 2011-09-27 | DRG: 250 | Disposition: A | Discharge status: Home / Self Care | Payer: Medicare Other | Attending: Cardiovascular Disease | Admitting: Cardiovascular Disease

## 2011-09-23 ENCOUNTER — Emergency Department (HOSPITAL_COMMUNITY): Payer: Medicare Other

## 2011-09-23 DIAGNOSIS — I509 Heart failure, unspecified: Secondary | ICD-10-CM | POA: Diagnosis present

## 2011-09-23 DIAGNOSIS — I2510 Atherosclerotic heart disease of native coronary artery without angina pectoris: Secondary | ICD-10-CM | POA: Diagnosis present

## 2011-09-23 DIAGNOSIS — E875 Hyperkalemia: Secondary | ICD-10-CM | POA: Diagnosis present

## 2011-09-23 DIAGNOSIS — Z950 Presence of cardiac pacemaker: Secondary | ICD-10-CM

## 2011-09-23 DIAGNOSIS — I252 Old myocardial infarction: Secondary | ICD-10-CM

## 2011-09-23 DIAGNOSIS — I10 Essential (primary) hypertension: Secondary | ICD-10-CM | POA: Diagnosis present

## 2011-09-23 DIAGNOSIS — F0390 Unspecified dementia without behavioral disturbance: Secondary | ICD-10-CM | POA: Diagnosis present

## 2011-09-23 DIAGNOSIS — I4891 Unspecified atrial fibrillation: Principal | ICD-10-CM | POA: Diagnosis present

## 2011-09-23 DIAGNOSIS — E785 Hyperlipidemia, unspecified: Secondary | ICD-10-CM | POA: Diagnosis present

## 2011-09-23 DIAGNOSIS — I498 Other specified cardiac arrhythmias: Secondary | ICD-10-CM | POA: Diagnosis present

## 2011-09-23 DIAGNOSIS — E119 Type 2 diabetes mellitus without complications: Secondary | ICD-10-CM | POA: Diagnosis present

## 2011-09-23 DIAGNOSIS — I5033 Acute on chronic diastolic (congestive) heart failure: Secondary | ICD-10-CM | POA: Diagnosis present

## 2011-09-23 DIAGNOSIS — Z8673 Personal history of transient ischemic attack (TIA), and cerebral infarction without residual deficits: Secondary | ICD-10-CM

## 2011-09-23 DIAGNOSIS — I442 Atrioventricular block, complete: Secondary | ICD-10-CM

## 2011-09-23 DIAGNOSIS — N189 Chronic kidney disease, unspecified: Secondary | ICD-10-CM | POA: Diagnosis present

## 2011-09-23 DIAGNOSIS — D72829 Elevated white blood cell count, unspecified: Secondary | ICD-10-CM | POA: Diagnosis present

## 2011-09-23 DIAGNOSIS — E039 Hypothyroidism, unspecified: Secondary | ICD-10-CM | POA: Diagnosis present

## 2011-09-23 DIAGNOSIS — R001 Bradycardia, unspecified: Secondary | ICD-10-CM

## 2011-09-23 DIAGNOSIS — K219 Gastro-esophageal reflux disease without esophagitis: Secondary | ICD-10-CM | POA: Diagnosis present

## 2011-09-23 DIAGNOSIS — Z9861 Coronary angioplasty status: Secondary | ICD-10-CM

## 2011-09-23 DIAGNOSIS — I129 Hypertensive chronic kidney disease with stage 1 through stage 4 chronic kidney disease, or unspecified chronic kidney disease: Secondary | ICD-10-CM | POA: Diagnosis present

## 2011-09-23 LAB — BASIC METABOLIC PANEL
Calcium: 10 mg/dL (ref 8.4–10.5)
GFR calc Af Amer: 57 mL/min — ABNORMAL LOW (ref >90)
GFR calc non Af Amer: 49 mL/min — ABNORMAL LOW (ref >90)
Glucose, Bld: 297 mg/dL — ABNORMAL HIGH (ref 70–99)
Potassium: 4.2 mEq/L (ref 3.5–5.1)
Sodium: 137 mEq/L (ref 135–145)

## 2011-09-23 LAB — GLUCOSE, CAPILLARY
Glucose-Capillary: 355 mg/dL — ABNORMAL HIGH (ref 70–99)
Glucose-Capillary: 241 mg/dL — ABNORMAL HIGH (ref 70–99)
Glucose-Capillary: 202 mg/dL — ABNORMAL HIGH (ref 70–99)

## 2011-09-23 LAB — DIFFERENTIAL
Basophils Absolute: 0 10*3/uL (ref 0.0–0.1)
Basophils Relative: 0 % (ref 0–1)
Eosinophils Absolute: 0.4 10*3/uL (ref 0.0–0.7)
Eosinophils Relative: 4 % (ref 0–5)
Lymphs Abs: 4.5 10*3/uL — ABNORMAL HIGH (ref 0.7–4.0)
Neutrophils Relative %: 48 % (ref 43–77)

## 2011-09-23 LAB — CBC
MCH: 30.4 pg (ref 26.0–34.0)
MCV: 91.8 fL (ref 78.0–100.0)
Platelets: 217 10*3/uL (ref 150–400)
RBC: 4.28 MIL/uL (ref 3.87–5.11)
RDW: 13.7 % (ref 11.5–15.5)

## 2011-09-23 LAB — TROPONIN I: Troponin I: <0.3 ng/mL (ref <0.30)

## 2011-09-23 LAB — URINE MICROSCOPIC-ADD ON

## 2011-09-23 LAB — URINALYSIS, ROUTINE W REFLEX MICROSCOPIC
Glucose, UA: 100 mg/dL — AB
Ketones, ur: NEGATIVE mg/dL
Leukocytes, UA: NEGATIVE
pH: 5.5 (ref 5.0–8.0)

## 2011-09-23 LAB — PRO B NATRIURETIC PEPTIDE: Pro B Natriuretic peptide (BNP): 3559 pg/mL — ABNORMAL HIGH (ref 0–450)

## 2011-09-23 LAB — POCT I-STAT TROPONIN I: Troponin i, poc: 0 ng/mL (ref 0.00–0.08)

## 2011-09-23 MED ORDER — INSULIN GLARGINE 100 UNIT/ML ~~LOC~~ SOLN
15.0000 [IU] | Freq: Every day | SUBCUTANEOUS | Status: DC
  Administered 2011-09-24 – 2011-09-25 (×2): 15 [IU] via SUBCUTANEOUS
  Filled 2011-09-23: qty 3

## 2011-09-23 MED ORDER — INSULIN ASPART 100 UNIT/ML ~~LOC~~ SOLN
0.0000 [IU] | Freq: Three times a day (TID) | SUBCUTANEOUS | Status: DC
  Administered 2011-09-24: 15 [IU] via SUBCUTANEOUS
  Administered 2011-09-24: 11 [IU] via SUBCUTANEOUS
  Administered 2011-09-24: 3 [IU] via SUBCUTANEOUS
  Administered 2011-09-25: 8 [IU] via SUBCUTANEOUS
  Administered 2011-09-25: 15 [IU] via SUBCUTANEOUS
  Administered 2011-09-25 – 2011-09-26 (×2): 3 [IU] via SUBCUTANEOUS
  Filled 2011-09-23: qty 3

## 2011-09-23 MED ORDER — NITROGLYCERIN IN D5W 200-5 MCG/ML-% IV SOLN: 3.0000 ug/min | INTRAVENOUS | Status: DC

## 2011-09-23 MED ORDER — INSULIN ASPART 100 UNIT/ML ~~LOC~~ SOLN
5.0000 [IU] | Freq: Every day | SUBCUTANEOUS | Status: DC
  Filled 2011-09-23: qty 3

## 2011-09-23 MED ORDER — FUROSEMIDE 10 MG/ML IJ SOLN
40.0000 mg | Freq: Two times a day (BID) | INTRAMUSCULAR | Status: DC
  Administered 2011-09-24 – 2011-09-25 (×4): 40 mg via INTRAVENOUS
  Filled 2011-09-23 (×8): qty 4

## 2011-09-23 MED ORDER — VITAMIN B-6 100 MG PO TABS
100.0000 mg | ORAL_TABLET | Freq: Every day | ORAL | Status: DC
  Administered 2011-09-24 – 2011-09-26 (×3): 100 mg via ORAL
  Filled 2011-09-23 (×3): qty 1

## 2011-09-23 MED ORDER — VITAMIN C 500 MG PO TABS
1000.0000 mg | ORAL_TABLET | Freq: Every day | ORAL | Status: DC
  Administered 2011-09-24 – 2011-09-26 (×3): 1000 mg via ORAL
  Filled 2011-09-23 (×3): qty 2

## 2011-09-23 MED ORDER — ASPIRIN EC 325 MG PO TBEC
325.0000 mg | DELAYED_RELEASE_TABLET | Freq: Every day | ORAL | Status: DC
  Administered 2011-09-24 – 2011-09-26 (×3): 325 mg via ORAL
  Filled 2011-09-23 (×4): qty 1

## 2011-09-23 MED ORDER — DILTIAZEM HCL 50 MG/10ML IV SOLN
5.0000 mg | Freq: Once | INTRAVENOUS | Status: AC
  Administered 2011-09-23: 5 mg via INTRAVENOUS
  Filled 2011-09-23: qty 10

## 2011-09-23 MED ORDER — LINAGLIPTIN 5 MG PO TABS
5.0000 mg | ORAL_TABLET | Freq: Every day | ORAL | Status: DC
  Administered 2011-09-24 – 2011-09-25 (×2): 5 mg via ORAL
  Filled 2011-09-23 (×3): qty 1

## 2011-09-23 MED ORDER — TRAVOPROST 0.004 % OP SOLN: 1.0000 [drp] | Freq: Every day | OPHTHALMIC | Status: DC

## 2011-09-23 MED ORDER — DILTIAZEM HCL 100 MG IV SOLR
5.0000 mg/h | INTRAVENOUS | Status: DC
  Administered 2011-09-23: 5 mg/h via INTRAVENOUS
  Administered 2011-09-24: 10 mg/h via INTRAVENOUS
  Administered 2011-09-24 (×2): 15 mg/h via INTRAVENOUS
  Administered 2011-09-25 – 2011-09-26 (×3): 10 mg/h via INTRAVENOUS

## 2011-09-23 MED ORDER — LEVOTHYROXINE SODIUM 75 MCG PO TABS
75.0000 ug | ORAL_TABLET | Freq: Every day | ORAL | Status: DC
  Administered 2011-09-24 – 2011-09-26 (×3): 75 ug via ORAL
  Filled 2011-09-23 (×3): qty 1

## 2011-09-23 MED ORDER — NITROGLYCERIN IN D5W 200-5 MCG/ML-% IV SOLN
10.0000 ug/min | INTRAVENOUS | Status: DC
  Administered 2011-09-23: 5 ug/min via INTRAVENOUS
  Filled 2011-09-23: qty 250

## 2011-09-23 MED ORDER — INSULIN ASPART 100 UNIT/ML ~~LOC~~ SOLN
6.0000 [IU] | Freq: Once | SUBCUTANEOUS | Status: AC
  Administered 2011-09-23: 6 [IU] via INTRAVENOUS
  Filled 2011-09-23: qty 3

## 2011-09-23 MED ORDER — SOTALOL HCL 80 MG PO TABS
80.0000 mg | ORAL_TABLET | Freq: Every day | ORAL | Status: DC
  Administered 2011-09-24 – 2011-09-26 (×3): 80 mg via ORAL
  Filled 2011-09-23 (×3): qty 1

## 2011-09-23 MED ORDER — ACETAMINOPHEN 500 MG PO TABS
500.0000 mg | ORAL_TABLET | Freq: Four times a day (QID) | ORAL | Status: DC | PRN
  Administered 2011-09-23 – 2011-09-25 (×3): 500 mg via ORAL
  Filled 2011-09-23 (×3): qty 1

## 2011-09-23 MED ORDER — DILTIAZEM LOAD VIA INFUSION
10.0000 mg | Freq: Once | INTRAVENOUS | Status: AC
  Administered 2011-09-23: 10 mg via INTRAVENOUS

## 2011-09-23 MED ORDER — ONDANSETRON HCL 4 MG/2ML IJ SOLN: 4.0000 mg | Freq: Four times a day (QID) | INTRAMUSCULAR | Status: DC | PRN

## 2011-09-23 MED ORDER — NITROGLYCERIN 0.4 MG SL SUBL
0.4000 mg | SUBLINGUAL_TABLET | SUBLINGUAL | Status: DC | PRN
Start: 2011-09-23 — End: 2011-09-26

## 2011-09-23 MED ORDER — SERTRALINE HCL 100 MG PO TABS
100.0000 mg | ORAL_TABLET | Freq: Every day | ORAL | Status: DC
  Administered 2011-09-24 – 2011-09-26 (×3): 100 mg via ORAL
  Filled 2011-09-23 (×3): qty 1

## 2011-09-23 MED ORDER — TRAVOPROST (BAK FREE) 0.004 % OP SOLN
1.0000 [drp] | Freq: Every day | OPHTHALMIC | Status: DC
  Administered 2011-09-24 – 2011-09-25 (×2): 1 [drp] via OPHTHALMIC
  Filled 2011-09-23: qty 2.5

## 2011-09-23 MED ORDER — DILTIAZEM HCL 50 MG/10ML IV SOLN
5.0000 mg | Freq: Once | INTRAVENOUS | Status: AC
  Administered 2011-09-23: 5 mg via INTRAVENOUS

## 2011-09-23 MED ORDER — FUROSEMIDE 10 MG/ML IJ SOLN
40.0000 mg | Freq: Once | INTRAMUSCULAR | Status: AC
  Administered 2011-09-23: 40 mg via INTRAVENOUS
  Filled 2011-09-23: qty 4

## 2011-09-23 MED ORDER — NITROGLYCERIN 0.4 MG SL SUBL
0.4000 mg | SUBLINGUAL_TABLET | Freq: Once | SUBLINGUAL | Status: AC
  Administered 2011-09-23: 0.4 mg via SUBLINGUAL
  Filled 2011-09-23: qty 25

## 2011-09-23 MED ORDER — MIDODRINE HCL 5 MG PO TABS
5.0000 mg | ORAL_TABLET | Freq: Two times a day (BID) | ORAL | Status: DC
  Administered 2011-09-24 – 2011-09-26 (×5): 5 mg via ORAL
  Filled 2011-09-23 (×7): qty 1

## 2011-09-23 MED ORDER — PNEUMOCOCCAL VAC POLYVALENT 25 MCG/0.5ML IJ INJ
0.5000 mL | INJECTION | INTRAMUSCULAR | Status: AC
  Filled 2011-09-23: qty 0.5

## 2011-09-23 NOTE — Progress Notes (Signed)
ANTICOAGULATION CONSULT NOTE - Initial Consult  Pharmacy Consult for Heparin Indication: atrial fibrillation  Allergies  Allergen Reactions  . Morphine Sulfate     REACTION: jerking  . Oxycodone Hcl Itching and Other (See Comments)    Feels weird    Patient Measurements: Height: 5\' 7"  (170.2 cm) Weight: 164 lb 0.4 oz (74.4 kg) IBW/kg (Calculated) : 61.6   Vital Signs: Temp: 99.5 F (37.5 C) (01/15 2245) Temp src: Oral (01/15 2245) BP: 142/82 mmHg (01/15 2245) Pulse Rate: 157  (01/15 2245)  Labs:  Basename 09/23/11 1200  HGB 13.0  HCT 39.3  PLT 217  APTT --  LABPROT --  INR --  HEPARINUNFRC --  CREATININE 1.07  CKTOTAL --  CKMB --  TROPONINI <0.30   Estimated Creatinine Clearance: 46.4 ml/min (by C-G formula based on Cr of 1.07).  Medical History: Past Medical History  Diagnosis Date  . Hypertension   . Dyslipidemia   . CAD (coronary artery disease)     Non-STEMI August, 2010,,,, 40% left main, 30% LAD, 60% OM 2, 40% RCA, 90% OM1-culprit lesion-too small for intervention           . Ejection fraction     EF 55% ,2011 / EF 30% with tachycardia cardiomyopathy / EF 50% January, 2012  . Diabetes mellitus   . Dementia     ?? Early dementia ?? 2012  . GERD (gastroesophageal reflux disease)   . TIA (transient ischemic attack)     History of TIAs  . Hypothyroidism   . PAD (peripheral artery disease)     Carotid endarterectomy, right. Stents right peroneal, right superficial, right popliteal arteries, Dr. Edilia Bo  . Amputation     Right midfoot 2010  . CKD (chronic kidney disease)     Creatinine 1.19 at December 2012 discharge  . Gait disorder   . Vertebrobasilar insufficiency   . Depression   . Hyperkalemia     ACE Inhibitor held  . Acute renal insufficiency     Catheterization August 2010  . Blindness of right eye   . Respiratory failure     Hypoxic and hypercapnic, 2011, etiology pneumonia  . Leukocytosis 5/11    Persistent post pneumonia  .  Orthostatic hypotension     Syncope January 2012 with facial trauma, amiodarone stopped  . Tachycardia induced cardiomyopathy     Previous EF 30%.  Most recently 55-60% by echo 08/14/11  . Atrial fibrillation 8/10    Coumadin use in the past.  Patient decided August, 2012,That she did not want Coumadin restart  /    Rapid rate; Coumadin started TE vardioversion done 04/30/09; amiodarone, beta blockers during hospitalization 8/10; beta blockers lower blood pressure excessively without heart rate control  . Ventricular tachycardia     20 beats December, 2012, asymptomatic  . Atrial flutter 05/24/09    Hospitalization in [place]; with difficult rate control; TEE cardioversion done EF 30%, secondary to tachycardia, pt did convert back tosinus rhythm; return 10/11, amiodarone restarted 10/11, no longer on it. Recurrence with RVR 08/2011.  . Bradycardia, severe sinus 05/31/09    [facility]; cardioversion, chest compressions, pacemaker placed 05/2009 [facility])  . Anemia in CKD (chronic kidney disease)   . Gastroparesis 3/11    Abnormal GES  . Drug therapy,  Sotolol     Sotolol started 08/2011.    Medications:  Prescriptions prior to admission  Medication Sig Dispense Refill  . acetaminophen (TYLENOL) 500 MG tablet Take 500 mg by mouth  every 6 (six) hours as needed. For pain      . Ascorbic Acid (VITAMIN C) 1000 MG tablet Take 1,000 mg by mouth daily.        [name] aspirin EC 325 MG EC tablet Take 1 tablet (325 mg total) by mouth daily.      [name] diltiazem (CARDIZEM CD) 120 MG 24 hr capsule Take 1 capsule (120 mg total) by mouth daily.  30 capsule  6  . furosemide (LASIX) 40 MG tablet Take 1 tablet (40 mg total) by mouth daily.  30 tablet  6  . insulin aspart (NOVOLOG) 100 UNIT/ML injection Inject 5-9 Units into the skin 5 (five) times daily. Per sliding scale.  90-200: 5 units, 201-250: 6 units, 251-300: 7 units, 301-350: 8 units, 351-400: 9 units      . insulin glargine (LANTUS) 100 UNIT/ML  injection Inject 15 Units into the skin at bedtime.        [name] levothyroxine (SYNTHROID, LEVOTHROID) 75 MCG tablet Take 75 mcg by mouth daily.        [name] linagliptin (TRADJENTA) 5 MG TABS tablet Take 5 mg by mouth daily.        . midodrine (PROAMATINE) 5 MG tablet Take 5 mg by mouth 2 (two) times daily.        [name] pyridOXINE (VITAMIN B-6) 100 MG tablet Take 100 mg by mouth daily.        . sertraline (ZOLOFT) 100 MG tablet Take 100 mg by mouth daily.        . sotalol (BETAPACE) 80 MG tablet Take 1 tablet (80 mg total) by mouth daily.  30 tablet  6  . travoprost, benzalkonium, (TRAVATAN) 0.004 % ophthalmic solution Place 1 drop into both eyes at bedtime.        . nitroGLYCERIN (NITROSTAT) 0.4 MG SL tablet Place 0.4 mg under the tongue every 5 (five) minutes as needed. For chest pain        Assessment: 78 yo female with Afib for Heparin  Goal of Therapy:  Heparin level 0.3-0.7 units/ml   Plan:  Heparin 3000 units IV bolus, then 1200 units/hr Check heparin level in 8 hours.  Lynora Dymond, Gary Fleet 09/23/2011,11:50 PM

## 2011-09-23 NOTE — H&P (Signed)
Admit date: 09/23/2011 Referring Physician  Dr. Rubin Payor Primary Cardiologist  Dr. Myrtis Ser Chief complaint/reason for admission:  Chest pain, afib with RVR and SOB  HPI: Brittney [name] is a 78 yo Caucasian female with PMHx significant for paroxysmal atrial flutter with RVR (on Cardizem outpatient), parox. a fib (not on coumadin, per patient's preferences) and PMHx significant for multiple cardiac arrhythmias including severe sinus bradycardia (s/p cardioversion, chest compressions and [employer] Jude PM placed 09/10), tachycardia induced cardiomyopathy (EF improved from 40% to 55-60% on 08/14/11), VT (20 beat-run on previous admission 12/12) as well as CAD (NSTEMI 08/10- 40% left main, 30% LAD, 60% OM 2, 40% RCA, 90% OM1-culprit lesion-too small for intervention), HTN, HL, type 2 DM who presented to [facility] ED 12/18 with palpitations, chest pain and dyspnea. she was subsequently found to have atrial flutter with variable AV conduction.  Apparently she had been on amiodarone with good control. However in 2011 she had marked autonomic problems with syncope and amiodarone had to be stopped.On 08/26/2011 she was treated with IV adenosine with high grade block and spontaneous return to NSR with minimal palpitations. No evidence of ischemia by EKG or CEs. She was admitted for monitoring x 3 days for sotalol initiation. She maintained NSR during this time and was asymptomatic. QT did become prolonged on day 2, and due to GFR of 48, her sotalol was decreased from 120 mg to 80 mg daily. Magnesium was found to be mildly decreased with successful supplementation. She otherwise tolerated sotalol well with NSR maintenance.   The patient was seen post hospitalization.  Apparently there were problems with information being passed on to the pharmacy and she never received her Sotolol.  She was restarted on Sotolol as an outpatient.  Patient has not been willing to start coumadin due to risks involved and her history of severe fall in past.   Today she presented to [facility] with complaints of acute onset of chest pain that awakened her from sleep.  She said it was midsternal with no radiation and 8/10 at its worst.  She was started on IV NTG gtt.  She was noted to be in afib with RVR which resolved spontaneously but then had an episode of bradycardia that lasted 2 minutes and was witnessed on them monitor by her nurse.  Apparently there was no pacer spikes at the time of the bradycardia.  The pacemaker was interrogated per ER notes but no bradycardia noted.  Her chest xray noted CHF and she was given IV Lasix.  She then had reocurence of her chest pain.  During transit from [facility] via [place] the patient went into afib with RVR.  On arrival in CCU she was in afib with RVR at 130bpm but denied any chest pain.  She was on an IV NTG gtt.  Apparently she had been given IV Cardizem 10mg  but no gtt was started.     PMH:    Past Medical History  Diagnosis Date  . Hypertension   . Dyslipidemia   . CAD (coronary artery disease)     Non-STEMI August, 2010,,,, 40% left main, 30% LAD, 60% OM 2, 40% RCA, 90% OM1-culprit lesion-too small for intervention           . Ejection fraction     EF 55% ,2011 / EF 30% with tachycardia cardiomyopathy / EF 50% January, 2012  . Diabetes mellitus   . Dementia     ?? Early dementia ?? 2012  . GERD (gastroesophageal reflux  disease)   . TIA (transient ischemic attack)     History of TIAs  . Hypothyroidism   . PAD (peripheral artery disease)     Carotid endarterectomy, right. Stents right peroneal, right superficial, right popliteal arteries, Dr. Edilia Bo  . Amputation     Right midfoot 2010  . CKD (chronic kidney disease)     Creatinine 1.19 at December 2012 discharge  . Gait disorder   . Vertebrobasilar insufficiency   . Depression   . Hyperkalemia     ACE Inhibitor held  . Acute renal insufficiency     Catheterization August 2010  . Blindness of right eye   . Respiratory failure      Hypoxic and hypercapnic, 2011, etiology pneumonia  . Leukocytosis 5/11    Persistent post pneumonia  . Orthostatic hypotension     Syncope January 2012 with facial trauma, amiodarone stopped  . Tachycardia induced cardiomyopathy     Previous EF 30%.  Most recently 55-60% by echo 08/14/11  . Atrial fibrillation 8/10    Coumadin use in the past.  Patient decided August, 2012,That she did not want Coumadin restart  /    Rapid rate; Coumadin started TE vardioversion done 04/30/09; amiodarone, beta blockers during hospitalization 8/10; beta blockers lower blood pressure excessively without heart rate control  . Ventricular tachycardia     20 beats December, 2012, asymptomatic  . Atrial flutter 05/24/09    Hospitalization in [place]; with difficult rate control; TEE cardioversion done EF 30%, secondary to tachycardia, pt did convert back tosinus rhythm; return 10/11, amiodarone restarted 10/11, no longer on it. Recurrence with RVR 08/2011.  . Bradycardia, severe sinus 05/31/09    [facility]; cardioversion, chest compressions, pacemaker placed 05/2009 [facility] Jude)  . Anemia in CKD (chronic kidney disease)   . Gastroparesis 3/11    Abnormal GES  . Drug therapy,  Sotolol     Sotolol started 08/2011.    PSH:    Past Surgical History  Procedure Date  . Total abdominal hysterectomy   . Cholecystectomy   . Carotid endarterectomy   . Amputation 2010    RIGHT MIDFOOT  . Tonsillectomy   . Eye surgeries     Several right  . Pacemaker insertion     SJM Pacemaker implant 9/10  . Pci stent to the right peroneal and right superficial as well as right popliteal arteries     Dr. Edilia Bo    ALLERGIES:   Morphine sulfate and Oxycodone hcl  Prior to Admit Meds:   Prescriptions prior to admission  Medication Sig Dispense Refill  . acetaminophen (TYLENOL) 500 MG tablet Take 500 mg by mouth every 6 (six) hours as needed. For pain      . Ascorbic Acid (VITAMIN C) 1000 MG tablet Take 1,000 mg by mouth  daily.        [name] aspirin EC 325 MG EC tablet Take 1 tablet (325 mg total) by mouth daily.      [name] diltiazem (CARDIZEM CD) 120 MG 24 hr capsule Take 1 capsule (120 mg total) by mouth daily.  30 capsule  6  . furosemide (LASIX) 40 MG tablet Take 1 tablet (40 mg total) by mouth daily.  30 tablet  6  . insulin aspart (NOVOLOG) 100 UNIT/ML injection Inject 5-9 Units into the skin 5 (five) times daily. Per sliding scale.  90-200: 5 units, 201-250: 6 units, 251-300: 7 units, 301-350: 8 units, 351-400: 9 units      . insulin glargine (  LANTUS) 100 UNIT/ML injection Inject 15 Units into the skin at bedtime.        [name] levothyroxine (SYNTHROID, LEVOTHROID) 75 MCG tablet Take 75 mcg by mouth daily.        [name] linagliptin (TRADJENTA) 5 MG TABS tablet Take 5 mg by mouth daily.        . midodrine (PROAMATINE) 5 MG tablet Take 5 mg by mouth 2 (two) times daily.        [name] pyridOXINE (VITAMIN B-6) 100 MG tablet Take 100 mg by mouth daily.        . sertraline (ZOLOFT) 100 MG tablet Take 100 mg by mouth daily.        . sotalol (BETAPACE) 80 MG tablet Take 1 tablet (80 mg total) by mouth daily.  30 tablet  6  . travoprost, benzalkonium, (TRAVATAN) 0.004 % ophthalmic solution Place 1 drop into both eyes at bedtime.        . nitroGLYCERIN (NITROSTAT) 0.4 MG SL tablet Place 0.4 mg under the tongue every 5 (five) minutes as needed. For chest pain       Family HX:    Family History  Problem Relation Age of Onset  . Cancer Brother     Colon cancer  . Aortic aneurysm Mother   . Colon polyps Neg Hx   . Liver disease Neg Hx     And no CRC   Social HX:    History   Social History  . Marital Status: Widowed    Spouse Name: N/A    Number of Children: 3  . Years of Education: N/A   Occupational History  . RETIRED    Social History Main Topics  . Smoking status: Never Smoker   . Smokeless tobacco: Never Used  . Alcohol Use: No  . Drug Use: No  . Sexually Active: No   Other Topics Concern  . Not on file   Social  History Narrative   Gets regular exercise.     ROS:  All 11 ROS were addressed and are negative except what is stated in the HPI  PHYSICAL EXAM Filed Vitals:   09/23/11 2030  BP: 167/73  Pulse: 71  Temp:   Resp: 26   General: Well developed, well nourished, in no acute distress Head: Eyes PERRLA, No xanthomas.   Normal cephalic and atramatic  Lungs:   Clear bilaterally to auscultation and percussion. Heart:   Irregularly irregular S1 S2 Pulses are 2+ & equal.            left carotid bruit. No JVD.  No abdominal bruits. No femoral bruits. Abdomen: Bowel sounds are positive, abdomen soft and non-tender without masses Extremities:   No clubbing, cyanosis or edema.  DP +1 Neuro: Alert and oriented X 3. Psych:  Good affect, responds appropriately   Labs:   Lab Results  Component Value Date   WBC 11.2* 09/23/2011   HGB 13.0 09/23/2011   HCT 39.3 09/23/2011   MCV 91.8 09/23/2011   PLT 217 09/23/2011    Lab 09/23/11 1200  NA 137  K 4.2  CL 101  CO2 30  BUN 27*  CREATININE 1.07  CALCIUM 10.0  PROT --  BILITOT --  ALKPHOS --  ALT --  AST --  GLUCOSE 297*   Lab Results  Component Value Date   CKTOTAL 29 08/14/2011   CKMB 2.3 08/14/2011   TROPONINI <0.30 09/23/2011   No results found for this basename: PTT   Lab Results  Component Value Date   INR 1.06 08/13/2011   INR 2.63* 09/23/2010   INR 2.13* 09/22/2010     Lab Results  Component Value Date   CHOL  Value: 210        ATP III CLASSIFICATION:  <200     mg/dL   Desirable  102-725  mg/dL   Borderline High  >=366    mg/dL   High       * 4/40/3474   CHOL  Value: 163        ATP III CLASSIFICATION:  <200     mg/dL   Desirable  259-563  mg/dL   Borderline High  >=875    mg/dL   High        64/11/3293   CHOL  Value: 138        ATP III CLASSIFICATION:  <200     mg/dL   Desirable  188-416  mg/dL   Borderline High  >=606    mg/dL   High        11/06/6008   Lab Results  Component Value Date   HDL 28* 10/21/2010   HDL 35*  08/09/2010   HDL 44 04/26/2009   Lab Results  Component Value Date   LDLCALC  Value: 136        Total Cholesterol/HDL:CHD Risk Coronary Heart Disease Risk Table                     Men   Women  1/2 Average Risk   3.4   3.3  Average Risk       5.0   4.4  2 X Average Risk   9.6   7.1  3 X Average Risk  23.4   11.0        Use the calculated Patient Ratio above and the CHD Risk Table to determine the patient's CHD Risk.        ATP III CLASSIFICATION (LDL):  <100     mg/dL   Optimal  932-355  mg/dL   Near or Above                    Optimal  130-159  mg/dL   Borderline  732-202  mg/dL   High  >542     mg/dL   Very High* 03/14/2375   LDLCALC  Value: 116        Total Cholesterol/HDL:CHD Risk Coronary Heart Disease Risk Table                     Men   Women  1/2 Average Risk   3.4   3.3  Average Risk       5.0   4.4  2 X Average Risk   9.6   7.1  3 X Average Risk  23.4   11.0        Use the calculated Patient Ratio above and the CHD Risk Table to determine the patient's CHD Risk.        ATP III CLASSIFICATION (LDL):  <100     mg/dL   Optimal  283-151  mg/dL   Near or Above                    Optimal  130-159  mg/dL   Borderline  761-607  mg/dL   High  >371     mg/dL   Very High* 02/08/6947   LDLCALC  Value: 79  Total Cholesterol/HDL:CHD Risk Coronary Heart Disease Risk Table                     Men   Women  1/2 Average Risk   3.4   3.3  Average Risk       5.0   4.4  2 X Average Risk   9.6   7.1  3 X Average Risk  23.4   11.0        Use the calculated Patient Ratio above and the CHD Risk Table to determine the patient's CHD Risk.        ATP III CLASSIFICATION (LDL):  <100     mg/dL   Optimal  161-096  mg/dL   Near or Above                    Optimal  130-159  mg/dL   Borderline  045-409  mg/dL   High  >811     mg/dL   Very High 05/22/7828   Lab Results  Component Value Date   TRIG 228* 10/21/2010   TRIG 60 08/09/2010   TRIG 73 04/26/2009   Lab Results  Component Value Date   CHOLHDL 7.5 10/21/2010   CHOLHDL  4.7 08/09/2010   CHOLHDL 3.1 04/26/2009   No results found for this basename: LDLDIRECT      Radiology:  *RADIOLOGY REPORT*  Clinical Data: Shortness of breath, history hypertension, coronary  disease, diabetes  PORTABLE CHEST - 1 VIEW  Comparison: Portable exam 1807 hours compared to 08/26/2011  Findings:  Left subclavian sequential transvenous pacemaker leads project at  right atrium and right ventricle.  Enlargement of cardiac silhouette with pulmonary vascular  congestion.  Interstitial infiltrates bilaterally compatible with pulmonary  edema and CHF.  No gross pleural effusion or pneumothorax.  Bones demineralized.  IMPRESSION:  CHF.  Original Report Authenticated By: [name], M.D.   EKG:  Atrial fibrillation with RVR and occasional aberration with faster rated up to 180bpm, 1mm J point depression with downsloping ST segments inferolaterally  ASSESSMENT:  1.  New onset atrial fibrillation with RVR 2.  Chest pain likely secondary to #1 3.  Acute diastolic heart failure secondary to #1 4.  History of atrial flutter on sotolol 5.  CAD s/p NSTEMI 2010 with 90% OM too small for PCI and nonobstructive disease elsewhere 6.  HTN 7.  Type II DM 8.  CKD 9.  S/P PPM 10. Tachycardia induced CM EF 55-60% 08/14/2011  PLAN:   1.  Admit to Stepdown telemetry 2.  Cycle cardiac enzymes 3.  IV NTG gtt 4.  IV Heparin gtt per pharmacy protocol 5.  Continue statin/ASA 6.  IV Lasix for CHF 7.  IV Cardizem gtt and titrate for rate control 8.  Check TSH 9.  BMET/BNP in am  Quintella Reichert, MD  09/23/2011  10:43 PM

## 2011-09-23 NOTE — ED Notes (Signed)
Attempted IV x 2 without success.  Patient denies chest pain at this time; continues to c/o shortness of breath.  Family member remains at bedside.

## 2011-09-23 NOTE — ED Notes (Signed)
Patient's heart rate fluctuating between 70 and 200.  Dr. Rubin Payor aware and order received for Cardizem.  Medicated per order.

## 2011-09-23 NOTE — ED Notes (Signed)
Attempted IV access  X 1  Without success

## 2011-09-23 NOTE — ED Provider Notes (Addendum)
This chart was scribed for [employer]. Colon Branch, MD by Williemae Natter. The patient was seen in room APA18/APA18 at 11:19 AM   CSN: [id number]  Arrival date & time 09/23/11  1101   First MD Initiated Contact with Patient 09/23/11 1122      Chief Complaint  Patient presents with  . Chest Pain    (Consider location/radiation/quality/duration/timing/severity/associated sxs/prior treatment) HPI Brittney [name] is a 78 y.o. female with a history of atrial fibrillation with a pacemaker who presents to the Emergency Department complaining of acute onset chest pain. Pt's pacemaker does not immediately respond to a drop below the set rate of 60  in her heart rate. She was seen at [facility] on the 18th for an increase in her atrial fibrillation episodes. Pt states that the chest pain woke her from sleep last night. Daughter says that pt is often dizzy when coming out of afib episodes.  PCP Dr. Felecia Shelling Cardiologist: Dr. Myrtis Ser  Past Medical History  Diagnosis Date  . Hypertension   . Dyslipidemia   . CAD (coronary artery disease)     Non-STEMI August, 2010,,,, 40% left main, 30% LAD, 60% OM 2, 40% RCA, 90% OM1-culprit lesion-too small for intervention           . Ejection fraction     EF 55% ,2011 / EF 30% with tachycardia cardiomyopathy / EF 50% January, 2012  . Diabetes mellitus   . Dementia     ?? Early dementia ?? 2012  . GERD (gastroesophageal reflux disease)   . TIA (transient ischemic attack)     History of TIAs  . Hypothyroidism   . PAD (peripheral artery disease)     Carotid endarterectomy, right. Stents right peroneal, right superficial, right popliteal arteries, Dr. Edilia Bo  . Amputation     Right midfoot 2010  . CKD (chronic kidney disease)     Creatinine 1.19 at December 2012 discharge  . Gait disorder   . Vertebrobasilar insufficiency   . Depression   . Hyperkalemia     ACE Inhibitor held  . Acute renal insufficiency     Catheterization August 2010  . Blindness of right eye   .  Respiratory failure     Hypoxic and hypercapnic, 2011, etiology pneumonia  . Leukocytosis 5/11    Persistent post pneumonia  . Orthostatic hypotension     Syncope January 2012 with facial trauma, amiodarone stopped  . Tachycardia induced cardiomyopathy     Previous EF 30%.  Most recently 55-60% by echo 08/14/11  . Atrial fibrillation 8/10    Coumadin use in the past.  Patient decided August, 2012,That she did not want Coumadin restart  /    Rapid rate; Coumadin started TE vardioversion done 04/30/09; amiodarone, beta blockers during hospitalization 8/10; beta blockers lower blood pressure excessively without heart rate control  . Ventricular tachycardia     20 beats December, 2012, asymptomatic  . Atrial flutter 05/24/09    Hospitalization in [place]; with difficult rate control; TEE cardioversion done EF 30%, secondary to tachycardia, pt did convert back tosinus rhythm; return 10/11, amiodarone restarted 10/11, no longer on it. Recurrence with RVR 08/2011.  . Bradycardia, severe sinus 05/31/09    [facility]; cardioversion, chest compressions, pacemaker placed 05/2009 [facility])  . Anemia in CKD (chronic kidney disease)   . Gastroparesis 3/11    Abnormal GES  . Drug therapy,  Sotolol     Sotolol started 08/2011.    Past Surgical History  Procedure Date  .  Total abdominal hysterectomy   . Cholecystectomy   . Carotid endarterectomy   . Amputation 2010    RIGHT MIDFOOT  . Tonsillectomy   . Eye surgeries     Several right  . Pacemaker insertion     SJM Pacemaker implant 9/10  . Pci stent to the right peroneal and right superficial as well as right popliteal arteries     Dr. Edilia Bo    Family History  Problem Relation Age of Onset  . Cancer Brother     Colon cancer  . Aortic aneurysm Mother   . Colon polyps Neg Hx   . Liver disease Neg Hx     And no CRC    History  Substance Use Topics  . Smoking status: Never Smoker   . Smokeless tobacco: Never Used  . Alcohol  Use: No    OB History    Grav Para Term Preterm Abortions TAB SAB Ect Mult Living                  Review of Systems 10 Systems reviewed and are negative for acute change except as noted in the HPI.  Allergies  Morphine sulfate and Oxycodone hcl  Home Medications   Current Outpatient Rx  Name Route Sig Dispense Refill  . ACETAMINOPHEN 500 MG PO TABS Oral Take 500 mg by mouth every 6 (six) hours as needed. For pain    . VITAMIN C 1000 MG PO TABS Oral Take 1,000 mg by mouth daily.      . ASPIRIN 325 MG PO TBEC Oral Take 1 tablet (325 mg total) by mouth daily.    [name] DILTIAZEM HCL ER COATED BEADS 120 MG PO CP24 Oral Take 1 capsule (120 mg total) by mouth daily. 30 capsule 6  . FUROSEMIDE 40 MG PO TABS Oral Take 1 tablet (40 mg total) by mouth daily. 30 tablet 6  . INSULIN ASPART 100 UNIT/ML [place] SOLN Subcutaneous Inject 5-9 Units into the skin 5 (five) times daily. Per sliding scale.  90-200: 5 units, 201-250: 6 units, 251-300: 7 units, 301-350: 8 units, 351-400: 9 units    . INSULIN GLARGINE 100 UNIT/ML [place] SOLN Subcutaneous Inject 15 Units into the skin at bedtime.      [name] LEVOTHYROXINE SODIUM 75 MCG PO TABS Oral Take 75 mcg by mouth daily.      [name] LINAGLIPTIN 5 MG PO TABS Oral Take 5 mg by mouth daily.      [name] MIDODRINE HCL 5 MG PO TABS Oral Take 5 mg by mouth 2 (two) times daily.      [name] NITROGLYCERIN 0.4 MG SL SUBL Sublingual Place 0.4 mg under the tongue every 5 (five) minutes as needed. For chest pain    . VITAMIN B-6 100 MG PO TABS Oral Take 100 mg by mouth daily.      [name] ROSUVASTATIN CALCIUM 20 MG PO TABS Oral Take 1 tablet (20 mg total) by mouth daily. 30 tablet 3  . SERTRALINE HCL 100 MG PO TABS Oral Take 100 mg by mouth daily.      [name] SOTALOL HCL 80 MG PO TABS Oral Take 1 tablet (80 mg total) by mouth daily. 30 tablet 6  . TRAVOPROST 0.004 % OP SOLN Both Eyes Place 1 drop into both eyes at bedtime.        BP 152/102  Pulse 63  Temp(Src) 97.9 F (36.6 C) (Oral)  Resp 19  Ht 5'  7" (1.702 m)  Wt 162 lb (  73.483 kg)  BMI 25.37 kg/m2  SpO2 97%  Physical Exam  Nursing note and vitals reviewed. Constitutional: She is oriented to person, place, and time. She appears well-developed and well-nourished.  HENT:  Head: Normocephalic and atraumatic.  Eyes: Conjunctivae and EOM are normal. Pupils are equal, round, and reactive to light.  Neck: Normal range of motion. Neck supple.  Cardiovascular: Normal heart sounds and intact distal pulses.        Rapid irregularly irregular rate. Pacemaker in L chestwall  Pulmonary/Chest: Effort normal and breath sounds normal.  Abdominal: Soft. Bowel sounds are normal.  Musculoskeletal: Normal range of motion.  Neurological: She is alert and oriented to person, place, and time. She has normal reflexes.  Skin: Skin is warm and dry.    ED Course  Procedures (including critical care time) Results for orders placed during the hospital encounter of 09/23/11  POCT I-STAT TROPONIN I      Component Value Range   Troponin i, poc 0.00  0.00 - 0.08 (ng/mL)   Comment 3           CBC      Component Value Range   WBC 11.2 (*) 4.0 - 10.5 (K/uL)   RBC 4.28  3.87 - 5.11 (MIL/uL)   Hemoglobin 13.0  12.0 - 15.0 (g/dL)   HCT 96.0  45.4 - 09.8 (%)   MCV 91.8  78.0 - 100.0 (fL)   MCH 30.4  26.0 - 34.0 (pg)   MCHC 33.1  30.0 - 36.0 (g/dL)   RDW 11.9  14.7 - 82.9 (%)   Platelets 217  150 - 400 (K/uL)  DIFFERENTIAL      Component Value Range   Neutrophils Relative 48  43 - 77 (%)   Neutro Abs 5.4  1.7 - 7.7 (K/uL)   Lymphocytes Relative 41  12 - 46 (%)   Lymphs Abs 4.5 (*) 0.7 - 4.0 (K/uL)   Monocytes Relative 8  3 - 12 (%)   Monocytes Absolute 0.9  0.1 - 1.0 (K/uL)   Eosinophils Relative 4  0 - 5 (%)   Eosinophils Absolute 0.4  0.0 - 0.7 (K/uL)   Basophils Relative 0  0 - 1 (%)   Basophils Absolute 0.0  0.0 - 0.1 (K/uL)  BASIC METABOLIC PANEL      Component Value Range   Sodium 137  135 - 145 (mEq/L)   Potassium 4.2  3.5 - 5.1 (mEq/L)    Chloride 101  96 - 112 (mEq/L)   CO2 30  19 - 32 (mEq/L)   Glucose, Bld 297 (*) 70 - 99 (mg/dL)   BUN 27 (*) 6 - 23 (mg/dL)   Creatinine, Ser 5.62  0.50 - 1.10 (mg/dL)   Calcium 13.0  8.4 - 10.5 (mg/dL)   GFR calc non Af Amer 49 (*) >90 (mL/min)   GFR calc Af Amer 57 (*) >90 (mL/min)  PRO B NATRIURETIC PEPTIDE      Component Value Range   Pro B Natriuretic peptide (BNP) 3559.0 (*) 0 - 450 (pg/mL)  TROPONIN I      Component Value Range   Troponin I <0.30  <0.30 (ng/mL)  URINALYSIS, ROUTINE W REFLEX MICROSCOPIC      Component Value Range   Color, Urine YELLOW  YELLOW    APPearance CLEAR  CLEAR    Specific Gravity, Urine 1.010  1.005 - 1.030    pH 5.5  5.0 - 8.0    Glucose, UA 100 (*) NEGATIVE (mg/dL)  Hgb urine dipstick TRACE (*) NEGATIVE    Bilirubin Urine NEGATIVE  NEGATIVE    Ketones, ur NEGATIVE  NEGATIVE (mg/dL)   Protein, ur NEGATIVE  NEGATIVE (mg/dL)   Urobilinogen, UA 0.2  0.0 - 1.0 (mg/dL)   Nitrite NEGATIVE  NEGATIVE    Leukocytes, UA NEGATIVE  NEGATIVE   URINE MICROSCOPIC-ADD ON      Component Value Range   Squamous Epithelial / LPF RARE  RARE    WBC, UA 3-6  <3 (WBC/hpf)   RBC / HPF 0-2  <3 (RBC/hpf)     Date: 09/23/2011  1108  Rate:150  Rhythm: atrial fibrillation with a rapid ventricular response  QRS Axis: normal  Intervals: atrial fibrillation  ST/T Wave abnormalities: ST depressions diffusely  Conduction Disutrbances:afib  Narrative Interpretation:   Old EKG Reviewed: changes noted c/w 12/21 12  New atrial fibrillation with rapid response 1215 Spoke with Dr. Imelda Pillow. He suggested admission at [facility] with cardiology consult in the AM. Pacemaker can be interrogated.  1217 Spoke with Dr. [name]. He will contact pacemaker rep for evaluation. He suggested if there is a pacemaker malfunction, patient be admitted to [facility].  1230 Awaiting pacemaker rep.  MDM   Patient with h/o atrial fibrillation and brady rhythm with demand pacemaker. Patient arrived in afib  with rapid response which resolved on its own, Followed by a 2 minutes of bradycardia (40s) which was not paced. Has remained in a paced rhythm since. Awaiting interrogation of pacemaker.  Plan ito transfer to [facility] once interrogation complete if pacemaker malfunction or with worsening of condition.1605 Care/dispostion of patient to Dr. Rubin Payor.  I personally performed the services described in this documentation, which was scribed in my presence. The recorded information has been reviewed and considered.  CRITICAL CARE Performed by: Annamarie Dawley.   Total critical care time: 40 Critical care time was exclusive of separately billable procedures and treating other patients.  Critical care was necessary to treat or prevent imminent or life-threatening deterioration.  Critical care was time spent personally by me on the following activities: development of treatment plan with patient and/or surrogate as well as nursing, discussions with consultants, evaluation of patient's response to treatment, examination of patient, obtaining history from patient or surrogate, ordering and performing treatments and interventions, ordering and review of laboratory studies, ordering and review of radiographic studies, pulse oximetry and re-evaluation of patient's condition.  The patient in signout from Dr. Colon Branch. She had A. fib with RVR and resolve spontaneously. After resolution she had an episode of bradycardia witnessed on the monitor by the nurses. It lasted a minute or 2. It was not paced at that time. Pacemaker rapid is calm and interrogated the pacemaker. She states that there was no bradycardic episodes that were monitored. The patient was transferred for to [facility] for further evaluation. I discussed with Dr. Camillo Flaming, who accepted the patient and transfer.    Patient is now had a return of her chest pain. She is increasing dyspnea. Her EKG just shows her paced 68 beats per minute. Her  x-ray shows apparent CHF. With the chest pain she will require a higher level of care than the telemetry bed that she was assigned to. bed controls call there are no step down beds at [facility]. We will attempt to relieve the pain and stabilize her to get her for the telemetry bed.       Juliet Rude. Rubin Payor, MD 09/23/11 1656  Juliet Rude. Rubin Payor, MD 09/23/11 1815 Patient  continues to have dyspnea, chest pain is resolved. Nitroglycerin drip will be held. She has urinated with Lasix. She appears in less distress, but still has some dyspnea on exam.  Juliet Rude. Rubin Payor, MD 09/23/11 2013  Patient still is mild dyspnea. Her sugars improved somewhat. I've increased the nitroglycerin to 10. Since there are no available step down beds at [facility], she'll be transferred to the CDU in the ER. She needs to be seen by cardiology which is not available here at this time. She continues to not be in A. fib RVR.  Juliet Rude. Rubin Payor, MD 09/23/11 2049  [place] cardiology is to be called upon arrival.  Juliet Rude. Rubin Payor, MD 09/23/11 2050  Patient is developed A. fib with RVR again. We'll try a small dose of Cardizem for rate control.  Juliet Rude. Rubin Payor, MD 09/23/11 2105  Patient now has an available step down bed at [facility]. She'll be transferred  [employer]. Rubin Payor, MD 09/23/11 2118  Nicoletta Dress. Colon Branch, MD 09/24/11 [phone]

## 2011-09-23 NOTE — ED Notes (Signed)
Pt reports mid sternal chest pain that began last night.  Pt reports hx of afib.  Pt hr in triage is 159.  Pt denies any sob, n/v, or radiating pain.

## 2011-09-23 NOTE — ED Notes (Signed)
[place](Dr. [name] ) office called at ext. [phone].

## 2011-09-23 NOTE — ED Notes (Signed)
Pt's initial rhythm on monitor was SVT - pt then began to brady down to 40 bpm without capture of pacemaker, edp notified.  Pt's HR now 60-70 bpm NSR with capture.

## 2011-09-23 NOTE — ED Notes (Signed)
Called [facility] for [facility] for Dr. Colon Branch.

## 2011-09-23 NOTE — ED Notes (Signed)
Spoke with Archie Patten at [facility] - states Pacemaker representative has been called and left message with no return call back to come and assess pt's pacemaker.  Archie Patten will call back again.

## 2011-09-23 NOTE — ED Notes (Signed)
Rep here from [facility] to interrogate pacemaker

## 2011-09-23 NOTE — ED Notes (Signed)
Pt c/o chest heaviness and increased sob with labored breathing, pt speaking short phrases at this time, vitals wnl.  edp notified, orders received.

## 2011-09-24 ENCOUNTER — Other Ambulatory Visit: Payer: Self-pay

## 2011-09-24 DIAGNOSIS — I4891 Unspecified atrial fibrillation: Principal | ICD-10-CM

## 2011-09-24 LAB — COMPREHENSIVE METABOLIC PANEL
ALT: 21 U/L (ref 0–35)
AST: 22 U/L (ref 0–37)
Albumin: 3.5 g/dL (ref 3.5–5.2)
Alkaline Phosphatase: 96 U/L (ref 39–117)
GFR calc Af Amer: 61 mL/min — ABNORMAL LOW (ref >90)
Glucose, Bld: 347 mg/dL — ABNORMAL HIGH (ref 70–99)
Potassium: 4.5 mEq/L (ref 3.5–5.1)
Sodium: 134 mEq/L — ABNORMAL LOW (ref 135–145)
Total Protein: 7.2 g/dL (ref 6.0–8.3)

## 2011-09-24 LAB — GLUCOSE, CAPILLARY
Glucose-Capillary: 257 mg/dL — ABNORMAL HIGH (ref 70–99)
Glucose-Capillary: 179 mg/dL — ABNORMAL HIGH (ref 70–99)

## 2011-09-24 LAB — CBC
HCT: 38.2 % (ref 36.0–46.0)
Hemoglobin: 12.6 g/dL (ref 12.0–15.0)
Hemoglobin: 12.9 g/dL (ref 12.0–15.0)
MCHC: 33.6 g/dL (ref 30.0–36.0)
MCV: 90.5 fL (ref 78.0–100.0)
Platelets: 217 10*3/uL (ref 150–400)
RBC: 4.22 MIL/uL (ref 3.87–5.11)
RBC: 4.24 MIL/uL (ref 3.87–5.11)
WBC: 13.6 10*3/uL — ABNORMAL HIGH (ref 4.0–10.5)

## 2011-09-24 LAB — DIFFERENTIAL
Basophils Relative: 0 % (ref 0–1)
Monocytes Relative: 7 % (ref 3–12)
Neutro Abs: 12.2 10*3/uL — ABNORMAL HIGH (ref 1.7–7.7)
Neutrophils Relative %: 75 % (ref 43–77)

## 2011-09-24 LAB — URINE CULTURE
Colony Count: NO GROWTH
Culture: NO GROWTH

## 2011-09-24 LAB — CARDIAC PANEL(CRET KIN+CKTOT+MB+TROPI)
CK, MB: 2.1 ng/mL (ref 0.3–4.0)
CK, MB: 2.2 ng/mL (ref 0.3–4.0)
CK, MB: 2.3 ng/mL (ref 0.3–4.0)
Total CK: 38 U/L (ref 7–177)
Troponin I: <0.3 ng/mL (ref <0.30)

## 2011-09-24 LAB — BASIC METABOLIC PANEL
BUN: 27 mg/dL — ABNORMAL HIGH (ref 6–23)
CO2: 28 mEq/L (ref 19–32)
Chloride: 97 mEq/L (ref 96–112)
Creatinine, Ser: 1.07 mg/dL (ref 0.50–1.10)
Glucose, Bld: 403 mg/dL — ABNORMAL HIGH (ref 70–99)
Potassium: 4.2 mEq/L (ref 3.5–5.1)

## 2011-09-24 LAB — HEPARIN LEVEL (UNFRACTIONATED): Heparin Unfractionated: 0.35 IU/mL (ref 0.30–0.70)

## 2011-09-24 LAB — PROTIME-INR
INR: 1.15 (ref 0.00–1.49)
Prothrombin Time: 14.9 seconds (ref 11.6–15.2)

## 2011-09-24 LAB — TSH: TSH: 1.49 u[IU]/mL (ref 0.350–4.500)

## 2011-09-24 LAB — MRSA PCR SCREENING: MRSA by PCR: NEGATIVE

## 2011-09-24 MED ORDER — SODIUM CHLORIDE 0.9 % IV SOLN
INTRAVENOUS | Status: DC
  Administered 2011-09-24 (×2): 10 mL/h via INTRAVENOUS

## 2011-09-24 MED ORDER — SODIUM CHLORIDE 0.9 % IV SOLN: INTRAVENOUS | Status: DC

## 2011-09-24 MED ORDER — HEPARIN BOLUS VIA INFUSION
3000.0000 [IU] | Freq: Once | INTRAVENOUS | Status: AC
  Administered 2011-09-24: 3000 [IU] via INTRAVENOUS
  Filled 2011-09-24: qty 3000

## 2011-09-24 MED ORDER — HEPARIN SOD (PORCINE) IN D5W 100 UNIT/ML IV SOLN
1300.0000 [IU]/h | INTRAVENOUS | Status: DC
  Administered 2011-09-24: 1200 [IU]/h via INTRAVENOUS
  Administered 2011-09-24 – 2011-09-25 (×3): 1300 [IU]/h via INTRAVENOUS
  Filled 2011-09-24 (×5): qty 250

## 2011-09-24 MED FILL — Dextrose Inj 5%: INTRAVENOUS | Qty: 100 | Status: AC

## 2011-09-24 NOTE — Progress Notes (Signed)
Patient ID: Brittney [name], female   DOB: 1931/09/19, 78 y.o.   MRN: [id number] Subjective:  Dyspnea improved but still present.  Objective:  Vital Signs in the last 24 hours: Temp:  [98.2 F (36.8 C)-99.5 F (37.5 C)] 98.7 F (37.1 C) (01/16 1148) Pulse Rate:  [58-157] 125  (01/16 1200) Resp:  [16-29] 18  (01/16 1200) BP: (92-179)/(46-82) 109/63 mmHg (01/16 1200) SpO2:  [95 %-98 %] 98 % (01/16 1200) Weight:  [74.4 kg (164 lb 0.4 oz)] 74.4 kg (164 lb 0.4 oz) (01/15 2245)  Intake/Output from previous day: 01/15 0701 - 01/16 0700 In: 520.3 [P.O.:240; I.V.:280.3] Out: 550 [Urine:550] Intake/Output from this shift: Total I/O In: 284 [I.V.:284] Out: 800 [Urine:800]  Physical Exam: Well appearing NAD HEENT: Unremarkable Neck:  No JVD, no thyromegall Lungs:  Clear except for basilar rales. HEART:  Regular tachy, no murmurs, no rubs, no clicks Abd:  Flat, positive bowel sounds, no organomegally, no rebound, no guarding Ext:  2 plus pulses, no edema, no cyanosis, no clubbing Skin:  No rashes no nodules Neuro:  CN II through XII intact, motor grossly intact  Lab Results:  Basename 09/24/11 0844 09/23/11 2345  WBC 13.6* 16.3*  HGB 12.6 12.9  PLT 210 217    Basename 09/24/11 0535 09/23/11 2345  NA 134* 134*  K 4.2 4.5  CL 97 97  CO2 28 26  GLUCOSE 403* 347*  BUN 27* 26*  CREATININE 1.07 1.01    Basename 09/24/11 1146 09/24/11 0535  TROPONINI <0.30 <0.30   Hepatic Function Panel  Basename 09/23/11 2345  PROT 7.2  ALBUMIN 3.5  AST 22  ALT 21  ALKPHOS 96  BILITOT 1.2  BILIDIR --  IBILI --   No results found for this basename: CHOL in the last 72 hours No results found for this basename: PROTIME in the last 72 hours  Imaging: Dg Chest Port 1 View  09/23/2011  *RADIOLOGY REPORT*  Clinical Data: Shortness of breath, history hypertension, coronary disease, diabetes  PORTABLE CHEST - 1 VIEW  Comparison: Portable exam 1807 hours compared to 08/26/2011  Findings:  Left subclavian sequential transvenous pacemaker leads project at right atrium and right ventricle. Enlargement of cardiac silhouette with pulmonary vascular congestion. Interstitial infiltrates bilaterally compatible with pulmonary edema and CHF. No gross pleural effusion or pneumothorax. Bones demineralized.  IMPRESSION: CHF.  Original Report Authenticated By: [name], M.D.    Cardiac Studies: Tele - atrial flutter with a RVR Assessment/Plan:  1. Atrial fib/flutter - she is currently in flutter with an RVR despite IV calcium channel blockers. She is also on sotalol. At this point she has a PPM and I wonder if rate control with AV node ablation might be her best option. She has not been on anti-coagulation previously as she had refused. Amiodarone was not previously tolerated ( autonomic dysfunction). Will continue cardizem and heparin for now. 2. Prior tachy induced CM - along with the difficulty we have with controlling her rate, this would be another reason to consider AV node ablation. In addition she became hypotensive on beta blockers previously. 3. PPM - will interogate her device to see how long she has been out of rhythm.   LOS: 1 day    Lewayne Bunting 09/24/2011, 4:38 PM

## 2011-09-24 NOTE — Progress Notes (Signed)
ANTICOAGULATION CONSULT NOTE - Follow Up Consult  Pharmacy Consult for Heparin Indication: atrial fibrillation  Allergies  Allergen Reactions  . Morphine Sulfate     REACTION: jerking  . Oxycodone Hcl Itching and Other (See Comments)    Feels weird    Patient Measurements: Height: 5\' 7"  (170.2 cm) Weight: 164 lb 0.4 oz (74.4 kg) IBW/kg (Calculated) : 61.6  Dosing Weight: 74.4 kgkg  Vital Signs: Temp: 98.7 F (37.1 C) (01/16 0824) Temp src: Oral (01/16 0824) BP: 112/57 mmHg (01/16 0824) Pulse Rate: 125  (01/16 0824)  Labs:  Basename 09/24/11 0844 09/24/11 0535 09/23/11 2345 09/23/11 2343 09/23/11 1200  HGB 12.6 -- 12.9 -- --  HCT 38.2 -- 38.4 -- 39.3  PLT 210 -- 217 -- 217  APTT -- -- -- -- --  LABPROT -- -- 14.9 -- --  INR -- -- 1.15 -- --  HEPARINUNFRC 0.35 -- -- -- --  CREATININE -- 1.07 1.01 -- 1.07  CKTOTAL -- 40 -- 42 --  CKMB -- 2.2 -- 2.1 --  TROPONINI -- <0.30 -- <0.30 <0.30   Estimated Creatinine Clearance: 46.4 ml/min (by C-G formula based on Cr of 1.07).  Assessment:    Initial heparin level on 1200 units/hr is low therapeutic.  Goal of Therapy:  Heparin level 0.3-0.7 units/ml   Plan:     Will increase drip to 1300 units/hr, to try to keep heparin level at goal.  Next level and CBC in AM.  Dennie Fetters Pager: [phone] 09/24/2011,11:06 AM

## 2011-09-25 LAB — CBC
HCT: 36.9 % (ref 36.0–46.0)
Hemoglobin: 12.1 g/dL (ref 12.0–15.0)
MCHC: 32.8 g/dL (ref 30.0–36.0)
MCV: 90.2 fL (ref 78.0–100.0)
RDW: 13.4 % (ref 11.5–15.5)

## 2011-09-25 LAB — GLUCOSE, CAPILLARY
Glucose-Capillary: 255 mg/dL — ABNORMAL HIGH (ref 70–99)
Glucose-Capillary: 253 mg/dL — ABNORMAL HIGH (ref 70–99)
Glucose-Capillary: 354 mg/dL — ABNORMAL HIGH (ref 70–99)
Glucose-Capillary: 133 mg/dL — ABNORMAL HIGH (ref 70–99)

## 2011-09-25 LAB — BASIC METABOLIC PANEL
CO2: 28 mEq/L (ref 19–32)
Calcium: 9.3 mg/dL (ref 8.4–10.5)
Chloride: 96 mEq/L (ref 96–112)
Creatinine, Ser: 1.26 mg/dL — ABNORMAL HIGH (ref 0.50–1.10)
Glucose, Bld: 272 mg/dL — ABNORMAL HIGH (ref 70–99)
Sodium: 133 mEq/L — ABNORMAL LOW (ref 135–145)

## 2011-09-25 LAB — HEPARIN LEVEL (UNFRACTIONATED): Heparin Unfractionated: 0.35 IU/mL (ref 0.30–0.70)

## 2011-09-25 NOTE — Progress Notes (Signed)
Inpatient Diabetes Program Recommendations  AACE/ADA: New Consensus Statement on Inpatient Glycemic Control (2009)  Target Ranges:  Prepandial:   less than 140 mg/dL      Peak postprandial:   less than 180 mg/dL (1-2 hours)      Critically ill patients:  140 - 180 mg/dL   Reason: Fasting ZOX=[id number]   Inpatient Diabetes Program Recommendations Insulin - Basal: Increase Lantus to 20 units

## 2011-09-25 NOTE — Progress Notes (Signed)
Patient ID: Brittney [name], female   DOB: [birth year], 78 y.o.   MRN: [id number] Subjective:  No chest pain or sob. Still out of rhythm.  Objective:  Vital Signs in the last 24 hours: Temp:  [98.3 F (36.8 C)-98.7 F (37.1 C)] 98.6 F (37 C) (01/17 0837) Pulse Rate:  [120-125] 124  (01/17 0700) Resp:  [14-21] 21  (01/17 0900) BP: (93-119)/(52-71) 97/52 mmHg (01/17 0900) SpO2:  [96 %-99 %] 99 % (01/17 0837)  Intake/Output from previous day: 01/16 0701 - 01/17 0700 In: 2118.5 [P.O.:1080; I.V.:1038.5] Out: 2300 [Urine:2300] Intake/Output from this shift:    Physical Exam: Elderly appearing NAD HEENT: Unremarkable Neck:  No JVD, no thyromegally Lungs:  Clear with no wheezes. HEART:  Regular tachy rhythm, no murmurs, no rubs, no clicks Abd:  Flat, positive bowel sounds, no organomegally, no rebound, no guarding Ext:  2 plus pulses, no edema, no cyanosis, no clubbing Skin:  No rashes no nodules Neuro:  CN II through XII intact, motor grossly intact  Lab Results:  Basename 09/25/11 0535 09/24/11 0844  WBC 12.3* 13.6*  HGB 12.1 12.6  PLT 228 210    Basename 09/25/11 0535 09/24/11 0535  NA 133* 134*  K 3.8 4.2  CL 96 97  CO2 28 28  GLUCOSE 272* 403*  BUN 34* 27*  CREATININE 1.26* 1.07    Basename 09/24/11 1146 09/24/11 0535  TROPONINI <0.30 <0.30   Hepatic Function Panel  Basename 09/23/11 2345  PROT 7.2  ALBUMIN 3.5  AST 22  ALT 21  ALKPHOS 96  BILITOT 1.2  BILIDIR --  IBILI --   No results found for this basename: CHOL in the last 72 hours No results found for this basename: PROTIME in the last 72 hours  Imaging: Dg Chest Port 1 View  09/23/2011  *RADIOLOGY REPORT*  Clinical Data: Shortness of breath, history hypertension, coronary disease, diabetes  PORTABLE CHEST - 1 VIEW  Comparison: Portable exam 1807 hours compared to 08/26/2011  Findings: Left subclavian sequential transvenous pacemaker leads project at right atrium and right ventricle. Enlargement  of cardiac silhouette with pulmonary vascular congestion. Interstitial infiltrates bilaterally compatible with pulmonary edema and CHF. No gross pleural effusion or pneumothorax. Bones demineralized.  IMPRESSION: CHF.  Original Report Authenticated By: [name], M.D.    Cardiac Studies: Tele - atrial flutter (atypical) with 2:1 AV conduction Assessment/Plan:  1. Persistent atrial flutter - today I have tried to pace the patient back to NSR. She goes transiently into atrial fib and then back to atrial flutter. This does not appear to be due to typical flutter. I have recommended proceeding with AV node ablation as she has been refractory to rate control.  LOS: 2 days    Lewayne Bunting 09/25/2011, 11:38 AM

## 2011-09-25 NOTE — Progress Notes (Signed)
ANTICOAGULATION CONSULT NOTE - Follow Up Consult  Pharmacy Consult for Heparin Indication: atrial fibrillation/flutter  Allergies  Allergen Reactions  . Morphine Sulfate     REACTION: jerking  . Oxycodone Hcl Itching and Other (See Comments)    Feels weird    Patient Measurements: Height: 5\' 7"  (170.2 cm) Weight: 164 lb 0.4 oz (74.4 kg) IBW/kg (Calculated) : 61.6  Dosing Weight: 74.4 kg  Vital Signs: Temp: 98.6 F (37 C) (01/17 0837) Temp src: Oral (01/17 0837) BP: 97/52 mmHg (01/17 0900) Pulse Rate: 124  (01/17 0700)  Labs:  Basename 09/25/11 0535 09/24/11 1146 09/24/11 0844 09/24/11 0535 09/23/11 2345 09/23/11 2343  HGB 12.1 -- 12.6 -- -- --  HCT 36.9 -- 38.2 -- 38.4 --  PLT 228 -- 210 -- 217 --  APTT -- -- -- -- -- --  LABPROT -- -- -- -- 14.9 --  INR -- -- -- -- 1.15 --  HEPARINUNFRC 0.35 -- 0.35 -- -- --  CREATININE 1.26* -- -- 1.07 1.01 --  CKTOTAL -- 38 -- 40 -- 42  CKMB -- 2.3 -- 2.2 -- 2.1  TROPONINI -- <0.30 -- <0.30 -- <0.30   Estimated Creatinine Clearance: 39.4 ml/min (by C-G formula based on Cr of 1.26).  Assessment:    Heparin level is therapeutic on 1300 units/hr.  Goal of Therapy:  Heparin level 0.3-0.7 units/ml   Plan:    Continue heparin at current rate, 1300 units/hr.    Next level and CBC in AM.  Dennie Fetters Pager: [phone] 09/25/2011,10:40 AM

## 2011-09-26 ENCOUNTER — Encounter (HOSPITAL_COMMUNITY)
Admission: EM | Disposition: A | Discharge status: Home / Self Care | Payer: Self-pay | Source: Home / Self Care | Attending: Cardiovascular Disease

## 2011-09-26 ENCOUNTER — Other Ambulatory Visit: Payer: Self-pay

## 2011-09-26 DIAGNOSIS — I4891 Unspecified atrial fibrillation: Secondary | ICD-10-CM

## 2011-09-26 HISTORY — PX: AV NODE ABLATION: SHX5458

## 2011-09-26 LAB — CBC
HCT: 36.7 % (ref 36.0–46.0)
MCHC: 33.5 g/dL (ref 30.0–36.0)
RDW: 13.3 % (ref 11.5–15.5)
WBC: 14.3 10*3/uL — ABNORMAL HIGH (ref 4.0–10.5)

## 2011-09-26 LAB — HEPARIN LEVEL (UNFRACTIONATED): Heparin Unfractionated: 0.36 IU/mL (ref 0.30–0.70)

## 2011-09-26 SURGERY — AV NODE ABLATION
Anesthesia: LOCAL

## 2011-09-26 MED ORDER — FENTANYL CITRATE 0.05 MG/ML IJ SOLN
INTRAMUSCULAR | Status: AC
  Filled 2011-09-26: qty 2

## 2011-09-26 MED ORDER — ONDANSETRON HCL 4 MG/2ML IJ SOLN: 4.0000 mg | Freq: Four times a day (QID) | INTRAMUSCULAR | Status: DC | PRN

## 2011-09-26 MED ORDER — MIDAZOLAM HCL 5 MG/5ML IJ SOLN
INTRAMUSCULAR | Status: AC
  Filled 2011-09-26: qty 5

## 2011-09-26 MED ORDER — SODIUM CHLORIDE 0.9 % IV SOLN: 250.0000 mL | INTRAVENOUS | Status: DC | PRN

## 2011-09-26 MED ORDER — ACETAMINOPHEN 325 MG PO TABS: 650.0000 mg | ORAL_TABLET | ORAL | Status: DC | PRN

## 2011-09-26 MED ORDER — HEPARIN (PORCINE) IN NACL 2-0.9 UNIT/ML-% IJ SOLN
INTRAMUSCULAR | Status: AC
  Filled 2011-09-26: qty 1000

## 2011-09-26 MED ORDER — BUPIVACAINE HCL (PF) 0.25 % IJ SOLN
INTRAMUSCULAR | Status: AC
  Filled 2011-09-26: qty 30

## 2011-09-26 NOTE — Progress Notes (Signed)
Patient ID: Brittney [name], female   DOB: 02/06/1932, 79 y.o.   MRN: [id number] Subjective:  No chest pain. Returned to NSR.  Objective:  Vital Signs in the last 24 hours: Temp:  [97.3 F (36.3 C)-99.1 F (37.3 C)] 98.4 F (36.9 C) (01/18 0410) Pulse Rate:  [115-130] 115  (01/17 1650) Resp:  [13-25] 13  (01/18 0700) BP: (89-123)/(45-69) 114/48 mmHg (01/18 0700) SpO2:  [91 %-99 %] 95 % (01/18 0700)  Intake/Output from previous day: 01/17 0701 - 01/18 0700 In: 2153 [P.O.:1080; I.V.:1073] Out: 1775 [Urine:1775] Intake/Output from this shift:    Physical Exam: Well appearing NAD HEENT: Unremarkable Neck:  No JVD, no thyromegally Lymphatics:  No adenopathy Back:  No CVA tenderness Lungs:  Clear with no wheezes. HEART:  Regular rate rhythm, no murmurs, no rubs, no clicks Abd:  Flat, positive bowel sounds, no organomegally, no rebound, no guarding Ext:  2 plus pulses, no edema, no cyanosis, no clubbing Skin:  No rashes no nodules Neuro:  CN II through XII intact, motor grossly intact  Lab Results:  Basename 09/26/11 0525 09/25/11 0535  WBC 14.3* 12.3*  HGB 12.3 12.1  PLT 240 228    Basename 09/25/11 0535 09/24/11 0535  NA 133* 134*  K 3.8 4.2  CL 96 97  CO2 28 28  GLUCOSE 272* 403*  BUN 34* 27*  CREATININE 1.26* 1.07    Basename 09/24/11 1146 09/24/11 0535  TROPONINI <0.30 <0.30   Hepatic Function Panel  Basename 09/23/11 2345  PROT 7.2  ALBUMIN 3.5  AST 22  ALT 21  ALKPHOS 96  BILITOT 1.2  BILIDIR --  IBILI --   No results found for this basename: CHOL in the last 72 hours No results found for this basename: PROTIME in the last 72 hours  Imaging: No results found.  Cardiac Studies: Tele - atrial flutter to NSR Assessment/Plan:  1. Atrial fib/flutter - she has reverted to NSR. Will plan to proceed with AV node ablation. I plan to stop heparin and will start Pradaxa for 4 weeks. 2. Bradycardia - she is s/p PPM. Will interogate device prior to AV  node ablation and look at her QRS with RV pacing.  LOS: 3 days    Gregg Taylor 09/26/2011, 8:00 AM     

## 2011-09-26 NOTE — Discharge Summary (Signed)
CARDIOLOGY DISCHARGE SUMMARY   Patient ID: Brittney [name] MRN: [id number] DOB/AGE: November 14, 1931 78 y.o.  Admit date: 09/23/2011 Discharge date: 09/26/2011  Primary Discharge Diagnosis:  Atrial fib/flutter Secondary Discharge Diagnosis:  Patient Active Problem List  Diagnoses  . Hypertension  . Dyslipidemia  . CAD (coronary artery disease)  . Ejection fraction  . Pacemaker  . Diabetes mellitus  . Dementia  . GERD (gastroesophageal reflux disease)  . TIA (transient ischemic attack)  . Hypothyroidism  . Carotid artery disease  . PAD (peripheral artery disease)  . CKD (chronic kidney disease)  . Gait disorder  . Vertebrobasilar insufficiency    S/p [employer] RF DR model [id number] (serial number [id number]) dual-chamber pacemaker. September 2010  . Hyperkalemia  . Acute renal insufficiency  . Blindness of right eye  . Respiratory failure  . Orthostatic hypotension  . Ventricular tachycardia  . Drug therapy,  Sotolol  . Atrial fibrillation  . Atrial flutter   Procedures: AV node ablation  Hospital Course: Brittney [name] is a 78 year old female with a history of atrial fibrillation. Prior to admission she had not been on Coumadin per her preference. She also had a previous pacemaker placed for severe sinus bradycardia.   She went to [facility] with chest pain that awakened her on 09/23/2011. She was in heart failure and was transported to [facility]. During transport she went into atrial fibrillation with rapid ventricular response. She was admitted to step down.   She was started on heparin for anticoagulation. She was given IV Lasix for diuresis and her respiratory status improved. She was started on IV Cardizem for rate control but she continued to have a rapid rate. She has a previous history of tachycardia-induced cardiomyopathy and Dr. Ladona Ridgel reviewed all the data. She previously had problems with hypotension on beta blockers and a pacemaker was already in  place. He recommended AV node ablation if she did not convert to sinus rhythm.  Dr. Ladona Ridgel attempted to pace the patient back into sinus rhythm but this was unsuccessful. As she had been refractory to rate control, AV node ablation was felt to be the best option. This was scheduled for 09/26/2011. Prior to the procedure she returned to sinus rhythm but Dr. Ladona Ridgel felt that with her recurrent atrial fibrillation and atrial flutter AV node ablation was still the best option. This was performed on 09/26/2011.  If she remains stable overnight, she will be discharged home on 09/27/2011 with followup arranged.    Labs:   Lab Results  Component Value Date   WBC 14.3* 09/26/2011   HGB 12.3 09/26/2011   HCT 36.7 09/26/2011   MCV 89.5 09/26/2011   PLT 240 09/26/2011    Lab 09/25/11 0535 09/23/11 2345  NA 133* --  K 3.8 --  CL 96 --  CO2 28 --  BUN 34* --  CREATININE 1.26* --  CALCIUM 9.3 --  PROT -- 7.2  BILITOT -- 1.2  ALKPHOS -- 96  ALT -- 21  AST -- 22  GLUCOSE 272* --    Basename 09/24/11 1146 09/24/11 0535 09/23/11 2343  CKTOTAL 38 40 42  CKMB 2.3 2.2 2.1  CKMBINDEX -- -- --  TROPONINI <0.30 <0.30 <0.30   Lipid Panel     Component Value Date/Time   CHOL  Value: 210        ATP III CLASSIFICATION:  <200     mg/dL   Desirable  272-536  mg/dL   Borderline High  >=  240    mg/dL   High       * 1/61/0960 0415   TRIG 228* 10/21/2010 0415   HDL 28* 10/21/2010 0415   CHOLHDL 7.5 10/21/2010 0415   VLDL 46* 10/21/2010 0415   LDLCALC  Value: 136        Total Cholesterol/HDL:CHD Risk Coronary Heart Disease Risk Table                     Men   Women  1/2 Average Risk   3.4   3.3  Average Risk       5.0   4.4  2 X Average Risk   9.6   7.1  3 X Average Risk  23.4   11.0        Use the calculated Patient Ratio above and the CHD Risk Table to determine the patient's CHD Risk.        ATP III CLASSIFICATION (LDL):  <100     mg/dL   Optimal  454-098  mg/dL   Near or Above                    Optimal   130-159  mg/dL   Borderline  119-147  mg/dL   High  >829     mg/dL   Very High* 5/62/1308 0415    No components found with this basename: POCBNP:3     Radiology: 09/23/2011 PORTABLE CHEST - 1 VIEW Comparison: Portable exam 1807 hours compared to 08/26/2011 Findings: Left subclavian sequential transvenous pacemaker leads project at right atrium and right ventricle. Enlargement of cardiac silhouette with pulmonary vascular congestion. Interstitial infiltrates bilaterally compatible with pulmonary edema and CHF. No gross pleural effusion or pneumothorax. Bones demineralized.  IMPRESSION: CHF.  EKG: 25-Sep-2011 06:57:59 Sinus tachycardia with 1st degree A-V block Marked ST abnormality, possible inferior subendocardial injury Abnormal ECG I suspect this is return of atrial flutter or another type of SVT.   FOLLOW UP PLANS AND APPOINTMENTS Discharge Orders    Future Appointments: Provider: Department: Dept Phone: Center:   10/01/2011 1:00 PM [name], MD [facility] [phone] [facility]     Allergies  Allergen Reactions  . Amiodarone     Autonomic dysfunction  . Morphine Sulfate     REACTION: jerking  . Oxycodone Hcl Itching and Other (See Comments)    Feels weird   Current Discharge Medication List    CONTINUE these medications which have NOT CHANGED   Details  acetaminophen (TYLENOL) 500 MG tablet Take 500 mg by mouth every 6 (six) hours as needed. For pain    Ascorbic Acid (VITAMIN C) 1000 MG tablet Take 1,000 mg by mouth daily.      aspirin EC 325 MG EC tablet Take 1 tablet (325 mg total) by mouth daily.    diltiazem (CARDIZEM CD) 120 MG 24 hr capsule Take 1 capsule (120 mg total) by mouth daily. Qty: 30 capsule, Refills: 6    furosemide (LASIX) 40 MG tablet Take 1 tablet (40 mg total) by mouth daily. Qty: 30 tablet, Refills: 6    insulin aspart (NOVOLOG) 100 UNIT/ML injection Inject 5-9 Units into the skin 5 (five) times daily. Per sliding  scale.  90-200: 5 units, 201-250: 6 units, 251-300: 7 units, 301-350: 8 units, 351-400: 9 units    insulin glargine (LANTUS) 100 UNIT/ML injection Inject 15 Units into the skin at bedtime.      levothyroxine (SYNTHROID, LEVOTHROID) 75 MCG tablet Take 75 mcg  by mouth daily.      linagliptin (TRADJENTA) 5 MG TABS tablet Take 5 mg by mouth daily.      midodrine (PROAMATINE) 5 MG tablet Take 5 mg by mouth 2 (two) times daily.      pyridOXINE (VITAMIN B-6) 100 MG tablet Take 100 mg by mouth daily.      sertraline (ZOLOFT) 100 MG tablet Take 100 mg by mouth daily.      sotalol (BETAPACE) 80 MG tablet Take 1 tablet (80 mg total) by mouth daily. Qty: 30 tablet, Refills: 6    travoprost, benzalkonium, (TRAVATAN) 0.004 % ophthalmic solution Place 1 drop into both eyes at bedtime.      nitroGLYCERIN (NITROSTAT) 0.4 MG SL tablet Place 0.4 mg under the tongue every 5 (five) minutes as needed. For chest pain       Follow-up Information    Follow up with [facility], MD in 2 weeks. (As needed)       Follow up with Willa Rough, MD. (January 23rd, at 1pm)    Contact information:   [address], [address] [place], [place] [phone]          BRING ALL MEDICATIONS WITH YOU TO FOLLOW UP APPOINTMENTS  Time spent with patient to include physician time: pending on day of discharge. Signed: Theodore Demark 09/26/2011, 3:30 PM Co-Sign MD  Will DC to home with Pradaxa 150 BID for 4 weeks per Dr. Lubertha Basque rec.  She is stable.  See my note from today.  Vesta Mixer, Montez Hageman., MD, [facility] 09/27/2011, 10:46 AM

## 2011-09-26 NOTE — Progress Notes (Signed)
TO CATH LAB BY BED AWAKE AND ALERT. 

## 2011-09-26 NOTE — Op Note (Signed)
AV node ablation performed without immediate complication. D# [id number].

## 2011-09-26 NOTE — H&P (View-Only) (Signed)
Patient ID: Brittney [name], female   DOB: Dec 14, 1931, 78 y.o.   MRN: [id number] Subjective:  No chest pain. Returned to NSR.  Objective:  Vital Signs in the last 24 hours: Temp:  [97.3 F (36.3 C)-99.1 F (37.3 C)] 98.4 F (36.9 C) (01/18 0410) Pulse Rate:  [115-130] 115  (01/17 1650) Resp:  [13-25] 13  (01/18 0700) BP: (89-123)/(45-69) 114/48 mmHg (01/18 0700) SpO2:  [91 %-99 %] 95 % (01/18 0700)  Intake/Output from previous day: 01/17 0701 - 01/18 0700 In: 2153 [P.O.:1080; I.V.:1073] Out: 1775 [Urine:1775] Intake/Output from this shift:    Physical Exam: Well appearing NAD HEENT: Unremarkable Neck:  No JVD, no thyromegally Lymphatics:  No adenopathy Back:  No CVA tenderness Lungs:  Clear with no wheezes. HEART:  Regular rate rhythm, no murmurs, no rubs, no clicks Abd:  Flat, positive bowel sounds, no organomegally, no rebound, no guarding Ext:  2 plus pulses, no edema, no cyanosis, no clubbing Skin:  No rashes no nodules Neuro:  CN II through XII intact, motor grossly intact  Lab Results:  Basename 09/26/11 0525 09/25/11 0535  WBC 14.3* 12.3*  HGB 12.3 12.1  PLT 240 228    Basename 09/25/11 0535 09/24/11 0535  NA 133* 134*  K 3.8 4.2  CL 96 97  CO2 28 28  GLUCOSE 272* 403*  BUN 34* 27*  CREATININE 1.26* 1.07    Basename 09/24/11 1146 09/24/11 0535  TROPONINI <0.30 <0.30   Hepatic Function Panel  Basename 09/23/11 2345  PROT 7.2  ALBUMIN 3.5  AST 22  ALT 21  ALKPHOS 96  BILITOT 1.2  BILIDIR --  IBILI --   No results found for this basename: CHOL in the last 72 hours No results found for this basename: PROTIME in the last 72 hours  Imaging: No results found.  Cardiac Studies: Tele - atrial flutter to NSR Assessment/Plan:  1. Atrial fib/flutter - she has reverted to NSR. Will plan to proceed with AV node ablation. I plan to stop heparin and will start Pradaxa for 4 weeks. 2. Bradycardia - she is s/p PPM. Will interogate device prior to AV  node ablation and look at her QRS with RV pacing.  LOS: 3 days    Lewayne Bunting 09/26/2011, 8:00 AM

## 2011-09-26 NOTE — Discharge Instructions (Signed)
Call [phone] for problems with the cath/ablation sites.

## 2011-09-26 NOTE — Interval H&P Note (Signed)
History and Physical Interval Note:  09/26/2011 11:54 AM  Brittney [name]  has presented today for surgery, with the diagnosis of 427.31  The various methods of treatment have been discussed with the patient and family. After consideration of risks, benefits and other options for treatment, the patient has consented to  Procedure(s): AV NODE ABLATION as a surgical intervention .  The patients' history has been reviewed, patient examined, no change in status, stable for surgery.  I have reviewed the patients' chart and labs.  Questions were answered to the patient's satisfaction.     Lewayne Bunting

## 2011-09-27 DIAGNOSIS — I4891 Unspecified atrial fibrillation: Secondary | ICD-10-CM

## 2011-09-27 DIAGNOSIS — I442 Atrioventricular block, complete: Secondary | ICD-10-CM

## 2011-09-27 MED ORDER — DABIGATRAN ETEXILATE MESYLATE 150 MG PO CAPS: 150.0000 mg | ORAL_CAPSULE | Freq: Two times a day (BID) | ORAL | Status: DC

## 2011-09-27 MED ORDER — DABIGATRAN ETEXILATE MESYLATE 75 MG PO CAPS: 75.0000 mg | ORAL_CAPSULE | Freq: Two times a day (BID) | ORAL | Status: DC

## 2011-09-27 NOTE — Progress Notes (Signed)
Pt excorted via w/c to hospital entrance awaiting pick up by son for trip home.

## 2011-09-27 NOTE — Progress Notes (Signed)
Subjective:   78 yo with pacer, s/p AV node ablation yesterday.  She has done well and is ready to go home. No complications.  Currently AV pacing.       . bupivacaine      . fentaNYL      . heparin      . midazolam      . DISCONTD: aspirin EC  325 mg Oral Daily  . DISCONTD: furosemide  40 mg Intravenous BID  . DISCONTD: insulin aspart  0-15 Units Subcutaneous TID WC  . DISCONTD: insulin glargine  15 Units Subcutaneous QHS  . DISCONTD: levothyroxine  75 mcg Oral Daily  . DISCONTD: linagliptin  5 mg Oral Daily  . DISCONTD: midodrine  5 mg Oral BID  . DISCONTD: pyridOXINE  100 mg Oral Daily  . DISCONTD: sertraline  100 mg Oral Daily  . DISCONTD: sotalol  80 mg Oral Daily  . DISCONTD: Travoprost (BAK Free)  1 drop Both Eyes QHS  . DISCONTD: vitamin C  1,000 mg Oral Daily      . DISCONTD: sodium chloride 10 mL/hr at 09/25/11 0600  . DISCONTD: sodium chloride    . DISCONTD: nitroGLYCERIN Stopped (09/26/11 2000)  . DISCONTD: nitroGLYCERIN 5 mcg/min (09/25/11 1308)    Objective:  Vital Signs in the last 24 hours: Blood pressure 147/60, pulse 70, temperature 98 F (36.7 C), temperature source Oral, resp. rate 17, height 5\' 7"  (1.702 m), weight 164 lb 0.4 oz (74.4 kg), SpO2 99.00%. Temp:  [98 F (36.7 C)-98.6 F (37 C)] 98 F (36.7 C) (01/19 0801) Pulse Rate:  [66-73] 70  (01/19 0800) Resp:  [11-20] 17  (01/19 0800) BP: (122-147)/(50-89) 147/60 mmHg (01/19 0800) SpO2:  [90 %-100 %] 99 % (01/19 0800)  Intake/Output from previous day: 01/18 0701 - 01/19 0700 In: 356.5 [P.O.:240; I.V.:116.5] Out: 900 [Urine:900] Intake/Output from this shift:    Physical Exam: The patient is alert and oriented x 3.  The mood and affect are normal.   Skin: warm and dry.  Color is normal.    HEENT:   the sclera are nonicteric.  The mucous membranes are moist.  The carotids are 2+ without bruits.  There is no thyromegaly.  There is no JVD.    Lungs: clear.  The chest wall is non  tender.  Pacer in place in left subclavian.  Heart: regular rate with a normal S1 and S2.  There are no murmurs, gallops, or rubs. The PMI is not displaced.     Abdomen: good bowel sounds.  There is no guarding or rebound.  There is no hepatosplenomegaly or tenderness.  There are no masses.   Extremities:  no clubbing, cyanosis, or edema.  The legs are without rashes.  The distal pulses are intact.   Neuro:  Cranial nerves II - XII are intact.  Motor and sensory functions are intact.     Lab Results:  Basename 09/26/11 0525 09/25/11 0535  WBC 14.3* 12.3*  HGB 12.3 12.1  PLT 240 228    Basename 09/25/11 0535  NA 133*  K 3.8  CL 96  CO2 28  GLUCOSE 272*  BUN 34*  CREATININE 1.26*    Basename 09/24/11 1146  TROPONINI <0.30   No results found for this basename: BNP in the last 72 hours Hepatic Function Panel No results found for this basename: PROT,ALBUMIN,AST,ALT,ALKPHOS,BILITOT,BILIDIR,IBILI in the last 72 hours Lab Results  Component Value Date   CHOL  Value: 210  ATP III CLASSIFICATION:  <200     mg/dL   Desirable  409-811  mg/dL   Borderline High  >=914    mg/dL   High       * 7/82/9562   HDL 28* 10/21/2010   LDLCALC  Value: 136        Total Cholesterol/HDL:CHD Risk Coronary Heart Disease Risk Table                     Men   Women  1/2 Average Risk   3.4   3.3  Average Risk       5.0   4.4  2 X Average Risk   9.6   7.1  3 X Average Risk  23.4   11.0        Use the calculated Patient Ratio above and the CHD Risk Table to determine the patient's CHD Risk.        ATP III CLASSIFICATION (LDL):  <100     mg/dL   Optimal  130-865  mg/dL   Near or Above                    Optimal  130-159  mg/dL   Borderline  784-696  mg/dL   High  >295     mg/dL   Very High* 2/84/1324   TRIG 228* 10/21/2010   CHOLHDL 7.5 10/21/2010   No results found for this basename: INR in the last 72 hours  Tele:  AV pacing  Assessment/Plan:   1.  A-Fib/flutter- s/[p AV node ablation.  Ready to go  home. See PA note.  Disposition: home today  Alvia Grove., MD, [facility] 09/27/2011, 10:19 AM LOS: Day 4

## 2011-09-29 NOTE — Op Note (Signed)
NAME:  Brittney [name], CARTA NO.:  MEDICAL RECORD NO.:  [id number]  LOCATION:                                 FACILITY:  PHYSICIAN:  Doylene Canning. Ladona Ridgel, MD    DATE OF BIRTH:  26-May-1932  DATE OF PROCEDURE:  09/26/2011 DATE OF DISCHARGE:                              OPERATIVE REPORT   PROCEDURE PERFORMED:  AV node ablation.  INDICATION:  Uncontrolled atrial fibrillation and flutter.  INTRODUCTION:  The patient is a 78 year old woman who presented to the hospital with recurrent atrial arrhythmias.  Her past medical history is somewhat complicated, but notable in that she is unable to tolerate anticoagulation.  She has been unable to tolerate amiodarone, which maintained her in sinus rhythm, and she has been on sotalol.  Despite sotalol, she has had recurrent episodes of atrial fibrillation and flutter.  She presented to the hospital with rapid atrial fibrillation. She is now referred for catheter ablation of her AV node as rate control had been ineffective.  PROCEDURE:  After informed consent was obtained, the patient was taken to the diagnostic EP lab in a fasting state.  After usual preparation and draping, intravenous fentanyl and midazolam was given for sedation. A 7-French quadripolar ablation catheter was inserted percutaneously into the right femoral vein and advanced into the region of the AV node. The HV interval was 35 msec.  A single RF energy application was subsequently delivered for 90 seconds.  This resulted in immediate termination of AV node conduction and the development of complete heart block.  The patient tolerated this well.  She was observed for approximately 20 minutes after the procedure and had no recurrent AV node conduction.  Her pacemaker was reprogrammed, and she was returned to her room in satisfactory condition.  COMPLICATIONS:  There were no immediate procedure complications.  RESULTS:  This demonstrates successful AV node  ablation in a patient with very difficult to control atrial fibrillation/flutter with a rapid ventricular response.     Doylene Canning. Ladona Ridgel, MD     GWT/MEDQ  D:  09/26/2011  T:  09/27/2011  Job:  [id number]  cc:   Hillis Range, MD

## 2011-10-01 ENCOUNTER — Encounter: Payer: Self-pay | Admitting: Cardiology

## 2011-10-01 ENCOUNTER — Ambulatory Visit (INDEPENDENT_AMBULATORY_CARE_PROVIDER_SITE_OTHER): Payer: Medicare Other | Admitting: Cardiology

## 2011-10-01 VITALS — BP 155/84 | HR 70 | Ht 66.0 in | Wt 166.0 lb

## 2011-10-01 DIAGNOSIS — Z7901 Long term (current) use of anticoagulants: Secondary | ICD-10-CM

## 2011-10-01 DIAGNOSIS — I4891 Unspecified atrial fibrillation: Secondary | ICD-10-CM

## 2011-10-01 NOTE — Progress Notes (Signed)
HPI The patient is now seen post hospitalization. She has a long history of paroxysmal atrial fibrillation and atrial flutter. In the past she had a cardiomyopathy related to this. She had been on amiodarone but this was stopped when she had orthostatic hypotension with a fall. Sotalol at a low dose had been started. Most recently she had return of rapid atrial fibrillation. She was hospitalized with this and it was felt that AV node ablation would be the most appropriate approach. This was done by Dr. Ladona Ridgel. She's been stable since then and she is here for followup. As part of today's evaluation I have reviewed the hospital records including the data discussing the need for AV node ablation and the operative note.  The patient had fallen in the past she was taken off Coumadin. She was hesitant to haven't restarted. With her recent events Pradaxa was started. She is willing to use this. Allergies  Allergen Reactions  . Amiodarone     Autonomic dysfunction  . Morphine Sulfate     REACTION: jerking  . Oxycodone Hcl Itching and Other (See Comments)    Feels weird    Current Outpatient Prescriptions  Medication Sig Dispense Refill  . acetaminophen (TYLENOL) 500 MG tablet Take 500 mg by mouth every 6 (six) hours as needed. For pain      . Ascorbic Acid (VITAMIN C) 1000 MG tablet Take 1,000 mg by mouth daily.        [name] aspirin EC 325 MG EC tablet Take 1 tablet (325 mg total) by mouth daily.      . dabigatran (PRADAXA) 150 MG CAPS Take 1 capsule (150 mg total) by mouth every 12 (twelve) hours.  60 capsule  1  . diltiazem (CARDIZEM CD) 120 MG 24 hr capsule Take 1 capsule (120 mg total) by mouth daily.  30 capsule  6  . furosemide (LASIX) 40 MG tablet Take 1 tablet (40 mg total) by mouth daily.  30 tablet  6  . insulin aspart (NOVOLOG) 100 UNIT/ML injection Inject 5-9 Units into the skin 5 (five) times daily. Per sliding scale.  90-200: 5 units, 201-250: 6 units, 251-300: 7 units, 301-350: 8 units,  351-400: 9 units      . insulin glargine (LANTUS) 100 UNIT/ML injection Inject 15 Units into the skin at bedtime.        [name] levothyroxine (SYNTHROID, LEVOTHROID) 75 MCG tablet Take 75 mcg by mouth daily.        [name] linagliptin (TRADJENTA) 5 MG TABS tablet Take 5 mg by mouth daily.        . midodrine (PROAMATINE) 5 MG tablet Take 5 mg by mouth 2 (two) times daily.        . nitroGLYCERIN (NITROSTAT) 0.4 MG SL tablet Place 0.4 mg under the tongue every 5 (five) minutes as needed. For chest pain      . pyridOXINE (VITAMIN B-6) 100 MG tablet Take 100 mg by mouth daily.        . sertraline (ZOLOFT) 100 MG tablet Take 100 mg by mouth daily.        . sotalol (BETAPACE) 80 MG tablet Take 1 tablet (80 mg total) by mouth daily.  30 tablet  6  . travoprost, benzalkonium, (TRAVATAN) 0.004 % ophthalmic solution Place 1 drop into both eyes at bedtime.          History   Social History  . Marital Status: Widowed    Spouse Name: N/A    Number of  Children: 3  . Years of Education: N/A   Occupational History  . RETIRED    Social History Main Topics  . Smoking status: Never Smoker   . Smokeless tobacco: Never Used  . Alcohol Use: No  . Drug Use: No  . Sexually Active: No   Other Topics Concern  . Not on file   Social History Narrative   Gets regular exercise.    Family History  Problem Relation Age of Onset  . Cancer Brother     Colon cancer  . Aortic aneurysm Mother   . Colon polyps Neg Hx   . Liver disease Neg Hx     And no CRC    Past Medical History  Diagnosis Date  . Hypertension   . Dyslipidemia   . CAD (coronary artery disease)     Non-STEMI August, 2010,,,, 40% left main, 30% LAD, 60% OM 2, 40% RCA, 90% OM1-culprit lesion-too small for intervention           . Ejection fraction     EF 55% ,2011 / EF 30% with tachycardia cardiomyopathy / EF 50% January, 2012  . Diabetes mellitus   . Dementia     ?? Early dementia ?? 2012  . GERD (gastroesophageal reflux disease)   . TIA  (transient ischemic attack)     History of TIAs  . Hypothyroidism   . PAD (peripheral artery disease)     Carotid endarterectomy, right. Stents right peroneal, right superficial, right popliteal arteries, Dr. Edilia Bo  . Amputation     Right midfoot 2010  . CKD (chronic kidney disease)     Creatinine 1.19 at December 2012 discharge  . Gait disorder   . Vertebrobasilar insufficiency   . Depression   . Hyperkalemia     ACE Inhibitor held  . Acute renal insufficiency     Catheterization August 2010  . Blindness of right eye   . Respiratory failure     Hypoxic and hypercapnic, 2011, etiology pneumonia  . Leukocytosis 5/11    Persistent post pneumonia  . Orthostatic hypotension     Syncope January 2012 with facial trauma, amiodarone stopped  . Tachycardia induced cardiomyopathy     Previous EF 30%.  Most recently 55-60% by echo 08/14/11  . Atrial fibrillation 8/10    Coumadin use in the past.  Patient decided August, 2012,That she did not want Coumadin restart  /    Rapid rate; Coumadin started TE vardioversion done 04/30/09; amiodarone, beta blockers during hospitalization 8/10; beta blockers lower blood pressure excessively without heart rate control  . Ventricular tachycardia     20 beats December, 2012, asymptomatic  . Atrial flutter 05/24/09    Hospitalization in [place]; with difficult rate control; TEE cardioversion done EF 30%, secondary to tachycardia, pt did convert back tosinus rhythm; return 10/11, amiodarone restarted 10/11, no longer on it. Recurrence with RVR 08/2011.  . Bradycardia, severe sinus 05/31/09    [facility]; cardioversion, chest compressions, pacemaker placed 05/2009 [facility])  . Anemia in CKD (chronic kidney disease)   . Gastroparesis 3/11    Abnormal GES  . Drug therapy,  Sotolol     Sotolol started 08/2011.    Past Surgical History  Procedure Date  . Total abdominal hysterectomy   . Cholecystectomy   . Carotid endarterectomy   . Amputation  2010    RIGHT MIDFOOT  . Tonsillectomy   . Eye surgeries     Several right  . Pacemaker insertion  SJM Pacemaker implant 9/10  . Pci stent to the right peroneal and right superficial as well as right popliteal arteries     Dr. Edilia Bo    ROS Patient denies fever, chills, headache, sweats, rash, change in vision, change in hearing, chest pain, cough, nausea vomiting, urinary symptoms. All other systems are reviewed and are negative.  PHYSICAL EXAM  Patient is stable. She is oriented to person time and place. Affect is normal. Lungs are clear. Respiratory effort is nonlabored. There is no jugulovenous distention. Cardiac exam reveals S1 and S2. The rhythm is regular. The abdomen is soft. There is no peripheral edema. There no musculoskeletal deformities. There are no skin rashes.  Filed Vitals:   10/01/11 1317  BP: 155/84  Pulse: 70  Height: 5\' 6"  (1.676 m)  Weight: 166 lb (75.297 kg)    EKG  EKG is done today and reviewed by me. The rhythm is paced. I cannot be sure about her underlying rhythm.  ASSESSMENT & PLAN Are

## 2011-10-01 NOTE — Assessment & Plan Note (Signed)
She has had a complicated course with atrial fib and atrial flutter. Most recently she had rapid atrial fibrillation and she received AV nodal ablation. She was continued on low-dose sotalol. It would seem that this can hold sinus rhythm most the time this would optimize her pacer function. We will decide over time if she should remain on sotalol.

## 2011-10-01 NOTE — Assessment & Plan Note (Signed)
Coumadin had been stopped when the patient fell. She was hesitant to restart it. Most recently in January, 2013, Pradaxa was started.

## 2011-10-01 NOTE — Patient Instructions (Signed)
Follow up as scheduled with Drs. Allred and Myrtis Ser. Your physician recommends that you continue on your current medications as directed. Please refer to the Current Medication list given to you today.

## 2011-10-17 ENCOUNTER — Encounter: Payer: Self-pay | Admitting: Internal Medicine

## 2011-10-17 ENCOUNTER — Ambulatory Visit (INDEPENDENT_AMBULATORY_CARE_PROVIDER_SITE_OTHER): Payer: Medicare Other | Admitting: Internal Medicine

## 2011-10-17 DIAGNOSIS — I442 Atrioventricular block, complete: Secondary | ICD-10-CM

## 2011-10-17 DIAGNOSIS — Z950 Presence of cardiac pacemaker: Secondary | ICD-10-CM

## 2011-10-17 DIAGNOSIS — I4891 Unspecified atrial fibrillation: Secondary | ICD-10-CM

## 2011-10-17 LAB — PACEMAKER DEVICE OBSERVATION
AL AMPLITUDE: 1.5 mv
AL IMPEDENCE PM: 362.5 Ohm
ATRIAL PACING PM: 87
RV LEAD IMPEDENCE PM: 637.5 Ohm
RV LEAD THRESHOLD: 0.75 V

## 2011-10-17 NOTE — Assessment & Plan Note (Signed)
Paroxysmal afib Maintaining sinus with sotalol  (qt stable)  s/p AV nodal ablation  Continue pradaxa (crcl 44) and stop ASA

## 2011-10-17 NOTE — Assessment & Plan Note (Signed)
Normal pacemaker function See [facility] report Decrease lower rate to 60 bpm today

## 2011-10-17 NOTE — Patient Instructions (Addendum)
Your physician wants you to follow-up in: 6 months with [facility] device clinic.  You will receive a reminder letter in the mail two months in advance. If you don't receive a letter, please call our office to schedule the follow-up appointment. Stop aspirin

## 2011-10-17 NOTE — Progress Notes (Signed)
PCP:  [name], MD, MD Primary Cardiologist:  Myrtis Ser  The patient presents today for routine electrophysiology followup.  Since her recent AV nodal ablation, the patient reports doing reasonbly well.  Her energy continues to gradually improve.  She is very pleased with resolution of palpitations.  The patient feels that she is tolerating medications without difficulties and is otherwise without complaint today.   Past Medical History  Diagnosis Date  . Hypertension   . Dyslipidemia   . CAD (coronary artery disease)     Non-STEMI August, 2010,,,, 40% left main, 30% LAD, 60% OM 2, 40% RCA, 90% OM1-culprit lesion-too small for intervention           . Ejection fraction     EF 55% ,2011 / EF 30% with tachycardia cardiomyopathy / EF 50% January, 2012  . Diabetes mellitus   . Dementia     ?? Early dementia ?? 2012  . GERD (gastroesophageal reflux disease)   . TIA (transient ischemic attack)     History of TIAs  . Hypothyroidism   . PAD (peripheral artery disease)     Carotid endarterectomy, right. Stents right peroneal, right superficial, right popliteal arteries, Dr. Edilia Bo  . Amputation     Right midfoot 2010  . CKD (chronic kidney disease)     Creatinine 1.19 at December 2012 discharge  . Gait disorder   . Vertebrobasilar insufficiency   . Depression   . Hyperkalemia     ACE Inhibitor held  . Acute renal insufficiency     Catheterization August 2010  . Blindness of right eye   . Respiratory failure     Hypoxic and hypercapnic, 2011, etiology pneumonia  . Orthostatic hypotension     Syncope January 2012 with facial trauma, amiodarone stopped  . Tachycardia induced cardiomyopathy     Previous EF 30%.  Most recently 55-60% by echo 08/14/11  . Atrial fibrillation 8/10    on pradaxa, s/p AV nodal ablation  . Ventricular tachycardia     20 beats December, 2012, asymptomatic  . Atrial flutter 05/24/09    Hospitalization in [place]; with difficult rate control; TEE  cardioversion done EF 30%, secondary to tachycardia, pt did convert back tosinus rhythm; return 10/11, amiodarone restarted 10/11, no longer on it. Recurrence with RVR 08/2011.  [name] Anemia in CKD (chronic kidney disease)   . Gastroparesis 3/11    Abnormal GES  . Drug therapy,  Sotolol     Sotolol started 08/2011.  . Drug therapy     Pradaxa January, 2013  . Complete heart block     s/p AV nodal ablation   Past Surgical History  Procedure Date  . Total abdominal hysterectomy   . Cholecystectomy   . Carotid endarterectomy   . Amputation 2010    RIGHT MIDFOOT  . Tonsillectomy   . Eye surgeries     Several right  . Pacemaker insertion     SJM Pacemaker implant 9/10  . Pci stent to the right peroneal and right superficial as well as right popliteal arteries     Dr. Edilia Bo  . Av nodal ablation 08/2011    by Dr Ladona Ridgel    Current Outpatient Prescriptions  Medication Sig Dispense Refill  . acetaminophen (TYLENOL) 500 MG tablet Take 500 mg by mouth every 6 (six) hours as needed. For pain      . Ascorbic Acid (VITAMIN C) 1000 MG tablet Take 1,000 mg by mouth daily.        . dabigatran (PRADAXA)  150 MG CAPS Take 1 capsule (150 mg total) by mouth every 12 (twelve) hours.  60 capsule  1  . diltiazem (CARDIZEM CD) 120 MG 24 hr capsule Take 1 capsule (120 mg total) by mouth daily.  30 capsule  6  . furosemide (LASIX) 40 MG tablet Take 1 tablet (40 mg total) by mouth daily.  30 tablet  6  . insulin aspart (NOVOLOG) 100 UNIT/ML injection Inject 5-9 Units into the skin 5 (five) times daily. Per sliding scale.  90-200: 5 units, 201-250: 6 units, 251-300: 7 units, 301-350: 8 units, 351-400: 9 units      . insulin glargine (LANTUS) 100 UNIT/ML injection Inject 15 Units into the skin at bedtime.        [name] levothyroxine (SYNTHROID, LEVOTHROID) 75 MCG tablet Take 75 mcg by mouth daily.        [name] linagliptin (TRADJENTA) 5 MG TABS tablet Take 5 mg by mouth daily.        . midodrine (PROAMATINE) 5 MG tablet  Take 5 mg by mouth 2 (two) times daily.        . nitroGLYCERIN (NITROSTAT) 0.4 MG SL tablet Place 0.4 mg under the tongue every 5 (five) minutes as needed. For chest pain      . pyridOXINE (VITAMIN B-6) 100 MG tablet Take 100 mg by mouth daily.        . sertraline (ZOLOFT) 100 MG tablet Take 100 mg by mouth daily.        . sotalol (BETAPACE) 80 MG tablet Take 1 tablet (80 mg total) by mouth daily.  30 tablet  6  . travoprost, benzalkonium, (TRAVATAN) 0.004 % ophthalmic solution Place 1 drop into both eyes at bedtime.          Allergies  Allergen Reactions  . Amiodarone     Autonomic dysfunction  . Morphine Sulfate     REACTION: jerking  . Oxycodone Hcl Itching and Other (See Comments)    Feels weird    History   Social History  . Marital Status: Widowed    Spouse Name: N/A    Number of Children: 3  . Years of Education: N/A   Occupational History  . RETIRED    Social History Main Topics  . Smoking status: Never Smoker   . Smokeless tobacco: Never Used  . Alcohol Use: No  . Drug Use: No  . Sexually Active: No   Other Topics Concern  . Not on file   Social History Narrative   Gets regular exercise.    Family History  Problem Relation Age of Onset  . Cancer Brother     Colon cancer  . Aortic aneurysm Mother   . Colon polyps Neg Hx   . Liver disease Neg Hx     And no CRC   Physical Exam: Filed Vitals:   10/17/11 0915  BP: 132/72  Pulse: 70  Height: 5\' 6"  (1.676 m)  Weight: 167 lb (75.751 kg)  SpO2: 93%    GEN- The patient is well appearing, alert and oriented x 3 today.   Head- normocephalic, atraumatic Eyes-  Sclera clear, conjunctiva pink Ears- hearing intact Oropharynx- clear Neck- supple, no JVP Lymph- no cervical lymphadenopathy Lungs- Clear to ausculation bilaterally, normal work of breathing Chest- pacemaker pocket is well healed Heart- Regular rate and rhythm (paced) GI- soft, NT, ND, + BS Extremities- no clubbing, cyanosis, or edema  ekg  10/01/11 reveals AV pacing, stable QTc  Pacemaker interrogation- reviewed in detail  today,  See PACEART report  Assessment and Plan:

## 2011-10-22 ENCOUNTER — Encounter: Payer: Medicare Other | Admitting: Internal Medicine

## 2011-10-29 ENCOUNTER — Encounter: Payer: Self-pay | Admitting: Cardiology

## 2011-10-29 ENCOUNTER — Ambulatory Visit (INDEPENDENT_AMBULATORY_CARE_PROVIDER_SITE_OTHER): Payer: Medicare Other | Admitting: Orthopedic Surgery

## 2011-10-29 ENCOUNTER — Encounter: Payer: Self-pay | Admitting: Orthopedic Surgery

## 2011-10-29 VITALS — BP 122/60 | Ht 66.0 in | Wt 167.0 lb

## 2011-10-29 DIAGNOSIS — M719 Bursopathy, unspecified: Secondary | ICD-10-CM

## 2011-10-29 DIAGNOSIS — S43429A Sprain of unspecified rotator cuff capsule, initial encounter: Secondary | ICD-10-CM

## 2011-10-29 DIAGNOSIS — M75102 Unspecified rotator cuff tear or rupture of left shoulder, not specified as traumatic: Secondary | ICD-10-CM

## 2011-10-29 DIAGNOSIS — M7580 Other shoulder lesions, unspecified shoulder: Secondary | ICD-10-CM

## 2011-10-29 NOTE — Progress Notes (Signed)
After the patient's last visit we did document by phone call that she has been on cholesterol medicine in the past but she is not currently on 1. I will address this at her next visit

## 2011-11-01 ENCOUNTER — Encounter: Payer: Self-pay | Admitting: Orthopedic Surgery

## 2011-11-01 DIAGNOSIS — S43429A Sprain of unspecified rotator cuff capsule, initial encounter: Secondary | ICD-10-CM | POA: Insufficient documentation

## 2011-11-01 DIAGNOSIS — M75102 Unspecified rotator cuff tear or rupture of left shoulder, not specified as traumatic: Secondary | ICD-10-CM | POA: Insufficient documentation

## 2011-11-01 DIAGNOSIS — M7580 Other shoulder lesions, unspecified shoulder: Secondary | ICD-10-CM | POA: Insufficient documentation

## 2011-11-01 NOTE — Patient Instructions (Signed)
I think at this point we can start some physical therapy may have a rotator cuff tear but therapy and an injection may be beneficial to relieve pain improve function.

## 2011-11-01 NOTE — Progress Notes (Signed)
Patient ID: Brittney [name], female   DOB: May 10, 1932, 78 y.o.   MRN: [id number] Subacromial Shoulder Injection Procedure Note  Pre-operative Diagnosis: left RC Syndrome  Post-operative Diagnosis: same  Indications: pain   Anesthesia: ethyl chloride   Procedure Details   Verbal consent was obtained for the procedure. The shoulder was prepped withalcohol and the skin was anesthetized. A 20 gauge needle was advanced into the subacromial space through posterior approach without difficulty  The space was then injected with 3 ml 1% lidocaine and 1 ml of depomedrol. The injection site was cleansed with isopropyl alcohol and a dressing was applied.  Complications:  None; patient tolerated the procedure well.

## 2011-11-01 NOTE — Progress Notes (Signed)
  Subjective:    Brittney [name] is a 78 y.o. female who presents with left shoulder pain. The symptoms began several months ago. Aggravating factors: no known event. Pain is located in the anterior glenohumeral region. Discomfort is described as sharp/stabbing. Symptoms are exacerbated by overhead movements and lying on the shoulder. Evaluation to date: none. Therapy to date includes: nothing specific.  The following portions of the patient's history were reviewed and updated as appropriate: allergies, current medications, past family history, past medical history, past social history, past surgical history and problem list.  Review of Systems positive findings revision, eye pain, redness, watering of the eyes  Snoring, shortness of breath, tightness of the chest. Chest pain, palpitations. Heartburn Frequency and urgency Skin poor healing Unsteady gait and numbness Anxiety Easy bleeding Constitutional symptoms, endocrine symptoms and an ALLERGIC symptoms were normal.  Objective:    BP 122/60  Ht 5\' 6"  (1.676 m)  Wt 167 lb (75.751 kg)  BMI 26.95 kg/m2  Vital signs are stable as recorded  General appearance is normal  The patient is alert and oriented x3  The patient's mood and affect are normal  Gait assessment: normal The cardiovascular exam reveals normal pulses and temperature without edema swelling.  The lymphatic system is negative for palpable lymph nodes  The sensory exam is normal.  There are no pathologic reflexes.  Balance is normal.   Exam of the cervical spine revealed normal soft tissues with no tenderness, normal range of motion expected for age.  Normal muscle tone and no instability   Right shoulder: full ROM, motor exam normal and stability tests normal, no tenderness.  No deformity.  Negative impingement sign  Left shoulder: tenderness over the glenohumeral joint, negative for tenderness over the acromioclavicular joint, positive for impingement sign,  crepitus with ROM, sensory exam normal, motor exam normal, radial pulse intact and motor exam was abnormal for external rotation and supraspinatus tendon.  Internal rotation motor exam normal. the shoulder was stable     Assessment:    Left rotator cuff syndrome vs. rotator cuff tear    Plan:    Natural history and expected course discussed. Questions answered. Shoulder injection. See procedure note. Physical therapy referral.

## 2011-12-03 ENCOUNTER — Other Ambulatory Visit: Payer: Self-pay | Admitting: *Deleted

## 2011-12-03 MED ORDER — FUROSEMIDE 40 MG PO TABS: 40.0000 mg | ORAL_TABLET | Freq: Every day | ORAL | Status: DC

## 2011-12-17 ENCOUNTER — Ambulatory Visit (INDEPENDENT_AMBULATORY_CARE_PROVIDER_SITE_OTHER): Payer: Medicare Other | Admitting: Cardiology

## 2011-12-17 ENCOUNTER — Encounter: Payer: Self-pay | Admitting: Cardiology

## 2011-12-17 VITALS — BP 151/75 | HR 71 | Ht 66.0 in | Wt 166.0 lb

## 2011-12-17 DIAGNOSIS — I951 Orthostatic hypotension: Secondary | ICD-10-CM

## 2011-12-17 DIAGNOSIS — I4891 Unspecified atrial fibrillation: Secondary | ICD-10-CM

## 2011-12-17 DIAGNOSIS — I4892 Unspecified atrial flutter: Secondary | ICD-10-CM

## 2011-12-17 NOTE — Assessment & Plan Note (Signed)
The patient is quite stable and her AV nodal ablation this helped her substantially. It seems to me today that she probably does have atrial flutter. She is stable with this. With this in mind it may be most appropriate to stop her sotalol. I will first be in touch with Dr. Johney Frame to see if there is any other reason to leave the sotalol and place.

## 2011-12-17 NOTE — Assessment & Plan Note (Signed)
She's had no recurrent significant orthostatic hypotension. Originally we felt this was possibly related amiodarone and possibly other autonomic dysfunction. She had significant trauma when she had syncope.

## 2011-12-17 NOTE — Progress Notes (Signed)
HPI Patient returns today for followup of her arrhythmias. She looks great. She's not having any chest pain. She does not feel any palpitations. The patient had an AV nodal ablation. We have kept her on very low dose sotalol. The dosing has been very low because of her renal status. It is my understanding that we have been using this to try to continue to maintain sinus even though she has an AV nodal ablation. She is on Pradaxa. She would like to stop this as she feels it makes her cold. I explained that this would not be the case and that it is very important for her.  Allergies  Allergen Reactions  . Amiodarone     Autonomic dysfunction  . Morphine Sulfate     REACTION: jerking  . Oxycodone Hcl Itching and Other (See Comments)    Feels weird    Current Outpatient Prescriptions  Medication Sig Dispense Refill  . acetaminophen (TYLENOL) 500 MG tablet Take 500 mg by mouth every 6 (six) hours as needed. For pain      . Ascorbic Acid (VITAMIN C) 1000 MG tablet Take 1,000 mg by mouth daily.        . dabigatran (PRADAXA) 150 MG CAPS Take 1 capsule (150 mg total) by mouth every 12 (twelve) hours.  60 capsule  1  . diltiazem (CARDIZEM CD) 120 MG 24 hr capsule Take 1 capsule (120 mg total) by mouth daily.  30 capsule  6  . docusate sodium (COLACE) 100 MG capsule Take 100 mg by mouth daily.      . furosemide (LASIX) 40 MG tablet Take 1 tablet (40 mg total) by mouth daily.  30 tablet  6  . insulin aspart (NOVOLOG) 100 UNIT/ML injection Inject 5-9 Units into the skin 5 (five) times daily. Per sliding scale.  90-200: 5 units, 201-250: 6 units, 251-300: 7 units, 301-350: 8 units, 351-400: 9 units      . insulin glargine (LANTUS) 100 UNIT/ML injection Inject 15 Units into the skin at bedtime.        [name] levothyroxine (SYNTHROID, LEVOTHROID) 75 MCG tablet Take 75 mcg by mouth daily.        . nitroGLYCERIN (NITROSTAT) 0.4 MG SL tablet Place 0.4 mg under the tongue every 5 (five) minutes as needed. For  chest pain      . pyridOXINE (VITAMIN B-6) 100 MG tablet Take 100 mg by mouth daily.        . sertraline (ZOLOFT) 100 MG tablet Take 100 mg by mouth daily.        . sotalol (BETAPACE) 80 MG tablet Take 1 tablet (80 mg total) by mouth daily.  30 tablet  6  . travoprost, benzalkonium, (TRAVATAN) 0.004 % ophthalmic solution Place 1 drop into both eyes at bedtime.          History   Social History  . Marital Status: Widowed    Spouse Name: N/A    Number of Children: 3  . Years of Education: N/A   Occupational History  . RETIRED    Social History Main Topics  . Smoking status: Never Smoker   . Smokeless tobacco: Never Used  . Alcohol Use: No  . Drug Use: No  . Sexually Active: No   Other Topics Concern  . Not on file   Social History Narrative   Gets regular exercise.    Family History  Problem Relation Age of Onset  . Cancer Brother  Colon cancer  . Aortic aneurysm Mother   . Colon polyps Neg Hx   . Liver disease Neg Hx     And no CRC    Past Medical History  Diagnosis Date  . Hypertension   . Dyslipidemia   . CAD (coronary artery disease)     Non-STEMI August, 2010,,,, 40% left main, 30% LAD, 60% OM 2, 40% RCA, 90% OM1-culprit lesion-too small for intervention           . Ejection fraction     EF 55% ,2011 / EF 30% with tachycardia cardiomyopathy / EF 50% January, 2012  . Diabetes mellitus   . Dementia     ?? Early dementia ?? 2012  . GERD (gastroesophageal reflux disease)   . TIA (transient ischemic attack)     History of TIAs  . Hypothyroidism   . PAD (peripheral artery disease)     Carotid endarterectomy, right. Stents right peroneal, right superficial, right popliteal arteries, Dr. Edilia Bo  . Amputation     Right midfoot 2010  . CKD (chronic kidney disease)     Creatinine 1.19 at December 2012 discharge  . Gait disorder   . Vertebrobasilar insufficiency   . Depression   . Hyperkalemia     ACE Inhibitor held  . Acute renal insufficiency      Catheterization August 2010  . Blindness of right eye   . Respiratory failure     Hypoxic and hypercapnic, 2011, etiology pneumonia  . Orthostatic hypotension     Syncope January 2012 with facial trauma, amiodarone stopped  . Tachycardia induced cardiomyopathy     Previous EF 30%.  Most recently 55-60% by echo 08/14/11  . Atrial fibrillation 8/10    on pradaxa, s/p AV nodal ablation  . Ventricular tachycardia     20 beats December, 2012, asymptomatic  . Atrial flutter 05/24/09    Hospitalization in [place]; with difficult rate control; TEE cardioversion done EF 30%, secondary to tachycardia, pt did convert back tosinus rhythm; return 10/11, amiodarone restarted 10/11, no longer on it. Recurrence with RVR 08/2011.  [name] Anemia in CKD (chronic kidney disease)   . Gastroparesis 3/11    Abnormal GES  . Drug therapy,  Sotolol     Sotolol started 08/2011.  . Drug therapy     Pradaxa January, 2013  . Complete heart block     s/p AV nodal ablation    Past Surgical History  Procedure Date  . Total abdominal hysterectomy   . Cholecystectomy   . Carotid endarterectomy   . Amputation 2010    RIGHT MIDFOOT  . Tonsillectomy   . Eye surgeries     Several right  . Pacemaker insertion     SJM Pacemaker implant 9/10  . Pci stent to the right peroneal and right superficial as well as right popliteal arteries     Dr. Edilia Bo  . Av nodal ablation 08/2011    by Dr Ladona Ridgel    ROS  Patient denies fever, chills, headache, sweats, rash, change in vision, change in hearing, chest pain, cough, nausea vomiting, urinary symptoms. All other systems are reviewed and are negative.  PHYSICAL EXAM Patient looks quite good. There is no jugular venous distention. Lungs are clear. Respiratory effort is nonlabored. Cardiac exam reveals S1 and S2. There no clicks or significant murmurs. The abdomen is soft. There is no peripheral edema.    Filed Vitals:   12/17/11 1312  BP: 151/75  Pulse: 71  Height: 5'  6" (  1.676 m)  Weight: 166 lb (75.297 kg)    EKG is done today and reviewed by me. There is a paced rhythm. It appears that there is an underlying atrial rhythm that may be atrial flutter.  ASSESSMENT & PLAN

## 2011-12-17 NOTE — Patient Instructions (Signed)
Follow up in 3 months. Your physician recommends that you continue on your current medications as directed. Please refer to the Current Medication list given to you today. 

## 2011-12-27 ENCOUNTER — Encounter (HOSPITAL_COMMUNITY): Payer: Self-pay | Admitting: *Deleted

## 2011-12-27 ENCOUNTER — Emergency Department (HOSPITAL_COMMUNITY)
Admission: EM | Admit: 2011-12-27 | Discharge: 2011-12-27 | Disposition: A | Discharge status: Home / Self Care | Payer: Medicare Other | Attending: Emergency Medicine | Admitting: Emergency Medicine

## 2011-12-27 DIAGNOSIS — I2510 Atherosclerotic heart disease of native coronary artery without angina pectoris: Secondary | ICD-10-CM | POA: Insufficient documentation

## 2011-12-27 DIAGNOSIS — Z79899 Other long term (current) drug therapy: Secondary | ICD-10-CM | POA: Insufficient documentation

## 2011-12-27 DIAGNOSIS — K219 Gastro-esophageal reflux disease without esophagitis: Secondary | ICD-10-CM | POA: Insufficient documentation

## 2011-12-27 DIAGNOSIS — I10 Essential (primary) hypertension: Secondary | ICD-10-CM | POA: Insufficient documentation

## 2011-12-27 DIAGNOSIS — Z8673 Personal history of transient ischemic attack (TIA), and cerebral infarction without residual deficits: Secondary | ICD-10-CM | POA: Insufficient documentation

## 2011-12-27 DIAGNOSIS — E1169 Type 2 diabetes mellitus with other specified complication: Secondary | ICD-10-CM | POA: Insufficient documentation

## 2011-12-27 DIAGNOSIS — E785 Hyperlipidemia, unspecified: Secondary | ICD-10-CM | POA: Insufficient documentation

## 2011-12-27 DIAGNOSIS — E162 Hypoglycemia, unspecified: Secondary | ICD-10-CM

## 2011-12-27 NOTE — ED Notes (Signed)
Patient eating supper tray 

## 2011-12-27 NOTE — ED Notes (Signed)
Pt arrived via ems. pts family states pt was at church today and fell on pavement. Unknown if head injury from fall. Family states pts glucose dropped and pt became light headed and fell.

## 2011-12-27 NOTE — ED Notes (Signed)
BGL 111 

## 2011-12-27 NOTE — Discharge Instructions (Signed)
Low Blood Sugar Low blood sugar (hypoglycemia) means that the level of sugar in your blood is lower than it should be. Signs of low blood sugar include:  Getting sweaty.   Feeling hungry.   Feeling dizzy or weak.   Feeling sleepier than normal.   Feeling nervous.   Headaches.   Having a fast heartbeat.  Low blood sugar can happen fast and can be an emergency. Your doctor can do tests to check your blood sugar level. You can have low blood sugar and not have diabetes. HOME CARE  Check your blood sugar as told by your doctor. If it is less than 70 mg/dl or as told by your doctor, take 1 of the following:   3 to 4 glucose tablets.    cup clear juice.    cup soda pop, not diet.   1 cup milk.   5 to 6 hard candies.   Recheck blood sugar after 15 minutes. Repeat until it is at the right level.   Eat a snack if it is more than 1 hour until the next meal.   Only take medicine as told by your doctor.   Do not skip meals. Eat on time.   Do not drink alcohol except with meals.   Check your blood glucose before driving.   Check your blood glucose before and after exercise.   Always carry treatment with you, such as glucose pills.   Always wear a medical alert bracelet if you have diabetes.  GET HELP RIGHT AWAY IF:   Your blood glucose goes below 70 mg/dl or as told by your doctor, and you:   Are confused.   Are not able to swallow.   Pass out (faint).   You cannot treat yourself. You may need someone to help you.   You have low blood sugar problems often.   You have problems from your medicines.   You are not feeling better after 3 to 4 days.   You have vision changes.  MAKE SURE YOU:   Understand these instructions.   Will watch this condition.   Will get help right away if you are not doing well or get worse.  Document Released: 11/19/2009 Document Revised: 08/14/2011 Document Reviewed: 11/19/2009 [facility] Patient Information 2012 [place],  [place].  Check your sugars frequently. Make sure you're taking appropriate dose of insulin

## 2011-12-29 LAB — GLUCOSE, CAPILLARY

## 2011-12-31 ENCOUNTER — Ambulatory Visit: Payer: Medicare Other | Admitting: Orthopedic Surgery

## 2012-01-07 ENCOUNTER — Encounter: Payer: Self-pay | Admitting: Cardiology

## 2012-01-07 NOTE — Progress Notes (Signed)
   I communicated with Dr. Johney Frame about the patient. After the patient's pacemaker is interrogated again we will see how much of the time she is in atrial fibrillation. If she is in atrial fibrillation most of the time, we will stop sotalol.  Jerral Bonito, MD

## 2012-01-12 ENCOUNTER — Other Ambulatory Visit: Payer: Self-pay | Admitting: *Deleted

## 2012-01-12 MED ORDER — DABIGATRAN ETEXILATE MESYLATE 150 MG PO CAPS: 150.0000 mg | ORAL_CAPSULE | Freq: Two times a day (BID) | ORAL | Status: DC

## 2012-01-16 ENCOUNTER — Other Ambulatory Visit: Payer: Self-pay | Admitting: *Deleted

## 2012-01-16 MED ORDER — DABIGATRAN ETEXILATE MESYLATE 150 MG PO CAPS: 150.0000 mg | ORAL_CAPSULE | Freq: Two times a day (BID) | ORAL | Status: DC

## 2012-01-22 NOTE — ED Provider Notes (Signed)
History     CSN: [id number]  Arrival date & time 12/27/11  4098   First MD Initiated Contact with Patient 12/27/11 1839      Chief Complaint  Patient presents with  . Hypoglycemia    (Consider location/radiation/quality/duration/timing/severity/associated sxs/prior treatment) HPI... blood sugar dropped earlier today the patient was attending church. She fell to the pavement.  No neuro deficits in ED.  Is more alert now. No head or neck trauma. Severity was mild to moderate.    Past Medical History  Diagnosis Date  . Hypertension   . Dyslipidemia   . CAD (coronary artery disease)     Non-STEMI August, 2010,,,, 40% left main, 30% LAD, 60% OM 2, 40% RCA, 90% OM1-culprit lesion-too small for intervention           . Ejection fraction     EF 55% ,2011 / EF 30% with tachycardia cardiomyopathy / EF 50% January, 2012  . Diabetes mellitus   . Dementia     ?? Early dementia ?? 2012  . GERD (gastroesophageal reflux disease)   . TIA (transient ischemic attack)     History of TIAs  . Hypothyroidism   . PAD (peripheral artery disease)     Carotid endarterectomy, right. Stents right peroneal, right superficial, right popliteal arteries, Dr. Edilia Bo  . Amputation     Right midfoot 2010  . CKD (chronic kidney disease)     Creatinine 1.19 at December 2012 discharge  . Gait disorder   . Vertebrobasilar insufficiency   . Depression   . Hyperkalemia     ACE Inhibitor held  . Acute renal insufficiency     Catheterization August 2010  . Blindness of right eye   . Respiratory failure     Hypoxic and hypercapnic, 2011, etiology pneumonia  . Orthostatic hypotension     Syncope January 2012 with facial trauma, amiodarone stopped  . Tachycardia induced cardiomyopathy     Previous EF 30%.  Most recently 55-60% by echo 08/14/11  . Atrial fibrillation 8/10    on pradaxa, s/p AV nodal ablation  . Ventricular tachycardia     20 beats December, 2012, asymptomatic  . Atrial flutter 05/24/09   Hospitalization in [place]; with difficult rate control; TEE cardioversion done EF 30%, secondary to tachycardia, pt did convert back tosinus rhythm; return 10/11, amiodarone restarted 10/11, no longer on it. Recurrence with RVR 08/2011.  [name] Anemia in CKD (chronic kidney disease)   . Gastroparesis 3/11    Abnormal GES  . Drug therapy,  Sotolol     Sotolol started 08/2011.  . Drug therapy     Pradaxa January, 2013  . Complete heart block     s/p AV nodal ablation    Past Surgical History  Procedure Date  . Total abdominal hysterectomy   . Cholecystectomy   . Carotid endarterectomy   . Amputation 2010    RIGHT MIDFOOT  . Tonsillectomy   . Eye surgeries     Several right  . Pacemaker insertion     SJM Pacemaker implant 9/10  . Pci stent to the right peroneal and right superficial as well as right popliteal arteries     Dr. Edilia Bo  . Av nodal ablation 08/2011    by Dr Ladona Ridgel    Family History  Problem Relation Age of Onset  . Cancer Brother     Colon cancer  . Aortic aneurysm Mother   . Colon polyps Neg Hx   . Liver disease Neg Hx  And no CRC    History  Substance Use Topics  . Smoking status: Never Smoker   . Smokeless tobacco: Never Used  . Alcohol Use: No    OB History    Grav Para Term Preterm Abortions TAB SAB Ect Mult Living                  Review of Systems  All other systems reviewed and are negative.    Allergies  Amiodarone; Morphine sulfate; and Oxycodone hcl  Home Medications   Current Outpatient Rx  Name Route Sig Dispense Refill  . ACETAMINOPHEN 500 MG PO TABS Oral Take 500 mg by mouth every 6 (six) hours as needed. For pain    . VITAMIN C 1000 MG PO TABS Oral Take 1,000 mg by mouth daily.      [name] DOCUSATE SODIUM 100 MG PO CAPS Oral Take 100 mg by mouth daily.    . FUROSEMIDE 40 MG PO TABS Oral Take 1 tablet (40 mg total) by mouth daily. 30 tablet 6  . INSULIN ASPART 100 UNIT/ML [place] SOLN Subcutaneous Inject 5-9 Units into the skin 5  (five) times daily. Per sliding scale.  90-200: 5 units, 201-250: 6 units, 251-300: 7 units, 301-350: 8 units, 351-400: 9 units    . INSULIN GLARGINE 100 UNIT/ML [place] SOLN Subcutaneous Inject 15 Units into the skin at bedtime.      [name] LEVOTHYROXINE SODIUM 75 MCG PO TABS Oral Take 75 mcg by mouth daily.      [name] VITAMIN B-6 100 MG PO TABS Oral Take 100 mg by mouth daily.      . SERTRALINE HCL 100 MG PO TABS Oral Take 100 mg by mouth daily.      [name] SOTALOL HCL 80 MG PO TABS Oral Take 1 tablet (80 mg total) by mouth daily. 30 tablet 6  . TRAVOPROST 0.004 % OP SOLN Both Eyes Place 1 drop into both eyes at bedtime.      [name] DABIGATRAN ETEXILATE MESYLATE 150 MG PO CAPS Oral Take 1 capsule (150 mg total) by mouth every 12 (twelve) hours. 60 capsule 3  . NITROGLYCERIN 0.4 MG SL SUBL Sublingual Place 0.4 mg under the tongue every 5 (five) minutes as needed. For chest pain      BP 117/74  Pulse 67  Temp(Src) 97.5 F (36.4 C) (Oral)  Resp 18  Ht 5\' 7"  (1.702 m)  Wt 163 lb (73.936 kg)  BMI 25.53 kg/m2  SpO2 88%  Physical Exam  Nursing note and vitals reviewed. Constitutional: She is oriented to person, place, and time. She appears well-developed and well-nourished.  HENT:  Head: Normocephalic and atraumatic.  Eyes: Conjunctivae and EOM are normal. Pupils are equal, round, and reactive to light.  Neck: Normal range of motion. Neck supple.  Cardiovascular: Normal rate and regular rhythm.   Pulmonary/Chest: Effort normal and breath sounds normal.  Abdominal: Soft. Bowel sounds are normal.  Musculoskeletal: Normal range of motion.  Neurological: She is alert and oriented to person, place, and time.  Skin: Skin is warm and dry.  Psychiatric: She has a normal mood and affect.    ED Course  Procedures (including critical care time)  Labs Reviewed  GLUCOSE, CAPILLARY - Abnormal; Notable for the following:    Glucose-Capillary 111 (*)    All other components within normal limits  LAB REPORT - SCANNED     No results found. No results found.  1. Hypoglycemia       MDM  Glucose normalized.  She is back to baseline. Family agrees to take her home.        Donnetta Hutching, MD 01/22/12 [phone]

## 2012-01-24 ENCOUNTER — Encounter (HOSPITAL_COMMUNITY): Payer: Self-pay

## 2012-01-24 ENCOUNTER — Emergency Department (HOSPITAL_COMMUNITY): Payer: Medicare Other

## 2012-01-24 ENCOUNTER — Inpatient Hospital Stay (HOSPITAL_COMMUNITY)
Admission: EM | Admit: 2012-01-24 | Discharge: 2012-01-27 | DRG: 292 | Disposition: A | Discharge status: Home / Self Care | Payer: Medicare Other | Attending: Internal Medicine | Admitting: Internal Medicine

## 2012-01-24 DIAGNOSIS — I5033 Acute on chronic diastolic (congestive) heart failure: Principal | ICD-10-CM | POA: Diagnosis present

## 2012-01-24 DIAGNOSIS — Z950 Presence of cardiac pacemaker: Secondary | ICD-10-CM

## 2012-01-24 DIAGNOSIS — E119 Type 2 diabetes mellitus without complications: Secondary | ICD-10-CM | POA: Diagnosis present

## 2012-01-24 DIAGNOSIS — E876 Hypokalemia: Secondary | ICD-10-CM

## 2012-01-24 DIAGNOSIS — K219 Gastro-esophageal reflux disease without esophagitis: Secondary | ICD-10-CM | POA: Diagnosis present

## 2012-01-24 DIAGNOSIS — E039 Hypothyroidism, unspecified: Secondary | ICD-10-CM | POA: Diagnosis present

## 2012-01-24 DIAGNOSIS — I4891 Unspecified atrial fibrillation: Secondary | ICD-10-CM | POA: Diagnosis present

## 2012-01-24 DIAGNOSIS — S98919A Complete traumatic amputation of unspecified foot, level unspecified, initial encounter: Secondary | ICD-10-CM

## 2012-01-24 DIAGNOSIS — Z794 Long term (current) use of insulin: Secondary | ICD-10-CM

## 2012-01-24 DIAGNOSIS — N289 Disorder of kidney and ureter, unspecified: Secondary | ICD-10-CM

## 2012-01-24 DIAGNOSIS — N390 Urinary tract infection, site not specified: Secondary | ICD-10-CM | POA: Diagnosis present

## 2012-01-24 DIAGNOSIS — I509 Heart failure, unspecified: Secondary | ICD-10-CM | POA: Diagnosis present

## 2012-01-24 DIAGNOSIS — I129 Hypertensive chronic kidney disease with stage 1 through stage 4 chronic kidney disease, or unspecified chronic kidney disease: Secondary | ICD-10-CM | POA: Diagnosis present

## 2012-01-24 DIAGNOSIS — Z8673 Personal history of transient ischemic attack (TIA), and cerebral infarction without residual deficits: Secondary | ICD-10-CM

## 2012-01-24 DIAGNOSIS — I10 Essential (primary) hypertension: Secondary | ICD-10-CM | POA: Diagnosis present

## 2012-01-24 DIAGNOSIS — E785 Hyperlipidemia, unspecified: Secondary | ICD-10-CM | POA: Diagnosis present

## 2012-01-24 DIAGNOSIS — R0902 Hypoxemia: Secondary | ICD-10-CM

## 2012-01-24 DIAGNOSIS — I2510 Atherosclerotic heart disease of native coronary artery without angina pectoris: Secondary | ICD-10-CM | POA: Diagnosis present

## 2012-01-24 DIAGNOSIS — N189 Chronic kidney disease, unspecified: Secondary | ICD-10-CM | POA: Diagnosis present

## 2012-01-24 LAB — URINALYSIS, ROUTINE W REFLEX MICROSCOPIC
Bilirubin Urine: NEGATIVE
Glucose, UA: >1000 mg/dL — AB
Ketones, ur: NEGATIVE mg/dL
Nitrite: NEGATIVE
Specific Gravity, Urine: 1.01 (ref 1.005–1.030)
pH: 6 (ref 5.0–8.0)

## 2012-01-24 LAB — CARDIAC PANEL(CRET KIN+CKTOT+MB+TROPI)
CK, MB: 2.2 ng/mL (ref 0.3–4.0)
Relative Index: INVALID (ref 0.0–2.5)
Troponin I: <0.3 ng/mL (ref <0.30)

## 2012-01-24 LAB — CBC
Hemoglobin: 12.8 g/dL (ref 12.0–15.0)
MCH: 29.8 pg (ref 26.0–34.0)
RBC: 4.29 MIL/uL (ref 3.87–5.11)

## 2012-01-24 LAB — COMPREHENSIVE METABOLIC PANEL
AST: 21 U/L (ref 0–37)
Albumin: 3.5 g/dL (ref 3.5–5.2)
Alkaline Phosphatase: 104 U/L (ref 39–117)
CO2: 29 mEq/L (ref 19–32)
Chloride: 97 mEq/L (ref 96–112)
Creatinine, Ser: 1.21 mg/dL — ABNORMAL HIGH (ref 0.50–1.10)
GFR calc non Af Amer: 42 mL/min — ABNORMAL LOW (ref >90)
Potassium: 3.1 mEq/L — ABNORMAL LOW (ref 3.5–5.1)
Total Bilirubin: 0.6 mg/dL (ref 0.3–1.2)

## 2012-01-24 LAB — DIFFERENTIAL
Eosinophils Absolute: 0.4 10*3/uL (ref 0.0–0.7)
Lymphs Abs: 4.6 10*3/uL — ABNORMAL HIGH (ref 0.7–4.0)
Monocytes Relative: 8 % (ref 3–12)
Neutro Abs: 6.7 10*3/uL (ref 1.7–7.7)
Neutrophils Relative %: 53 % (ref 43–77)

## 2012-01-24 LAB — URINE MICROSCOPIC-ADD ON

## 2012-01-24 MED ORDER — FUROSEMIDE 10 MG/ML IJ SOLN
40.0000 mg | Freq: Once | INTRAMUSCULAR | Status: AC
  Administered 2012-01-24: 40 mg via INTRAVENOUS
  Filled 2012-01-24: qty 4

## 2012-01-24 MED ORDER — ASPIRIN 81 MG PO CHEW
324.0000 mg | CHEWABLE_TABLET | Freq: Once | ORAL | Status: AC
  Administered 2012-01-24: 324 mg via ORAL
  Filled 2012-01-24: qty 4

## 2012-01-24 NOTE — ED Provider Notes (Signed)
History  This chart was scribed for Brittney Octave, MD by Brittney [name]. The patient was seen in room [facility]. Patient's care was started at 2039.  CSN: [id number]  Arrival date & time 01/24/12  2039   First MD Initiated Contact with Patient 01/24/12 2102      Chief Complaint  Patient presents with  . Shortness of Breath    (Consider location/radiation/quality/duration/timing/severity/associated sxs/prior treatment) HPI  Brittney [name] is a 78 y.o. female with a h/o HTN, dyslipidemia, CAD, diabetes, TIA, PAD, atrial fibrillation, cardiomyopathy, and other conditions who presents to the Emergency Department complaining of 1 week of gradually worsening, constant, moderate SOB. Pt reports that symptoms are worse when walking, and she is unable to walk very far. Pt states that SOB has been getting worse over the past 3 days and her SpO2 was 90 in triage. Pt has no h/o COPD or asthma. Pt has been seen in the ED and admitted to the hospital many times for heart related issues. Pt denies fever, abdominal pain, swelling of the legs, and vomiting. Pt denies smoking and alcohol use. PCP - Dr. Felecia Shelling    Past Medical History  Diagnosis Date  . Hypertension   . Dyslipidemia   . CAD (coronary artery disease)     Non-STEMI August, 2010,,,, 40% left main, 30% LAD, 60% OM 2, 40% RCA, 90% OM1-culprit lesion-too small for intervention           . Ejection fraction     EF 55% ,2011 / EF 30% with tachycardia cardiomyopathy / EF 50% January, 2012  . Diabetes mellitus   . Dementia     ?? Early dementia ?? 2012  . GERD (gastroesophageal reflux disease)   . TIA (transient ischemic attack)     History of TIAs  . Hypothyroidism   . PAD (peripheral artery disease)     Carotid endarterectomy, right. Stents right peroneal, right superficial, right popliteal arteries, Dr. Edilia Bo  . Amputation     Right midfoot 2010  . CKD (chronic kidney disease)     Creatinine 1.19 at December 2012 discharge  .  Gait disorder   . Vertebrobasilar insufficiency   . Depression   . Hyperkalemia     ACE Inhibitor held  . Acute renal insufficiency     Catheterization August 2010  . Blindness of right eye   . Respiratory failure     Hypoxic and hypercapnic, 2011, etiology pneumonia  . Orthostatic hypotension     Syncope January 2012 with facial trauma, amiodarone stopped  . Tachycardia induced cardiomyopathy     Previous EF 30%.  Most recently 55-60% by echo 08/14/11  . Atrial fibrillation 8/10    on pradaxa, s/p AV nodal ablation  . Ventricular tachycardia     20 beats December, 2012, asymptomatic  . Atrial flutter 05/24/09    Hospitalization in [place]; with difficult rate control; TEE cardioversion done EF 30%, secondary to tachycardia, pt did convert back tosinus rhythm; return 10/11, amiodarone restarted 10/11, no longer on it. Recurrence with RVR 08/2011.  [name] Anemia in CKD (chronic kidney disease)   . Gastroparesis 3/11    Abnormal GES  . Drug therapy,  Sotolol     Sotolol started 08/2011.  . Drug therapy     Pradaxa January, 2013  . Complete heart block     s/p AV nodal ablation  . CHF (congestive heart failure)     Past Surgical History  Procedure Date  . Total abdominal hysterectomy   .  Cholecystectomy   . Carotid endarterectomy   . Amputation 2010    RIGHT MIDFOOT  . Tonsillectomy   . Eye surgeries     Several right  . Pacemaker insertion     SJM Pacemaker implant 9/10  . Pci stent to the right peroneal and right superficial as well as right popliteal arteries     Dr. Edilia Bo  . Av nodal ablation 08/2011    by Dr Ladona Ridgel    Family History  Problem Relation Age of Onset  . Cancer Brother     Colon cancer  . Aortic aneurysm Mother   . Colon polyps Neg Hx   . Liver disease Neg Hx     And no CRC    History  Substance Use Topics  . Smoking status: Never Smoker   . Smokeless tobacco: Never Used  . Alcohol Use: No    OB History    Grav Para Term Preterm  Abortions TAB SAB Ect Mult Living                  Review of Systems  Constitutional: Negative for fever and chills.  HENT: Negative for ear pain and neck pain.   Respiratory: Positive for shortness of breath. Negative for cough.   Cardiovascular: Negative for chest pain and leg swelling.  Gastrointestinal: Negative for nausea, vomiting and abdominal pain.  All other systems reviewed and are negative.    Allergies  Amiodarone; Morphine sulfate; and Oxycodone hcl  Home Medications   Current Outpatient Rx  Name Route Sig Dispense Refill  . ACETAMINOPHEN 500 MG PO TABS Oral Take 500 mg by mouth every 6 (six) hours as needed. For pain    . VITAMIN C 1000 MG PO TABS Oral Take 1,000 mg by mouth daily.      [name] DABIGATRAN ETEXILATE MESYLATE 150 MG PO CAPS Oral Take 1 capsule (150 mg total) by mouth every 12 (twelve) hours. 60 capsule 3  . DOCUSATE SODIUM 100 MG PO CAPS Oral Take 100 mg by mouth daily.    . FUROSEMIDE 40 MG PO TABS Oral Take 1 tablet (40 mg total) by mouth daily. 30 tablet 6  . INSULIN ASPART 100 UNIT/ML [place] SOLN Subcutaneous Inject 5-9 Units into the skin 5 (five) times daily. Per sliding scale.  90-200: 5 units, 201-250: 6 units, 251-300: 7 units, 301-350: 8 units, 351-400: 9 units    . INSULIN GLARGINE 100 UNIT/ML [place] SOLN Subcutaneous Inject 15 Units into the skin at bedtime.      [name] LEVOTHYROXINE SODIUM 75 MCG PO TABS Oral Take 75 mcg by mouth daily.      [name] VITAMIN B-6 100 MG PO TABS Oral Take 100 mg by mouth daily.      . SERTRALINE HCL 100 MG PO TABS Oral Take 100 mg by mouth daily.      [name] SOTALOL HCL 80 MG PO TABS Oral Take 1 tablet (80 mg total) by mouth daily. 30 tablet 6  . TRAVOPROST 0.004 % OP SOLN Both Eyes Place 1 drop into both eyes at bedtime.      [name] NITROGLYCERIN 0.4 MG SL SUBL Sublingual Place 0.4 mg under the tongue every 5 (five) minutes as needed. For chest pain      Triage Vitals: BP 174/64  Temp(Src) 98.5 F (36.9 C) (Oral)  Resp 24  Ht 5\' 8"   (1.727 m)  Wt 171 lb (77.565 kg)  BMI 26.00 kg/m2  SpO2 90%  Physical Exam  Nursing note  and vitals reviewed. Constitutional: She is oriented to person, place, and time. She appears well-developed and well-nourished.  HENT:  Head: Normocephalic and atraumatic.       Hard of hearing  Eyes: Conjunctivae are normal. No scleral icterus.  Neck: Normal range of motion. Neck supple.  Cardiovascular: Normal rate and normal heart sounds.        pacemaker  Pulmonary/Chest: No respiratory distress. She has rales (bibasilar rales).  Abdominal: Soft. There is no tenderness.  Musculoskeletal: Normal range of motion. She exhibits no edema (no peripheral edema).       Amputation of right forefoot  Neurological: She is alert and oriented to person, place, and time.  Skin: Skin is warm and dry.  Psychiatric: She has a normal mood and affect. Her behavior is normal.    ED Course  Procedures (including critical care time)  DIAGNOSTIC STUDIES: Oxygen Saturation is 90% on room air, low by my interpretation.    COORDINATION OF CARE: 9:08PM - discussed treatment plan with pt and family. They agree.    Labs Reviewed  PRO B NATRIURETIC PEPTIDE - Abnormal; Notable for the following:    Pro B Natriuretic peptide (BNP) 3488.0 (*)    All other components within normal limits  CBC - Abnormal; Notable for the following:    WBC 12.7 (*)    All other components within normal limits  DIFFERENTIAL - Abnormal; Notable for the following:    Lymphs Abs 4.6 (*)    All other components within normal limits  COMPREHENSIVE METABOLIC PANEL - Abnormal; Notable for the following:    Potassium 3.1 (*)    Glucose, Bld 250 (*)    BUN 28 (*)    Creatinine, Ser 1.21 (*)    GFR calc non Af Amer 42 (*)    GFR calc Af Amer 48 (*)    All other components within normal limits  PROTIME-INR - Abnormal; Notable for the following:    Prothrombin Time 19.6 (*)    INR 1.63 (*)    All other components within normal limits    URINALYSIS, ROUTINE W REFLEX MICROSCOPIC - Abnormal; Notable for the following:    Glucose, UA >1000 (*)    Hgb urine dipstick SMALL (*)    All other components within normal limits  CARDIAC PANEL(CRET KIN+CKTOT+MB+TROPI)  URINE MICROSCOPIC-ADD ON   Dg Chest 2 View  01/24/2012  *RADIOLOGY REPORT*  Clinical Data: Shortness of breath and weakness.  CHEST - 2 VIEW  Comparison: 09/23/2011  Findings: 2242 hours. The cardiopericardial silhouette is enlarged. Diffuse interstitial opacity persists.  There is likely underlying chronic interstitial changes but superimposed interstitial pulmonary edema is a distinct consideration.  Left-sided dual lead permanent pacemaker again noted. Telemetry leads overlie the chest.  IMPRESSION: Cardiomegaly with underlying chronic interstitial coarsening. Superimposed interstitial pulmonary edema is difficult to exclude.  Original Report Authenticated By: ERIC A. MANSELL, M.D.     1. CHF (congestive heart failure)   2. Hypoxia       MDM  History of diastolic heart failure with EF of 65% presenting with increased shortness of breath dyspnea on exertion over the past one week. Associated with moist cough, PND, orthopnea. No chest pain or fever. Denies any change in medications and states compliance.  Interstitial edema on chest x-ray with elevated BNP. Patient given 40 mg of Lasix with 600 mL of clear yellow urine output. On attempted ambulation patient is only able to walk about 10 feet before becoming dyspneic and had to  stop. She desaturated to the mid 70s off of her oxygen.  Given her increased work of breathing, dyspnea on exertion, orthopnea and PND, will need admission for CHF exacerbation and diuresis.    Date: 01/24/2012  Rate: 70  Rhythm: paced  QRS Axis: normal  Intervals: normal  ST/T Wave abnormalities: nonspecific ST/T changes  Conduction Disutrbances:nonspecific intraventricular conduction delay  Narrative Interpretation:   Old EKG Reviewed:  unchanged    I personally performed the services described in this documentation, which was scribed in my presence.  The recorded information has been reviewed and considered.    Brittney Octave, MD 01/25/12 0000

## 2012-01-24 NOTE — ED Notes (Signed)
Shortness of breath worsening over past week.

## 2012-01-24 NOTE — H&P (Signed)
PCP:   [name], MD, MD   Chief Complaint:  Shortness of breath worse for the past 3  HPI: Brittney [name] is an 78 y.o. female.   Elderly Caucasian lady with multiple medical problems including diabetes, hypertension, diastolic heart failure, mild dementia, A. fib on chronic anticoagulation, feels that her abdomen has been gradually swelling for the past one week and she's been getting increasingly short of breath for the past 3 days associated with nonproductive cough. Denies fever, chest pains, nausea vomiting or diarrhea. In the emergency room patient received a dose of intravenous Lasix and is feeling somewhat improved but still short of breath and the hospitalist service was called to assist with management.  She is very hard of hearing and her daughter Brittney [name] gives most of the history  Rewiew of Systems:  The patient denies anorexia, fever, weight loss,, vision loss, decreased hearing, hoarseness, chest pain, syncope, , peripheral edema, balance deficits, hemoptysis, abdominal pain, melena, hematochezia, severe indigestion/heartburn, hematuria, incontinence, genital sores, muscle weakness, suspicious skin lesions, transient blindness, difficulty walking, depression, unusual weight change, abnormal bleeding, enlarged lymph nodes, angioedema, and breast masses.    Past Medical History  Diagnosis Date  . Hypertension   . Dyslipidemia   . CAD (coronary artery disease)     Non-STEMI August, 2010,,,, 40% left main, 30% LAD, 60% OM 2, 40% RCA, 90% OM1-culprit lesion-too small for intervention           . Ejection fraction     EF 55% ,2011 / EF 30% with tachycardia cardiomyopathy / EF 50% January, 2012  . Diabetes mellitus   . Dementia     ?? Early dementia ?? 2012  . GERD (gastroesophageal reflux disease)   . TIA (transient ischemic attack)     History of TIAs  . Hypothyroidism   . PAD (peripheral artery disease)     Carotid endarterectomy, right. Stents right peroneal, right  superficial, right popliteal arteries, Dr. Edilia Bo  . Amputation     Right midfoot 2010  . CKD (chronic kidney disease)     Creatinine 1.19 at December 2012 discharge  . Gait disorder   . Vertebrobasilar insufficiency   . Depression   . Hyperkalemia     ACE Inhibitor held  . Acute renal insufficiency     Catheterization August 2010  . Blindness of right eye   . Respiratory failure     Hypoxic and hypercapnic, 2011, etiology pneumonia  . Orthostatic hypotension     Syncope January 2012 with facial trauma, amiodarone stopped  . Tachycardia induced cardiomyopathy     Previous EF 30%.  Most recently 55-60% by echo 08/14/11  . Atrial fibrillation 8/10    on pradaxa, s/p AV nodal ablation  . Ventricular tachycardia     20 beats December, 2012, asymptomatic  . Atrial flutter 05/24/09    Hospitalization in [place]; with difficult rate control; TEE cardioversion done EF 30%, secondary to tachycardia, pt did convert back tosinus rhythm; return 10/11, amiodarone restarted 10/11, no longer on it. Recurrence with RVR 08/2011.  [name] Anemia in CKD (chronic kidney disease)   . Gastroparesis 3/11    Abnormal GES  . Drug therapy,  Sotolol     Sotolol started 08/2011.  . Drug therapy     Pradaxa January, 2013  . Complete heart block     s/p AV nodal ablation  . CHF (congestive heart failure)     Past Surgical History  Procedure Date  . Total abdominal hysterectomy   .  Cholecystectomy   . Carotid endarterectomy   . Amputation 2010    RIGHT MIDFOOT  . Tonsillectomy   . Eye surgeries     Several right  . Pacemaker insertion     SJM Pacemaker implant 9/10  . Pci stent to the right peroneal and right superficial as well as right popliteal arteries     Dr. Edilia Bo  . Av nodal ablation 08/2011    by Dr Ladona Ridgel    Medications:  HOME MEDS: Prior to Admission medications   Medication Sig Start Date End Date Taking? Authorizing Provider  acetaminophen (TYLENOL) 500 MG tablet Take 500 mg by  mouth every 6 (six) hours as needed. For pain   Yes Historical Provider, MD  Ascorbic Acid (VITAMIN C) 1000 MG tablet Take 1,000 mg by mouth daily.     Yes Historical Provider, MD  dabigatran (PRADAXA) 150 MG CAPS Take 1 capsule (150 mg total) by mouth every 12 (twelve) hours. 01/16/12  Yes [name], MD  docusate sodium (COLACE) 100 MG capsule Take 100 mg by mouth daily.   Yes Historical Provider, MD  furosemide (LASIX) 40 MG tablet Take 1 tablet (40 mg total) by mouth daily. 12/03/11  Yes [name], MD  insulin aspart (NOVOLOG) 100 UNIT/ML injection Inject 5-9 Units into the skin 5 (five) times daily. Per sliding scale.  90-200: 5 units, 201-250: 6 units, 251-300: 7 units, 301-350: 8 units, 351-400: 9 units   Yes Historical Provider, MD  insulin glargine (LANTUS) 100 UNIT/ML injection Inject 15 Units into the skin at bedtime.     Yes Historical Provider, MD  levothyroxine (SYNTHROID, LEVOTHROID) 75 MCG tablet Take 75 mcg by mouth daily.     Yes Historical Provider, MD  pyridOXINE (VITAMIN B-6) 100 MG tablet Take 100 mg by mouth daily.     Yes Historical Provider, MD  sertraline (ZOLOFT) 100 MG tablet Take 100 mg by mouth daily.     Yes Historical Provider, MD  sotalol (BETAPACE) 80 MG tablet Take 1 tablet (80 mg total) by mouth daily. 09/04/11 09/03/12 Yes [name], MD  travoprost, benzalkonium, (TRAVATAN) 0.004 % ophthalmic solution Place 1 drop into both eyes at bedtime.     Yes Historical Provider, MD  nitroGLYCERIN (NITROSTAT) 0.4 MG SL tablet Place 0.4 mg under the tongue every 5 (five) minutes as needed. For chest pain    Historical Provider, MD     Allergies:  Allergies  Allergen Reactions  . Amiodarone     Autonomic dysfunction  . Morphine Sulfate     REACTION: jerking  . Oxycodone Hcl Itching and Other (See Comments)    Feels weird    Social History:   reports that she has never smoked. She has never used smokeless tobacco. She reports that she does not drink  alcohol or use illicit drugs.  Family History: Family History  Problem Relation Age of Onset  . Cancer Brother     Colon cancer  . Aortic aneurysm Mother   . Colon polyps Neg Hx   . Liver disease Neg Hx     And no CRC     Physical Exam: Filed Vitals:   01/24/12 2215 01/24/12 2230 01/24/12 2300 01/24/12 2305  BP:      Pulse: 70 70 70   Temp:      TempSrc:      Resp: 18 24 19    Height:      Weight:      SpO2: 97% 97%  97% 77%   Blood pressure 166/57, pulse 70, temperature 98.5 F (36.9 C), temperature source Oral, resp. rate 19, height 5\' 8"  (1.727 m), weight 77.565 kg (171 lb), SpO2 77.00%.  GEN:  Pleasant elderly Caucasian lady n lying in the stretcher in no acute distress; cooperative with exam PSYCH:  alert and oriented x4; does not appear anxious or depressed; affect is appropriate. HEENT: Mucous membranes pink and anicteric; PERRLA; EOM intact; no cervical lymphadenopathy nor thyromegaly or carotid bruit; no JVD; Breasts:: Not examined CHEST WALL: No tenderness CHEST: Normal respiration, clear to auscultation bilaterally HEART: Regular rate and rhythm; no murmurs rubs or gallops BACK: No kyphosis or scoliosis; no CVA tenderness ABDOMEN: Obese, soft non-tender; no masses, no organomegaly, normal abdominal bowel sounds; no pannus; no intertriginous candida. Rectal Exam: Not done EXTREMITIES: No bone or joint deformity; age-appropriate arthropathy of the hands and knees; no edema; no ulcerations. Genitalia: not examined PULSES: 2+ and symmetric SKIN: Normal hydration no rash or ulceration CNS: Cranial nerves 2-12 grossly intact no focal lateralizing neurologic deficit   Labs & Imaging Results for orders placed during the hospital encounter of 01/24/12 (from the past 48 hour(s))  PRO B NATRIURETIC PEPTIDE     Status: Abnormal   Collection Time   01/24/12  9:34 PM      Component Value Range Comment   Pro B Natriuretic peptide (BNP) 3488.0 (*) 0 - 450 (pg/mL)   CBC      Status: Abnormal   Collection Time   01/24/12  9:34 PM      Component Value Range Comment   WBC 12.7 (*) 4.0 - 10.5 (K/uL)    RBC 4.29  3.87 - 5.11 (MIL/uL)    Hemoglobin 12.8  12.0 - 15.0 (g/dL)    HCT 04.5  40.9 - 81.1 (%)    MCV 91.8  78.0 - 100.0 (fL)    MCH 29.8  26.0 - 34.0 (pg)    MCHC 32.5  30.0 - 36.0 (g/dL)    RDW 91.4  78.2 - 95.6 (%)    Platelets 264  150 - 400 (K/uL)   DIFFERENTIAL     Status: Abnormal   Collection Time   01/24/12  9:34 PM      Component Value Range Comment   Neutrophils Relative 53  43 - 77 (%)    Neutro Abs 6.7  1.7 - 7.7 (K/uL)    Lymphocytes Relative 36  12 - 46 (%)    Lymphs Abs 4.6 (*) 0.7 - 4.0 (K/uL)    Monocytes Relative 8  3 - 12 (%)    Monocytes Absolute 1.0  0.1 - 1.0 (K/uL)    Eosinophils Relative 3  0 - 5 (%)    Eosinophils Absolute 0.4  0.0 - 0.7 (K/uL)    Basophils Relative 0  0 - 1 (%)    Basophils Absolute 0.0  0.0 - 0.1 (K/uL)   COMPREHENSIVE METABOLIC PANEL     Status: Abnormal   Collection Time   01/24/12  9:34 PM      Component Value Range Comment   Sodium 137  135 - 145 (mEq/L)    Potassium 3.1 (*) 3.5 - 5.1 (mEq/L)    Chloride 97  96 - 112 (mEq/L)    CO2 29  19 - 32 (mEq/L)    Glucose, Bld 250 (*) 70 - 99 (mg/dL)    BUN 28 (*) 6 - 23 (mg/dL)    Creatinine, Ser 2.13 (*) 0.50 - 1.10 (mg/dL)  Calcium 9.9  8.4 - 10.5 (mg/dL)    Total Protein 7.8  6.0 - 8.3 (g/dL)    Albumin 3.5  3.5 - 5.2 (g/dL)    AST 21  0 - 37 (U/L)    ALT 21  0 - 35 (U/L)    Alkaline Phosphatase 104  39 - 117 (U/L)    Total Bilirubin 0.6  0.3 - 1.2 (mg/dL)    GFR calc non Af Amer 42 (*) >90 (mL/min)    GFR calc Af Amer 48 (*) >90 (mL/min)   PROTIME-INR     Status: Abnormal   Collection Time   01/24/12  9:34 PM      Component Value Range Comment   Prothrombin Time 19.6 (*) 11.6 - 15.2 (seconds)    INR 1.63 (*) 0.00 - 1.49    CARDIAC PANEL(CRET KIN+CKTOT+MB+TROPI)     Status: Normal   Collection Time   01/24/12  9:34 PM      Component Value Range  Comment   Total CK 58  7 - 177 (U/L)    CK, MB 2.2  0.3 - 4.0 (ng/mL)    Troponin I <0.30  <0.30 (ng/mL)    Relative Index RELATIVE INDEX IS INVALID  0.0 - 2.5    URINALYSIS, ROUTINE W REFLEX MICROSCOPIC     Status: Abnormal   Collection Time   01/24/12  9:56 PM      Component Value Range Comment   Color, Urine YELLOW  YELLOW     APPearance CLEAR  CLEAR     Specific Gravity, Urine 1.010  1.005 - 1.030     pH 6.0  5.0 - 8.0     Glucose, UA >1000 (*) NEGATIVE (mg/dL)    Hgb urine dipstick SMALL (*) NEGATIVE     Bilirubin Urine NEGATIVE  NEGATIVE     Ketones, ur NEGATIVE  NEGATIVE (mg/dL)    Protein, ur NEGATIVE  NEGATIVE (mg/dL)    Urobilinogen, UA 0.2  0.0 - 1.0 (mg/dL)    Nitrite NEGATIVE  NEGATIVE     Leukocytes, UA NEGATIVE  NEGATIVE    URINE MICROSCOPIC-ADD ON     Status: Normal   Collection Time   01/24/12  9:56 PM      Component Value Range Comment   Squamous Epithelial / LPF RARE  RARE     WBC, UA 3-6  <3 (WBC/hpf)    RBC / HPF 0-2  <3 (RBC/hpf)    Bacteria, UA RARE  RARE     Dg Chest 2 View  01/24/2012  *RADIOLOGY REPORT*  Clinical Data: Shortness of breath and weakness.  CHEST - 2 VIEW  Comparison: 09/23/2011  Findings: 2242 hours. The cardiopericardial silhouette is enlarged. Diffuse interstitial opacity persists.  There is likely underlying chronic interstitial changes but superimposed interstitial pulmonary edema is a distinct consideration.  Left-sided dual lead permanent pacemaker again noted. Telemetry leads overlie the chest.  IMPRESSION: Cardiomegaly with underlying chronic interstitial coarsening. Superimposed interstitial pulmonary edema is difficult to exclude.  Original Report Authenticated By: ERIC A. MANSELL, M.D.      Assessment Present on Admission:  .CHF (congestive heart failure) .Hypokalemia   .Hypertension .Dyslipidemia .CAD (coronary artery disease) .Diabetes mellitus .GERD (gastroesophageal reflux disease) .Hypothyroidism .CKD (chronic  kidney disease)    PLAN: Admit this lady for treatment of decompensated diastolic heart failure, with intravenous Lasix, continue her ARB and Sotolol.  Replete potassium  Continue management of chronic medical conditions   Other plans as per orders.  Aasiyah Auerbach 01/24/2012, 11:46 PM  Addendum   Patient developed fever and wheezing about 5 hours after admission, blood cultures were ordered, she was started on empiric antibiotics, and serial nebs.   Vania Rea MD 01/25/2012  5:15 AM

## 2012-01-25 ENCOUNTER — Encounter (HOSPITAL_COMMUNITY): Payer: Self-pay | Admitting: Internal Medicine

## 2012-01-25 ENCOUNTER — Other Ambulatory Visit: Payer: Self-pay

## 2012-01-25 DIAGNOSIS — E876 Hypokalemia: Secondary | ICD-10-CM | POA: Diagnosis present

## 2012-01-25 DIAGNOSIS — I509 Heart failure, unspecified: Secondary | ICD-10-CM | POA: Diagnosis present

## 2012-01-25 LAB — CARDIAC PANEL(CRET KIN+CKTOT+MB+TROPI)
CK, MB: 2 ng/mL (ref 0.3–4.0)
Relative Index: INVALID (ref 0.0–2.5)
Relative Index: INVALID (ref 0.0–2.5)
Total CK: 44 U/L (ref 7–177)
Total CK: 33 U/L (ref 7–177)
Troponin I: <0.3 ng/mL (ref <0.30)
Troponin I: <0.3 ng/mL (ref <0.30)

## 2012-01-25 LAB — GLUCOSE, CAPILLARY: Glucose-Capillary: 264 mg/dL — ABNORMAL HIGH (ref 70–99)

## 2012-01-25 MED ORDER — LEVOTHYROXINE SODIUM 75 MCG PO TABS
75.0000 ug | ORAL_TABLET | Freq: Every day | ORAL | Status: DC
  Administered 2012-01-25 – 2012-01-27 (×3): 75 ug via ORAL
  Filled 2012-01-25 (×3): qty 1

## 2012-01-25 MED ORDER — DOCUSATE SODIUM 100 MG PO CAPS
100.0000 mg | ORAL_CAPSULE | Freq: Every day | ORAL | Status: DC
  Administered 2012-01-25 – 2012-01-27 (×3): 100 mg via ORAL
  Filled 2012-01-25 (×3): qty 1

## 2012-01-25 MED ORDER — TRAVOPROST (BAK FREE) 0.004 % OP SOLN
1.0000 [drp] | Freq: Every day | OPHTHALMIC | Status: DC
  Administered 2012-01-25 – 2012-01-26 (×2): 1 [drp] via OPHTHALMIC
  Filled 2012-01-25: qty 2.5

## 2012-01-25 MED ORDER — ONDANSETRON HCL 4 MG/2ML IJ SOLN: 4.0000 mg | INTRAMUSCULAR | Status: DC | PRN

## 2012-01-25 MED ORDER — FUROSEMIDE 10 MG/ML IJ SOLN
40.0000 mg | Freq: Two times a day (BID) | INTRAMUSCULAR | Status: DC
  Administered 2012-01-25 (×2): 40 mg via INTRAVENOUS
  Filled 2012-01-25 (×2): qty 4

## 2012-01-25 MED ORDER — INSULIN ASPART 100 UNIT/ML ~~LOC~~ SOLN
0.0000 [IU] | Freq: Three times a day (TID) | SUBCUTANEOUS | Status: DC
  Administered 2012-01-25: 7 [IU] via SUBCUTANEOUS
  Administered 2012-01-25: 15 [IU] via SUBCUTANEOUS
  Administered 2012-01-25: 5 [IU] via SUBCUTANEOUS
  Administered 2012-01-26 (×2): 9 [IU] via SUBCUTANEOUS
  Administered 2012-01-26 – 2012-01-27 (×2): 3 [IU] via SUBCUTANEOUS
  Administered 2012-01-27: 2 [IU] via SUBCUTANEOUS

## 2012-01-25 MED ORDER — INSULIN ASPART 100 UNIT/ML ~~LOC~~ SOLN
0.0000 [IU] | Freq: Every day | SUBCUTANEOUS | Status: DC
Start: 2012-01-25 — End: 2012-01-27
  Administered 2012-01-25: 2 [IU] via SUBCUTANEOUS
  Administered 2012-01-26: 5 [IU] via SUBCUTANEOUS

## 2012-01-25 MED ORDER — DABIGATRAN ETEXILATE MESYLATE 150 MG PO CAPS
150.0000 mg | ORAL_CAPSULE | Freq: Two times a day (BID) | ORAL | Status: DC
  Administered 2012-01-25 – 2012-01-27 (×5): 150 mg via ORAL
  Filled 2012-01-25 (×7): qty 1

## 2012-01-25 MED ORDER — SODIUM CHLORIDE 0.9 % IJ SOLN
3.0000 mL | Freq: Two times a day (BID) | INTRAMUSCULAR | Status: DC
  Administered 2012-01-25 – 2012-01-26 (×2): 3 mL via INTRAVENOUS
  Filled 2012-01-25 (×3): qty 3

## 2012-01-25 MED ORDER — POTASSIUM CHLORIDE CRYS ER 20 MEQ PO TBCR
60.0000 meq | EXTENDED_RELEASE_TABLET | Freq: Once | ORAL | Status: AC
  Administered 2012-01-25: 60 meq via ORAL
  Filled 2012-01-25: qty 3

## 2012-01-25 MED ORDER — VITAMIN B-6 50 MG PO TABS
100.0000 mg | ORAL_TABLET | Freq: Every day | ORAL | Status: DC
Start: 2012-01-25 — End: 2012-01-27
  Administered 2012-01-25 – 2012-01-27 (×3): 100 mg via ORAL
  Filled 2012-01-25 (×3): qty 2

## 2012-01-25 MED ORDER — LEVALBUTEROL HCL 1.25 MG/0.5ML IN NEBU: 1.2500 mg | INHALATION_SOLUTION | Freq: Four times a day (QID) | RESPIRATORY_TRACT | Status: DC | PRN

## 2012-01-25 MED ORDER — SODIUM CHLORIDE 0.9 % IJ SOLN: 3.0000 mL | INTRAMUSCULAR | Status: DC | PRN

## 2012-01-25 MED ORDER — MOXIFLOXACIN HCL IN NACL 400 MG/250ML IV SOLN
400.0000 mg | INTRAVENOUS | Status: DC
  Administered 2012-01-25 – 2012-01-26 (×2): 400 mg via INTRAVENOUS
  Filled 2012-01-25 (×2): qty 250

## 2012-01-25 MED ORDER — POTASSIUM CHLORIDE CRYS ER 20 MEQ PO TBCR
40.0000 meq | EXTENDED_RELEASE_TABLET | Freq: Two times a day (BID) | ORAL | Status: DC
  Administered 2012-01-25 – 2012-01-27 (×5): 40 meq via ORAL
  Filled 2012-01-25 (×5): qty 2

## 2012-01-25 MED ORDER — SODIUM CHLORIDE 0.9 % IJ SOLN
INTRAMUSCULAR | Status: AC
  Filled 2012-01-25: qty 3

## 2012-01-25 MED ORDER — ACETAMINOPHEN 500 MG PO TABS
500.0000 mg | ORAL_TABLET | Freq: Four times a day (QID) | ORAL | Status: DC | PRN
  Administered 2012-01-25: 500 mg via ORAL
  Filled 2012-01-25: qty 1

## 2012-01-25 MED ORDER — PANTOPRAZOLE SODIUM 40 MG IV SOLR
40.0000 mg | Freq: Once | INTRAVENOUS | Status: AC
  Administered 2012-01-25: 40 mg via INTRAVENOUS
  Filled 2012-01-25: qty 40

## 2012-01-25 MED ORDER — MOXIFLOXACIN HCL IN NACL 400 MG/250ML IV SOLN
INTRAVENOUS | Status: AC
  Filled 2012-01-25: qty 250

## 2012-01-25 MED ORDER — INSULIN GLARGINE 100 UNIT/ML ~~LOC~~ SOLN
15.0000 [IU] | Freq: Every day | SUBCUTANEOUS | Status: DC
  Administered 2012-01-25: 15 [IU] via SUBCUTANEOUS

## 2012-01-25 MED ORDER — SOTALOL HCL 80 MG PO TABS
80.0000 mg | ORAL_TABLET | Freq: Every day | ORAL | Status: DC
Start: 2012-01-25 — End: 2012-01-27
  Administered 2012-01-25 – 2012-01-27 (×3): 80 mg via ORAL
  Filled 2012-01-25 (×4): qty 1

## 2012-01-25 MED ORDER — ACETAMINOPHEN 325 MG PO TABS
650.0000 mg | ORAL_TABLET | ORAL | Status: DC | PRN
  Administered 2012-01-26 – 2012-01-27 (×2): 650 mg via ORAL
  Filled 2012-01-25 (×2): qty 2

## 2012-01-25 MED ORDER — NITROGLYCERIN 0.4 MG SL SUBL: 0.4000 mg | SUBLINGUAL_TABLET | SUBLINGUAL | Status: DC | PRN

## 2012-01-25 MED ORDER — IPRATROPIUM BROMIDE 0.02 % IN SOLN: 0.5000 mg | Freq: Four times a day (QID) | RESPIRATORY_TRACT | Status: DC | PRN

## 2012-01-25 MED ORDER — SERTRALINE HCL 50 MG PO TABS
100.0000 mg | ORAL_TABLET | Freq: Every day | ORAL | Status: DC
  Administered 2012-01-25 – 2012-01-27 (×3): 100 mg via ORAL
  Filled 2012-01-25 (×3): qty 2

## 2012-01-25 NOTE — Progress Notes (Signed)
Brittney [name], Brittney [name] NO.:  [id number]  MEDICAL RECORD NO.:  [id number]  LOCATION:  A306                          FACILITY:  [facility]  PHYSICIAN:  Ramell Wacha D. Felecia Shelling, MD   DATE OF BIRTH:  11/30/1931  DATE OF PROCEDURE:  01/25/2012 DATE OF DISCHARGE:                                PROGRESS NOTE   SUBJECTIVE:  The patient feels slightly better.  Her breathing is improving.  No fever or chills.  OBJECTIVE:  GENERAL/VITAL SIGNS:  Patient is alert, awake, and sick- looking with vitals, blood pressure 149/65, pulse 69, respiratory rate 18, temperature 98 degrees Fahrenheit. CHEST:  Decreased air entry, bilateral rhonchi. CARDIOVASCULAR SYSTEM:  First and second heart sounds heard.  No murmur, no gallop. ABDOMEN:  Soft and lax.  Bowel sound is positive.  No hepatosplenomegaly. EXTREMITIES:  2+ edema.  ASSESSMENT: 1. Congestive heart failure, etiology unclear. 2. History of atrial fibrillation. 3. Diabetes mellitus, poorly controlled. 4. Coronary artery disease.  PLAN:  We will continue the patient on IV diuretics.  We will do echocardiogram.  We will continue Accu-Chek with sliding scale coverage. We will do Cardiology consult.  Continue supportive treatment.     Tamberlyn Midgley D. Felecia Shelling, MD     TDF/MEDQ  D:  01/25/2012  T:  01/25/2012  Job:  [id number]

## 2012-01-25 NOTE — Progress Notes (Signed)
Patient had a CBG of 423. Notified Dr. Felecia Shelling and he ordered 15 units of insulin. Patient had foley inserted in ED and Dr. Felecia Shelling agreed to left foley remain because of Lasix being given.

## 2012-01-25 NOTE — Progress Notes (Signed)
Called to patient's room. Patient was short of breath with upper airway wheezing.  Oxygen saturationwas 91% on 2 liters oxygen via nasal cannula.  Patient had a temperature of 102.  Notified Dr. Orvan Falconer of patient's condition.  He accessed patient and gave orders.  Will continue to monitor patient.

## 2012-01-26 DIAGNOSIS — N289 Disorder of kidney and ureter, unspecified: Secondary | ICD-10-CM

## 2012-01-26 DIAGNOSIS — I4891 Unspecified atrial fibrillation: Secondary | ICD-10-CM

## 2012-01-26 DIAGNOSIS — I509 Heart failure, unspecified: Secondary | ICD-10-CM

## 2012-01-26 DIAGNOSIS — I059 Rheumatic mitral valve disease, unspecified: Secondary | ICD-10-CM

## 2012-01-26 DIAGNOSIS — I10 Essential (primary) hypertension: Secondary | ICD-10-CM

## 2012-01-26 LAB — BASIC METABOLIC PANEL
Calcium: 9.7 mg/dL (ref 8.4–10.5)
GFR calc non Af Amer: 44 mL/min — ABNORMAL LOW (ref >90)
Glucose, Bld: 250 mg/dL — ABNORMAL HIGH (ref 70–99)
Sodium: 138 mEq/L (ref 135–145)

## 2012-01-26 LAB — TSH: TSH: 2.924 u[IU]/mL (ref 0.350–4.500)

## 2012-01-26 LAB — GLUCOSE, CAPILLARY
Glucose-Capillary: 244 mg/dL — ABNORMAL HIGH (ref 70–99)
Glucose-Capillary: 334 mg/dL — ABNORMAL HIGH (ref 70–99)

## 2012-01-26 LAB — HEMOGLOBIN A1C: Mean Plasma Glucose: 189 mg/dL — ABNORMAL HIGH (ref <117)

## 2012-01-26 MED ORDER — FUROSEMIDE 10 MG/ML IJ SOLN
40.0000 mg | Freq: Once | INTRAMUSCULAR | Status: AC
  Administered 2012-01-26: 40 mg via INTRAVENOUS
  Filled 2012-01-26: qty 4

## 2012-01-26 MED ORDER — MOXIFLOXACIN HCL 400 MG PO TABS
400.0000 mg | ORAL_TABLET | Freq: Every day | ORAL | Status: DC
  Administered 2012-01-27: 400 mg via ORAL
  Filled 2012-01-26: qty 1

## 2012-01-26 MED ORDER — FUROSEMIDE 10 MG/ML IJ SOLN
40.0000 mg | Freq: Every day | INTRAMUSCULAR | Status: DC
  Administered 2012-01-27: 40 mg via INTRAVENOUS
  Filled 2012-01-26: qty 4

## 2012-01-26 MED ORDER — INSULIN GLARGINE 100 UNIT/ML ~~LOC~~ SOLN
20.0000 [IU] | Freq: Every day | SUBCUTANEOUS | Status: DC
  Administered 2012-01-26: 20 [IU] via SUBCUTANEOUS

## 2012-01-26 NOTE — Progress Notes (Signed)
Pt temperature 101.6 at this time. Per Dr. Letitia Neri orders, 650 mg of PO Tylenol administered. Will continue to monitor.

## 2012-01-26 NOTE — Progress Notes (Signed)
Brittney [name], Brittney [name] NO.:  [id number]  MEDICAL RECORD NO.:  [id number]  LOCATION:  A306                          FACILITY:  [facility]  PHYSICIAN:  Syanne Looney D. Felecia Shelling, MD   DATE OF BIRTH:  Aug 31, 1932  DATE OF PROCEDURE:  01/26/2012 DATE OF DISCHARGE:                                PROGRESS NOTE   SUBJECTIVE:  The patient feels better.  Her breathing is improving.  No fever or chills.  OBJECTIVE:  GENERAL:  The patient is alert, awake, and resting. VITAL SIGNS:  Blood pressure 152/78, pulse 60, respiratory rate 17, temperature 98 degrees Fahrenheit. CHEST:  Decreased air entry, few rhonchi. CARDIOVASCULAR SYSTEM:  First and second heart sounds heard.  No murmur. No gallop. ABDOMEN:  Soft and lax.  Bowel sound is positive.  No mass or organomegaly.  EXTREMITIES:  No leg edema.  LABORATORY DATA:  Potassium 3.6, chloride 98, carbon dioxide 32, glucose 250, BUN 31, creatinine 1.6, calcium 9.7.  ASSESSMENT: 1. Congestive heart failure. 2. Diabetes mellitus. 3. History of coronary artery disease. 4. History of atrial fibrillation.  PLAN:  We will continue IV diuretics.  Echo and Cardiology evaluation is pending.  We will decrease her Lasix to daily.  Continue supportive care.     Toya Palacios D. Felecia Shelling, MD     TDF/MEDQ  D:  01/26/2012  T:  01/26/2012  Job:  [id number]

## 2012-01-26 NOTE — Progress Notes (Signed)
UR chart review completed. Brittney [name]  

## 2012-01-26 NOTE — Care Management Note (Signed)
    Page 1 of 1   01/27/2012     9:24:50 AM   CARE MANAGEMENT NOTE 01/27/2012  Patient:  Brittney [name], Brittney [name]   Account Number:  [id number]  Date Initiated:  01/26/2012  Documentation initiated by:  Sharrie Rothman  Subjective/Objective Assessment:   Pt admitted from home with CHF. Pt lives at home with her daughter [name]. Pt is left alone during day when daughter is working. Pt will return home at discharge. The family has arranged for pt to go to [facility] on June 1st.     Action/Plan:   CM will arrange HH at discharge. Pt has used [facility] in the past and would like to use them again at discharge.   Anticipated DC Date:  01/31/2012   Anticipated DC Plan:  HOME W HOME HEALTH SERVICES      DC Planning Services  CM consult      [facility] Choice  HOME HEALTH   Choice offered to / List presented to:  C-2 HC POA / Guardian        HH arranged  HH-1 RN  HH-2 PT      HH agency  [facility]   Status of service:  Completed, signed off Medicare Important Message given?   (If response is "NO", the following Medicare IM given date fields will be blank) Date Medicare IM given:   Date Additional Medicare IM given:    Discharge Disposition:  HOME W HOME HEALTH SERVICES  Per UR Regulation:    If discussed at Long Length of Stay Meetings, dates discussed:    Comments:  01/27/12 1610 Arlyss Queen, RN BSN CM Pt discharged home today with [facility] Rn and PT. Alroy Bailiff of [facility] is aware and will collect pts information from the chart. No DME needs noted. Pt already has oxygen, cane, walker, and wheelchair for home use. Pt nurse and pts family is aware of discharge and discharge arrangements.  5/120/13 1340 Arlyss Queen, RN BSN CM

## 2012-01-26 NOTE — Progress Notes (Signed)
Inpatient Diabetes Program Recommendations  AACE/ADA: New Consensus Statement on Inpatient Glycemic Control  Target Ranges:  Prepandial:   less than 140 mg/dL      Peak postprandial:   less than 180 mg/dL (1-2 hours)      Critically ill patients:  140 - 180 mg/dL  Pager:  [phone] Hours:  8 am-10pm   Reason for Visit: Elevated glucose: 250 mg/dL  Inpatient Diabetes Program Recommendations Insulin - Basal: Increase Lantus to 20 units qhs Correction (SSI): Increase to Moderate Novolog Correction

## 2012-01-26 NOTE — Progress Notes (Signed)
*  PRELIMINARY RESULTS* Echocardiogram 2D Echocardiogram has been performed.  [name] 01/26/2012, 9:56 AM

## 2012-01-26 NOTE — Consult Note (Signed)
CARDIOLOGY CONSULT NOTE  Patient ID: Brittney [name] MRN: [id number] DOB/AGE: [birth year] 78 y.o.  Admit date: 01/24/2012 Referring Physician: Tonny Bollman, MD, MD Primary Cardiologist:Katz Reason for Consultation: Atrial fib and CHF Principal Problem:  *CHF (congestive heart failure) Active Problems:  Hypertension  Dyslipidemia  CAD (coronary artery disease)  Diabetes mellitus  GERD (gastroesophageal reflux disease)  Hypothyroidism  CKD (chronic kidney disease)  Hypokalemia  HPI: Brittney [name] is a 78 y/o patient of Dr. Felecia Shelling with multiple medical problems, that is followed by Dr. Myrtis Ser and Dr.Allred with known history of permanent atrial fib, s/p ablation in 09/2011, on sotolol,  CAD most recent cardiac cath in 01/2010 (demonstrating 40%  calcified proximal/ostial left main, compared to the film from January 2005, There did not seem to have been significant progression compared to that time. Intravascular ultrasound was done in  2005, and showed that the lesion was indeed nonobstructive. The patient  does have a 90-95% ostial first obtuse marginal)medical management was recommended at that time.  She has other history of PAD,(carotid and LE)  Hypertension, diabetes,      She was admitted with NV and diarrhea with decompensated diastolic CHF. Pro-BNP 3588.0. She as been placed on IV diuretics and has dropped in wt from 171 to 167 lbs. (Wt 166 lbs during last office visit on 4.06/2012). CXR showed chronic interstitial disease versus pulmonary edema on admission. She admits to some dietary indiscretion, and also feeling her heart race at times.  With dementia, I am uncertain of the accuracy of her subjective symptoms.     Review of systems complete and found to be negative unless listed above   Past Medical History  Diagnosis Date  . Hypertension   . Dyslipidemia   . CAD (coronary artery disease)     Non-STEMI August, 2010,,,, 40% left main, 30% LAD, 60% OM 2, 40% RCA,  90% OM1-culprit lesion-too small for intervention           . Ejection fraction     EF 55% ,2011 / EF 30% with tachycardia cardiomyopathy / EF 50% January, 2012  . Diabetes mellitus   . Dementia     ?? Early dementia ?? 2012  . GERD (gastroesophageal reflux disease)   . TIA (transient ischemic attack)     History of TIAs  . Hypothyroidism   . PAD (peripheral artery disease)     Carotid endarterectomy, right. Stents right peroneal, right superficial, right popliteal arteries, Dr. Edilia Bo  . Amputation     Right midfoot 2010  . CKD (chronic kidney disease)     Creatinine 1.19 at December 2012 discharge  . Gait disorder   . Vertebrobasilar insufficiency   . Depression   . Hyperkalemia     ACE Inhibitor held  . Acute renal insufficiency     Catheterization August 2010  . Blindness of right eye   . Respiratory failure     Hypoxic and hypercapnic, 2011, etiology pneumonia  . Orthostatic hypotension     Syncope January 2012 with facial trauma, amiodarone stopped  . Tachycardia induced cardiomyopathy     Previous EF 30%.  Most recently 55-60% by echo 08/14/11  . Atrial fibrillation 8/10    on pradaxa, s/p AV nodal ablation  . Ventricular tachycardia     20 beats December, 2012, asymptomatic  . Atrial flutter 05/24/09    Hospitalization in [place]; with difficult rate control; TEE cardioversion done EF 30%, secondary to tachycardia, pt did convert back tosinus rhythm; return 10/11,  amiodarone restarted 10/11, no longer on it. Recurrence with RVR 08/2011.  [name] Anemia in CKD (chronic kidney disease)   . Gastroparesis 3/11    Abnormal GES  . Drug therapy,  Sotolol     Sotolol started 08/2011.  . Drug therapy     Pradaxa January, 2013  . Complete heart block     s/p AV nodal ablation  . CHF (congestive heart failure)     Family History  Problem Relation Age of Onset  . Cancer Brother     Colon cancer  . Aortic aneurysm Mother   . Colon polyps Neg Hx   . Liver disease Neg Hx      And no CRC    History   Social History  . Marital Status: Widowed    Spouse Name: N/A    Number of Children: 3  . Years of Education: N/A   Occupational History  . RETIRED    Social History Main Topics  . Smoking status: Never Smoker   . Smokeless tobacco: Never Used  . Alcohol Use: No  . Drug Use: No  . Sexually Active: No   Other Topics Concern  . Not on file   Social History Narrative   Gets regular exercise.    Past Surgical History  Procedure Date  . Total abdominal hysterectomy   . Cholecystectomy   . Carotid endarterectomy   . Amputation 2010    RIGHT MIDFOOT  . Tonsillectomy   . Eye surgeries     Several right  . Pacemaker insertion     SJM Pacemaker implant 9/10  . Pci stent to the right peroneal and right superficial as well as right popliteal arteries     Dr. Edilia Bo  . Av nodal ablation 08/2011    by Dr Ladona Ridgel     Prescriptions prior to admission  Medication Sig Dispense Refill  . acetaminophen (TYLENOL) 500 MG tablet Take 500 mg by mouth every 6 (six) hours as needed. For pain      . Ascorbic Acid (VITAMIN C) 1000 MG tablet Take 1,000 mg by mouth daily.        . dabigatran (PRADAXA) 150 MG CAPS Take 1 capsule (150 mg total) by mouth every 12 (twelve) hours.  60 capsule  3  . docusate sodium (COLACE) 100 MG capsule Take 100 mg by mouth daily.      . furosemide (LASIX) 40 MG tablet Take 1 tablet (40 mg total) by mouth daily.  30 tablet  6  . insulin aspart (NOVOLOG) 100 UNIT/ML injection Inject 5-9 Units into the skin 5 (five) times daily. Per sliding scale.  90-200: 5 units, 201-250: 6 units, 251-300: 7 units, 301-350: 8 units, 351-400: 9 units      . insulin glargine (LANTUS) 100 UNIT/ML injection Inject 15 Units into the skin at bedtime.        [name] levothyroxine (SYNTHROID, LEVOTHROID) 75 MCG tablet Take 75 mcg by mouth daily.        [name] pyridOXINE (VITAMIN B-6) 100 MG tablet Take 100 mg by mouth daily.        . sertraline (ZOLOFT) 100 MG tablet Take  100 mg by mouth daily.        . sotalol (BETAPACE) 80 MG tablet Take 1 tablet (80 mg total) by mouth daily.  30 tablet  6  . travoprost, benzalkonium, (TRAVATAN) 0.004 % ophthalmic solution Place 1 drop into both eyes at bedtime.        . nitroGLYCERIN (  NITROSTAT) 0.4 MG SL tablet Place 0.4 mg under the tongue every 5 (five) minutes as needed. For chest pain       Echocardiogram: 08/14/2011 - Left ventricle: The cavity size was normal. Wall thickness was increased in a pattern of mild LVH. Systolic function was normal. The estimated ejection fraction was in the range of 55% to 60%. - Mitral valve: Mild regurgitation.  Cardiac Cath 01/18/2012 FINDINGS:  1. Hemodynamics, aorta 184/64, LV 182/16.  2. Left ventriculography. EF was estimated at 55-60%. There were no  regional wall motion abnormalities.  3. RCA. The RCA had a high anterior takeoff. It was engaged using  the AL-1 catheter. There was about a 40% mid RCA stenosis. The  RCA was a relatively small nondominant vessel.  4. Left main. The left main had a 40% ostial calcified lesion. This  was compared to the cath films from 2005 and was noted to have  minimal progression compared to that time.  5. LAD system. There was a 30% proximal LAD stenosis otherwise only  luminal irregularities.  6. Left circumflex system. The circumflex was a large dominant  vessel. There is a small high first obtuse marginal that had a 90%  ostial stenosis. There was a moderate-sized high second obtuse  marginal that had a 50-60% ostial stenosis. There is minimal  disease in the third obtuse marginal and there is a large left PDA  with luminal irregularities.  AV Nodal Ablation 09/26/2011  This demonstrates successful AV node ablation in a patient  with very difficult to control atrial fibrillation/flutter with a rapid  ventricular response.  Physical Exam: Blood pressure 152/78, pulse 66, temperature 100 F (37.8 C), temperature source Axillary, resp.  rate 17, height 5\' 8"  (1.727 m), weight 167 lb 5.3 oz (75.9 kg), SpO2 94.00%.  General: Well developed, well nourished, in no acute distress. Very hard of hearing. Head: Eyes PERRLA, No xanthomas.   Normal cephalic and atramatic  Lungs: Inspiratory wheezes with mild crackles.  Heart: HRRR S1 S2, 1/6 systolic murmur.  Pulses are 2+ & equal.            Soft right carotid bruit. No JVD.  No abdominal bruits. No femoral bruits. Abdomen: Bowel sounds are positive, abdomen soft and non-tender without masses or                  Hernia's noted. Msk:  Back normal, normal gait. Diminished strength and tone for age. Extremities: No clubbing, cyanosis or edema.  DP +1 Neuro: Alert and poor memory, very hard of hearing. Psych:  Good affect, responds appropriately  Labs:   Lab Results  Component Value Date   WBC 12.7* 01/24/2012   HGB 12.8 01/24/2012   HCT 39.4 01/24/2012   MCV 91.8 01/24/2012   PLT 264 01/24/2012    Lab 01/26/12 0446 01/24/12 2134  NA 138 --  K 3.6 --  CL 98 --  CO2 30 --  BUN 31* --  CREATININE 1.16* --  CALCIUM 9.7 --  PROT -- 7.8  BILITOT -- 0.6  ALKPHOS -- 104  ALT -- 21  AST -- 21  GLUCOSE 250* --   Radiology: Dg Chest 2 View  01/24/2012  *RADIOLOGY REPORT*  Clinical Data: Shortness of breath and weakness.  CHEST - 2 VIEW  Comparison: 09/23/2011  Findings: 2242 hours. The cardiopericardial silhouette is enlarged. Diffuse interstitial opacity persists.  There is likely underlying chronic interstitial changes but superimposed interstitial pulmonary edema is a distinct consideration.  Left-sided dual lead permanent pacemaker again noted. Telemetry leads overlie the chest.  IMPRESSION: Cardiomegaly with underlying chronic interstitial coarsening. Superimposed interstitial pulmonary edema is difficult to exclude.  Original Report Authenticated By: ERIC A. MANSELL, M.D.   EKG: Paced rhythm with underlying atrial irregularity.  ASSESSMENT AND PLAN:   1. Acute on Chronic  Diastolic CHF: Etiology unclear. She is a difficult historian concerning events prior to admission,. She was not found to be anemic She has diuresed with IV Lasix..Creatinine is stable. Breathing status is improved. VSS. She is back to baseline wt from April office visit. Can change back to po lasix at home dose of 40 mg daily in am.   2. Atrial fibrillation:  Atrial fib with RVR could be possible cause. Review of Dr. Myrtis Ser note discussed need to stop sotolol after AV nodal ablation if she did not have recurrent Afib. Pacemaker was to be interrogated prior making that decision. Will have St. Jude pacemaker interrogated today for evaluation of this and adjustment of medications should this be necessary.Continue dabigaitran 150 mg BID as directed.  3. St Jude Pacemaker in situ: Will have this interrogated for evaluation of break through atrial fib and medication adjustments if necessary. Will follow after completed for more recommendations.  Bettey Mare. Lyman Bishop NP [facility] 01/26/2012, 9:20 AM   Cardiology Attending Patient interviewed and examined. Discussed with Joni Reining, NP.  Above note annotated and modified based upon my findings.  Admitted with a moderately elevated BNP level and a question of pulmonary edema on chest x-ray.  I&O has been negative a total of 2.5 L, but weight has not changed. Dyspnea has resolved. Exam is unimpressive.  It is not clear whether she was admitted with congestive heart failure, but she looks fine at this point. No further testing is anticipated, and patient can be discharged in the morning from a cardiac standpoint with close office followup.  [place] Bing, MD 01/27/2012, 5:33 PM

## 2012-01-26 NOTE — Progress Notes (Signed)
Pt's temperature 98.1 after Tylenol administered. Pt states "I feel better". Will continue to monitor.

## 2012-01-27 LAB — BASIC METABOLIC PANEL
BUN: 32 mg/dL — ABNORMAL HIGH (ref 6–23)
Chloride: 99 mEq/L (ref 96–112)
GFR calc Af Amer: 53 mL/min — ABNORMAL LOW (ref >90)
Potassium: 4.3 mEq/L (ref 3.5–5.1)

## 2012-01-27 LAB — GLUCOSE, CAPILLARY
Glucose-Capillary: 178 mg/dL — ABNORMAL HIGH (ref 70–99)
Glucose-Capillary: 228 mg/dL — ABNORMAL HIGH (ref 70–99)

## 2012-01-27 MED ORDER — SODIUM CHLORIDE 0.9 % IJ SOLN
INTRAMUSCULAR | Status: AC
  Filled 2012-01-27: qty 3

## 2012-01-27 MED ORDER — FUROSEMIDE 40 MG PO TABS: 40.0000 mg | ORAL_TABLET | Freq: Every day | ORAL | Status: DC

## 2012-01-27 MED ORDER — DILTIAZEM HCL 60 MG PO TABS
60.0000 mg | ORAL_TABLET | Freq: Two times a day (BID) | ORAL | Status: DC
  Administered 2012-01-27: 60 mg via ORAL
  Filled 2012-01-27: qty 1

## 2012-01-27 MED ORDER — LISINOPRIL 10 MG PO TABS: 10.0000 mg | ORAL_TABLET | Freq: Every day | ORAL | Status: DC

## 2012-01-27 MED ORDER — MOXIFLOXACIN HCL 400 MG PO TABS: 400.0000 mg | ORAL_TABLET | Freq: Every day | ORAL | Status: AC

## 2012-01-27 NOTE — Discharge Instructions (Signed)
[facility] for registered nurse and physical therapy. 

## 2012-01-27 NOTE — Progress Notes (Signed)
Discharge instructions given to daughter.  Stated verbal understanding.  IV dc'd.  Tolerated well.

## 2012-01-27 NOTE — Progress Notes (Addendum)
SUBJECTIVE:Pleasantly confused. No complaints of palpitations or shortness of breath.  Principal Problem:  *CHF (congestive heart failure) Active Problems:  Hypertension  Dyslipidemia  CAD (coronary artery disease)  Diabetes mellitus  GERD (gastroesophageal reflux disease)  Hypothyroidism  CKD (chronic kidney disease)  Hypokalemia   LABS: Basic Metabolic Panel:  Basename 01/27/12 0517 01/26/12 0446 01/25/12 0500  NA 137 138 --  K 4.3 3.6 --  CL 99 98 --  CO2 28 30 --  GLUCOSE 176* 250* --  BUN 32* 31* --  CREATININE 1.13* 1.16* --  CALCIUM 9.7 9.7 --  MG -- -- 1.8  PHOS -- -- --   Liver Function Tests:  [facility] 01/24/12 2134  AST 21  ALT 21  ALKPHOS 104  BILITOT 0.6  PROT 7.8  ALBUMIN 3.5   No results found for this basename: LIPASE:2,AMYLASE:2 in the last 72 hours CBC:  Basename 01/24/12 2134  WBC 12.7*  NEUTROABS 6.7  HGB 12.8  HCT 39.4  MCV 91.8  PLT 264   Cardiac Enzymes:  Basename 01/25/12 1640 01/25/12 0918 01/25/12 0049  CKTOTAL 30 33 44  CKMB 1.5 1.6 2.0  CKMBINDEX -- -- --  TROPONINI <0.30 <0.30 <0.30   Hemoglobin A1C:  Basename 01/25/12 0500  HGBA1C 8.2*   Fasting Lipid Panel: No results found for this basename: CHOL,HDL,LDLCALC,TRIG,CHOLHDL,LDLDIRECT in the last 72 hours Thyroid Function Tests:  Basename 01/25/12 0049  TSH 2.924  T4TOTAL --  T3FREE --  THYROIDAB --    RADIOLOGY: IMPRESSION:CXR 01/24/2012 Cardiomegaly with underlying chronic interstitial coarsening. Superimposed interstitial pulmonary edema is difficult to exclude    PHYSICAL EXAM BP 160/75  Pulse 69  Temp(Src) 101 F (38.3 C) (Oral)  Resp 20  Ht 5\' 8"  (1.727 m)  Wt 169 lb 12.1 oz (77 kg)  BMI 25.81 kg/m2  SpO2 96% General: Well developed, well nourished, in no acute distress Head: Eyes PERRLA, No xanthomas.   Normal cephalic and atramatic  Lungs: Scattered crackles, no wheezes. Heart: HRRR S1 S2, 1/6 MRG .  Pulses are 2+ & equal.            No  carotid bruit. No JVD.  No abdominal bruits. No femoral bruits. Abdomen: Bowel sounds are positive, abdomen soft and non-tender without masses or                  Hernia's noted. Msk:  Back normal, normal gait. Normal strength and tone for age. Extremities: No clubbing, cyanosis or edema.  DP +1 Neuro: Alert and oriented X 3. Psych:  Good affect, responds appropriately  TELEMETRY: Reviewed telemetry pt in Paced rhythm. Rate of 60 bpm.  ASSESSMENT AND PLAN:  1. Acute on Chronic Diastolic CHF: Etiology unclear. She is a difficult historian concerning events prior to admission,. Wt is stable. She will be changed back to Lasix 40 mg po daily.Echo has been completed, results are pending. 2. Atrial fibrillation:  Unclear why she is being treated with an antiarrhythmic after AV nodal ablation.   Pacemaker was interrogated with elevation in atrial rates as high as 320 bpm with ventricular pacing in the 70-80s. She had 586 mode switches documented. 3. St Jude Pacemaker in situ: As above. Full documentation of the interrogation is on wall chart for review.   Bettey Mare. Lyman Bishop NP [facility] 01/27/2012, 10:47 AM  Cardiology Attending Patient interviewed and examined. Discussed with Joni Reining, NP.  Above note annotated and modified based upon my findings.  Labs okay except for a  moderately elevated A1c level of 8.3.  She is pacemaker dependent without any demonstrable A-V conduction. Unless Dr. Ladona Ridgel is attempting to preserve A-V synchrony, sotalol should be discontinued.  She is doing well with full anticoagulation using dabigatran-that agent will be continued.  [place] Bing, MD 01/27/2012, 5:40 PM

## 2012-01-27 NOTE — Discharge Summary (Signed)
Brittney [name], [name] NO.:  [id number]  MEDICAL RECORD NO.:  [id number]  LOCATION:  A306                          FACILITY:  [facility]  PHYSICIAN:  Tanysha Quant D. Felecia Shelling, MD   DATE OF BIRTH:  04-12-1932  DATE OF ADMISSION:  01/24/2012 DATE OF DISCHARGE:  05/21/2013LH                              DISCHARGE SUMMARY   DISCHARGE DIAGNOSES: 1. Acute-on-chronic congestive heart failure, etiology unclear. 2. Atrial fibrillation with controlled  grade 3 status post pacemaker     insertion. 3. Hypertension. 4. Hyperlipidemia. 5. History of coronary artery disease. 6. History of gastroesophageal disease. 7. Hypothyroidism. 8. History of gait abnormality.  DISCHARGE MEDICATIONS: 1. Lisinopril 10 mg p.o. daily. 2. Avelox 400 mg p.o. daily for 3 days. 3. Acetaminophen 500 mg p.o. q.6 h. p.r.n. for pain. 4. Pradaxa 150 mg q.12 h. 5. Colace 100 mg daily. 6. Lasix 40 mg p.o. daily. 7. NovoLog insulin according to sliding scale. 8. Lantus insulin 15 units at bedtime. 9. Synthroid 75 mcg daily. 10.Nitroglycerin 0.4 mg sublingual q.5 minutes p.r.n. for chest pain     x3. 11.Vitamin B6 100 mg daily. 12.Zoloft 100 mg daily. 13.Betapace 80 mg p.o. daily. 14.Travatan eyedrop 1 drop in both eyes at bedtime. 15.Vitamin C 100 mg p.o. daily.  DISPOSITION:  The patient will be discharged to home in stable condition.  HOSPITAL COURSE:  This is a 78 year old female patient with history of multiple medical illnesses, was admitted due to shortness of breath. She was found to have decompensated congestive heart failure.  The patient was diuresed with IV diuretics.  She also has superimposed urinary tract infection for which she is started on antibiotics.  The patient improved.  She was evaluated by Cardiology.  The patient is back to her baseline.  She is planned to be discharged to home and to be followed in outpatient.  DISCHARGE INSTRUCTION:  The patient will be followed in my office  in 1 week duration.  She will be also followed with Cardiology.     Sharice Harriss D. Felecia Shelling, MD     TDF/MEDQ  D:  01/27/2012  T:  01/27/2012  Job:  [id number]

## 2012-01-30 LAB — CULTURE, BLOOD (ROUTINE X 2)
Culture: NO GROWTH
Culture: NO GROWTH

## 2012-02-04 ENCOUNTER — Ambulatory Visit: Payer: Medicare Other | Admitting: Orthopedic Surgery

## 2012-03-09 ENCOUNTER — Ambulatory Visit (INDEPENDENT_AMBULATORY_CARE_PROVIDER_SITE_OTHER): Payer: Medicare Other | Admitting: Cardiology

## 2012-03-09 ENCOUNTER — Encounter: Payer: Self-pay | Admitting: Cardiology

## 2012-03-09 VITALS — BP 139/73 | HR 70 | Ht 67.0 in | Wt 161.1 lb

## 2012-03-09 DIAGNOSIS — R0989 Other specified symptoms and signs involving the circulatory and respiratory systems: Secondary | ICD-10-CM

## 2012-03-09 DIAGNOSIS — Z79899 Other long term (current) drug therapy: Secondary | ICD-10-CM

## 2012-03-09 DIAGNOSIS — I951 Orthostatic hypotension: Secondary | ICD-10-CM

## 2012-03-09 DIAGNOSIS — I2510 Atherosclerotic heart disease of native coronary artery without angina pectoris: Secondary | ICD-10-CM

## 2012-03-09 DIAGNOSIS — I509 Heart failure, unspecified: Secondary | ICD-10-CM

## 2012-03-09 DIAGNOSIS — I10 Essential (primary) hypertension: Secondary | ICD-10-CM

## 2012-03-09 DIAGNOSIS — I4891 Unspecified atrial fibrillation: Secondary | ICD-10-CM

## 2012-03-09 DIAGNOSIS — R9430 Abnormal result of cardiovascular function study, unspecified: Secondary | ICD-10-CM

## 2012-03-09 MED ORDER — FUROSEMIDE 40 MG PO TABS: 40.0000 mg | ORAL_TABLET | Freq: Two times a day (BID) | ORAL | Status: DC

## 2012-03-09 NOTE — Assessment & Plan Note (Signed)
At this time she is not having any orthostatic hypotension.

## 2012-03-09 NOTE — Progress Notes (Signed)
HPI Patient is seen today for cardiology followup. I saw her last April, 2013. In May, 2013 she was admitted in [place] to the hospital. It was felt that she had some CHF. She was diuresis. She was seen by the cardiology team. It is my understanding that her pacemaker was interrogated. I have reviewed all of these records. I have also reviewed multiple progress notes during that hospitalization. The patient is pacer dependent with her AV node ablation. She was not having any marked tachycardia. She definitely felt better when she went home. However she and her daughter now described that she continues to have some exertional shortness of breath. She does not appear to have PND or orthopnea. However she does sleep on 2 pillows. She's not having any significant chest pain.  I have spent an extensive amount of time reviewing her hospital records. Her last echo was done in December, 2012. There was not an echo done during her recent hospitalization that I can find.  Allergies  Allergen Reactions  . Amiodarone     Autonomic dysfunction  . Morphine Sulfate     REACTION: jerking  . Oxycodone Hcl Itching and Other (See Comments)    Feels weird    Current Outpatient Prescriptions  Medication Sig Dispense Refill  . acetaminophen (TYLENOL) 500 MG tablet Take 500 mg by mouth every 6 (six) hours as needed. For pain      . Ascorbic Acid (VITAMIN C) 1000 MG tablet Take 1,000 mg by mouth daily.        . dabigatran (PRADAXA) 150 MG CAPS Take 1 capsule (150 mg total) by mouth every 12 (twelve) hours.  60 capsule  3  . docusate sodium (COLACE) 100 MG capsule Take 100 mg by mouth daily.      . furosemide (LASIX) 40 MG tablet Take 1 tablet (40 mg total) by mouth daily.  30 tablet  6  . insulin aspart (NOVOLOG) 100 UNIT/ML injection Inject 5-9 Units into the skin 5 (five) times daily. Per sliding scale.  90-200: 5 units, 201-250: 6 units, 251-300: 7 units, 301-350: 8 units, 351-400: 9 units      . insulin  glargine (LANTUS) 100 UNIT/ML injection Inject 15 Units into the skin at bedtime.        [name] levothyroxine (SYNTHROID, LEVOTHROID) 75 MCG tablet Take 75 mcg by mouth daily.        [name] lisinopril (PRINIVIL) 10 MG tablet Take 1 tablet (10 mg total) by mouth daily.  30 tablet  3  . pyridOXINE (VITAMIN B-6) 100 MG tablet Take 100 mg by mouth daily.        . sertraline (ZOLOFT) 100 MG tablet Take 100 mg by mouth daily.        . sotalol (BETAPACE) 80 MG tablet Take 1 tablet (80 mg total) by mouth daily.  30 tablet  6  . travoprost, benzalkonium, (TRAVATAN) 0.004 % ophthalmic solution Place 1 drop into both eyes at bedtime.        . nitroGLYCERIN (NITROSTAT) 0.4 MG SL tablet Place 0.4 mg under the tongue every 5 (five) minutes as needed. For chest pain        History   Social History  . Marital Status: Widowed    Spouse Name: N/A    Number of Children: 3  . Years of Education: N/A   Occupational History  . RETIRED    Social History Main Topics  . Smoking status: Never Smoker   . Smokeless tobacco: Never  Used  . Alcohol Use: No  . Drug Use: No  . Sexually Active: No   Other Topics Concern  . Not on file   Social History Narrative   Gets regular exercise.    Family History  Problem Relation Age of Onset  . Cancer Brother     Colon cancer  . Aortic aneurysm Mother   . Colon polyps Neg Hx   . Liver disease Neg Hx     And no CRC    Past Medical History  Diagnosis Date  . Hypertension   . Dyslipidemia   . CAD (coronary artery disease)     Non-STEMI August, 2010,,,, 40% left main, 30% LAD, 60% OM 2, 40% RCA, 90% OM1-culprit lesion-too small for intervention           . Ejection fraction     EF 55% ,2011 / EF 30% with tachycardia cardiomyopathy / EF 50% January, 2012  . Diabetes mellitus   . Dementia     ?? Early dementia ?? 2012  . GERD (gastroesophageal reflux disease)   . TIA (transient ischemic attack)     History of TIAs  . Hypothyroidism   . PAD (peripheral artery  disease)     Carotid endarterectomy, right. Stents right peroneal, right superficial, right popliteal arteries, Dr. Edilia Bo  . Amputation     Right midfoot 2010  . CKD (chronic kidney disease)     Creatinine 1.19 at December 2012 discharge  . Gait disorder   . Vertebrobasilar insufficiency   . Depression   . Hyperkalemia     ACE Inhibitor held  . Acute renal insufficiency     Catheterization August 2010  . Blindness of right eye   . Respiratory failure     Hypoxic and hypercapnic, 2011, etiology pneumonia  . Orthostatic hypotension     Syncope January 2012 with facial trauma, amiodarone stopped  . Tachycardia induced cardiomyopathy     Previous EF 30%.  Most recently 55-60% by echo 08/14/11  . Atrial fibrillation 8/10    on pradaxa, s/p AV nodal ablation  . Ventricular tachycardia     20 beats December, 2012, asymptomatic  . Atrial flutter 05/24/09    Hospitalization in [place]; with difficult rate control; TEE cardioversion done EF 30%, secondary to tachycardia, pt did convert back tosinus rhythm; return 10/11, amiodarone restarted 10/11, no longer on it. Recurrence with RVR 08/2011.  [name] Anemia in CKD (chronic kidney disease)   . Gastroparesis 3/11    Abnormal GES  . Drug therapy,  Sotolol     Sotolol started 08/2011.  . Drug therapy     Pradaxa January, 2013  . Complete heart block     s/p AV nodal ablation  . CHF (congestive heart failure)     Past Surgical History  Procedure Date  . Total abdominal hysterectomy   . Cholecystectomy   . Carotid endarterectomy   . Amputation 2010    RIGHT MIDFOOT  . Tonsillectomy   . Eye surgeries     Several right  . Pacemaker insertion     SJM Pacemaker implant 9/10  . Pci stent to the right peroneal and right superficial as well as right popliteal arteries     Dr. Edilia Bo  . Av nodal ablation 08/2011    by Dr Ladona Ridgel    ROS   Patient denies fever, chills, headache, sweats, rash, change in vision, change in hearing, chest  pain, cough, nausea vomiting, urinary symptoms. All other systems are reviewed  and are negative.  PHYSICAL EXAM   Patients here with her daughter. She is oriented to person time and place. Affect is normal. There is no jugulovenous distention. There is decreased breath sounds. I think she does have a few basilar rales. Cardiac exam reveals S1 and S2. Her abdomen is soft. There is no significant peripheral edema. There are no skin rashes. There no musculoskeletal deformities.  Filed Vitals:   03/09/12 1011  BP: 139/73  Pulse: 70  Height: 5\' 7"  (1.702 m)  Weight: 161 lb 1.9 oz (73.084 kg)  SpO2: 94%     ASSESSMENT & PLAN

## 2012-03-09 NOTE — Assessment & Plan Note (Signed)
Coronary disease is stable. It does not appear that her current symptoms are ischemic in origin. However this will have to be kept in mind as she has fairly significant exertional shortness of breath.

## 2012-03-09 NOTE — Assessment & Plan Note (Signed)
Sotalol had been started in December, 2012 to try to keep her in sinus rhythm. However she has A-V node ablation. She is not maintaining sinus rhythm. Sotalol will be stopped.

## 2012-03-09 NOTE — Assessment & Plan Note (Signed)
Clinically the patient had CHF in the hospital May, 2013. I think she has a few rales today. She continues to have exertional shortness of breath. I will increase her diuretic dose. We will follow her very carefully with follow up of her renal function. I will then see her back for followup. 2-D echo will be done.

## 2012-03-09 NOTE — Assessment & Plan Note (Signed)
Blood pressure is controlled. No change in therapy. 

## 2012-03-09 NOTE — Assessment & Plan Note (Signed)
Her last echo was done in 2012. We need followup echo data to see if there's been a significant change in her LV function.

## 2012-03-09 NOTE — Patient Instructions (Addendum)
Your physician recommends that you schedule a follow-up appointment in: 2-3 weeks with Dr. Myrtis Ser.  Your physician has recommended you make the following change in your medication: stop sotalol Increase furosemide to 40 mg twice daily. A new prescription has been sent to your pharmacy.  Your physician recommends that you return for lab work in one week at [facility] for BMET around 03/16/12.  Your physician has requested that you have an echocardiogram. Echocardiography is a painless test that uses sound waves to create images of your heart. It provides your doctor with information about the size and shape of your heart and how well your heart's chambers and valves are working. This procedure takes approximately one hour. There are no restrictions for this procedure.

## 2012-03-09 NOTE — Assessment & Plan Note (Signed)
The patient has had an AV node ablation. We know from her hospital interrogation that she has rapid atrial rates. Her ventricular rate is controlled because of the A-V node ablation. We are not able to maintain sinus rhythm. Sotalol will be stopped.

## 2012-03-17 ENCOUNTER — Encounter: Payer: Self-pay | Admitting: Cardiology

## 2012-03-19 ENCOUNTER — Telehealth: Payer: Self-pay | Admitting: *Deleted

## 2012-03-19 NOTE — Telephone Encounter (Signed)
Left message on voicemail for patient to call office. 

## 2012-03-19 NOTE — Telephone Encounter (Signed)
Message copied by Eustace Moore on Fri Mar 19, 2012  4:29 PM ------      Message from: Perla, [place] D      Created: Fri Mar 19, 2012 11:34 AM       There has been some increase in the BUN and creatinine. We will plan to continue to follow this for now. Please check to see how the patient is doing and whether she has lost any fluid weight and how she is breathing

## 2012-03-23 NOTE — Telephone Encounter (Signed)
Left message for patient to call office.  

## 2012-03-24 NOTE — Telephone Encounter (Signed)
Daughter informed and said patient lost 1 pound since visit and patient was feeling okay. No breathing problems noted by daughter. Daughter stated that Nephrologist, Dr. Fransico Him was keeping a close check on her kidney function.

## 2012-03-26 ENCOUNTER — Encounter: Payer: Self-pay | Admitting: Vascular Surgery

## 2012-03-26 NOTE — Telephone Encounter (Signed)
Great!

## 2012-03-31 ENCOUNTER — Other Ambulatory Visit: Payer: Self-pay

## 2012-03-31 ENCOUNTER — Other Ambulatory Visit (INDEPENDENT_AMBULATORY_CARE_PROVIDER_SITE_OTHER): Payer: Medicare Other

## 2012-03-31 DIAGNOSIS — I509 Heart failure, unspecified: Secondary | ICD-10-CM

## 2012-03-31 DIAGNOSIS — I501 Left ventricular failure: Secondary | ICD-10-CM

## 2012-03-31 DIAGNOSIS — I4891 Unspecified atrial fibrillation: Secondary | ICD-10-CM

## 2012-04-06 ENCOUNTER — Telehealth: Payer: Self-pay | Admitting: *Deleted

## 2012-04-06 NOTE — Addendum Note (Signed)
Addended by: Eustace Moore on: 04/06/2012 11:45 AM   Modules accepted: Orders

## 2012-04-06 NOTE — Telephone Encounter (Signed)
Daughter to inform our office that she has changed pharmacies for patient and will now be using [facility]. Daughter is requesting we send a list of her medications to [facility]. Nurse advised her that a copy of medications would be sent.

## 2012-04-09 ENCOUNTER — Encounter: Payer: Self-pay | Admitting: *Deleted

## 2012-04-13 ENCOUNTER — Encounter: Payer: Self-pay | Admitting: Cardiology

## 2012-04-14 ENCOUNTER — Ambulatory Visit (INDEPENDENT_AMBULATORY_CARE_PROVIDER_SITE_OTHER): Payer: Medicare Other | Admitting: Cardiology

## 2012-04-14 ENCOUNTER — Encounter: Payer: Self-pay | Admitting: Cardiology

## 2012-04-14 VITALS — BP 145/81 | HR 69 | Ht 66.5 in | Wt 160.0 lb

## 2012-04-14 DIAGNOSIS — I2510 Atherosclerotic heart disease of native coronary artery without angina pectoris: Secondary | ICD-10-CM

## 2012-04-14 DIAGNOSIS — I4891 Unspecified atrial fibrillation: Secondary | ICD-10-CM

## 2012-04-14 DIAGNOSIS — I10 Essential (primary) hypertension: Secondary | ICD-10-CM

## 2012-04-14 DIAGNOSIS — I951 Orthostatic hypotension: Secondary | ICD-10-CM

## 2012-04-14 DIAGNOSIS — R9430 Abnormal result of cardiovascular function study, unspecified: Secondary | ICD-10-CM

## 2012-04-14 DIAGNOSIS — R0989 Other specified symptoms and signs involving the circulatory and respiratory systems: Secondary | ICD-10-CM

## 2012-04-14 DIAGNOSIS — I509 Heart failure, unspecified: Secondary | ICD-10-CM

## 2012-04-14 NOTE — Assessment & Plan Note (Signed)
Coronary disease is stable. Previously she had exertional shortness of breath. It was related to volume and not ischemia. No further workup.

## 2012-04-14 NOTE — Assessment & Plan Note (Signed)
Blood pressure stable. No change in therapy. 

## 2012-04-14 NOTE — Assessment & Plan Note (Signed)
Her followup echo shows good LV function. No change in therapy.

## 2012-04-14 NOTE — Progress Notes (Signed)
HPI   Patient is seen today for followup and she looks great. She's had a multitude of problems. Fortunately with her pacemaker and AV node ablation she's doing very well. She did have some mild fluid overload when I saw her last. I increased her diuretic dose. Her followup renal function was stable. She's also followed by her primary physician and endocrinology and her labs are being followed carefully. She and one of her daughters report to me today that her renal function is stable with those labs. Her fluid status seems to be great. She is active.  After her last visit I arrange for followup 2-D echo to be sure that she has not had a change in her LV function. Her ejection fraction was 55-60%.  Allergies  Allergen Reactions  . Amiodarone     Autonomic dysfunction  . Morphine Sulfate     REACTION: jerking  . Oxycodone Hcl Itching and Other (See Comments)    Feels weird    Current Outpatient Prescriptions  Medication Sig Dispense Refill  . acetaminophen (TYLENOL) 500 MG tablet Take 500 mg by mouth every 6 (six) hours as needed. For pain      . Ascorbic Acid (VITAMIN C) 1000 MG tablet Take 1,000 mg by mouth daily.        . furosemide (LASIX) 40 MG tablet Take 1 tablet (40 mg total) by mouth 2 (two) times daily.  60 tablet  3  . insulin aspart (NOVOLOG) 100 UNIT/ML injection Inject 5-9 Units into the skin 5 (five) times daily. Per sliding scale.  90-200: 5 units, 201-250: 6 units, 251-300: 7 units, 301-350: 8 units, 351-400: 9 units      . insulin glargine (LANTUS) 100 UNIT/ML injection Inject 15 Units into the skin at bedtime.        [name] levothyroxine (SYNTHROID, LEVOTHROID) 75 MCG tablet Take 75 mcg by mouth daily.        [name] lisinopril (PRINIVIL) 10 MG tablet Take 1 tablet (10 mg total) by mouth daily.  30 tablet  3  . nitroGLYCERIN (NITROSTAT) 0.4 MG SL tablet Place 0.4 mg under the tongue every 5 (five) minutes as needed. For chest pain      . sertraline (ZOLOFT) 100 MG tablet Take 100  mg by mouth daily.        . travoprost, benzalkonium, (TRAVATAN) 0.004 % ophthalmic solution Place 1 drop into both eyes at bedtime.          History   Social History  . Marital Status: Widowed    Spouse Name: N/A    Number of Children: 3  . Years of Education: N/A   Occupational History  . RETIRED    Social History Main Topics  . Smoking status: Never Smoker   . Smokeless tobacco: Never Used  . Alcohol Use: No  . Drug Use: No  . Sexually Active: No   Other Topics Concern  . Not on file   Social History Narrative   Gets regular exercise.    Family History  Problem Relation Age of Onset  . Cancer Brother     Colon cancer  . Aortic aneurysm Mother   . Colon polyps Neg Hx   . Liver disease Neg Hx     And no CRC    Past Medical History  Diagnosis Date  . Hypertension   . Dyslipidemia   . CAD (coronary artery disease)     Non-STEMI August, 2010,,,, 40% left main, 30% LAD, 60% OM  2, 40% RCA, 90% OM1-culprit lesion-too small for intervention           . Ejection fraction     EF 55% ,2011 / EF 30% with tachycardia cardiomyopathy / EF 50% January, 2012  . Diabetes mellitus   . Dementia     ?? Early dementia ?? 2012  . GERD (gastroesophageal reflux disease)   . TIA (transient ischemic attack)     History of TIAs  . Hypothyroidism   . PAD (peripheral artery disease)     Carotid endarterectomy, right. Stents right peroneal, right superficial, right popliteal arteries, Dr. Edilia Bo  . Amputation     Right midfoot 2010  . CKD (chronic kidney disease)     Creatinine 1.19 at December 2012 discharge  . Gait disorder   . Vertebrobasilar insufficiency   . Depression   . Hyperkalemia     ACE Inhibitor held  . Acute renal insufficiency     Catheterization August 2010  . Blindness of right eye   . Respiratory failure     Hypoxic and hypercapnic, 2011, etiology pneumonia  . Orthostatic hypotension     Syncope January 2012 with facial trauma, amiodarone stopped  .  Tachycardia induced cardiomyopathy     Previous EF 30%.  Most recently 55-60% by echo 08/14/11  . Atrial fibrillation 8/10    on pradaxa, s/p AV nodal ablation  . Ventricular tachycardia     20 beats December, 2012, asymptomatic  . Atrial flutter 05/24/09    Hospitalization in [place]; with difficult rate control; TEE cardioversion done EF 30%, secondary to tachycardia, pt did convert back tosinus rhythm; return 10/11, amiodarone restarted 10/11, no longer on it. Recurrence with RVR 08/2011.  [name] Anemia in CKD (chronic kidney disease)   . Gastroparesis 3/11    Abnormal GES  . Drug therapy,  Sotolol     Sotolol started 08/2011.  . Drug therapy     Pradaxa January, 2013  . Complete heart block     s/p AV nodal ablation  . CHF (congestive heart failure)     Past Surgical History  Procedure Date  . Total abdominal hysterectomy   . Cholecystectomy   . Carotid endarterectomy   . Amputation 2010    RIGHT MIDFOOT  . Tonsillectomy   . Eye surgeries     Several right  . Pacemaker insertion     SJM Pacemaker implant 9/10  . Pci stent to the right peroneal and right superficial as well as right popliteal arteries     Dr. Edilia Bo  . Av nodal ablation 08/2011    by Dr Ladona Ridgel    ROS   Patient denies fever, chills, headache, sweats, rash, change in vision, change in hearing, chest pain, cough, nausea vomiting, urinary symptoms. All other systems are reviewed and are negative.  PHYSICAL EXAM   She really does look good today. She is oriented to person time and place. Affect is normal. There is no jugulovenous distention. Lungs are clear. Respiratory effort is nonlabored. Cardiac exam reveals S1-S2. There are no clicks or significant murmurs. The abdomen is soft. She has no significant edema.  Filed Vitals:   04/14/12 1433  BP: 145/81  Pulse: 69  Height: 5' 6.5" (1.689 m)  Weight: 160 lb (72.576 kg)  SpO2: 94%     ASSESSMENT & PLAN

## 2012-04-14 NOTE — Assessment & Plan Note (Signed)
She is not having any recurrent significant orthostatic symptoms.

## 2012-04-14 NOTE — Assessment & Plan Note (Signed)
Her atrial arrhythmias no longer cause any problems because of her AV node ablation. She is pacing in doing very well. I had stopped her sotalol. No change in therapy.

## 2012-04-14 NOTE — Assessment & Plan Note (Signed)
Her tendency towards volume overload is now controlled with very careful attention to her diet, daily weights, and the use of her current diuretics.

## 2012-04-14 NOTE — Patient Instructions (Signed)
Continue all current medications. Your physician wants you to follow up in:  3 months.  You will receive a reminder letter in the mail one-two months in advance.  If you don't receive a letter, please call our office to schedule the follow up appointment   

## 2012-05-15 ENCOUNTER — Inpatient Hospital Stay (HOSPITAL_COMMUNITY)
Admission: EM | Admit: 2012-05-15 | Discharge: 2012-05-20 | DRG: 638 | Disposition: A | Discharge status: Nursing Home (Includes ICF) | Payer: Medicare Other | Attending: Internal Medicine | Admitting: Internal Medicine

## 2012-05-15 ENCOUNTER — Encounter (HOSPITAL_COMMUNITY): Payer: Self-pay | Admitting: Emergency Medicine

## 2012-05-15 ENCOUNTER — Emergency Department (HOSPITAL_COMMUNITY): Payer: Medicare Other

## 2012-05-15 DIAGNOSIS — D631 Anemia in chronic kidney disease: Secondary | ICD-10-CM | POA: Diagnosis present

## 2012-05-15 DIAGNOSIS — I129 Hypertensive chronic kidney disease with stage 1 through stage 4 chronic kidney disease, or unspecified chronic kidney disease: Secondary | ICD-10-CM | POA: Diagnosis present

## 2012-05-15 DIAGNOSIS — Z23 Encounter for immunization: Secondary | ICD-10-CM

## 2012-05-15 DIAGNOSIS — Z950 Presence of cardiac pacemaker: Secondary | ICD-10-CM

## 2012-05-15 DIAGNOSIS — E1169 Type 2 diabetes mellitus with other specified complication: Principal | ICD-10-CM | POA: Diagnosis present

## 2012-05-15 DIAGNOSIS — N189 Chronic kidney disease, unspecified: Secondary | ICD-10-CM | POA: Diagnosis present

## 2012-05-15 DIAGNOSIS — E039 Hypothyroidism, unspecified: Secondary | ICD-10-CM | POA: Diagnosis present

## 2012-05-15 DIAGNOSIS — Z9981 Dependence on supplemental oxygen: Secondary | ICD-10-CM

## 2012-05-15 DIAGNOSIS — E11621 Type 2 diabetes mellitus with foot ulcer: Secondary | ICD-10-CM

## 2012-05-15 DIAGNOSIS — G450 Vertebro-basilar artery syndrome: Secondary | ICD-10-CM | POA: Diagnosis present

## 2012-05-15 DIAGNOSIS — N289 Disorder of kidney and ureter, unspecified: Secondary | ICD-10-CM

## 2012-05-15 DIAGNOSIS — L03119 Cellulitis of unspecified part of limb: Secondary | ICD-10-CM | POA: Diagnosis present

## 2012-05-15 DIAGNOSIS — Z794 Long term (current) use of insulin: Secondary | ICD-10-CM

## 2012-05-15 DIAGNOSIS — I509 Heart failure, unspecified: Secondary | ICD-10-CM | POA: Diagnosis present

## 2012-05-15 DIAGNOSIS — I4892 Unspecified atrial flutter: Secondary | ICD-10-CM | POA: Diagnosis present

## 2012-05-15 DIAGNOSIS — K219 Gastro-esophageal reflux disease without esophagitis: Secondary | ICD-10-CM | POA: Diagnosis present

## 2012-05-15 DIAGNOSIS — I4891 Unspecified atrial fibrillation: Secondary | ICD-10-CM | POA: Diagnosis present

## 2012-05-15 DIAGNOSIS — S98919A Complete traumatic amputation of unspecified foot, level unspecified, initial encounter: Secondary | ICD-10-CM

## 2012-05-15 DIAGNOSIS — I2510 Atherosclerotic heart disease of native coronary artery without angina pectoris: Secondary | ICD-10-CM | POA: Diagnosis present

## 2012-05-15 DIAGNOSIS — I252 Old myocardial infarction: Secondary | ICD-10-CM

## 2012-05-15 DIAGNOSIS — L03116 Cellulitis of left lower limb: Secondary | ICD-10-CM

## 2012-05-15 DIAGNOSIS — F329 Major depressive disorder, single episode, unspecified: Secondary | ICD-10-CM | POA: Diagnosis present

## 2012-05-15 DIAGNOSIS — L02619 Cutaneous abscess of unspecified foot: Secondary | ICD-10-CM | POA: Diagnosis present

## 2012-05-15 DIAGNOSIS — H5440 Blindness, one eye, unspecified eye: Secondary | ICD-10-CM | POA: Diagnosis present

## 2012-05-15 DIAGNOSIS — Z885 Allergy status to narcotic agent status: Secondary | ICD-10-CM

## 2012-05-15 DIAGNOSIS — K3184 Gastroparesis: Secondary | ICD-10-CM | POA: Diagnosis present

## 2012-05-15 DIAGNOSIS — E785 Hyperlipidemia, unspecified: Secondary | ICD-10-CM | POA: Diagnosis present

## 2012-05-15 DIAGNOSIS — F3289 Other specified depressive episodes: Secondary | ICD-10-CM | POA: Diagnosis present

## 2012-05-15 DIAGNOSIS — I739 Peripheral vascular disease, unspecified: Secondary | ICD-10-CM | POA: Diagnosis present

## 2012-05-15 DIAGNOSIS — R509 Fever, unspecified: Secondary | ICD-10-CM

## 2012-05-15 DIAGNOSIS — L97509 Non-pressure chronic ulcer of other part of unspecified foot with unspecified severity: Secondary | ICD-10-CM | POA: Diagnosis present

## 2012-05-15 DIAGNOSIS — Z79899 Other long term (current) drug therapy: Secondary | ICD-10-CM

## 2012-05-15 DIAGNOSIS — R269 Unspecified abnormalities of gait and mobility: Secondary | ICD-10-CM | POA: Diagnosis present

## 2012-05-15 DIAGNOSIS — Z8673 Personal history of transient ischemic attack (TIA), and cerebral infarction without residual deficits: Secondary | ICD-10-CM

## 2012-05-15 DIAGNOSIS — E860 Dehydration: Secondary | ICD-10-CM

## 2012-05-15 HISTORY — DX: Dependence on supplemental oxygen: Z99.81

## 2012-05-15 LAB — COMPREHENSIVE METABOLIC PANEL
Albumin: 3.3 g/dL — ABNORMAL LOW (ref 3.5–5.2)
Alkaline Phosphatase: 98 U/L (ref 39–117)
BUN: 40 mg/dL — ABNORMAL HIGH (ref 6–23)
Potassium: 4.5 mEq/L (ref 3.5–5.1)
Total Protein: 7.3 g/dL (ref 6.0–8.3)

## 2012-05-15 LAB — GLUCOSE, CAPILLARY: Glucose-Capillary: 293 mg/dL — ABNORMAL HIGH (ref 70–99)

## 2012-05-15 LAB — CBC WITH DIFFERENTIAL/PLATELET
Basophils Absolute: 0 10*3/uL (ref 0.0–0.1)
Basophils Relative: 0 % (ref 0–1)
Eosinophils Absolute: 0.1 10*3/uL (ref 0.0–0.7)
MCH: 29.1 pg (ref 26.0–34.0)
MCHC: 33.2 g/dL (ref 30.0–36.0)
Monocytes Relative: 9 % (ref 3–12)
Neutrophils Relative %: 76 % (ref 43–77)
Platelets: 222 10*3/uL (ref 150–400)
RDW: 14.6 % (ref 11.5–15.5)

## 2012-05-15 LAB — URINALYSIS, ROUTINE W REFLEX MICROSCOPIC
Ketones, ur: NEGATIVE mg/dL
Leukocytes, UA: NEGATIVE
Nitrite: NEGATIVE
Protein, ur: NEGATIVE mg/dL
pH: 6 (ref 5.0–8.0)

## 2012-05-15 LAB — TROPONIN I: Troponin I: <0.3 ng/mL (ref <0.30)

## 2012-05-15 LAB — LACTIC ACID, PLASMA: Lactic Acid, Venous: 1 mmol/L (ref 0.5–2.2)

## 2012-05-15 LAB — LIPASE, BLOOD: Lipase: 17 U/L (ref 11–59)

## 2012-05-15 MED ORDER — ACETAMINOPHEN 500 MG PO TABS
500.0000 mg | ORAL_TABLET | Freq: Four times a day (QID) | ORAL | Status: DC | PRN
  Administered 2012-05-17 – 2012-05-18 (×3): 500 mg via ORAL
  Filled 2012-05-15 (×3): qty 1

## 2012-05-15 MED ORDER — INSULIN ASPART 100 UNIT/ML ~~LOC~~ SOLN: 5.0000 [IU] | Freq: Every day | SUBCUTANEOUS | Status: DC

## 2012-05-15 MED ORDER — ACETAMINOPHEN 500 MG PO TABS
1000.0000 mg | ORAL_TABLET | Freq: Once | ORAL | Status: AC
  Administered 2012-05-15: 1000 mg via ORAL
  Filled 2012-05-15: qty 2

## 2012-05-15 MED ORDER — VANCOMYCIN HCL 1000 MG IV SOLR
750.0000 mg | INTRAVENOUS | Status: DC
  Administered 2012-05-16 – 2012-05-18 (×3): 750 mg via INTRAVENOUS
  Filled 2012-05-15 (×3): qty 750

## 2012-05-15 MED ORDER — ENOXAPARIN SODIUM 30 MG/0.3ML ~~LOC~~ SOLN
30.0000 mg | Freq: Every day | SUBCUTANEOUS | Status: DC
Start: 2012-05-15 — End: 2012-05-15

## 2012-05-15 MED ORDER — TRAVOPROST (BAK FREE) 0.004 % OP SOLN
1.0000 [drp] | Freq: Every day | OPHTHALMIC | Status: DC
  Administered 2012-05-16 – 2012-05-19 (×4): 1 [drp] via OPHTHALMIC
  Filled 2012-05-15 (×9): qty 0.1

## 2012-05-15 MED ORDER — SODIUM CHLORIDE 0.9 % IV SOLN: INTRAVENOUS | Status: AC

## 2012-05-15 MED ORDER — INSULIN GLARGINE 100 UNIT/ML ~~LOC~~ SOLN: 15.0000 [IU] | Freq: Every day | SUBCUTANEOUS | Status: DC

## 2012-05-15 MED ORDER — LISINOPRIL 10 MG PO TABS
10.0000 mg | ORAL_TABLET | Freq: Every day | ORAL | Status: DC
  Administered 2012-05-16 – 2012-05-20 (×5): 10 mg via ORAL
  Filled 2012-05-15 (×5): qty 1

## 2012-05-15 MED ORDER — SODIUM CHLORIDE 0.9 % IV SOLN
INTRAVENOUS | Status: DC
  Administered 2012-05-15 – 2012-05-19 (×6): via INTRAVENOUS

## 2012-05-15 MED ORDER — FUROSEMIDE 40 MG PO TABS: 40.0000 mg | ORAL_TABLET | Freq: Two times a day (BID) | ORAL | Status: DC

## 2012-05-15 MED ORDER — INSULIN ASPART 100 UNIT/ML ~~LOC~~ SOLN
0.0000 [IU] | Freq: Three times a day (TID) | SUBCUTANEOUS | Status: DC
  Administered 2012-05-16 (×2): 3 [IU] via SUBCUTANEOUS
  Administered 2012-05-17: 11 [IU] via SUBCUTANEOUS
  Administered 2012-05-18: 2 [IU] via SUBCUTANEOUS
  Administered 2012-05-18 (×2): 3 [IU] via SUBCUTANEOUS
  Administered 2012-05-19: 8 [IU] via SUBCUTANEOUS
  Administered 2012-05-19: 2 [IU] via SUBCUTANEOUS
  Administered 2012-05-19 – 2012-05-20 (×2): 3 [IU] via SUBCUTANEOUS
  Administered 2012-05-20: 8 [IU] via SUBCUTANEOUS

## 2012-05-15 MED ORDER — ENOXAPARIN SODIUM 30 MG/0.3ML ~~LOC~~ SOLN
30.0000 mg | Freq: Every day | SUBCUTANEOUS | Status: DC
  Administered 2012-05-15 – 2012-05-16 (×2): 30 mg via SUBCUTANEOUS
  Filled 2012-05-15 (×2): qty 0.3

## 2012-05-15 MED ORDER — SERTRALINE HCL 50 MG PO TABS
100.0000 mg | ORAL_TABLET | Freq: Every day | ORAL | Status: DC
  Administered 2012-05-16 – 2012-05-20 (×5): 100 mg via ORAL
  Filled 2012-05-15 (×5): qty 2

## 2012-05-15 MED ORDER — INSULIN GLARGINE 100 UNIT/ML ~~LOC~~ SOLN
15.0000 [IU] | Freq: Every day | SUBCUTANEOUS | Status: DC
  Administered 2012-05-15 – 2012-05-19 (×5): 15 [IU] via SUBCUTANEOUS

## 2012-05-15 MED ORDER — LINAGLIPTIN 5 MG PO TABS
5.0000 mg | ORAL_TABLET | Freq: Every day | ORAL | Status: DC
  Administered 2012-05-16 – 2012-05-20 (×5): 5 mg via ORAL
  Filled 2012-05-15 (×5): qty 1

## 2012-05-15 MED ORDER — ONDANSETRON HCL 4 MG/2ML IJ SOLN: 4.0000 mg | Freq: Three times a day (TID) | INTRAMUSCULAR | Status: AC | PRN

## 2012-05-15 MED ORDER — LEVOTHYROXINE SODIUM 75 MCG PO TABS
75.0000 ug | ORAL_TABLET | Freq: Every day | ORAL | Status: DC
  Administered 2012-05-16 – 2012-05-20 (×5): 75 ug via ORAL
  Filled 2012-05-15 (×5): qty 1

## 2012-05-15 MED ORDER — VITAMIN C 500 MG PO TABS
1000.0000 mg | ORAL_TABLET | Freq: Every day | ORAL | Status: DC
  Administered 2012-05-16 – 2012-05-20 (×5): 1000 mg via ORAL
  Filled 2012-05-15 (×5): qty 2

## 2012-05-15 MED ORDER — VANCOMYCIN HCL IN DEXTROSE 1-5 GM/200ML-% IV SOLN
1000.0000 mg | Freq: Once | INTRAVENOUS | Status: AC
  Administered 2012-05-15: 1000 mg via INTRAVENOUS
  Filled 2012-05-15: qty 200

## 2012-05-15 MED ORDER — NITROGLYCERIN 0.4 MG SL SUBL: 0.4000 mg | SUBLINGUAL_TABLET | SUBLINGUAL | Status: DC | PRN

## 2012-05-15 NOTE — ED Provider Notes (Signed)
History     CSN: [id number]  Arrival date & time 05/15/12  1359   First MD Initiated Contact with Patient 05/15/12 1449      Chief Complaint  Patient presents with  . Diabetic Ulcer  . Fever     HPI Pt was seen at 1500.  Per pt, c/o gradual onset and worsening of persistent left plantar foot wounds/ulcers and redness for the past week, worse over the past several days.  Pt's daughter states pt was eval by her PMD 3 days ago for same, rx bactrim.  States her left foot has become more erythematous and pt is now having fevers since yesterday.  Denies tingling/numbness in extremities, no focal motor weakness, no abd pain, no CP/SOB.     Past Medical History  Diagnosis Date  . Hypertension   . Dyslipidemia   . CAD (coronary artery disease)     Non-STEMI August, 2010,,,, 40% left main, 30% LAD, 60% OM 2, 40% RCA, 90% OM1-culprit lesion-too small for intervention           . Ejection fraction     EF 55% ,2011 / EF 30% with tachycardia cardiomyopathy / EF 50% January, 2012  . Diabetes mellitus   . Dementia     ?? Early dementia ?? 2012  . GERD (gastroesophageal reflux disease)   . TIA (transient ischemic attack)     History of TIAs  . Hypothyroidism   . PAD (peripheral artery disease)     Carotid endarterectomy, right. Stents right peroneal, right superficial, right popliteal arteries, Dr. Edilia Bo  . Amputation     Right midfoot 2010  . CKD (chronic kidney disease)     Creatinine 1.19 at December 2012 discharge  . Gait disorder   . Vertebrobasilar insufficiency   . Depression   . Hyperkalemia     ACE Inhibitor held  . Acute renal insufficiency     Catheterization August 2010  . Blindness of right eye   . Respiratory failure     Hypoxic and hypercapnic, 2011, etiology pneumonia  . Orthostatic hypotension     Syncope January 2012 with facial trauma, amiodarone stopped  . Tachycardia induced cardiomyopathy     Previous EF 30%.  Most recently 55-60% by echo 08/14/11  . Atrial  fibrillation 8/10    on pradaxa, s/p AV nodal ablation  . Ventricular tachycardia     20 beats December, 2012, asymptomatic  . Atrial flutter 05/24/09    Hospitalization in [place]; with difficult rate control; TEE cardioversion done EF 30%, secondary to tachycardia, pt did convert back tosinus rhythm; return 10/11, amiodarone restarted 10/11, no longer on it. Recurrence with RVR 08/2011.  [name] Anemia in CKD (chronic kidney disease)   . Gastroparesis 3/11    Abnormal GES  . Drug therapy,  Sotolol     Sotolol started 08/2011.  . Drug therapy     Pradaxa January, 2013  . Complete heart block     s/p AV nodal ablation  . CHF (congestive heart failure)   . On home O2     prn    Past Surgical History  Procedure Date  . Total abdominal hysterectomy   . Cholecystectomy   . Carotid endarterectomy   . Amputation 2010    RIGHT MIDFOOT  . Tonsillectomy   . Eye surgeries     Several right  . Pacemaker insertion     SJM Pacemaker implant 9/10  . Pci stent to the right peroneal and right superficial as  well as right popliteal arteries     Dr. Edilia Bo  . Av nodal ablation 08/2011    by Dr Ladona Ridgel    Family History  Problem Relation Age of Onset  . Cancer Brother     Colon cancer  . Aortic aneurysm Mother   . Colon polyps Neg Hx   . Liver disease Neg Hx     And no CRC    History  Substance Use Topics  . Smoking status: Never Smoker   . Smokeless tobacco: Never Used  . Alcohol Use: No    Review of Systems ROS: Statement: All systems negative except as marked or noted in the HPI; Constitutional: +fever and chills. ; ; Eyes: Negative for eye pain, redness and discharge. ; ; ENMT: Negative for ear pain, hoarseness, nasal congestion, sinus pressure and sore throat. ; ; Cardiovascular: Negative for chest pain, palpitations, diaphoresis, dyspnea and peripheral edema. ; ; Respiratory: Negative for cough, wheezing and stridor. ; ; Gastrointestinal: Negative for nausea, vomiting,  diarrhea, abdominal pain, blood in stool, hematemesis, jaundice and rectal bleeding. . ; ; Genitourinary: Negative for dysuria, flank pain and hematuria. ; ; Musculoskeletal: Negative for back pain and neck pain. Negative for swelling and trauma.; ; Skin: +redness and ulcers to left foot. Negative for pruritus, abrasions, blisters, bruising.; ; Neuro: Negative for headache, lightheadedness and neck stiffness. Negative for weakness, altered level of consciousness , altered mental status, extremity weakness, paresthesias, involuntary movement, seizure and syncope.       Allergies  Amiodarone; Morphine sulfate; and Oxycodone hcl  Home Medications   Current Outpatient Rx  Name Route Sig Dispense Refill  . ACETAMINOPHEN 500 MG PO TABS Oral Take 500 mg by mouth every 6 (six) hours as needed. For pain    . VITAMIN C 1000 MG PO TABS Oral Take 1,000 mg by mouth daily.      . FUROSEMIDE 40 MG PO TABS Oral Take 1 tablet (40 mg total) by mouth 2 (two) times daily. 60 tablet 3    *dose increase* *D/C Sotalol*  . INSULIN ASPART 100 UNIT/ML [place] SOLN Subcutaneous Inject 5-9 Units into the skin 5 (five) times daily. Per sliding scale.  90-200: 5 units, 201-250: 6 units, 251-300: 7 units, 301-350: 8 units, 351-400: 9 units    . INSULIN GLARGINE 100 UNIT/ML [place] SOLN Subcutaneous Inject 15 Units into the skin at bedtime.      [name] LEVOTHYROXINE SODIUM 75 MCG PO TABS Oral Take 75 mcg by mouth daily.      [name] LINAGLIPTIN 5 MG PO TABS Oral Take 5 mg by mouth daily.    [name] LISINOPRIL 10 MG PO TABS Oral Take 1 tablet (10 mg total) by mouth daily. 30 tablet 3  . SERTRALINE HCL 100 MG PO TABS Oral Take 100 mg by mouth daily.      . SULFAMETHOXAZOLE-TMP DS 800-160 MG PO TABS Oral Take 1 tablet by mouth 2 (two) times daily.    . TRAVOPROST 0.004 % OP SOLN Both Eyes Place 1 drop into both eyes at bedtime.      [name] NITROGLYCERIN 0.4 MG SL SUBL Sublingual Place 0.4 mg under the tongue every 5 (five) minutes as needed. For chest pain       BP 154/76  Pulse 70  Temp 103.1 F (39.5 C) (Rectal)  Resp 16  Ht 5\' 7"  (1.702 m)  Wt 165 lb (74.844 kg)  BMI 25.84 kg/m2  SpO2 91%  Physical Exam 1505: Physical examination:  Nursing  notes reviewed; Vital signs and O2 SAT reviewed;  Constitutional: Well developed, Well nourished, Well hydrated, In no acute distress; Head:  Normocephalic, atraumatic; Eyes: EOMI, PERRL, No scleral icterus; ENMT: Mouth and pharynx normal, Mucous membranes moist; Neck: Supple, Full range of motion, No lymphadenopathy; Cardiovascular: Regular rate and rhythm, No murmur, rub, or gallop; Respiratory: Breath sounds clear & equal bilaterally, No rales, rhonchi, wheezes.  Speaking full sentences with ease, Normal respiratory effort/excursion; Chest: Nontender, Movement normal; Abdomen: Soft, Nontender, Nondistended, Normal bowel sounds;; Extremities: Pulses normal, No tenderness, No edema, No calf edema or asymmetry.; Neuro: AA&Ox3, Major CN grossly intact.  Speech clear. No gross focal motor or sensory deficits in extremities.; Skin: Color normal, Warm, Dry, +erythema to left plantar and dorsal lateral forefoot.  +approx 2cm diameter ulcer to left lateral plantar forefoot, 2 smaller shallow ulcers with erythema to left medial plantar forefoot.     ED Course  Procedures   MDM  MDM Reviewed: nursing note and vitals Reviewed previous: ECG Interpretation: ECG, labs and x-ray      Date: 05/15/2012  Rate: 70  Rhythm: Electronic pacemaker  QRS Axis: left  Intervals: normal  ST/T Wave abnormalities: normal  Conduction Disutrbances:nonspecific intraventricular conduction delay  Narrative Interpretation:   Old EKG Reviewed: unchanged; no significant changes from previous EKG dated 01/25/2012.   Results for orders placed during the hospital encounter of 05/15/12  CBC WITH DIFFERENTIAL      Component Value Range   WBC 14.3 (*) 4.0 - 10.5 K/uL   RBC 4.36  3.87 - 5.11 MIL/uL   Hemoglobin 12.7  12.0 - 15.0  g/dL   HCT 81.1  91.4 - 78.2 %   MCV 87.6  78.0 - 100.0 fL   MCH 29.1  26.0 - 34.0 pg   MCHC 33.2  30.0 - 36.0 g/dL   RDW 95.6  21.3 - 08.6 %   Platelets 222  150 - 400 K/uL   Neutrophils Relative 76  43 - 77 %   Neutro Abs 10.8 (*) 1.7 - 7.7 K/uL   Lymphocytes Relative 15  12 - 46 %   Lymphs Abs 2.1  0.7 - 4.0 K/uL   Monocytes Relative 9  3 - 12 %   Monocytes Absolute 1.3 (*) 0.1 - 1.0 K/uL   Eosinophils Relative 1  0 - 5 %   Eosinophils Absolute 0.1  0.0 - 0.7 K/uL   Basophils Relative 0  0 - 1 %   Basophils Absolute 0.0  0.0 - 0.1 K/uL  COMPREHENSIVE METABOLIC PANEL      Component Value Range   Sodium 131 (*) 135 - 145 mEq/L   Potassium 4.5  3.5 - 5.1 mEq/L   Chloride 94 (*) 96 - 112 mEq/L   CO2 26  19 - 32 mEq/L   Glucose, Bld 189 (*) 70 - 99 mg/dL   BUN 40 (*) 6 - 23 mg/dL   Creatinine, Ser 5.78 (*) 0.50 - 1.10 mg/dL   Calcium 9.6  8.4 - 46.9 mg/dL   Total Protein 7.3  6.0 - 8.3 g/dL   Albumin 3.3 (*) 3.5 - 5.2 g/dL   AST 36  0 - 37 U/L   ALT 21  0 - 35 U/L   Alkaline Phosphatase 98  39 - 117 U/L   Total Bilirubin 0.6  0.3 - 1.2 mg/dL   GFR calc non Af Amer 20 (*) >90 mL/min   GFR calc Af Amer 23 (*) >90 mL/min  LIPASE, BLOOD  Component Value Range   Lipase 17  11 - 59 U/L  LACTIC ACID, PLASMA      Component Value Range   Lactic Acid, Venous 1.0  0.5 - 2.2 mmol/L  PROCALCITONIN      Component Value Range   Procalcitonin 0.15    TROPONIN I      Component Value Range   Troponin I <0.30  <0.30 ng/mL  URINALYSIS, ROUTINE W REFLEX MICROSCOPIC      Component Value Range   Color, Urine YELLOW  YELLOW   APPearance CLEAR  CLEAR   Specific Gravity, Urine 1.015  1.005 - 1.030   pH 6.0  5.0 - 8.0   Glucose, UA NEGATIVE  NEGATIVE mg/dL   Hgb urine dipstick NEGATIVE  NEGATIVE   Bilirubin Urine NEGATIVE  NEGATIVE   Ketones, ur NEGATIVE  NEGATIVE mg/dL   Protein, ur NEGATIVE  NEGATIVE mg/dL   Urobilinogen, UA 0.2  0.0 - 1.0 mg/dL   Nitrite NEGATIVE  NEGATIVE    Leukocytes, UA NEGATIVE  NEGATIVE   Dg Chest 2 View 05/15/2012  *RADIOLOGY REPORT*  Clinical Data: Fever.  CHEST - 2 VIEW  Comparison: 01/24/2012.  Findings: The pacer wires are stable.  The cardiac silhouette, mediastinal and hilar contours are mildly prominent but unchanged. There is persistent central vascular congestion and possible mild interstitial edema.  No pleural effusion or focal infiltrate.  The bony thorax is intact.  IMPRESSION: Stable cardiac enlargement and central vascular congestion with possible mild interstitial edema.   Original Report Authenticated By: P. Loralie Champagne, M.D.    Dg Foot Complete Left 05/15/2012  *RADIOLOGY REPORT*  Clinical Data: Diabetic foot ulcers.  LEFT FOOT - COMPLETE 3+ VIEW  Comparison: None  Findings: The joint spaces are maintained.  No acute bony findings or destructive bony changes.  IMPRESSION: No plain film findings for osteomyelitis.   Original Report Authenticated By: P. Loralie Champagne, M.D.      Results for Brittney [name], Brittney [name] (MRN [id number]) as of 05/15/2012 16:28  Ref. Range 01/26/2012 04:46 01/27/2012 05:17 05/15/2012 15:20  BUN Latest Range: 6-23 mg/dL 31 (H) 32 (H) 40 (H)  Creatinine Latest Range: 0.50-1.10 mg/dL 9.52 (H) 8.41 (H) 3.24 (H)     1640:  APAP given for fever. IV vancomycin begun for cellulitis after BC x2.  Dx testing d/w pt and family.  Questions answered.  Verb understanding, agreeable to admit.  T/C to Dr. Felecia Shelling, case discussed, including:  HPI, pertinent PM/SHx, VS/PE, dx testing, ED course and treatment:  Agreeable to admit, requests to write temporary orders, obtain medical bed.   Laray Anger, DO 05/17/12 [phone]

## 2012-05-15 NOTE — Progress Notes (Signed)
ANTIBIOTIC CONSULT NOTE - INITIAL  Pharmacy Consult for Vancomycin Indication: cellulitis   Allergies  Allergen Reactions  . Amiodarone     Autonomic dysfunction  . Morphine Sulfate     REACTION: jerking  . Oxycodone Hcl Itching and Other (See Comments)    Feels weird    Patient Measurements: Height: 5\' 7"  (170.2 cm) Weight: 165 lb (74.844 kg) IBW/kg (Calculated) : 61.6    Vital Signs: Temp: 99.6 F (37.6 C) (09/07 1809) Temp src: Oral (09/07 1809) BP: 118/64 mmHg (09/07 1809) Pulse Rate: 70  (09/07 1809) Intake/Output from previous day:   Intake/Output from this shift:    Labs:  Basename 05/15/12 1520  WBC 14.3*  HGB 12.7  PLT 222  LABCREA --  CREATININE 2.25*   Estimated Creatinine Clearance: 21.8 ml/min (by C-G formula based on Cr of 2.25). No results found for this basename: VANCOTROUGH:2,VANCOPEAK:2,VANCORANDOM:2,GENTTROUGH:2,GENTPEAK:2,GENTRANDOM:2,TOBRATROUGH:2,TOBRAPEAK:2,TOBRARND:2,AMIKACINPEAK:2,AMIKACINTROU:2,AMIKACIN:2, in the last 72 hours   Microbiology: Recent Results (from the past 720 hour(s))  CULTURE, BLOOD (ROUTINE X 2)     Status: Normal (Preliminary result)   Collection Time   05/15/12  4:46 PM      Component Value Range Status Comment   Specimen Description BLOOD LEFT ARM   Final    Special Requests     Final    Value: BOTTLES DRAWN AEROBIC AND ANAEROBIC 8CC EACH BOTTLE   Culture PENDING   Incomplete    Report Status PENDING   Incomplete   CULTURE, BLOOD (ROUTINE X 2)     Status: Normal (Preliminary result)   Collection Time   05/15/12  4:51 PM      Component Value Range Status Comment   Specimen Description BLOOD LEFT HAND   Final    Special Requests     Final    Value: BOTTLES DRAWN AEROBIC AND ANAEROBIC 8CC EACH BOTTLE   Culture PENDING   Incomplete    Report Status PENDING   Incomplete     Medical History: Past Medical History  Diagnosis Date  . Hypertension   . Dyslipidemia   . CAD (coronary artery disease)     Non-STEMI  August, 2010,,,, 40% left main, 30% LAD, 60% OM 2, 40% RCA, 90% OM1-culprit lesion-too small for intervention           . Ejection fraction     EF 55% ,2011 / EF 30% with tachycardia cardiomyopathy / EF 50% January, 2012  . Diabetes mellitus   . Dementia     ?? Early dementia ?? 2012  . GERD (gastroesophageal reflux disease)   . TIA (transient ischemic attack)     History of TIAs  . Hypothyroidism   . PAD (peripheral artery disease)     Carotid endarterectomy, right. Stents right peroneal, right superficial, right popliteal arteries, Dr. Edilia Bo  . Amputation     Right midfoot 2010  . CKD (chronic kidney disease)     Creatinine 1.19 at December 2012 discharge  . Gait disorder   . Vertebrobasilar insufficiency   . Depression   . Hyperkalemia     ACE Inhibitor held  . Acute renal insufficiency     Catheterization August 2010  . Blindness of right eye   . Respiratory failure     Hypoxic and hypercapnic, 2011, etiology pneumonia  . Orthostatic hypotension     Syncope January 2012 with facial trauma, amiodarone stopped  . Tachycardia induced cardiomyopathy     Previous EF 30%.  Most recently 55-60% by echo 08/14/11  . Atrial fibrillation  8/10    on pradaxa, s/p AV nodal ablation  . Ventricular tachycardia     20 beats December, 2012, asymptomatic  . Atrial flutter 05/24/09    Hospitalization in [place]; with difficult rate control; TEE cardioversion done EF 30%, secondary to tachycardia, pt did convert back tosinus rhythm; return 10/11, amiodarone restarted 10/11, no longer on it. Recurrence with RVR 08/2011.  [name] Anemia in CKD (chronic kidney disease)   . Gastroparesis 3/11    Abnormal GES  . Drug therapy,  Sotolol     Sotolol started 08/2011.  . Drug therapy     Pradaxa January, 2013  . Complete heart block     s/p AV nodal ablation  . CHF (congestive heart failure)   . On home O2     prn    Medications:  Scheduled:    . sodium chloride   Intravenous STAT  .  acetaminophen  1,000 mg Oral Once  . enoxaparin (LOVENOX) injection  30 mg Subcutaneous QHS  . insulin aspart  5-9 Units Subcutaneous 5 X Daily  . insulin glargine  15 Units Subcutaneous QHS  . levothyroxine  75 mcg Oral QAC breakfast  . linagliptin  5 mg Oral Daily  . lisinopril  10 mg Oral Daily  . sertraline  100 mg Oral Daily  . travoprost (benzalkonium)  1 drop Both Eyes QHS  . vancomycin  750 mg Intravenous Q24H  . vancomycin  1,000 mg Intravenous Once  . vitamin C  1,000 mg Oral Daily  . DISCONTD: furosemide  40 mg Oral BID   Assessment: Okay for Protocol Received 1gm Vancomycin in ED Estimated Creatinine Clearance: 21.8 ml/min (by C-G formula based on Cr of 2.25). Cellulitis failed outpatient Bactrim.  Goal of Therapy:  Vancomycin trough level 10-15 mcg/ml  Plan:  Vancomycin 750mg  IV every 24 hours. Measure antibiotic drug levels at steady state Follow up culture results  Lovenox 30mg  SQ daily for VTE prophylaxis. Mady Gemma 05/15/2012,8:51 PM

## 2012-05-15 NOTE — ED Notes (Signed)
Pt has 2 diabetic ulcers to the left foot. Daughter states she only had one on Wed when she was seen for the same at her pcp. Pt was put on bactrim.

## 2012-05-16 LAB — GLUCOSE, CAPILLARY
Glucose-Capillary: 155 mg/dL — ABNORMAL HIGH (ref 70–99)
Glucose-Capillary: 191 mg/dL — ABNORMAL HIGH (ref 70–99)
Glucose-Capillary: 118 mg/dL — ABNORMAL HIGH (ref 70–99)
Glucose-Capillary: 277 mg/dL — ABNORMAL HIGH (ref 70–99)

## 2012-05-16 LAB — BASIC METABOLIC PANEL
BUN: 33 mg/dL — ABNORMAL HIGH (ref 6–23)
CO2: 30 mEq/L (ref 19–32)
GFR calc non Af Amer: 27 mL/min — ABNORMAL LOW (ref >90)
Glucose, Bld: 154 mg/dL — ABNORMAL HIGH (ref 70–99)
Potassium: 5.4 mEq/L — ABNORMAL HIGH (ref 3.5–5.1)

## 2012-05-16 LAB — CBC
HCT: 36.2 % (ref 36.0–46.0)
Hemoglobin: 11.8 g/dL — ABNORMAL LOW (ref 12.0–15.0)
MCH: 28.9 pg (ref 26.0–34.0)
MCHC: 32.6 g/dL (ref 30.0–36.0)

## 2012-05-16 MED ORDER — CHLORHEXIDINE GLUCONATE CLOTH 2 % EX PADS
6.0000 | MEDICATED_PAD | Freq: Every day | CUTANEOUS | Status: AC
  Administered 2012-05-16 – 2012-05-20 (×5): 6 via TOPICAL

## 2012-05-16 MED ORDER — MUPIROCIN 2 % EX OINT
1.0000 "application " | TOPICAL_OINTMENT | Freq: Two times a day (BID) | CUTANEOUS | Status: DC
  Administered 2012-05-16 – 2012-05-20 (×9): 1 via NASAL
  Filled 2012-05-16: qty 22

## 2012-05-16 NOTE — Progress Notes (Signed)
Subjective: Patient feels better. She is getting IV fluid and IV antibiotics.  Objective: Vital signs in last 24 hours: Temp:  [97 F (36.1 C)-103.1 F (39.5 C)] 97 F (36.1 C) (09/08 0540) Pulse Rate:  [69-70] 70  (09/08 0540) Resp:  [16-20] 20  (09/08 0540) BP: (118-157)/(64-76) 157/69 mmHg (09/08 0540) SpO2:  [91 %-99 %] 98 % (09/08 0540) Weight:  [74.844 kg (165 lb)] 74.844 kg (165 lb) (09/07 1809) Weight change:  Last BM Date: 05/14/12  Intake/Output from previous day: 09/07 0701 - 09/08 0700 In: 910 [I.V.:910] Out: -   PHYSICAL EXAM General appearance: alert, cooperative and no distress Resp: clear to auscultation bilaterally Cardio: regular rate and rhythm, S1, S2 normal, no murmur, click, rub or gallop GI: soft, non-tender; bowel sounds normal; no masses,  no organomegaly Extremities: 3 small wounds on the lt foot, S/P ampuation of the rt foot  Lab Results:    @labtest @ [facility] No results found for this basename: PHART,PCO2,PO2ART,TCO2,HCO3 in the last 72 hours CULTURES Recent Results (from the past 240 hour(s))  CULTURE, BLOOD (ROUTINE X 2)     Status: Normal (Preliminary result)   Collection Time   05/15/12  4:46 PM      Component Value Range Status Comment   Specimen Description BLOOD LEFT ARM   Final    Special Requests     Final    Value: BOTTLES DRAWN AEROBIC AND ANAEROBIC 8CC EACH BOTTLE   Culture PENDING   Incomplete    Report Status PENDING   Incomplete   CULTURE, BLOOD (ROUTINE X 2)     Status: Normal (Preliminary result)   Collection Time   05/15/12  4:51 PM      Component Value Range Status Comment   Specimen Description BLOOD LEFT HAND   Final    Special Requests     Final    Value: BOTTLES DRAWN AEROBIC AND ANAEROBIC 8CC EACH BOTTLE   Culture PENDING   Incomplete    Report Status PENDING   Incomplete   MRSA PCR SCREENING     Status: Abnormal   Collection Time   05/15/12 10:20 PM      Component Value Range Status Comment   MRSA by PCR POSITIVE  (*) NEGATIVE Final    Studies/Results: Dg Chest 2 View  05/15/2012  *RADIOLOGY REPORT*  Clinical Data: Fever.  CHEST - 2 VIEW  Comparison: 01/24/2012.  Findings: The pacer wires are stable.  The cardiac silhouette, mediastinal and hilar contours are mildly prominent but unchanged. There is persistent central vascular congestion and possible mild interstitial edema.  No pleural effusion or focal infiltrate.  The bony thorax is intact.  IMPRESSION: Stable cardiac enlargement and central vascular congestion with possible mild interstitial edema.   Original Report Authenticated By: P. Loralie Champagne, M.D.    Dg Foot Complete Left  05/15/2012  *RADIOLOGY REPORT*  Clinical Data: Diabetic foot ulcers.  LEFT FOOT - COMPLETE 3+ VIEW  Comparison: None  Findings: The joint spaces are maintained.  No acute bony findings or destructive bony changes.  IMPRESSION: No plain film findings for osteomyelitis.   Original Report Authenticated By: P. Loralie Champagne, M.D.     Medications: I have reviewed the patient's current medications.  Assesment: 1. Lt foot ulcers with cellulitis  2. Diabetes Mellitus  3.H/O CAD  4. CRF  5.Hypertension  Active Problems:  * No active hospital problems. *     Plan: Continue iv antibiotics Continue iv hydration We will monitor BMP Wound  care.    LOS: 1 day   Synai Prettyman 05/16/2012, 10:57 AM

## 2012-05-16 NOTE — H&P (Signed)
Brittney [name] MRN: [id number] DOB/AGE: Jun 17, 1932 78 y.o. Primary Care Physician:Georgenia Salim, MD Admit date: 05/15/2012 Chief Complaint: Lt foot ulcer, redness and pain HPI:  This is a 78 years old female patient with history of multiple medical illness including diabetes mellitus came to Er with the above complaint. She was seen by her endocrinologist on 05/13/12 for lt foot ulcer and was start on septra Ds but the patient noticed worsening the wound with redness and pain of the wound. She was seen in ER. Her blood test also showed worsening of her renal function. She had low grade chills. No headache, cough, chest pain, nausea, vomiting. Abdominal pain, dysuria, urgency or frequency of urination.  Past Medical History  Diagnosis Date  . Hypertension   . Dyslipidemia   . CAD (coronary artery disease)     Non-STEMI August, 2010,,,, 40% left main, 30% LAD, 60% OM 2, 40% RCA, 90% OM1-culprit lesion-too small for intervention           . Ejection fraction     EF 55% ,2011 / EF 30% with tachycardia cardiomyopathy / EF 50% January, 2012  . Diabetes mellitus   . Dementia     ?? Early dementia ?? 2012  . GERD (gastroesophageal reflux disease)   . TIA (transient ischemic attack)     History of TIAs  . Hypothyroidism   . PAD (peripheral artery disease)     Carotid endarterectomy, right. Stents right peroneal, right superficial, right popliteal arteries, Dr. Edilia Bo  . Amputation     Right midfoot 2010  . CKD (chronic kidney disease)     Creatinine 1.19 at December 2012 discharge  . Gait disorder   . Vertebrobasilar insufficiency   . Depression   . Hyperkalemia     ACE Inhibitor held  . Acute renal insufficiency     Catheterization August 2010  . Blindness of right eye   . Respiratory failure     Hypoxic and hypercapnic, 2011, etiology pneumonia  . Orthostatic hypotension     Syncope January 2012 with facial trauma, amiodarone stopped  . Tachycardia induced cardiomyopathy     Previous EF  30%.  Most recently 55-60% by echo 08/14/11  . Atrial fibrillation 8/10    on pradaxa, s/p AV nodal ablation  . Ventricular tachycardia     20 beats December, 2012, asymptomatic  . Atrial flutter 05/24/09    Hospitalization in [place]; with difficult rate control; TEE cardioversion done EF 30%, secondary to tachycardia, pt did convert back tosinus rhythm; return 10/11, amiodarone restarted 10/11, no longer on it. Recurrence with RVR 08/2011.  [name] Anemia in CKD (chronic kidney disease)   . Gastroparesis 3/11    Abnormal GES  . Drug therapy,  Sotolol     Sotolol started 08/2011.  . Drug therapy     Pradaxa January, 2013  . Complete heart block     s/p AV nodal ablation  . CHF (congestive heart failure)   . On home O2     prn   Past Surgical History  Procedure Date  . Total abdominal hysterectomy   . Cholecystectomy   . Carotid endarterectomy   . Amputation 2010    RIGHT MIDFOOT  . Tonsillectomy   . Eye surgeries     Several right  . Pacemaker insertion     SJM Pacemaker implant 9/10  . Pci stent to the right peroneal and right superficial as well as right popliteal arteries     Dr. Edilia Bo  . Av  nodal ablation 08/2011    by Dr Ladona Ridgel        Family History  Problem Relation Age of Onset  . Cancer Brother     Colon cancer  . Aortic aneurysm Mother   . Colon polyps Neg Hx   . Liver disease Neg Hx     And no CRC    Social History:  reports that she has never smoked. She has never used smokeless tobacco. She reports that she does not drink alcohol or use illicit drugs.   Allergies:  Allergies  Allergen Reactions  . Amiodarone     Autonomic dysfunction  . Morphine Sulfate     REACTION: jerking  . Oxycodone Hcl Itching and Other (See Comments)    Feels weird    Medications Prior to Admission  Medication Sig Dispense Refill  . acetaminophen (TYLENOL) 500 MG tablet Take 500 mg by mouth every 6 (six) hours as needed. For pain      . Ascorbic Acid (VITAMIN C)  1000 MG tablet Take 1,000 mg by mouth daily.        . furosemide (LASIX) 40 MG tablet Take 1 tablet (40 mg total) by mouth 2 (two) times daily.  60 tablet  3  . insulin aspart (NOVOLOG) 100 UNIT/ML injection Inject 5-9 Units into the skin 5 (five) times daily. Per sliding scale.  90-200: 5 units, 201-250: 6 units, 251-300: 7 units, 301-350: 8 units, 351-400: 9 units      . insulin glargine (LANTUS) 100 UNIT/ML injection Inject 15 Units into the skin at bedtime.        [name] levothyroxine (SYNTHROID, LEVOTHROID) 75 MCG tablet Take 75 mcg by mouth daily.        [name] linagliptin (TRADJENTA) 5 MG TABS tablet Take 5 mg by mouth daily.      [name] lisinopril (PRINIVIL) 10 MG tablet Take 1 tablet (10 mg total) by mouth daily.  30 tablet  3  . sertraline (ZOLOFT) 100 MG tablet Take 100 mg by mouth daily.        [name] sulfamethoxazole-trimethoprim (BACTRIM DS) 800-160 MG per tablet Take 1 tablet by mouth 2 (two) times daily.      . travoprost, benzalkonium, (TRAVATAN) 0.004 % ophthalmic solution Place 1 drop into both eyes at bedtime.        . nitroGLYCERIN (NITROSTAT) 0.4 MG SL tablet Place 0.4 mg under the tongue every 5 (five) minutes as needed. For chest pain           ZOX:WRUEA from the symptoms mentioned above,there are no other symptoms referable to all systems reviewed.  Physical Exam: Blood pressure 157/69, pulse 70, temperature 97 F (36.1 C), temperature source Oral, resp. rate 20, height 5\' 7"  (1.702 m), weight 74.844 kg (165 lb), SpO2 98.00%. General Condition- alert and awake and pleasant looking HE ENT- Pupils equal and reactive Respiratory- Clear lung field CVS- S1 & S2 heard, no murmur or gallop ABD- soft and lax, bowel sound ++ EXT- 3 ulcerations on the lt planter side with redness, tenderness,          S/p amputation of rt partial amputation of the foot   Basename 05/16/12 0605 05/15/12 1520  WBC 11.6* 14.3*  NEUTROABS -- 10.8*  HGB 11.8* 12.7  HCT 36.2 38.2  MCV 88.5 87.6  PLT 221 222      Basename 05/16/12 0605 05/15/12 1520  NA 140 131*  K 5.4* 4.5  CL 103 94*  CO2 30 26  GLUCOSE  154* 189*  BUN 33* 40*  CREATININE 1.72* 2.25*  CALCIUM 9.5 9.6  MG -- --  lablast2(ast:2,ALT:2,alkphos:2,bilitot:2,prot:2,albumin:2)@    Recent Results (from the past 240 hour(s))  CULTURE, BLOOD (ROUTINE X 2)     Status: Normal (Preliminary result)   Collection Time   05/15/12  4:46 PM      Component Value Range Status Comment   Specimen Description BLOOD LEFT ARM   Final    Special Requests     Final    Value: BOTTLES DRAWN AEROBIC AND ANAEROBIC 8CC EACH BOTTLE   Culture PENDING   Incomplete    Report Status PENDING   Incomplete   CULTURE, BLOOD (ROUTINE X 2)     Status: Normal (Preliminary result)   Collection Time   05/15/12  4:51 PM      Component Value Range Status Comment   Specimen Description BLOOD LEFT HAND   Final    Special Requests     Final    Value: BOTTLES DRAWN AEROBIC AND ANAEROBIC 8CC EACH BOTTLE   Culture PENDING   Incomplete    Report Status PENDING   Incomplete   MRSA PCR SCREENING     Status: Abnormal   Collection Time   05/15/12 10:20 PM      Component Value Range Status Comment   MRSA by PCR POSITIVE (*) NEGATIVE Final      Dg Chest 2 View  05/15/2012  *RADIOLOGY REPORT*  Clinical Data: Fever.  CHEST - 2 VIEW  Comparison: 01/24/2012.  Findings: The pacer wires are stable.  The cardiac silhouette, mediastinal and hilar contours are mildly prominent but unchanged. There is persistent central vascular congestion and possible mild interstitial edema.  No pleural effusion or focal infiltrate.  The bony thorax is intact.  IMPRESSION: Stable cardiac enlargement and central vascular congestion with possible mild interstitial edema.   Original Report Authenticated By: P. Loralie Champagne, M.D.    Dg Foot Complete Left  05/15/2012  *RADIOLOGY REPORT*  Clinical Data: Diabetic foot ulcers.  LEFT FOOT - COMPLETE 3+ VIEW  Comparison: None  Findings: The joint spaces are  maintained.  No acute bony findings or destructive bony changes.  IMPRESSION: No plain film findings for osteomyelitis.   Original Report Authenticated By: P. Loralie Champagne, M.D.    Impression: 1. Lt foot ulcers with cellulitis 2. Diabetes Mellitus 3.H/O CAD 4. CRF 5.Hypertension  Active Problems:  * No active hospital problems. *      Plan: -continue iv Vancomycin -wound care - iv hydration - continue regular treatment      Sidharth Leverette Pager [phone]  05/16/2012, 10:19 AM

## 2012-05-17 LAB — GLUCOSE, CAPILLARY
Glucose-Capillary: 120 mg/dL — ABNORMAL HIGH (ref 70–99)
Glucose-Capillary: 316 mg/dL — ABNORMAL HIGH (ref 70–99)
Glucose-Capillary: 61 mg/dL — ABNORMAL LOW (ref 70–99)
Glucose-Capillary: 220 mg/dL — ABNORMAL HIGH (ref 70–99)

## 2012-05-17 LAB — BASIC METABOLIC PANEL
BUN: 26 mg/dL — ABNORMAL HIGH (ref 6–23)
Calcium: 9.1 mg/dL (ref 8.4–10.5)
Chloride: 104 mEq/L (ref 96–112)
Creatinine, Ser: 1.4 mg/dL — ABNORMAL HIGH (ref 0.50–1.10)
GFR calc Af Amer: 41 mL/min — ABNORMAL LOW (ref >90)
GFR calc non Af Amer: 35 mL/min — ABNORMAL LOW (ref >90)

## 2012-05-17 MED ORDER — PRO-STAT SUGAR FREE PO LIQD
30.0000 mL | Freq: Two times a day (BID) | ORAL | Status: DC
  Administered 2012-05-17 – 2012-05-20 (×6): 30 mL via ORAL
  Filled 2012-05-17 (×6): qty 30

## 2012-05-17 MED ORDER — ENOXAPARIN SODIUM 40 MG/0.4ML ~~LOC~~ SOLN
40.0000 mg | Freq: Every day | SUBCUTANEOUS | Status: DC
  Administered 2012-05-17 – 2012-05-19 (×3): 40 mg via SUBCUTANEOUS
  Filled 2012-05-17 (×3): qty 0.4

## 2012-05-17 NOTE — Clinical Social Work Note (Signed)
MD: please consult PT and OT when appropriate to further assess d/c disposition- ?return to [facility] -vs- SNF/Chantalle Defilippo Elijah Birk, MSW, [employer]  [phone]

## 2012-05-17 NOTE — Progress Notes (Signed)
Subjective: Patient feels better. She no pain and redness of her lt foot.  Objective: Vital signs in last 24 hours: Temp:  [98 F (36.7 C)-101.1 F (38.4 C)] 98 F (36.7 C) (09/09 0500) Pulse Rate:  [70-71] 70  (09/09 0500) Resp:  [18-20] 18  (09/09 0500) BP: (127-154)/(69-70) 127/69 mmHg (09/09 0500) SpO2:  [92 %-94 %] 94 % (09/09 0500) Weight change:  Last BM Date: 05/14/12  Intake/Output from previous day:    PHYSICAL EXAM General appearance: alert, cooperative and no distress Resp: clear to auscultation bilaterally Cardio: regular rate and rhythm, S1, S2 normal, no murmur, click, rub or gallop GI: soft, non-tender; bowel sounds normal; no masses,  no organomegaly Extremities: 3 small wounds on the lt foot, S/P ampuation of the rt foot  Lab Results:    @labtest @ [facility] No results found for this basename: PHART,PCO2,PO2ART,TCO2,HCO3 in the last 72 hours CULTURES Recent Results (from the past 240 hour(s))  URINE CULTURE     Status: Normal   Collection Time   05/15/12  3:10 PM      Component Value Range Status Comment   Specimen Description URINE, CLEAN CATCH   Final    Special Requests NONE   Final    Culture  Setup Time 05/15/2012 20:48   Final    Colony Count 5,000 COLONIES/ML   Final    Culture INSIGNIFICANT GROWTH   Final    Report Status 05/16/2012 FINAL   Final   CULTURE, BLOOD (ROUTINE X 2)     Status: Normal (Preliminary result)   Collection Time   05/15/12  4:46 PM      Component Value Range Status Comment   Specimen Description BLOOD LEFT ARM   Final    Special Requests     Final    Value: BOTTLES DRAWN AEROBIC AND ANAEROBIC 8CC EACH BOTTLE   Culture NO GROWTH 1 DAY   Final    Report Status PENDING   Incomplete   CULTURE, BLOOD (ROUTINE X 2)     Status: Normal (Preliminary result)   Collection Time   05/15/12  4:51 PM      Component Value Range Status Comment   Specimen Description BLOOD LEFT HAND   Final    Special Requests     Final    Value: BOTTLES  DRAWN AEROBIC AND ANAEROBIC 8CC EACH BOTTLE   Culture NO GROWTH 1 DAY   Final    Report Status PENDING   Incomplete   MRSA PCR SCREENING     Status: Abnormal   Collection Time   05/15/12 10:20 PM      Component Value Range Status Comment   MRSA by PCR POSITIVE (*) NEGATIVE Final    Studies/Results: Dg Chest 2 View  05/15/2012  *RADIOLOGY REPORT*  Clinical Data: Fever.  CHEST - 2 VIEW  Comparison: 01/24/2012.  Findings: The pacer wires are stable.  The cardiac silhouette, mediastinal and hilar contours are mildly prominent but unchanged. There is persistent central vascular congestion and possible mild interstitial edema.  No pleural effusion or focal infiltrate.  The bony thorax is intact.  IMPRESSION: Stable cardiac enlargement and central vascular congestion with possible mild interstitial edema.   Original Report Authenticated By: P. Loralie Champagne, M.D.    Dg Foot Complete Left  05/15/2012  *RADIOLOGY REPORT*  Clinical Data: Diabetic foot ulcers.  LEFT FOOT - COMPLETE 3+ VIEW  Comparison: None  Findings: The joint spaces are maintained.  No acute bony findings or destructive bony changes.  IMPRESSION: No plain film findings for osteomyelitis.   Original Report Authenticated By: P. Loralie Champagne, M.D.     Medications: I have reviewed the patient's current medications.  Assesment: 1. Lt foot ulcers with cellulitis - improving  2. Diabetes Mellitus  3.H/O CAD  4. CRF- improving  5.Hypertension  Active Problems:  * No active hospital problems. *     Plan: Continue iv antibiotics Continue iv hydration We will monitor BMP Wound care.    LOS: 2 days   Bhavik Cabiness 05/17/2012, 7:35 AM

## 2012-05-17 NOTE — Progress Notes (Signed)
Hypoglycemia protocol started. Patient with blood sugar of 61 given cup of orange juice. Blood sugar to be rechecked.

## 2012-05-17 NOTE — Clinical Social Work Psychosocial (Signed)
     Clinical Social Work Department BRIEF PSYCHOSOCIAL ASSESSMENT 05/17/2012  Patient:  Brittney [name], Brittney [name]     Account Number:  [id number]     Admit date:  05/15/2012  Clinical Social Worker:  Robin Searing  Date/Time:  05/17/2012 10:44 AM  Referred by:  RN  Date Referred:  05/17/2012 Referred for  ALF Placement   Other Referral:   Interview type:  Patient Other interview type:   DAUGHTER [phone] DONNA    PSYCHOSOCIAL DATA Living Status:  FACILITY Admitted from facility:  OTHER Level of care:  Independent Living Primary support name:  ALF STAFF AND Primary support relationship to patient:  FAMILY Degree of support available:   GOOD    CURRENT CONCERNS Current Concerns  Post-Acute Placement   Other Concerns:   Patient admitted from [facility] Ind living - will assess for appropriateness of return -vs- higher level of care based on WOC and PT recommendations-    SOCIAL WORK ASSESSMENT / PLAN Patient and daughter are hopeful she can return to [place] at d/c however she may need a higher level- will await WOC and PT needs-   Assessment/plan status:  Other - See comment Other assessment/ plan:   Information/referral to community resources:   ALF, SNF lists  [facility]    PATIENTS/FAMILYS RESPONSE TO PLAN OF CARE: Patient and family appreciative and agreeable to plans- patient states she is feeling much better and is smiling- seems relieved by the current treatments.

## 2012-05-17 NOTE — Progress Notes (Signed)
INITIAL ADULT NUTRITION ASSESSMENT Date: 05/17/2012   Time: 1:10 PM Reason for Assessment: diabetic foot ulcers  INTERVENTION: Add Prostat BID. Agree with Inpatient Diabetes Coordinator's recommendation to add CHO Modified component to diet order.   ASSESSMENT: Female 78 y.o.  Dx: <principal problem not specified>  Hx:  Past Medical History  Diagnosis Date  . Hypertension   . Dyslipidemia   . CAD (coronary artery disease)     Non-STEMI August, 2010,,,, 40% left main, 30% LAD, 60% OM 2, 40% RCA, 90% OM1-culprit lesion-too small for intervention           . Ejection fraction     EF 55% ,2011 / EF 30% with tachycardia cardiomyopathy / EF 50% January, 2012  . Diabetes mellitus   . Dementia     ?? Early dementia ?? 2012  . GERD (gastroesophageal reflux disease)   . TIA (transient ischemic attack)     History of TIAs  . Hypothyroidism   . PAD (peripheral artery disease)     Carotid endarterectomy, right. Stents right peroneal, right superficial, right popliteal arteries, Dr. Edilia Bo  . Amputation     Right midfoot 2010  . CKD (chronic kidney disease)     Creatinine 1.19 at December 2012 discharge  . Gait disorder   . Vertebrobasilar insufficiency   . Depression   . Hyperkalemia     ACE Inhibitor held  . Acute renal insufficiency     Catheterization August 2010  . Blindness of right eye   . Respiratory failure     Hypoxic and hypercapnic, 2011, etiology pneumonia  . Orthostatic hypotension     Syncope January 2012 with facial trauma, amiodarone stopped  . Tachycardia induced cardiomyopathy     Previous EF 30%.  Most recently 55-60% by echo 08/14/11  . Atrial fibrillation 8/10    on pradaxa, s/p AV nodal ablation  . Ventricular tachycardia     20 beats December, 2012, asymptomatic  . Atrial flutter 05/24/09    Hospitalization in [place]; with difficult rate control; TEE cardioversion done EF 30%, secondary to tachycardia, pt did convert back tosinus rhythm; return 10/11,  amiodarone restarted 10/11, no longer on it. Recurrence with RVR 08/2011.  [name] Anemia in CKD (chronic kidney disease)   . Gastroparesis 3/11    Abnormal GES  . Drug therapy,  Sotolol     Sotolol started 08/2011.  . Drug therapy     Pradaxa January, 2013  . Complete heart block     s/p AV nodal ablation  . CHF (congestive heart failure)   . On home O2     prn   Past Surgical History  Procedure Date  . Total abdominal hysterectomy   . Cholecystectomy   . Carotid endarterectomy   . Amputation 2010    RIGHT MIDFOOT  . Tonsillectomy   . Eye surgeries     Several right  . Pacemaker insertion     SJM Pacemaker implant 9/10  . Pci stent to the right peroneal and right superficial as well as right popliteal arteries     Dr. Edilia Bo  . Av nodal ablation 08/2011    by Dr Ladona Ridgel   Related Meds:  Scheduled Meds:   . Chlorhexidine Gluconate Cloth  6 each Topical Q0600  . enoxaparin (LOVENOX) injection  40 mg Subcutaneous QHS  . insulin aspart  0-15 Units Subcutaneous TID WC  . insulin glargine  15 Units Subcutaneous QHS  . levothyroxine  75 mcg Oral QAC breakfast  .  linagliptin  5 mg Oral Daily  . lisinopril  10 mg Oral Daily  . mupirocin ointment  1 application Nasal BID  . sertraline  100 mg Oral Daily  . Travoprost (BAK Free)  1 drop Both Eyes QHS  . vancomycin  750 mg Intravenous Q24H  . vitamin C  1,000 mg Oral Daily  . DISCONTD: enoxaparin (LOVENOX) injection  30 mg Subcutaneous QHS   Continuous Infusions:   . sodium chloride 75 mL/hr at 05/17/12 0624   PRN Meds:.acetaminophen, nitroGLYCERIN  Ht: 5\' 7"  (170.2 cm)  Wt: 165 lb (74.844 kg)  Ideal Wt: 61.6 kg  % Ideal Wt: 122%  Usual Wt: 165# % Usual Wt: 100%  Body mass index is 25.84 kg/(m^2). Classified as overweight.   Food/Nutrition Related Hx: Pt is HOH. Obtained hx from both pt and pt daughter, [name]. Pt is a resident of [facility]. Pt denies weight loss. They report they noticed the foot  ulcers on Wednesday of last week. S/p wound consult. Pt reports good appetite; states she ate 100% of her lunch. She denies hx of poor appetite. She reports that she follows a select menu at [facility] [employer] and her daughter assists her with making healthy meal selections. Denies difficulty chewing or swallowing foods and liquids.  PTA she checked her blood sugars herself and readings ranged from 160-199. Reviewed last 3 recent Hgb A1c readings (from 2/12-5/13), which ranged from 8.1-8.7.   Labs:  CMP     Component Value Date/Time   NA 136 05/17/2012 0440   K 4.3 05/17/2012 0440   CL 104 05/17/2012 0440   CO2 27 05/17/2012 0440   GLUCOSE 155* 05/17/2012 0440   BUN 26* 05/17/2012 0440   CREATININE 1.40* 05/17/2012 0440   CALCIUM 9.1 05/17/2012 0440   PROT 7.3 05/15/2012 1520   ALBUMIN 3.3* 05/15/2012 1520   AST 36 05/15/2012 1520   ALT 21 05/15/2012 1520   ALKPHOS 98 05/15/2012 1520   BILITOT 0.6 05/15/2012 1520   GFRNONAA 35* 05/17/2012 0440   GFRAA 41* 05/17/2012 0440    Lab Results  Component Value Date   HGBA1C 8.2* 01/25/2012   HGBA1C 8.1* 09/23/2011   HGBA1C  Value: 8.7 (NOTE)                                                                       According to the ADA Clinical Practice Recommendations for 2011, when HbA1c is used as a screening test:   >=6.5%   Diagnostic of Diabetes Mellitus           (if abnormal result  is confirmed)  5.7-6.4%   Increased risk of developing Diabetes Mellitus  References:Diagnosis and Classification of Diabetes Mellitus,Diabetes Care,2011,34(Suppl 1):S62-S69 and Standards of Medical Care in         Diabetes - 2011,Diabetes Care,2011,34  (Suppl 1):S11-S61.* 10/21/2010   Lab Results  Component Value Date   [facility]  Value: 136        Total Cholesterol/HDL:CHD Risk Coronary Heart Disease Risk Table                     Men   Women  1/2 Average Risk   3.4   3.3  Average Risk  5.0   4.4  2 X Average Risk   9.6   7.1  3 X Average Risk  23.4   11.0        Use the calculated Patient Ratio  above and the CHD Risk Table to determine the patient's CHD Risk.        ATP III CLASSIFICATION (LDL):  <100     mg/dL   Optimal  578-469  mg/dL   Near or Above                    Optimal  130-159  mg/dL   Borderline  629-528  mg/dL   High  >413     mg/dL   Very High* 2/44/0102   CREATININE 1.40* 05/17/2012   Intake:   Intake/Output Summary (Last 24 hours) at 05/17/12 1333 Last data filed at 05/17/12 0800  Gross per 24 hour  Intake    240 ml  Output      0 ml  Net    240 ml    Diet Order: Cardiac  Supplements/Tube Feeding: none at this time  IVF:    sodium chloride Last Rate: 75 mL/hr at 05/17/12 0624    Estimated Nutritional Needs:   Kcal:2250-2625 kcals daily Protein:94-113 grams protein daily Fluid:1.5-1.6 L fluid daily  NUTRITION DIAGNOSIS: -Increased nutrient needs (NI-5.1).  Status: Ongoing  RELATED TO: increased protein needs due to would healing  AS EVIDENCE BY: pt with 2 diabetic foot ulcers.   MONITORING/EVALUATION(Goals): Weight, PO intake, labs, changes in status.  1) Pt will maintain current weight of 165# 2) Pt will meet >75% of estimated energy needs  EDUCATION NEEDS: -Education needs addressed  Dietitian [phone]  DOCUMENTATION CODES Per approved criteria  -Not Applicable    Melody Haver, RD, LDN 05/17/2012, 1:10 PM

## 2012-05-17 NOTE — Consult Note (Signed)
WOC consult Note Reason for Consult:Wounds on left foot Wound type:Neuropathic (Diabetic) foot ulcers Pressure Ulcer POA: No Measurement:Plantar aspect at 1st metatarsal head: one ulceration is closed and the callus covering it measures .2x.2cm. Same area: second ulceration with eschar measures .5cm round.   Open ulceration at 5th metatarsal head measures 2cm x 1.5cm.  Depth is not able to be determined due to the presence of slough in the wound bed.  Wound bed:See above. Drainage (amount, consistency, odor) Scant amount of green drainage on sheet from wound at 5th metatarsal head. Periwound:Periwound erythema measuring 5cm x 5cm and extending from the 3rd metatarsal head to the midfoot. Dressing procedure/placement/frequency: Recommend NS dampened gauze dressings performed twice daily. I do not recommend an enzymatic debriding agent at this time, but would consider it following her course of antibiotics as erythema resolves. I will not follow.  Please re-consult if needed. Thanks, Ladona Mow, MSN, RN, [facility], CWOCN [phone])

## 2012-05-17 NOTE — Progress Notes (Signed)
Inpatient Diabetes Program Recommendations  AACE/ADA: New Consensus Statement on Inpatient Glycemic Control  Target Ranges:  Prepandial:   less than 140 mg/dL      Peak postprandial:   less than 180 mg/dL (1-2 hours)      Critically ill patients:  140 - 180 mg/dL  Pager:  [phone] Hours:  8 am-10pm   Reason for Visit: Elevated prandial glucose:  Results for ESMERELDA, FINNIGAN (MRN [id number]) as of 05/17/2012 07:40  Ref. Range 05/16/2012 08:01 05/16/2012 11:58 05/16/2012 16:44 05/16/2012 20:52  Glucose-Capillary Latest Range: 70-99 mg/dL 147 (H) 829 (H) 562 (H) 277 (H)    Inpatient Diabetes Program Recommendations Diet: Add CHO modified medium to diet  Alfredia Client PhD, RN, BC-ADM Diabetes Coordinator  Office:  [phone] Team Pager:  [phone]

## 2012-05-17 NOTE — Progress Notes (Signed)
Blood sugar recheck of 124.

## 2012-05-18 LAB — GLUCOSE, CAPILLARY
Glucose-Capillary: 131 mg/dL — ABNORMAL HIGH (ref 70–99)
Glucose-Capillary: 168 mg/dL — ABNORMAL HIGH (ref 70–99)
Glucose-Capillary: 215 mg/dL — ABNORMAL HIGH (ref 70–99)

## 2012-05-18 LAB — BASIC METABOLIC PANEL
BUN: 25 mg/dL — ABNORMAL HIGH (ref 6–23)
Chloride: 104 mEq/L (ref 96–112)
GFR calc Af Amer: 47 mL/min — ABNORMAL LOW (ref >90)
GFR calc non Af Amer: 41 mL/min — ABNORMAL LOW (ref >90)
Potassium: 4.6 mEq/L (ref 3.5–5.1)

## 2012-05-18 LAB — VANCOMYCIN, TROUGH: Vancomycin Tr: 9 ug/mL — ABNORMAL LOW (ref 10.0–20.0)

## 2012-05-18 MED ORDER — VANCOMYCIN HCL IN DEXTROSE 1-5 GM/200ML-% IV SOLN
1000.0000 mg | INTRAVENOUS | Status: DC
  Administered 2012-05-19: 1000 mg via INTRAVENOUS
  Filled 2012-05-18 (×3): qty 200

## 2012-05-18 NOTE — Progress Notes (Signed)
Subjective: Patient feels better.Her foot wound is dressed. No fever or chills.  Objective: Vital signs in last 24 hours: Temp:  [98.3 F (36.8 C)-102.5 F (39.2 C)] 98.4 F (36.9 C) (09/10 0554) Pulse Rate:  [68-70] 70  (09/10 0554) Resp:  [18-20] 19  (09/10 0554) BP: (122-166)/(70-74) 122/70 mmHg (09/10 0554) SpO2:  [91 %-96 %] 96 % (09/10 0554) Weight change:  Last BM Date: 05/17/12  Intake/Output from previous day: 09/09 0701 - 09/10 0700 In: 1200 [P.O.:600; I.V.:600] Out: -   PHYSICAL EXAM General appearance: alert, cooperative and no distress Resp: clear to auscultation bilaterally Cardio: regular rate and rhythm, S1, S2 normal, no murmur, click, rub or gallop GI: soft, non-tender; bowel sounds normal; no masses,  no organomegaly Extremities: 3 small wounds on the lt foot, S/P ampuation of the rt foot  Lab Results:    @labtest @ [facility] No results found for this basename: PHART,PCO2,PO2ART,TCO2,HCO3 in the last 72 hours CULTURES Recent Results (from the past 240 hour(s))  URINE CULTURE     Status: Normal   Collection Time   05/15/12  3:10 PM      Component Value Range Status Comment   Specimen Description URINE, CLEAN CATCH   Final    Special Requests NONE   Final    Culture  Setup Time 05/15/2012 20:48   Final    Colony Count 5,000 COLONIES/ML   Final    Culture INSIGNIFICANT GROWTH   Final    Report Status 05/16/2012 FINAL   Final   CULTURE, BLOOD (ROUTINE X 2)     Status: Normal (Preliminary result)   Collection Time   05/15/12  4:46 PM      Component Value Range Status Comment   Specimen Description BLOOD LEFT ARM   Final    Special Requests     Final    Value: BOTTLES DRAWN AEROBIC AND ANAEROBIC 8CC EACH BOTTLE   Culture NO GROWTH 2 DAYS   Final    Report Status PENDING   Incomplete   CULTURE, BLOOD (ROUTINE X 2)     Status: Normal (Preliminary result)   Collection Time   05/15/12  4:51 PM      Component Value Range Status Comment   Specimen Description  BLOOD LEFT HAND   Final    Special Requests     Final    Value: BOTTLES DRAWN AEROBIC AND ANAEROBIC 8CC EACH BOTTLE   Culture NO GROWTH 2 DAYS   Final    Report Status PENDING   Incomplete   MRSA PCR SCREENING     Status: Abnormal   Collection Time   05/15/12 10:20 PM      Component Value Range Status Comment   MRSA by PCR POSITIVE (*) NEGATIVE Final    Studies/Results: No results found.  Medications: I have reviewed the patient's current medications.  Assesment: 1. Lt foot ulcers with cellulitis - improving  2. Diabetes Mellitus  3.H/O CAD  4. CRF- improving  5.Hypertension  Active Problems:  * No active hospital problems. *     Plan: Continue IV antibiotics We will decrease Iv fluid Continue to monitor BMP Continue wound care.     LOS: 3 days   Dillyn Menna 05/18/2012, 7:47 AM

## 2012-05-18 NOTE — Progress Notes (Addendum)
ANTIBIOTIC CONSULT NOTE  Pharmacy Consult for Vancomycin Indication: cellulitis  Allergies  Allergen Reactions  . Amiodarone     Autonomic dysfunction  . Morphine Sulfate     REACTION: jerking  . Oxycodone Hcl Itching and Other (See Comments)    Feels weird   Patient Measurements: Height: 5\' 7"  (170.2 cm) Weight: 165 lb (74.844 kg) IBW/kg (Calculated) : 61.6   Vital Signs: Temp: 98.4 F (36.9 C) (09/10 0554) Temp src: Oral (09/10 0554) BP: 122/70 mmHg (09/10 0554) Pulse Rate: 70  (09/10 0554) Intake/Output from previous day: 09/09 0701 - 09/10 0700 In: 1200 [P.O.:600; I.V.:600] Out: -  Intake/Output from this shift:    Labs:  Basename 05/18/12 0449 05/17/12 0440 05/16/12 0605 05/15/12 1520  WBC -- -- 11.6* 14.3*  HGB -- -- 11.8* 12.7  PLT -- -- 221 222  LABCREA -- -- -- --  CREATININE 1.23* 1.40* 1.72* --   Estimated Creatinine Clearance: 39.8 ml/min (by C-G formula based on Cr of 1.23). No results found for this basename: VANCOTROUGH:2,VANCOPEAK:2,VANCORANDOM:2,GENTTROUGH:2,GENTPEAK:2,GENTRANDOM:2,TOBRATROUGH:2,TOBRAPEAK:2,TOBRARND:2,AMIKACINPEAK:2,AMIKACINTROU:2,AMIKACIN:2, in the last 72 hours   Microbiology: Recent Results (from the past 720 hour(s))  URINE CULTURE     Status: Normal   Collection Time   05/15/12  3:10 PM      Component Value Range Status Comment   Specimen Description URINE, CLEAN CATCH   Final    Special Requests NONE   Final    Culture  Setup Time 05/15/2012 20:48   Final    Colony Count 5,000 COLONIES/ML   Final    Culture INSIGNIFICANT GROWTH   Final    Report Status 05/16/2012 FINAL   Final   CULTURE, BLOOD (ROUTINE X 2)     Status: Normal (Preliminary result)   Collection Time   05/15/12  4:46 PM      Component Value Range Status Comment   Specimen Description BLOOD LEFT ARM   Final    Special Requests     Final    Value: BOTTLES DRAWN AEROBIC AND ANAEROBIC 8CC EACH BOTTLE   Culture NO GROWTH 2 DAYS   Final    Report Status  PENDING   Incomplete   CULTURE, BLOOD (ROUTINE X 2)     Status: Normal (Preliminary result)   Collection Time   05/15/12  4:51 PM      Component Value Range Status Comment   Specimen Description BLOOD LEFT HAND   Final    Special Requests     Final    Value: BOTTLES DRAWN AEROBIC AND ANAEROBIC 8CC EACH BOTTLE   Culture NO GROWTH 2 DAYS   Final    Report Status PENDING   Incomplete   MRSA PCR SCREENING     Status: Abnormal   Collection Time   05/15/12 10:20 PM      Component Value Range Status Comment   MRSA by PCR POSITIVE (*) NEGATIVE Final    Medical History: Past Medical History  Diagnosis Date  . Hypertension   . Dyslipidemia   . CAD (coronary artery disease)     Non-STEMI August, 2010,,,, 40% left main, 30% LAD, 60% OM 2, 40% RCA, 90% OM1-culprit lesion-too small for intervention           . Ejection fraction     EF 55% ,2011 / EF 30% with tachycardia cardiomyopathy / EF 50% January, 2012  . Diabetes mellitus   . Dementia     ?? Early dementia ?? 2012  . GERD (gastroesophageal reflux disease)   .  TIA (transient ischemic attack)     History of TIAs  . Hypothyroidism   . PAD (peripheral artery disease)     Carotid endarterectomy, right. Stents right peroneal, right superficial, right popliteal arteries, Dr. Edilia Bo  . Amputation     Right midfoot 2010  . CKD (chronic kidney disease)     Creatinine 1.19 at December 2012 discharge  . Gait disorder   . Vertebrobasilar insufficiency   . Depression   . Hyperkalemia     ACE Inhibitor held  . Acute renal insufficiency     Catheterization August 2010  . Blindness of right eye   . Respiratory failure     Hypoxic and hypercapnic, 2011, etiology pneumonia  . Orthostatic hypotension     Syncope January 2012 with facial trauma, amiodarone stopped  . Tachycardia induced cardiomyopathy     Previous EF 30%.  Most recently 55-60% by echo 08/14/11  . Atrial fibrillation 8/10    on pradaxa, s/p AV nodal ablation  . Ventricular  tachycardia     20 beats December, 2012, asymptomatic  . Atrial flutter 05/24/09    Hospitalization in [place]; with difficult rate control; TEE cardioversion done EF 30%, secondary to tachycardia, pt did convert back tosinus rhythm; return 10/11, amiodarone restarted 10/11, no longer on it. Recurrence with RVR 08/2011.  [name] Anemia in CKD (chronic kidney disease)   . Gastroparesis 3/11    Abnormal GES  . Drug therapy,  Sotolol     Sotolol started 08/2011.  . Drug therapy     Pradaxa January, 2013  . Complete heart block     s/p AV nodal ablation  . CHF (congestive heart failure)   . On home O2     prn   Medications:  Scheduled:     . Chlorhexidine Gluconate Cloth  6 each Topical Q0600  . enoxaparin (LOVENOX) injection  40 mg Subcutaneous QHS  . feeding supplement  30 mL Oral BID BM  . insulin aspart  0-15 Units Subcutaneous TID WC  . insulin glargine  15 Units Subcutaneous QHS  . levothyroxine  75 mcg Oral QAC breakfast  . linagliptin  5 mg Oral Daily  . lisinopril  10 mg Oral Daily  . mupirocin ointment  1 application Nasal BID  . sertraline  100 mg Oral Daily  . Travoprost (BAK Free)  1 drop Both Eyes QHS  . vancomycin  750 mg Intravenous Q24H  . vitamin C  1,000 mg Oral Daily  . DISCONTD: enoxaparin (LOVENOX) injection  30 mg Subcutaneous QHS   Assessment: Okay for Protocol Received 1gm Vancomycin in ED Estimated Creatinine Clearance: 39.8 ml/min (by C-G formula based on Cr of 1.23). Cellulitis failed outpatient Bactrim.  Goal of Therapy:  Vancomycin trough level 10-15 mcg/ml  Plan:  Vancomycin 750mg  IV every 24 hours. Check trough today before dose. Monitor labs, renal fxn, and cultures per protocol Adjust Lovenox to 40mg  q24hrs due to improved renal fxn with clcr > 30  Hall, Scott A 05/18/2012,9:06 AM   Vancomycin trough slightly less than goal of 10-15. Renal function improving.  Increase Vancomycin dose to 1gm IV Q24h.  Weekly trough & Scr while on  Vancomycin.  Junita Push, PharmD, BCPS 05/18/2012@5 :18 PM

## 2012-05-18 NOTE — Evaluation (Signed)
Physical Therapy Evaluation Patient Details Name: Brittney [name] MRN: [id number] DOB: 04-06-1932 Today's Date: 05/18/2012 Time: 7829-5621 PT Time Calculation (min): 55 min  PT Assessment / Plan / Recommendation Clinical Impression  Pt was seen for eval. She lives in Independent living and ambulates with a walker.  She has a friend who is her roommate.   am not aware of any strict weightbearing precautions, so she was instructed to minimize the weight load on her L forefoot during gait.  She had no difficulty with ambulating 34' with a walker...no pain and the L foot was found to have no adverse effects from gatit upon inspection.  She has a 4 wheeled walker with a seat at her living facility.  If the dining room is quite far from her apartment, she could always use her walker as a little scooter to wheel herself there.  As long as she mimimizes walking, elevates her foot and keeps it clean, she should have no problems managing at home.    PT Assessment  Patient needs continued PT services    Follow Up Recommendations  No PT follow up    Barriers to Discharge None      Equipment Recommendations  None recommended by PT    Recommendations for Other Services     Frequency Min 1X/week    Precautions / Restrictions Precautions Precautions: None Precaution Comments: orange contact Restrictions Weight Bearing Restrictions: No Other Position/Activity Restrictions: Pt has pressure wound over 1st metatarsal head...a small wound at base of the 5th metatarsal head...we have instructed her to minimize the weight on her L forefoot and to limit the amount of gait that she does in order to maximize healing   Pertinent Vitals/Pain       Mobility  Bed Mobility Bed Mobility: Supine to Sit;Sit to Supine Supine to Sit: 6: Modified independent (Device/Increase time);HOB flat Sit to Supine: 6: Modified independent (Device/Increase time);HOB flat Transfers Transfers: Sit to Stand;Stand to Sit Sit to  Stand: 6: Modified independent (Device/Increase time);With upper extremity assist;From chair/3-in-1;From bed Stand to Sit: 6: Modified independent (Device/Increase time);To bed;To chair/3-in-1 Ambulation/Gait Ambulation/Gait Assistance: 5: Supervision Ambulation Distance (Feet): 80 Feet Assistive device: Rolling walker Gait Pattern: Step-through pattern Gait velocity: slow but steady General Gait Details: Pt was tentative with gait at first, but became proficient as she walked a little further.  She was able to limit the weight on her L forefoot during gait and the amputation of her R forefoot did not seem to present any balance problems Stairs: No Wheelchair Mobility Wheelchair Mobility: No    Exercises     PT Diagnosis: Difficulty walking (would like to have her practice gait once more with [place])  PT Problem List: Decreased mobility PT Treatment Interventions: Gait training   PT Goals Acute Rehab PT Goals PT Goal Formulation: With patient Time For Goal Achievement: 05/25/12 Potential to Achieve Goals: Good Pt will Ambulate: 51 - 150 feet;with modified independence;with rolling walker PT Goal: Ambulate - Progress: Goal set today  Visit Information  Last PT Received On: 05/18/12    Subjective Data  Subjective: I feel OK Patient Stated Goal: would like to return home at d/c   Prior Functioning  Home Living Available Help at Discharge: Available PRN/intermittently;Personal care attendant Type of Home: Independent living facility Home Access: Level entry Home Layout: One level Home Adaptive Equipment: Dan Humphreys - four wheeled;Shower chair with back Prior Function Level of Independence: Independent with assistive device(s) Able to Take Stairs?: No Vocation: Retired Musician:  HOH    Cognition  Overall Cognitive Status: Appears within functional limits for tasks assessed/performed Arousal/Alertness: Awake/alert Orientation Level: Appears intact for tasks  assessed Behavior During Session: [facility] for tasks performed    Extremity/Trunk Assessment Right Lower Extremity Assessment RLE ROM/Strength/Tone: [facility] for tasks assessed (amputation of forefoot) RLE Sensation: WFL - Proprioception RLE Coordination: WFL - gross motor Left Lower Extremity Assessment LLE ROM/Strength/Tone: WFL for tasks assessed (pressure wound over 1st metatarsal head (plantar)) LLE Sensation: WFL - Proprioception LLE Coordination: WFL - gross motor   Balance Balance Balance Assessed:  (WNL by functional observation (with a walker))  End of Session PT - End of Session Equipment Utilized During Treatment: Gait belt Activity Tolerance: Patient tolerated treatment well Patient left: in chair;with call bell/phone within reach;with chair alarm set;Other (comment) (LEs elevated)  GP     Myrlene Broker L 05/18/2012, 3:32 PM

## 2012-05-18 NOTE — Clinical Social Work Note (Signed)
CSW met with pt following PT eval. Pt at baseline and no further PT necessary. CSW updated [facility] and they are agreeable to pt's return. No FL2 needed per staff. Pt is happy to be returning and will discuss with her daughter. CSW will sign off as return is to independent living facility unless further needs arise.  Derenda Fennel, [place] [phone]

## 2012-05-18 NOTE — Care Management Note (Signed)
    Page 1 of 1   05/20/2012     11:25:58 AM   CARE MANAGEMENT NOTE 05/20/2012  Patient:  Brittney [name], Brittney [name]   Account Number:  [id number]  Date Initiated:  05/18/2012  Documentation initiated by:  Rosemary Holms  Subjective/Objective Assessment:   Pt admitted from [facility]. Diabetic ulcer/cellulitis being treated with IV anbx.     Action/Plan:   To be DC'd back to [facility] with possible HH or to SNF. CM to follow.   Anticipated DC Date:  05/20/2012   Anticipated DC Plan:  ASSISTED LIVING / REST HOME  In-house referral  Clinical Social Worker      DC Planning Services  CM consult      Choice offered to / List presented to:          [facility] arranged  HH-1 RN      [facility] agency  [facility]   Status of service:  Completed, signed off Medicare Important Message given?   (If response is "NO", the following Medicare IM given date fields will be blank) Date Medicare IM given:   Date Additional Medicare IM given:    Discharge Disposition:  HOME W HOME HEALTH SERVICES  Per UR Regulation:    If discussed at Long Length of Stay Meetings, dates discussed:   05/20/2012    Comments:  05/19/12 1300 Hilari Wethington RN BSN CM Set pt up with [facility] at her choice for wound care. Anticipate DC tomorrow. 05/18/12 1500 Montasia Chisenhall Leanord Hawking RN BSN CM

## 2012-05-19 LAB — BASIC METABOLIC PANEL
Calcium: 8.9 mg/dL (ref 8.4–10.5)
GFR calc Af Amer: 54 mL/min — ABNORMAL LOW (ref >90)
GFR calc non Af Amer: 47 mL/min — ABNORMAL LOW (ref >90)
Glucose, Bld: 243 mg/dL — ABNORMAL HIGH (ref 70–99)
Potassium: 4.6 mEq/L (ref 3.5–5.1)
Sodium: 134 mEq/L — ABNORMAL LOW (ref 135–145)

## 2012-05-19 LAB — GLUCOSE, CAPILLARY: Glucose-Capillary: 147 mg/dL — ABNORMAL HIGH (ref 70–99)

## 2012-05-19 MED ORDER — INFLUENZA VIRUS VACC SPLIT PF IM SUSP
0.5000 mL | INTRAMUSCULAR | Status: AC
  Administered 2012-05-20: 0.5 mL via INTRAMUSCULAR
  Filled 2012-05-19: qty 0.5

## 2012-05-19 NOTE — Progress Notes (Signed)
Physical Therapy Treatment Patient Details Name: Brittney [name] MRN: [id number] DOB: 10-20-1931 Today's Date: 05/19/2012 Time: 4098-1191 PT Time Calculation (min): 20 min 1 GT PT Assessment / Plan / Recommendation Comments on Treatment Session  Patient did well with 100' of gait training with RW;no LOB;verbal reminders for NWB on L forefoot and to keep head upright. Patient able to to use [facility] with supervision    Follow Up Recommendations       Barriers to Discharge        Equipment Recommendations       Recommendations for Other Services    Frequency     Plan      Precautions / Restrictions Restrictions Weight Bearing Restrictions: No   Pertinent Vitals/Pain     Mobility  Transfers Sit to Stand: 6: Modified independent (Device/Increase time) Stand to Sit: 6: Modified independent (Device/Increase time) Ambulation/Gait Ambulation/Gait Assistance: 5: Supervision Ambulation Distance (Feet): 100 Feet Assistive device: Rolling walker Ambulation/Gait Assistance Details: verbal cueing to keep head upright    Exercises     PT Diagnosis:    PT Problem List:   PT Treatment Interventions:     PT Goals    Visit Information  Last PT Received On: 05/19/12    Subjective Data      Cognition       Balance     End of Session PT - End of Session Equipment Utilized During Treatment: Gait belt Activity Tolerance: Patient tolerated treatment well Patient left: in chair;with call bell/phone within reach;with chair alarm set;Other (comment) (case managment present)   GP     Chrislyn Seedorf ATKINSO 05/19/2012, 11:50 AM

## 2012-05-19 NOTE — Progress Notes (Signed)
Occupational Therapy Screen  OT order received and chart reviewed. Based on PT eval and treatment, patient is functioning at an Independent and Mod I level with functional mobility. Patient lives in an ILF with an roommate. Patient does not require OT services at this time. Will sign off.  Limmie Patricia, OTR/L 05/19/12 1:12PM

## 2012-05-19 NOTE — Progress Notes (Signed)
ANTICOAGULATION CONSULT NOTE - Follow Up Consult  Pharmacy Consult for Lovenox Indication: VTE prophylaxis  Allergies  Allergen Reactions  . Amiodarone     Autonomic dysfunction  . Morphine Sulfate     REACTION: jerking  . Oxycodone Hcl Itching and Other (See Comments)    Feels weird    Patient Measurements: Height: 5\' 7"  (170.2 cm) Weight: 165 lb (74.844 kg) IBW/kg (Calculated) : 61.6   Vital Signs: Temp: 98.4 F (36.9 C) (09/11 0526) Temp src: Oral (09/11 0526) BP: 159/77 mmHg (09/11 0526) Pulse Rate: 69  (09/11 0526)  Labs:  Basename 05/19/12 0443 05/18/12 0449 05/17/12 0440  HGB -- -- --  HCT -- -- --  PLT -- -- --  APTT -- -- --  LABPROT -- -- --  INR -- -- --  HEPARINUNFRC -- -- --  CREATININE 1.10 1.23* 1.40*  CKTOTAL -- -- --  CKMB -- -- --  TROPONINI -- -- --    Estimated Creatinine Clearance: 44.5 ml/min (by C-G formula based on Cr of 1.1).   Medications:  Scheduled:    . Chlorhexidine Gluconate Cloth  6 each Topical Q0600  . enoxaparin (LOVENOX) injection  40 mg Subcutaneous QHS  . feeding supplement  30 mL Oral BID BM  . insulin aspart  0-15 Units Subcutaneous TID WC  . insulin glargine  15 Units Subcutaneous QHS  . levothyroxine  75 mcg Oral QAC breakfast  . linagliptin  5 mg Oral Daily  . lisinopril  10 mg Oral Daily  . mupirocin ointment  1 application Nasal BID  . sertraline  100 mg Oral Daily  . Travoprost (BAK Free)  1 drop Both Eyes QHS  . vancomycin  1,000 mg Intravenous Q24H  . vitamin C  1,000 mg Oral Daily  . DISCONTD: vancomycin  750 mg Intravenous Q24H    Assessment: 78 yo F hospitalized for tx for cellulitis receiving Lovenox for VTE px. Renal function has improved to baseline. Platelet count stable.   Goal of Therapy:  Monitor platelets by anticoagulation protocol: Yes   Plan:  Continue Lovenox 40mg  sq daily Pharmacy to sign off- re-consult as needed  Elson Clan 05/19/2012,9:10 AM

## 2012-05-19 NOTE — Progress Notes (Signed)
Subjective: Patient is improving. She is getting iv fluid and iv antibiotics. Her wound dressed.  Objective: Vital signs in last 24 hours: Temp:  [98.2 F (36.8 C)-101 F (38.3 C)] 98.4 F (36.9 C) (09/11 0526) Pulse Rate:  [68-69] 69  (09/11 0526) Resp:  [16-18] 18  (09/11 0526) BP: (135-159)/(70-77) 159/77 mmHg (09/11 0526) SpO2:  [95 %-98 %] 95 % (09/11 0526) Weight change:  Last BM Date: 05/17/12  Intake/Output from previous day: 09/10 0701 - 09/11 0700 In: 2628.8 [P.O.:840; I.V.:1788.8] Out: -   PHYSICAL EXAM General appearance: alert, cooperative and no distress Resp: clear to auscultation bilaterally Cardio: regular rate and rhythm, S1, S2 normal, no murmur, click, rub or gallop GI: soft, non-tender; bowel sounds normal; no masses,  no organomegaly Extremities: 3 small wounds on the lt foot, S/P ampuation of the rt foot  Lab Results:    @labtest @ ABGS No results found for this basename: PHART,PCO2,PO2ART,TCO2,HCO3 in the last 72 hours CULTURES Recent Results (from the past 240 hour(s))  URINE CULTURE     Status: Normal   Collection Time   05/15/12  3:10 PM      Component Value Range Status Comment   Specimen Description URINE, CLEAN CATCH   Final    Special Requests NONE   Final    Culture  Setup Time 05/15/2012 20:48   Final    Colony Count 5,000 COLONIES/ML   Final    Culture INSIGNIFICANT GROWTH   Final    Report Status 05/16/2012 FINAL   Final   CULTURE, BLOOD (ROUTINE X 2)     Status: Normal (Preliminary result)   Collection Time   05/15/12  4:46 PM      Component Value Range Status Comment   Specimen Description BLOOD LEFT ARM   Final    Special Requests     Final    Value: BOTTLES DRAWN AEROBIC AND ANAEROBIC 8CC EACH BOTTLE   Culture NO GROWTH 3 DAYS   Final    Report Status PENDING   Incomplete   CULTURE, BLOOD (ROUTINE X 2)     Status: Normal (Preliminary result)   Collection Time   05/15/12  4:51 PM      Component Value Range Status Comment   Specimen Description BLOOD LEFT HAND   Final    Special Requests     Final    Value: BOTTLES DRAWN AEROBIC AND ANAEROBIC 8CC EACH BOTTLE   Culture NO GROWTH 3 DAYS   Final    Report Status PENDING   Incomplete   MRSA PCR SCREENING     Status: Abnormal   Collection Time   05/15/12 10:20 PM      Component Value Range Status Comment   MRSA by PCR POSITIVE (*) NEGATIVE Final    Studies/Results: No results found.  Medications: I have reviewed the patient's current medications.  Assesment: 1. Lt foot ulcers with cellulitis - improving  2. Diabetes Mellitus  3.H/O CAD  4. CRF- improving  5.Hypertension  Active Problems:  * No active hospital problems. *     Plan: Continue IV antibiotics Continue to monitor BMP Continue wound care.     LOS: 4 days   Geraldene Eisel 05/19/2012, 7:48 AM

## 2012-05-20 LAB — GLUCOSE, CAPILLARY
Glucose-Capillary: 176 mg/dL — ABNORMAL HIGH (ref 70–99)
Glucose-Capillary: 283 mg/dL — ABNORMAL HIGH (ref 70–99)

## 2012-05-20 LAB — BASIC METABOLIC PANEL
BUN: 30 mg/dL — ABNORMAL HIGH (ref 6–23)
CO2: 21 mEq/L (ref 19–32)
Calcium: 9.3 mg/dL (ref 8.4–10.5)
Chloride: 103 mEq/L (ref 96–112)
Creatinine, Ser: 1.03 mg/dL (ref 0.50–1.10)
Glucose, Bld: 190 mg/dL — ABNORMAL HIGH (ref 70–99)

## 2012-05-20 LAB — CULTURE, BLOOD (ROUTINE X 2): Culture: NO GROWTH

## 2012-05-20 NOTE — Discharge Summary (Signed)
Physician Discharge Summary  Patient ID: NILZA EAKER MRN: [id number] DOB/AGE: 1932/02/07 78 y.o. Primary Care Physician:Harmani Neto, MD Admit date: 05/15/2012 Discharge date: 05/20/2012  1. Lt foot ulcers with cellulitis - improving  2. Diabetes Mellitus  3.H/O CAD  4. CRF- improving  5.Hypertension 6. Hypothyroidism 7. S/P rt foot amputation   Discharge Diagnoses:    Active Problems:  * No active hospital problems. *      Medication List     As of 05/20/2012  7:52 AM    TAKE these medications         acetaminophen 500 MG tablet   Commonly known as: TYLENOL   Take 500 mg by mouth every 6 (six) hours as needed. For pain      furosemide 40 MG tablet   Commonly known as: LASIX   Take 1 tablet (40 mg total) by mouth 2 (two) times daily.      insulin aspart 100 UNIT/ML injection   Commonly known as: novoLOG   Inject 5-9 Units into the skin 5 (five) times daily. Per sliding scale.  90-200: 5 units, 201-250: 6 units, 251-300: 7 units, 301-350: 8 units, 351-400: 9 units      insulin glargine 100 UNIT/ML injection   Commonly known as: LANTUS   Inject 15 Units into the skin at bedtime.      levothyroxine 75 MCG tablet   Commonly known as: SYNTHROID, LEVOTHROID   Take 75 mcg by mouth daily.      lisinopril 10 MG tablet   Commonly known as: PRINIVIL,ZESTRIL   Take 1 tablet (10 mg total) by mouth daily.      nitroGLYCERIN 0.4 MG SL tablet   Commonly known as: NITROSTAT   Place 0.4 mg under the tongue every 5 (five) minutes as needed. For chest pain      sertraline 100 MG tablet   Commonly known as: ZOLOFT   Take 100 mg by mouth daily.      sulfamethoxazole-trimethoprim 800-160 MG per tablet   Commonly known as: BACTRIM DS   Take 1 tablet by mouth 2 (two) times daily.      TRADJENTA 5 MG Tabs tablet   Generic drug: linagliptin   Take 5 mg by mouth daily.      travoprost (benzalkonium) 0.004 % ophthalmic solution   Commonly known as: TRAVATAN   Place 1 drop  into both eyes at bedtime.      vitamin C 1000 MG tablet   Take 1,000 mg by mouth daily.        Discharged Condition:* stable   Consults: Wound care Significant Diagnostic Studies: Dg Chest 2 View  05/15/2012  *RADIOLOGY REPORT*  Clinical Data: Fever.  CHEST - 2 VIEW  Comparison: 01/24/2012.  Findings: The pacer wires are stable.  The cardiac silhouette, mediastinal and hilar contours are mildly prominent but unchanged. There is persistent central vascular congestion and possible mild interstitial edema.  No pleural effusion or focal infiltrate.  The bony thorax is intact.  IMPRESSION: Stable cardiac enlargement and central vascular congestion with possible mild interstitial edema.   Original Report Authenticated By: P. Loralie Champagne, M.D.    Dg Foot Complete Left  05/15/2012  *RADIOLOGY REPORT*  Clinical Data: Diabetic foot ulcers.  LEFT FOOT - COMPLETE 3+ VIEW  Comparison: None  Findings: The joint spaces are maintained.  No acute bony findings or destructive bony changes.  IMPRESSION: No plain film findings for osteomyelitis.   Original Report Authenticated By: P. Loralie Champagne, M.D.  Lab Results: Basic Metabolic Panel:  Basename 05/20/12 0440 05/19/12 0443  NA 132* 134*  K 4.7 4.6  CL 103 105  CO2 21 23  GLUCOSE 190* 243*  BUN 30* 28*  CREATININE 1.03 1.10  CALCIUM 9.3 8.9  MG -- --  PHOS -- --   Liver Function Tests: No results found for this basename: AST:2,ALT:2,ALKPHOS:2,BILITOT:2,PROT:2,ALBUMIN:2 in the last 72 hours   CBC: No results found for this basename: WBC:2,NEUTROABS:2,HGB:2,HCT:2,MCV:2,PLT:2 in the last 72 hours  Recent Results (from the past 240 hour(s))  URINE CULTURE     Status: Normal   Collection Time   05/15/12  3:10 PM      Component Value Range Status Comment   Specimen Description URINE, CLEAN CATCH   Final    Special Requests NONE   Final    Culture  Setup Time 05/15/2012 20:48   Final    Colony Count 5,000 COLONIES/ML   Final    Culture  INSIGNIFICANT GROWTH   Final    Report Status 05/16/2012 FINAL   Final   CULTURE, BLOOD (ROUTINE X 2)     Status: Normal (Preliminary result)   Collection Time   05/15/12  4:46 PM      Component Value Range Status Comment   Specimen Description BLOOD LEFT ARM   Final    Special Requests     Final    Value: BOTTLES DRAWN AEROBIC AND ANAEROBIC 8CC EACH BOTTLE   Culture NO GROWTH 4 DAYS   Final    Report Status PENDING   Incomplete   CULTURE, BLOOD (ROUTINE X 2)     Status: Normal (Preliminary result)   Collection Time   05/15/12  4:51 PM      Component Value Range Status Comment   Specimen Description BLOOD LEFT HAND   Final    Special Requests     Final    Value: BOTTLES DRAWN AEROBIC AND ANAEROBIC 8CC EACH BOTTLE   Culture NO GROWTH 4 DAYS   Final    Report Status PENDING   Incomplete   MRSA PCR SCREENING     Status: Abnormal   Collection Time   05/15/12 10:20 PM      Component Value Range Status Comment   MRSA by PCR POSITIVE (*) NEGATIVE Final      Hospital Course:  This is a 78 years old female patient with history of multiple medical illness was admitted due to lt foot multiple wound and cellulitis. She also had worsening of renal failure. Patient was treated with IV Vancomycin and IV fluid. She also received wound care. Patient improved. Patient will be discharged on oral antibiotics and she will continue wound care through home health. Discharge Exam: Blood pressure 149/74, pulse 70, temperature 98.7 F (37.1 C), temperature source Oral, resp. rate 20, height 5\' 7"  (1.702 m), weight 74.844 kg (165 lb), SpO2 93.00%.  Disposition:  To Assisted living      Discharge Orders    Future Appointments: Provider: Department: Dept Phone: Center:   07/16/2012 10:45 AM [name], MD [facility] [phone] [email or web address]      Follow-up Information    Follow up with [facility], [place]. Schedule an appointment as soon as possible for a visit in 1 week.   Contact information:    [address] [place] [address] [phone]          Signed: [name]   05/20/2012, 7:52 AM

## 2012-05-21 ENCOUNTER — Encounter (HOSPITAL_COMMUNITY): Payer: Self-pay | Admitting: *Deleted

## 2012-05-21 ENCOUNTER — Inpatient Hospital Stay (HOSPITAL_COMMUNITY)
Admission: EM | Admit: 2012-05-21 | Discharge: 2012-06-08 | DRG: 616 | Disposition: A | Discharge status: Skilled Nursing Facility | Payer: Medicare Other | Source: Other Acute Inpatient Hospital | Attending: Internal Medicine | Admitting: Internal Medicine

## 2012-05-21 DIAGNOSIS — Z79899 Other long term (current) drug therapy: Secondary | ICD-10-CM

## 2012-05-21 DIAGNOSIS — Y9210 Unspecified residential institution as the place of occurrence of the external cause: Secondary | ICD-10-CM | POA: Diagnosis not present

## 2012-05-21 DIAGNOSIS — F3289 Other specified depressive episodes: Secondary | ICD-10-CM | POA: Diagnosis present

## 2012-05-21 DIAGNOSIS — E875 Hyperkalemia: Secondary | ICD-10-CM

## 2012-05-21 DIAGNOSIS — N189 Chronic kidney disease, unspecified: Secondary | ICD-10-CM

## 2012-05-21 DIAGNOSIS — I4892 Unspecified atrial flutter: Secondary | ICD-10-CM | POA: Diagnosis present

## 2012-05-21 DIAGNOSIS — N170 Acute kidney failure with tubular necrosis: Secondary | ICD-10-CM | POA: Diagnosis not present

## 2012-05-21 DIAGNOSIS — L97509 Non-pressure chronic ulcer of other part of unspecified foot with unspecified severity: Secondary | ICD-10-CM | POA: Diagnosis present

## 2012-05-21 DIAGNOSIS — M7580 Other shoulder lesions, unspecified shoulder: Secondary | ICD-10-CM

## 2012-05-21 DIAGNOSIS — L03119 Cellulitis of unspecified part of limb: Secondary | ICD-10-CM

## 2012-05-21 DIAGNOSIS — Z7901 Long term (current) use of anticoagulants: Secondary | ICD-10-CM

## 2012-05-21 DIAGNOSIS — Z794 Long term (current) use of insulin: Secondary | ICD-10-CM

## 2012-05-21 DIAGNOSIS — L02619 Cutaneous abscess of unspecified foot: Secondary | ICD-10-CM

## 2012-05-21 DIAGNOSIS — F329 Major depressive disorder, single episode, unspecified: Secondary | ICD-10-CM | POA: Diagnosis present

## 2012-05-21 DIAGNOSIS — I129 Hypertensive chronic kidney disease with stage 1 through stage 4 chronic kidney disease, or unspecified chronic kidney disease: Secondary | ICD-10-CM | POA: Diagnosis present

## 2012-05-21 DIAGNOSIS — I10 Essential (primary) hypertension: Secondary | ICD-10-CM | POA: Diagnosis present

## 2012-05-21 DIAGNOSIS — I2510 Atherosclerotic heart disease of native coronary artery without angina pectoris: Secondary | ICD-10-CM

## 2012-05-21 DIAGNOSIS — E43 Unspecified severe protein-calorie malnutrition: Secondary | ICD-10-CM | POA: Diagnosis present

## 2012-05-21 DIAGNOSIS — K219 Gastro-esophageal reflux disease without esophagitis: Secondary | ICD-10-CM | POA: Diagnosis present

## 2012-05-21 DIAGNOSIS — N183 Chronic kidney disease, stage 3 unspecified: Secondary | ICD-10-CM | POA: Diagnosis present

## 2012-05-21 DIAGNOSIS — F0390 Unspecified dementia without behavioral disturbance: Secondary | ICD-10-CM | POA: Diagnosis present

## 2012-05-21 DIAGNOSIS — S43429A Sprain of unspecified rotator cuff capsule, initial encounter: Secondary | ICD-10-CM

## 2012-05-21 DIAGNOSIS — I252 Old myocardial infarction: Secondary | ICD-10-CM

## 2012-05-21 DIAGNOSIS — Z7982 Long term (current) use of aspirin: Secondary | ICD-10-CM

## 2012-05-21 DIAGNOSIS — N289 Disorder of kidney and ureter, unspecified: Secondary | ICD-10-CM

## 2012-05-21 DIAGNOSIS — E039 Hypothyroidism, unspecified: Secondary | ICD-10-CM

## 2012-05-21 DIAGNOSIS — J9690 Respiratory failure, unspecified, unspecified whether with hypoxia or hypercapnia: Secondary | ICD-10-CM

## 2012-05-21 DIAGNOSIS — N039 Chronic nephritic syndrome with unspecified morphologic changes: Secondary | ICD-10-CM | POA: Diagnosis present

## 2012-05-21 DIAGNOSIS — F05 Delirium due to known physiological condition: Secondary | ICD-10-CM | POA: Diagnosis not present

## 2012-05-21 DIAGNOSIS — I4891 Unspecified atrial fibrillation: Secondary | ICD-10-CM | POA: Diagnosis present

## 2012-05-21 DIAGNOSIS — E1169 Type 2 diabetes mellitus with other specified complication: Principal | ICD-10-CM | POA: Diagnosis present

## 2012-05-21 DIAGNOSIS — E119 Type 2 diabetes mellitus without complications: Secondary | ICD-10-CM | POA: Diagnosis present

## 2012-05-21 DIAGNOSIS — L0390 Cellulitis, unspecified: Secondary | ICD-10-CM | POA: Diagnosis present

## 2012-05-21 DIAGNOSIS — A4902 Methicillin resistant Staphylococcus aureus infection, unspecified site: Secondary | ICD-10-CM | POA: Diagnosis present

## 2012-05-21 DIAGNOSIS — T50995A Adverse effect of other drugs, medicaments and biological substances, initial encounter: Secondary | ICD-10-CM | POA: Diagnosis not present

## 2012-05-21 DIAGNOSIS — Z6826 Body mass index (BMI) 26.0-26.9, adult: Secondary | ICD-10-CM

## 2012-05-21 DIAGNOSIS — I951 Orthostatic hypotension: Secondary | ICD-10-CM

## 2012-05-21 DIAGNOSIS — I509 Heart failure, unspecified: Secondary | ICD-10-CM | POA: Diagnosis present

## 2012-05-21 DIAGNOSIS — Z8673 Personal history of transient ischemic attack (TIA), and cerebral infarction without residual deficits: Secondary | ICD-10-CM

## 2012-05-21 DIAGNOSIS — Z950 Presence of cardiac pacemaker: Secondary | ICD-10-CM

## 2012-05-21 DIAGNOSIS — G450 Vertebro-basilar artery syndrome: Secondary | ICD-10-CM

## 2012-05-21 DIAGNOSIS — L089 Local infection of the skin and subcutaneous tissue, unspecified: Secondary | ICD-10-CM

## 2012-05-21 DIAGNOSIS — E871 Hypo-osmolality and hyponatremia: Secondary | ICD-10-CM | POA: Diagnosis present

## 2012-05-21 DIAGNOSIS — M9080 Osteopathy in diseases classified elsewhere, unspecified site: Secondary | ICD-10-CM | POA: Diagnosis present

## 2012-05-21 DIAGNOSIS — H5440 Blindness, one eye, unspecified eye: Secondary | ICD-10-CM | POA: Diagnosis present

## 2012-05-21 DIAGNOSIS — I5033 Acute on chronic diastolic (congestive) heart failure: Secondary | ICD-10-CM | POA: Diagnosis not present

## 2012-05-21 DIAGNOSIS — I442 Atrioventricular block, complete: Secondary | ICD-10-CM

## 2012-05-21 DIAGNOSIS — R9430 Abnormal result of cardiovascular function study, unspecified: Secondary | ICD-10-CM

## 2012-05-21 DIAGNOSIS — M75102 Unspecified rotator cuff tear or rupture of left shoulder, not specified as traumatic: Secondary | ICD-10-CM

## 2012-05-21 DIAGNOSIS — E11621 Type 2 diabetes mellitus with foot ulcer: Secondary | ICD-10-CM | POA: Diagnosis present

## 2012-05-21 DIAGNOSIS — M869 Osteomyelitis, unspecified: Secondary | ICD-10-CM | POA: Diagnosis present

## 2012-05-21 DIAGNOSIS — E876 Hypokalemia: Secondary | ICD-10-CM

## 2012-05-21 DIAGNOSIS — I739 Peripheral vascular disease, unspecified: Secondary | ICD-10-CM | POA: Diagnosis present

## 2012-05-21 DIAGNOSIS — E11628 Type 2 diabetes mellitus with other skin complications: Secondary | ICD-10-CM

## 2012-05-21 DIAGNOSIS — E785 Hyperlipidemia, unspecified: Secondary | ICD-10-CM | POA: Diagnosis present

## 2012-05-21 DIAGNOSIS — Z888 Allergy status to other drugs, medicaments and biological substances status: Secondary | ICD-10-CM

## 2012-05-21 DIAGNOSIS — T368X5A Adverse effect of other systemic antibiotics, initial encounter: Secondary | ICD-10-CM | POA: Diagnosis not present

## 2012-05-21 DIAGNOSIS — R269 Unspecified abnormalities of gait and mobility: Secondary | ICD-10-CM

## 2012-05-21 DIAGNOSIS — S98919A Complete traumatic amputation of unspecified foot, level unspecified, initial encounter: Secondary | ICD-10-CM

## 2012-05-21 DIAGNOSIS — I472 Ventricular tachycardia: Secondary | ICD-10-CM

## 2012-05-21 DIAGNOSIS — I70209 Unspecified atherosclerosis of native arteries of extremities, unspecified extremity: Secondary | ICD-10-CM | POA: Diagnosis present

## 2012-05-21 DIAGNOSIS — G459 Transient cerebral ischemic attack, unspecified: Secondary | ICD-10-CM

## 2012-05-21 DIAGNOSIS — D631 Anemia in chronic kidney disease: Secondary | ICD-10-CM | POA: Diagnosis present

## 2012-05-21 HISTORY — DX: Chronic diastolic (congestive) heart failure: I50.32

## 2012-05-21 MED ORDER — VANCOMYCIN HCL IN DEXTROSE 1-5 GM/200ML-% IV SOLN
1000.0000 mg | INTRAVENOUS | Status: DC
  Administered 2012-05-22: 1000 mg via INTRAVENOUS
  Filled 2012-05-21: qty 200

## 2012-05-21 MED ORDER — ACETAMINOPHEN 650 MG RE SUPP: 650.0000 mg | Freq: Four times a day (QID) | RECTAL | Status: DC | PRN

## 2012-05-21 MED ORDER — ZOLPIDEM TARTRATE 5 MG PO TABS: 5.0000 mg | ORAL_TABLET | Freq: Every evening | ORAL | Status: DC | PRN

## 2012-05-21 MED ORDER — ALUM & MAG HYDROXIDE-SIMETH 200-200-20 MG/5ML PO SUSP
30.0000 mL | Freq: Four times a day (QID) | ORAL | Status: DC | PRN
  Administered 2012-05-27 – 2012-05-29 (×2): 30 mL via ORAL
  Filled 2012-05-21 (×2): qty 30

## 2012-05-21 MED ORDER — ENOXAPARIN SODIUM 40 MG/0.4ML ~~LOC~~ SOLN
40.0000 mg | SUBCUTANEOUS | Status: DC
  Administered 2012-05-22 – 2012-05-23 (×3): 40 mg via SUBCUTANEOUS
  Filled 2012-05-21 (×4): qty 0.4

## 2012-05-21 MED ORDER — INSULIN ASPART 100 UNIT/ML ~~LOC~~ SOLN
0.0000 [IU] | Freq: Every day | SUBCUTANEOUS | Status: DC
  Administered 2012-05-22: 2 [IU] via SUBCUTANEOUS
  Administered 2012-05-25 – 2012-05-27 (×2): 3 [IU] via SUBCUTANEOUS
  Administered 2012-06-03: 4 [IU] via SUBCUTANEOUS
  Administered 2012-06-06 – 2012-06-07 (×2): 2 [IU] via SUBCUTANEOUS

## 2012-05-21 MED ORDER — HYDROMORPHONE HCL PF 1 MG/ML IJ SOLN
0.5000 mg | INTRAMUSCULAR | Status: DC | PRN
  Administered 2012-05-24: 1 mg via INTRAVENOUS
  Administered 2012-05-26: 0.5 mg via INTRAVENOUS
  Administered 2012-05-28 – 2012-06-03 (×3): 1 mg via INTRAVENOUS
  Administered 2012-06-04: 0.5 mg via INTRAVENOUS
  Administered 2012-06-04: 1 mg via INTRAVENOUS
  Filled 2012-05-21 (×7): qty 1

## 2012-05-21 MED ORDER — ONDANSETRON HCL 4 MG PO TABS: 4.0000 mg | ORAL_TABLET | Freq: Four times a day (QID) | ORAL | Status: DC | PRN

## 2012-05-21 MED ORDER — ACETAMINOPHEN 325 MG PO TABS
650.0000 mg | ORAL_TABLET | Freq: Four times a day (QID) | ORAL | Status: DC | PRN
  Administered 2012-05-23: 650 mg via ORAL
  Filled 2012-05-21: qty 2

## 2012-05-21 MED ORDER — INSULIN ASPART 100 UNIT/ML ~~LOC~~ SOLN
0.0000 [IU] | Freq: Three times a day (TID) | SUBCUTANEOUS | Status: DC
  Administered 2012-05-22 – 2012-05-23 (×4): 2 [IU] via SUBCUTANEOUS
  Administered 2012-05-23: 7 [IU] via SUBCUTANEOUS
  Administered 2012-05-23: 5 [IU] via SUBCUTANEOUS
  Administered 2012-05-24 (×2): 2 [IU] via SUBCUTANEOUS
  Administered 2012-05-25: 3 [IU] via SUBCUTANEOUS
  Administered 2012-05-25 (×2): 2 [IU] via SUBCUTANEOUS
  Administered 2012-05-26: 5 [IU] via SUBCUTANEOUS
  Administered 2012-05-26: 1 [IU] via SUBCUTANEOUS
  Administered 2012-05-26: 5 [IU] via SUBCUTANEOUS
  Administered 2012-05-27 – 2012-05-29 (×4): 2 [IU] via SUBCUTANEOUS
  Administered 2012-05-29: 1 [IU] via SUBCUTANEOUS
  Administered 2012-05-30 (×3): 2 [IU] via SUBCUTANEOUS
  Administered 2012-05-31: 1 [IU] via SUBCUTANEOUS
  Administered 2012-05-31: 2 [IU] via SUBCUTANEOUS
  Administered 2012-05-31 – 2012-06-01 (×2): 1 [IU] via SUBCUTANEOUS
  Administered 2012-06-02: 3 [IU] via SUBCUTANEOUS
  Administered 2012-06-02 – 2012-06-03 (×3): 2 [IU] via SUBCUTANEOUS
  Administered 2012-06-04: 3 [IU] via SUBCUTANEOUS
  Administered 2012-06-04: 2 [IU] via SUBCUTANEOUS
  Administered 2012-06-04 – 2012-06-05 (×3): 3 [IU] via SUBCUTANEOUS
  Administered 2012-06-05 – 2012-06-06 (×3): 5 [IU] via SUBCUTANEOUS
  Administered 2012-06-06: 2 [IU] via SUBCUTANEOUS
  Administered 2012-06-07: 5 [IU] via SUBCUTANEOUS
  Administered 2012-06-07: 3 [IU] via SUBCUTANEOUS
  Administered 2012-06-08: 9 [IU] via SUBCUTANEOUS
  Administered 2012-06-08: 5 [IU] via SUBCUTANEOUS

## 2012-05-21 MED ORDER — SODIUM CHLORIDE 0.9 % IV SOLN
INTRAVENOUS | Status: DC
  Administered 2012-05-22 – 2012-05-23 (×3): via INTRAVENOUS

## 2012-05-21 MED ORDER — ONDANSETRON HCL 4 MG/2ML IJ SOLN
4.0000 mg | Freq: Four times a day (QID) | INTRAMUSCULAR | Status: DC | PRN
  Filled 2012-05-21: qty 2

## 2012-05-21 MED ORDER — OXYCODONE HCL 5 MG PO TABS
5.0000 mg | ORAL_TABLET | ORAL | Status: DC | PRN
  Administered 2012-05-23 – 2012-06-04 (×15): 5 mg via ORAL
  Filled 2012-05-21 (×14): qty 1

## 2012-05-21 NOTE — Progress Notes (Signed)
ANTIBIOTIC CONSULT NOTE - INITIAL  Pharmacy Consult for vancomycin Indication: cellulitis  Allergies  Allergen Reactions  . Amiodarone     Autonomic dysfunction  . Morphine Sulfate     REACTION: jerking  . Oxycodone Hcl Itching and Other (See Comments)    Feels weird    Patient Measurements: Height: 5\' 7"  (170.2 cm) Weight: 166 lb 14.2 oz (75.7 kg) IBW/kg (Calculated) : 61.6   Vital Signs: Temp: 97.6 F (36.4 C) (09/13 2204) Temp src: Oral (09/13 2204) BP: 133/53 mmHg (09/13 2204) Pulse Rate: 69  (09/13 2204)  Labs:  Basename 05/20/12 0440 05/19/12 0443  WBC -- --  HGB -- --  PLT -- --  LABCREA -- --  CREATININE 1.03 1.10   Estimated Creatinine Clearance: 47.8 ml/min (by C-G formula based on Cr of 1.03).  Microbiology: Recent Results (from the past 720 hour(s))  URINE CULTURE     Status: Normal   Collection Time   05/15/12  3:10 PM      Component Value Range Status Comment   Specimen Description URINE, CLEAN CATCH   Final    Special Requests NONE   Final    Culture  Setup Time 05/15/2012 20:48   Final    Colony Count 5,000 COLONIES/ML   Final    Culture INSIGNIFICANT GROWTH   Final    Report Status 05/16/2012 FINAL   Final   CULTURE, BLOOD (ROUTINE X 2)     Status: Normal   Collection Time   05/15/12  4:46 PM      Component Value Range Status Comment   Specimen Description BLOOD LEFT ARM   Final    Special Requests     Final    Value: BOTTLES DRAWN AEROBIC AND ANAEROBIC 8CC EACH BOTTLE   Culture NO GROWTH 5 DAYS   Final    Report Status 05/20/2012 FINAL   Final   CULTURE, BLOOD (ROUTINE X 2)     Status: Normal   Collection Time   05/15/12  4:51 PM      Component Value Range Status Comment   Specimen Description BLOOD LEFT HAND   Final    Special Requests     Final    Value: BOTTLES DRAWN AEROBIC AND ANAEROBIC 8CC EACH BOTTLE   Culture NO GROWTH 5 DAYS   Final    Report Status 05/20/2012 FINAL   Final   MRSA PCR SCREENING     Status: Abnormal   Collection  Time   05/15/12 10:20 PM      Component Value Range Status Comment   MRSA by PCR POSITIVE (*) NEGATIVE Final     Medical History: Past Medical History  Diagnosis Date  . Hypertension   . Dyslipidemia   . CAD (coronary artery disease)     Non-STEMI August, 2010,,,, 40% left main, 30% LAD, 60% OM 2, 40% RCA, 90% OM1-culprit lesion-too small for intervention           . Ejection fraction     EF 55% ,2011 / EF 30% with tachycardia cardiomyopathy / EF 50% January, 2012  . Diabetes mellitus   . Dementia     ?? Early dementia ?? 2012  . GERD (gastroesophageal reflux disease)   . TIA (transient ischemic attack)     History of TIAs  . Hypothyroidism   . PAD (peripheral artery disease)     Carotid endarterectomy, right. Stents right peroneal, right superficial, right popliteal arteries, Dr. Edilia Bo  . Amputation  Right midfoot 2010  . CKD (chronic kidney disease)     Creatinine 1.19 at December 2012 discharge  . Gait disorder   . Vertebrobasilar insufficiency   . Depression   . Hyperkalemia     ACE Inhibitor held  . Acute renal insufficiency     Catheterization August 2010  . Blindness of right eye   . Respiratory failure     Hypoxic and hypercapnic, 2011, etiology pneumonia  . Orthostatic hypotension     Syncope January 2012 with facial trauma, amiodarone stopped  . Tachycardia induced cardiomyopathy     Previous EF 30%.  Most recently 55-60% by echo 08/14/11  . Atrial fibrillation 8/10    on pradaxa, s/p AV nodal ablation  . Ventricular tachycardia     20 beats December, 2012, asymptomatic  . Atrial flutter 05/24/09    Hospitalization in [place]; with difficult rate control; TEE cardioversion done EF 30%, secondary to tachycardia, pt did convert back tosinus rhythm; return 10/11, amiodarone restarted 10/11, no longer on it. Recurrence with RVR 08/2011.  [name] Anemia in CKD (chronic kidney disease)   . Gastroparesis 3/11    Abnormal GES  . Drug therapy,  Sotolol     Sotolol  started 08/2011.  . Drug therapy     Pradaxa January, 2013  . Complete heart block     s/p AV nodal ablation  . CHF (congestive heart failure)   . On home O2     prn    Medications:  Prescriptions prior to admission  Medication Sig Dispense Refill  . acetaminophen (TYLENOL) 500 MG tablet Take 500 mg by mouth every 6 (six) hours as needed. For pain      . Ascorbic Acid (VITAMIN C) 1000 MG tablet Take 1,000 mg by mouth daily.        . furosemide (LASIX) 40 MG tablet Take 1 tablet (40 mg total) by mouth 2 (two) times daily.  60 tablet  3  . insulin aspart (NOVOLOG) 100 UNIT/ML injection Inject 5-9 Units into the skin 5 (five) times daily. Per sliding scale.  90-200: 5 units, 201-250: 6 units, 251-300: 7 units, 301-350: 8 units, 351-400: 9 units      . insulin glargine (LANTUS) 100 UNIT/ML injection Inject 15 Units into the skin at bedtime.        [name] levothyroxine (SYNTHROID, LEVOTHROID) 75 MCG tablet Take 75 mcg by mouth daily.        [name] linagliptin (TRADJENTA) 5 MG TABS tablet Take 5 mg by mouth daily.      [name] lisinopril (PRINIVIL) 10 MG tablet Take 1 tablet (10 mg total) by mouth daily.  30 tablet  3  . nitroGLYCERIN (NITROSTAT) 0.4 MG SL tablet Place 0.4 mg under the tongue every 5 (five) minutes as needed. For chest pain      . sertraline (ZOLOFT) 100 MG tablet Take 100 mg by mouth daily.        [name] sulfamethoxazole-trimethoprim (BACTRIM DS) 800-160 MG per tablet Take 1 tablet by mouth 2 (two) times daily.      . travoprost, benzalkonium, (TRAVATAN) 0.004 % ophthalmic solution Place 1 drop into both eyes at bedtime.          Assessment: 78yo female was discharged from [facility] yesterday, had been on vanc for cellulitis and improved, sent home on PO ABX, now admitted to [facility] and to resume vancomycin for cellulitis; of note pt has h/o MRSA infection.  Pt was previously slightly subtherapeutic on vancomycin 750mg  IV  Q24H, CrCl has improved slightly.  Goal of Therapy:  Vancomycin trough level 10-15  mcg/ml  Plan:  Will begin vancomycin 1000mg  IV Q24H and monitor CBC, Cx, levels prn.  Colleen Can PharmD BCPS 05/21/2012,11:56 PM

## 2012-05-21 NOTE — H&P (Signed)
[facility] History and Physical  Brittney [name] VWU:[id number] DOB: 1932/08/10 DOA: 05/21/2012  Referring physician:  PCP: [name], MD   Chief Complaint: Worsening Wound Right Foot  HPI: Brittney [name] is a 78 y.o. female who was transferred to [facility] from the [facility] due to a worsening diabetic foot wound of the left foot.  She was hospitalized the previous week at [facility].  She has a history of PAD, and had a previous right foot trans metatarsal amputation.  Patient is unable to give a history, and the history was obtained from the medical records.     Review of Systems: The patient is unable to give a history  Past Medical History  Diagnosis Date  . Hypertension   . Dyslipidemia   . CAD (coronary artery disease)     Non-STEMI August, 2010,,,, 40% left main, 30% LAD, 60% OM 2, 40% RCA, 90% OM1-culprit lesion-too small for intervention           . Ejection fraction     EF 55% ,2011 / EF 30% with tachycardia cardiomyopathy / EF 50% January, 2012  . Diabetes mellitus   . Dementia     ?? Early dementia ?? 2012  . GERD (gastroesophageal reflux disease)   . TIA (transient ischemic attack)     History of TIAs  . Hypothyroidism   . PAD (peripheral artery disease)     Carotid endarterectomy, right. Stents right peroneal, right superficial, right popliteal arteries, Dr. Edilia Bo  . Amputation     Right midfoot 2010  . CKD (chronic kidney disease)     Creatinine 1.19 at December 2012 discharge  . Gait disorder   . Vertebrobasilar insufficiency   . Depression   . Hyperkalemia     ACE Inhibitor held  . Acute renal insufficiency     Catheterization August 2010  . Blindness of right eye   . Respiratory failure     Hypoxic and hypercapnic, 2011, etiology pneumonia  . Orthostatic hypotension     Syncope January 2012 with facial trauma, amiodarone stopped  . Tachycardia induced cardiomyopathy     Previous EF 30%.  Most recently 55-60% by echo 08/14/11  .  Atrial fibrillation 8/10    on pradaxa, s/p AV nodal ablation  . Ventricular tachycardia     20 beats December, 2012, asymptomatic  . Atrial flutter 05/24/09    Hospitalization in [place]; with difficult rate control; TEE cardioversion done EF 30%, secondary to tachycardia, pt did convert back tosinus rhythm; return 10/11, amiodarone restarted 10/11, no longer on it. Recurrence with RVR 08/2011.  [name] Anemia in CKD (chronic kidney disease)   . Gastroparesis 3/11    Abnormal GES  . Drug therapy,  Sotolol     Sotolol started 08/2011.  . Drug therapy     Pradaxa January, 2013  . Complete heart block     s/p AV nodal ablation  . CHF (congestive heart failure)   . On home O2     prn   Past Surgical History  Procedure Date  . Total abdominal hysterectomy   . Cholecystectomy   . Carotid endarterectomy   . Amputation 2010    RIGHT MIDFOOT  . Tonsillectomy   . Eye surgeries     Several right  . Pacemaker insertion     SJM Pacemaker implant 9/10  . Pci stent to the right peroneal and right superficial as well as right popliteal arteries     Dr. Edilia Bo  .  Av nodal ablation 08/2011    by Dr Ladona Ridgel    Prior to Admission medications   Medication Sig Start Date End Date Taking? Authorizing Provider  acetaminophen (TYLENOL) 500 MG tablet Take 500 mg by mouth every 6 (six) hours as needed. For pain   Yes Historical Provider, MD  Ascorbic Acid (VITAMIN C) 1000 MG tablet Take 1,000 mg by mouth daily.     Yes Historical Provider, MD  furosemide (LASIX) 40 MG tablet Take 1 tablet (40 mg total) by mouth 2 (two) times daily. 03/09/12  Yes [name], MD  insulin aspart (NOVOLOG) 100 UNIT/ML injection Inject 5-9 Units into the skin 5 (five) times daily. Per sliding scale.  90-200: 5 units, 201-250: 6 units, 251-300: 7 units, 301-350: 8 units, 351-400: 9 units   Yes Historical Provider, MD  insulin glargine (LANTUS) 100 UNIT/ML injection Inject 15 Units into the skin at bedtime.     Yes  Historical Provider, MD  levothyroxine (SYNTHROID, LEVOTHROID) 75 MCG tablet Take 75 mcg by mouth daily.     Yes Historical Provider, MD  linagliptin (TRADJENTA) 5 MG TABS tablet Take 5 mg by mouth daily.   Yes Historical Provider, MD  lisinopril (PRINIVIL) 10 MG tablet Take 1 tablet (10 mg total) by mouth daily. 01/27/12 01/26/13 Yes [name], MD  nitroGLYCERIN (NITROSTAT) 0.4 MG SL tablet Place 0.4 mg under the tongue every 5 (five) minutes as needed. For chest pain   Yes Historical Provider, MD  sertraline (ZOLOFT) 100 MG tablet Take 100 mg by mouth daily.     Yes Historical Provider, MD  sulfamethoxazole-trimethoprim (BACTRIM DS) 800-160 MG per tablet Take 1 tablet by mouth 2 (two) times daily.   Yes Historical Provider, MD  travoprost, benzalkonium, (TRAVATAN) 0.004 % ophthalmic solution Place 1 drop into both eyes at bedtime.     Yes Historical Provider, MD     Allergies  Allergen Reactions  . Amiodarone     Autonomic dysfunction  . Morphine Sulfate     REACTION: jerking  . Oxycodone Hcl Itching and Other (See Comments)    Feels weird    Social History:  reports that she has never smoked. She has never used smokeless tobacco. She reports that she does not drink alcohol or use illicit drugs.   Family History  Problem Relation Age of Onset  . Cancer Brother     Colon cancer  . Aortic aneurysm Mother   . Colon polyps Neg Hx   . Liver disease Neg Hx     And no CRC       Physical Exam:  GEN: Elderly 78 year old Caucasian Female examined  and in no acute distress; cooperative with exam Filed Vitals:   05/21/12 2204 05/21/12 2337  BP: 133/53   Pulse: 69   Temp: 97.6 F (36.4 C)   TempSrc: Oral   Resp: 20   Height:  5\' 7"  (1.702 m)  Weight: 75.7 kg (166 lb 14.2 oz)   SpO2: 98%    Blood pressure 133/53, pulse 69, temperature 97.6 F (36.4 C), temperature source Oral, resp. rate 20, height 5\' 7"  (1.702 m), weight 75.7 kg (166 lb 14.2 oz), SpO2 98.00%. PSYCH: She is  alert and oriented x1; does not appear anxious does not appear depressed; affect is normal HEENT: Normocephalic and Atraumatic, Mucous membranes pink; PERRLA; EOM intact; Fundi: Unable to visualize No scleral icterus, Nares: Patent, Oropharynx: Clear, Edentulous.  Neck:  FROM, no cervical lymphadenopathy nor thyromegaly or carotid  bruit; no JVD; Breasts:: Not examined CHEST WALL: No tenderness CHEST: Normal respiration, clear to auscultation bilaterally HEART: Regular rate and rhythm; no murmurs rubs or gallops BACK: No kyphosis or scoliosis; no CVA tenderness ABDOMEN: Positive Bowel Sounds, Obese, soft non-tender; no masses, no organomegaly, Rectal Exam: Not done EXTREMITIES: + Amputation of the Right Mid foot, No Edema, LLE:   no cyanosis, clubbing or edema;"Quarter size"  Ulcer on the plantar surface at the 5th MTP base  ulcerations. Genitalia: not examined PULSES: 2+ and symmetric SKIN: Normal hydration no rash or ulceration CNS: Cranial nervesgrossly intact except for Visual deficits in the Right Eye,Gait not assessed.       Labs on Admission:  Basic Metabolic Panel:  Lab 05/20/12 1610 05/19/12 0443 05/18/12 0449 05/17/12 0440 05/16/12 0605  NA 132* 134* 135 136 140  K 4.7 4.6 4.6 4.3 5.4*  CL 103 105 104 104 103  CO2 21 23 24 27 30   GLUCOSE 190* 243* 177* 155* 154*  BUN 30* 28* 25* 26* 33*  CREATININE 1.03 1.10 1.23* 1.40* 1.72*  CALCIUM 9.3 8.9 9.0 9.1 9.5  MG -- -- -- -- --  PHOS -- -- -- -- --   Liver Function Tests:  Lab 05/15/12 1520  AST 36  ALT 21  ALKPHOS 98  BILITOT 0.6  PROT 7.3  ALBUMIN 3.3*    Lab 05/15/12 1520  LIPASE 17  AMYLASE --   No results found for this basename: AMMONIA:5 in the last 168 hours CBC:  Lab 05/16/12 0605 05/15/12 1520  WBC 11.6* 14.3*  NEUTROABS -- 10.8*  HGB 11.8* 12.7  HCT 36.2 38.2  MCV 88.5 87.6  PLT 221 222   Cardiac Enzymes:  Lab 05/15/12 1520  CKTOTAL --  CKMB --  CKMBINDEX --  TROPONINI <0.30    BNP  (last 3 results)  Basename 01/24/12 2134 09/23/11 1200 08/13/11 1349  PROBNP 3488.0* 3559.0* 2562.0*   CBG:  Lab 05/20/12 1126 05/20/12 0757 05/19/12 2105 05/19/12 1617 05/19/12 1109  GLUCAP 283* 176* 125* 147* 283*    Radiological Exams on Admission: No results found.  EKG: Independently reviewed.   Assessment: Principal Problem:  *Cellulitis Active Problems:  Diabetic foot ulcer  Hypertension  Diabetes mellitus  Dyslipidemia  CAD (coronary artery disease)  Dementia  PAD (peripheral artery disease)  Hyponatremia    Plan:    Admit to Med/Surg Bed MRSA contact Isolation IV Vancomycin  Wound Care evaluation, Orthopedics evaluation if needed Reconcile Home Medications DVT prophylaxis     Code Status: FULL CODE Family Communication:  Disposition Plan: TBA Time spent: 60 MINUTES  Braden Deloach C [facility] Pager [phone]  If 7PM-7AM, please contact night-coverage [email or web address] Password [facility] 05/21/2012, 11:58 PM

## 2012-05-22 ENCOUNTER — Encounter (HOSPITAL_COMMUNITY): Payer: Self-pay | Admitting: *Deleted

## 2012-05-22 DIAGNOSIS — E039 Hypothyroidism, unspecified: Secondary | ICD-10-CM

## 2012-05-22 DIAGNOSIS — I739 Peripheral vascular disease, unspecified: Secondary | ICD-10-CM

## 2012-05-22 DIAGNOSIS — L97909 Non-pressure chronic ulcer of unspecified part of unspecified lower leg with unspecified severity: Secondary | ICD-10-CM

## 2012-05-22 LAB — CBC
MCH: 28.7 pg (ref 26.0–34.0)
MCHC: 32.5 g/dL (ref 30.0–36.0)
MCV: 88.2 fL (ref 78.0–100.0)
Platelets: 268 10*3/uL (ref 150–400)
RDW: 14.2 % (ref 11.5–15.5)

## 2012-05-22 LAB — GLUCOSE, CAPILLARY
Glucose-Capillary: 147 mg/dL — ABNORMAL HIGH (ref 70–99)
Glucose-Capillary: 173 mg/dL — ABNORMAL HIGH (ref 70–99)
Glucose-Capillary: 195 mg/dL — ABNORMAL HIGH (ref 70–99)
Glucose-Capillary: 190 mg/dL — ABNORMAL HIGH (ref 70–99)

## 2012-05-22 LAB — CBC WITH DIFFERENTIAL/PLATELET
Basophils Absolute: 0 10*3/uL (ref 0.0–0.1)
Basophils Relative: 0 % (ref 0–1)
MCHC: 32.7 g/dL (ref 30.0–36.0)
Monocytes Absolute: 1.2 10*3/uL — ABNORMAL HIGH (ref 0.1–1.0)
Neutro Abs: 9.2 10*3/uL — ABNORMAL HIGH (ref 1.7–7.7)
Neutrophils Relative %: 64 % (ref 43–77)
Platelets: 254 10*3/uL (ref 150–400)
RDW: 14.1 % (ref 11.5–15.5)

## 2012-05-22 LAB — BASIC METABOLIC PANEL
CO2: 23 mEq/L (ref 19–32)
Calcium: 9.3 mg/dL (ref 8.4–10.5)
Creatinine, Ser: 1.3 mg/dL — ABNORMAL HIGH (ref 0.50–1.10)
Glucose, Bld: 189 mg/dL — ABNORMAL HIGH (ref 70–99)

## 2012-05-22 LAB — HEMOGLOBIN A1C: Mean Plasma Glucose: 171 mg/dL — ABNORMAL HIGH (ref <117)

## 2012-05-22 MED ORDER — TRAVOPROST 0.004 % OP SOLN
1.0000 [drp] | Freq: Every day | OPHTHALMIC | Status: DC
  Filled 2012-05-22 (×9): qty 0.1

## 2012-05-22 MED ORDER — VANCOMYCIN HCL 1000 MG IV SOLR
750.0000 mg | INTRAVENOUS | Status: AC
  Administered 2012-05-22 – 2012-05-27 (×5): 750 mg via INTRAVENOUS
  Filled 2012-05-22 (×5): qty 750

## 2012-05-22 MED ORDER — TRAVOPROST (BAK FREE) 0.004 % OP SOLN
1.0000 [drp] | Freq: Every day | OPHTHALMIC | Status: DC
  Administered 2012-05-22 – 2012-06-07 (×17): 1 [drp] via OPHTHALMIC
  Filled 2012-05-22: qty 2.5

## 2012-05-22 MED ORDER — VITAMIN C 500 MG PO TABS
1000.0000 mg | ORAL_TABLET | Freq: Every day | ORAL | Status: DC
  Administered 2012-05-22 – 2012-06-01 (×11): 1000 mg via ORAL
  Filled 2012-05-22 (×13): qty 2

## 2012-05-22 MED ORDER — LEVOTHYROXINE SODIUM 75 MCG PO TABS
75.0000 ug | ORAL_TABLET | Freq: Every day | ORAL | Status: DC
  Administered 2012-05-22 – 2012-05-27 (×6): 75 ug via ORAL
  Filled 2012-05-22 (×6): qty 1

## 2012-05-22 MED ORDER — SERTRALINE HCL 100 MG PO TABS
100.0000 mg | ORAL_TABLET | Freq: Every day | ORAL | Status: DC
  Administered 2012-05-22 – 2012-06-01 (×11): 100 mg via ORAL
  Filled 2012-05-22 (×13): qty 1

## 2012-05-22 NOTE — Progress Notes (Signed)
[facility] PROGRESS NOTE  AVARY EICHENBERGER WUJ:[id number] DOB: 04-10-1932 DOA: 05/21/2012 PCP: [name], MD  Assessment/Plan: Principal Problem:  *Cellulitis Active Problems:  Hypertension  Dyslipidemia  CAD (coronary artery disease)  Diabetes mellitus  Dementia  PAD (peripheral artery disease)  Diabetic foot ulcer  Hyponatremia  1. Osteomyelitis of the foot on the left , with peripheral vascular disease in the same lower extremities. I have personally consulted Dr. Myra Gianotti from vascular surgery to give an opinion on it for revascularization prior to further orthopedic debridement of the foot. Continue IV antibiotics for now 2. Diabetes mellitus-sliding scale insulin while in house 3. Coronary artery disease with status post cardiac catheterization in 2010 revealing nonobstructive multiple vessel disease. Asymptomatic 4. Hypothyroidism-resumed Synthroid  Code Status: Full code Family Communication: family not at bedside  Disposition Plan: ALF - may need SNF   Brief narrative: 78 year old woman admitted for worsening osteomyelitis cellulitis of the left foot. Shows a has peripheral vascular disease in the left lower extremity  Consultants:  Dr. Myra Gianotti from vascular surgery  Procedures:  Arterial duplex of the lower extremities bilaterally  Antibiotics:  Vancomycin   HPI/Subjective: No complaints  Objective: Filed Vitals:   05/21/12 2204 05/21/12 2337 05/22/12 0130 05/22/12 0552  BP: 133/53  136/60 113/53  Pulse: 69  70 70  Temp: 97.6 F (36.4 C)  97.4 F (36.3 C) 97.5 F (36.4 C)  TempSrc: Oral  Axillary Oral  Resp: 20  16 16   Height:  5\' 7"  (1.702 m)    Weight: 75.7 kg (166 lb 14.2 oz)     SpO2: 98%  100% 100%    Intake/Output Summary (Last 24 hours) at 05/22/12 0849 Last data filed at 05/22/12 0500  Gross per 24 hour  Intake    520 ml  Output      0 ml  Net    520 ml   Filed Weights   05/21/12 2204  Weight: 75.7 kg (166 lb 14.2 oz)     Exam:   General:  Alert and oriented x3  Cardiovascular: RRR  Respiratory: CTAB  Abdomen: soft, NT  Left foot with plantar necrotic eschar surrounded by erythema with foul smell   Data Reviewed: Basic Metabolic Panel:  Lab 05/22/12 9562 05/20/12 0440 05/19/12 0443 05/18/12 0449 05/17/12 0440  NA 136 132* 134* 135 136  K 3.5 4.7 4.6 4.6 4.3  CL 101 103 105 104 104  CO2 23 21 23 24 27   GLUCOSE 189* 190* 243* 177* 155*  BUN 32* 30* 28* 25* 26*  CREATININE 1.30* 1.03 1.10 1.23* 1.40*  CALCIUM 9.3 9.3 8.9 9.0 9.1  MG -- -- -- -- --  PHOS -- -- -- -- --   Liver Function Tests:  Lab 05/15/12 1520  AST 36  ALT 21  ALKPHOS 98  BILITOT 0.6  PROT 7.3  ALBUMIN 3.3*    Lab 05/15/12 1520  LIPASE 17  AMYLASE --   No results found for this basename: AMMONIA:5 in the last 168 hours CBC:  Lab 05/22/12 0645 05/22/12 0045 05/16/12 0605 05/15/12 1520  WBC 13.4* 14.3* 11.6* 14.3*  NEUTROABS -- 9.2* -- 10.8*  HGB 10.9* 10.1* 11.8* 12.7  HCT 33.5* 30.9* 36.2 38.2  MCV 88.2 87.0 88.5 87.6  PLT 268 254 221 222   Cardiac Enzymes:  Lab 05/15/12 1520  CKTOTAL --  CKMB --  CKMBINDEX --  TROPONINI <0.30   BNP (last 3 results)  Basename 01/24/12 2134 09/23/11 1200 08/13/11 1349  PROBNP 3488.0*  3559.0* 2562.0*   CBG:  Lab 05/22/12 0747 05/21/12 2358 05/20/12 1126 05/20/12 0757 05/19/12 2105  GLUCAP 173* 147* 283* 176* 125*    Recent Results (from the past 240 hour(s))  URINE CULTURE     Status: Normal   Collection Time   05/15/12  3:10 PM      Component Value Range Status Comment   Specimen Description URINE, CLEAN CATCH   Final    Special Requests NONE   Final    Culture  Setup Time 05/15/2012 20:48   Final    Colony Count 5,000 COLONIES/ML   Final    Culture INSIGNIFICANT GROWTH   Final    Report Status 05/16/2012 FINAL   Final   CULTURE, BLOOD (ROUTINE X 2)     Status: Normal   Collection Time   05/15/12  4:46 PM      Component Value Range Status Comment    Specimen Description BLOOD LEFT ARM   Final    Special Requests     Final    Value: BOTTLES DRAWN AEROBIC AND ANAEROBIC 8CC EACH BOTTLE   Culture NO GROWTH 5 DAYS   Final    Report Status 05/20/2012 FINAL   Final   CULTURE, BLOOD (ROUTINE X 2)     Status: Normal   Collection Time   05/15/12  4:51 PM      Component Value Range Status Comment   Specimen Description BLOOD LEFT HAND   Final    Special Requests     Final    Value: BOTTLES DRAWN AEROBIC AND ANAEROBIC 8CC EACH BOTTLE   Culture NO GROWTH 5 DAYS   Final    Report Status 05/20/2012 FINAL   Final   MRSA PCR SCREENING     Status: Abnormal   Collection Time   05/15/12 10:20 PM      Component Value Range Status Comment   MRSA by PCR POSITIVE (*) NEGATIVE Final      Studies: No results found.  Scheduled Meds:   . enoxaparin (LOVENOX) injection  40 mg Subcutaneous Q24H  . insulin aspart  0-5 Units Subcutaneous QHS  . insulin aspart  0-9 Units Subcutaneous TID WC  . vancomycin  1,000 mg Intravenous Q24H   Continuous Infusions:   . sodium chloride 75 mL/hr at 05/22/12 0044    Principal Problem:  *Cellulitis Active Problems:  Hypertension  Dyslipidemia  CAD (coronary artery disease)  Diabetes mellitus  Dementia  PAD (peripheral artery disease)  Diabetic foot ulcer  Hyponatremia        Medrith Veillon  [facility] Pager [phone]. If 8PM-8AM, please contact night-coverage at [email or web address], password [facility] 05/22/2012, 8:49 AM  LOS: 1 day

## 2012-05-22 NOTE — Progress Notes (Signed)
ANTIBIOTIC CONSULT NOTE - Follow Up  Pharmacy Consult for vancomycin Indication: cellulitis  Allergies  Allergen Reactions  . Amiodarone     Autonomic dysfunction  . Morphine Sulfate     REACTION: jerking  . Oxycodone Hcl Itching and Other (See Comments)    Feels weird    Patient Measurements: Height: 5\' 7"  (170.2 cm) Weight: 166 lb 14.2 oz (75.7 kg) IBW/kg (Calculated) : 61.6   Vital Signs: Temp: 97.2 F (36.2 C) (09/14 1002) Temp src: Oral (09/14 1002) BP: 121/58 mmHg (09/14 1002) Pulse Rate: 72  (09/14 1002)  Labs:  Basename 05/22/12 0645 05/22/12 0045 05/20/12 0440  WBC 13.4* 14.3* --  HGB 10.9* 10.1* --  PLT 268 254 --  LABCREA -- -- --  CREATININE 1.30* -- 1.03   Estimated Creatinine Clearance: 37.8 ml/min (by C-G formula based on Cr of 1.3).  Microbiology: Recent Results (from the past 720 hour(s))  URINE CULTURE     Status: Normal   Collection Time   05/15/12  3:10 PM      Component Value Range Status Comment   Specimen Description URINE, CLEAN CATCH   Final    Special Requests NONE   Final    Culture  Setup Time 05/15/2012 20:48   Final    Colony Count 5,000 COLONIES/ML   Final    Culture INSIGNIFICANT GROWTH   Final    Report Status 05/16/2012 FINAL   Final   CULTURE, BLOOD (ROUTINE X 2)     Status: Normal   Collection Time   05/15/12  4:46 PM      Component Value Range Status Comment   Specimen Description BLOOD LEFT ARM   Final    Special Requests     Final    Value: BOTTLES DRAWN AEROBIC AND ANAEROBIC 8CC EACH BOTTLE   Culture NO GROWTH 5 DAYS   Final    Report Status 05/20/2012 FINAL   Final   CULTURE, BLOOD (ROUTINE X 2)     Status: Normal   Collection Time   05/15/12  4:51 PM      Component Value Range Status Comment   Specimen Description BLOOD LEFT HAND   Final    Special Requests     Final    Value: BOTTLES DRAWN AEROBIC AND ANAEROBIC 8CC EACH BOTTLE   Culture NO GROWTH 5 DAYS   Final    Report Status 05/20/2012 FINAL   Final   MRSA PCR  SCREENING     Status: Abnormal   Collection Time   05/15/12 10:20 PM      Component Value Range Status Comment   MRSA by PCR POSITIVE (*) NEGATIVE Final     Medical History: Past Medical History  Diagnosis Date  . Hypertension   . Dyslipidemia   . CAD (coronary artery disease)     Non-STEMI August, 2010,,,, 40% left main, 30% LAD, 60% OM 2, 40% RCA, 90% OM1-culprit lesion-too small for intervention           . Ejection fraction     EF 55% ,2011 / EF 30% with tachycardia cardiomyopathy / EF 50% January, 2012  . Diabetes mellitus   . Dementia     ?? Early dementia ?? 2012  . GERD (gastroesophageal reflux disease)   . TIA (transient ischemic attack)     History of TIAs  . Hypothyroidism   . PAD (peripheral artery disease)     Carotid endarterectomy, right. Stents right peroneal, right superficial, right popliteal arteries, Dr.  Edilia Bo  . Amputation     Right midfoot 2010  . CKD (chronic kidney disease)     Creatinine 1.19 at December 2012 discharge  . Gait disorder   . Vertebrobasilar insufficiency   . Depression   . Hyperkalemia     ACE Inhibitor held  . Acute renal insufficiency     Catheterization August 2010  . Blindness of right eye   . Respiratory failure     Hypoxic and hypercapnic, 2011, etiology pneumonia  . Orthostatic hypotension     Syncope January 2012 with facial trauma, amiodarone stopped  . Tachycardia induced cardiomyopathy     Previous EF 30%.  Most recently 55-60% by echo 08/14/11  . Atrial fibrillation 8/10    on pradaxa, s/p AV nodal ablation  . Ventricular tachycardia     20 beats December, 2012, asymptomatic  . Atrial flutter 05/24/09    Hospitalization in [place]; with difficult rate control; TEE cardioversion done EF 30%, secondary to tachycardia, pt did convert back tosinus rhythm; return 10/11, amiodarone restarted 10/11, no longer on it. Recurrence with RVR 08/2011.  [name] Anemia in CKD (chronic kidney disease)   . Gastroparesis 3/11    Abnormal  GES  . Drug therapy,  Sotolol     Sotolol started 08/2011.  . Drug therapy     Pradaxa January, 2013  . Complete heart block     s/p AV nodal ablation  . CHF (congestive heart failure)   . On home O2     prn    Medications:  Prescriptions prior to admission  Medication Sig Dispense Refill  . acetaminophen (TYLENOL) 500 MG tablet Take 500 mg by mouth every 6 (six) hours as needed. For pain      . Ascorbic Acid (VITAMIN C) 1000 MG tablet Take 1,000 mg by mouth daily.        . furosemide (LASIX) 40 MG tablet Take 1 tablet (40 mg total) by mouth 2 (two) times daily.  60 tablet  3  . insulin aspart (NOVOLOG) 100 UNIT/ML injection Inject 5-9 Units into the skin 5 (five) times daily. Per sliding scale.  90-200: 5 units, 201-250: 6 units, 251-300: 7 units, 301-350: 8 units, 351-400: 9 units      . insulin glargine (LANTUS) 100 UNIT/ML injection Inject 15 Units into the skin at bedtime.        [name] levothyroxine (SYNTHROID, LEVOTHROID) 75 MCG tablet Take 75 mcg by mouth daily.        [name] linagliptin (TRADJENTA) 5 MG TABS tablet Take 5 mg by mouth daily.      [name] lisinopril (PRINIVIL) 10 MG tablet Take 1 tablet (10 mg total) by mouth daily.  30 tablet  3  . nitroGLYCERIN (NITROSTAT) 0.4 MG SL tablet Place 0.4 mg under the tongue every 5 (five) minutes as needed. For chest pain      . sertraline (ZOLOFT) 100 MG tablet Take 100 mg by mouth daily.        [name] sulfamethoxazole-trimethoprim (BACTRIM DS) 800-160 MG per tablet Take 1 tablet by mouth 2 (two) times daily.      . travoprost, benzalkonium, (TRAVATAN) 0.004 % ophthalmic solution Place 1 drop into both eyes at bedtime.          Assessment: 78yo female was discharged from [facility] 9/12 had been on vanc for cellulitis and improved, sent home on PO ABX, now admitted to [facility] and to resume vancomycin for cellulitis; of note pt has h/o MRSA infection.  Pt  was previously slightly subtherapeutic on vancomycin 750mg  IV Q24H, CrCl has decreased.  Goal of Therapy:    Vancomycin trough level 10-15 mcg/ml  Plan:  Change vancomycin 750mg  IV Q24H and monitor CBC, Cx, levels prn.  Woodfin Ganja PharmD 05/22/2012,10:50 AM

## 2012-05-22 NOTE — Progress Notes (Signed)
VASCULAR LAB PRELIMINARY  PRELIMINARY  PRELIMINARY  PRELIMINARY  Bilateral lower extremity arterial duplex  completed.    Preliminary report: Right - Multiple stent imaged throughout the thigh. There is an area of greater than 50% stenosis just prior to a stent in the mid femoral artery. Left - Moderate to severe heterogeneous plaque noted throughout the lower extremity with an occlusion of the superficial femoral artery in the proximal to mid region. Reconstitution of flow is noted in the mid to distal region with diminished flow throughout the lower leg.  [place], RVS 05/22/2012, 1:58 PM

## 2012-05-23 DIAGNOSIS — M869 Osteomyelitis, unspecified: Secondary | ICD-10-CM | POA: Diagnosis present

## 2012-05-23 DIAGNOSIS — I70269 Atherosclerosis of native arteries of extremities with gangrene, unspecified extremity: Secondary | ICD-10-CM

## 2012-05-23 LAB — GLUCOSE, CAPILLARY
Glucose-Capillary: 262 mg/dL — ABNORMAL HIGH (ref 70–99)
Glucose-Capillary: 157 mg/dL — ABNORMAL HIGH (ref 70–99)

## 2012-05-23 LAB — TROPONIN I: Troponin I: <0.3 ng/mL (ref <0.30)

## 2012-05-23 MED ORDER — PIPERACILLIN-TAZOBACTAM 3.375 G IVPB
3.3750 g | Freq: Three times a day (TID) | INTRAVENOUS | Status: DC
  Administered 2012-05-23 – 2012-05-29 (×15): 3.375 g via INTRAVENOUS
  Filled 2012-05-23 (×20): qty 50

## 2012-05-23 NOTE — Consult Note (Signed)
[facility]      Consult Note  Patient name: Brittney [name] MRN: [id number] DOB: Nov 12, 1931 Sex: female  Consulting Physician:  Hospitalists  Reason for Consult: No chief complaint on file.   HISTORY OF PRESENT ILLNESS: This is a 78 year old female transferred to [facility] for a worsening diabetic foot wound on the left. She is previously known to me for a right foot wound. She underwent a midfoot amputation by Dr. Lajoyce Corners. At that time I also performed stenting of her popliteal and peroneal artery on the right. This wound has healed. She states that for the past 2 weeks she has noticed worsening of the left foot wound with redness.  She has multiple vascular comorbidities including diabetes and hypertension which are medically managed. In addition she is treated for hyperlipidemia. She has underlying coronary disease as well.  Past Medical History  Diagnosis Date  . Hypertension   . Dyslipidemia   . CAD (coronary artery disease)     Non-STEMI August, 2010,,,, 40% left main, 30% LAD, 60% OM 2, 40% RCA, 90% OM1-culprit lesion-too small for intervention           . Ejection fraction     EF 55% ,2011 / EF 30% with tachycardia cardiomyopathy / EF 50% January, 2012  . Diabetes mellitus   . Dementia     ?? Early dementia ?? 2012  . GERD (gastroesophageal reflux disease)   . TIA (transient ischemic attack)     History of TIAs  . Hypothyroidism   . PAD (peripheral artery disease)     Carotid endarterectomy, right. Stents right peroneal, right superficial, right popliteal arteries, Dr. Edilia Bo  . Amputation     Right midfoot 2010  . CKD (chronic kidney disease)     Creatinine 1.19 at December 2012 discharge  . Gait disorder   . Vertebrobasilar insufficiency   . Depression   . Hyperkalemia     ACE Inhibitor held  . Acute renal insufficiency     Catheterization August 2010  . Blindness of right eye   . Respiratory failure     Hypoxic and hypercapnic, 2011,  etiology pneumonia  . Orthostatic hypotension     Syncope January 2012 with facial trauma, amiodarone stopped  . Tachycardia induced cardiomyopathy     Previous EF 30%.  Most recently 55-60% by echo 08/14/11  . Atrial fibrillation 8/10    on pradaxa, s/p AV nodal ablation  . Ventricular tachycardia     20 beats December, 2012, asymptomatic  . Atrial flutter 05/24/09    Hospitalization in [place]; with difficult rate control; TEE cardioversion done EF 30%, secondary to tachycardia, pt did convert back tosinus rhythm; return 10/11, amiodarone restarted 10/11, no longer on it. Recurrence with RVR 08/2011.  [name] Anemia in CKD (chronic kidney disease)   . Gastroparesis 3/11    Abnormal GES  . Drug therapy,  Sotolol     Sotolol started 08/2011.  . Drug therapy     Pradaxa January, 2013  . Complete heart block     s/p AV nodal ablation  . CHF (congestive heart failure)   . On home O2     prn    Past Surgical History  Procedure Date  . Total abdominal hysterectomy   . Cholecystectomy   . Carotid endarterectomy   . Amputation 2010    RIGHT MIDFOOT  . Tonsillectomy   . Eye surgeries     Several right  . Pacemaker insertion  SJM Pacemaker implant 9/10  . Pci stent to the right peroneal and right superficial as well as right popliteal arteries     Dr. Edilia Bo  . Av nodal ablation 08/2011    by Dr Ladona Ridgel    History   Social History  . Marital Status: Widowed    Spouse Name: N/A    Number of Children: 3  . Years of Education: N/A   Occupational History  . RETIRED    Social History Main Topics  . Smoking status: Never Smoker   . Smokeless tobacco: Never Used  . Alcohol Use: No  . Drug Use: No  . Sexually Active: No   Other Topics Concern  . Not on file   Social History Narrative   Gets regular exercise.    Family History  Problem Relation Age of Onset  . Cancer Brother     Colon cancer  . Aortic aneurysm Mother   . Colon polyps Neg Hx   . Liver disease Neg  Hx     And no CRC    Allergies as of 05/21/2012 - Review Complete 05/21/2012  Allergen Reaction Noted  . Amiodarone  09/25/2011  . Morphine sulfate  04/19/2008  . Oxycodone hcl Itching and Other (See Comments)     No current facility-administered medications on file prior to encounter.   Current Outpatient Prescriptions on File Prior to Encounter  Medication Sig Dispense Refill  . acetaminophen (TYLENOL) 500 MG tablet Take 500 mg by mouth every 6 (six) hours as needed. For pain      . Ascorbic Acid (VITAMIN C) 1000 MG tablet Take 1,000 mg by mouth daily.        . furosemide (LASIX) 40 MG tablet Take 1 tablet (40 mg total) by mouth 2 (two) times daily.  60 tablet  3  . insulin aspart (NOVOLOG) 100 UNIT/ML injection Inject 5-9 Units into the skin 5 (five) times daily. Per sliding scale.  90-200: 5 units, 201-250: 6 units, 251-300: 7 units, 301-350: 8 units, 351-400: 9 units      . insulin glargine (LANTUS) 100 UNIT/ML injection Inject 15 Units into the skin at bedtime.        [name] levothyroxine (SYNTHROID, LEVOTHROID) 75 MCG tablet Take 75 mcg by mouth daily.        [name] linagliptin (TRADJENTA) 5 MG TABS tablet Take 5 mg by mouth daily.      [name] lisinopril (PRINIVIL) 10 MG tablet Take 1 tablet (10 mg total) by mouth daily.  30 tablet  3  . nitroGLYCERIN (NITROSTAT) 0.4 MG SL tablet Place 0.4 mg under the tongue every 5 (five) minutes as needed. For chest pain      . sertraline (ZOLOFT) 100 MG tablet Take 100 mg by mouth daily.        [name] sulfamethoxazole-trimethoprim (BACTRIM DS) 800-160 MG per tablet Take 1 tablet by mouth 2 (two) times daily.      . travoprost, benzalkonium, (TRAVATAN) 0.004 % ophthalmic solution Place 1 drop into both eyes at bedtime.           REVIEW OF SYSTEMS: No change from admission review of systems  PHYSICAL EXAMINATION: General: The patient appears their stated age.  Vital signs are BP 149/57  Pulse 72  Temp 98.6 F (37 C) (Oral)  Resp 20  Ht 5\' 7"  (1.702 m)   Wt 166 lb 14.2 oz (75.7 kg)  BMI 26.14 kg/m2  SpO2 100% Pulmonary: Respirations are non-labored HEENT:  No gross abnormalities  Abdomen: Soft and non-tender  Musculoskeletal: There are no major deformities.   Neurologic: No focal weakness or paresthesias are detected, Skin: Ulceration on the base of the left fifth metatarsal head. There is associated erythema on the lateral aspect of the left foot.  Psychiatric: The patient has normal affect. Cardiovascular: There is a regular rate and rhythm without significant murmur appreciated. I cannot palpate pulses  Diagnostic Studies: Ultrasound has been reviewed which shows a high-grade stenosis within the right leg stents as well as a superficial femoral artery occlusion on the left  Assessment:  Diabetic left foot ulcer Plan: I discussed with the patient that this is a limb threatening situation. I do need better evaluation of her circulation. This is best done with angiography given her ultrasound findings. I recommended proceeding tomorrow by my partner. She will require angiography with access in the right groin, bilateral runoff and possible percutaneous intervention of the left leg if feasible. The risks and benefits of the procedure were discussed with the patient including the risk of bleeding and the risk of distal embolization. I also discussed that this may not be able to be done percutaneously and she may require surgical bypass. She will be n.p.o. after midnight.     Jorge Ny, M.D. [facility] [place] Office: [phone] Pager:  [phone]

## 2012-05-23 NOTE — Progress Notes (Signed)
[facility] PROGRESS NOTE  Brittney [name] YNW:[id number] DOB: 1932-03-26 DOA: 05/21/2012 PCP: [name], MD  Assessment/Plan: Principal Problem:  *Cellulitis Active Problems:  Hypertension  Dyslipidemia  CAD (coronary artery disease)  Diabetes mellitus  Dementia  PAD (peripheral artery disease)  Diabetic foot ulcer  Hyponatremia  1. Osteomyelitis of the foot on the left , with peripheral vascular disease in the same lower extremities.  consulted Dr. Myra Gianotti from vascular surgery to give an opinion on it for revascularization prior to further orthopedic debridement of the foot- await this consult. Continue IV antibiotics for now  Arterial duplex showed:  Left - Moderate to severe heterogeneous plaque noted throughout the lower extremity with an occlusion of the superficial   femoral artery in the proximal to mid region. Reconstitution of flow is noted in the mid to distal region with diminished flow   throughout the lower leg. 2. Diabetes mellitus-sliding scale insulin while in house 3. Coronary artery disease with status post cardiac catheterization in 2010 revealing nonobstructive multiple vessel disease. Asymptomatic 4. Hypothyroidism-resumed Synthroid 5. CKD- baseline is about 1.3 (range 1-2)  Code Status: full Family Communication: daughter at bedside Disposition Plan: may need SNF (in independent living- family thinks patient would not do well in SNF- she worked in one for 18 years)   78 year old woman admitted for worsening osteomyelitis cellulitis of the left foot. Shows a has peripheral vascular disease in the left lower extremity   Consultants:  Dr. Myra Gianotti from vascular surgery   Procedures:  Arterial duplex of the lower extremities bilaterally   Antibiotics:  Vancomycin    HPI/Subjective: Feeling ok, no new c/o No chills Eating well Bowels moving well   Objective: Filed Vitals:   05/22/12 1800 05/22/12 2235 05/23/12 0152 05/23/12 0542  BP:  116/59 150/54 136/74 149/57  Pulse: 72 70 70 72  Temp: 98 F (36.7 C) 99.1 F (37.3 C) 99.3 F (37.4 C) 98.6 F (37 C)  TempSrc: Oral Oral Oral Oral  Resp: 18 16 16 20   Height:      Weight:      SpO2: 99% 99% 100% 100%    Intake/Output Summary (Last 24 hours) at 05/23/12 0917 Last data filed at 05/23/12 0700  Gross per 24 hour  Intake   1995 ml  Output      0 ml  Net   1995 ml   Filed Weights   05/21/12 2204  Weight: 75.7 kg (166 lb 14.2 oz)    Exam:   General:  Pleasant and cooperative, NAD  Cardiovascular: rrr  Respiratory: clear anterior, no wheezing  Abdomen: +BS, soft, NT/ND Skin: Left foot with plantar necrotic eschar surrounded by erythema with foul smell    Data Reviewed: Basic Metabolic Panel:  Lab 05/22/12 6578 05/20/12 0440 05/19/12 0443 05/18/12 0449 05/17/12 0440  NA 136 132* 134* 135 136  K 3.5 4.7 4.6 4.6 4.3  CL 101 103 105 104 104  CO2 23 21 23 24 27   GLUCOSE 189* 190* 243* 177* 155*  BUN 32* 30* 28* 25* 26*  CREATININE 1.30* 1.03 1.10 1.23* 1.40*  CALCIUM 9.3 9.3 8.9 9.0 9.1  MG -- -- -- -- --  PHOS -- -- -- -- --   Liver Function Tests: No results found for this basename: AST:5,ALT:5,ALKPHOS:5,BILITOT:5,PROT:5,ALBUMIN:5 in the last 168 hours No results found for this basename: LIPASE:5,AMYLASE:5 in the last 168 hours No results found for this basename: AMMONIA:5 in the last 168 hours CBC:  Lab 05/22/12 0645 05/22/12 0045  WBC  13.4* 14.3*  NEUTROABS -- 9.2*  HGB 10.9* 10.1*  HCT 33.5* 30.9*  MCV 88.2 87.0  PLT 268 254   Cardiac Enzymes: No results found for this basename: CKTOTAL:5,CKMB:5,CKMBINDEX:5,TROPONINI:5 in the last 168 hours BNP (last 3 results)  Basename 01/24/12 2134 09/23/11 1200 08/13/11 1349  PROBNP 3488.0* 3559.0* 2562.0*   CBG:  Lab 05/22/12 2308 05/22/12 1710 05/22/12 1225 05/22/12 0747 05/21/12 2358  GLUCAP 233* 190* 195* 173* 147*    Recent Results (from the past 240 hour(s))  URINE CULTURE      Status: Normal   Collection Time   05/15/12  3:10 PM      Component Value Range Status Comment   Specimen Description URINE, CLEAN CATCH   Final    Special Requests NONE   Final    Culture  Setup Time 05/15/2012 20:48   Final    Colony Count 5,000 COLONIES/ML   Final    Culture INSIGNIFICANT GROWTH   Final    Report Status 05/16/2012 FINAL   Final   CULTURE, BLOOD (ROUTINE X 2)     Status: Normal   Collection Time   05/15/12  4:46 PM      Component Value Range Status Comment   Specimen Description BLOOD LEFT ARM   Final    Special Requests     Final    Value: BOTTLES DRAWN AEROBIC AND ANAEROBIC 8CC EACH BOTTLE   Culture NO GROWTH 5 DAYS   Final    Report Status 05/20/2012 FINAL   Final   CULTURE, BLOOD (ROUTINE X 2)     Status: Normal   Collection Time   05/15/12  4:51 PM      Component Value Range Status Comment   Specimen Description BLOOD LEFT HAND   Final    Special Requests     Final    Value: BOTTLES DRAWN AEROBIC AND ANAEROBIC 8CC EACH BOTTLE   Culture NO GROWTH 5 DAYS   Final    Report Status 05/20/2012 FINAL   Final   MRSA PCR SCREENING     Status: Abnormal   Collection Time   05/15/12 10:20 PM      Component Value Range Status Comment   MRSA by PCR POSITIVE (*) NEGATIVE Final      Studies: No results found.  Scheduled Meds:   . enoxaparin (LOVENOX) injection  40 mg Subcutaneous Q24H  . insulin aspart  0-5 Units Subcutaneous QHS  . insulin aspart  0-9 Units Subcutaneous TID WC  . levothyroxine  75 mcg Oral Daily  . sertraline  100 mg Oral Daily  . Travoprost (BAK Free)  1 drop Both Eyes QHS  . vancomycin  750 mg Intravenous Q24H  . vitamin C  1,000 mg Oral Daily  . DISCONTD: vancomycin  1,000 mg Intravenous Q24H   Continuous Infusions:   . sodium chloride 75 mL/hr at 05/23/12 1478    Principal Problem:  *Cellulitis Active Problems:  Hypertension  Dyslipidemia  CAD (coronary artery disease)  Diabetes mellitus  Dementia  PAD (peripheral artery disease)   Diabetic foot ulcer  Hyponatremia    Time spent: 35    [facility]  [facility] Pager [phone].  05/23/2012, 9:17 AM  LOS: 2 days

## 2012-05-23 NOTE — Progress Notes (Signed)
ANTIBIOTIC CONSULT NOTE - Follow Up  ADDENDUM: patient known to pharmacy from vancomycin dosing.  Now asked to add zosyn.  Will give Zosyn 3.375G IV q8h to be infused over 4 hours Jimie Kuwahara L. Illene Bolus, PharmD, BCPS Clinical Pharmacist Pager: [phone] Pharmacy: [phone] 05/23/2012 7:18 PM     Pharmacy Consult for vancomycin Indication: cellulitis  Allergies  Allergen Reactions  . Amiodarone     Autonomic dysfunction  . Morphine Sulfate     REACTION: jerking  . Oxycodone Hcl Itching and Other (See Comments)    Feels weird    Patient Measurements: Height: 5\' 7"  (170.2 cm) Weight: 166 lb 14.2 oz (75.7 kg) IBW/kg (Calculated) : 61.6   Vital Signs: Temp: 103.5 F (39.7 C) (09/15 1850) Temp src: Rectal (09/15 1850) BP: 156/58 mmHg (09/15 1838) Pulse Rate: 71  (09/15 1838)  Labs:  Basename 05/22/12 0645 05/22/12 0045  WBC 13.4* 14.3*  HGB 10.9* 10.1*  PLT 268 254  LABCREA -- --  CREATININE 1.30* --   Estimated Creatinine Clearance: 37.8 ml/min (by C-G formula based on Cr of 1.3).  Microbiology: Recent Results (from the past 720 hour(s))  URINE CULTURE     Status: Normal   Collection Time   05/15/12  3:10 PM      Component Value Range Status Comment   Specimen Description URINE, CLEAN CATCH   Final    Special Requests NONE   Final    Culture  Setup Time 05/15/2012 20:48   Final    Colony Count 5,000 COLONIES/ML   Final    Culture INSIGNIFICANT GROWTH   Final    Report Status 05/16/2012 FINAL   Final   CULTURE, BLOOD (ROUTINE X 2)     Status: Normal   Collection Time   05/15/12  4:46 PM      Component Value Range Status Comment   Specimen Description BLOOD LEFT ARM   Final    Special Requests     Final    Value: BOTTLES DRAWN AEROBIC AND ANAEROBIC 8CC EACH BOTTLE   Culture NO GROWTH 5 DAYS   Final    Report Status 05/20/2012 FINAL   Final   CULTURE, BLOOD (ROUTINE X 2)     Status: Normal   Collection Time   05/15/12  4:51 PM      Component Value Range  Status Comment   Specimen Description BLOOD LEFT HAND   Final    Special Requests     Final    Value: BOTTLES DRAWN AEROBIC AND ANAEROBIC 8CC EACH BOTTLE   Culture NO GROWTH 5 DAYS   Final    Report Status 05/20/2012 FINAL   Final   MRSA PCR SCREENING     Status: Abnormal   Collection Time   05/15/12 10:20 PM      Component Value Range Status Comment   MRSA by PCR POSITIVE (*) NEGATIVE Final     Medical History: Past Medical History  Diagnosis Date  . Hypertension   . Dyslipidemia   . CAD (coronary artery disease)     Non-STEMI August, 2010,,,, 40% left main, 30% LAD, 60% OM 2, 40% RCA, 90% OM1-culprit lesion-too small for intervention           . Ejection fraction     EF 55% ,2011 / EF 30% with tachycardia cardiomyopathy / EF 50% January, 2012  . Diabetes mellitus   . Dementia     ?? Early dementia ?? 2012  . GERD (gastroesophageal reflux disease)   .  TIA (transient ischemic attack)     History of TIAs  . Hypothyroidism   . PAD (peripheral artery disease)     Carotid endarterectomy, right. Stents right peroneal, right superficial, right popliteal arteries, Dr. Edilia Bo  . Amputation     Right midfoot 2010  . CKD (chronic kidney disease)     Creatinine 1.19 at December 2012 discharge  . Gait disorder   . Vertebrobasilar insufficiency   . Depression   . Hyperkalemia     ACE Inhibitor held  . Acute renal insufficiency     Catheterization August 2010  . Blindness of right eye   . Respiratory failure     Hypoxic and hypercapnic, 2011, etiology pneumonia  . Orthostatic hypotension     Syncope January 2012 with facial trauma, amiodarone stopped  . Tachycardia induced cardiomyopathy     Previous EF 30%.  Most recently 55-60% by echo 08/14/11  . Atrial fibrillation 8/10    on pradaxa, s/p AV nodal ablation  . Ventricular tachycardia     20 beats December, 2012, asymptomatic  . Atrial flutter 05/24/09    Hospitalization in [place]; with difficult rate control; TEE  cardioversion done EF 30%, secondary to tachycardia, pt did convert back tosinus rhythm; return 10/11, amiodarone restarted 10/11, no longer on it. Recurrence with RVR 08/2011.  [name] Anemia in CKD (chronic kidney disease)   . Gastroparesis 3/11    Abnormal GES  . Drug therapy,  Sotolol     Sotolol started 08/2011.  . Drug therapy     Pradaxa January, 2013  . Complete heart block     s/p AV nodal ablation  . CHF (congestive heart failure)   . On home O2     prn    Medications:  Prescriptions prior to admission  Medication Sig Dispense Refill  . acetaminophen (TYLENOL) 500 MG tablet Take 500 mg by mouth every 6 (six) hours as needed. For pain      . Ascorbic Acid (VITAMIN C) 1000 MG tablet Take 1,000 mg by mouth daily.        . furosemide (LASIX) 40 MG tablet Take 1 tablet (40 mg total) by mouth 2 (two) times daily.  60 tablet  3  . insulin aspart (NOVOLOG) 100 UNIT/ML injection Inject 5-9 Units into the skin 5 (five) times daily. Per sliding scale.  90-200: 5 units, 201-250: 6 units, 251-300: 7 units, 301-350: 8 units, 351-400: 9 units      . insulin glargine (LANTUS) 100 UNIT/ML injection Inject 15 Units into the skin at bedtime.        [name] levothyroxine (SYNTHROID, LEVOTHROID) 75 MCG tablet Take 75 mcg by mouth daily.        [name] linagliptin (TRADJENTA) 5 MG TABS tablet Take 5 mg by mouth daily.      [name] lisinopril (PRINIVIL) 10 MG tablet Take 1 tablet (10 mg total) by mouth daily.  30 tablet  3  . nitroGLYCERIN (NITROSTAT) 0.4 MG SL tablet Place 0.4 mg under the tongue every 5 (five) minutes as needed. For chest pain      . sertraline (ZOLOFT) 100 MG tablet Take 100 mg by mouth daily.        [name] sulfamethoxazole-trimethoprim (BACTRIM DS) 800-160 MG per tablet Take 1 tablet by mouth 2 (two) times daily.      . travoprost, benzalkonium, (TRAVATAN) 0.004 % ophthalmic solution Place 1 drop into both eyes at bedtime.          Assessment: 78yo female  was discharged from [facility] 9/12 had been on vanc for  cellulitis and improved, sent home on PO ABX, now admitted to [facility] and to resume vancomycin for cellulitis; of note pt has h/o MRSA infection.  Pt was previously slightly subtherapeutic on vancomycin 750mg  IV Q24H, CrCl has decreased.  Goal of Therapy:  Vancomycin trough level 10-15 mcg/ml  Plan:  Change vancomycin 750mg  IV Q24H and monitor CBC, Cx, levels prn.  Drusilla Kanner PharmD 05/23/2012,7:17 PM

## 2012-05-24 ENCOUNTER — Encounter (HOSPITAL_COMMUNITY)
Admission: EM | Disposition: A | Discharge status: Skilled Nursing Facility | Payer: Self-pay | Source: Other Acute Inpatient Hospital | Attending: Internal Medicine

## 2012-05-24 DIAGNOSIS — I10 Essential (primary) hypertension: Secondary | ICD-10-CM

## 2012-05-24 DIAGNOSIS — L97509 Non-pressure chronic ulcer of other part of unspecified foot with unspecified severity: Secondary | ICD-10-CM

## 2012-05-24 DIAGNOSIS — E1169 Type 2 diabetes mellitus with other specified complication: Principal | ICD-10-CM

## 2012-05-24 DIAGNOSIS — L97909 Non-pressure chronic ulcer of unspecified part of unspecified lower leg with unspecified severity: Secondary | ICD-10-CM

## 2012-05-24 DIAGNOSIS — E119 Type 2 diabetes mellitus without complications: Secondary | ICD-10-CM

## 2012-05-24 HISTORY — PX: LOWER EXTREMITY ANGIOGRAM: SHX5508

## 2012-05-24 LAB — TROPONIN I
Troponin I: <0.3 ng/mL (ref <0.30)
Troponin I: <0.3 ng/mL (ref <0.30)

## 2012-05-24 LAB — CBC
MCH: 28.8 pg (ref 26.0–34.0)
MCV: 89.2 fL (ref 78.0–100.0)
Platelets: 301 10*3/uL (ref 150–400)
RDW: 14.3 % (ref 11.5–15.5)

## 2012-05-24 LAB — GLUCOSE, CAPILLARY
Glucose-Capillary: 202 mg/dL — ABNORMAL HIGH (ref 70–99)
Glucose-Capillary: 192 mg/dL — ABNORMAL HIGH (ref 70–99)

## 2012-05-24 SURGERY — ANGIOGRAM, LOWER EXTREMITY
Anesthesia: LOCAL

## 2012-05-24 MED ORDER — INSULIN GLARGINE 100 UNIT/ML ~~LOC~~ SOLN
15.0000 [IU] | Freq: Every day | SUBCUTANEOUS | Status: DC
  Administered 2012-05-24 – 2012-05-25 (×2): 15 [IU] via SUBCUTANEOUS

## 2012-05-24 MED ORDER — ACETAMINOPHEN 325 MG PO TABS: 650.0000 mg | ORAL_TABLET | ORAL | Status: DC | PRN

## 2012-05-24 MED ORDER — LIDOCAINE HCL (PF) 1 % IJ SOLN
INTRAMUSCULAR | Status: AC
  Filled 2012-05-24: qty 30

## 2012-05-24 MED ORDER — HEPARIN (PORCINE) IN NACL 2-0.9 UNIT/ML-% IJ SOLN
INTRAMUSCULAR | Status: AC
  Filled 2012-05-24: qty 500

## 2012-05-24 MED ORDER — HYDRALAZINE HCL 20 MG/ML IJ SOLN
INTRAMUSCULAR | Status: AC
  Filled 2012-05-24: qty 1

## 2012-05-24 MED ORDER — ONDANSETRON HCL 4 MG/2ML IJ SOLN
4.0000 mg | Freq: Four times a day (QID) | INTRAMUSCULAR | Status: DC | PRN
  Administered 2012-05-27 – 2012-05-29 (×2): 4 mg via INTRAVENOUS
  Filled 2012-05-24: qty 2

## 2012-05-24 MED ORDER — SODIUM CHLORIDE 0.9 % IV SOLN
1.0000 mL/kg/h | INTRAVENOUS | Status: AC
  Administered 2012-05-24 (×2): 1 mL/kg/h via INTRAVENOUS

## 2012-05-24 MED ORDER — ENOXAPARIN SODIUM 40 MG/0.4ML ~~LOC~~ SOLN
40.0000 mg | SUBCUTANEOUS | Status: DC
  Administered 2012-05-25: 40 mg via SUBCUTANEOUS
  Filled 2012-05-24: qty 0.4

## 2012-05-24 NOTE — Op Note (Signed)
OPERATIVE NOTE   PROCEDURE: 1.  Right common femoral artery cannulation under ultrasound guidance 2.  Aortogram 3.  Bilateral leg runoff  PRE-OPERATIVE DIAGNOSIS: Left foot ulcer  POST-OPERATIVE DIAGNOSIS: same as above   SURGEON: Leonides Sake, MD  ANESTHESIA: conscious sedation  ESTIMATED BLOOD LOSS: 30 cc  CONTRAST: 95 cc  FINDING(S):  Aorta: Patent  Superior mesenteric artery: Patent, splenic artery originates off SMA, distal splenic artery is calcified Celiac artery: not seen  Right Left  RA Patent, long length, lower kidney position than usual Patent renal stent, nephrogram is patent  CIA patent patent  EIA patent patent  IIA patent patent  CFA patent patent  SFA Patent SFA stent with 50 - >75% stenoses present in-stent Flush occlusion at takeoff, distal SFA reconstitutes from profunda branches  PFA Patent Patent  Pop Patent Patent, disease and calcified above the knee segment  Trif Patent but diseased with 75=90% stenosis No AT or PT takeoff evident  AT Patent, runoff to foot Occluded  Pero Occluded proximally, reconstitutes via branches distally, evident as runoff to foot distally Patent, only runoff to left foot  PT Occluded Occluded   SPECIMEN(S):  none  INDICATIONS:   Brittney [name] is a 78 y.o. female who presents with left foot ulcer.  The patient presents for: aortogram, bilateral runooff, and possible intervention..  I discussed with the patient the nature of angiographic procedures, especially the limited patencies of any endovascular intervention.  The patient is aware of that the risks of an angiographic procedure include but are not limited to: bleeding, infection, access site complications, renal failure, embolization, rupture of vessel, dissection, possible need for emergent surgical intervention, possible need for surgical procedures to treat the patient's pathology, and stroke and death.  The patient is aware of the risks and agrees to  proceed.  DESCRIPTION: After full informed consent was obtained from the patient, the patient was brought back to the angiography suite.  The patient was placed supine upon the angiography table and connected to monitoring equipment.  The patient was then given conscious sedation, the amounts of which are documented in the patient's chart.  The patient was prepped and drape in the standard fashion for an angiographic procedure.  At this point, attention was turned to the right groin.  Under ultrasound guidance, the right common femoral artery will be cannulated with a 18 gauge needle.  The [facility] wire was passed up into the aorta.  The needle was exchanged for a 5-Fr sheath, which was advanced over the wire into the common femoral artery.  The dilator was then removed.  The Omniflush catheter was then loaded over the wire up to the level of L1.  The catheter was connected to the power injector circuit.  After de-airring and de-clotting the circuit, a power injector aortogram was completed.  The findings are as listed above.  I pulled the catheter down to just proximal to aortic bifurcation.  An automated bilateral leg runoff was completed.  The findings are listed above.  Based on the images, no endovascular intervention is possible.  .The wire was replaced in the catheter to straighten the crook.  The wire and catheter were removed together.  The sheath was aspirated.  No clots were present and the sheath was reloaded with heparinized saline.    COMPLICATIONS: none  CONDITION: stable   Leonides Sake, MD [facility] [place] Office: [phone] Pager: [phone]  05/24/2012, 1:37 PM

## 2012-05-24 NOTE — Progress Notes (Addendum)
VASCULAR LAB PRELIMINARY  ARTERIAL  ABI completed:    RIGHT    LEFT    PRESSURE WAVEFORM  PRESSURE WAVEFORM  BRACHIAL 160 Triphasic BRACHIAL 162 Triphasic  DP 117 Dampened Monophasic DP 89 Dampened Monophasic         PT 110 Dampened Monophasic PT 89 Dampened Monophasic                  RIGHT LEFT  ABI 0.72 0.55   ABIs indicate a moderate reduction in arterial flow on the right and a moderate to severe reduction on the left.   05/22/2012 - Duplex scan revealed - Right - Multiple stents noted in the thigh. There is a greater than 50% stenosis just prior to the stent in the mid superficial femoral artery characterized by high grade velocities                                                               Left - Moderate to severe heterogeneous plaque noted throughout the leg. There is an occlusion of the proximal to mid superficial femoral artery. Reconstitution was noted in the mid to distal region of the superficial femoral artery with diminished flow noted throughout the lower leg.   Kennia Vanvorst, 05/24/2012, 10:00 AM

## 2012-05-24 NOTE — Progress Notes (Signed)
Inpatient Diabetes Program Recommendations  AACE/ADA: New Consensus Statement on Inpatient Glycemic Control (2013)  Target Ranges:  Prepandial:   less than 140 mg/dL      Peak postprandial:   less than 180 mg/dL (1-2 hours)      Critically ill patients:  140 - 180 mg/dL   Reason for Visit: Hyperglycemia  Inpatient Diabetes Program Recommendations Insulin - Basal: Please order Lantus 15 units at HS (per home regimen)  Note: Thank you, Lenor Coffin, RN, CNS, Diabetes Coordinator [phone])

## 2012-05-24 NOTE — H&P (Signed)
  [facility]  History and Physical Update  The patient was interviewed and re-examined.  The patient's previous History and Physical has been reviewed and is unchanged from Dr. Estanislado Spire consult on: 05/23/12.  There is no change in the plan of care: aortogram, bilateral leg runoff, and possible L leg intervention.Leonides Sake, MD [facility] [place] Office: [phone] Pager: [phone]  05/24/2012, 7:41 AM

## 2012-05-24 NOTE — Progress Notes (Signed)
[facility] PROGRESS NOTE  Brittney [name] XBJ:[id number] DOB: Aug 07, 1932 DOA: 05/21/2012 PCP: [name], MD  Assessment/Plan: Principal Problem:  *Osteomyelitis Active Problems:  Hypertension  Dyslipidemia  CAD (coronary artery disease)  Diabetes mellitus  Dementia  PAD (peripheral artery disease)  Cellulitis  Diabetic foot ulcer  Hyponatremia  1. Osteomyelitis of the foot on the left , with peripheral vascular disease in the same lower extremities.   Continue IV antibiotics for now  Arterial duplex showed:  Left - Moderate to severe heterogeneous plaque noted throughout the lower extremity with an occlusion of the superficial  femoral artery in the proximal to mid region. Reconstitution of flow is noted in the mid to distal region with diminished flow  throughout the lower leg.- Dr. Myra Gianotti recommends aortogram, B/L leg run off and possible L leg intervention 2. Diabetes mellitus-sliding scale insulin while in house 3. Coronary artery disease with status post cardiac catheterization in 2010 revealing nonobstructive multiple vessel disease. Had episode last PM- CE negative, resolved (started with fever) 4. Hypothyroidism-resumed Synthroid 5. CKD- baseline is about 1.3 (range 1-2)  Code Status: full Family Communication: daughter at bedside Disposition Plan: may need SNF (in independent living- family thinks patient would not do well in SNF- she worked in one for 18 years)- will need PT consult   78 year old woman admitted for worsening osteomyelitis cellulitis of the left foot. Shows a has peripheral vascular disease in the left lower extremity   Consultants:  Dr. Myra Gianotti from vascular surgery   Procedures:  Arterial duplex of the lower extremities bilaterally   Antibiotics:  Vancomycin    HPI/Subjective: Episode of fever and chest pain last night No chest pain currently, no fever or chills Foot is sore   Objective: Filed Vitals:   05/23/12 2307 05/23/12  2315 05/24/12 0236 05/24/12 0554  BP: 138/49  131/55 139/60  Pulse: 70  69 69  Temp: 98.5 F (36.9 C) 100.3 F (37.9 C) 97.9 F (36.6 C) 98.1 F (36.7 C)  TempSrc: Oral Rectal Oral   Resp: 19  18 19   Height:      Weight:      SpO2: 99%  99% 100%    Intake/Output Summary (Last 24 hours) at 05/24/12 0829 Last data filed at 05/24/12 0558  Gross per 24 hour  Intake   1927 ml  Output      0 ml  Net   1927 ml   Filed Weights   05/21/12 2204  Weight: 75.7 kg (166 lb 14.2 oz)    Exam:   General:  Pleasant and cooperative, NAD  Cardiovascular: rrr  Respiratory: clear anterior, no wheezing  Abdomen: +BS, soft, NT/ND Skin: Left foot with plantar necrotic eschar surrounded by erythema with foul smell    Data Reviewed: Basic Metabolic Panel:  Lab 05/22/12 3086 05/20/12 0440 05/19/12 0443 05/18/12 0449  NA 136 132* 134* 135  K 3.5 4.7 4.6 4.6  CL 101 103 105 104  CO2 23 21 23 24   GLUCOSE 189* 190* 243* 177*  BUN 32* 30* 28* 25*  CREATININE 1.30* 1.03 1.10 1.23*  CALCIUM 9.3 9.3 8.9 9.0  MG -- -- -- --  PHOS -- -- -- --   Liver Function Tests: No results found for this basename: AST:5,ALT:5,ALKPHOS:5,BILITOT:5,PROT:5,ALBUMIN:5 in the last 168 hours No results found for this basename: LIPASE:5,AMYLASE:5 in the last 168 hours No results found for this basename: AMMONIA:5 in the last 168 hours CBC:  Lab 05/22/12 0645 05/22/12 0045  WBC 13.4* 14.3*  NEUTROABS -- 9.2*  HGB 10.9* 10.1*  HCT 33.5* 30.9*  MCV 88.2 87.0  PLT 268 254   Cardiac Enzymes:  Lab 05/24/12 0037 05/23/12 2000  CKTOTAL -- --  CKMB -- --  CKMBINDEX -- --  TROPONINI <0.30 <0.30   BNP (last 3 results)  Basename 01/24/12 2134 09/23/11 1200 08/13/11 1349  PROBNP 3488.0* 3559.0* 2562.0*   CBG:  Lab 05/24/12 0805 05/23/12 2257 05/23/12 1722 05/23/12 1220 05/23/12 0756  GLUCAP 202* 157* 262* 344* 192*    Recent Results (from the past 240 hour(s))  URINE CULTURE     Status: Normal    Collection Time   05/15/12  3:10 PM      Component Value Range Status Comment   Specimen Description URINE, CLEAN CATCH   Final    Special Requests NONE   Final    Culture  Setup Time 05/15/2012 20:48   Final    Colony Count 5,000 COLONIES/ML   Final    Culture INSIGNIFICANT GROWTH   Final    Report Status 05/16/2012 FINAL   Final   CULTURE, BLOOD (ROUTINE X 2)     Status: Normal   Collection Time   05/15/12  4:46 PM      Component Value Range Status Comment   Specimen Description BLOOD LEFT ARM   Final    Special Requests     Final    Value: BOTTLES DRAWN AEROBIC AND ANAEROBIC 8CC EACH BOTTLE   Culture NO GROWTH 5 DAYS   Final    Report Status 05/20/2012 FINAL   Final   CULTURE, BLOOD (ROUTINE X 2)     Status: Normal   Collection Time   05/15/12  4:51 PM      Component Value Range Status Comment   Specimen Description BLOOD LEFT HAND   Final    Special Requests     Final    Value: BOTTLES DRAWN AEROBIC AND ANAEROBIC 8CC EACH BOTTLE   Culture NO GROWTH 5 DAYS   Final    Report Status 05/20/2012 FINAL   Final   MRSA PCR SCREENING     Status: Abnormal   Collection Time   05/15/12 10:20 PM      Component Value Range Status Comment   MRSA by PCR POSITIVE (*) NEGATIVE Final      Studies: No results found.  Scheduled Meds:    . enoxaparin (LOVENOX) injection  40 mg Subcutaneous Q24H  . insulin aspart  0-5 Units Subcutaneous QHS  . insulin aspart  0-9 Units Subcutaneous TID WC  . levothyroxine  75 mcg Oral Daily  . piperacillin-tazobactam (ZOSYN)  IV  3.375 g Intravenous Q8H  . sertraline  100 mg Oral Daily  . Travoprost (BAK Free)  1 drop Both Eyes QHS  . vancomycin  750 mg Intravenous Q24H  . vitamin C  1,000 mg Oral Daily   Continuous Infusions:    . sodium chloride 75 mL/hr at 05/23/12 2202    Principal Problem:  *Osteomyelitis Active Problems:  Hypertension  Dyslipidemia  CAD (coronary artery disease)  Diabetes mellitus  Dementia  PAD (peripheral artery  disease)  Cellulitis  Diabetic foot ulcer  Hyponatremia    Time spent: 25    Marlin Canary  [facility] Pager [phone].  05/24/2012, 8:29 AM  LOS: 3 days

## 2012-05-25 ENCOUNTER — Encounter (HOSPITAL_COMMUNITY): Payer: Self-pay | Admitting: Physician Assistant

## 2012-05-25 ENCOUNTER — Inpatient Hospital Stay (HOSPITAL_COMMUNITY): Payer: Medicare Other

## 2012-05-25 DIAGNOSIS — Z01810 Encounter for preprocedural cardiovascular examination: Secondary | ICD-10-CM

## 2012-05-25 DIAGNOSIS — I2510 Atherosclerotic heart disease of native coronary artery without angina pectoris: Secondary | ICD-10-CM

## 2012-05-25 DIAGNOSIS — N189 Chronic kidney disease, unspecified: Secondary | ICD-10-CM

## 2012-05-25 DIAGNOSIS — I509 Heart failure, unspecified: Secondary | ICD-10-CM

## 2012-05-25 DIAGNOSIS — F0390 Unspecified dementia without behavioral disturbance: Secondary | ICD-10-CM

## 2012-05-25 LAB — BASIC METABOLIC PANEL
BUN: 20 mg/dL (ref 6–23)
CO2: 24 mEq/L (ref 19–32)
Chloride: 104 mEq/L (ref 96–112)
Creatinine, Ser: 1.11 mg/dL — ABNORMAL HIGH (ref 0.50–1.10)

## 2012-05-25 LAB — CBC
HCT: 29.9 % — ABNORMAL LOW (ref 36.0–46.0)
MCV: 89 fL (ref 78.0–100.0)
RBC: 3.36 MIL/uL — ABNORMAL LOW (ref 3.87–5.11)
WBC: 14.4 10*3/uL — ABNORMAL HIGH (ref 4.0–10.5)

## 2012-05-25 LAB — GLUCOSE, CAPILLARY
Glucose-Capillary: 200 mg/dL — ABNORMAL HIGH (ref 70–99)
Glucose-Capillary: 284 mg/dL — ABNORMAL HIGH (ref 70–99)

## 2012-05-25 MED ORDER — POTASSIUM CHLORIDE CRYS ER 20 MEQ PO TBCR
20.0000 meq | EXTENDED_RELEASE_TABLET | Freq: Once | ORAL | Status: AC
  Administered 2012-05-25: 20 meq via ORAL
  Filled 2012-05-25: qty 1

## 2012-05-25 MED ORDER — SODIUM CHLORIDE 0.9 % IJ SOLN
10.0000 mL | INTRAMUSCULAR | Status: DC | PRN
Start: 2012-05-25 — End: 2012-05-29
  Administered 2012-05-26 – 2012-05-28 (×3): 10 mL

## 2012-05-25 MED ORDER — FUROSEMIDE 40 MG PO TABS
40.0000 mg | ORAL_TABLET | Freq: Every day | ORAL | Status: DC
  Administered 2012-05-25: 40 mg via ORAL
  Filled 2012-05-25 (×2): qty 1

## 2012-05-25 NOTE — Progress Notes (Signed)
Peripherally Inserted Central Catheter/Midline Placement  The IV Nurse has discussed with the patient and/or persons authorized to consent for the patient, the purpose of this procedure and the potential benefits and risks involved with this procedure.  The benefits include less needle sticks, lab draws from the catheter and patient may be discharged home with the catheter.  Risks include, but not limited to, infection, bleeding, blood clot (thrombus formation), and puncture of an artery; nerve damage and irregular heat beat.  Alternatives to this procedure were also discussed.  PICC/Midline Placement Documentation  PICC / Midline Double Lumen 05/25/12 PICC Right Basilic (Active)       Stacie Glaze Horton 05/25/2012, 6:31 PM

## 2012-05-25 NOTE — Consult Note (Signed)
Cardiology Consult Note   [name] MRN: [id number], DOB/AGE: [birth year]   Admit date: 05/21/2012 Date of Consult: 05/25/2012  Primary Physician: [name], MD Primary Cardiologist: Jerral Bonito, MD  Reason for consult: pre-op cardiac evaluation for left fem-pop bypass  HPI:   Brittney [name] is a 78yo female with PMHx significant for CAD (nonobstructive cath 2010- angiographic data below), atrial fibrillation/flutter (on Pradaxa as recent as 03/2012 (but not now) s/p AV nodal ablation 2012), chronic diastolic CHF (LVEF 55-60%, grade 2 diastolic dysfunction, mild LA dilatation, moderate TR, normal RV size/function, paradoxical ventricular septal motion), history of complete HB (s/p St. Jude PPM 2010), type 2 DM, HTN, HL, PVD (s/p R mid foot amputation, carotid endarterectomy, prior peripheral vascular stenting), history of TIA, vertebrobasilar insufficiency, history of A/CKD, GERD,  hypothyroidism and anemia of chronic disease who was admitted to [facility] for left foot osteomyelitis sourced from cellulitis/diabetic foot ulcer.   Brittney [name] states that Brittney [name] first developed a R foot ulcer three weeks ago that did not seem to heel. Brittney [name] then developed R foot redness, swelling and tenderness which affected Brittney [name] mobility thus prompting Brittney [name] admission to [facility]. From a cardiac standpoint, Brittney [name] states that Brittney [name] has been doing relatively well. Dr. Myrtis Ser last saw Brittney [name] in follow-up on 04/14/12, and had been much improved from an arrhythmic and heart failure standpoint. On interview today, Brittney [name] states that prior to developing Brittney R foot ulcer, Brittney [name] was ambulating well in Brittney [name] assisted living facility, often down long hallways without incident. Brittney [name] denies chest pain, n/v, diaphoresis, shortness of breath, DOE, orthopnea (sleeps on one flat pillow without incident), LE edema, palpitations and new cough. Brittney [name] does report occasional PND, but this has remained stable. Brittney [name] denies fevers, chills and  active bleeding.   Brittney [name] was [facility] from [facility] due to a worsening diabetic foot wound of Brittney left foot. Brittney [name] had been hospitalized Brittney week prior at [facility]. Brittney [name] was admitted by Brittney medicine service and started on IV antibiotics. Arterial duplex US revealed diffuse PAD in Brittney LLE. Vascular surgery was consulted and performed angiography. SFA was found to be occluded. Brittney plan was made to pursue left fem-pop bypass. Cardiology has been consulted for pre-op cardiac evaluation.   Problem List: Past Medical History  Diagnosis Date  . Hypertension   . Dyslipidemia   . CAD (coronary artery disease)     Non-STEMI August, 2010,,,, 40% left main, 30% LAD, 60% OM 2, 40% RCA, 90% OM1-culprit lesion-too small for intervention           . Ejection fraction     EF 55% ,2011 / EF 30% with tachycardia cardiomyopathy / EF 50% January, 2012  . Diabetes mellitus   . Dementia     ?? Early dementia ?? 2012  . GERD (gastroesophageal reflux disease)   . TIA (transient ischemic attack)     History of TIAs  . Hypothyroidism   . PAD (peripheral artery disease)     Carotid endarterectomy, right. Stents right peroneal, right superficial, right popliteal arteries, Dr. Edilia Bo  . Amputation     Right midfoot 2010  . CKD (chronic kidney disease)     Creatinine 1.19 at December 2012 discharge  . Gait disorder   . Vertebrobasilar insufficiency   . Depression   . Hyperkalemia     ACE Inhibitor held  . Acute renal insufficiency     Catheterization August 2010  . Blindness of right eye   .  Respiratory failure     Hypoxic and hypercapnic, 2011, etiology pneumonia  . Orthostatic hypotension     Syncope January 2012 with facial trauma, amiodarone stopped  . Atrial fibrillation 8/10    on pradaxa, s/p AV nodal ablation  . Ventricular tachycardia     20 beats December, 2012, asymptomatic  . Atrial flutter 05/24/09    Hospitalization in [place]; with difficult rate control; TEE  cardioversion done EF 30%, secondary to tachycardia, pt did convert back tosinus rhythm; return 10/11, amiodarone restarted 10/11, no longer on it. Recurrence with RVR 08/2011.  [name] Anemia in CKD (chronic kidney disease)   . Gastroparesis 3/11    Abnormal GES  . Drug therapy,  Sotolol     Sotolol started 08/2011.  . Drug therapy     Pradaxa January, 2013  . Complete heart block     s/p AV nodal ablation  . Chronic diastolic CHF (congestive heart failure)     echo 03/2012- LVEF 55-60%, grade 2 diastolic dysfunction, mild LA dilatation, mild MR, paradoxical vent septal motion  . On home O2     prn    Past Surgical History  Procedure Date  . Total abdominal hysterectomy   . Cholecystectomy   . Carotid endarterectomy   . Amputation 2010    RIGHT MIDFOOT  . Tonsillectomy   . Eye surgeries     Several right  . Pacemaker insertion     SJM Pacemaker implant 9/10  . Pci stent to Brittney right peroneal and right superficial as well as right popliteal arteries     Dr. Edilia Bo  . Av nodal ablation 08/2011    by Dr Ladona Ridgel     Allergies:  Allergies  Allergen Reactions  . Amiodarone     Autonomic dysfunction  . Morphine Sulfate     REACTION: jerking  . Oxycodone Hcl Itching and Other (See Comments)    Feels weird    Home Medications: Prior to Admission medications   Medication Sig Start Date End Date Taking? Authorizing Provider  acetaminophen (TYLENOL) 500 MG tablet Take 500 mg by mouth every 6 (six) hours as needed. For pain   Yes Historical Provider, MD  Ascorbic Acid (VITAMIN C) 1000 MG tablet Take 1,000 mg by mouth daily.     Yes Historical Provider, MD  furosemide (LASIX) 40 MG tablet Take 1 tablet (40 mg total) by mouth 2 (two) times daily. 03/09/12  Yes [name], MD  insulin aspart (NOVOLOG) 100 UNIT/ML injection Inject 5-9 Units into Brittney skin 5 (five) times daily. Per sliding scale.  90-200: 5 units, 201-250: 6 units, 251-300: 7 units, 301-350: 8 units, 351-400: 9 units    Yes Historical Provider, MD  insulin glargine (LANTUS) 100 UNIT/ML injection Inject 15 Units into Brittney skin at bedtime.     Yes Historical Provider, MD  levothyroxine (SYNTHROID, LEVOTHROID) 75 MCG tablet Take 75 mcg by mouth daily.     Yes Historical Provider, MD  linagliptin (TRADJENTA) 5 MG TABS tablet Take 5 mg by mouth daily.   Yes Historical Provider, MD  lisinopril (PRINIVIL) 10 MG tablet Take 1 tablet (10 mg total) by mouth daily. 01/27/12 01/26/13 Yes [name], MD  nitroGLYCERIN (NITROSTAT) 0.4 MG SL tablet Place 0.4 mg under Brittney tongue every 5 (five) minutes as needed. For chest pain   Yes Historical Provider, MD  sertraline (ZOLOFT) 100 MG tablet Take 100 mg by mouth daily.     Yes Historical Provider, MD  sulfamethoxazole-trimethoprim (BACTRIM DS)  800-160 MG per tablet Take 1 tablet by mouth 2 (two) times daily.   Yes Historical Provider, MD  travoprost, benzalkonium, (TRAVATAN) 0.004 % ophthalmic solution Place 1 drop into both eyes at bedtime.     Yes Historical Provider, MD    Inpatient Medications:     . insulin aspart  0-5 Units Subcutaneous QHS  . insulin aspart  0-9 Units Subcutaneous TID WC  . insulin glargine  15 Units Subcutaneous QHS  . levothyroxine  75 mcg Oral Daily  . piperacillin-tazobactam (ZOSYN)  IV  3.375 g Intravenous Q8H  . sertraline  100 mg Oral Daily  . Travoprost (BAK Free)  1 drop Both Eyes QHS  . vancomycin  750 mg Intravenous Q24H  . vitamin C  1,000 mg Oral Daily  . DISCONTD: enoxaparin  40 mg Subcutaneous Q24H   Prescriptions prior to admission  Medication Sig Dispense Refill  . acetaminophen (TYLENOL) 500 MG tablet Take 500 mg by mouth every 6 (six) hours as needed. For pain      . Ascorbic Acid (VITAMIN C) 1000 MG tablet Take 1,000 mg by mouth daily.        . furosemide (LASIX) 40 MG tablet Take 1 tablet (40 mg total) by mouth 2 (two) times daily.  60 tablet  3  . insulin aspart (NOVOLOG) 100 UNIT/ML injection Inject 5-9 Units into Brittney skin  5 (five) times daily. Per sliding scale.  90-200: 5 units, 201-250: 6 units, 251-300: 7 units, 301-350: 8 units, 351-400: 9 units      . insulin glargine (LANTUS) 100 UNIT/ML injection Inject 15 Units into Brittney skin at bedtime.        [name] levothyroxine (SYNTHROID, LEVOTHROID) 75 MCG tablet Take 75 mcg by mouth daily.        [name] linagliptin (TRADJENTA) 5 MG TABS tablet Take 5 mg by mouth daily.      [name] lisinopril (PRINIVIL) 10 MG tablet Take 1 tablet (10 mg total) by mouth daily.  30 tablet  3  . nitroGLYCERIN (NITROSTAT) 0.4 MG SL tablet Place 0.4 mg under Brittney tongue every 5 (five) minutes as needed. For chest pain      . sertraline (ZOLOFT) 100 MG tablet Take 100 mg by mouth daily.        [name] sulfamethoxazole-trimethoprim (BACTRIM DS) 800-160 MG per tablet Take 1 tablet by mouth 2 (two) times daily.      . travoprost, benzalkonium, (TRAVATAN) 0.004 % ophthalmic solution Place 1 drop into both eyes at bedtime.          Family History  Problem Relation Age of Onset  . Cancer Brother     Colon cancer  . Aortic aneurysm Mother   . Colon polyps Neg Hx   . Liver disease Neg Hx     And no CRC     History   Social History  . Marital Status: Widowed    Spouse Name: N/A    Number of Children: 3  . Years of Education: N/A   Occupational History  . RETIRED    Social History Main Topics  . Smoking status: Never Smoker   . Smokeless tobacco: Never Used  . Alcohol Use: No  . Drug Use: No  . Sexually Active: No   Other Topics Concern  . Not on file   Social History Narrative   Gets regular exercise.     Review of Systems: General: negative for chills, fever, night sweats or weight changes.  Cardiovascular: negative for  icnreased PND, chest pain, dyspnea on exertion, edema, palpitations or increased shortness of breath Dermatological: positive for rash Respiratory: negative for cough or wheezing Urologic: negative for hematuria Abdominal: negative for nausea, vomiting, diarrhea, bright  red blood per rectum, melena, or hematemesis Neurologic: negative for visual changes, syncope, or dizziness All other systems reviewed and are otherwise negative except as noted above.  Physical Exam: Blood pressure 134/52, pulse 70, temperature 98.3 F (36.8 C), temperature source Oral, resp. rate 20, height 5\' 7"  (1.702 m), weight 75.7 kg (166 lb 14.2 oz), SpO2 100.00%.    General: Well developed, well nourished, in no acute distress. Head: Normocephalic, atraumatic, sclera non-icteric, no xanthomas, nares are without discharge.  Neck: Faint left carotid bruit. JVP to jaw.  Lungs: Clear bilaterally to auscultation without wheezes, rales, or rhonchi. Breathing is unlabored. Heart: RRR with S1 S2. No murmurs, rubs, or gallops appreciated. Abdomen: Soft, non-tender, non-distended with normoactive bowel sounds. No hepatomegaly. No rebound/guarding. No obvious abdominal masses. Msk:  Strength and tone appears normal for age. Extremities: R foot amputation, LLE ulceration, left foot erythema. Trace bilateral LE edema. No clubbing or cyanosis.  Distal pedal pulses are 2+ L/1+ R Neuro: Alert and oriented X 3. Moves all extremities spontaneously. Psych:  Responds to questions appropriately with a normal affect.  Labs: Recent Labs  Basename 05/25/12 0545 05/24/12 1432   WBC 14.4* 16.7*   HGB 9.6* 10.7*   HCT 29.9* 33.1*   MCV 89.0 89.2   PLT 299 301    Lab 05/25/12 0545 05/24/12 1432 05/22/12 0645 05/20/12 0440  NA 136 -- 136 132*  K 4.0 -- 3.5 4.7  CL 104 -- 101 103  CO2 24 -- 23 21  BUN 20 -- 32* 30*  CREATININE 1.11* 1.00 1.30* --  CALCIUM 9.2 -- 9.3 9.3  PROT -- -- -- --  BILITOT -- -- -- --  ALKPHOS -- -- -- --  ALT -- -- -- --  AST -- -- -- --  AMYLASE -- -- -- --  LIPASE -- -- -- --  GLUCOSE 172* -- 189* 190*   Recent Labs  Basename 05/24/12 0807 05/24/12 0037 05/23/12 2000   CKTOTAL -- -- --   CKMB -- -- --   CKMBINDEX -- -- --   TROPONINI <0.30 <0.30 <0.30    Radiology/Studies: Dg Chest 2 View  05/15/2012  *RADIOLOGY REPORT*  Clinical Data: Fever.  CHEST - 2 VIEW  Comparison: 01/24/2012.  Findings: Brittney pacer wires are stable.  Brittney cardiac silhouette, mediastinal and hilar contours are mildly prominent but unchanged. There is persistent central vascular congestion and possible mild interstitial edema.  No pleural effusion or focal infiltrate.  Brittney bony thorax is intact.  IMPRESSION: Stable cardiac enlargement and central vascular congestion with possible mild interstitial edema.   Original Report Authenticated By: P. Loralie Champagne, M.D.    Dg Foot Complete Left  05/15/2012  *RADIOLOGY REPORT*  Clinical Data: Diabetic foot ulcers.  LEFT FOOT - COMPLETE 3+ VIEW  Comparison: None  Findings: Brittney joint spaces are maintained.  No acute bony findings or destructive bony changes.  IMPRESSION: No plain film findings for osteomyelitis.   Original Report Authenticated By: P. Loralie Champagne, M.D.     EKG: V-paced, 70 bpm, underlying atrial flutter  ASSESSMENT AND PLAN:  Brittney [name] IS CLEARED FOR VASCULAR SURGERY. (See Myrtis Ser notes below)  Brittney [name] has significant cardiac history in CAD, atrial fibrillation/flutter, history of heart block s/p St. Jude PPM, chronic diastolic CHF,  type 2 DM, HTN, HL, PVD, cerebrovascular disease and at least moderate valvular disease. From a functional standpoint, Brittney [name] was able to ambulate a considerable distance at baseline without being limited by symptoms prior to developing a R foot ulcer + sequelae. Brittney [name] has been relatively stable cardiac-wise denying ischemic, arrhythmic or increased heart failure type symptoms recently, noted also on recent follow-up with Dr. Myrtis Ser last month. Brittney [name] does have intermediate to high-risk cardiac features, which for Brittney most part are stable. Brittney [name]'s diastolic CHF does appear to be mildly decompensated.  On exam, Brittney [name] is mildly volume overloaded (increased JVP, trace bilateral peripheral  edema). Given mild renal insufficiency on BMET today, will gently diurese with Lasix 40mg  daily and monitor output. Brittney procedure itself is categorized as high-risk, however is potentially limb-saving and certainly warranted. After discussing with MD, no further cardiac work-up is planned at this time.   Signed, R. Hurman Horn, PA-C 05/25/2012, 4:57 PM  [name] seen and examined. Brittney [name] agree with Brittney assessment and plan as detailed above. See also my additional thoughts below.   Brittney [name] know this [name] very well. Brittney [name] have followed Brittney [name] for many years as an outpatient. Brittney [name] has good left ventricular function. Brittney [name] has only mild coronary artery disease. For many years we had significant difficulty with Brittney [name] atrial arrhythmias. Several different medications were used. Ultimately Brittney [name] had AV node ablation and a permanent pacemaker. Brittney [name] has done very well with this. Brittney [name] has had some problems with mild volume overload at times.  Brittney [name]'s fluids have been positive since admission. Brittney [name] now seems to have some mild shortness of breath and Brittney [name] believe Brittney [name] is mildly volume overloaded at this time. Brittney [name] is cleared for Brittney [name] vascular surgery. We will give Brittney [name] one dose of diuretic today. Brittney [name] has been on a diuretic regularly at home. Brittney [name] volume status can then be followed going forward along with Brittney [name] renal function. Brittney [name] feel that Brittney [name] can tolerate this surgery. Careful attention will have to be paid to Brittney [name] volume status. Plan to decide tomorrow if further diuretics should be given. Also repeat chest x-ray will be done to reassess Brittney [name] pulmonary status.  Brittney [name] does have underlying atrial fib and atrial flutter. With Brittney [name] AV node ablation this does not affect Brittney [name] left ventricular rate. However would be optimal for Brittney [name] to be anticoagulated. This also has been a difficult issue over time. At this point Brittney [name] is not anticoagulated. Over time in Brittney future Brittney [name] will discuss this issue with Brittney [name] again.  Willa Rough, MD, [facility] 05/25/2012 5:29  PM

## 2012-05-25 NOTE — Progress Notes (Addendum)
[facility]  Subjective  - POD #1, s/p angiogram  No complainsts today.  She feels her foot is getting worse   Physical Exam:  Left eft foot plantar ulcer with erythema on dorsum of foot Gen:  NAD Pulm: CTA       Assessment/Plan: Left leg ulcer  I have reviewed her angiogram.  She is not a candidate for percutaneous revascularization due to a near flush occlusion of her left SFA.  Therefore, she will require surgical bypass for limb salvage.  The will need to be either a femoral to above or below knee bypass.  I discussed this with the patient and her daughters at the bedside.  She understands that this is a limb threatening situation.  I have asked Dr. Myrtis Ser, her cardiologist to evaluate her for surgery.  She will need vein mapping which I have ordered.  I am out of town the rest of the week, so I will get one of my partners to follow her and possibly do her surgery if she is cleared from a cardcac perspective.  I have also ordered carotid dopplers.  BRABHAM IV, V. WELLS 05/25/2012 9:03 PM --  Filed Vitals:   05/25/12 1400  BP: 134/52  Pulse: 70  Temp: 98.3 F (36.8 C)  Resp: 20    Intake/Output Summary (Last 24 hours) at 05/25/12 2103 Last data filed at 05/25/12 1300  Gross per 24 hour  Intake    440 ml  Output      0 ml  Net    440 ml     Laboratory CBC    Component Value Date/Time   WBC 14.4* 05/25/2012 0545   HGB 9.6* 05/25/2012 0545   HCT 29.9* 05/25/2012 0545   PLT 299 05/25/2012 0545    BMET    Component Value Date/Time   NA 136 05/25/2012 0545   K 4.0 05/25/2012 0545   CL 104 05/25/2012 0545   CO2 24 05/25/2012 0545   GLUCOSE 172* 05/25/2012 0545   BUN 20 05/25/2012 0545   CREATININE 1.11* 05/25/2012 0545   CALCIUM 9.2 05/25/2012 0545   GFRNONAA 46* 05/25/2012 0545   GFRAA 54* 05/25/2012 0545    COAG Lab Results  Component Value Date   INR 1.63* 01/24/2012   INR 1.15 09/23/2011   INR 1.06 08/13/2011   No results found  for this basename: PTT    Antibiotics Anti-infectives     Start     Dose/Rate Route Frequency Ordered Stop   05/23/12 2000  piperacillin-tazobactam (ZOSYN) IVPB 3.375 g       3.375 g 12.5 mL/hr over 240 Minutes Intravenous 3 times per day 05/23/12 1919     05/23/12 0000   vancomycin (VANCOCIN) 750 mg in sodium chloride 0.9 % 150 mL IVPB        750 mg 150 mL/hr over 60 Minutes Intravenous Every 24 hours 05/22/12 1051     05/22/12 0000   vancomycin (VANCOCIN) IVPB 1000 mg/200 mL premix  Status:  Discontinued        1,000 mg 200 mL/hr over 60 Minutes Intravenous Every 24 hours 05/21/12 2354 05/22/12 1051           V. Charlena Cross, M.D. [facility] [place] Office: [phone] Pager:  [phone]

## 2012-05-25 NOTE — Progress Notes (Signed)
Clinical Social Work Department BRIEF PSYCHOSOCIAL ASSESSMENT 05/25/2012  Patient:  Brittney [name], Brittney [name]     Account Number:  [id number]     Admit date:  05/21/2012  Clinical Social Worker:  Dennison Bulla  Date/Time:  05/25/2012 03:00 PM  Referred by:  Physician  Date Referred:  05/25/2012 Referred for  SNF Placement   Other Referral:   Interview type:  Patient Other interview type:   Dtr present    PSYCHOSOCIAL DATA Living Status:  FACILITY Admitted from facility:   Level of care:  Independent Living Primary support name:  [name] Primary support relationship to patient:  CHILD, ADULT Degree of support available:   Strong    CURRENT CONCERNS Current Concerns  Post-Acute Placement   Other Concerns:    SOCIAL WORK ASSESSMENT / PLAN CSW received referral to assist with dc planning. Per MD, patient will most likely need SNF at dc. CSW reviewed chart and met with patient at bedside. Dtr present and patient agreeable to dtr being involved in assessment.    CSW introduced myself and explained role. Patient reports she lives at [facility] since June 2013. Patient reports she already has equipment at facility and that staff assist her as needed. Patient reports that she has not been ambulating well the past few days. CSW spoke with patient regarding SNF. Patient refusing SNF at this time. Dtr reports that patient has had HH in the past but would never want to go to SNF. CSW and patient discussed her safety and her ability to properly care for herself is she returns to independent living. Dtr agreeable to this plan and mentioned that if patient goes to SNF it would be difficult to pay for independent living and SNF copays. CSW agreed to follow patient while in hospital and to assist with dc needs.   Assessment/plan status:  Psychosocial Support/Ongoing Assessment of Needs Other assessment/ plan:   Information/referral to community resources:   SNF information    PATIENT'S/FAMILY'S  RESPONSE TO PLAN OF CARE: Patient alert and oriented. Patient engaged throughout assessment. Dtr and patient refusing SNF at this time. Patient agreeable to CSW following her throughout hospital stay.

## 2012-05-25 NOTE — Progress Notes (Signed)
[facility] PROGRESS NOTE  Brittney [name] ZOX:[id number] DOB: 1931-12-12 DOA: 05/21/2012 PCP: [name], MD  Assessment/Plan: Principal Problem:  *Osteomyelitis Active Problems:  Hypertension  Dyslipidemia  CAD (coronary artery disease)  Diabetes mellitus  Dementia  PAD (peripheral artery disease)  Cellulitis  Diabetic foot ulcer  Hyponatremia  1. Osteomyelitis of the foot on the left , with peripheral vascular disease in the same lower extremities.   Continue IV antibiotics for now  Arterial duplex showed:  PVD L>R. Aortogram with severe distal PVd in the leg . Continue iv antibiotics awaiting decision in regards to amputation.  2. Diabetes mellitus-sliding scale insulin while in house 3. Coronary artery disease with status post cardiac catheterization in 2010 revealing nonobstructive multiple vessel disease.  4. Hypothyroidism-resumed Synthroid 5. CKD- baseline is about 1.3 (range 1-2). Need to monitor closely renal function post aortogram .  6. Hx of osteo /PVD on the right foot s/p right midfoot amputation.  7. Anemia due to chronic renal ds  Code Status: full Family Communication: daughter at bedside Disposition Plan: may need SNF    78 year old woman admitted for worsening osteomyelitis cellulitis of the left foot. She also has peripheral vascular disease in the left lower extremity   Consultants:  Dr. Myra Gianotti from vascular surgery   Procedures:  Arterial duplex of the lower extremities bilaterally  Aortogram   Right  Left   RA  Patent, long length, lower kidney position than usual  Patent renal stent, nephrogram is patent   CIA  patent  patent   EIA  patent  patent   IIA  patent  patent   CFA  patent  patent   SFA  Patent SFA stent with 50 - >75% stenoses present in-stent  Flush occlusion at takeoff, distal SFA reconstitutes from profunda branches   PFA  Patent  Patent   Pop  Patent  Patent, disease and calcified above the knee segment   Trif   Patent but diseased with 75=90% stenosis  No AT or PT takeoff evident   AT  Patent, runoff to foot  Occluded   Pero  Occluded proximally, reconstitutes via branches distally, evident as runoff to foot distally  Patent, only runoff to left foot   PT  Occluded  Occluded   SPECIMEN(S): none   Antibiotics:  Vancomycin 9/13//13 -  Zosyn 05/23/12 ----   HPI/Subjective: No new events     Objective: Filed Vitals:   05/24/12 2117 05/25/12 0156 05/25/12 0556 05/25/12 1000  BP: 139/50 139/57 122/60 122/46  Pulse: 70 69 69 70  Temp: 98.8 F (37.1 C) 98.5 F (36.9 C) 99.7 F (37.6 C) 98.3 F (36.8 C)  TempSrc: Oral Oral Oral Oral  Resp: 20 20 20 19   Height:      Weight:      SpO2: 99% 100% 92% 99%    Intake/Output Summary (Last 24 hours) at 05/25/12 1120 Last data filed at 05/25/12 0004  Gross per 24 hour  Intake   1440 ml  Output    200 ml  Net   1240 ml   Filed Weights   05/21/12 2204  Weight: 75.7 kg (166 lb 14.2 oz)    Exam:   General:  Pleasant and cooperative, NAD  Cardiovascular: rrr  Respiratory: clear anterior, no wheezing  Abdomen: +BS, soft, NT/ND Skin: Left foot with plantar necrotic eschar surrounded by erythema with foul smell    Data Reviewed: Basic Metabolic Panel:  Lab 05/25/12 8119 05/24/12 1432 05/22/12 0645 05/20/12 0440 05/19/12  0443  NA 136 -- 136 132* 134*  K 4.0 -- 3.5 4.7 4.6  CL 104 -- 101 103 105  CO2 24 -- 23 21 23   GLUCOSE 172* -- 189* 190* 243*  BUN 20 -- 32* 30* 28*  CREATININE 1.11* 1.00 1.30* 1.03 1.10  CALCIUM 9.2 -- 9.3 9.3 8.9  MG -- -- -- -- --  PHOS -- -- -- -- --   Liver Function Tests: No results found for this basename: AST:5,ALT:5,ALKPHOS:5,BILITOT:5,PROT:5,ALBUMIN:5 in the last 168 hours No results found for this basename: LIPASE:5,AMYLASE:5 in the last 168 hours No results found for this basename: AMMONIA:5 in the last 168 hours CBC:  Lab 05/25/12 0545 05/24/12 1432 05/22/12 0645 05/22/12 0045  WBC 14.4*  16.7* 13.4* 14.3*  NEUTROABS -- -- -- 9.2*  HGB 9.6* 10.7* 10.9* 10.1*  HCT 29.9* 33.1* 33.5* 30.9*  MCV 89.0 89.2 88.2 87.0  PLT 299 301 268 254   Cardiac Enzymes:  Lab 05/24/12 0807 05/24/12 0037 05/23/12 2000  CKTOTAL -- -- --  CKMB -- -- --  CKMBINDEX -- -- --  TROPONINI <0.30 <0.30 <0.30   BNP (last 3 results)  Basename 01/24/12 2134 09/23/11 1200 08/13/11 1349  PROBNP 3488.0* 3559.0* 2562.0*   CBG:  Lab 05/25/12 0746 05/24/12 2120 05/24/12 1640 05/24/12 1208 05/24/12 0805  GLUCAP 158* 192* 195* 207* 202*    Recent Results (from the past 240 hour(s))  URINE CULTURE     Status: Normal   Collection Time   05/15/12  3:10 PM      Component Value Range Status Comment   Specimen Description URINE, CLEAN CATCH   Final    Special Requests NONE   Final    Culture  Setup Time 05/15/2012 20:48   Final    Colony Count 5,000 COLONIES/ML   Final    Culture INSIGNIFICANT GROWTH   Final    Report Status 05/16/2012 FINAL   Final   CULTURE, BLOOD (ROUTINE X 2)     Status: Normal   Collection Time   05/15/12  4:46 PM      Component Value Range Status Comment   Specimen Description BLOOD LEFT ARM   Final    Special Requests     Final    Value: BOTTLES DRAWN AEROBIC AND ANAEROBIC 8CC EACH BOTTLE   Culture NO GROWTH 5 DAYS   Final    Report Status 05/20/2012 FINAL   Final   CULTURE, BLOOD (ROUTINE X 2)     Status: Normal   Collection Time   05/15/12  4:51 PM      Component Value Range Status Comment   Specimen Description BLOOD LEFT HAND   Final    Special Requests     Final    Value: BOTTLES DRAWN AEROBIC AND ANAEROBIC 8CC EACH BOTTLE   Culture NO GROWTH 5 DAYS   Final    Report Status 05/20/2012 FINAL   Final   MRSA PCR SCREENING     Status: Abnormal   Collection Time   05/15/12 10:20 PM      Component Value Range Status Comment   MRSA by PCR POSITIVE (*) NEGATIVE Final   CULTURE, BLOOD (ROUTINE X 2)     Status: Normal (Preliminary result)   Collection Time   05/23/12  8:00 PM        Component Value Range Status Comment   Specimen Description BLOOD LEFT HAND   Final    Special Requests BOTTLES DRAWN AEROBIC ONLY 10CC  Final    Culture  Setup Time 05/24/2012 14:17   Final    Culture     Final    Value:        BLOOD CULTURE RECEIVED NO GROWTH TO DATE CULTURE WILL BE HELD FOR 5 DAYS BEFORE ISSUING A FINAL NEGATIVE REPORT   Report Status PENDING   Incomplete   CULTURE, BLOOD (ROUTINE X 2)     Status: Normal (Preliminary result)   Collection Time   05/23/12  8:14 PM      Component Value Range Status Comment   Specimen Description BLOOD RIGHT HAND   Final    Special Requests BOTTLES DRAWN AEROBIC ONLY 10CC   Final    Culture  Setup Time 05/24/2012 14:18   Final    Culture     Final    Value:        BLOOD CULTURE RECEIVED NO GROWTH TO DATE CULTURE WILL BE HELD FOR 5 DAYS BEFORE ISSUING A FINAL NEGATIVE REPORT   Report Status PENDING   Incomplete      Studies: No results found.  Scheduled Meds:    . enoxaparin  40 mg Subcutaneous Q24H  . heparin      . hydrALAZINE      . insulin aspart  0-5 Units Subcutaneous QHS  . insulin aspart  0-9 Units Subcutaneous TID WC  . insulin glargine  15 Units Subcutaneous QHS  . levothyroxine  75 mcg Oral Daily  . lidocaine      . piperacillin-tazobactam (ZOSYN)  IV  3.375 g Intravenous Q8H  . sertraline  100 mg Oral Daily  . Travoprost (BAK Free)  1 drop Both Eyes QHS  . vancomycin  750 mg Intravenous Q24H  . vitamin C  1,000 mg Oral Daily  . DISCONTD: enoxaparin (LOVENOX) injection  40 mg Subcutaneous Q24H   Continuous Infusions:    . sodium chloride 1 mL/kg/hr (05/24/12 1658)    Principal Problem:  *Osteomyelitis Active Problems:  Hypertension  Dyslipidemia  CAD (coronary artery disease)  Diabetes mellitus  Dementia  PAD (peripheral artery disease)  Cellulitis  Diabetic foot ulcer  Hyponatremia      Brittney [name]  [facility] Pager [phone]  05/25/2012, 11:20 AM  LOS: 4 days

## 2012-05-26 DIAGNOSIS — Z01810 Encounter for preprocedural cardiovascular examination: Secondary | ICD-10-CM

## 2012-05-26 DIAGNOSIS — L089 Local infection of the skin and subcutaneous tissue, unspecified: Secondary | ICD-10-CM

## 2012-05-26 DIAGNOSIS — E11628 Type 2 diabetes mellitus with other skin complications: Secondary | ICD-10-CM

## 2012-05-26 DIAGNOSIS — I4891 Unspecified atrial fibrillation: Secondary | ICD-10-CM

## 2012-05-26 LAB — CBC
MCV: 90.3 fL (ref 78.0–100.0)
Platelets: 323 10*3/uL (ref 150–400)
RBC: 3.31 MIL/uL — ABNORMAL LOW (ref 3.87–5.11)
WBC: 14.7 10*3/uL — ABNORMAL HIGH (ref 4.0–10.5)

## 2012-05-26 LAB — BASIC METABOLIC PANEL
BUN: 17 mg/dL (ref 6–23)
CO2: 27 mEq/L (ref 19–32)
Calcium: 9 mg/dL (ref 8.4–10.5)
GFR calc Af Amer: 50 mL/min — ABNORMAL LOW (ref >90)
GFR calc Af Amer: 49 mL/min — ABNORMAL LOW (ref >90)
GFR calc non Af Amer: 43 mL/min — ABNORMAL LOW (ref >90)
Potassium: 4.1 mEq/L (ref 3.5–5.1)
Sodium: 134 mEq/L — ABNORMAL LOW (ref 135–145)
Sodium: 134 mEq/L — ABNORMAL LOW (ref 135–145)

## 2012-05-26 LAB — GLUCOSE, CAPILLARY
Glucose-Capillary: 267 mg/dL — ABNORMAL HIGH (ref 70–99)
Glucose-Capillary: 199 mg/dL — ABNORMAL HIGH (ref 70–99)

## 2012-05-26 MED ORDER — FUROSEMIDE 10 MG/ML IJ SOLN
40.0000 mg | Freq: Once | INTRAMUSCULAR | Status: AC
  Administered 2012-05-26: 40 mg via INTRAVENOUS
  Filled 2012-05-26: qty 4

## 2012-05-26 MED ORDER — INSULIN GLARGINE 100 UNIT/ML ~~LOC~~ SOLN
21.0000 [IU] | Freq: Every day | SUBCUTANEOUS | Status: DC
  Administered 2012-05-26: 21 [IU] via SUBCUTANEOUS

## 2012-05-26 MED ORDER — OXYCODONE HCL 10 MG PO TB12
10.0000 mg | ORAL_TABLET | Freq: Two times a day (BID) | ORAL | Status: DC
  Administered 2012-05-26 – 2012-05-28 (×4): 10 mg via ORAL
  Filled 2012-05-26 (×4): qty 1

## 2012-05-26 NOTE — Progress Notes (Signed)
Appreciate Cardiology input regarding her cardiac issues.  We will plan on proceeding with femoral popliteal bypass.  She is still awaiting vein mapping.  I am trying to get her scheduled.  This may have to be done next week.   Brittney [name]

## 2012-05-26 NOTE — Progress Notes (Addendum)
[facility] PROGRESS NOTE  Brittney [name] JXB:[id number] DOB: [birth year] DOA: 05/21/2012 PCP: [name], MD  Assessment/Plan: Diabetic foot infection -MRI not possible due to the patient's pacemaker -Suspect underlying abscess/osteomyelitis -May need nuclear medicine WBC tag scan -Continue vancomycin and Zosyn Severe peripheral vascular disease -Dr. Margarette Asal input noted Diastolic CHF -Continue strict I/Os, furosemide CKD stage III -Creatinine for the most part stable Diabetes mellitus type 2 -Increase Lantus to 21 units -Pre-meal insulin if patient increase his by mouth intake Hypothyroidism -Continue Synthroid - Check TSH Atrial fib -does not appear to be good warfarin candidate due to falls, poor insight -ASA after surgeries    Procedures/Studies: Dg Chest 2 View  05/15/2012  *RADIOLOGY REPORT*  Clinical Data: Fever.  CHEST - 2 VIEW  Comparison: 01/24/2012.  Findings: The pacer wires are stable.  The cardiac silhouette, mediastinal and hilar contours are mildly prominent but unchanged. There is persistent central vascular congestion and possible mild interstitial edema.  No pleural effusion or focal infiltrate.  The bony thorax is intact.  IMPRESSION: Stable cardiac enlargement and central vascular congestion with possible mild interstitial edema.   Original Report Authenticated By: P. Loralie Champagne, M.D.    Dg Chest Port 1 View  05/25/2012  *RADIOLOGY REPORT*  Clinical Data: PICC line placement.  PORTABLE CHEST - 1 VIEW  Comparison: 05/21/2012  Findings: Right-sided PICC line noted with tip projecting over the SVC.  No pneumothorax or complicating feature.  Dual lead pacer noted.  There is mild cardiomegaly along with stable interstitial accentuation.  Peripheral airspace opacity in the right mid lung appears slightly less striking.  Atherosclerotic calcification in the aortic arch noted.  IMPRESSION:  1.  Right PICC line tip:  SVC.  No pneumothorax or complicating  feature. 2.  Cardiomegaly with mild interstitial accentuation, suspicious for mild interstitial edema. 3.  Faint airspace disease peripherally in the right mid lung appears improved.   Original Report Authenticated By: [name], M.D.    Dg Foot Complete Left  05/15/2012  *RADIOLOGY REPORT*  Clinical Data: Diabetic foot ulcers.  LEFT FOOT - COMPLETE 3+ VIEW  Comparison: None  Findings: The joint spaces are maintained.  No acute bony findings or destructive bony changes.  IMPRESSION: No plain film findings for osteomyelitis.   Original Report Authenticated By: P. Loralie Champagne, M.D.         Code Status: Full Family Communication: daughter at bedside Disposition Plan: SNF when medically stable, if pt agrees  Subjective: Patient is breathing better today. She denies any chest pain, shortness breath, nausea, vomiting, diarrhea, abdominal pain. She is complaining of some pain in her left foot.  Objective: Filed Vitals:   05/26/12 0516 05/26/12 1041 05/26/12 1427 05/26/12 1812  BP: 151/62 141/52 137/52 152/57  Pulse: 70 70 69 69  Temp: 98.2 F (36.8 C) 98.7 F (37.1 C) 99.4 F (37.4 C) 98.8 F (37.1 C)  TempSrc: Oral Oral Oral Oral  Resp: 18 20 20 18   Height:      Weight: 70.3 kg (154 lb 15.7 oz)     SpO2: 100% 99% 99% 100%    Intake/Output Summary (Last 24 hours) at 05/26/12 1927 Last data filed at 05/26/12 1814  Gross per 24 hour  Intake    770 ml  Output   2300 ml  Net  -1530 ml   Weight change:  Exam:   General:  Pt is alert, follows commands appropriately, not in acute distress  HEENT: No icterus, No thrush, No neck mass,  [place]/AT  Cardiovascular: IRRR, S1/S2, no rubs, no gallops  Respiratory: bibasilar crackles  Abdomen: Soft, non tender, non distended, bowel sounds present, no guarding Extremities: Left foot fifth metatarsophalangeal joint area with purulent drainage. No crepitance. There is erythema surrounding the eschar lesion. Data Reviewed: Basic  Metabolic Panel:  Lab 05/26/12 1610 05/26/12 0500 05/25/12 0545 05/24/12 1432 05/22/12 0645 05/20/12 0440  NA 134* 134* 136 -- 136 132*  K 4.1 4.2 4.0 -- 3.5 4.7  CL 100 101 104 -- 101 103  CO2 27 27 24  -- 23 21  GLUCOSE 297* 265* 172* -- 189* 190*  BUN 17 19 20  -- 32* 30*  CREATININE 1.19* 1.17* 1.11* 1.00 1.30* --  CALCIUM 8.8 9.0 9.2 -- 9.3 9.3  MG -- -- -- -- -- --  PHOS -- -- -- -- -- --   Liver Function Tests: No results found for this basename: AST:5,ALT:5,ALKPHOS:5,BILITOT:5,PROT:5,ALBUMIN:5 in the last 168 hours No results found for this basename: LIPASE:5,AMYLASE:5 in the last 168 hours No results found for this basename: AMMONIA:5 in the last 168 hours CBC:  Lab 05/26/12 0500 05/25/12 0545 05/24/12 1432 05/22/12 0645 05/22/12 0045  WBC 14.7* 14.4* 16.7* 13.4* 14.3*  NEUTROABS -- -- -- -- 9.2*  HGB 9.8* 9.6* 10.7* 10.9* 10.1*  HCT 29.9* 29.9* 33.1* 33.5* 30.9*  MCV 90.3 89.0 89.2 88.2 87.0  PLT 323 299 301 268 254   Cardiac Enzymes:  Lab 05/24/12 0807 05/24/12 0037 05/23/12 2000  CKTOTAL -- -- --  CKMB -- -- --  CKMBINDEX -- -- --  TROPONINI <0.30 <0.30 <0.30   BNP: No components found with this basename: POCBNP:5 CBG:  Lab 05/26/12 1715 05/26/12 1205 05/26/12 0901 05/25/12 2153 05/25/12 1649  GLUCAP 132* 257* 267* 284* 200*    Recent Results (from the past 240 hour(s))  CULTURE, BLOOD (ROUTINE X 2)     Status: Normal (Preliminary result)   Collection Time   05/23/12  8:00 PM      Component Value Range Status Comment   Specimen Description BLOOD LEFT HAND   Final    Special Requests BOTTLES DRAWN AEROBIC ONLY 10CC   Final    Culture  Setup Time 05/24/2012 14:17   Final    Culture     Final    Value:        BLOOD CULTURE RECEIVED NO GROWTH TO DATE CULTURE WILL BE HELD FOR 5 DAYS BEFORE ISSUING A FINAL NEGATIVE REPORT   Report Status PENDING   Incomplete   CULTURE, BLOOD (ROUTINE X 2)     Status: Normal (Preliminary result)   Collection Time   05/23/12   8:14 PM      Component Value Range Status Comment   Specimen Description BLOOD RIGHT HAND   Final    Special Requests BOTTLES DRAWN AEROBIC ONLY 10CC   Final    Culture  Setup Time 05/24/2012 14:18   Final    Culture     Final    Value:        BLOOD CULTURE RECEIVED NO GROWTH TO DATE CULTURE WILL BE HELD FOR 5 DAYS BEFORE ISSUING A FINAL NEGATIVE REPORT   Report Status PENDING   Incomplete      Scheduled Meds:   . furosemide  40 mg Intravenous Once  . insulin aspart  0-5 Units Subcutaneous QHS  . insulin aspart  0-9 Units Subcutaneous TID WC  . insulin glargine  15 Units Subcutaneous QHS  . levothyroxine  75 mcg Oral Daily  .  piperacillin-tazobactam (ZOSYN)  IV  3.375 g Intravenous Q8H  . sertraline  100 mg Oral Daily  . Travoprost (BAK Free)  1 drop Both Eyes QHS  . vancomycin  750 mg Intravenous Q24H  . vitamin C  1,000 mg Oral Daily  . DISCONTD: furosemide  40 mg Oral Daily   Continuous Infusions:    Mitchell Epling, DO  [facility] Pager [phone]  If 7PM-7AM, please contact night-coverage [email or web address] Password [email or web address] 05/26/2012, 7:27 PM   LOS: 5 days

## 2012-05-26 NOTE — Progress Notes (Signed)
ANTIBIOTIC CONSULT NOTE - Follow Up  Pharmacy Consult for vancomycin and zosyn  Indication: cellulitis  Allergies  Allergen Reactions  . Amiodarone     Autonomic dysfunction  . Morphine Sulfate     REACTION: jerking  . Oxycodone Hcl Itching and Other (See Comments)    Feels weird    Patient Measurements: Height: 5\' 7"  (170.2 cm) Weight: 154 lb 15.7 oz (70.3 kg) IBW/kg (Calculated) : 61.6   Vital Signs: Temp: 98.2 F (36.8 C) (09/18 0516) Temp src: Oral (09/18 0516) BP: 151/62 mmHg (09/18 0516) Pulse Rate: 70  (09/18 0516)  Labs:  Basename 05/26/12 0500 05/25/12 0545 05/24/12 1432  WBC 14.7* 14.4* 16.7*  HGB 9.8* 9.6* 10.7*  PLT 323 299 301  LABCREA -- -- --  CREATININE 1.17* 1.11* 1.00   Estimated Creatinine Clearance: 38.5 ml/min (by C-G formula based on Cr of 1.17).  Microbiology: Recent Results (from the past 720 hour(s))  URINE CULTURE     Status: Normal   Collection Time   05/15/12  3:10 PM      Component Value Range Status Comment   Specimen Description URINE, CLEAN CATCH   Final    Special Requests NONE   Final    Culture  Setup Time 05/15/2012 20:48   Final    Colony Count 5,000 COLONIES/ML   Final    Culture INSIGNIFICANT GROWTH   Final    Report Status 05/16/2012 FINAL   Final   CULTURE, BLOOD (ROUTINE X 2)     Status: Normal   Collection Time   05/15/12  4:46 PM      Component Value Range Status Comment   Specimen Description BLOOD LEFT ARM   Final    Special Requests     Final    Value: BOTTLES DRAWN AEROBIC AND ANAEROBIC 8CC EACH BOTTLE   Culture NO GROWTH 5 DAYS   Final    Report Status 05/20/2012 FINAL   Final   CULTURE, BLOOD (ROUTINE X 2)     Status: Normal   Collection Time   05/15/12  4:51 PM      Component Value Range Status Comment   Specimen Description BLOOD LEFT HAND   Final    Special Requests     Final    Value: BOTTLES DRAWN AEROBIC AND ANAEROBIC 8CC EACH BOTTLE   Culture NO GROWTH 5 DAYS   Final    Report Status 05/20/2012 FINAL    Final   MRSA PCR SCREENING     Status: Abnormal   Collection Time   05/15/12 10:20 PM      Component Value Range Status Comment   MRSA by PCR POSITIVE (*) NEGATIVE Final   CULTURE, BLOOD (ROUTINE X 2)     Status: Normal (Preliminary result)   Collection Time   05/23/12  8:00 PM      Component Value Range Status Comment   Specimen Description BLOOD LEFT HAND   Final    Special Requests BOTTLES DRAWN AEROBIC ONLY 10CC   Final    Culture  Setup Time 05/24/2012 14:17   Final    Culture     Final    Value:        BLOOD CULTURE RECEIVED NO GROWTH TO DATE CULTURE WILL BE HELD FOR 5 DAYS BEFORE ISSUING A FINAL NEGATIVE REPORT   Report Status PENDING   Incomplete   CULTURE, BLOOD (ROUTINE X 2)     Status: Normal (Preliminary result)   Collection Time   05/23/12  8:14 PM      Component Value Range Status Comment   Specimen Description BLOOD RIGHT HAND   Final    Special Requests BOTTLES DRAWN AEROBIC ONLY 10CC   Final    Culture  Setup Time 05/24/2012 14:18   Final    Culture     Final    Value:        BLOOD CULTURE RECEIVED NO GROWTH TO DATE CULTURE WILL BE HELD FOR 5 DAYS BEFORE ISSUING A FINAL NEGATIVE REPORT   Report Status PENDING   Incomplete     Medical History: Past Medical History  Diagnosis Date  . Hypertension   . Dyslipidemia   . CAD (coronary artery disease)     Non-STEMI August, 2010,,,, 40% left main, 30% LAD, 60% OM 2, 40% RCA, 90% OM1-culprit lesion-too small for intervention           . Ejection fraction     EF 55% ,2011 / EF 30% with tachycardia cardiomyopathy / EF 50% January, 2012  . Diabetes mellitus   . Dementia     ?? Early dementia ?? 2012  . GERD (gastroesophageal reflux disease)   . TIA (transient ischemic attack)     History of TIAs  . Hypothyroidism   . PAD (peripheral artery disease)     Carotid endarterectomy, right. Stents right peroneal, right superficial, right popliteal arteries, Dr. Edilia Bo  . Amputation     Right midfoot 2010  . CKD (chronic  kidney disease)     Creatinine 1.19 at December 2012 discharge  . Gait disorder   . Vertebrobasilar insufficiency   . Depression   . Hyperkalemia     ACE Inhibitor held  . Acute renal insufficiency     Catheterization August 2010  . Blindness of right eye   . Respiratory failure     Hypoxic and hypercapnic, 2011, etiology pneumonia  . Orthostatic hypotension     Syncope January 2012 with facial trauma, amiodarone stopped  . Atrial fibrillation 8/10    on pradaxa, s/p AV nodal ablation  . Ventricular tachycardia     20 beats December, 2012, asymptomatic  . Atrial flutter 05/24/09    Hospitalization in [place]; with difficult rate control; TEE cardioversion done EF 30%, secondary to tachycardia, pt did convert back tosinus rhythm; return 10/11, amiodarone restarted 10/11, no longer on it. Recurrence with RVR 08/2011.  [name] Anemia in CKD (chronic kidney disease)   . Gastroparesis 3/11    Abnormal GES  . Drug therapy,  Sotolol     Sotolol started 08/2011.  . Drug therapy     Pradaxa January, 2013  . Complete heart block     s/p AV nodal ablation  . Chronic diastolic CHF (congestive heart failure)     echo 03/2012- LVEF 55-60%, grade 2 diastolic dysfunction, mild LA dilatation, mild MR, paradoxical vent septal motion  . On home O2     prn    Medications:  Prescriptions prior to admission  Medication Sig Dispense Refill  . acetaminophen (TYLENOL) 500 MG tablet Take 500 mg by mouth every 6 (six) hours as needed. For pain      . Ascorbic Acid (VITAMIN C) 1000 MG tablet Take 1,000 mg by mouth daily.        . furosemide (LASIX) 40 MG tablet Take 1 tablet (40 mg total) by mouth 2 (two) times daily.  60 tablet  3  . insulin aspart (NOVOLOG) 100 UNIT/ML injection Inject 5-9 Units into the skin 5 (  five) times daily. Per sliding scale.  90-200: 5 units, 201-250: 6 units, 251-300: 7 units, 301-350: 8 units, 351-400: 9 units      . insulin glargine (LANTUS) 100 UNIT/ML injection Inject 15  Units into the skin at bedtime.        [name] levothyroxine (SYNTHROID, LEVOTHROID) 75 MCG tablet Take 75 mcg by mouth daily.        [name] linagliptin (TRADJENTA) 5 MG TABS tablet Take 5 mg by mouth daily.      [name] lisinopril (PRINIVIL) 10 MG tablet Take 1 tablet (10 mg total) by mouth daily.  30 tablet  3  . nitroGLYCERIN (NITROSTAT) 0.4 MG SL tablet Place 0.4 mg under the tongue every 5 (five) minutes as needed. For chest pain      . sertraline (ZOLOFT) 100 MG tablet Take 100 mg by mouth daily.        [name] sulfamethoxazole-trimethoprim (BACTRIM DS) 800-160 MG per tablet Take 1 tablet by mouth 2 (two) times daily.      . travoprost, benzalkonium, (TRAVATAN) 0.004 % ophthalmic solution Place 1 drop into both eyes at bedtime.          Assessment: 78yo female was discharged from [facility] 9/12 had been on vanc for cellulitis and improved, sent home on PO ABX, now admitted to [facility] and to resume vancomycin and start zosyn for cellulitis; of note pt has h/o MRSA infection.  Pt was previously slightly subtherapeutic on vancomycin 750mg  IV Q24H, patient is afebrile and CrCl has been ~38ml/min. Blood cultures since 9/15 are NGTD.   Goal of Therapy:  Vancomycin trough level 10-15 mcg/ml  Plan:  1. Continue Vancomycin at 750mg  IV q24h 2. Obtain Vancomycin trough prior to next dose on 9/19 3. Continue zosyn at 3.375g q8h  Thank you,  Brett Fairy, PharmD

## 2012-05-26 NOTE — Progress Notes (Signed)
SUBJECTIVE:  Mild SOB today.     PHYSICAL EXAM Filed Vitals:   05/25/12 1400 05/25/12 2105 05/26/12 0202 05/26/12 0516  BP: 134/52 131/57 142/49 151/62  Pulse: 70 70 70 70  Temp: 98.3 F (36.8 C) 99 F (37.2 C) 99.3 F (37.4 C) 98.2 F (36.8 C)  TempSrc: Oral Oral Oral Oral  Resp: 20 20 18 18   Height:      Weight:    154 lb 15.7 oz (70.3 kg)  SpO2: 100% 92% 94% 100%   General:  No distress Lungs:  Few basilar crackles Heart:  RRR Abdomen:  Positive bowel sounds, no rebound no guarding Extremities:  Mild edema.    LABS:  Results for orders placed during the hospital encounter of 05/21/12 (from the past 24 hour(s))  GLUCOSE, CAPILLARY     Status: Abnormal   Collection Time   05/25/12  7:46 AM      Component Value Range   Glucose-Capillary 158 (*) 70 - 99 mg/dL  GLUCOSE, CAPILLARY     Status: Abnormal   Collection Time   05/25/12 11:47 AM      Component Value Range   Glucose-Capillary 205 (*) 70 - 99 mg/dL  GLUCOSE, CAPILLARY     Status: Abnormal   Collection Time   05/25/12  4:49 PM      Component Value Range   Glucose-Capillary 200 (*) 70 - 99 mg/dL  GLUCOSE, CAPILLARY     Status: Abnormal   Collection Time   05/25/12  9:53 PM      Component Value Range   Glucose-Capillary 284 (*) 70 - 99 mg/dL  BASIC METABOLIC PANEL     Status: Abnormal   Collection Time   05/26/12  5:00 AM      Component Value Range   Sodium 134 (*) 135 - 145 mEq/L   Potassium 4.2  3.5 - 5.1 mEq/L   Chloride 101  96 - 112 mEq/L   CO2 27  19 - 32 mEq/L   Glucose, Bld 265 (*) 70 - 99 mg/dL   BUN 19  6 - 23 mg/dL   Creatinine, Ser 1.61 (*) 0.50 - 1.10 mg/dL   Calcium 9.0  8.4 - 09.6 mg/dL   GFR calc non Af Amer 43 (*) >90 mL/min   GFR calc Af Amer 50 (*) >90 mL/min  CBC     Status: Abnormal   Collection Time   05/26/12  5:00 AM      Component Value Range   WBC 14.7 (*) 4.0 - 10.5 K/uL   RBC 3.31 (*) 3.87 - 5.11 MIL/uL   Hemoglobin 9.8 (*) 12.0 - 15.0 g/dL   HCT 04.5 (*) 40.9 - 81.1 %     MCV 90.3  78.0 - 100.0 fL   MCH 29.6  26.0 - 34.0 pg   MCHC 32.8  30.0 - 36.0 g/dL   RDW 91.4  78.2 - 95.6 %   Platelets 323  150 - 400 K/uL    Intake/Output Summary (Last 24 hours) at 05/26/12 0732 Last data filed at 05/26/12 0203  Gross per 24 hour  Intake    240 ml  Output    100 ml  Net    140 ml    ASSESSMENT AND PLAN:  PVD For surgery.  We are following for perioperative management of volume and rhythm primarily.  CAD (coronary artery disease) Mild Plaque in 2010. No active angina.  No further work up.  CHF (congestive heart failure) She seems  to have some crackles and dyspnea.  I will give her her Lasix IV today.  This will need to be dosed again tomorrow.  She is incontinent and requesting a Foley.  We need strict I/Os  Atrial fibrillation  Currently not on anticoagulation and Dr. Myrtis Ser will discuss this with her further in the future.   Rollene Rotunda 05/26/2012 7:32 AM

## 2012-05-26 NOTE — Progress Notes (Signed)
Inpatient Diabetes Program Recommendations  AACE/ADA: New Consensus Statement on Inpatient Glycemic Control (2013)  Target Ranges:  Prepandial:   less than 140 mg/dL      Peak postprandial:   less than 180 mg/dL (1-2 hours)      Critically ill patients:  140 - 180 mg/dL   Results for JEANEANE, BARNIER (MRN [id number]) as of 05/26/2012 13:33  Ref. Range 05/25/2012 07:46 05/25/2012 11:47 05/25/2012 16:49 05/25/2012 21:53  Glucose-Capillary Latest Range: 70-99 mg/dL 782 (H) 956 (H) 213 (H) 284 (H)   Results for CATERINA, BEINE (MRN [id number]) as of 05/26/2012 13:33  Ref. Range 05/26/2012 09:01 05/26/2012 12:05  Glucose-Capillary Latest Range: 70-99 mg/dL 629 (H) 528 (H)   Fasting CBG elevated today.  Postprandial CBGs also elevated.  Inpatient Diabetes Program Recommendations Insulin - Basal: Please consider increasing Lantus to 20 units QHS. Insulin - Meal Coverage: Please consider adding meal coverage- Novolog 4 units tid with meals.  Note: Will follow. Ambrose Finland RN, MSN, CDE Diabetes Coordinator Inpatient Diabetes Program [phone]

## 2012-05-26 NOTE — Progress Notes (Addendum)
Right:  No evidence of significant ICA stenosis.  Left:  40-59% internal carotid artery stenosis.  Bilateral:  Vertebral artery flow is antegrade.  VASCULAR LAB PRELIMINARY  PRELIMINARY  PRELIMINARY  PRELIMINARY  Right Lower Extremity Vein Map    Right Great Saphenous Vein   Segment Diameter Comment  1. Origin 7 mm   2. High Thigh 3.4 mm branch  3. Mid Thigh 2.6 mm branch  4. Low Thigh 2.5 mm   5. At Knee 2 mm   6. High Calf 2.5 mm   7. Low Calf 1.8 mm branch  8. Ankle 1.5 mm tortuous   mm    mm    mm    Left Lower Extremity Vein Map    Left Great Saphenous Vein   Segment Diameter Comment  1. Origin 8.7 mm   2. High Thigh 4.3 mm branch  3. Mid Thigh 4.5 mm   4. Low Thigh 4.1 mm   5. At Knee 4.3 mm   6. High Calf 4.2 mm branch  7. Low Calf 3.1 mm Multiple branches  8. Ankle 2.9 mm Tortuous, branch   mm    mm    mm      Debbra Digiulio, 05/26/2012, 4:48 PM

## 2012-05-27 ENCOUNTER — Inpatient Hospital Stay (HOSPITAL_COMMUNITY): Payer: Medicare Other

## 2012-05-27 DIAGNOSIS — L02619 Cutaneous abscess of unspecified foot: Secondary | ICD-10-CM

## 2012-05-27 DIAGNOSIS — I5033 Acute on chronic diastolic (congestive) heart failure: Secondary | ICD-10-CM

## 2012-05-27 DIAGNOSIS — L089 Local infection of the skin and subcutaneous tissue, unspecified: Secondary | ICD-10-CM

## 2012-05-27 LAB — CBC
MCV: 89.5 fL (ref 78.0–100.0)
Platelets: 311 10*3/uL (ref 150–400)
RBC: 3.25 MIL/uL — ABNORMAL LOW (ref 3.87–5.11)
WBC: 14.9 10*3/uL — ABNORMAL HIGH (ref 4.0–10.5)

## 2012-05-27 LAB — TSH: TSH: 5.714 u[IU]/mL — ABNORMAL HIGH (ref 0.350–4.500)

## 2012-05-27 LAB — LIPID PANEL
Cholesterol: 128 mg/dL (ref 0–200)
HDL: 20 mg/dL — ABNORMAL LOW (ref >39)
Total CHOL/HDL Ratio: 6.4 RATIO
Triglycerides: 79 mg/dL (ref <150)

## 2012-05-27 LAB — BASIC METABOLIC PANEL
CO2: 31 mEq/L (ref 19–32)
Chloride: 99 mEq/L (ref 96–112)
Creatinine, Ser: 1.19 mg/dL — ABNORMAL HIGH (ref 0.50–1.10)
Potassium: 3.5 mEq/L (ref 3.5–5.1)

## 2012-05-27 LAB — GLUCOSE, CAPILLARY: Glucose-Capillary: 175 mg/dL — ABNORMAL HIGH (ref 70–99)

## 2012-05-27 MED ORDER — POTASSIUM CHLORIDE CRYS ER 20 MEQ PO TBCR
40.0000 meq | EXTENDED_RELEASE_TABLET | Freq: Once | ORAL | Status: AC
  Administered 2012-05-27: 40 meq via ORAL
  Filled 2012-05-27: qty 2

## 2012-05-27 MED ORDER — LEVOTHYROXINE SODIUM 75 MCG PO TABS
75.0000 ug | ORAL_TABLET | ORAL | Status: DC
  Filled 2012-05-27: qty 1

## 2012-05-27 MED ORDER — LEVOTHYROXINE SODIUM 88 MCG PO TABS
88.0000 ug | ORAL_TABLET | Freq: Every day | ORAL | Status: DC
  Administered 2012-05-28 – 2012-06-08 (×12): 88 ug via ORAL
  Filled 2012-05-27 (×14): qty 1

## 2012-05-27 MED ORDER — VANCOMYCIN HCL IN DEXTROSE 1-5 GM/200ML-% IV SOLN
1000.0000 mg | INTRAVENOUS | Status: DC
  Administered 2012-05-27 – 2012-05-28 (×2): 1000 mg via INTRAVENOUS
  Filled 2012-05-27 (×2): qty 200

## 2012-05-27 MED ORDER — INSULIN GLARGINE 100 UNIT/ML ~~LOC~~ SOLN
11.0000 [IU] | Freq: Every day | SUBCUTANEOUS | Status: DC
  Administered 2012-05-27 – 2012-05-30 (×4): 11 [IU] via SUBCUTANEOUS

## 2012-05-27 MED ORDER — FUROSEMIDE 40 MG PO TABS
40.0000 mg | ORAL_TABLET | Freq: Every day | ORAL | Status: DC
  Administered 2012-05-27: 40 mg via ORAL
  Filled 2012-05-27 (×2): qty 1

## 2012-05-27 MED ORDER — IOHEXOL 300 MG/ML  SOLN: 80.0000 mL | Freq: Once | INTRAMUSCULAR | Status: AC | PRN

## 2012-05-27 NOTE — Progress Notes (Signed)
ANTIBIOTIC CONSULT NOTE - Follow Up  Pharmacy Consult for vancomycin and zosyn  Indication: cellulitis  Allergies  Allergen Reactions  . Amiodarone     Autonomic dysfunction  . Morphine Sulfate     REACTION: jerking  . Oxycodone Hcl Itching and Other (See Comments)    Feels weird    Patient Measurements: Height: 5\' 7"  (170.2 cm) Weight: 154 lb 15.7 oz (70.3 kg) IBW/kg (Calculated) : 61.6   Vital Signs: Temp: 99.5 F (37.5 C) (09/18 2100) Temp src: Oral (09/18 2100) BP: 134/69 mmHg (09/18 2100) Pulse Rate: 70  (09/18 2100)  Labs:  Basename 05/26/12 1130 05/26/12 0500 05/25/12 0545 05/24/12 1432  WBC -- 14.7* 14.4* 16.7*  HGB -- 9.8* 9.6* 10.7*  PLT -- 323 299 301  LABCREA -- -- -- --  CREATININE 1.19* 1.17* 1.11* --   Estimated Creatinine Clearance: 37.9 ml/min (by C-G formula based on Cr of 1.19).  Microbiology: Recent Results (from the past 720 hour(s))  URINE CULTURE     Status: Normal   Collection Time   05/15/12  3:10 PM      Component Value Range Status Comment   Specimen Description URINE, CLEAN CATCH   Final    Special Requests NONE   Final    Culture  Setup Time 05/15/2012 20:48   Final    Colony Count 5,000 COLONIES/ML   Final    Culture INSIGNIFICANT GROWTH   Final    Report Status 05/16/2012 FINAL   Final   CULTURE, BLOOD (ROUTINE X 2)     Status: Normal   Collection Time   05/15/12  4:46 PM      Component Value Range Status Comment   Specimen Description BLOOD LEFT ARM   Final    Special Requests     Final    Value: BOTTLES DRAWN AEROBIC AND ANAEROBIC 8CC EACH BOTTLE   Culture NO GROWTH 5 DAYS   Final    Report Status 05/20/2012 FINAL   Final   CULTURE, BLOOD (ROUTINE X 2)     Status: Normal   Collection Time   05/15/12  4:51 PM      Component Value Range Status Comment   Specimen Description BLOOD LEFT HAND   Final    Special Requests     Final    Value: BOTTLES DRAWN AEROBIC AND ANAEROBIC 8CC EACH BOTTLE   Culture NO GROWTH 5 DAYS   Final    Report Status 05/20/2012 FINAL   Final   MRSA PCR SCREENING     Status: Abnormal   Collection Time   05/15/12 10:20 PM      Component Value Range Status Comment   MRSA by PCR POSITIVE (*) NEGATIVE Final   CULTURE, BLOOD (ROUTINE X 2)     Status: Normal (Preliminary result)   Collection Time   05/23/12  8:00 PM      Component Value Range Status Comment   Specimen Description BLOOD LEFT HAND   Final    Special Requests BOTTLES DRAWN AEROBIC ONLY 10CC   Final    Culture  Setup Time 05/24/2012 14:17   Final    Culture     Final    Value:        BLOOD CULTURE RECEIVED NO GROWTH TO DATE CULTURE WILL BE HELD FOR 5 DAYS BEFORE ISSUING A FINAL NEGATIVE REPORT   Report Status PENDING   Incomplete   CULTURE, BLOOD (ROUTINE X 2)     Status: Normal (Preliminary result)  Collection Time   05/23/12  8:14 PM      Component Value Range Status Comment   Specimen Description BLOOD RIGHT HAND   Final    Special Requests BOTTLES DRAWN AEROBIC ONLY 10CC   Final    Culture  Setup Time 05/24/2012 14:18   Final    Culture     Final    Value:        BLOOD CULTURE RECEIVED NO GROWTH TO DATE CULTURE WILL BE HELD FOR 5 DAYS BEFORE ISSUING A FINAL NEGATIVE REPORT   Report Status PENDING   Incomplete     Medical History: Past Medical History  Diagnosis Date  . Hypertension   . Dyslipidemia   . CAD (coronary artery disease)     Non-STEMI August, 2010,,,, 40% left main, 30% LAD, 60% OM 2, 40% RCA, 90% OM1-culprit lesion-too small for intervention           . Ejection fraction     EF 55% ,2011 / EF 30% with tachycardia cardiomyopathy / EF 50% January, 2012  . Diabetes mellitus   . Dementia     ?? Early dementia ?? 2012  . GERD (gastroesophageal reflux disease)   . TIA (transient ischemic attack)     History of TIAs  . Hypothyroidism   . PAD (peripheral artery disease)     Carotid endarterectomy, right. Stents right peroneal, right superficial, right popliteal arteries, Dr. Edilia Bo  . Amputation     Right  midfoot 2010  . CKD (chronic kidney disease)     Creatinine 1.19 at December 2012 discharge  . Gait disorder   . Vertebrobasilar insufficiency   . Depression   . Hyperkalemia     ACE Inhibitor held  . Acute renal insufficiency     Catheterization August 2010  . Blindness of right eye   . Respiratory failure     Hypoxic and hypercapnic, 2011, etiology pneumonia  . Orthostatic hypotension     Syncope January 2012 with facial trauma, amiodarone stopped  . Atrial fibrillation 8/10    on pradaxa, s/p AV nodal ablation  . Ventricular tachycardia     20 beats December, 2012, asymptomatic  . Atrial flutter 05/24/09    Hospitalization in [place]; with difficult rate control; TEE cardioversion done EF 30%, secondary to tachycardia, pt did convert back tosinus rhythm; return 10/11, amiodarone restarted 10/11, no longer on it. Recurrence with RVR 08/2011.  [name] Anemia in CKD (chronic kidney disease)   . Gastroparesis 3/11    Abnormal GES  . Drug therapy,  Sotolol     Sotolol started 08/2011.  . Drug therapy     Pradaxa January, 2013  . Complete heart block     s/p AV nodal ablation  . Chronic diastolic CHF (congestive heart failure)     echo 03/2012- LVEF 55-60%, grade 2 diastolic dysfunction, mild LA dilatation, mild MR, paradoxical vent septal motion  . On home O2     prn    Medications:  Prescriptions prior to admission  Medication Sig Dispense Refill  . acetaminophen (TYLENOL) 500 MG tablet Take 500 mg by mouth every 6 (six) hours as needed. For pain      . Ascorbic Acid (VITAMIN C) 1000 MG tablet Take 1,000 mg by mouth daily.        . furosemide (LASIX) 40 MG tablet Take 1 tablet (40 mg total) by mouth 2 (two) times daily.  60 tablet  3  . insulin aspart (NOVOLOG) 100 UNIT/ML injection Inject  5-9 Units into the skin 5 (five) times daily. Per sliding scale.  90-200: 5 units, 201-250: 6 units, 251-300: 7 units, 301-350: 8 units, 351-400: 9 units      . insulin glargine (LANTUS) 100  UNIT/ML injection Inject 15 Units into the skin at bedtime.        [name] levothyroxine (SYNTHROID, LEVOTHROID) 75 MCG tablet Take 75 mcg by mouth daily.        [name] linagliptin (TRADJENTA) 5 MG TABS tablet Take 5 mg by mouth daily.      [name] lisinopril (PRINIVIL) 10 MG tablet Take 1 tablet (10 mg total) by mouth daily.  30 tablet  3  . nitroGLYCERIN (NITROSTAT) 0.4 MG SL tablet Place 0.4 mg under the tongue every 5 (five) minutes as needed. For chest pain      . sertraline (ZOLOFT) 100 MG tablet Take 100 mg by mouth daily.        [name] sulfamethoxazole-trimethoprim (BACTRIM DS) 800-160 MG per tablet Take 1 tablet by mouth 2 (two) times daily.      . travoprost, benzalkonium, (TRAVATAN) 0.004 % ophthalmic solution Place 1 drop into both eyes at bedtime.          Assessment: 78yo female was discharged from [facility] 9/12 had been on vanc for cellulitis and improved, sent home on PO ABX, now admitted to [facility] and to resume vancomycin and start zosyn for cellulitis / possible osteomyelitis; of note pt has h/o MRSA infection. Vancomycin trough (12.4 mcg/mL) is below-goal.   Goal of Therapy:  Vancomycin trough 15-20 mcg/mL  Plan:  1. Increase vancomycin to 1gm IV Q24H.  2. Continue Zosyn 3.375gm IV Q8H (4 hr infusion)  Lorre Munroe, PharmD 05/27/12, 00:30 AM

## 2012-05-27 NOTE — Progress Notes (Addendum)
Asked to see pt today at my partner Dr Estanislado Spire request since he is out of town.  Pt complains that her left foot hurts.  She has been unable to walk on this since admission.  Her family also feels the foot is worse.  PE:  Filed Vitals:   05/26/12 1812 05/26/12 2100 05/27/12 0157 05/27/12 0541  BP: 152/57 134/69 128/51 128/53  Pulse: 69 70 72 72  Temp: 98.8 F (37.1 C) 99.5 F (37.5 C) 98.5 F (36.9 C) 98.6 F (37 C)  TempSrc: Oral Oral Oral Oral  Resp: 18 16 16 14   Height:      Weight:      SpO2: 100% 97% 94% 96%   Left lower extremity erythema surrounding left fifth metatarsal head with ulcer the erythema surrounds most of the left forefoot on the lateral aspect there is edema and possible fluctuance, there is some clear drainage but no expressible pus, 2+ left femoral pulse with absent popliteal and pedal pulses  CBC    Component Value Date/Time   WBC 14.9* 05/27/2012 0500   RBC 3.25* 05/27/2012 0500   HGB 9.4* 05/27/2012 0500   HCT 29.1* 05/27/2012 0500   PLT 311 05/27/2012 0500   MCV 89.5 05/27/2012 0500   MCH 28.9 05/27/2012 0500   MCHC 32.3 05/27/2012 0500   RDW 14.6 05/27/2012 0500   LYMPHSABS 3.6 05/22/2012 0045   MONOABS 1.2* 05/22/2012 0045   EOSABS 0.3 05/22/2012 0045   BASOSABS 0.0 05/22/2012 0045    Current Facility-Administered Medications  Medication Dose Route Frequency Provider Last Rate Last Dose  . acetaminophen (TYLENOL) tablet 650 mg  650 mg Oral Q4H PRN Fransisco Hertz, MD      . alum & mag hydroxide-simeth (MAALOX/MYLANTA) 200-200-20 MG/5ML suspension 30 mL  30 mL Oral Q6H PRN Ron Parker, MD      . furosemide (LASIX) injection 40 mg  40 mg Intravenous Once Rollene Rotunda, MD   40 mg at 05/26/12 1141  . HYDROmorphone (DILAUDID) injection 0.5-1 mg  0.5-1 mg Intravenous Q3H PRN Ron Parker, MD   0.5 mg at 05/26/12 1958  . insulin aspart (novoLOG) injection 0-5 Units  0-5 Units Subcutaneous QHS Ron Parker, MD   3 Units at 05/25/12 2236  .  insulin aspart (novoLOG) injection 0-9 Units  0-9 Units Subcutaneous TID WC Ron Parker, MD   1 Units at 05/26/12 1742  . insulin glargine (LANTUS) injection 21 Units  21 Units Subcutaneous QHS Catarina Hartshorn, MD   21 Units at 05/26/12 2215  . levothyroxine (SYNTHROID, LEVOTHROID) tablet 75 mcg  75 mcg Oral Daily Sorin Luanne Bras, MD   75 mcg at 05/26/12 [phone]  . ondansetron (ZOFRAN) tablet 4 mg  4 mg Oral Q6H PRN Ron Parker, MD       Or  . ondansetron (ZOFRAN) injection 4 mg  4 mg Intravenous Q6H PRN Ron Parker, MD      . ondansetron [facility]) injection 4 mg  4 mg Intravenous Q6H PRN Fransisco Hertz, MD      . oxyCODONE (Oxy IR/ROXICODONE) immediate release tablet 5 mg  5 mg Oral Q4H PRN Ron Parker, MD   5 mg at 05/27/12 0522  . oxyCODONE (OXYCONTIN) 12 hr tablet 10 mg  10 mg Oral Q12H Catarina Hartshorn, MD   10 mg at 05/26/12 2214  . piperacillin-tazobactam (ZOSYN) IVPB 3.375 g  3.375 g Intravenous Q8H Gildardo Cranker Grimsley, PHARMD   3.375 g at  05/27/12 5784  . sertraline (ZOLOFT) tablet 100 mg  100 mg Oral Daily Sorin Luanne Bras, MD   100 mg at 05/26/12 0952  . sodium chloride 0.9 % injection 10-40 mL  10-40 mL Intracatheter PRN Sorin Luanne Bras, MD   10 mL at 05/26/12 0452  . Travoprost (BAK Free) (TRAVATAN) 0.004 % ophthalmic solution SOLN 1 drop  1 drop Both Eyes QHS Debby Crosley, MD   1 drop at 05/26/12 2200  . vancomycin (VANCOCIN) 750 mg in sodium chloride 0.9 % 150 mL IVPB  750 mg Intravenous Q24H Catarina Hartshorn, MD   750 mg at 05/27/12 0118  . vancomycin (VANCOCIN) IVPB 1000 mg/200 mL premix  1,000 mg Intravenous Q24H Catarina Hartshorn, MD      . vitamin C (ASCORBIC ACID) tablet 1,000 mg  1,000 mg Oral Daily Sorin Luanne Bras, MD   1,000 mg at 05/26/12 6962  . zolpidem (AMBIEN) tablet 5 mg  5 mg Oral QHS PRN Ron Parker, MD      . DISCONTD: insulin glargine (LANTUS) injection 15 Units  15 Units Subcutaneous QHS Joseph Art, DO   15 Units at 05/25/12 2235    Assessment: Left foot cellulitis  with possible deep space abscess. However this is my first time seeing her foot today. She is currently scheduled for a bypass early next week. However, we need to make sure that she does not have any abscess ongoing.  Plan: CT scan of foot today if there is an abscess present she will need I&D of this tomorrow. This was discussed with the patient and the family today.  Fabienne Bruns, MD [facility] Office: [phone] Pager: [phone]   CT scan reviewed.  She has an abscess in the left foot.  Will drain in OR tomorrow.  This may require extensive debridement or even amputation of toes but the sepsis needs to be controlled prior to bypass.  NPO p midnight Consent Procedure discussed with family by phone.  Fabienne Bruns, MD [facility] [place] Office: [phone] Pager: [phone]

## 2012-05-27 NOTE — Progress Notes (Signed)
Patient ID: Brittney [name], female   DOB: 1932/04/07, 78 y.o.   MRN: [id number]   SUBJECTIVE: The patient has been given IV diuretics over the past several days. She has diuresed. She is on diuretic at home. Today I have started her on Lasix 40 orally daily. Her renal function is stable. Her potassium is on the low side today and I will supplement her potassium today.She is lying flat and comfortable in bed without shortness of breath. There is oxygen in place.   Filed Vitals:   05/26/12 1812 05/26/12 2100 05/27/12 0157 05/27/12 0541  BP: 152/57 134/69 128/51 128/53  Pulse: 69 70 72 72  Temp: 98.8 F (37.1 C) 99.5 F (37.5 C) 98.5 F (36.9 C) 98.6 F (37 C)  TempSrc: Oral Oral Oral Oral  Resp: 18 16 16 14   Height:      Weight:      SpO2: 100% 97% 94% 96%    Intake/Output Summary (Last 24 hours) at 05/27/12 0946 Last data filed at 05/27/12 0834  Gross per 24 hour  Intake    630 ml  Output   3350 ml  Net  -2720 ml    LABS: Basic Metabolic Panel:  Basename 05/27/12 0500 05/26/12 1130  NA 135 134*  K 3.5 4.1  CL 99 100  CO2 31 27  GLUCOSE 125* 297*  BUN 18 17  CREATININE 1.19* 1.19*  CALCIUM 8.8 8.8  MG -- --  PHOS -- --   Liver Function Tests: No results found for this basename: AST:2,ALT:2,ALKPHOS:2,BILITOT:2,PROT:2,ALBUMIN:2 in the last 72 hours No results found for this basename: LIPASE:2,AMYLASE:2 in the last 72 hours CBC:  Basename 05/27/12 0500 05/26/12 0500  WBC 14.9* 14.7*  NEUTROABS -- --  HGB 9.4* 9.8*  HCT 29.1* 29.9*  MCV 89.5 90.3  PLT 311 323   Cardiac Enzymes: No results found for this basename: CKTOTAL:3,CKMB:3,CKMBINDEX:3,TROPONINI:3 in the last 72 hours BNP: No components found with this basename: POCBNP:3 D-Dimer: No results found for this basename: DDIMER:2 in the last 72 hours Hemoglobin A1C: No results found for this basename: HGBA1C in the last 72 hours Fasting Lipid Panel:  Basename 05/27/12 0500  CHOL 128  HDL 20*  LDLCALC 92    TRIG 79  CHOLHDL 6.4  LDLDIRECT --   Thyroid Function Tests: No results found for this basename: TSH,T4TOTAL,FREET3,T3FREE,THYROIDAB in the last 72 hours  RADIOLOGY: Dg Chest 2 View  05/15/2012  *RADIOLOGY REPORT*  Clinical Data: Fever.  CHEST - 2 VIEW  Comparison: 01/24/2012.  Findings: The pacer wires are stable.  The cardiac silhouette, mediastinal and hilar contours are mildly prominent but unchanged. There is persistent central vascular congestion and possible mild interstitial edema.  No pleural effusion or focal infiltrate.  The bony thorax is intact.  IMPRESSION: Stable cardiac enlargement and central vascular congestion with possible mild interstitial edema.   Original Report Authenticated By: P. Loralie Champagne, M.D.    Dg Chest Port 1 View  05/25/2012  *RADIOLOGY REPORT*  Clinical Data: PICC line placement.  PORTABLE CHEST - 1 VIEW  Comparison: 05/21/2012  Findings: Right-sided PICC line noted with tip projecting over the SVC.  No pneumothorax or complicating feature.  Dual lead pacer noted.  There is mild cardiomegaly along with stable interstitial accentuation.  Peripheral airspace opacity in the right mid lung appears slightly less striking.  Atherosclerotic calcification in the aortic arch noted.  IMPRESSION:  1.  Right PICC line tip:  SVC.  No pneumothorax or complicating feature. 2.  Cardiomegaly with mild interstitial accentuation, suspicious for mild interstitial edema. 3.  Faint airspace disease peripherally in the right mid lung appears improved.   Original Report Authenticated By: [name], M.D.    Dg Foot Complete Left  05/15/2012  *RADIOLOGY REPORT*  Clinical Data: Diabetic foot ulcers.  LEFT FOOT - COMPLETE 3+ VIEW  Comparison: None  Findings: The joint spaces are maintained.  No acute bony findings or destructive bony changes.  IMPRESSION: No plain film findings for osteomyelitis.   Original Report Authenticated By: P. Loralie Champagne, M.D.     PHYSICAL EXAM   Patient's family is in the room. The patient is sleepy but alert. Lungs reveal a few scattered rhonchi. Cardiac exam reveals S1 and S2. There no clicks or significant murmurs.   ASSESSMENT AND PLAN:   CAD (coronary artery disease)   Coronary disease is stable.   CHF (congestive heart failure)   Her diastolic CHF is now under control. Plan to continue oral Lasix watching her volume status and her renal status and potassium. As long as her renal function remained stable, I suspect that continued very slight diuresis will be good for her.   Hypokalemia  She'll be given one dose of potassium today. Decisions about further dosing based on her diuretics and her labs tomorrow.  The patient has been cleared for vascular surgery when the surgeons feel they can proceed.  Willa Rough 05/27/2012 9:46 AM

## 2012-05-27 NOTE — Progress Notes (Signed)
Clinical Social Work  Patient was discussed at [employer] and at progression. MD will order PT/OT after procedures to determine what level of care is needed. CSW will continue to follow.  [place], [place] [phone]

## 2012-05-27 NOTE — Progress Notes (Signed)
[facility] PROGRESS NOTE  Brittney [name] RUE:[id number] DOB: 1932/04/05 DOA: 05/21/2012 PCP: [name], MD  Assessment/Plan:  Diabetic foot infection/abscess -MRI not possible due to the patient's pacemaker  -Still suspect underlying osteomyelitis  -nuclear medicine WBC tag scan  -CT of the foot reveals abscess -Appreciate Dr. Darrick Penna, surgical plans noted for tomorrow -Continue vancomycin and Zosyn  -Please obtain cultures during surgery Severe peripheral vascular disease  - revascularization after infection controlled Diastolic CHF  -Continue strict I/Os, furosemide  --2355 cc yesterday CKD stage III  -Creatinine for the most part stable  Diabetes mellitus type 2  -Continue Lantus to 21 units  -Pre-meal insulin if patient increase his by mouth intake  -Patient continues to have poor by mouth intake -Check prealbumin -Decrease Lantus dose due to surgery tomorrow and n.p.o. status Hypothyroidism  -Continue Synthroid  -Increase Synthroid to 88 mcg daily Atrial fib  -does not appear to be good warfarin candidate due to falls, poor insight  -ASA after surgeries    Procedures/Studies: Dg Chest 2 View  05/15/2012  *RADIOLOGY REPORT*  Clinical Data: Fever.  CHEST - 2 VIEW  Comparison: 01/24/2012.  Findings: The pacer wires are stable.  The cardiac silhouette, mediastinal and hilar contours are mildly prominent but unchanged. There is persistent central vascular congestion and possible mild interstitial edema.  No pleural effusion or focal infiltrate.  The bony thorax is intact.  IMPRESSION: Stable cardiac enlargement and central vascular congestion with possible mild interstitial edema.   Original Report Authenticated By: P. Loralie Champagne, M.D.    Ct Tibia Fibula Left W Contrast  05/27/2012  *RADIOLOGY REPORT*  Clinical Data:  Diabetic with foot pain, erythema and swelling. Possible abscess.  CT OF THE LEFT LOWER LEG WITH CONTRAST  Technique:  Multidetector CT imaging of  the left lower leg was performed according to the standard protocol following intravenous contrast administration. Multiplanar CT image reconstructions were also generated.  Contrast:  80 ml Omnipaque-300  Comparison:  Foot radiographs 05/15/2012 and today.  Findings:  Imaging was performed from the proximal left lower leg through the left foot as specifically requested.  There is an ill-defined peripherally enhancing fluid collection within the fourth webspace of the foot.  This contains a small air bubble and measures up to 2.1 cm in greatest dimension. Fluid extends distally into the fourth toe.  There is some additional soft tissue emphysema tracking medially within the plantar forefoot within the plantar aspects of the second, third and fourth toes. No other measurable fluid collections are seen.  There is subcutaneous edema throughout the foot to extending into the distal lower leg.  Superficial varicosities are noted within the lower leg.  Bone windows demonstrate no cortical destruction or significant arthropathic changes.  IMPRESSION:  1.  There is an ill-defined fluid collection within the fourth webspace with distal extension into the fourth toe and along the plantar aspects of the second and third toes.  This collection demonstrates peripheral enhancement and air bubbles and is consistent with an abscess. 2.  No CT evidence of osteomyelitis. 3.  Nonspecific subcutaneous edema in the distal lower leg and plantar foot.   Original Report Authenticated By: [name], M.D.    Dg Chest Port 1 View  05/25/2012  *RADIOLOGY REPORT*  Clinical Data: PICC line placement.  PORTABLE CHEST - 1 VIEW  Comparison: 05/21/2012  Findings: Right-sided PICC line noted with tip projecting over the SVC.  No pneumothorax or complicating feature.  Dual lead pacer noted.  There is  mild cardiomegaly along with stable interstitial accentuation.  Peripheral airspace opacity in the right mid lung appears slightly less  striking.  Atherosclerotic calcification in the aortic arch noted.  IMPRESSION:  1.  Right PICC line tip:  SVC.  No pneumothorax or complicating feature. 2.  Cardiomegaly with mild interstitial accentuation, suspicious for mild interstitial edema. 3.  Faint airspace disease peripherally in the right mid lung appears improved.   Original Report Authenticated By: [name], M.D.    Dg Foot 2 Views Left  05/27/2012  *RADIOLOGY REPORT*  Clinical Data: Diabetic foot infection  LEFT FOOT - 2 VIEW  Comparison: 05/15/2012  Findings: Scattered soft tissue swelling. Diffuse osseous demineralization. Foci of soft tissue gas identified between the bases of the fourth and fifth toes. Joint spaces preserved. Hallux valgus. No acute fracture, dislocation or bone destruction.  IMPRESSION: Soft tissue gas between the bases of the fourth and fifth toes consistent with soft tissue infection. No definite evidence of osteomyelitis. If further soft tissue assessment needed or persistent clinical concern for occult osteomyelitis remains, consider MR imaging of the left foot.   Original Report Authenticated By: [name], M.D.    Dg Foot Complete Left  05/15/2012  *RADIOLOGY REPORT*  Clinical Data: Diabetic foot ulcers.  LEFT FOOT - COMPLETE 3+ VIEW  Comparison: None  Findings: The joint spaces are maintained.  No acute bony findings or destructive bony changes.  IMPRESSION: No plain film findings for osteomyelitis.   Original Report Authenticated By: P. Loralie Champagne, M.D.      Antibiotics:  Vancomycin September 14>>>  Zosyn September 14>>>   Code Status: Full Family Communication: daughter Disposition Plan: Home when medically stable  Subjective: Patient is breathing better. Denies any fevers, chills, chest pain, shortness of breath, nausea, vomiting, diarrhea, abdominal pain. Still complains of some pain in the left foot but it is better controlled.   Objective: Filed Vitals:   05/27/12 0157  05/27/12 0541 05/27/12 1037 05/27/12 1453  BP: 128/51 128/53 147/59 111/47  Pulse: 72 72 70 70  Temp: 98.5 F (36.9 C) 98.6 F (37 C) 98.3 F (36.8 C) 99.1 F (37.3 C)  TempSrc: Oral Oral Oral Oral  Resp: 16 14 16 20   Height:      Weight:      SpO2: 94% 96% 100% 92%    Intake/Output Summary (Last 24 hours) at 05/27/12 1722 Last data filed at 05/27/12 1513  Gross per 24 hour  Intake   1010 ml  Output   2025 ml  Net  -1015 ml   Weight change:  Exam:   General:  Pt is alert, follows commands appropriately, not in acute distress  HEENT: No icterus, No thrush, [place]/AT  Cardiovascular: Regular rate and rhythm, S1/S2, no rubs, no gallops  Respiratory: Clear to auscultation bilaterally, no wheezing, no crackles, no rhonchi  Abdomen: Soft, non tender, non distended, bowel sounds present, no guarding  Extremities:  No lymphangitis, No petechiae, No rashes, no synovitis; left foot with mild erythema on the plantar surface of the base of the fourth and fifth metatarsal area--no crepitance, no necrosis  Data Reviewed: Basic Metabolic Panel:  Lab 05/27/12 6644 05/26/12 1130 05/26/12 0500 05/25/12 0545 05/24/12 1432 05/22/12 0645  NA 135 134* 134* 136 -- 136  K 3.5 4.1 4.2 4.0 -- 3.5  CL 99 100 101 104 -- 101  CO2 31 27 27 24  -- 23  GLUCOSE 125* 297* 265* 172* -- 189*  BUN 18 17 19 20  --  32*  CREATININE 1.19* 1.19* 1.17* 1.11* 1.00 --  CALCIUM 8.8 8.8 9.0 9.2 -- 9.3  MG -- -- -- -- -- --  PHOS -- -- -- -- -- --   Liver Function Tests: No results found for this basename: AST:5,ALT:5,ALKPHOS:5,BILITOT:5,PROT:5,ALBUMIN:5 in the last 168 hours No results found for this basename: LIPASE:5,AMYLASE:5 in the last 168 hours No results found for this basename: AMMONIA:5 in the last 168 hours CBC:  Lab 05/27/12 0500 05/26/12 0500 05/25/12 0545 05/24/12 1432 05/22/12 0645 05/22/12 0045  WBC 14.9* 14.7* 14.4* 16.7* 13.4* --  NEUTROABS -- -- -- -- -- 9.2*  HGB 9.4* 9.8* 9.6* 10.7*  10.9* --  HCT 29.1* 29.9* 29.9* 33.1* 33.5* --  MCV 89.5 90.3 89.0 89.2 88.2 --  PLT 311 323 299 301 268 --   Cardiac Enzymes:  Lab 05/24/12 0807 05/24/12 0037 05/23/12 2000  CKTOTAL -- -- --  CKMB -- -- --  CKMBINDEX -- -- --  TROPONINI <0.30 <0.30 <0.30   BNP: No components found with this basename: POCBNP:5 CBG:  Lab 05/27/12 1712 05/27/12 1122 05/27/12 0812 05/26/12 2158 05/26/12 1715  GLUCAP 164* 175* 106* 199* 132*    Recent Results (from the past 240 hour(s))  CULTURE, BLOOD (ROUTINE X 2)     Status: Normal (Preliminary result)   Collection Time   05/23/12  8:00 PM      Component Value Range Status Comment   Specimen Description BLOOD LEFT HAND   Final    Special Requests BOTTLES DRAWN AEROBIC ONLY 10CC   Final    Culture  Setup Time 05/24/2012 14:17   Final    Culture     Final    Value:        BLOOD CULTURE RECEIVED NO GROWTH TO DATE CULTURE WILL BE HELD FOR 5 DAYS BEFORE ISSUING A FINAL NEGATIVE REPORT   Report Status PENDING   Incomplete   CULTURE, BLOOD (ROUTINE X 2)     Status: Normal (Preliminary result)   Collection Time   05/23/12  8:14 PM      Component Value Range Status Comment   Specimen Description BLOOD RIGHT HAND   Final    Special Requests BOTTLES DRAWN AEROBIC ONLY 10CC   Final    Culture  Setup Time 05/24/2012 14:18   Final    Culture     Final    Value:        BLOOD CULTURE RECEIVED NO GROWTH TO DATE CULTURE WILL BE HELD FOR 5 DAYS BEFORE ISSUING A FINAL NEGATIVE REPORT   Report Status PENDING   Incomplete      Scheduled Meds:   . furosemide  40 mg Oral Daily  . insulin aspart  0-5 Units Subcutaneous QHS  . insulin aspart  0-9 Units Subcutaneous TID WC  . insulin glargine  21 Units Subcutaneous QHS  . levothyroxine  88 mcg Oral QAC breakfast  . oxyCODONE  10 mg Oral Q12H  . piperacillin-tazobactam (ZOSYN)  IV  3.375 g Intravenous Q8H  . potassium chloride  40 mEq Oral Once  . sertraline  100 mg Oral Daily  . Travoprost (BAK Free)  1 drop  Both Eyes QHS  . vancomycin  750 mg Intravenous Q24H  . vancomycin  1,000 mg Intravenous Q24H  . vitamin C  1,000 mg Oral Daily  . DISCONTD: insulin glargine  15 Units Subcutaneous QHS  . DISCONTD: levothyroxine  75 mcg Oral Daily  . DISCONTD: levothyroxine  75 mcg Oral Q24H  Continuous Infusions:    Claudett Bayly, DO  [facility] Pager [phone]  If 7PM-7AM, please contact night-coverage [email or web address] Password [email or web address] 05/27/2012, 5:22 PM   LOS: 6 days

## 2012-05-28 ENCOUNTER — Inpatient Hospital Stay (HOSPITAL_COMMUNITY): Payer: Medicare Other | Admitting: Anesthesiology

## 2012-05-28 ENCOUNTER — Encounter (HOSPITAL_COMMUNITY)
Admission: EM | Disposition: A | Discharge status: Skilled Nursing Facility | Payer: Self-pay | Source: Other Acute Inpatient Hospital | Attending: Internal Medicine

## 2012-05-28 ENCOUNTER — Encounter (HOSPITAL_COMMUNITY): Payer: Self-pay | Admitting: Anesthesiology

## 2012-05-28 ENCOUNTER — Inpatient Hospital Stay (HOSPITAL_COMMUNITY): Payer: Medicare Other

## 2012-05-28 HISTORY — PX: I&D EXTREMITY: SHX5045

## 2012-05-28 LAB — BASIC METABOLIC PANEL
Chloride: 101 mEq/L (ref 96–112)
Creatinine, Ser: 1.63 mg/dL — ABNORMAL HIGH (ref 0.50–1.10)
GFR calc Af Amer: 34 mL/min — ABNORMAL LOW (ref >90)

## 2012-05-28 LAB — CBC
HCT: 30.3 % — ABNORMAL LOW (ref 36.0–46.0)
Platelets: 330 10*3/uL (ref 150–400)
RDW: 14.8 % (ref 11.5–15.5)
WBC: 16.6 10*3/uL — ABNORMAL HIGH (ref 4.0–10.5)

## 2012-05-28 LAB — GLUCOSE, CAPILLARY: Glucose-Capillary: 182 mg/dL — ABNORMAL HIGH (ref 70–99)

## 2012-05-28 LAB — PREALBUMIN: Prealbumin: 4 mg/dL — ABNORMAL LOW (ref 17.0–34.0)

## 2012-05-28 SURGERY — IRRIGATION AND DEBRIDEMENT EXTREMITY
Anesthesia: General | Site: Foot | Laterality: Left | Wound class: Dirty or Infected

## 2012-05-28 MED ORDER — FENTANYL CITRATE 0.05 MG/ML IJ SOLN
INTRAMUSCULAR | Status: DC | PRN
  Administered 2012-05-28: 50 ug via INTRAVENOUS

## 2012-05-28 MED ORDER — FUROSEMIDE 20 MG PO TABS
20.0000 mg | ORAL_TABLET | Freq: Every day | ORAL | Status: DC
  Administered 2012-05-28 – 2012-05-29 (×2): 20 mg via ORAL
  Filled 2012-05-28 (×2): qty 1

## 2012-05-28 MED ORDER — FENTANYL CITRATE 0.05 MG/ML IJ SOLN: 25.0000 ug | INTRAMUSCULAR | Status: DC | PRN

## 2012-05-28 MED ORDER — SODIUM CHLORIDE 0.9 % IV SOLN
INTRAVENOUS | Status: DC | PRN
  Administered 2012-05-28: 12:00:00 via INTRAVENOUS

## 2012-05-28 MED ORDER — LACTATED RINGERS IV SOLN
INTRAVENOUS | Status: DC | PRN
  Administered 2012-05-28: 13:00:00 via INTRAVENOUS

## 2012-05-28 MED ORDER — ACETAMINOPHEN 10 MG/ML IV SOLN
INTRAVENOUS | Status: DC | PRN
  Administered 2012-05-28: 1000 mg via INTRAVENOUS

## 2012-05-28 MED ORDER — PROPOFOL 10 MG/ML IV EMUL
INTRAVENOUS | Status: DC | PRN
  Administered 2012-05-28: 130 mL via INTRAVENOUS

## 2012-05-28 MED ORDER — LIDOCAINE HCL (CARDIAC) 20 MG/ML IV SOLN
INTRAVENOUS | Status: DC | PRN
  Administered 2012-05-28: 60 mg via INTRAVENOUS

## 2012-05-28 MED ORDER — ONDANSETRON HCL 4 MG/2ML IJ SOLN
INTRAMUSCULAR | Status: DC | PRN
  Administered 2012-05-28: 4 mg via INTRAVENOUS

## 2012-05-28 MED ORDER — DEXTROSE 5 % IV SOLN
INTRAVENOUS | Status: DC | PRN
  Administered 2012-05-28: 13:00:00 via INTRAVENOUS

## 2012-05-28 MED ORDER — OXYCODONE HCL 10 MG PO TB12
10.0000 mg | ORAL_TABLET | Freq: Two times a day (BID) | ORAL | Status: DC
  Administered 2012-05-29 – 2012-06-08 (×20): 10 mg via ORAL
  Filled 2012-05-28 (×20): qty 1

## 2012-05-28 SURGICAL SUPPLY — 27 items
BANDAGE ELASTIC 4 VELCRO ST LF (GAUZE/BANDAGES/DRESSINGS) ×2 IMPLANT
BANDAGE ELASTIC 6 VELCRO ST LF (GAUZE/BANDAGES/DRESSINGS) IMPLANT
BANDAGE GAUZE ELAST BULKY 4 IN (GAUZE/BANDAGES/DRESSINGS) ×2 IMPLANT
CANISTER SUCTION 2500CC (MISCELLANEOUS) ×2 IMPLANT
CLOTH BEACON ORANGE TIMEOUT ST (SAFETY) ×2 IMPLANT
COVER SURGICAL LIGHT HANDLE (MISCELLANEOUS) ×2 IMPLANT
ELECT REM PT RETURN 9FT ADLT (ELECTROSURGICAL) ×2
ELECTRODE REM PT RTRN 9FT ADLT (ELECTROSURGICAL) ×1 IMPLANT
GLOVE BIO SURGEON STRL SZ7.5 (GLOVE) ×2 IMPLANT
GOWN PREVENTION PLUS XLARGE (GOWN DISPOSABLE) ×2 IMPLANT
GOWN STRL NON-REIN LRG LVL3 (GOWN DISPOSABLE) ×4 IMPLANT
KIT BASIN OR (CUSTOM PROCEDURE TRAY) ×2 IMPLANT
KIT ROOM TURNOVER OR (KITS) ×2 IMPLANT
NS IRRIG 1000ML POUR BTL (IV SOLUTION) ×2 IMPLANT
PACK GENERAL/GYN (CUSTOM PROCEDURE TRAY) IMPLANT
PACK UNIVERSAL I (CUSTOM PROCEDURE TRAY) IMPLANT
PAD ARMBOARD 7.5X6 YLW CONV (MISCELLANEOUS) ×4 IMPLANT
SPONGE GAUZE 4X4 12PLY (GAUZE/BANDAGES/DRESSINGS) ×2 IMPLANT
STAPLER VISISTAT 35W (STAPLE) IMPLANT
SUT ETHILON 3 0 PS 1 (SUTURE) IMPLANT
SUT VIC AB 2-0 CTX 36 (SUTURE) IMPLANT
SUT VIC AB 3-0 SH 27 (SUTURE)
SUT VIC AB 3-0 SH 27X BRD (SUTURE) IMPLANT
SUT VICRYL 4-0 PS2 18IN ABS (SUTURE) IMPLANT
TOWEL OR 17X24 6PK STRL BLUE (TOWEL DISPOSABLE) ×2 IMPLANT
TOWEL OR 17X26 10 PK STRL BLUE (TOWEL DISPOSABLE) ×2 IMPLANT
WATER STERILE IRR 1000ML POUR (IV SOLUTION) ×2 IMPLANT

## 2012-05-28 NOTE — Progress Notes (Signed)
Clinical Social Work  CSW attempted to meet with patient but patient out of room for procedure. Patient was against SNF placement but now that additional procedures have been completed, CSW was going to readdress SNF with her. No family present in room. MD reported he will order PT/OT after all procedures completed. CSW has saved [facility] and documents in system to prepare for SNF search. If patient agreeable, CSW will fax out.  [place], [place] [phone]

## 2012-05-28 NOTE — Progress Notes (Signed)
SUBJECTIVE:  Mild SOB today.     PHYSICAL EXAM Filed Vitals:   05/27/12 2050 05/27/12 2221 05/28/12 0105 05/28/12 0640  BP: 129/68 105/50 119/55 109/54  Pulse: 70 70 70 70  Temp: 99.5 F (37.5 C) 98.5 F (36.9 C) 99.2 F (37.3 C) 98.1 F (36.7 C)  TempSrc: Oral Oral Oral Oral  Resp: 16 18 20 20   Height:      Weight:      SpO2: 97% 91% 98% 93%   General:  No distress Lungs:  Few basilar crackles Heart:  RRR Abdomen:  Positive bowel sounds, no rebound no guarding Extremities:  Mild edema.    LABS:  Results for orders placed during the hospital encounter of 05/21/12 (from the past 24 hour(s))  GLUCOSE, CAPILLARY     Status: Abnormal   Collection Time   05/27/12 11:22 AM      Component Value Range   Glucose-Capillary 175 (*) 70 - 99 mg/dL   Comment 1 Notify RN    GLUCOSE, CAPILLARY     Status: Abnormal   Collection Time   05/27/12  5:12 PM      Component Value Range   Glucose-Capillary 164 (*) 70 - 99 mg/dL   Comment 1 Notify RN    GLUCOSE, CAPILLARY     Status: Abnormal   Collection Time   05/27/12 10:19 PM      Component Value Range   Glucose-Capillary 216 (*) 70 - 99 mg/dL  BASIC METABOLIC PANEL     Status: Abnormal   Collection Time   05/28/12  5:00 AM      Component Value Range   Sodium 138  135 - 145 mEq/L   Potassium 4.4  3.5 - 5.1 mEq/L   Chloride 101  96 - 112 mEq/L   CO2 31  19 - 32 mEq/L   Glucose, Bld 191 (*) 70 - 99 mg/dL   BUN 20  6 - 23 mg/dL   Creatinine, Ser 1.61 (*) 0.50 - 1.10 mg/dL   Calcium 8.9  8.4 - 09.6 mg/dL   GFR calc non Af Amer 29 (*) >90 mL/min   GFR calc Af Amer 34 (*) >90 mL/min  CBC     Status: Abnormal   Collection Time   05/28/12  5:00 AM      Component Value Range   WBC 16.6 (*) 4.0 - 10.5 K/uL   RBC 3.34 (*) 3.87 - 5.11 MIL/uL   Hemoglobin 9.5 (*) 12.0 - 15.0 g/dL   HCT 04.5 (*) 40.9 - 81.1 %   MCV 90.7  78.0 - 100.0 fL   MCH 28.4  26.0 - 34.0 pg   MCHC 31.4  30.0 - 36.0 g/dL   RDW 91.4  78.2 - 95.6 %   Platelets  330  150 - 400 K/uL    Intake/Output Summary (Last 24 hours) at 05/28/12 0814 Last data filed at 05/28/12 0640  Gross per 24 hour  Intake    790 ml  Output   1550 ml  Net   -760 ml    ASSESSMENT AND PLAN:  PVD To the OR today for debridement plus or minus more.  She has been cleared for surgery by Dr. Myrtis Ser.  CAD (coronary artery disease) Mild Plaque in 2010. No active angina.  No further work up.  CHF (congestive heart failure) On PO diuretic and volume OK.  Creat is up slightly.  Follow in the AM.  I will reduce the Lasix today.  We  will follow as needed.    Atrial fibrillation  Currently not on anticoagulation and Dr. Myrtis Ser will discuss this with her further in the future.   Fayrene Fearing [facility] 05/28/2012 8:14 AM

## 2012-05-28 NOTE — Anesthesia Postprocedure Evaluation (Signed)
  Anesthesia Post-op Note  Patient: Brittney [name]  Procedure(s) Performed: Procedure(s) (LRB) with comments: IRRIGATION AND DEBRIDEMENT EXTREMITY (Left) - possible amputation toes  Patient Location: PACU  Anesthesia Type: General  Level of Consciousness: awake  Airway and Oxygen Therapy: Patient Spontanous Breathing  Post-op Pain: mild  Post-op Assessment: Post-op Vital signs reviewed  Post-op Vital Signs: Reviewed  Complications: No apparent anesthesia complications

## 2012-05-28 NOTE — H&P (View-Only) (Signed)
Asked to see pt today at my partner Dr Brabham's request since he is out of town.  Pt complains that her left foot hurts.  She has been unable to walk on this since admission.  Her family also feels the foot is worse.  PE:  Filed Vitals:   05/26/12 1812 05/26/12 2100 05/27/12 0157 05/27/12 0541  BP: 152/57 134/69 128/51 128/53  Pulse: 69 70 72 72  Temp: 98.8 F (37.1 C) 99.5 F (37.5 C) 98.5 F (36.9 C) 98.6 F (37 C)  TempSrc: Oral Oral Oral Oral  Resp: 18 16 16 14  Height:      Weight:      SpO2: 100% 97% 94% 96%   Left lower extremity erythema surrounding left fifth metatarsal head with ulcer the erythema surrounds most of the left forefoot on the lateral aspect there is edema and possible fluctuance, there is some clear drainage but no expressible pus, 2+ left femoral pulse with absent popliteal and pedal pulses  CBC    Component Value Date/Time   WBC 14.9* 05/27/2012 0500   RBC 3.25* 05/27/2012 0500   HGB 9.4* 05/27/2012 0500   HCT 29.1* 05/27/2012 0500   PLT 311 05/27/2012 0500   MCV 89.5 05/27/2012 0500   MCH 28.9 05/27/2012 0500   MCHC 32.3 05/27/2012 0500   RDW 14.6 05/27/2012 0500   LYMPHSABS 3.6 05/22/2012 0045   MONOABS 1.2* 05/22/2012 0045   EOSABS 0.3 05/22/2012 0045   BASOSABS 0.0 05/22/2012 0045    Current Facility-Administered Medications  Medication Dose Route Frequency Provider Last Rate Last Dose  . acetaminophen (TYLENOL) tablet 650 mg  650 mg Oral Q4H PRN Brian L Chen, MD      . alum & mag hydroxide-simeth (MAALOX/MYLANTA) 200-200-20 MG/5ML suspension 30 mL  30 mL Oral Q6H PRN Harvette C Jenkins, MD      . furosemide (LASIX) injection 40 mg  40 mg Intravenous Once James Hochrein, MD   40 mg at 05/26/12 1141  . HYDROmorphone (DILAUDID) injection 0.5-1 mg  0.5-1 mg Intravenous Q3H PRN Harvette C Jenkins, MD   0.5 mg at 05/26/12 1958  . insulin aspart (novoLOG) injection 0-5 Units  0-5 Units Subcutaneous QHS Harvette C Jenkins, MD   3 Units at 05/25/12 2236  .  insulin aspart (novoLOG) injection 0-9 Units  0-9 Units Subcutaneous TID WC Harvette C Jenkins, MD   1 Units at 05/26/12 1742  . insulin glargine (LANTUS) injection 21 Units  21 Units Subcutaneous QHS David Tat, MD   21 Units at 05/26/12 2215  . levothyroxine (SYNTHROID, LEVOTHROID) tablet 75 mcg  75 mcg Oral Daily Sorin C Laza, MD   75 mcg at 05/26/12 0952  . ondansetron (ZOFRAN) tablet 4 mg  4 mg Oral Q6H PRN Harvette C Jenkins, MD       Or  . ondansetron (ZOFRAN) injection 4 mg  4 mg Intravenous Q6H PRN Harvette C Jenkins, MD      . ondansetron (ZOFRAN) injection 4 mg  4 mg Intravenous Q6H PRN Brian L Chen, MD      . oxyCODONE (Oxy IR/ROXICODONE) immediate release tablet 5 mg  5 mg Oral Q4H PRN Harvette C Jenkins, MD   5 mg at 05/27/12 0522  . oxyCODONE (OXYCONTIN) 12 hr tablet 10 mg  10 mg Oral Q12H David Tat, MD   10 mg at 05/26/12 2214  . piperacillin-tazobactam (ZOSYN) IVPB 3.375 g  3.375 g Intravenous Q8H Alison Lydia Grimsley, PHARMD   3.375 g at   05/27/12 0639  . sertraline (ZOLOFT) tablet 100 mg  100 mg Oral Daily Sorin C Laza, MD   100 mg at 05/26/12 0952  . sodium chloride 0.9 % injection 10-40 mL  10-40 mL Intracatheter PRN Sorin C Laza, MD   10 mL at 05/26/12 0452  . Travoprost (BAK Free) (TRAVATAN) 0.004 % ophthalmic solution SOLN 1 drop  1 drop Both Eyes QHS Debby Crosley, MD   1 drop at 05/26/12 2200  . vancomycin (VANCOCIN) 750 mg in sodium chloride 0.9 % 150 mL IVPB  750 mg Intravenous Q24H David Tat, MD   750 mg at 05/27/12 0118  . vancomycin (VANCOCIN) IVPB 1000 mg/200 mL premix  1,000 mg Intravenous Q24H David Tat, MD      . vitamin C (ASCORBIC ACID) tablet 1,000 mg  1,000 mg Oral Daily Sorin C Laza, MD   1,000 mg at 05/26/12 0952  . zolpidem (AMBIEN) tablet 5 mg  5 mg Oral QHS PRN Harvette C Jenkins, MD      . DISCONTD: insulin glargine (LANTUS) injection 15 Units  15 Units Subcutaneous QHS Jessica U Vann, DO   15 Units at 05/25/12 2235    Assessment: Left foot cellulitis  with possible deep space abscess. However this is my first time seeing her foot today. She is currently scheduled for a bypass early next week. However, we need to make sure that she does not have any abscess ongoing.  Plan: CT scan of foot today if there is an abscess present she will need I&D of this tomorrow. This was discussed with the patient and the family today.  Philomina Leon, MD [facility] [place] Office: [phone] Pager: [phone]   CT scan reviewed.  She has an abscess in the left foot.  Will drain in OR tomorrow.  This may require extensive debridement or even amputation of toes but the sepsis needs to be controlled prior to bypass.  NPO p midnight Consent Procedure discussed with family by phone.  Huston Stonehocker, MD [facility] [place] Office: [phone] Pager: [phone]  

## 2012-05-28 NOTE — Progress Notes (Signed)
[facility] PROGRESS NOTE  TA FAIR ZOX:[id number] DOB: 1931/09/23 DOA: 05/21/2012 PCP: [name], MD  Assessment/Plan: Diabetic foot infection/abscess  -MRI not possible due to the patient's pacemaker  -Still suspect underlying osteomyelitis  -nuclear medicine WBC tag scan  -CT of the foot reveals abscess  -Appreciate Dr. Darrick Penna, patient to go to debridement today -Please obtain cultures during surgery  -Continue antibiotics Severe peripheral vascular disease  - revascularization after infection controlled  Diastolic CHF  -Continue strict I/Os, furosemide  --3115cc in 48 hours Acute on chronic CKD stage III  -Creatinine worsened partly due to diuresis and contrast  from CT Diabetes mellitus type 2  -Lantus decreased her to n.p.o. status for surgery today -Pre-meal insulin if patient increase his by mouth intake  -Patient continues to have poor by mouth intake  -Check prealbumin  Hypothyroidism  -Continue Synthroid  -Increase Synthroid to 88 mcg daily  Atrial fib  -does not appear to be good warfarin candidate due to falls, poor insight  -ASA after surgeries  Antibiotics:  Vancomycin September 14>>>  Zosyn September 14>>>    Procedures/Studies: Dg Chest 2 View  05/15/2012  *RADIOLOGY REPORT*  Clinical Data: Fever.  CHEST - 2 VIEW  Comparison: 01/24/2012.  Findings: The pacer wires are stable.  The cardiac silhouette, mediastinal and hilar contours are mildly prominent but unchanged. There is persistent central vascular congestion and possible mild interstitial edema.  No pleural effusion or focal infiltrate.  The bony thorax is intact.  IMPRESSION: Stable cardiac enlargement and central vascular congestion with possible mild interstitial edema.   Original Report Authenticated By: P. Loralie Champagne, M.D.    Ct Tibia Fibula Left W Contrast  05/27/2012  *RADIOLOGY REPORT*  Clinical Data:  Diabetic with foot pain, erythema and swelling. Possible abscess.  CT OF  THE LEFT LOWER LEG WITH CONTRAST  Technique:  Multidetector CT imaging of the left lower leg was performed according to the standard protocol following intravenous contrast administration. Multiplanar CT image reconstructions were also generated.  Contrast:  80 ml Omnipaque-300  Comparison:  Foot radiographs 05/15/2012 and today.  Findings:  Imaging was performed from the proximal left lower leg through the left foot as specifically requested.  There is an ill-defined peripherally enhancing fluid collection within the fourth webspace of the foot.  This contains a small air bubble and measures up to 2.1 cm in greatest dimension. Fluid extends distally into the fourth toe.  There is some additional soft tissue emphysema tracking medially within the plantar forefoot within the plantar aspects of the second, third and fourth toes. No other measurable fluid collections are seen.  There is subcutaneous edema throughout the foot to extending into the distal lower leg.  Superficial varicosities are noted within the lower leg.  Bone windows demonstrate no cortical destruction or significant arthropathic changes.  IMPRESSION:  1.  There is an ill-defined fluid collection within the fourth webspace with distal extension into the fourth toe and along the plantar aspects of the second and third toes.  This collection demonstrates peripheral enhancement and air bubbles and is consistent with an abscess. 2.  No CT evidence of osteomyelitis. 3.  Nonspecific subcutaneous edema in the distal lower leg and plantar foot.   Original Report Authenticated By: [name], M.D.    Dg Chest Port 1 View  05/25/2012  *RADIOLOGY REPORT*  Clinical Data: PICC line placement.  PORTABLE CHEST - 1 VIEW  Comparison: 05/21/2012  Findings: Right-sided PICC line noted with tip projecting over the  SVC.  No pneumothorax or complicating feature.  Dual lead pacer noted.  There is mild cardiomegaly along with stable interstitial accentuation.   Peripheral airspace opacity in the right mid lung appears slightly less striking.  Atherosclerotic calcification in the aortic arch noted.  IMPRESSION:  1.  Right PICC line tip:  SVC.  No pneumothorax or complicating feature. 2.  Cardiomegaly with mild interstitial accentuation, suspicious for mild interstitial edema. 3.  Faint airspace disease peripherally in the right mid lung appears improved.   Original Report Authenticated By: [name], M.D.    Dg Foot 2 Views Left  05/27/2012  *RADIOLOGY REPORT*  Clinical Data: Diabetic foot infection  LEFT FOOT - 2 VIEW  Comparison: 05/15/2012  Findings: Scattered soft tissue swelling. Diffuse osseous demineralization. Foci of soft tissue gas identified between the bases of the fourth and fifth toes. Joint spaces preserved. Hallux valgus. No acute fracture, dislocation or bone destruction.  IMPRESSION: Soft tissue gas between the bases of the fourth and fifth toes consistent with soft tissue infection. No definite evidence of osteomyelitis. If further soft tissue assessment needed or persistent clinical concern for occult osteomyelitis remains, consider MR imaging of the left foot.   Original Report Authenticated By: [name], M.D.    Dg Foot Complete Left  05/15/2012  *RADIOLOGY REPORT*  Clinical Data: Diabetic foot ulcers.  LEFT FOOT - COMPLETE 3+ VIEW  Comparison: None  Findings: The joint spaces are maintained.  No acute bony findings or destructive bony changes.  IMPRESSION: No plain film findings for osteomyelitis.   Original Report Authenticated By: P. Loralie Champagne, M.D.        Code Status: Full Family Communication niece at bedside Disposition Plan: SNF when medically stable  Subjective: Patient complains of a little shortness of breath. She states this is not worse than usual. Denies any chest pain, nausea, vomiting, diarrhea, abdominal pain, dizziness, fever, chills.  Objective: Filed Vitals:   05/27/12 2050 05/27/12 2221  05/28/12 0105 05/28/12 0640  BP: 129/68 105/50 119/55 109/54  Pulse: 70 70 70 70  Temp: 99.5 F (37.5 C) 98.5 F (36.9 C) 99.2 F (37.3 C) 98.1 F (36.7 C)  TempSrc: Oral Oral Oral Oral  Resp: 16 18 20 20   Height:      Weight:      SpO2: 97% 91% 98% 93%    Intake/Output Summary (Last 24 hours) at 05/28/12 0936 Last data filed at 05/28/12 0640  Gross per 24 hour  Intake    550 ml  Output   1300 ml  Net   -750 ml   Weight change:  Exam:   General:  Pt is alert, follows commands appropriately, not in acute distress  HEENT: No icterus, No thrush,[place]/AT  Cardiovascular: Regular rate and rhythm, S1/S2, no rubs, no gallops  Respiratory: Bibasilar crackles. No wheezes or rhonchi. Good air movement.  Abdomen: Soft, non tender, non distended, bowel sounds present, no guarding  Extremities: No lymphangitis, No petechiae, No rashes, no synovitis; left foot plantar distal fifth metatarsal area with purulent drainage, no crepitance, no necrosis  Data Reviewed: Basic Metabolic Panel:  Lab 05/28/12 4098 05/27/12 0500 05/26/12 1130 05/26/12 0500 05/25/12 0545  NA 138 135 134* 134* 136  K 4.4 3.5 4.1 4.2 4.0  CL 101 99 100 101 104  CO2 31 31 27 27 24   GLUCOSE 191* 125* 297* 265* 172*  BUN 20 18 17 19 20   CREATININE 1.63* 1.19* 1.19* 1.17* 1.11*  CALCIUM 8.9 8.8 8.8  9.0 9.2  MG -- -- -- -- --  PHOS -- -- -- -- --   Liver Function Tests: No results found for this basename: AST:5,ALT:5,ALKPHOS:5,BILITOT:5,PROT:5,ALBUMIN:5 in the last 168 hours No results found for this basename: LIPASE:5,AMYLASE:5 in the last 168 hours No results found for this basename: AMMONIA:5 in the last 168 hours CBC:  Lab 05/28/12 0500 05/27/12 0500 05/26/12 0500 05/25/12 0545 05/24/12 1432 05/22/12 0045  WBC 16.6* 14.9* 14.7* 14.4* 16.7* --  NEUTROABS -- -- -- -- -- 9.2*  HGB 9.5* 9.4* 9.8* 9.6* 10.7* --  HCT 30.3* 29.1* 29.9* 29.9* 33.1* --  MCV 90.7 89.5 90.3 89.0 89.2 --  PLT 330 311 323 299 301  --   Cardiac Enzymes:  Lab 05/24/12 0807 05/24/12 0037 05/23/12 2000  CKTOTAL -- -- --  CKMB -- -- --  CKMBINDEX -- -- --  TROPONINI <0.30 <0.30 <0.30   BNP: No components found with this basename: POCBNP:5 CBG:  Lab 05/27/12 2219 05/27/12 1712 05/27/12 1122 05/27/12 0812 05/26/12 2158  GLUCAP 216* 164* 175* 106* 199*    Recent Results (from the past 240 hour(s))  CULTURE, BLOOD (ROUTINE X 2)     Status: Normal (Preliminary result)   Collection Time   05/23/12  8:00 PM      Component Value Range Status Comment   Specimen Description BLOOD LEFT HAND   Final    Special Requests BOTTLES DRAWN AEROBIC ONLY 10CC   Final    Culture  Setup Time 05/24/2012 14:17   Final    Culture     Final    Value:        BLOOD CULTURE RECEIVED NO GROWTH TO DATE CULTURE WILL BE HELD FOR 5 DAYS BEFORE ISSUING A FINAL NEGATIVE REPORT   Report Status PENDING   Incomplete   CULTURE, BLOOD (ROUTINE X 2)     Status: Normal (Preliminary result)   Collection Time   05/23/12  8:14 PM      Component Value Range Status Comment   Specimen Description BLOOD RIGHT HAND   Final    Special Requests BOTTLES DRAWN AEROBIC ONLY 10CC   Final    Culture  Setup Time 05/24/2012 14:18   Final    Culture     Final    Value:        BLOOD CULTURE RECEIVED NO GROWTH TO DATE CULTURE WILL BE HELD FOR 5 DAYS BEFORE ISSUING A FINAL NEGATIVE REPORT   Report Status PENDING   Incomplete      Scheduled Meds:   . furosemide  20 mg Oral Daily  . insulin aspart  0-5 Units Subcutaneous QHS  . insulin aspart  0-9 Units Subcutaneous TID WC  . insulin glargine  11 Units Subcutaneous QHS  . levothyroxine  88 mcg Oral QAC breakfast  . oxyCODONE  10 mg Oral Q12H  . piperacillin-tazobactam (ZOSYN)  IV  3.375 g Intravenous Q8H  . potassium chloride  40 mEq Oral Once  . sertraline  100 mg Oral Daily  . Travoprost (BAK Free)  1 drop Both Eyes QHS  . vancomycin  1,000 mg Intravenous Q24H  . vitamin C  1,000 mg Oral Daily  . DISCONTD:  furosemide  40 mg Oral Daily  . DISCONTD: insulin glargine  21 Units Subcutaneous QHS  . DISCONTD: levothyroxine  75 mcg Oral Daily  . DISCONTD: levothyroxine  75 mcg Oral Q24H   Continuous Infusions:    Shaylinn Hladik, DO  [facility] Pager [phone]  If  7PM-7AM, please contact night-coverage [email or web address] Password [email or web address] 05/28/2012, 9:36 AM   LOS: 7 days

## 2012-05-28 NOTE — Interval H&P Note (Signed)
History and Physical Interval Note:  05/28/2012 11:58 AM  Brittney [name]  has presented today for surgery, with the diagnosis of ischemic foot  The various methods of treatment have been discussed with the patient and family. After consideration of risks, benefits and other options for treatment, the patient has consented to  Procedure(s) (LRB) with comments: IRRIGATION AND DEBRIDEMENT EXTREMITY (Left) - possible amputation toes as a surgical intervention .  The patient's history has been reviewed, patient examined, no change in status, stable for surgery.  I have reviewed the patient's chart and labs.  Questions were answered to the patient's satisfaction.     FIELDS,CHARLES E

## 2012-05-28 NOTE — Transfer of Care (Signed)
Immediate Anesthesia Transfer of Care Note  Patient: Brittney [name]  Procedure(s) Performed: Procedure(s) (LRB) with comments: IRRIGATION AND DEBRIDEMENT EXTREMITY (Left) - possible amputation toes  Patient Location: PACU  Anesthesia Type: General  Level of Consciousness: awake, alert , oriented and patient cooperative  Airway & Oxygen Therapy: Patient Spontanous Breathing and Patient connected to nasal cannula oxygen  Post-op Assessment: Report given to PACU RN and Post -op Vital signs reviewed and stable  Post vital signs: Reviewed and stable  Complications: No apparent anesthesia complications

## 2012-05-28 NOTE — Preoperative (Signed)
Beta Blockers   Reason not to administer Beta Blockers:Not Applicable 

## 2012-05-28 NOTE — Anesthesia Preprocedure Evaluation (Addendum)
Anesthesia Evaluation  Patient identified by MRN, date of birth, ID band Patient awake    Reviewed: Allergy & Precautions, H&P , NPO status , Patient's Chart, lab work & pertinent test results  Airway Mallampati: II TM Distance: >3 FB     Dental  (+) Edentulous Upper and Lower Dentures   Pulmonary    Pulmonary exam normal       Cardiovascular hypertension, Pt. on medications + CAD and +CHF + dysrhythmias Atrial Fibrillation + pacemaker Rhythm:Regular Rate:Normal  ------------------------------------------------------------ Study Conclusions  - Left ventricle: The cavity size was normal. Wall thickness   was increased in a pattern of moderate LVH. Systolic   function was normal. The estimated ejection fraction was   in the range of 55% to 60%. Wall motion was normal; there   were no regional wall motion abnormalities. Features are   consistent with a pseudonormal left ventricular filling   pattern, with concomitant abnormal relaxation and   increased filling pressure (grade 2 diastolic   dysfunction). - Ventricular septum: Septal motion showed paradox. - Aortic valve: Trileaflet; mildly calcified leaflets.   Trivial regurgitation. Mean gradient: 4mm Hg (S). - Mitral valve: Calcified annulus. Mild regurgitation. - Left atrium: The atrium was mildly dilated. - Right ventricle: Pacer wire or catheter noted in right   ventricle. - Right atrium: Pacer wire or catheter noted in right   atrium. - Tricuspid valve: Moderate regurgitation. Peak RV-RA   gradient: 44mm Hg (S). - Pulmonic valve: Mild regurgitation. - Pulmonary arteries: Systolic pressure was moderately   increased. - Pericardium, extracardiac: There was no pericardial   effusion.     Neuro/Psych Depression DementiaTIA   GI/Hepatic GERD-  Medicated,  Endo/Other  diabetes, Type 2, Insulin DependentHypothyroidism   Renal/GU Renal InsufficiencyRenal disease       Musculoskeletal  (+) Arthritis -, Osteoarthritis,    Abdominal   Peds  Hematology   Anesthesia Other Findings   Reproductive/Obstetrics                        Anesthesia Physical Anesthesia Plan  ASA: III  Anesthesia Plan: General   Post-op Pain Management:    Induction:   Airway Management Planned: LMA  Additional Equipment:   Intra-op Plan:   Post-operative Plan: Extubation in OR  Informed Consent: I have reviewed the patients History and Physical, chart, labs and discussed the procedure including the risks, benefits and alternatives for the proposed anesthesia with the patient or authorized representative who has indicated his/her understanding and acceptance.     Plan Discussed with: CRNA, Anesthesiologist and Surgeon  Anesthesia Plan Comments:        Anesthesia Quick Evaluation

## 2012-05-28 NOTE — Op Note (Signed)
Procedure: Incision and drainage of left foot abscess with transmetatarsal amputation of toes 3,4 and 5 left foot Preoperative diagnosis: Abscess left foot  Postoperative diagnosis: Same  Anesthesia: Gen.  Assistant: Nurse  Operative findings: Abscess and necrotic tissue interphalangeal space toes 345, cultures sent  Operative details: After obtaining informed consent, the patient was taken to the operating room. The patient was placed in supine position on the operating room table. After induction of general anesthesia the patient's entire left foot was prepped and draped in usual sterile fashion. Next a transverse incision was made through a pre-existing ulcer over the left fifth metatarsal head. Upon opening this wound brown-yellow purulent material was encountered.   The incision was extended to the level of the third toe metatarsal head. There was also purulent material and necrotic debris at this location. At this point I did not believe that the third fourth or fifth toe was salvageable. A bone cutter was used to transect the metatarsal just proximal to the head of toes 34 and 5. Cultures were taken from the purulent and necrotic debris. There was some oozing from the skin and soft tissues. All of the necrotic debris was debrided sharply. The wound was thoroughly irrigated with normal saline solution. A wet to dry dressing was applied. Patient tolerated procedure well and there were no complications. Instrument sponge and needle counts correct at the end of the case. The patient was taken to recovery room in stable condition.  Fabienne Bruns, MD [facility] Office: [phone] Pager: [phone]

## 2012-05-29 ENCOUNTER — Inpatient Hospital Stay (HOSPITAL_COMMUNITY): Payer: Medicare Other

## 2012-05-29 ENCOUNTER — Encounter (HOSPITAL_COMMUNITY): Payer: Medicare Other

## 2012-05-29 DIAGNOSIS — N179 Acute kidney failure, unspecified: Secondary | ICD-10-CM

## 2012-05-29 DIAGNOSIS — N289 Disorder of kidney and ureter, unspecified: Secondary | ICD-10-CM

## 2012-05-29 LAB — BASIC METABOLIC PANEL
GFR calc Af Amer: 25 mL/min — ABNORMAL LOW (ref >90)
GFR calc non Af Amer: 21 mL/min — ABNORMAL LOW (ref >90)
Potassium: 4.5 mEq/L (ref 3.5–5.1)
Sodium: 132 mEq/L — ABNORMAL LOW (ref 135–145)

## 2012-05-29 LAB — GLUCOSE, CAPILLARY: Glucose-Capillary: 198 mg/dL — ABNORMAL HIGH (ref 70–99)

## 2012-05-29 LAB — CBC
Hemoglobin: 9.1 g/dL — ABNORMAL LOW (ref 12.0–15.0)
RBC: 3.22 MIL/uL — ABNORMAL LOW (ref 3.87–5.11)
WBC: 14.1 10*3/uL — ABNORMAL HIGH (ref 4.0–10.5)

## 2012-05-29 MED ORDER — GLUCERNA SHAKE PO LIQD
237.0000 mL | Freq: Three times a day (TID) | ORAL | Status: DC
  Administered 2012-05-29 – 2012-06-08 (×23): 237 mL via ORAL

## 2012-05-29 MED ORDER — PIPERACILLIN-TAZOBACTAM IN DEX 2-0.25 GM/50ML IV SOLN
2.2500 g | Freq: Four times a day (QID) | INTRAVENOUS | Status: DC
  Administered 2012-05-29 – 2012-06-01 (×12): 2.25 g via INTRAVENOUS
  Filled 2012-05-29 (×16): qty 50

## 2012-05-29 MED ORDER — ENOXAPARIN SODIUM 30 MG/0.3ML ~~LOC~~ SOLN
30.0000 mg | SUBCUTANEOUS | Status: DC
  Administered 2012-05-29 – 2012-06-02 (×5): 30 mg via SUBCUTANEOUS
  Filled 2012-05-29 (×6): qty 0.3

## 2012-05-29 MED ORDER — BENEPROTEIN PO POWD
1.0000 | Freq: Three times a day (TID) | ORAL | Status: DC
  Administered 2012-05-30 – 2012-06-08 (×25): 6 g via ORAL
  Filled 2012-05-29: qty 227

## 2012-05-29 MED ORDER — SODIUM CHLORIDE 0.9 % IJ SOLN
50.0000 mL | INTRAMUSCULAR | Status: DC | PRN
  Filled 2012-05-29: qty 50

## 2012-05-29 MED ORDER — OXYCODONE HCL 10 MG PO TB12
10.0000 mg | ORAL_TABLET | Freq: Once | ORAL | Status: AC
  Administered 2012-05-29: 10 mg via ORAL
  Filled 2012-05-29: qty 1

## 2012-05-29 MED ORDER — SODIUM CHLORIDE 0.9 % IV SOLN: INTRAVENOUS | Status: DC

## 2012-05-29 NOTE — Progress Notes (Signed)
Patient ID: Brittney [name], female   DOB: 11-May-1932, 78 y.o.   MRN: [id number]   SUBJECTIVE:  The patient is sleepy this morning. She's not having any chest pain.    Filed Vitals:   05/28/12 1815 05/28/12 2106 05/29/12 0223 05/29/12 0601  BP: 116/54 111/51 133/54 125/55  Pulse: 70 72 69 70  Temp: 97.4 F (36.3 C) 97.6 F (36.4 C) 98.5 F (36.9 C) 99.6 F (37.6 C)  TempSrc: Oral Oral Oral Oral  Resp: 18 18 16 14   Height:      Weight:      SpO2: 99% 95% 99% 97%    Intake/Output Summary (Last 24 hours) at 05/29/12 1131 Last data filed at 05/29/12 0601  Gross per 24 hour  Intake   1140 ml  Output    575 ml  Net    565 ml    LABS: Basic Metabolic Panel:  Basename 05/29/12 0540 05/28/12 0500  NA 132* 138  K 4.5 4.4  CL 95* 101  CO2 31 31  GLUCOSE 206* 191*  BUN 23 20  CREATININE 2.10* 1.63*  CALCIUM 8.8 8.9  MG -- --  PHOS -- --   Liver Function Tests: No results found for this basename: AST:2,ALT:2,ALKPHOS:2,BILITOT:2,PROT:2,ALBUMIN:2 in the last 72 hours No results found for this basename: LIPASE:2,AMYLASE:2 in the last 72 hours CBC:  Basename 05/29/12 0540 05/28/12 0500  WBC 14.1* 16.6*  NEUTROABS -- --  HGB 9.1* 9.5*  HCT 29.3* 30.3*  MCV 91.0 90.7  PLT 341 330   Cardiac Enzymes: No results found for this basename: CKTOTAL:3,CKMB:3,CKMBINDEX:3,TROPONINI:3 in the last 72 hours BNP: No components found with this basename: POCBNP:3 D-Dimer: No results found for this basename: DDIMER:2 in the last 72 hours Hemoglobin A1C: No results found for this basename: HGBA1C in the last 72 hours Fasting Lipid Panel:  Basename 05/27/12 0500  CHOL 128  HDL 20*  LDLCALC 92  TRIG 79  CHOLHDL 6.4  LDLDIRECT --   Thyroid Function Tests:  Basename 05/27/12 0500  TSH 5.714*  T4TOTAL --  T3FREE --  THYROIDAB --    RADIOLOGY: Dg Chest 2 View  05/15/2012  *RADIOLOGY REPORT*  Clinical Data: Fever.  CHEST - 2 VIEW  Comparison: 01/24/2012.  Findings: The pacer  wires are stable.  The cardiac silhouette, mediastinal and hilar contours are mildly prominent but unchanged. There is persistent central vascular congestion and possible mild interstitial edema.  No pleural effusion or focal infiltrate.  The bony thorax is intact.  IMPRESSION: Stable cardiac enlargement and central vascular congestion with possible mild interstitial edema.   Original Report Authenticated By: P. Loralie Champagne, M.D.    Ct Tibia Fibula Left W Contrast  05/27/2012  *RADIOLOGY REPORT*  Clinical Data:  Diabetic with foot pain, erythema and swelling. Possible abscess.  CT OF THE LEFT LOWER LEG WITH CONTRAST  Technique:  Multidetector CT imaging of the left lower leg was performed according to the standard protocol following intravenous contrast administration. Multiplanar CT image reconstructions were also generated.  Contrast:  80 ml Omnipaque-300  Comparison:  Foot radiographs 05/15/2012 and today.  Findings:  Imaging was performed from the proximal left lower leg through the left foot as specifically requested.  There is an ill-defined peripherally enhancing fluid collection within the fourth webspace of the foot.  This contains a small air bubble and measures up to 2.1 cm in greatest dimension. Fluid extends distally into the fourth toe.  There is some additional soft tissue emphysema tracking medially  within the plantar forefoot within the plantar aspects of the second, third and fourth toes. No other measurable fluid collections are seen.  There is subcutaneous edema throughout the foot to extending into the distal lower leg.  Superficial varicosities are noted within the lower leg.  Bone windows demonstrate no cortical destruction or significant arthropathic changes.  IMPRESSION:  1.  There is an ill-defined fluid collection within the fourth webspace with distal extension into the fourth toe and along the plantar aspects of the second and third toes.  This collection demonstrates peripheral  enhancement and air bubbles and is consistent with an abscess. 2.  No CT evidence of osteomyelitis. 3.  Nonspecific subcutaneous edema in the distal lower leg and plantar foot.   Original Report Authenticated By: [name], M.D.    Dg Chest Port 1 View  05/25/2012  *RADIOLOGY REPORT*  Clinical Data: PICC line placement.  PORTABLE CHEST - 1 VIEW  Comparison: 05/21/2012  Findings: Right-sided PICC line noted with tip projecting over the SVC.  No pneumothorax or complicating feature.  Dual lead pacer noted.  There is mild cardiomegaly along with stable interstitial accentuation.  Peripheral airspace opacity in the right mid lung appears slightly less striking.  Atherosclerotic calcification in the aortic arch noted.  IMPRESSION:  1.  Right PICC line tip:  SVC.  No pneumothorax or complicating feature. 2.  Cardiomegaly with mild interstitial accentuation, suspicious for mild interstitial edema. 3.  Faint airspace disease peripherally in the right mid lung appears improved.   Original Report Authenticated By: [name], M.D.    Dg Foot 2 Views Left  05/27/2012  *RADIOLOGY REPORT*  Clinical Data: Diabetic foot infection  LEFT FOOT - 2 VIEW  Comparison: 05/15/2012  Findings: Scattered soft tissue swelling. Diffuse osseous demineralization. Foci of soft tissue gas identified between the bases of the fourth and fifth toes. Joint spaces preserved. Hallux valgus. No acute fracture, dislocation or bone destruction.  IMPRESSION: Soft tissue gas between the bases of the fourth and fifth toes consistent with soft tissue infection. No definite evidence of osteomyelitis. If further soft tissue assessment needed or persistent clinical concern for occult osteomyelitis remains, consider MR imaging of the left foot.   Original Report Authenticated By: [name], M.D.    Dg Foot Complete Left  05/15/2012  *RADIOLOGY REPORT*  Clinical Data: Diabetic foot ulcers.  LEFT FOOT - COMPLETE 3+ VIEW  Comparison:  None  Findings: The joint spaces are maintained.  No acute bony findings or destructive bony changes.  IMPRESSION: No plain film findings for osteomyelitis.   Original Report Authenticated By: P. Loralie Champagne, M.D.     PHYSICAL EXAM  The patient is sleeping. She is sitting in a chair. She has no pain at this time. There is no jugulovenous distention. Lung exam reveal scattered rhonchi. There is question of some rales. The patient has no significant edema in her right leg.   ASSESSMENT AND PLAN:   CAD (coronary artery disease)    Coronary disease is stable. No change in therapy.   Pacemaker   PAD (peripheral artery disease)   Hopefully patient will be able to have surgery soon.   Acute on chronic diastolic CHF (congestive heart failure)   Several days ago I had thought that the patient had some volume overload. There has been a mild diuresis. It is very difficult to assess her volume status at this time. Her lung exam reveals rhonchi and possibly some rales. However she is no  significant edema. With her change in renal function we cannot diaries her any further.  Acute renal insufficiency   The patient is having an acute rise in her creatinine. Etiology is not completely clear. She has been diaries only modestly. It is possible that this could be the culprit.  However, I am concerned that she still may have some rales. It does not appear that there are any medications that can be implicated. I do not know if the patient had any hypotension around the time of the procedure with her foot the other day. The plan for today will be to give her a small fluid back IV. We will also obtain another chest x-ray. Renal function will be followed carefully. Did she receive vancomycin in the past few days? This may be ATN as mentioned by vascular surgery.  Willa Rough 05/29/2012 11:31 AM

## 2012-05-29 NOTE — Progress Notes (Signed)
[facility] PROGRESS NOTE  Brittney [name] ZOX:[id number] DOB: 10/14/1931 DOA: 05/21/2012 PCP: [name], MD  Assessment/Plan: Acute on chronic CKD stage III  -Creatinine worsen partly due to diuresis and IV contrast from CT foot (9/19) -Patient has been on vancomycin which may play a role but drug levels have actually been subtherapeutic -Check urine for eosinophils although less likely to be interstitial nephritis from beta-lactam Diabetic foot infection/abscess  -MRI not possible due to the patient's pacemaker  -suspect underlying osteomyelitis due to her surgical findings on September 20 -nuclear medicine WBC tag scan  -CT of the foot reveals abscess (9/19) -Appreciate Dr. Darrick Penna, s/p I&D on 9/20 -Followup surgical cultures and adjust antibiotics accordingly -Continue continue Zosyn  Severe peripheral vascular disease  - revascularization after infection controlled  Diastolic CHF  -Continue strict I/Os, furosemide  --3115cc with Lasix, but remains positive fluid balance for the hospitalization Diabetes mellitus type 2  -Patient continues to have poor by mouth intake  -Unfortunately, none of her CBGs were recorded today -continue current regimen and monitor CBGs Hypothyroidism  -Increased Synthroid to 88 mcg daily  Atrial fib  -does not appear to be good warfarin candidate due to falls, poor insight  -ASA after surgeries Severe protein calorie malnutrition -Pre-albumin 4.0 -Start beneprotein -Glucerna supplement    Family Communication:   family at beside Disposition Plan:   SNF when medically stable   Antibiotics:  Vancomycin September 14>>>  Zosyn September 14>>>       Procedures/Studies: Dg Chest 2 View  05/15/2012  *RADIOLOGY REPORT*  Clinical Data: Fever.  CHEST - 2 VIEW  Comparison: 01/24/2012.  Findings: The pacer wires are stable.  The cardiac silhouette, mediastinal and hilar contours are mildly prominent but unchanged. There is persistent  central vascular congestion and possible mild interstitial edema.  No pleural effusion or focal infiltrate.  The bony thorax is intact.  IMPRESSION: Stable cardiac enlargement and central vascular congestion with possible mild interstitial edema.   Original Report Authenticated By: P. Loralie Champagne, M.D.    Ct Tibia Fibula Left W Contrast  05/27/2012  *RADIOLOGY REPORT*  Clinical Data:  Diabetic with foot pain, erythema and swelling. Possible abscess.  CT OF THE LEFT LOWER LEG WITH CONTRAST  Technique:  Multidetector CT imaging of the left lower leg was performed according to the standard protocol following intravenous contrast administration. Multiplanar CT image reconstructions were also generated.  Contrast:  80 ml Omnipaque-300  Comparison:  Foot radiographs 05/15/2012 and today.  Findings:  Imaging was performed from the proximal left lower leg through the left foot as specifically requested.  There is an ill-defined peripherally enhancing fluid collection within the fourth webspace of the foot.  This contains a small air bubble and measures up to 2.1 cm in greatest dimension. Fluid extends distally into the fourth toe.  There is some additional soft tissue emphysema tracking medially within the plantar forefoot within the plantar aspects of the second, third and fourth toes. No other measurable fluid collections are seen.  There is subcutaneous edema throughout the foot to extending into the distal lower leg.  Superficial varicosities are noted within the lower leg.  Bone windows demonstrate no cortical destruction or significant arthropathic changes.  IMPRESSION:  1.  There is an ill-defined fluid collection within the fourth webspace with distal extension into the fourth toe and along the plantar aspects of the second and third toes.  This collection demonstrates peripheral enhancement and air bubbles and is consistent with an abscess. 2.  No CT evidence of osteomyelitis. 3.  Nonspecific subcutaneous  edema in the distal lower leg and plantar foot.   Original Report Authenticated By: [name], M.D.    Dg Chest Port 1 View  05/29/2012  *RADIOLOGY REPORT*  Clinical Data: Evaluate for congestive heart failure.  PORTABLE CHEST - 1 VIEW  Comparison: Chest x-ray 05/25/2012.  Findings: Lung volumes are low.  No consolidative airspace disease. Probable trace bilateral pleural effusions.  Pulmonary venous congestion without frank pulmonary edema.  Mild cardiomegaly is unchanged. The patient is rotated to the left on today's exam, resulting in distortion of the mediastinal contours and reduced diagnostic sensitivity and specificity for mediastinal pathology. Atherosclerosis of the thoracic aorta. There is a right upper extremity PICC with tip terminating in the distal superior vena cava. Left-sided pacemaker device in place with lead tips projecting over the expected location of the right atrium and right ventricular apex.  IMPRESSION: 1.  Mild cardiomegaly with pulmonary venous congestion, but no frank pulmonary edema. 2.  Trace bilateral pleural effusions. 3.  Atherosclerosis. 4.  Support apparatus, as above.   Original Report Authenticated By: Florencia Reasons, M.D.    Dg Chest Port 1 View  05/25/2012  *RADIOLOGY REPORT*  Clinical Data: PICC line placement.  PORTABLE CHEST - 1 VIEW  Comparison: 05/21/2012  Findings: Right-sided PICC line noted with tip projecting over the SVC.  No pneumothorax or complicating feature.  Dual lead pacer noted.  There is mild cardiomegaly along with stable interstitial accentuation.  Peripheral airspace opacity in the right mid lung appears slightly less striking.  Atherosclerotic calcification in the aortic arch noted.  IMPRESSION:  1.  Right PICC line tip:  SVC.  No pneumothorax or complicating feature. 2.  Cardiomegaly with mild interstitial accentuation, suspicious for mild interstitial edema. 3.  Faint airspace disease peripherally in the right mid lung appears  improved.   Original Report Authenticated By: [name], M.D.    Dg Foot 2 Views Left  05/27/2012  *RADIOLOGY REPORT*  Clinical Data: Diabetic foot infection  LEFT FOOT - 2 VIEW  Comparison: 05/15/2012  Findings: Scattered soft tissue swelling. Diffuse osseous demineralization. Foci of soft tissue gas identified between the bases of the fourth and fifth toes. Joint spaces preserved. Hallux valgus. No acute fracture, dislocation or bone destruction.  IMPRESSION: Soft tissue gas between the bases of the fourth and fifth toes consistent with soft tissue infection. No definite evidence of osteomyelitis. If further soft tissue assessment needed or persistent clinical concern for occult osteomyelitis remains, consider MR imaging of the left foot.   Original Report Authenticated By: [name], M.D.    Dg Foot Complete Left  05/15/2012  *RADIOLOGY REPORT*  Clinical Data: Diabetic foot ulcers.  LEFT FOOT - COMPLETE 3+ VIEW  Comparison: None  Findings: The joint spaces are maintained.  No acute bony findings or destructive bony changes.  IMPRESSION: No plain film findings for osteomyelitis.   Original Report Authenticated By: P. Loralie Champagne, M.D.          Subjective: Patient had some nausea today. She denies any vomiting. Appetite has been poor. She denies any chest pain, shortness of breath, abdominal pain, area, headache, dizziness. She denies any fevers or chills.  Objective: Filed Vitals:   05/29/12 0223 05/29/12 0601 05/29/12 1100 05/29/12 1430  BP: 133/54 125/55 135/52 124/53  Pulse: 69 70 69 70  Temp: 98.5 F (36.9 C) 99.6 F (37.6 C) 99.9 F (37.7 C) 98.9 F (37.2 C)  TempSrc: Oral Oral Oral Oral  Resp: 16 14 16 18   Height:      Weight:      SpO2: 99% 97% 98% 98%    Intake/Output Summary (Last 24 hours) at 05/29/12 1805 Last data filed at 05/29/12 1500  Gross per 24 hour  Intake    500 ml  Output    575 ml  Net    -75 ml   Weight change:  Exam:   General:   Pt is alert, follows commands appropriately, not in acute distress  HEENT: No icterus, No thrush, No neck mass, [place]/AT  Cardiovascular: RRR, S1/S2, no rubs, no gallops  Respiratory: Clear to auscultation bilaterally, no wheezing, no crackles, no rhonchi  Abdomen: Soft, non tender, non distended, bowel sounds present, no guarding  Extremities: Left foot is bandaged. Trace edema in the bilateral lower extremities. The foot without any lymphangitis, necrosis, crepitance   Data Reviewed: Basic Metabolic Panel:  Lab 05/29/12 1610 05/28/12 0500 05/27/12 0500 05/26/12 1130 05/26/12 0500  NA 132* 138 135 134* 134*  K 4.5 4.4 3.5 4.1 4.2  CL 95* 101 99 100 101  CO2 31 31 31 27 27   GLUCOSE 206* 191* 125* 297* 265*  BUN 23 20 18 17 19   CREATININE 2.10* 1.63* 1.19* 1.19* 1.17*  CALCIUM 8.8 8.9 8.8 8.8 9.0  MG -- -- -- -- --  PHOS -- -- -- -- --   Liver Function Tests: No results found for this basename: AST:5,ALT:5,ALKPHOS:5,BILITOT:5,PROT:5,ALBUMIN:5 in the last 168 hours No results found for this basename: LIPASE:5,AMYLASE:5 in the last 168 hours No results found for this basename: AMMONIA:5 in the last 168 hours CBC:  Lab 05/29/12 0540 05/28/12 0500 05/27/12 0500 05/26/12 0500 05/25/12 0545  WBC 14.1* 16.6* 14.9* 14.7* 14.4*  NEUTROABS -- -- -- -- --  HGB 9.1* 9.5* 9.4* 9.8* 9.6*  HCT 29.3* 30.3* 29.1* 29.9* 29.9*  MCV 91.0 90.7 89.5 90.3 89.0  PLT 341 330 311 323 299   Cardiac Enzymes:  Lab 05/24/12 0807 05/24/12 0037 05/23/12 2000  CKTOTAL -- -- --  CKMB -- -- --  CKMBINDEX -- -- --  TROPONINI <0.30 <0.30 <0.30   BNP: No components found with this basename: POCBNP:5 CBG:  Lab 05/28/12 2209 05/28/12 1721 05/28/12 1418 05/27/12 2219 05/27/12 1712  GLUCAP 178* 182* 136* 216* 164*    Recent Results (from the past 240 hour(s))  CULTURE, BLOOD (ROUTINE X 2)     Status: Normal (Preliminary result)   Collection Time   05/23/12  8:00 PM      Component Value Range Status  Comment   Specimen Description BLOOD LEFT HAND   Final    Special Requests BOTTLES DRAWN AEROBIC ONLY 10CC   Final    Culture  Setup Time 05/24/2012 14:17   Final    Culture     Final    Value:        BLOOD CULTURE RECEIVED NO GROWTH TO DATE CULTURE WILL BE HELD FOR 5 DAYS BEFORE ISSUING A FINAL NEGATIVE REPORT   Report Status PENDING   Incomplete   CULTURE, BLOOD (ROUTINE X 2)     Status: Normal (Preliminary result)   Collection Time   05/23/12  8:14 PM      Component Value Range Status Comment   Specimen Description BLOOD RIGHT HAND   Final    Special Requests BOTTLES DRAWN AEROBIC ONLY 10CC   Final    Culture  Setup Time 05/24/2012 14:18   Final  Culture     Final    Value:        BLOOD CULTURE RECEIVED NO GROWTH TO DATE CULTURE WILL BE HELD FOR 5 DAYS BEFORE ISSUING A FINAL NEGATIVE REPORT   Report Status PENDING   Incomplete   CULTURE, ROUTINE-ABSCESS     Status: Normal (Preliminary result)   Collection Time   05/28/12 10:58 AM      Component Value Range Status Comment   Specimen Description ABSCESS FOOT LEFT   Final    Special Requests NONE   Final    Gram Stain PENDING   Incomplete    Culture Culture reincubated for better growth   Final    Report Status PENDING   Incomplete      Scheduled Meds:   . insulin aspart  0-5 Units Subcutaneous QHS  . insulin aspart  0-9 Units Subcutaneous TID WC  . insulin glargine  11 Units Subcutaneous QHS  . levothyroxine  88 mcg Oral QAC breakfast  . oxyCODONE  10 mg Oral Q12H  . piperacillin-tazobactam (ZOSYN)  IV  2.25 g Intravenous Q6H  . sertraline  100 mg Oral Daily  . Travoprost (BAK Free)  1 drop Both Eyes QHS  . vitamin C  1,000 mg Oral Daily  . DISCONTD: furosemide  20 mg Oral Daily  . DISCONTD: oxyCODONE  10 mg Oral Q12H  . DISCONTD: piperacillin-tazobactam (ZOSYN)  IV  3.375 g Intravenous Q8H  . DISCONTD: vancomycin  1,000 mg Intravenous Q24H   Continuous Infusions:   . sodium chloride 50 mL/hr at 05/29/12 1456      Tarnisha Kachmar, DO  [facility] Pager [phone]  If 7PM-7AM, please contact night-coverage [email or web address] Password [email or web address] 05/29/2012, 6:05 PM   LOS: 8 days

## 2012-05-29 NOTE — Progress Notes (Addendum)
No complaints  Filed Vitals:   05/28/12 1815 05/28/12 2106 05/29/12 0223 05/29/12 0601  BP: 116/54 111/51 133/54 125/55  Pulse: 70 72 69 70  Temp: 97.4 F (36.3 C) 97.6 F (36.4 C) 98.5 F (36.9 C) 99.6 F (37.6 C)  TempSrc: Oral Oral Oral Oral  Resp: 18 18 16 14   Height:      Weight:      SpO2: 99% 95% 99% 97%   Left foot wound clean, no purulent drainage, some blood oozing from skin edge  Cr increased from 1.2 to 2.1 over the last few days  Continue antibiotics, cultures pending Plan for left fem pop bypass next week Renal dysfunction concerning most likely ATN or multifactorial will continue to follow Wound care left foot normal saline wet to dry q shift  Fabienne Bruns, MD [facility] [place] Office: [phone] Pager: [phone]

## 2012-05-29 NOTE — Progress Notes (Signed)
ANTIBIOTIC CONSULT NOTE - Follow Up  Pharmacy Consult for vancomycin and zosyn  Indication: cellulitis, osteomyelitis s/p transmetatarsal amputation of toes 3,4,5 of left foot Labs:  Basename 05/29/12 0540 05/28/12 0500 05/27/12 0500  WBC 14.1* 16.6* 14.9*  HGB 9.1* 9.5* 9.4*  PLT 341 330 311  LABCREA -- -- --  CREATININE 2.10* 1.63* 1.19*   Estimated Creatinine Clearance: 21.5 ml/min (by C-G formula based on Cr of 2.1).  Microbiology: Recent Results (from the past 720 hour(s))  URINE CULTURE     Status: Normal   Collection Time   05/15/12  3:10 PM      Component Value Range Status Comment   Specimen Description URINE, CLEAN CATCH   Final    Special Requests NONE   Final    Culture  Setup Time 05/15/2012 20:48   Final    Colony Count 5,000 COLONIES/ML   Final    Culture INSIGNIFICANT GROWTH   Final    Report Status 05/16/2012 FINAL   Final   CULTURE, BLOOD (ROUTINE X 2)     Status: Normal   Collection Time   05/15/12  4:46 PM      Component Value Range Status Comment   Specimen Description BLOOD LEFT ARM   Final    Special Requests     Final    Value: BOTTLES DRAWN AEROBIC AND ANAEROBIC 8CC EACH BOTTLE   Culture NO GROWTH 5 DAYS   Final    Report Status 05/20/2012 FINAL   Final   CULTURE, BLOOD (ROUTINE X 2)     Status: Normal   Collection Time   05/15/12  4:51 PM      Component Value Range Status Comment   Specimen Description BLOOD LEFT HAND   Final    Special Requests     Final    Value: BOTTLES DRAWN AEROBIC AND ANAEROBIC 8CC EACH BOTTLE   Culture NO GROWTH 5 DAYS   Final    Report Status 05/20/2012 FINAL   Final   MRSA PCR SCREENING     Status: Abnormal   Collection Time   05/15/12 10:20 PM      Component Value Range Status Comment   MRSA by PCR POSITIVE (*) NEGATIVE Final   CULTURE, BLOOD (ROUTINE X 2)     Status: Normal (Preliminary result)   Collection Time   05/23/12  8:00 PM      Component Value Range Status Comment   Specimen Description BLOOD LEFT HAND   Final     Special Requests BOTTLES DRAWN AEROBIC ONLY 10CC   Final    Culture  Setup Time 05/24/2012 14:17   Final    Culture     Final    Value:        BLOOD CULTURE RECEIVED NO GROWTH TO DATE CULTURE WILL BE HELD FOR 5 DAYS BEFORE ISSUING A FINAL NEGATIVE REPORT   Report Status PENDING   Incomplete   CULTURE, BLOOD (ROUTINE X 2)     Status: Normal (Preliminary result)   Collection Time   05/23/12  8:14 PM      Component Value Range Status Comment   Specimen Description BLOOD RIGHT HAND   Final    Special Requests BOTTLES DRAWN AEROBIC ONLY 10CC   Final    Culture  Setup Time 05/24/2012 14:18   Final    Culture     Final    Value:        BLOOD CULTURE RECEIVED NO GROWTH TO DATE CULTURE WILL  BE HELD FOR 5 DAYS BEFORE ISSUING A FINAL NEGATIVE REPORT   Report Status PENDING   Incomplete   CULTURE, ROUTINE-ABSCESS     Status: Normal (Preliminary result)   Collection Time   05/28/12 10:58 AM      Component Value Range Status Comment   Specimen Description ABSCESS FOOT LEFT   Final    Special Requests NONE   Final    Gram Stain PENDING   Incomplete    Culture Culture reincubated for better growth   Final    Report Status PENDING   Incomplete    Assessment: 78yo female was discharged from [facility] 9/12 had been on vanc for cellulitis and improved, sent home on PO ABX, now admitted to [facility] and to resume vancomycin and start zosyn for cellulitis / possible osteomyelitis; of note pt has h/o MRSA infection. Serum creatinine is up to 2.1 today.  Urine output 0.3 mL/kg/hr overnight.  Furosemide dose reduced yesterday.  Also received radio contrast dye for CT.  Goal of Therapy:  Vancomycin trough 15-20 mcg/mL  Plan:  1. Hold further vancomycin doses and check a random vancomycin level 9/22 in the morning. 2. Reduce Zosyn to 2.25g IV q6h  Celedonio Miyamoto, PharmD, BCPS Clinical Pharmacist Pager [phone] 05/29/2012

## 2012-05-30 ENCOUNTER — Encounter (HOSPITAL_COMMUNITY): Payer: Medicare Other

## 2012-05-30 DIAGNOSIS — L0390 Cellulitis, unspecified: Secondary | ICD-10-CM

## 2012-05-30 LAB — GLUCOSE, CAPILLARY
Glucose-Capillary: 172 mg/dL — ABNORMAL HIGH (ref 70–99)
Glucose-Capillary: 197 mg/dL — ABNORMAL HIGH (ref 70–99)
Glucose-Capillary: 155 mg/dL — ABNORMAL HIGH (ref 70–99)

## 2012-05-30 LAB — CBC
HCT: 28.2 % — ABNORMAL LOW (ref 36.0–46.0)
Hemoglobin: 8.9 g/dL — ABNORMAL LOW (ref 12.0–15.0)
MCHC: 31.6 g/dL (ref 30.0–36.0)

## 2012-05-30 LAB — BASIC METABOLIC PANEL
BUN: 29 mg/dL — ABNORMAL HIGH (ref 6–23)
GFR calc Af Amer: 17 mL/min — ABNORMAL LOW (ref >90)
GFR calc non Af Amer: 15 mL/min — ABNORMAL LOW (ref >90)
Potassium: 4.5 mEq/L (ref 3.5–5.1)
Sodium: 134 mEq/L — ABNORMAL LOW (ref 135–145)

## 2012-05-30 LAB — CULTURE, BLOOD (ROUTINE X 2)
Culture: NO GROWTH
Culture: NO GROWTH

## 2012-05-30 MED ORDER — SENNA 8.6 MG PO TABS
2.0000 | ORAL_TABLET | Freq: Every day | ORAL | Status: DC
  Administered 2012-05-30 – 2012-06-07 (×8): 17.2 mg via ORAL
  Filled 2012-05-30: qty 1
  Filled 2012-05-30 (×10): qty 2

## 2012-05-30 MED ORDER — DOCUSATE SODIUM 100 MG PO CAPS
100.0000 mg | ORAL_CAPSULE | Freq: Two times a day (BID) | ORAL | Status: DC
  Administered 2012-05-30 – 2012-06-08 (×17): 100 mg via ORAL
  Filled 2012-05-30 (×17): qty 1

## 2012-05-30 MED ORDER — FUROSEMIDE 10 MG/ML IJ SOLN
120.0000 mg | Freq: Once | INTRAMUSCULAR | Status: AC
  Administered 2012-05-30: 120 mg via INTRAVENOUS
  Filled 2012-05-30: qty 12

## 2012-05-30 MED ORDER — SODIUM CHLORIDE 0.9 % IJ SOLN
10.0000 mL | INTRAMUSCULAR | Status: DC | PRN
  Administered 2012-05-30 – 2012-06-07 (×13): 10 mL

## 2012-05-30 NOTE — Progress Notes (Signed)
[facility] PROGRESS NOTE  APIFFANY HURLBUTT WUJ:[id number] DOB: [birth year] DOA: 05/21/2012 PCP: [name], MD  Assessment/Plan: Acute on chronic CKD stage III  -Agree with high-dose intravenous furosemide -Consult nephrology if creatinine continues to worsen -Creatinine worsen partly due to diuresis and IV contrast from CT foot (9/19)  -Patient has been on vancomycin which may play a role but drug levels have actually been subtherapeutic until today (22) -Waiting for urine for eosinophils although less likely to be interstitial nephritis from beta-lactam  Diabetic foot infection/abscess  -MRI not possible due to the patient's pacemaker  -suspect underlying osteomyelitis due to her surgical findings on September 20  -nuclear medicine WBC tag scan  -CT of the foot reveals abscess (9/19)  -Appreciate Dr. Darrick Penna, s/p I&D on 9/20  -Followup surgical cultures and adjust antibiotics accordingly--remain pending -Continue continue Zosyn  Severe peripheral vascular disease  - revascularization after infection controlled  Diastolic CHF  -Appreciate cardiology assistance, agree with high-dose intravenous furosemide -Continue strict I/Os, furosemide  -positive fluid balance for the hospitalization  -Saline lock IV fluids Diabetes mellitus type 2  -CBGs have been fairly controlled -Patient continues to have poor by mouth intake  -Unfortunately, none of her CBGs were recorded today  -continue current regimen and monitor CBGs  Hypothyroidism  -Increased Synthroid to 88 mcg daily  Atrial fib  -does not appear to be good warfarin candidate due to falls, poor insight  -ASA after surgeries  Severe protein calorie malnutrition  -Pre-albumin 4.0  -Start beneprotein  -Glucerna supplement  Family Communication: family at beside  Disposition Plan: SNF when medically stable  Antibiotics:  Vancomycin September 14>>> September 21 Zosyn September 14>>>      Family Communication:   Pt at  beside Disposition Plan:   Home when medically stable     Procedures/Studies: Dg Chest 2 View  05/15/2012  *RADIOLOGY REPORT*  Clinical Data: Fever.  CHEST - 2 VIEW  Comparison: 01/24/2012.  Findings: The pacer wires are stable.  The cardiac silhouette, mediastinal and hilar contours are mildly prominent but unchanged. There is persistent central vascular congestion and possible mild interstitial edema.  No pleural effusion or focal infiltrate.  The bony thorax is intact.  IMPRESSION: Stable cardiac enlargement and central vascular congestion with possible mild interstitial edema.   Original Report Authenticated By: P. Loralie Champagne, M.D.    Ct Tibia Fibula Left W Contrast  05/27/2012  *RADIOLOGY REPORT*  Clinical Data:  Diabetic with foot pain, erythema and swelling. Possible abscess.  CT OF THE LEFT LOWER LEG WITH CONTRAST  Technique:  Multidetector CT imaging of the left lower leg was performed according to the standard protocol following intravenous contrast administration. Multiplanar CT image reconstructions were also generated.  Contrast:  80 ml Omnipaque-300  Comparison:  Foot radiographs 05/15/2012 and today.  Findings:  Imaging was performed from the proximal left lower leg through the left foot as specifically requested.  There is an ill-defined peripherally enhancing fluid collection within the fourth webspace of the foot.  This contains a small air bubble and measures up to 2.1 cm in greatest dimension. Fluid extends distally into the fourth toe.  There is some additional soft tissue emphysema tracking medially within the plantar forefoot within the plantar aspects of the second, third and fourth toes. No other measurable fluid collections are seen.  There is subcutaneous edema throughout the foot to extending into the distal lower leg.  Superficial varicosities are noted within the lower leg.  Bone windows demonstrate no cortical destruction  or significant arthropathic changes.  IMPRESSION:   1.  There is an ill-defined fluid collection within the fourth webspace with distal extension into the fourth toe and along the plantar aspects of the second and third toes.  This collection demonstrates peripheral enhancement and air bubbles and is consistent with an abscess. 2.  No CT evidence of osteomyelitis. 3.  Nonspecific subcutaneous edema in the distal lower leg and plantar foot.   Original Report Authenticated By: [name], M.D.    Dg Chest Port 1 View  05/29/2012  *RADIOLOGY REPORT*  Clinical Data: Evaluate for congestive heart failure.  PORTABLE CHEST - 1 VIEW  Comparison: Chest x-ray 05/25/2012.  Findings: Lung volumes are low.  No consolidative airspace disease. Probable trace bilateral pleural effusions.  Pulmonary venous congestion without frank pulmonary edema.  Mild cardiomegaly is unchanged. The patient is rotated to the left on today's exam, resulting in distortion of the mediastinal contours and reduced diagnostic sensitivity and specificity for mediastinal pathology. Atherosclerosis of the thoracic aorta. There is a right upper extremity PICC with tip terminating in the distal superior vena cava. Left-sided pacemaker device in place with lead tips projecting over the expected location of the right atrium and right ventricular apex.  IMPRESSION: 1.  Mild cardiomegaly with pulmonary venous congestion, but no frank pulmonary edema. 2.  Trace bilateral pleural effusions. 3.  Atherosclerosis. 4.  Support apparatus, as above.   Original Report Authenticated By: Florencia Reasons, M.D.    Dg Chest Port 1 View  05/25/2012  *RADIOLOGY REPORT*  Clinical Data: PICC line placement.  PORTABLE CHEST - 1 VIEW  Comparison: 05/21/2012  Findings: Right-sided PICC line noted with tip projecting over the SVC.  No pneumothorax or complicating feature.  Dual lead pacer noted.  There is mild cardiomegaly along with stable interstitial accentuation.  Peripheral airspace opacity in the right mid lung  appears slightly less striking.  Atherosclerotic calcification in the aortic arch noted.  IMPRESSION:  1.  Right PICC line tip:  SVC.  No pneumothorax or complicating feature. 2.  Cardiomegaly with mild interstitial accentuation, suspicious for mild interstitial edema. 3.  Faint airspace disease peripherally in the right mid lung appears improved.   Original Report Authenticated By: [name], M.D.    Dg Foot 2 Views Left  05/27/2012  *RADIOLOGY REPORT*  Clinical Data: Diabetic foot infection  LEFT FOOT - 2 VIEW  Comparison: 05/15/2012  Findings: Scattered soft tissue swelling. Diffuse osseous demineralization. Foci of soft tissue gas identified between the bases of the fourth and fifth toes. Joint spaces preserved. Hallux valgus. No acute fracture, dislocation or bone destruction.  IMPRESSION: Soft tissue gas between the bases of the fourth and fifth toes consistent with soft tissue infection. No definite evidence of osteomyelitis. If further soft tissue assessment needed or persistent clinical concern for occult osteomyelitis remains, consider MR imaging of the left foot.   Original Report Authenticated By: [name], M.D.    Dg Foot Complete Left  05/15/2012  *RADIOLOGY REPORT*  Clinical Data: Diabetic foot ulcers.  LEFT FOOT - COMPLETE 3+ VIEW  Comparison: None  Findings: The joint spaces are maintained.  No acute bony findings or destructive bony changes.  IMPRESSION: No plain film findings for osteomyelitis.   Original Report Authenticated By: P. Loralie Champagne, M.D.          Subjective: Patient complains of some shortness of breath. She denies any fevers, chills, chest pain, nausea, vomiting, diarrhea. No abdominal pain, rashes, dizziness.  Objective: Filed Vitals:   05/29/12 1843 05/29/12 2134 05/30/12 0232 05/30/12 0646  BP: 137/51 122/62 116/53 120/52  Pulse: 71 70 70 70  Temp: 99.5 F (37.5 C) 98.4 F (36.9 C) 98.9 F (37.2 C) 98.8 F (37.1 C)  TempSrc: Oral Oral     Resp: 18 17 16 17   Height:      Weight:    79.5 kg (175 lb 4.3 oz)  SpO2: 100% 98% 94% 98%    Intake/Output Summary (Last 24 hours) at 05/30/12 1651 Last data filed at 05/30/12 1600  Gross per 24 hour  Intake 1762.01 ml  Output   1000 ml  Net 762.01 ml   Weight change:  Exam:   General:  Pt is alert, follows commands appropriately, not in acute distress  HEENT: No icterus, No thrush, No neck mass, [place]/AT  Cardiovascular: RRR, S1/S2, no rubs, no gallops  Respiratory: Bibasilar crackles. No wheezes or rhonchi. Good air movement.  Abdomen: Soft/+BS, non tender, non distended, no guarding  Extremities: Left foot wound without any necrosis, pus, odor, crepitance. Metatarsal heads exposed. No active bleeding.  Data Reviewed: Basic Metabolic Panel:  Lab 05/30/12 7846 05/29/12 0540 05/28/12 0500 05/27/12 0500 05/26/12 1130  NA 134* 132* 138 135 134*  K 4.5 4.5 4.4 3.5 4.1  CL 96 95* 101 99 100  CO2 30 31 31 31 27   GLUCOSE 186* 206* 191* 125* 297*  BUN 29* 23 20 18 17   CREATININE 2.88* 2.10* 1.63* 1.19* 1.19*  CALCIUM 9.0 8.8 8.9 8.8 8.8  MG -- -- -- -- --  PHOS -- -- -- -- --   Liver Function Tests: No results found for this basename: AST:5,ALT:5,ALKPHOS:5,BILITOT:5,PROT:5,ALBUMIN:5 in the last 168 hours No results found for this basename: LIPASE:5,AMYLASE:5 in the last 168 hours No results found for this basename: AMMONIA:5 in the last 168 hours CBC:  Lab 05/30/12 0545 05/29/12 0540 05/28/12 0500 05/27/12 0500 05/26/12 0500  WBC 11.4* 14.1* 16.6* 14.9* 14.7*  NEUTROABS -- -- -- -- --  HGB 8.9* 9.1* 9.5* 9.4* 9.8*  HCT 28.2* 29.3* 30.3* 29.1* 29.9*  MCV 91.0 91.0 90.7 89.5 90.3  PLT 354 341 330 311 323   Cardiac Enzymes:  Lab 05/24/12 0807 05/24/12 0037 05/23/12 2000  CKTOTAL -- -- --  CKMB -- -- --  CKMBINDEX -- -- --  TROPONINI <0.30 <0.30 <0.30   BNP: No components found with this basename: POCBNP:5 CBG:  Lab 05/29/12 2206 05/29/12 1747 05/29/12 1258  05/29/12 0753 05/28/12 2209  GLUCAP 149* 113* 150* 198* 178*    Recent Results (from the past 240 hour(s))  CULTURE, BLOOD (ROUTINE X 2)     Status: Normal   Collection Time   05/23/12  8:00 PM      Component Value Range Status Comment   Specimen Description BLOOD LEFT HAND   Final    Special Requests BOTTLES DRAWN AEROBIC ONLY 10CC   Final    Culture  Setup Time 05/24/2012 14:17   Final    Culture NO GROWTH 5 DAYS   Final    Report Status 05/30/2012 FINAL   Final   CULTURE, BLOOD (ROUTINE X 2)     Status: Normal   Collection Time   05/23/12  8:14 PM      Component Value Range Status Comment   Specimen Description BLOOD RIGHT HAND   Final    Special Requests BOTTLES DRAWN AEROBIC ONLY 10CC   Final    Culture  Setup Time 05/24/2012 14:18  Final    Culture NO GROWTH 5 DAYS   Final    Report Status 05/30/2012 FINAL   Final   CULTURE, ROUTINE-ABSCESS     Status: Normal (Preliminary result)   Collection Time   05/28/12 10:58 AM      Component Value Range Status Comment   Specimen Description ABSCESS FOOT LEFT   Final    Special Requests NONE   Final    Gram Stain PENDING   Incomplete    Culture Culture reincubated for better growth   Final    Report Status PENDING   Incomplete      Scheduled Meds:   . enoxaparin (LOVENOX) injection  30 mg Subcutaneous Q24H  . feeding supplement  237 mL Oral TID BM  . furosemide  120 mg Intravenous Once  . insulin aspart  0-5 Units Subcutaneous QHS  . insulin aspart  0-9 Units Subcutaneous TID WC  . insulin glargine  11 Units Subcutaneous QHS  . levothyroxine  88 mcg Oral QAC breakfast  . oxyCODONE  10 mg Oral Q12H  . oxyCODONE  10 mg Oral Once  . piperacillin-tazobactam (ZOSYN)  IV  2.25 g Intravenous Q6H  . protein supplement  1 scoop Oral TID WC  . sertraline  100 mg Oral Daily  . Travoprost (BAK Free)  1 drop Both Eyes QHS  . vitamin C  1,000 mg Oral Daily   Continuous Infusions:   . sodium chloride 50 mL/hr at 05/29/12 1456      Adham Johnson, DO  [facility] Pager [phone]  If 7PM-7AM, please contact night-coverage [email or web address] Password [facility] 05/30/2012, 4:51 PM   LOS: 9 days

## 2012-05-30 NOTE — Progress Notes (Signed)
Patient ID: Brittney [name], female   DOB: 09-19-1931, 78 y.o.   MRN: [id number]   SUBJECTIVE:  The patient is stable in bed. She is not complaining of chest discomfort at this time. She does not have any significant shortness of breath. Yesterday I was not sure about her volume status. I did start 50 cc of saline per hour. This is not recorded in the I&O flowsheets. She is receiving the fluid. Therefore she has received at least 1 L of IV fluid. In addition she has had fluid with her antibiotic boluses.Brittney [name] spoken with the nursing team so that we corrected documentation of her fluids. Overall this shows that her I&O has been positive since yesterday. Her chest x-ray shows slight pleural effusions and some vascular congestion. Creatinine is up to 2.8.   Filed Vitals:   05/29/12 1843 05/29/12 2134 05/30/12 0232 05/30/12 0646  BP: 137/51 122/62 116/53 120/52  Pulse: 71 70 70 70  Temp: 99.5 F (37.5 C) 98.4 F (36.9 C) 98.9 F (37.2 C) 98.8 F (37.1 C)  TempSrc: Oral Oral    Resp: 18 17 16 17   Height:      Weight:    175 lb 4.3 oz (79.5 kg)  SpO2: 100% 98% 94% 98%    Intake/Output Summary (Last 24 hours) at 05/30/12 1023 Last data filed at 05/30/12 0650  Gross per 24 hour  Intake    240 ml  Output    850 ml  Net   -610 ml    LABS: Basic Metabolic Panel:  Basename 05/30/12 0545 05/29/12 0540  NA 134* 132*  K 4.5 4.5  CL 96 95*  CO2 30 31  GLUCOSE 186* 206*  BUN 29* 23  CREATININE 2.88* 2.10*  CALCIUM 9.0 8.8  MG -- --  PHOS -- --   Liver Function Tests: No results found for this basename: AST:2,ALT:2,ALKPHOS:2,BILITOT:2,PROT:2,ALBUMIN:2 in the last 72 hours No results found for this basename: LIPASE:2,AMYLASE:2 in the last 72 hours CBC:  Basename 05/30/12 0545 05/29/12 0540  WBC 11.4* 14.1*  NEUTROABS -- --  HGB 8.9* 9.1*  HCT 28.2* 29.3*  MCV 91.0 91.0  PLT 354 341   Cardiac Enzymes: No results found for this basename: CKTOTAL:3,CKMB:3,CKMBINDEX:3,TROPONINI:3 in  the last 72 hours BNP: No components found with this basename: POCBNP:3 D-Dimer: No results found for this basename: DDIMER:2 in the last 72 hours Hemoglobin A1C: No results found for this basename: HGBA1C in the last 72 hours Fasting Lipid Panel: No results found for this basename: CHOL,HDL,LDLCALC,TRIG,CHOLHDL,LDLDIRECT in the last 72 hours Thyroid Function Tests: No results found for this basename: TSH,T4TOTAL,FREET3,T3FREE,THYROIDAB in the last 72 hours  RADIOLOGY: Dg Chest 2 View  05/15/2012  *RADIOLOGY REPORT*  Clinical Data: Fever.  CHEST - 2 VIEW  Comparison: 01/24/2012.  Findings: The pacer wires are stable.  The cardiac silhouette, mediastinal and hilar contours are mildly prominent but unchanged. There is persistent central vascular congestion and possible mild interstitial edema.  No pleural effusion or focal infiltrate.  The bony thorax is intact.  IMPRESSION: Stable cardiac enlargement and central vascular congestion with possible mild interstitial edema.   Original Report Authenticated By: P. Brittney [name], M.D.    Ct Tibia Fibula Left W Contrast  05/27/2012  *RADIOLOGY REPORT*  Clinical Data:  Diabetic with foot pain, erythema and swelling. Possible abscess.  CT OF THE LEFT LOWER LEG WITH CONTRAST  Technique:  Multidetector CT imaging of the left lower leg was performed according to the standard protocol following  intravenous contrast administration. Multiplanar CT image reconstructions were also generated.  Contrast:  80 ml Omnipaque-300  Comparison:  Foot radiographs 05/15/2012 and today.  Findings:  Imaging was performed from the proximal left lower leg through the left foot as specifically requested.  There is an ill-defined peripherally enhancing fluid collection within the fourth webspace of the foot.  This contains a small air bubble and measures up to 2.1 cm in greatest dimension. Fluid extends distally into the fourth toe.  There is some additional soft tissue emphysema  tracking medially within the plantar forefoot within the plantar aspects of the second, third and fourth toes. No other measurable fluid collections are seen.  There is subcutaneous edema throughout the foot to extending into the distal lower leg.  Superficial varicosities are noted within the lower leg.  Bone windows demonstrate no cortical destruction or significant arthropathic changes.  IMPRESSION:  1.  There is an ill-defined fluid collection within the fourth webspace with distal extension into the fourth toe and along the plantar aspects of the second and third toes.  This collection demonstrates peripheral enhancement and air bubbles and is consistent with an abscess. 2.  No CT evidence of osteomyelitis. 3.  Nonspecific subcutaneous edema in the distal lower leg and plantar foot.   Original Report Authenticated By: Brittney [name], M.D.    Dg Chest Port 1 View  05/29/2012  *RADIOLOGY REPORT*  Clinical Data: Evaluate for congestive heart failure.  PORTABLE CHEST - 1 VIEW  Comparison: Chest x-ray 05/25/2012.  Findings: Lung volumes are low.  No consolidative airspace disease. Probable trace bilateral pleural effusions.  Pulmonary venous congestion without frank pulmonary edema.  Mild cardiomegaly is unchanged. The patient is rotated to the left on today's exam, resulting in distortion of the mediastinal contours and reduced diagnostic sensitivity and specificity for mediastinal pathology. Atherosclerosis of the thoracic aorta. There is a right upper extremity PICC with tip terminating in the distal superior vena cava. Left-sided pacemaker device in place with lead tips projecting over the expected location of the right atrium and right ventricular apex.  IMPRESSION: 1.  Mild cardiomegaly with pulmonary venous congestion, but no frank pulmonary edema. 2.  Trace bilateral pleural effusions. 3.  Atherosclerosis. 4.  Support apparatus, as above.   Original Report Authenticated By: Brittney [name], M.D.     Dg Chest Port 1 View  05/25/2012  *RADIOLOGY REPORT*  Clinical Data: PICC line placement.  PORTABLE CHEST - 1 VIEW  Comparison: 05/21/2012  Findings: Right-sided PICC line noted with tip projecting over the SVC.  No pneumothorax or complicating feature.  Dual lead pacer noted.  There is mild cardiomegaly along with stable interstitial accentuation.  Peripheral airspace opacity in the right mid lung appears slightly less striking.  Atherosclerotic calcification in the aortic arch noted.  IMPRESSION:  1.  Right PICC line tip:  SVC.  No pneumothorax or complicating feature. 2.  Cardiomegaly with mild interstitial accentuation, suspicious for mild interstitial edema. 3.  Faint airspace disease peripherally in the right mid lung appears improved.   Original Report Authenticated By: [name], M.D.    Dg Foot 2 Views Left  05/27/2012  *RADIOLOGY REPORT*  Clinical Data: Diabetic foot infection  LEFT FOOT - 2 VIEW  Comparison: 05/15/2012  Findings: Scattered soft tissue swelling. Diffuse osseous demineralization. Foci of soft tissue gas identified between the bases of the fourth and fifth toes. Joint spaces preserved. Hallux valgus. No acute fracture, dislocation or bone destruction.  IMPRESSION: Soft tissue  gas between the bases of the fourth and fifth toes consistent with soft tissue infection. No definite evidence of osteomyelitis. If further soft tissue assessment needed or persistent clinical concern for occult osteomyelitis remains, consider MR imaging of the left foot.   Original Report Authenticated By: [name], M.D.    Dg Foot Complete Left  05/15/2012  *RADIOLOGY REPORT*  Clinical Data: Diabetic foot ulcers.  LEFT FOOT - COMPLETE 3+ VIEW  Comparison: None  Findings: The joint spaces are maintained.  No acute bony findings or destructive bony changes.  IMPRESSION: No plain film findings for osteomyelitis.   Original Report Authenticated By: P. Brittney [name], M.D.     PHYSICAL EXAM   Patient is oriented to person time and place. Affect is normal. She has bilateral rales. There is no respiratory distress. Cardiac exam reveals S1 and S2. The abdomen is soft. She does have trace edema in her right leg.     ASSESSMENT AND PLAN:   *Osteomyelitis    This is being treated by the surgical teams   Pacemaker Pacemaker is in place. For this reason she cannot have MRI    PAD (peripheral artery disease) I am hoping that her overall medical status can be stabilized so that she can have been needed vascular surgery.   Acute renal insufficiency   I am very concerned about her renal status. Her creatinine is up to 2.8. My overall assessment is that this probably is not related to overdiuresis. I do not believe that she is dried today. She may have an episode of ATN related to some of her surgery and her antibiotics. I've chosen to give her 120 mg of IV Lasix to see how she responds. If her creatinine continues to worsen I would suggest considering nephrology consultation to help.    Acute on chronic diastolic CHF (congestive heart failure)   When I saw her as the week went on last week I thought she was mildly volume overloaded. She did diaries some. However more recently her I&O has been positive. Her creatinine continues to climb. I feel that overall she is probably mildly volume overloaded at this time.    Willa Rough 05/30/2012 10:23 AM

## 2012-05-31 ENCOUNTER — Encounter (HOSPITAL_COMMUNITY): Payer: Self-pay | Admitting: Anesthesiology

## 2012-05-31 ENCOUNTER — Inpatient Hospital Stay (HOSPITAL_COMMUNITY): Payer: Medicare Other

## 2012-05-31 LAB — URINALYSIS, ROUTINE W REFLEX MICROSCOPIC
Nitrite: NEGATIVE
Specific Gravity, Urine: 1.016 (ref 1.005–1.030)
Urobilinogen, UA: 0.2 mg/dL (ref 0.0–1.0)

## 2012-05-31 LAB — SODIUM, URINE, RANDOM: Sodium, Ur: 36 mEq/L

## 2012-05-31 LAB — CBC
MCV: 90.9 fL (ref 78.0–100.0)
Platelets: 332 10*3/uL (ref 150–400)
RBC: 3.07 MIL/uL — ABNORMAL LOW (ref 3.87–5.11)
WBC: 10.9 10*3/uL — ABNORMAL HIGH (ref 4.0–10.5)

## 2012-05-31 LAB — PHOSPHORUS: Phosphorus: 4.7 mg/dL — ABNORMAL HIGH (ref 2.3–4.6)

## 2012-05-31 LAB — URINE MICROSCOPIC-ADD ON

## 2012-05-31 LAB — RETICULOCYTES
RBC.: 3.08 MIL/uL — ABNORMAL LOW (ref 3.87–5.11)
Retic Ct Pct: 2 % (ref 0.4–3.1)

## 2012-05-31 LAB — GLUCOSE, CAPILLARY

## 2012-05-31 LAB — BASIC METABOLIC PANEL
CO2: 31 mEq/L (ref 19–32)
Calcium: 9 mg/dL (ref 8.4–10.5)
Sodium: 133 mEq/L — ABNORMAL LOW (ref 135–145)

## 2012-05-31 LAB — CREATININE, URINE, RANDOM: Creatinine, Urine: 82.23 mg/dL

## 2012-05-31 LAB — VITAMIN B12: Vitamin B-12: 1384 pg/mL — ABNORMAL HIGH (ref 211–911)

## 2012-05-31 MED ORDER — INSULIN GLARGINE 100 UNIT/ML ~~LOC~~ SOLN
11.0000 [IU] | Freq: Every day | SUBCUTANEOUS | Status: DC
  Administered 2012-05-31 – 2012-06-07 (×7): 11 [IU] via SUBCUTANEOUS

## 2012-05-31 NOTE — Progress Notes (Signed)
[facility] PROGRESS NOTE  Brittney ESTENSON ZOX:[id number] DOB: [birth year] DOA: 05/21/2012 PCP: [name], MD  Assessment/Plan:  Acute on chronic CKD stage III  -Appreciate nephrology input -Renal ultrasound (9/23) negative for hydronephrosis, consistent with medical renal disease -Creatinine worsen partly due to diuresis and IV contrast from CT foot (9/19)  -Patient has been on vancomycin which may play a role but drug levels have actually been subtherapeutic until today (22)  -Waiting for urine for eosinophils although less likely to be interstitial nephritis from beta-lactam  -Likely a component of cardiorenal syndrome Diabetic foot infection/abscess  -MRI not possible due to the patient's pacemaker  -suspect underlying osteomyelitis due to her surgical findings on September 20  -CT of the foot reveals abscess (9/19)  -Appreciate Dr. Darrick Penna, s/p I&D on 9/20  -Followup surgical cultures and adjust antibiotics accordingly--remain pending  -Continue continue Zosyn await final wound culture from surgery -Recheck vancomycin level in the morning -Culture shows staph aureus--if MRSA, patient may need daptomycin versus linezolid Severe peripheral vascular disease  - revascularization after infection controlled  Diastolic CHF  -Appreciate cardiology assistance -Continue strict I/Os -positive fluid balance for the hospitalization  -Saline lock IV fluids  Diabetes mellitus type 2  -CBGs have been fairly controlled  -Patient continues to have poor by mouth intake  -Unfortunately, none of her CBGs were recorded today  -continue current regimen and monitor CBGs; with acute renal failure, prolonged half-life of insulin Hypothyroidism  -Increased Synthroid to 88 mcg daily  Atrial fib  -does not appear to be good warfarin candidate due to falls, poor insight  -ASA after surgeries  Severe protein calorie malnutrition  -Pre-albumin 4.0  -beneprotein has been started -Glucerna  supplement has been started Anemia of CKD -Await iron studies  Family Communication: family at beside  Disposition Plan: SNF when medically stable  Antibiotics:  Vancomycin September 14>>> September 21  Zosyn September 14>>>     Family Communication:   Pt at beside Disposition Plan:   Home when medically stable       Procedures/Studies: Dg Chest 2 View  05/15/2012  *RADIOLOGY REPORT*  Clinical Data: Fever.  CHEST - 2 VIEW  Comparison: 01/24/2012.  Findings: The pacer wires are stable.  The cardiac silhouette, mediastinal and hilar contours are mildly prominent but unchanged. There is persistent central vascular congestion and possible mild interstitial edema.  No pleural effusion or focal infiltrate.  The bony thorax is intact.  IMPRESSION: Stable cardiac enlargement and central vascular congestion with possible mild interstitial edema.   Original Report Authenticated By: P. Loralie Champagne, M.D.    Ct Tibia Fibula Left W Contrast  05/27/2012  *RADIOLOGY REPORT*  Clinical Data:  Diabetic with foot pain, erythema and swelling. Possible abscess.  CT OF THE LEFT LOWER LEG WITH CONTRAST  Technique:  Multidetector CT imaging of the left lower leg was performed according to the standard protocol following intravenous contrast administration. Multiplanar CT image reconstructions were also generated.  Contrast:  80 ml Omnipaque-300  Comparison:  Foot radiographs 05/15/2012 and today.  Findings:  Imaging was performed from the proximal left lower leg through the left foot as specifically requested.  There is an ill-defined peripherally enhancing fluid collection within the fourth webspace of the foot.  This contains a small air bubble and measures up to 2.1 cm in greatest dimension. Fluid extends distally into the fourth toe.  There is some additional soft tissue emphysema tracking medially within the plantar forefoot within the plantar aspects of the second, third  and fourth toes. No other measurable  fluid collections are seen.  There is subcutaneous edema throughout the foot to extending into the distal lower leg.  Superficial varicosities are noted within the lower leg.  Bone windows demonstrate no cortical destruction or significant arthropathic changes.  IMPRESSION:  1.  There is an ill-defined fluid collection within the fourth webspace with distal extension into the fourth toe and along the plantar aspects of the second and third toes.  This collection demonstrates peripheral enhancement and air bubbles and is consistent with an abscess. 2.  No CT evidence of osteomyelitis. 3.  Nonspecific subcutaneous edema in the distal lower leg and plantar foot.   Original Report Authenticated By: [name], M.D.    US Renal  05/31/2012  *RADIOLOGY REPORT*  Clinical Data: Acute on chronic renal failure  RENAL/URINARY TRACT ULTRASOUND COMPLETE  Comparison:  Abdominal ultrasound dated 11/15/2009  Findings:  Right Kidney:  Measures 10.2 cm.  Echogenic renal parenchyma, likely reflecting medical renal disease.  No mass or hydronephrosis.  Left Kidney:  Measures 9.9 cm.  Echogenic renal parenchyma, likely reflecting medical renal disease.  Bladder:  Decompressed by indwelling Foley catheter.  IMPRESSION: Echogenic renal parenchyma, likely reflecting medical renal disease.  No hydronephrosis.  Bladder decompressed by indwelling Foley catheter.   Original Report Authenticated By: Charline Bills, M.D.    Dg Chest Port 1 View  05/29/2012  *RADIOLOGY REPORT*  Clinical Data: Evaluate for congestive heart failure.  PORTABLE CHEST - 1 VIEW  Comparison: Chest x-ray 05/25/2012.  Findings: Lung volumes are low.  No consolidative airspace disease. Probable trace bilateral pleural effusions.  Pulmonary venous congestion without frank pulmonary edema.  Mild cardiomegaly is unchanged. The patient is rotated to the left on today's exam, resulting in distortion of the mediastinal contours and reduced diagnostic sensitivity  and specificity for mediastinal pathology. Atherosclerosis of the thoracic aorta. There is a right upper extremity PICC with tip terminating in the distal superior vena cava. Left-sided pacemaker device in place with lead tips projecting over the expected location of the right atrium and right ventricular apex.  IMPRESSION: 1.  Mild cardiomegaly with pulmonary venous congestion, but no frank pulmonary edema. 2.  Trace bilateral pleural effusions. 3.  Atherosclerosis. 4.  Support apparatus, as above.   Original Report Authenticated By: Florencia Reasons, M.D.    Dg Chest Port 1 View  05/25/2012  *RADIOLOGY REPORT*  Clinical Data: PICC line placement.  PORTABLE CHEST - 1 VIEW  Comparison: 05/21/2012  Findings: Right-sided PICC line noted with tip projecting over the SVC.  No pneumothorax or complicating feature.  Dual lead pacer noted.  There is mild cardiomegaly along with stable interstitial accentuation.  Peripheral airspace opacity in the right mid lung appears slightly less striking.  Atherosclerotic calcification in the aortic arch noted.  IMPRESSION:  1.  Right PICC line tip:  SVC.  No pneumothorax or complicating feature. 2.  Cardiomegaly with mild interstitial accentuation, suspicious for mild interstitial edema. 3.  Faint airspace disease peripherally in the right mid lung appears improved.   Original Report Authenticated By: [name], M.D.    Dg Foot 2 Views Left  05/27/2012  *RADIOLOGY REPORT*  Clinical Data: Diabetic foot infection  LEFT FOOT - 2 VIEW  Comparison: 05/15/2012  Findings: Scattered soft tissue swelling. Diffuse osseous demineralization. Foci of soft tissue gas identified between the bases of the fourth and fifth toes. Joint spaces preserved. Hallux valgus. No acute fracture, dislocation or bone destruction.  IMPRESSION:  Soft tissue gas between the bases of the fourth and fifth toes consistent with soft tissue infection. No definite evidence of osteomyelitis. If further  soft tissue assessment needed or persistent clinical concern for occult osteomyelitis remains, consider MR imaging of the left foot.   Original Report Authenticated By: [name], M.D.    Dg Foot Complete Left  05/15/2012  *RADIOLOGY REPORT*  Clinical Data: Diabetic foot ulcers.  LEFT FOOT - COMPLETE 3+ VIEW  Comparison: None  Findings: The joint spaces are maintained.  No acute bony findings or destructive bony changes.  IMPRESSION: No plain film findings for osteomyelitis.   Original Report Authenticated By: P. Loralie Champagne, M.D.          Subjective: Patient remains in good spirits. She denies any fevers, chills, chest pain, nausea, vomiting, diarrhea, abdominal pain, dysuria, rashes.  Objective: Filed Vitals:   05/30/12 1430 05/30/12 2202 05/31/12 0645 05/31/12 1328  BP: 109/51 137/52 116/62 110/48  Pulse: 68 70 70 70  Temp: 98.7 F (37.1 C) 98.5 F (36.9 C) 97.3 F (36.3 C) 98.6 F (37 C)  TempSrc: Oral Oral Axillary Oral  Resp: 18 19 18 18   Height:      Weight:   78.2 kg (172 lb 6.4 oz)   SpO2: 95% 98% 97% 94%    Intake/Output Summary (Last 24 hours) at 05/31/12 1935 Last data filed at 05/31/12 1446  Gross per 24 hour  Intake    620 ml  Output    950 ml  Net   -330 ml   Weight change: -1.3 kg (-2 lb 13.9 oz) Exam:   General:  Pt is alert, follows commands appropriately, not in acute distress  HEENT: No icterus, No thrush,  [place]/AT  Cardiovascular: RRR, S1/S2,   Respiratory: Bibasilar crackles. No wheezes or rhonchi.  Abdomen: Soft/+BS, non tender, non distended, no guarding  Extremities: 1+ edema, No lymphangitis, No petechiae, No rashes, no synovitis  Data Reviewed: Basic Metabolic Panel:  Lab 05/31/12 4098 05/31/12 0610 05/30/12 0545 05/29/12 0540 05/28/12 0500 05/27/12 0500  NA -- 133* 134* 132* 138 135  K -- 4.3 4.5 4.5 4.4 3.5  CL -- 93* 96 95* 101 99  CO2 -- 31 30 31 31 31   GLUCOSE -- 157* 186* 206* 191* 125*  BUN -- 34* 29* 23 20 18     CREATININE -- 3.43* 2.88* 2.10* 1.63* 1.19*  CALCIUM -- 9.0 9.0 8.8 8.9 8.8  MG -- -- -- -- -- --  PHOS 4.7* -- -- -- -- --   Liver Function Tests: No results found for this basename: AST:5,ALT:5,ALKPHOS:5,BILITOT:5,PROT:5,ALBUMIN:5 in the last 168 hours No results found for this basename: LIPASE:5,AMYLASE:5 in the last 168 hours No results found for this basename: AMMONIA:5 in the last 168 hours CBC:  Lab 05/31/12 0610 05/30/12 0545 05/29/12 0540 05/28/12 0500 05/27/12 0500  WBC 10.9* 11.4* 14.1* 16.6* 14.9*  NEUTROABS -- -- -- -- --  HGB 8.8* 8.9* 9.1* 9.5* 9.4*  HCT 27.9* 28.2* 29.3* 30.3* 29.1*  MCV 90.9 91.0 91.0 90.7 89.5  PLT 332 354 341 330 311   Cardiac Enzymes: No results found for this basename: CKTOTAL:5,CKMB:5,CKMBINDEX:5,TROPONINI:5 in the last 168 hours BNP: No components found with this basename: POCBNP:5 CBG:  Lab 05/31/12 1707 05/31/12 1208 05/31/12 0740 05/30/12 2210 05/30/12 1730  GLUCAP 142* 134* 151* 155* 151*    Recent Results (from the past 240 hour(s))  CULTURE, BLOOD (ROUTINE X 2)     Status: Normal   Collection  Time   05/23/12  8:00 PM      Component Value Range Status Comment   Specimen Description BLOOD LEFT HAND   Final    Special Requests BOTTLES DRAWN AEROBIC ONLY 10CC   Final    Culture  Setup Time 05/24/2012 14:17   Final    Culture NO GROWTH 5 DAYS   Final    Report Status 05/30/2012 FINAL   Final   CULTURE, BLOOD (ROUTINE X 2)     Status: Normal   Collection Time   05/23/12  8:14 PM      Component Value Range Status Comment   Specimen Description BLOOD RIGHT HAND   Final    Special Requests BOTTLES DRAWN AEROBIC ONLY 10CC   Final    Culture  Setup Time 05/24/2012 14:18   Final    Culture NO GROWTH 5 DAYS   Final    Report Status 05/30/2012 FINAL   Final   CULTURE, ROUTINE-ABSCESS     Status: Normal (Preliminary result)   Collection Time   05/28/12 10:58 AM      Component Value Range Status Comment   Specimen Description ABSCESS  FOOT LEFT   Final    Special Requests NONE   Final    Gram Stain     Final    Value: NO WBC SEEN     NO SQUAMOUS EPITHELIAL CELLS SEEN     NO ORGANISMS SEEN   Culture     Final    Value: FEW STAPHYLOCOCCUS AUREUS     Note: RIFAMPIN AND GENTAMICIN SHOULD NOT BE USED AS SINGLE DRUGS FOR TREATMENT OF STAPH INFECTIONS.   Report Status PENDING   Incomplete      Scheduled Meds:   . docusate sodium  100 mg Oral BID  . enoxaparin (LOVENOX) injection  30 mg Subcutaneous Q24H  . feeding supplement  237 mL Oral TID BM  . insulin aspart  0-5 Units Subcutaneous QHS  . insulin aspart  0-9 Units Subcutaneous TID WC  . insulin glargine  11 Units Subcutaneous QHS  . levothyroxine  88 mcg Oral QAC breakfast  . oxyCODONE  10 mg Oral Q12H  . piperacillin-tazobactam (ZOSYN)  IV  2.25 g Intravenous Q6H  . protein supplement  1 scoop Oral TID WC  . senna  2 tablet Oral QHS  . sertraline  100 mg Oral Daily  . Travoprost (BAK Free)  1 drop Both Eyes QHS  . vitamin C  1,000 mg Oral Daily  . DISCONTD: insulin glargine  11 Units Subcutaneous QHS   Continuous Infusions:    Brittney Billard, DO  [facility] Pager [phone]  If 7PM-7AM, please contact night-coverage [email or web address] Password [facility] 05/31/2012, 7:35 PM   LOS: 10 days

## 2012-05-31 NOTE — Progress Notes (Signed)
Patient ID: Brittney [name], female   DOB: 06/18/1932, 78 y.o.   MRN: [id number]   SUBJECTIVE:  The patient is resting comfortably in bed this morning. Yesterday I had ordered 120 mg of IV Lasix. She did have reasonable urine output. However her creatinine continues to climb. Is now up to 3.4. I suspect that she does have ATN that is nonoliguric. However I will have to leave the diagnosis to internal medicine and the nephrology team if they become involved.   Filed Vitals:   05/30/12 0646 05/30/12 1430 05/30/12 2202 05/31/12 0645  BP: 120/52 109/51 137/52 116/62  Pulse: 70 68 70 70  Temp: 98.8 F (37.1 C) 98.7 F (37.1 C) 98.5 F (36.9 C) 97.3 F (36.3 C)  TempSrc:  Oral Oral Axillary  Resp: 17 18 19 18   Height:      Weight: 175 lb 4.3 oz (79.5 kg)   172 lb 6.4 oz (78.2 kg)  SpO2: 98% 95% 98% 97%    Intake/Output Summary (Last 24 hours) at 05/31/12 0723 Last data filed at 05/31/12 0700  Gross per 24 hour  Intake 1148.67 ml  Output   1100 ml  Net  48.67 ml    LABS: Basic Metabolic Panel:  Basename 05/31/12 0610 05/30/12 0545  NA 133* 134*  K 4.3 4.5  CL 93* 96  CO2 31 30  GLUCOSE 157* 186*  BUN 34* 29*  CREATININE 3.43* 2.88*  CALCIUM 9.0 9.0  MG -- --  PHOS -- --   Liver Function Tests: No results found for this basename: AST:2,ALT:2,ALKPHOS:2,BILITOT:2,PROT:2,ALBUMIN:2 in the last 72 hours No results found for this basename: LIPASE:2,AMYLASE:2 in the last 72 hours CBC:  Basename 05/31/12 0610 05/30/12 0545  WBC 10.9* 11.4*  NEUTROABS -- --  HGB 8.8* 8.9*  HCT 27.9* 28.2*  MCV 90.9 91.0  PLT 332 354   Cardiac Enzymes: No results found for this basename: CKTOTAL:3,CKMB:3,CKMBINDEX:3,TROPONINI:3 in the last 72 hours BNP: No components found with this basename: POCBNP:3 D-Dimer: No results found for this basename: DDIMER:2 in the last 72 hours Hemoglobin A1C: No results found for this basename: HGBA1C in the last 72 hours Fasting Lipid Panel: No results  found for this basename: CHOL,HDL,LDLCALC,TRIG,CHOLHDL,LDLDIRECT in the last 72 hours Thyroid Function Tests: No results found for this basename: TSH,T4TOTAL,FREET3,T3FREE,THYROIDAB in the last 72 hours  RADIOLOGY: Dg Chest 2 View  05/15/2012  *RADIOLOGY REPORT*  Clinical Data: Fever.  CHEST - 2 VIEW  Comparison: 01/24/2012.  Findings: The pacer wires are stable.  The cardiac silhouette, mediastinal and hilar contours are mildly prominent but unchanged. There is persistent central vascular congestion and possible mild interstitial edema.  No pleural effusion or focal infiltrate.  The bony thorax is intact.  IMPRESSION: Stable cardiac enlargement and central vascular congestion with possible mild interstitial edema.   Original Report Authenticated By: P. Loralie Champagne, M.D.    Ct Tibia Fibula Left W Contrast  05/27/2012  *RADIOLOGY REPORT*  Clinical Data:  Diabetic with foot pain, erythema and swelling. Possible abscess.  CT OF THE LEFT LOWER LEG WITH CONTRAST  Technique:  Multidetector CT imaging of the left lower leg was performed according to the standard protocol following intravenous contrast administration. Multiplanar CT image reconstructions were also generated.  Contrast:  80 ml Omnipaque-300  Comparison:  Foot radiographs 05/15/2012 and today.  Findings:  Imaging was performed from the proximal left lower leg through the left foot as specifically requested.  There is an ill-defined peripherally enhancing fluid collection within the  fourth webspace of the foot.  This contains a small air bubble and measures up to 2.1 cm in greatest dimension. Fluid extends distally into the fourth toe.  There is some additional soft tissue emphysema tracking medially within the plantar forefoot within the plantar aspects of the second, third and fourth toes. No other measurable fluid collections are seen.  There is subcutaneous edema throughout the foot to extending into the distal lower leg.  Superficial  varicosities are noted within the lower leg.  Bone windows demonstrate no cortical destruction or significant arthropathic changes.  IMPRESSION:  1.  There is an ill-defined fluid collection within the fourth webspace with distal extension into the fourth toe and along the plantar aspects of the second and third toes.  This collection demonstrates peripheral enhancement and air bubbles and is consistent with an abscess. 2.  No CT evidence of osteomyelitis. 3.  Nonspecific subcutaneous edema in the distal lower leg and plantar foot.   Original Report Authenticated By: [name], M.D.    Dg Chest Port 1 View  05/29/2012  *RADIOLOGY REPORT*  Clinical Data: Evaluate for congestive heart failure.  PORTABLE CHEST - 1 VIEW  Comparison: Chest x-ray 05/25/2012.  Findings: Lung volumes are low.  No consolidative airspace disease. Probable trace bilateral pleural effusions.  Pulmonary venous congestion without frank pulmonary edema.  Mild cardiomegaly is unchanged. The patient is rotated to the left on today's exam, resulting in distortion of the mediastinal contours and reduced diagnostic sensitivity and specificity for mediastinal pathology. Atherosclerosis of the thoracic aorta. There is a right upper extremity PICC with tip terminating in the distal superior vena cava. Left-sided pacemaker device in place with lead tips projecting over the expected location of the right atrium and right ventricular apex.  IMPRESSION: 1.  Mild cardiomegaly with pulmonary venous congestion, but no frank pulmonary edema. 2.  Trace bilateral pleural effusions. 3.  Atherosclerosis. 4.  Support apparatus, as above.   Original Report Authenticated By: Florencia Reasons, M.D.    Dg Chest Port 1 View  05/25/2012  *RADIOLOGY REPORT*  Clinical Data: PICC line placement.  PORTABLE CHEST - 1 VIEW  Comparison: 05/21/2012  Findings: Right-sided PICC line noted with tip projecting over the SVC.  No pneumothorax or complicating feature.   Dual lead pacer noted.  There is mild cardiomegaly along with stable interstitial accentuation.  Peripheral airspace opacity in the right mid lung appears slightly less striking.  Atherosclerotic calcification in the aortic arch noted.  IMPRESSION:  1.  Right PICC line tip:  SVC.  No pneumothorax or complicating feature. 2.  Cardiomegaly with mild interstitial accentuation, suspicious for mild interstitial edema. 3.  Faint airspace disease peripherally in the right mid lung appears improved.   Original Report Authenticated By: [name], M.D.    Dg Foot 2 Views Left  05/27/2012  *RADIOLOGY REPORT*  Clinical Data: Diabetic foot infection  LEFT FOOT - 2 VIEW  Comparison: 05/15/2012  Findings: Scattered soft tissue swelling. Diffuse osseous demineralization. Foci of soft tissue gas identified between the bases of the fourth and fifth toes. Joint spaces preserved. Hallux valgus. No acute fracture, dislocation or bone destruction.  IMPRESSION: Soft tissue gas between the bases of the fourth and fifth toes consistent with soft tissue infection. No definite evidence of osteomyelitis. If further soft tissue assessment needed or persistent clinical concern for occult osteomyelitis remains, consider MR imaging of the left foot.   Original Report Authenticated By: [name], M.D.  Dg Foot Complete Left  05/15/2012  *RADIOLOGY REPORT*  Clinical Data: Diabetic foot ulcers.  LEFT FOOT - COMPLETE 3+ VIEW  Comparison: None  Findings: The joint spaces are maintained.  No acute bony findings or destructive bony changes.  IMPRESSION: No plain film findings for osteomyelitis.   Original Report Authenticated By: P. Loralie Champagne, M.D.     PHYSICAL EXAM  Patient is resting. Lung exam reveals diffuse rales. Cardiac exam reveals S1 and S2. There no clicks or significant murmurs. There is no significant edema in her right leg.   ASSESSMENT AND PLAN:   *Osteomyelitis  Hypertension  Dyslipidemia  CAD  (coronary artery disease)  Pacemaker  Diabetes mellitus  Dementia    PAD (peripheral artery disease)     We are hoping that at some point she can have vascular surgery. However with her renal status this will certainly have to be put on hold.   Acute renal insufficiency    This appears to be the major problem at this time. Please refer to my prior notes concerning my thought process. As of yesterday I felt that she was total body volume overloaded. With her creatinine rising in that setting I felt that there might be a component of nonoliguric ATN. I gave her a dose 120 mg of IV Lasix. She did have relatively good urine output. However her creatinine is now up to 3.4. I will have to leave the approach to her renal status to the internal medicine team and the possibility of involving nephrology. I've chosen today not to give any extra fluid and not to give any diuretics as of this morning. We'll have to see what the other physicians decide.    Acute on chronic diastolic CHF (congestive heart failure)   She does have relatively good systolic function. It is my impression that she is volume overloaded. I will appreciate input from others. Of course at this time her volume status is a function of both her diastolic function and also her actual renal function if she's had an episode of ATN.   Willa Rough 05/31/2012 7:23 AM

## 2012-05-31 NOTE — Consult Note (Signed)
[facility] CONSULT NOTE    Date: 05/31/2012                  Patient Name:  Brittney [name]  MRN: [id number]  DOB: 1931-12-30  Age / Sex: 78 y.o., female         PCP: [name], MD                 Service Requesting Consult: [facility]- Dr. Arbutus Leas                 Reason for Consult: Acute on chronic renal failure            History of Present Illness: Patient is a 78 y.o. female with a PMHx of HTN, DM, CAD, A.fib, PVD, CKD with baseline Cr 1.0-1.3 who was transferred to [facility] on 05/21/2012 from [facility] for evaluation of worsening diabetic foot wound. Patient is now post-op day #3 from I&D of abscess and amputation of digits 3,4,5 of left foot. Patient's creatinine has been trending up, therefore nephrology was consulted to evaluate. Based on old records, patient had some acute renal insufficiency in 2010 after cardiac cath. Her baseline Creatinine ranges from 1.0-1.3, and is currently 3.43. She is making urine with 850cc/24 hours. Cardiology is also following this patient due to her complex past medical history. She received a large dose of diuretics yesterday due to concern of fluid overload, but her creatinine failed to improve. Of note, she was receiving Vancomycin from 9/14-9/21 with a trough of 22.1 on 9/22. She also had a CT with contrast on 05/27/12 as well as aortogram on 9/16 receiving a total of 95cc of contrast.  Patient's daughter is at bedside, who is her POA. She states patient has had issues with her kidneys at prior hospitalizations as well. She also states she as a "stent in one of her kidneys", although unsure when that was placed or why. She has been followed by nephrology in [place], unsure of who. Per daughter, her renal function had been doing well until this hospitalization.   Medications: Outpatient medications: Prescriptions prior to admission  Medication Sig Dispense Refill  . acetaminophen (TYLENOL) 500 MG tablet Take 500 mg by  mouth every 6 (six) hours as needed. For pain      . Ascorbic Acid (VITAMIN C) 1000 MG tablet Take 1,000 mg by mouth daily.        . furosemide (LASIX) 40 MG tablet Take 1 tablet (40 mg total) by mouth 2 (two) times daily.  60 tablet  3  . insulin aspart (NOVOLOG) 100 UNIT/ML injection Inject 5-9 Units into the skin 5 (five) times daily. Per sliding scale.  90-200: 5 units, 201-250: 6 units, 251-300: 7 units, 301-350: 8 units, 351-400: 9 units      . insulin glargine (LANTUS) 100 UNIT/ML injection Inject 15 Units into the skin at bedtime.        [name] levothyroxine (SYNTHROID, LEVOTHROID) 75 MCG tablet Take 75 mcg by mouth daily.        [name] linagliptin (TRADJENTA) 5 MG TABS tablet Take 5 mg by mouth daily.      [name] lisinopril (PRINIVIL) 10 MG tablet Take 1 tablet (10 mg total) by mouth daily.  30 tablet  3  . nitroGLYCERIN (NITROSTAT) 0.4 MG SL tablet Place 0.4 mg under the tongue every 5 (five) minutes as needed. For chest pain      . sertraline (ZOLOFT) 100 MG tablet Take 100 mg by mouth  daily.        . sulfamethoxazole-trimethoprim (BACTRIM DS) 800-160 MG per tablet Take 1 tablet by mouth 2 (two) times daily.      . travoprost, benzalkonium, (TRAVATAN) 0.004 % ophthalmic solution Place 1 drop into both eyes at bedtime.          Current medications: Current Facility-Administered Medications  Medication Dose Route Frequency Provider Last Rate Last Dose  . acetaminophen (TYLENOL) tablet 650 mg  650 mg Oral Q4H PRN Fransisco Hertz, MD      . alum & mag hydroxide-simeth (MAALOX/MYLANTA) 200-200-20 MG/5ML suspension 30 mL  30 mL Oral Q6H PRN Ron Parker, MD   30 mL at 05/29/12 1037  . docusate sodium (COLACE) capsule 100 mg  100 mg Oral BID Catarina Hartshorn, MD   100 mg at 05/31/12 1053  . enoxaparin (LOVENOX) injection 30 mg  30 mg Subcutaneous Q24H Catarina Hartshorn, MD   30 mg at 05/30/12 2152  . feeding supplement (GLUCERNA SHAKE) liquid 237 mL  237 mL Oral TID BM Catarina Hartshorn, MD   237 mL at 05/31/12 1054  .  fentaNYL (SUBLIMAZE) injection 25-50 mcg  25-50 mcg Intravenous Q5 min PRN Judie Petit, MD      . furosemide (LASIX) 120 mg in dextrose 5 % 50 mL IVPB  120 mg Intravenous Once [name], MD   120 mg at 05/30/12 1238  . HYDROmorphone (DILAUDID) injection 0.5-1 mg  0.5-1 mg Intravenous Q3H PRN Ron Parker, MD   1 mg at 05/28/12 1700  . insulin aspart (novoLOG) injection 0-5 Units  0-5 Units Subcutaneous QHS Ron Parker, MD   3 Units at 05/27/12 2200  . insulin aspart (novoLOG) injection 0-9 Units  0-9 Units Subcutaneous TID WC Ron Parker, MD   2 Units at 05/31/12 0820  . insulin glargine (LANTUS) injection 11 Units  11 Units Subcutaneous QHS Catarina Hartshorn, MD   11 Units at 05/30/12 2249  . levothyroxine (SYNTHROID, LEVOTHROID) tablet 88 mcg  88 mcg Oral QAC breakfast Catarina Hartshorn, MD   88 mcg at 05/31/12 0981  . ondansetron (ZOFRAN) tablet 4 mg  4 mg Oral Q6H PRN Ron Parker, MD       Or  . ondansetron (ZOFRAN) injection 4 mg  4 mg Intravenous Q6H PRN Ron Parker, MD      . ondansetron [facility]) injection 4 mg  4 mg Intravenous Q6H PRN Fransisco Hertz, MD   4 mg at 05/29/12 1335  . oxyCODONE (Oxy IR/ROXICODONE) immediate release tablet 5 mg  5 mg Oral Q4H PRN Ron Parker, MD   5 mg at 05/31/12 0640  . oxyCODONE (OXYCONTIN) 12 hr tablet 10 mg  10 mg Oral Q12H Catarina Hartshorn, MD   10 mg at 05/31/12 1053  . piperacillin-tazobactam (ZOSYN) IVPB 2.25 g  2.25 g Intravenous Q6H Catarina Hartshorn, MD   2.25 g at 05/31/12 0640  . protein supplement (RESOURCE BENEPROTEIN) powder 6 g  1 scoop Oral TID WC Catarina Hartshorn, MD   6 g at 05/31/12 1914  . senna (SENOKOT) tablet 17.2 mg  2 tablet Oral QHS Catarina Hartshorn, MD   17.2 mg at 05/30/12 2248  . sertraline (ZOLOFT) tablet 100 mg  100 mg Oral Daily Sorin Luanne Bras, MD   100 mg at 05/31/12 1054  . sodium chloride 0.9 % injection 10-40 mL  10-40 mL Intracatheter PRN Catarina Hartshorn, MD   10 mL at 05/31/12 0612  . sodium  chloride 0.9 % injection 50 mL  50  mL Intracatheter PRN [name], MD      . Travoprost (BAK Free) (TRAVATAN) 0.004 % ophthalmic solution SOLN 1 drop  1 drop Both Eyes QHS Debby Crosley, MD   1 drop at 05/30/12 2152  . vitamin C (ASCORBIC ACID) tablet 1,000 mg  1,000 mg Oral Daily Sorin Luanne Bras, MD   1,000 mg at 05/31/12 1054  . zolpidem (AMBIEN) tablet 5 mg  5 mg Oral QHS PRN Ron Parker, MD      . DISCONTD: 0.9 %  sodium chloride infusion   Intravenous Continuous [name], MD 50 mL/hr at 05/29/12 1456      Allergies: Allergies  Allergen Reactions  . Amiodarone     Autonomic dysfunction  . Morphine Sulfate     REACTION: jerking  . Oxycodone Hcl Itching and Other (See Comments)    Feels weird    Past Medical History: Past Medical History  Diagnosis Date  . Hypertension   . Dyslipidemia   . CAD (coronary artery disease)     Non-STEMI August, 2010,,,, 40% left main, 30% LAD, 60% OM 2, 40% RCA, 90% OM1-culprit lesion-too small for intervention           . Ejection fraction     EF 55% ,2011 / EF 30% with tachycardia cardiomyopathy / EF 50% January, 2012  . Diabetes mellitus   . Dementia     ?? Early dementia ?? 2012  . GERD (gastroesophageal reflux disease)   . TIA (transient ischemic attack)     History of TIAs  . Hypothyroidism   . PAD (peripheral artery disease)     Carotid endarterectomy, right. Stents right peroneal, right superficial, right popliteal arteries, Dr. Edilia Bo  . Amputation     Right midfoot 2010  . CKD (chronic kidney disease)     Creatinine 1.19 at December 2012 discharge  . Gait disorder   . Vertebrobasilar insufficiency   . Depression   . Hyperkalemia     ACE Inhibitor held  . Acute renal insufficiency     Catheterization August 2010  . Blindness of right eye   . Respiratory failure     Hypoxic and hypercapnic, 2011, etiology pneumonia  . Orthostatic hypotension     Syncope January 2012 with facial trauma, amiodarone stopped  . Atrial fibrillation 8/10    on  pradaxa, s/p AV nodal ablation  . Ventricular tachycardia     20 beats December, 2012, asymptomatic  . Atrial flutter 05/24/09    Hospitalization in [place]; with difficult rate control; TEE cardioversion done EF 30%, secondary to tachycardia, pt did convert back tosinus rhythm; return 10/11, amiodarone restarted 10/11, no longer on it. Recurrence with RVR 08/2011.  [name] Anemia in CKD (chronic kidney disease)   . Gastroparesis 3/11    Abnormal GES  . Drug therapy,  Sotolol     Sotolol started 08/2011.  . Drug therapy     Pradaxa January, 2013  . Complete heart block     s/p AV nodal ablation  . Chronic diastolic CHF (congestive heart failure)     echo 03/2012- LVEF 55-60%, grade 2 diastolic dysfunction, mild LA dilatation, mild MR, paradoxical vent septal motion  . On home O2     prn   Past Surgical History: Past Surgical History  Procedure Date  . Total abdominal hysterectomy   . Cholecystectomy   . Carotid endarterectomy   . Amputation 2010  RIGHT MIDFOOT  . Tonsillectomy   . Eye surgeries     Several right  . Pacemaker insertion     SJM Pacemaker implant 9/10  . Pci stent to the right peroneal and right superficial as well as right popliteal arteries     Dr. Edilia Bo  . Av nodal ablation 08/2011    by Dr Ladona Ridgel   Family History: Family History  Problem Relation Age of Onset  . Cancer Brother     Colon cancer  . Aortic aneurysm Mother   . Colon polyps Neg Hx   . Liver disease Neg Hx     And no CRC    Social History: History   Social History  . Marital Status: Widowed    Spouse Name: N/A    Number of Children: 3  . Years of Education: N/A   Occupational History  . RETIRED    Social History Main Topics  . Smoking status: Never Smoker   . Smokeless tobacco: Never Used  . Alcohol Use: No  . Drug Use: No  . Sexually Active: No   Other Topics Concern  . Not on file   Social History Narrative   Gets regular exercise.   Review of Systems: As per  HPI. Patient with some baseline dementia, unable to fully report ROS  Vital Signs: Blood pressure 116/62, pulse 70, temperature 97.3 F (36.3 C), temperature source Axillary, resp. rate 18, height 5\' 7"  (1.702 m), weight 172 lb 6.4 oz (78.2 kg), SpO2 97.00%.  Weight trends: Filed Weights   05/26/12 0516 05/30/12 0646 05/31/12 0645  Weight: 154 lb 15.7 oz (70.3 kg) 175 lb 4.3 oz (79.5 kg) 172 lb 6.4 oz (78.2 kg)    Physical Exam: General: Vital signs reviewed and noted. Elderly white female, lying in bed. NAD. Responds appropriately to questions  Head: Normocephalic, atraumatic.  Eyes: No signs of jaundice  Nose: Mucous membranes moist, not inflammed, nonerythematous.  Neck: No deformities, masses, or tenderness noted.Supple  Lungs:  Normal respiratory effort. Clear to auscultation BL but somewhat diminished at bases  Heart: RRR. No murmur appreciated. Pacemaker left upper chest  Abdomen:  BS normoactive. Soft, Nondistended, mild TTP suprapubic.  No masses or organomegaly.  Extremities: Mild edema of left lower extremity. edema in dependent areas. PICC RUE. Right forefoot surgically absent, well healed. Left foot wrapped; dressing C/D/I.  Neurologic: Oriented to person, place and location. Not oriented to date, year or situation. Follows commands. Good grip bilaterally  Skin: No visible rashes   Lab results: Basic Metabolic Panel:  Lab 05/31/12 4782 05/30/12 0545 05/29/12 0540  NA 133* 134* 132*  K 4.3 4.5 4.5  CL 93* 96 95*  CO2 31 30 31   GLUCOSE 157* 186* 206*  BUN 34* 29* 23  CREATININE 3.43* 2.88* 2.10*  CALCIUM 9.0 9.0 8.8  MG -- -- --  PHOS -- -- --   CBC:  Lab 05/31/12 0610 05/30/12 0545 05/29/12 0540 05/28/12 0500 05/27/12 0500  WBC 10.9* 11.4* 14.1* -- --  NEUTROABS -- -- -- -- --  HGB 8.8* 8.9* 9.1* -- --  HCT 27.9* 28.2* 29.3* -- --  MCV 90.9 91.0 91.0 90.7 89.5  PLT 332 354 341 -- --   CBG:  Lab 05/31/12 0740 05/30/12 2210 05/30/12 1730 05/30/12 1116  05/30/12 0740  GLUCAP 151* 155* 151* 197* 172*    Microbiology: Results for orders placed during the hospital encounter of 05/21/12  CULTURE, BLOOD (ROUTINE X 2)     Status:  Normal   Collection Time   05/23/12  8:00 PM      Component Value Range Status Comment   Specimen Description BLOOD LEFT HAND   Final    Special Requests BOTTLES DRAWN AEROBIC ONLY 10CC   Final    Culture  Setup Time 05/24/2012 14:17   Final    Culture NO GROWTH 5 DAYS   Final    Report Status 05/30/2012 FINAL   Final   CULTURE, BLOOD (ROUTINE X 2)     Status: Normal   Collection Time   05/23/12  8:14 PM      Component Value Range Status Comment   Specimen Description BLOOD RIGHT HAND   Final    Special Requests BOTTLES DRAWN AEROBIC ONLY 10CC   Final    Culture  Setup Time 05/24/2012 14:18   Final    Culture NO GROWTH 5 DAYS   Final    Report Status 05/30/2012 FINAL   Final   CULTURE, ROUTINE-ABSCESS     Status: Normal (Preliminary result)   Collection Time   05/28/12 10:58 AM      Component Value Range Status Comment   Specimen Description ABSCESS FOOT LEFT   Final    Special Requests NONE   Final    Gram Stain PENDING   Incomplete    Culture Culture reincubated for better growth   Final    Report Status PENDING   Incomplete    Imaging: Dg Chest Port 1 View  05/29/2012  *RADIOLOGY REPORT*  Clinical Data: Evaluate for congestive heart failure.  PORTABLE CHEST - 1 VIEW  Comparison: Chest x-ray 05/25/2012.  Findings: Lung volumes are low.  No consolidative airspace disease. Probable trace bilateral pleural effusions.  Pulmonary venous congestion without frank pulmonary edema.  Mild cardiomegaly is unchanged. The patient is rotated to the left on today's exam, resulting in distortion of the mediastinal contours and reduced diagnostic sensitivity and specificity for mediastinal pathology. Atherosclerosis of the thoracic aorta. There is a right upper extremity PICC with tip terminating in the distal superior vena  cava. Left-sided pacemaker device in place with lead tips projecting over the expected location of the right atrium and right ventricular apex.  IMPRESSION: 1.  Mild cardiomegaly with pulmonary venous congestion, but no frank pulmonary edema. 2.  Trace bilateral pleural effusions. 3.  Atherosclerosis. 4.  Support apparatus, as above.   Original Report Authenticated By: Florencia Reasons, M.D.      Assessment & Plan: Pt is a 78 y.o. yo female with a PMHX of HTN, DM, CAD, A.fib, PVD, CKD with baseline Cr 1.0-1.3 who was transferred to [facility] on 05/21/2012 from [facility] for evaluation of worsening diabetic foot wound, now s/p I&D of abscess and amputation of digits 3,4,5.  ARF- Patient with CKD, now with elevated creatinine. Differential includes volume overload, drug toxicity with supertherapeutic vanc level, but most likely due to contrasted study in setting of CKD and DM. Currently, patient is not uremic. She has good urine output. Her electrolytes are stable. She is not acidotic.  - Will check UA now, as well as urine sodium and urine creat - Renal U/S - Strict I/O's - Avoid further contrast and nephrotoxic drugs including NSAIDs - Renal panel in the AM - Will add on phos to this AM labs, if possible  Anemia- Appears she does not have chronic anemia, but her HgB has been trending down post-operatively. VSS has ordered a tagged RBC scan.  - Check iron panel - Monitor  HgB daily  Foot infection- Possible osteomyelitis based on clinical findings. VSS following, and patient to OR 9/20 for debridement as well as amputation of digits. - Continue Zosyn, per primary team - Consider rechecking Vanc trough in a few days to confirm clearance  CHF/A.fib- Followed by Cardiology.  - Rate is controlled at this time with pacemaker  PAD- VSS planning for bypass tomorrow.   DM- Per primary team. A1c 7.6. CBG controlled with SSI at this time  Nutrition- Albumin low at 3.3, and has been low for a  while - Continue nutrition supplementation - Consider nutrition consult  Dispo- Closely monitor creatinine trend. No indication for HD today, but will closely follow. If patient does require HD, this will be a long discussion with family considering her multiple comorbidities.   DVT PPX - Lovenox, per primary team  Thank you for this interesting consult. Will discuss with Dr. Darrick Penna.  Amber M. Hairford, M.D. 05/31/2012 11:25 AM   I have seen and examined this patient and agree with the plan of care seen and eval.  Most likely contrast. R/o AIN from Vanco or Zosyn.  Vol xs mild at this time, try to keep even.  Avoid procedures at this time.  Will review meds, do appropriate diagnostic studies, and follow.  Discussed and counseled daughter. .  Maggy Wyble L 05/31/2012, 12:38 PM

## 2012-05-31 NOTE — Progress Notes (Signed)
ANTIBIOTIC CONSULT NOTE - INITIAL  Pharmacy Consult: Zosyn Indication:  Cellulitis   Allergies  Allergen Reactions  . Amiodarone     Autonomic dysfunction  . Morphine Sulfate     REACTION: jerking  . Oxycodone Hcl Itching and Other (See Comments)    Feels weird    Patient Measurements: Height: 5\' 7"  (170.2 cm) Weight: 172 lb 6.4 oz (78.2 kg) IBW/kg (Calculated) : 61.6   Vital Signs: Temp: 97.3 F (36.3 C) (09/23 0645) Temp src: Axillary (09/23 0645) BP: 116/62 mmHg (09/23 0645) Pulse Rate: 70  (09/23 0645) Intake/Output from previous day: 09/22 0701 - 09/23 0700 In: 1148.7 [P.O.:597; I.V.:351.7; IV Piggyback:200] Out: 1100 [Urine:1100] Intake/Output from this shift: Total I/O In: 120 [P.O.:120] Out: -   Labs:  Basename 05/31/12 0610 05/30/12 0545 05/29/12 0540  WBC 10.9* 11.4* 14.1*  HGB 8.8* 8.9* 9.1*  PLT 332 354 341  LABCREA -- -- --  CREATININE 3.43* 2.88* 2.10*   Estimated Creatinine Clearance: 14.6 ml/min (by C-G formula based on Cr of 3.43).  Basename 05/30/12 0545  VANCOTROUGH --  Leodis Binet --  VANCORANDOM 22.1  GENTTROUGH --  GENTPEAK --  GENTRANDOM --  TOBRATROUGH --  TOBRAPEAK --  TOBRARND --  AMIKACINPEAK --  AMIKACINTROU --  AMIKACIN --     Microbiology: Recent Results (from the past 720 hour(s))  URINE CULTURE     Status: Normal   Collection Time   05/15/12  3:10 PM      Component Value Range Status Comment   Specimen Description URINE, CLEAN CATCH   Final    Special Requests NONE   Final    Culture  Setup Time 05/15/2012 20:48   Final    Colony Count 5,000 COLONIES/ML   Final    Culture INSIGNIFICANT GROWTH   Final    Report Status 05/16/2012 FINAL   Final   CULTURE, BLOOD (ROUTINE X 2)     Status: Normal   Collection Time   05/15/12  4:46 PM      Component Value Range Status Comment   Specimen Description BLOOD LEFT ARM   Final    Special Requests     Final    Value: BOTTLES DRAWN AEROBIC AND ANAEROBIC 8CC EACH BOTTLE   Culture NO GROWTH 5 DAYS   Final    Report Status 05/20/2012 FINAL   Final   CULTURE, BLOOD (ROUTINE X 2)     Status: Normal   Collection Time   05/15/12  4:51 PM      Component Value Range Status Comment   Specimen Description BLOOD LEFT HAND   Final    Special Requests     Final    Value: BOTTLES DRAWN AEROBIC AND ANAEROBIC 8CC EACH BOTTLE   Culture NO GROWTH 5 DAYS   Final    Report Status 05/20/2012 FINAL   Final   MRSA PCR SCREENING     Status: Abnormal   Collection Time   05/15/12 10:20 PM      Component Value Range Status Comment   MRSA by PCR POSITIVE (*) NEGATIVE Final   CULTURE, BLOOD (ROUTINE X 2)     Status: Normal   Collection Time   05/23/12  8:00 PM      Component Value Range Status Comment   Specimen Description BLOOD LEFT HAND   Final    Special Requests BOTTLES DRAWN AEROBIC ONLY 10CC   Final    Culture  Setup Time 05/24/2012 14:17   Final  Culture NO GROWTH 5 DAYS   Final    Report Status 05/30/2012 FINAL   Final   CULTURE, BLOOD (ROUTINE X 2)     Status: Normal   Collection Time   05/23/12  8:14 PM      Component Value Range Status Comment   Specimen Description BLOOD RIGHT HAND   Final    Special Requests BOTTLES DRAWN AEROBIC ONLY 10CC   Final    Culture  Setup Time 05/24/2012 14:18   Final    Culture NO GROWTH 5 DAYS   Final    Report Status 05/30/2012 FINAL   Final   CULTURE, ROUTINE-ABSCESS     Status: Normal (Preliminary result)   Collection Time   05/28/12 10:58 AM      Component Value Range Status Comment   Specimen Description ABSCESS FOOT LEFT   Final    Special Requests NONE   Final    Gram Stain PENDING   Incomplete    Culture Culture reincubated for better growth   Final    Report Status PENDING   Incomplete     Medical History: Past Medical History  Diagnosis Date  . Hypertension   . Dyslipidemia   . CAD (coronary artery disease)     Non-STEMI August, 2010,,,, 40% left main, 30% LAD, 60% OM 2, 40% RCA, 90% OM1-culprit lesion-too small  for intervention           . Ejection fraction     EF 55% ,2011 / EF 30% with tachycardia cardiomyopathy / EF 50% January, 2012  . Diabetes mellitus   . Dementia     ?? Early dementia ?? 2012  . GERD (gastroesophageal reflux disease)   . TIA (transient ischemic attack)     History of TIAs  . Hypothyroidism   . PAD (peripheral artery disease)     Carotid endarterectomy, right. Stents right peroneal, right superficial, right popliteal arteries, Dr. Edilia Bo  . Amputation     Right midfoot 2010  . CKD (chronic kidney disease)     Creatinine 1.19 at December 2012 discharge  . Gait disorder   . Vertebrobasilar insufficiency   . Depression   . Hyperkalemia     ACE Inhibitor held  . Acute renal insufficiency     Catheterization August 2010  . Blindness of right eye   . Respiratory failure     Hypoxic and hypercapnic, 2011, etiology pneumonia  . Orthostatic hypotension     Syncope January 2012 with facial trauma, amiodarone stopped  . Atrial fibrillation 8/10    on pradaxa, s/p AV nodal ablation  . Ventricular tachycardia     20 beats December, 2012, asymptomatic  . Atrial flutter 05/24/09    Hospitalization in [place]; with difficult rate control; TEE cardioversion done EF 30%, secondary to tachycardia, pt did convert back tosinus rhythm; return 10/11, amiodarone restarted 10/11, no longer on it. Recurrence with RVR 08/2011.  [name] Anemia in CKD (chronic kidney disease)   . Gastroparesis 3/11    Abnormal GES  . Drug therapy,  Sotolol     Sotolol started 08/2011.  . Drug therapy     Pradaxa January, 2013  . Complete heart block     s/p AV nodal ablation  . Chronic diastolic CHF (congestive heart failure)     echo 03/2012- LVEF 55-60%, grade 2 diastolic dysfunction, mild LA dilatation, mild MR, paradoxical vent septal motion  . On home O2     prn  Assessment: 44 YOF was discharged from [facility] 9/12, had been on vancomycin for cellulitis and improved, sent home on PO ABX,  re-admitted 05/21/12 to [facility] and resumed on vancomycin for cellulitis.  Patient has h/o MRSA infection and MD suspects underlying osteomyelitis.  Vancomycin was discontinued on 9/21 and Zosyn was also discontinued but resumed on the same day.  Pharmacy consult to monitor Zosyn dosing with worsening renal function.  S/p L foot aortogram 9/16 9/15: Vanc>> 9/21 9/15: Zosyn>>  9/15: Blood:  negative 9/20: left foot abscess: culture reincubated   Plan:  - Continue Zosyn 2.25gm IV Q6H - Monitor clinical course, renal function      Brittney [name] D. Laney Potash, PharmD, BCPS Pager:  [phone] 05/31/2012, 1:25 PM

## 2012-05-31 NOTE — Progress Notes (Signed)
Clinical Social Work  CSW met with patient and sister at [employer]. CSW discussed patient's ability to ambulate and possible dc needs. Patient and dtr both agree that patient will not go to SNF at dc. CSW questioned dtr regarding providing 24 hour for patient at dc but dtr reports they do not have any plans. Dtr prefers [facility] since patient has been there in the past. CSW spoke with patient and dtr regarding completing SNF search in case patient was not eligible for CIR placement. Dtr is not agreeable to SNF search at this time. Dtr reports she will talk with brother and formulate a plan. CSW agreed to follow up at a later time.  [place], [place] [phone]

## 2012-05-31 NOTE — Progress Notes (Signed)
Pt without complaint  Filed Vitals:   05/30/12 1430 05/30/12 2202 05/31/12 0645 05/31/12 1328  BP: 109/51 137/52 116/62 110/48  Pulse: 68 70 70 70  Temp: 98.7 F (37.1 C) 98.5 F (36.9 C) 97.3 F (36.3 C) 98.6 F (37 C)  TempSrc: Oral Oral Axillary Oral  Resp: 18 19 18 18   Height:      Weight:   172 lb 6.4 oz (78.2 kg)   SpO2: 95% 98% 97% 94%   Left foot clean wound but not healing  Cr 3.88  Ideally would like stable renal function preop.  However her foot is not going to last without revascularization.  Will proceed with bypass in am  Plan discussed with pt and daughter.  Fabienne Bruns, MD [facility] Office: [phone] Pager: [phone]

## 2012-06-01 ENCOUNTER — Encounter (HOSPITAL_COMMUNITY): Payer: Self-pay | Admitting: Vascular Surgery

## 2012-06-01 ENCOUNTER — Encounter (HOSPITAL_COMMUNITY)
Admission: EM | Disposition: A | Discharge status: Skilled Nursing Facility | Payer: Self-pay | Source: Other Acute Inpatient Hospital | Attending: Internal Medicine

## 2012-06-01 LAB — TYPE AND SCREEN
ABO/RH(D): A POS
Antibody Screen: NEGATIVE

## 2012-06-01 LAB — CBC
HCT: 28.3 % — ABNORMAL LOW (ref 36.0–46.0)
Hemoglobin: 8.7 g/dL — ABNORMAL LOW (ref 12.0–15.0)
MCH: 28.1 pg (ref 26.0–34.0)
MCHC: 30.7 g/dL (ref 30.0–36.0)
MCV: 91.3 fL (ref 78.0–100.0)
Platelets: 336 10*3/uL (ref 150–400)
RBC: 3.1 MIL/uL — ABNORMAL LOW (ref 3.87–5.11)
RDW: 15 % (ref 11.5–15.5)
WBC: 11.8 10*3/uL — ABNORMAL HIGH (ref 4.0–10.5)

## 2012-06-01 LAB — PROTIME-INR
INR: 1.22 (ref 0.00–1.49)
Prothrombin Time: 15.2 s (ref 11.6–15.2)

## 2012-06-01 LAB — CULTURE, ROUTINE-ABSCESS: Gram Stain: NONE SEEN

## 2012-06-01 LAB — RENAL FUNCTION PANEL
Albumin: 1.9 g/dL — ABNORMAL LOW (ref 3.5–5.2)
BUN: 41 mg/dL — ABNORMAL HIGH (ref 6–23)
CO2: 31 mEq/L (ref 19–32)
Chloride: 95 mEq/L — ABNORMAL LOW (ref 96–112)
GFR calc non Af Amer: 9 mL/min — ABNORMAL LOW (ref >90)
Potassium: 4.6 mEq/L (ref 3.5–5.1)

## 2012-06-01 LAB — VANCOMYCIN, RANDOM: Vancomycin Rm: 19.9 ug/mL

## 2012-06-01 SURGERY — BYPASS GRAFT FEMORAL-POPLITEAL ARTERY
Anesthesia: General | Site: Leg Upper | Laterality: Right

## 2012-06-01 MED ORDER — SODIUM CHLORIDE 0.9 % IV SOLN
1020.0000 mg | Freq: Once | INTRAVENOUS | Status: AC
  Administered 2012-06-01: 1020 mg via INTRAVENOUS
  Filled 2012-06-01: qty 34

## 2012-06-01 MED ORDER — SODIUM CHLORIDE 0.9 % IV SOLN
6.0000 mg/kg | INTRAVENOUS | Status: DC
  Administered 2012-06-01: 472 mg via INTRAVENOUS
  Filled 2012-06-01 (×2): qty 9.44

## 2012-06-01 NOTE — Progress Notes (Signed)
SUBJECTIVE:  She denies any acute dyspnea.  She is lying flat.    PHYSICAL EXAM Filed Vitals:   05/31/12 0645 05/31/12 1328 06/01/12 0504 06/01/12 1300  BP: 116/62 110/48 120/49 112/41  Pulse: 70 70 67 70  Temp: 97.3 F (36.3 C) 98.6 F (37 C) 99.2 F (37.3 C) 98.6 F (37 C)  TempSrc: Axillary Oral Oral Oral  Resp: 18 18 18 16   Height:      Weight: 172 lb 6.4 oz (78.2 kg)  173 lb 8 oz (78.7 kg)   SpO2: 97% 94% 94% 98%   General:  No distress Lungs:  Few basilar crackles Heart:  RRR Abdomen:  Positive bowel sounds, no rebound no guarding Extremities:  Diffuse non pitting upper extremity edema  LABS:  Results for orders placed during the hospital encounter of 05/21/12 (from the past 24 hour(s))  GLUCOSE, CAPILLARY     Status: Abnormal   Collection Time   05/31/12  6:37 PM      Component Value Range   Glucose-Capillary 138 (*) 70 - 99 mg/dL  GLUCOSE, CAPILLARY     Status: Abnormal   Collection Time   05/31/12 10:07 PM      Component Value Range   Glucose-Capillary 173 (*) 70 - 99 mg/dL  TYPE AND SCREEN     Status: Normal   Collection Time   06/01/12  5:30 AM      Component Value Range   ABO/RH(D) A POS     Antibody Screen NEG     Sample Expiration 06/04/2012    VANCOMYCIN, RANDOM     Status: Normal   Collection Time   06/01/12  5:31 AM      Component Value Range   Vancomycin Rm 19.9    PROTIME-INR     Status: Normal   Collection Time   06/01/12  5:32 AM      Component Value Range   Prothrombin Time 15.2  11.6 - 15.2 seconds   INR 1.22  0.00 - 1.49  CBC     Status: Abnormal   Collection Time   06/01/12  5:32 AM      Component Value Range   WBC 11.8 (*) 4.0 - 10.5 K/uL   RBC 3.10 (*) 3.87 - 5.11 MIL/uL   Hemoglobin 8.7 (*) 12.0 - 15.0 g/dL   HCT 47.8 (*) 29.5 - 62.1 %   MCV 91.3  78.0 - 100.0 fL   MCH 28.1  26.0 - 34.0 pg   MCHC 30.7  30.0 - 36.0 g/dL   RDW 30.8  65.7 - 84.6 %   Platelets 336  150 - 400 K/uL  RENAL FUNCTION PANEL     Status: Abnormal   Collection Time   06/01/12  5:32 AM      Component Value Range   Sodium 134 (*) 135 - 145 mEq/L   Potassium 4.6  3.5 - 5.1 mEq/L   Chloride 95 (*) 96 - 112 mEq/L   CO2 31  19 - 32 mEq/L   Glucose, Bld 128 (*) 70 - 99 mg/dL   BUN 41 (*) 6 - 23 mg/dL   Creatinine, Ser 9.62 (*) 0.50 - 1.10 mg/dL   Calcium 9.0  8.4 - 95.2 mg/dL   Phosphorus 5.2 (*) 2.3 - 4.6 mg/dL   Albumin 1.9 (*) 3.5 - 5.2 g/dL   GFR calc non Af Amer 9 (*) >90 mL/min   GFR calc Af Amer 11 (*) >90 mL/min  GLUCOSE, CAPILLARY  Status: Abnormal   Collection Time   06/01/12  7:35 AM      Component Value Range   Glucose-Capillary 118 (*) 70 - 99 mg/dL  GLUCOSE, CAPILLARY     Status: Abnormal   Collection Time   06/01/12 11:56 AM      Component Value Range   Glucose-Capillary 116 (*) 70 - 99 mg/dL  GLUCOSE, CAPILLARY     Status: Abnormal   Collection Time   06/01/12  4:27 PM      Component Value Range   Glucose-Capillary 147 (*) 70 - 99 mg/dL    Intake/Output Summary (Last 24 hours) at 06/01/12 1756 Last data filed at 06/01/12 1500  Gross per 24 hour  Intake    530 ml  Output    400 ml  Net    130 ml    ASSESSMENT AND PLAN:   PVD Surgery is on hold pending her renal function.  CAD (coronary artery disease) No active ischemic issues.  CHF (congestive heart failure) Holding diuretics with her renal insufficiency.  She seems to be handling this from a standpoint of her diastolic HF.  No change in therapy.       Rollene Rotunda 06/01/2012 5:56 PM

## 2012-06-01 NOTE — Consult Note (Signed)
WOC consult Note Reason for Consult: place VAC to open wound of the left foot Wound type:surgical/ amputation site Measurement: 3.5cm x 6.0cm x 2.0cm  Wound ZOX:[id number] tissue, no granulation tissue at this time, some necrosis at the wound edges Drainage (amount, consistency, odor) serosanguinous  Periwound: intact  Dressing procedure/placement/frequency: 1pc black granufoam placed in wound bed, may need to add ostomy barrier ring around remaining toe if seal becomes an issue.  Seal obtained at , pt tolerated well.   Bedside nurse aware of placement, MD at bedside just prior to placement as well.  WOC will follow along with staff for assistance with VAC changes as needed. Emmette Katt [place], [place] [phone]

## 2012-06-01 NOTE — Progress Notes (Signed)
Daily Renal Progress Note  Subjective:   No acute events overnight. Patient received pain medication prior to dressing change. Daughter at bedside, who states patient should be going to bypass surgery today but was not sure if that would happen.   Objective:   BP 120/49  Pulse 67  Temp 99.2 F (37.3 C) (Oral)  Resp 18  Ht 5\' 7"  (1.702 m)  Wt 173 lb 8 oz (78.7 kg)  BMI 27.17 kg/m2  SpO2 94%  Intake/Output Summary (Last 24 hours) at 06/01/12 0654 Last data filed at 06/01/12 0505  Gross per 24 hour  Intake    470 ml  Output    500 ml  Net    -30 ml   Weight change: 1 lb 1.6 oz (0.5 kg)  Physical Exam: Gen: Sleeping upright in bed, comfortable. CVS: RRR, no murmur Resp: CTAB.  ZOX:WRUE, nontender. Dependent edema of flanks Ext: Diffusely edematous  Imaging: US Renal  05/31/2012  *RADIOLOGY REPORT*  Clinical Data: Acute on chronic renal failure  RENAL/URINARY TRACT ULTRASOUND COMPLETE  Comparison:  Abdominal ultrasound dated 11/15/2009  Findings:  Right Kidney:  Measures 10.2 cm.  Echogenic renal parenchyma, likely reflecting medical renal disease.  No mass or hydronephrosis.  Left Kidney:  Measures 9.9 cm.  Echogenic renal parenchyma, likely reflecting medical renal disease.  Bladder:  Decompressed by indwelling Foley catheter.  IMPRESSION: Echogenic renal parenchyma, likely reflecting medical renal disease.  No hydronephrosis.  Bladder decompressed by indwelling Foley catheter.   Original Report Authenticated By: Charline Bills, M.D.     Labs: BMET  Lab 06/01/12 0532 05/31/12 1437 05/31/12 0610 05/30/12 0545 05/29/12 0540 05/28/12 0500 05/27/12 0500 05/26/12 1130  NA 134* -- 133* 134* 132* 138 135 134*  K 4.6 -- 4.3 4.5 4.5 4.4 3.5 4.1  CL 95* -- 93* 96 95* 101 99 100  CO2 31 -- 31 30 31 31 31 27   GLUCOSE 128* -- 157* 186* 206* 191* 125* 297*  BUN 41* -- 34* 29* 23 20 18 17   CREATININE 4.18* -- 3.43* 2.88* 2.10* 1.63* 1.19* 1.19*  ALB -- -- -- -- -- -- -- --  CALCIUM  9.0 -- 9.0 9.0 8.8 8.9 8.8 8.8  PHOS 5.2* 4.7* -- -- -- -- -- --   CBC  Lab 06/01/12 0532 05/31/12 0610 05/30/12 0545 05/29/12 0540  WBC 11.8* 10.9* 11.4* 14.1*  NEUTROABS -- -- -- --  HGB 8.7* 8.8* 8.9* 9.1*  HCT 28.3* 27.9* 28.2* 29.3*  MCV 91.3 90.9 91.0 91.0  PLT 336 332 354 341    Medications:      . docusate sodium  100 mg Oral BID  . enoxaparin (LOVENOX) injection  30 mg Subcutaneous Q24H  . feeding supplement  237 mL Oral TID BM  . insulin aspart  0-5 Units Subcutaneous QHS  . insulin aspart  0-9 Units Subcutaneous TID WC  . insulin glargine  11 Units Subcutaneous QHS  . levothyroxine  88 mcg Oral QAC breakfast  . oxyCODONE  10 mg Oral Q12H  . piperacillin-tazobactam (ZOSYN)  IV  2.25 g Intravenous Q6H  . protein supplement  1 scoop Oral TID WC  . senna  2 tablet Oral QHS  . sertraline  100 mg Oral Daily  . Travoprost (BAK Free)  1 drop Both Eyes QHS  . vitamin C  1,000 mg Oral Daily  . DISCONTD: insulin glargine  11 Units Subcutaneous QHS     Assessment/ Plan:   Pt is a 78 y.o. yo  female with a PMHX of HTN, DM, CAD, A.fib, PVD, CKD with baseline Cr 1.0-1.3 who was transferred to [facility] on 05/21/[facility] from [facility] for evaluation of worsening diabetic foot wound, now s/p I&D of abscess and amputation of digits 3,4,5.   ARF- Patient with CKD, now with elevated creatinine. Differential includes volume overload, drug toxicity with supertherapeutic vanc level, but most likely due to contrasted study in setting of CKD and DM. Currently, patient is not uremic. Her urine output is less than previous day. Her electrolytes are stable. She is not acidotic at this time - UA unremarkable. FENa 1.13   - Renal U/S pending - Strict I/O's  - Avoid further contrast and nephrotoxic drugs including NSAIDs. Would also recommend postponing large surgical procedures for now as well - Renal panel in the AM  - Hep lock IV to decrease any extra fluids  Anemia- Appears she does not  have chronic anemia, but her HgB has been trending down post-operatively. VSS has ordered a tagged RBC scan. Iron is low at 23 with sat of 11 - Will order Feraheme x1 now - Monitor HgB daily   Foot infection- Possible osteomyelitis based on clinical findings. VSS following, and patient to OR 9/20 for debridement as well as amputation of digits.  - Continue Zosyn, per primary team  - Consider rechecking Vanc trough in a few days to confirm clearance   CHF/A.fib- Followed by Cardiology.  - Rate is controlled at this time with pacemaker   PAD- VSS planning for bypass today, which has now been postponed.   DM- Per primary team. A1c 7.6. CBG controlled with SSI at this time   Nutrition- Albumin low at 1.9 today, and has been low for a while  - Continue nutrition supplementation  - Consider nutrition consult   Dispo- Closely monitor creatinine trend as it is now going up. No indication for HD but patient may require short term HD if she does not improve. Daughter states patient would not want long term HD.  DVT PPX - Lovenox, per primary team  [name], MD 06/01/2012, 6:54 AM   I have seen and examined this patient and agree with the plan of care seen and eval.  Vol xs and failing GFR secondary to contrast.  Discussed acute therapy with family. Not candidate for Chronic and family has insight. .  Tyquon Near L 06/01/2012, 12:28 PM

## 2012-06-01 NOTE — Progress Notes (Signed)
[facility] PROGRESS NOTE  Brittney [name]:[id number] DOB: 12-26-1931 DOA: 05/21/2012 PCP: [name], MD  Assessment/Plan: Acute on chronic CKD stage III  -Appreciate nephrology input  -Renal ultrasound (9/23) negative for hydronephrosis, consistent with medical renal disease  -Creatinine worsen partly due to diuresis and IV contrast from CT foot (9/19)  -Patient has been on vancomycin which may play a role--level 19.9 today -Urine eosinophils negative -Likely a component of cardiorenal syndrome  -Baseline creatinine 1.1-1.3 Diabetic foot infection/abscess/osteomyelitis foot -Surgical intraoperative culture showed MRSA -Due to concerns with vancomycin and nephrotoxicity, start daptomycin 6 mg/kg q. 48 hours (adjusted for renal failure) -Check CPK q. 7 days while on daptomycin -Last vancomycin dose September 20 -Plan 42 days of antibiotics unless patient has BKA (abx start September 14) -MRI not possible due to the patient's pacemaker  -osteomyelitis due to her surgical findings on September 20  -pus extended to metatarsals as noted during intra-op findings-->therefore, she clinically has osteomyelitis -Pathology of surgical specimen consistent with acute osteomyelitis -CT of the foot reveals abscess (9/19)  -Appreciate Dr. Darrick Penna, s/p I&D on 9/20  Severe peripheral vascular disease  -Revascularization delayed per Dr. Darrick Penna, appreciate followup Diastolic CHF  -Appreciate cardiology assistance  -Continue strict I/Os  -positive fluid balance for the hospitalization  -Saline lock IV fluids  Diabetes mellitus type 2  -CBGs have been well controlled  -Monitor CBGs as patient has worsening renal failure may increase half-life of insulin -Patient continues to have poor by mouth intake  -Hemoglobin A1c 7.6 on September 14  Hypothyroidism  -Increased Synthroid to 88 mcg daily  Atrial fib  -does not appear to be good warfarin candidate due to falls, poor insight  -ASA  after surgeries  Severe protein calorie malnutrition  -Pre-albumin 4.0  -beneprotein has been started  -Glucerna supplement has been started  Anemia of CKD  -Iron saturation 11%  -Iron dosing per nephrology  Family Communication: family at beside  Disposition Plan: SNF when medically stable   Antibiotics:  Vancomycin September 14>>> September 20 Zosyn September 14>>> September 24 Daptomycin September 24>>>      Procedures/Studies: Dg Chest 2 View  05/15/2012  *RADIOLOGY REPORT*  Clinical Data: Fever.  CHEST - 2 VIEW  Comparison: 01/24/2012.  Findings: The pacer wires are stable.  The cardiac silhouette, mediastinal and hilar contours are mildly prominent but unchanged. There is persistent central vascular congestion and possible mild interstitial edema.  No pleural effusion or focal infiltrate.  The bony thorax is intact.  IMPRESSION: Stable cardiac enlargement and central vascular congestion with possible mild interstitial edema.   Original Report Authenticated By: P. Loralie Champagne, M.D.    Ct Tibia Fibula Left W Contrast  05/27/2012  *RADIOLOGY REPORT*  Clinical Data:  Diabetic with foot pain, erythema and swelling. Possible abscess.  CT OF THE LEFT LOWER LEG WITH CONTRAST  Technique:  Multidetector CT imaging of the left lower leg was performed according to the standard protocol following intravenous contrast administration. Multiplanar CT image reconstructions were also generated.  Contrast:  80 ml Omnipaque-300  Comparison:  Foot radiographs 05/15/2012 and today.  Findings:  Imaging was performed from the proximal left lower leg through the left foot as specifically requested.  There is an ill-defined peripherally enhancing fluid collection within the fourth webspace of the foot.  This contains a small air bubble and measures up to 2.1 cm in greatest dimension. Fluid extends distally into the fourth toe.  There is some additional soft tissue emphysema tracking medially within the  plantar forefoot within the plantar aspects of the second, third and fourth toes. No other measurable fluid collections are seen.  There is subcutaneous edema throughout the foot to extending into the distal lower leg.  Superficial varicosities are noted within the lower leg.  Bone windows demonstrate no cortical destruction or significant arthropathic changes.  IMPRESSION:  1.  There is an ill-defined fluid collection within the fourth webspace with distal extension into the fourth toe and along the plantar aspects of the second and third toes.  This collection demonstrates peripheral enhancement and air bubbles and is consistent with an abscess. 2.  No CT evidence of osteomyelitis. 3.  Nonspecific subcutaneous edema in the distal lower leg and plantar foot.   Original Report Authenticated By: [name], M.D.    US Renal  05/31/2012  *RADIOLOGY REPORT*  Clinical Data: Acute on chronic renal failure  RENAL/URINARY TRACT ULTRASOUND COMPLETE  Comparison:  Abdominal ultrasound dated 11/15/2009  Findings:  Right Kidney:  Measures 10.2 cm.  Echogenic renal parenchyma, likely reflecting medical renal disease.  No mass or hydronephrosis.  Left Kidney:  Measures 9.9 cm.  Echogenic renal parenchyma, likely reflecting medical renal disease.  Bladder:  Decompressed by indwelling Foley catheter.  IMPRESSION: Echogenic renal parenchyma, likely reflecting medical renal disease.  No hydronephrosis.  Bladder decompressed by indwelling Foley catheter.   Original Report Authenticated By: Charline Bills, M.D.    Dg Chest Port 1 View  05/29/2012  *RADIOLOGY REPORT*  Clinical Data: Evaluate for congestive heart failure.  PORTABLE CHEST - 1 VIEW  Comparison: Chest x-ray 05/25/2012.  Findings: Lung volumes are low.  No consolidative airspace disease. Probable trace bilateral pleural effusions.  Pulmonary venous congestion without frank pulmonary edema.  Mild cardiomegaly is unchanged. The patient is rotated to the left on  today's exam, resulting in distortion of the mediastinal contours and reduced diagnostic sensitivity and specificity for mediastinal pathology. Atherosclerosis of the thoracic aorta. There is a right upper extremity PICC with tip terminating in the distal superior vena cava. Left-sided pacemaker device in place with lead tips projecting over the expected location of the right atrium and right ventricular apex.  IMPRESSION: 1.  Mild cardiomegaly with pulmonary venous congestion, but no frank pulmonary edema. 2.  Trace bilateral pleural effusions. 3.  Atherosclerosis. 4.  Support apparatus, as above.   Original Report Authenticated By: Florencia Reasons, M.D.    Dg Chest Port 1 View  05/25/2012  *RADIOLOGY REPORT*  Clinical Data: PICC line placement.  PORTABLE CHEST - 1 VIEW  Comparison: 05/21/2012  Findings: Right-sided PICC line noted with tip projecting over the SVC.  No pneumothorax or complicating feature.  Dual lead pacer noted.  There is mild cardiomegaly along with stable interstitial accentuation.  Peripheral airspace opacity in the right mid lung appears slightly less striking.  Atherosclerotic calcification in the aortic arch noted.  IMPRESSION:  1.  Right PICC line tip:  SVC.  No pneumothorax or complicating feature. 2.  Cardiomegaly with mild interstitial accentuation, suspicious for mild interstitial edema. 3.  Faint airspace disease peripherally in the right mid lung appears improved.   Original Report Authenticated By: [name], M.D.    Dg Foot 2 Views Left  05/27/2012  *RADIOLOGY REPORT*  Clinical Data: Diabetic foot infection  LEFT FOOT - 2 VIEW  Comparison: 05/15/2012  Findings: Scattered soft tissue swelling. Diffuse osseous demineralization. Foci of soft tissue gas identified between the bases of the fourth and fifth toes. Joint spaces preserved. Hallux valgus.  No acute fracture, dislocation or bone destruction.  IMPRESSION: Soft tissue gas between the bases of the fourth and  fifth toes consistent with soft tissue infection. No definite evidence of osteomyelitis. If further soft tissue assessment needed or persistent clinical concern for occult osteomyelitis remains, consider MR imaging of the left foot.   Original Report Authenticated By: [name], M.D.    Dg Foot Complete Left  05/15/2012  *RADIOLOGY REPORT*  Clinical Data: Diabetic foot ulcers.  LEFT FOOT - COMPLETE 3+ VIEW  Comparison: None  Findings: The joint spaces are maintained.  No acute bony findings or destructive bony changes.  IMPRESSION: No plain film findings for osteomyelitis.   Original Report Authenticated By: P. Loralie Champagne, M.D.          Subjective:   Objective: Filed Vitals:   05/31/12 0645 05/31/12 1328 06/01/12 0504 06/01/12 1300  BP: 116/62 110/48 120/49 112/41  Pulse: 70 70 67 70  Temp: 97.3 F (36.3 C) 98.6 F (37 C) 99.2 F (37.3 C) 98.6 F (37 C)  TempSrc: Axillary Oral Oral Oral  Resp: 18 18 18 16   Height:      Weight: 78.2 kg (172 lb 6.4 oz)  78.7 kg (173 lb 8 oz)   SpO2: 97% 94% 94% 98%    Intake/Output Summary (Last 24 hours) at 06/01/12 1721 Last data filed at 06/01/12 1500  Gross per 24 hour  Intake    530 ml  Output    400 ml  Net    130 ml   Weight change: 0.5 kg (1 lb 1.6 oz) Exam:   General:  Pt is alert, follows commands appropriately, not in acute distress  HEENT: No icterus, No thrush, No neck mass, [place]/AT  Cardiovascular: RRR, S1/S2, no rubs, no gallops  Respiratory: Clear to auscultation bilaterally, no wheezing, no crackles, no rhonchi  Abdomen: Soft/+BS, non tender, non distended, no guarding  Extremities: No edema, No lymphangitis, No petechiae, No rashes, no synovitis  Data Reviewed: Basic Metabolic Panel:  Lab 06/01/12 1610 05/31/12 1437 05/31/12 0610 05/30/12 0545 05/29/12 0540 05/28/12 0500  NA 134* -- 133* 134* 132* 138  K 4.6 -- 4.3 4.5 4.5 4.4  CL 95* -- 93* 96 95* 101  CO2 31 -- 31 30 31 31   GLUCOSE 128* -- 157* 186*  206* 191*  BUN 41* -- 34* 29* 23 20  CREATININE 4.18* -- 3.43* 2.88* 2.10* 1.63*  CALCIUM 9.0 -- 9.0 9.0 8.8 8.9  MG -- -- -- -- -- --  PHOS 5.2* 4.7* -- -- -- --   Liver Function Tests:  Lab 06/01/12 0532  AST --  ALT --  ALKPHOS --  BILITOT --  PROT --  ALBUMIN 1.9*   No results found for this basename: LIPASE:5,AMYLASE:5 in the last 168 hours No results found for this basename: AMMONIA:5 in the last 168 hours CBC:  Lab 06/01/12 0532 05/31/12 0610 05/30/12 0545 05/29/12 0540 05/28/12 0500  WBC 11.8* 10.9* 11.4* 14.1* 16.6*  NEUTROABS -- -- -- -- --  HGB 8.7* 8.8* 8.9* 9.1* 9.5*  HCT 28.3* 27.9* 28.2* 29.3* 30.3*  MCV 91.3 90.9 91.0 91.0 90.7  PLT 336 332 354 341 330   Cardiac Enzymes: No results found for this basename: CKTOTAL:5,CKMB:5,CKMBINDEX:5,TROPONINI:5 in the last 168 hours BNP: No components found with this basename: POCBNP:5 CBG:  Lab 06/01/12 1627 06/01/12 1156 06/01/12 0735 05/31/12 2207 05/31/12 1837  GLUCAP 147* 116* 118* 173* 138*    Recent Results (from the past  240 hour(s))  CULTURE, BLOOD (ROUTINE X 2)     Status: Normal   Collection Time   05/23/12  8:00 PM      Component Value Range Status Comment   Specimen Description BLOOD LEFT HAND   Final    Special Requests BOTTLES DRAWN AEROBIC ONLY 10CC   Final    Culture  Setup Time 05/24/2012 14:17   Final    Culture NO GROWTH 5 DAYS   Final    Report Status 05/30/2012 FINAL   Final   CULTURE, BLOOD (ROUTINE X 2)     Status: Normal   Collection Time   05/23/12  8:14 PM      Component Value Range Status Comment   Specimen Description BLOOD RIGHT HAND   Final    Special Requests BOTTLES DRAWN AEROBIC ONLY 10CC   Final    Culture  Setup Time 05/24/2012 14:18   Final    Culture NO GROWTH 5 DAYS   Final    Report Status 05/30/2012 FINAL   Final   CULTURE, ROUTINE-ABSCESS     Status: Normal   Collection Time   05/28/12 10:58 AM      Component Value Range Status Comment   Specimen Description ABSCESS  FOOT LEFT   Final    Special Requests NONE   Final    Gram Stain     Final    Value: NO WBC SEEN     NO SQUAMOUS EPITHELIAL CELLS SEEN     NO ORGANISMS SEEN   Culture     Final    Value: FEW METHICILLIN RESISTANT STAPHYLOCOCCUS AUREUS     Note: RIFAMPIN AND GENTAMICIN SHOULD NOT BE USED AS SINGLE DRUGS FOR TREATMENT OF STAPH INFECTIONS. CRITICAL RESULT CALLED TO, READ BACK BY AND VERIFIED WITH: PATTY MOSS 06/01/12 1010 BY [email or web address]   Report Status 06/01/2012 FINAL   Final    Organism ID, Bacteria METHICILLIN RESISTANT STAPHYLOCOCCUS AUREUS   Final      Scheduled Meds:   . DAPTOmycin (CUBICIN)  IV  6 mg/kg Intravenous Q48H  . docusate sodium  100 mg Oral BID  . enoxaparin (LOVENOX) injection  30 mg Subcutaneous Q24H  . feeding supplement  237 mL Oral TID BM  . ferumoxytol  1,020 mg Intravenous Once  . insulin aspart  0-5 Units Subcutaneous QHS  . insulin aspart  0-9 Units Subcutaneous TID WC  . insulin glargine  11 Units Subcutaneous QHS  . levothyroxine  88 mcg Oral QAC breakfast  . oxyCODONE  10 mg Oral Q12H  . protein supplement  1 scoop Oral TID WC  . senna  2 tablet Oral QHS  . sertraline  100 mg Oral Daily  . Travoprost (BAK Free)  1 drop Both Eyes QHS  . vitamin C  1,000 mg Oral Daily  . DISCONTD: piperacillin-tazobactam (ZOSYN)  IV  2.25 g Intravenous Q6H   Continuous Infusions:    Jackalynn Art, DO  [facility] Pager [phone]  If 7PM-7AM, please contact night-coverage [email or web address] Password [email or web address] 06/01/2012, 5:21 PM   LOS: 11 days

## 2012-06-01 NOTE — H&P (View-Only) (Signed)
Pt without complaint  Filed Vitals:   05/30/12 1430 05/30/12 2202 05/31/12 0645 05/31/12 1328  BP: 109/51 137/52 116/62 110/48  Pulse: 68 70 70 70  Temp: 98.7 F (37.1 C) 98.5 F (36.9 C) 97.3 F (36.3 C) 98.6 F (37 C)  TempSrc: Oral Oral Axillary Oral  Resp: 18 19 18 18  Height:      Weight:   172 lb 6.4 oz (78.2 kg)   SpO2: 95% 98% 97% 94%   Left foot clean wound but not healing  Cr 3.88  Ideally would like stable renal function preop.  However her foot is not going to last without revascularization.  Will proceed with bypass in am  Plan discussed with pt and daughter.  Brand Siever, MD [facility] [place] Office: [phone] Pager: [phone]  

## 2012-06-01 NOTE — Progress Notes (Signed)
Pt current renal function discussed with Dr Darrick Penna.  Her creatinine continues to rise.  We feel best course is to delay fem pop until her renal function is stable.   This may take several days to weeks.  Unfortunately her foot may not hang on that long.  We will place a VAC today.  Continue to follow renal function.  If foot continues to deteriorate will need BKA otherwise Fem pop when renal function stable.  Will discuss with family later today.  Fabienne Bruns, MD [facility] [place] Office: [phone] Pager: [phone]

## 2012-06-01 NOTE — Interval H&P Note (Signed)
History and Physical Interval Note:  06/01/2012 7:41 AM  Lilit R Redler  has presented today for surgery, with the diagnosis of PVD  The various methods of treatment have been discussed with the patient and family. After consideration of risks, benefits and other options for treatment, the patient has consented to  Procedure(s) (LRB) with comments: BYPASS GRAFT FEMORAL-POPLITEAL ARTERY (Right) as a surgical intervention .  The patient's history has been reviewed, patient examined, no change in status, stable for surgery.  I have reviewed the patient's chart and labs.  Questions were answered to the patient's satisfaction.     FIELDS,CHARLES E

## 2012-06-02 ENCOUNTER — Inpatient Hospital Stay (HOSPITAL_COMMUNITY): Payer: Medicare Other

## 2012-06-02 DIAGNOSIS — N179 Acute kidney failure, unspecified: Secondary | ICD-10-CM

## 2012-06-02 LAB — CBC
HCT: 28.4 % — ABNORMAL LOW (ref 36.0–46.0)
Hemoglobin: 9 g/dL — ABNORMAL LOW (ref 12.0–15.0)
MCH: 29.1 pg (ref 26.0–34.0)
MCV: 91.9 fL (ref 78.0–100.0)
RBC: 3.09 MIL/uL — ABNORMAL LOW (ref 3.87–5.11)

## 2012-06-02 LAB — GLUCOSE, CAPILLARY
Glucose-Capillary: 192 mg/dL — ABNORMAL HIGH (ref 70–99)
Glucose-Capillary: 209 mg/dL — ABNORMAL HIGH (ref 70–99)
Glucose-Capillary: 167 mg/dL — ABNORMAL HIGH (ref 70–99)

## 2012-06-02 LAB — RENAL FUNCTION PANEL
BUN: 49 mg/dL — ABNORMAL HIGH (ref 6–23)
CO2: 29 mEq/L (ref 19–32)
Calcium: 9.2 mg/dL (ref 8.4–10.5)
Chloride: 94 mEq/L — ABNORMAL LOW (ref 96–112)
Creatinine, Ser: 4.72 mg/dL — ABNORMAL HIGH (ref 0.50–1.10)

## 2012-06-02 MED ORDER — LIDOCAINE-PRILOCAINE 2.5-2.5 % EX CREA: 1.0000 "application " | TOPICAL_CREAM | CUTANEOUS | Status: DC | PRN

## 2012-06-02 MED ORDER — SODIUM CHLORIDE 0.9 % IV SOLN: 100.0000 mL | INTRAVENOUS | Status: DC | PRN

## 2012-06-02 MED ORDER — HEPARIN SODIUM (PORCINE) 1000 UNIT/ML DIALYSIS: 40.0000 [IU]/kg | Freq: Once | INTRAMUSCULAR | Status: DC

## 2012-06-02 MED ORDER — LIDOCAINE HCL (PF) 1 % IJ SOLN: 5.0000 mL | INTRAMUSCULAR | Status: DC | PRN

## 2012-06-02 MED ORDER — NEPRO/CARBSTEADY PO LIQD
237.0000 mL | ORAL | Status: DC | PRN
  Filled 2012-06-02: qty 237

## 2012-06-02 MED ORDER — HEPARIN SODIUM (PORCINE) 1000 UNIT/ML DIALYSIS: 1000.0000 [IU] | INTRAMUSCULAR | Status: DC | PRN

## 2012-06-02 MED ORDER — PENTAFLUOROPROP-TETRAFLUOROETH EX AERO: 1.0000 "application " | INHALATION_SPRAY | CUTANEOUS | Status: DC | PRN

## 2012-06-02 MED ORDER — ALTEPLASE 2 MG IJ SOLR
2.0000 mg | Freq: Once | INTRAMUSCULAR | Status: AC | PRN
  Filled 2012-06-02: qty 2

## 2012-06-02 NOTE — Progress Notes (Signed)
Daily Renal Progress Note  Subjective:   Patient has increased confusion this morning, per daughter. No acute events overnight. Bypass surgery was canceled yesterday due to her renal function.   Objective:   BP 123/50  Pulse 70  Temp 99.1 F (37.3 C) (Oral)  Resp 18  Ht 5\' 7"  (1.702 m)  Wt 173 lb 8 oz (78.7 kg)  BMI 27.17 kg/m2  SpO2 95%  Intake/Output Summary (Last 24 hours) at 06/02/12 0649 Last data filed at 06/02/12 0500  Gross per 24 hour  Intake    530 ml  Output    205 ml  Net    325 ml   Weight change:   Physical Exam: Gen: Sleeping in bed, comfortable. CVS: RRR, no murmur Resp: ?crackles at bases. Unlabored breathing ZOX:WRUE, nontender. Dependent edema of flanks Ext: Diffusely edematous  Imaging: US Renal  05/31/2012  *RADIOLOGY REPORT*  Clinical Data: Acute on chronic renal failure  RENAL/URINARY TRACT ULTRASOUND COMPLETE  Comparison:  Abdominal ultrasound dated 11/15/2009  Findings:  Right Kidney:  Measures 10.2 cm.  Echogenic renal parenchyma, likely reflecting medical renal disease.  No mass or hydronephrosis.  Left Kidney:  Measures 9.9 cm.  Echogenic renal parenchyma, likely reflecting medical renal disease.  Bladder:  Decompressed by indwelling Foley catheter.  IMPRESSION: Echogenic renal parenchyma, likely reflecting medical renal disease.  No hydronephrosis.  Bladder decompressed by indwelling Foley catheter.   Original Report Authenticated By: Charline Bills, M.D.    Labs: BMET  Lab 06/02/12 0532 06/01/12 0532 05/31/12 1437 05/31/12 0610 05/30/12 0545 05/29/12 0540 05/28/12 0500 05/27/12 0500  NA 134* 134* -- 133* 134* 132* 138 135  K 4.9 4.6 -- 4.3 4.5 4.5 4.4 3.5  CL 94* 95* -- 93* 96 95* 101 99  CO2 29 31 -- 31 30 31 31 31   GLUCOSE 219* 128* -- 157* 186* 206* 191* 125*  BUN 49* 41* -- 34* 29* 23 20 18   CREATININE 4.72* 4.18* -- 3.43* 2.88* 2.10* 1.63* 1.19*  ALB -- -- -- -- -- -- -- --  CALCIUM 9.2 9.0 -- 9.0 9.0 8.8 8.9 8.8  PHOS 6.3* 5.2*  4.7* -- -- -- -- --   CBC  Lab 06/02/12 0532 06/01/12 0532 05/31/12 0610 05/30/12 0545  WBC 13.6* 11.8* 10.9* 11.4*  NEUTROABS -- -- -- --  HGB 9.0* 8.7* 8.8* 8.9*  HCT 28.4* 28.3* 27.9* 28.2*  MCV 91.9 91.3 90.9 91.0  PLT 362 336 332 354    Medications:      . DAPTOmycin (CUBICIN)  IV  6 mg/kg Intravenous Q48H  . docusate sodium  100 mg Oral BID  . enoxaparin (LOVENOX) injection  30 mg Subcutaneous Q24H  . feeding supplement  237 mL Oral TID BM  . ferumoxytol  1,020 mg Intravenous Once  . insulin aspart  0-5 Units Subcutaneous QHS  . insulin aspart  0-9 Units Subcutaneous TID WC  . insulin glargine  11 Units Subcutaneous QHS  . levothyroxine  88 mcg Oral QAC breakfast  . oxyCODONE  10 mg Oral Q12H  . protein supplement  1 scoop Oral TID WC  . senna  2 tablet Oral QHS  . sertraline  100 mg Oral Daily  . Travoprost (BAK Free)  1 drop Both Eyes QHS  . vitamin C  1,000 mg Oral Daily  . DISCONTD: piperacillin-tazobactam (ZOSYN)  IV  2.25 g Intravenous Q6H    Assessment/ Plan:   Pt is a 78 y.o. yo female with a PMHX of HTN,  DM, CAD, A.fib, PVD, CKD with baseline Cr 1.0-1.3 who was transferred to [facility] on 05/21/2012 from [facility] for evaluation of worsening diabetic foot wound, now s/p I&D of abscess and amputation of digits 3,4,5.   ARF- Patient with CKD, now with elevated creatinine. Differential includes volume overload, drug toxicity with supertherapeutic vanc level, but most likely due to contrasted study in setting of CKD and DM. Her urine output is decreasing. Her electrolytes are stable. She is not acidotic at this time. Patient has developed mental status changes. She is unable to touch her finger to nose due to weakness and a noticeable jerk. Discussed with daughter - UA unremarkable. FENa 1.13   - Strict I/O's  - Avoid further contrast and nephrotoxic drugs including NSAIDs. Would also recommend postponing large surgical procedures for now as well - Renal panel in  the AM  - Hep lock IV to decrease any extra fluids - Will begin hemodialysis today. Dr. Darrick Penna will place femoral cath today and start treatment. Daughter agrees.  Anemia- Appears she does not have chronic anemia, but her HgB has been trending down post-operatively. Iron is low at 23 with sat of 11 - Will order Feraheme x1 now - Monitor HgB daily   Foot infection- Possible osteomyelitis based on clinical findings. VSS following, and patient to OR 9/20 for debridement as well as amputation of digits.  - Continue Dapto, per primary team  - Consider rechecking Vanc trough in a few days to confirm clearance   CHF/A.fib- Followed by Cardiology.  - Rate is controlled at this time with pacemaker   PAD- VSS planning for bypass today, which has now been postponed.   DM- Per primary team. A1c 7.6. CBG controlled with SSI at this time   Nutrition- Albumin low at 1.9 today, and has been low for a while  - Continue nutrition supplementation  - Consider nutrition consult   Dispo- Closely monitor creatinine trend as it is now going up. Will begin HD today.  DVT PPX - Lovenox, per primary team  Brittney Pickle, MD 06/02/2012, 6:49 AM  I have seen and examined this patient and agree with the plan of care  Seen and eval. Progressive uremia and AKI, will do acute HD.  Discussed with family .  Brittney [name] L 06/02/2012, 11:49 AM

## 2012-06-02 NOTE — Progress Notes (Addendum)
Patient to HD for femoral catheter and HD today.

## 2012-06-02 NOTE — Progress Notes (Signed)
Creatinine, BUN, Phos all continue to rise Will continue VAC on foot and consider bypass when renal function stable Will recheck wound early next week If leg/foot deteriorates may need BKA  Fabienne Bruns, MD [facility] [place] Office: [phone] Pager: [phone]

## 2012-06-02 NOTE — Procedures (Signed)
Hemodialysis Catheter Insertion Procedure Note Brittney [name] [id number] 1932-03-28  Procedure: Insertion of Hemodialysis Catheter Indications: Dialysis Access   Procedure Details Consent: Risks of procedure as well as the alternatives and risks of each were explained to the (patient/caregiver).  Consent for procedure obtained. Time Out: Verified patient identification, verified procedure, site/side was marked, verified correct patient position, special equipment/implants available, medications/allergies/relevent history reviewed, required imaging and test results available.  Performed  Maximum sterile technique was used including antiseptics, cap, gloves, gown, hand hygiene, mask and sheet. Skin prep: Chlorhexidine; local anesthetic administered Triple lumen hemodialysis catheter was inserted into right femoral vein due to patient being a dialysis patient using the Seldinger technique. 20 cm Tialysis  Evaluation Blood flow good Complications: No apparent complications Patient did tolerate procedure well. Chest X-ray ordered to verify placement.  CXR: not indic.   Brittney [name] 06/02/2012

## 2012-06-02 NOTE — Progress Notes (Signed)
Patient ID: Brittney [name], female   DOB: 10-26-1931, 78 y.o.   MRN: [id number]   Unfortunately creatinine continues to climb. It is 4.7 today. The patient is being followed carefully by nephrology. There is nothing I can add from the cardiology viewpoint today.  Jerral Bonito, MD

## 2012-06-02 NOTE — Progress Notes (Signed)
Clinical Social Work  CSW attempted to meet with patient but patient out of room for procedure. CSW encouraged RN to talk with patient and dtr again regarding patient's high level of care and need for SNF at dc. CSW will follow up at later time.   [place], [place] [phone]

## 2012-06-02 NOTE — Progress Notes (Signed)
Patient to HD for femoral catheter placement and HD

## 2012-06-02 NOTE — Progress Notes (Signed)
[facility]                                                                                Patient Demographics  Brittney [name], is a 78 y.o. female  MVH:[id number]  [id number]:[id number]  DOB - 16-Feb-1932  Admit date - 05/21/2012  Admitting Physician Brittney Pray, MD  Outpatient Primary MD for the patient is FANTA,TESFAYE, MD  LOS - 12   No chief complaint on file.       Assessment & Plan    Acute on chronic CKD stage III  -Appreciate nephrology input , a slight creatinine around 1.3. Could have some component of vancomycin toxicity, urine E. structures were negative. -Renal ultrasound (9/23) negative for hydronephrosis, consistent with medical renal disease  Restarted per renal on 06/02/2012 through femoral dialysis catheter      Diabetic foot infection/abscess/osteomyelitis foot along with severe PAD  -Surgical intraoperative culture showed MRSA  -Due to concerns with vancomycin and nephrotoxicity, start daptomycin 6 mg/kg q. 48 hours (adjusted for renal failure)  -Check CPK q. 7 days while on daptomycin  -Last vancomycin dose September 20  -Plan 42 days of antibiotics unless patient has BKA (abx start September 14)  -MRI not possible due to the patient's pacemaker  -osteomyelitis due to her surgical findings on September 20  -pus extended to metatarsals as noted during intra-op findings-->therefore, she clinically has osteomyelitis  -Pathology of surgical specimen consistent with acute osteomyelitis  -CT of the foot reveals abscess (9/19)  - Appreciate Dr. Darrick [name], s/p I&D on 9/20       Severe peripheral vascular disease  -Revascularization delayed per Dr. Darrick [name], appreciate followup , she will be a high-risk candidate for the surgery.      Acute on chronic Diastolic CHF  -Appreciate cardiology assistance  -Continue strict I/Os  -positive fluid balance for the hospitalization  -Saline lock IV fluids  -Dialysis is to be started on 925 for  excess fluid removal     Diabetes mellitus type 2  -CBGs have been well controlled  -Monitor CBGs as patient has worsening renal failure may increase half-life of insulin  -Patient continues to have poor by mouth intake  -Hemoglobin A1c 7.6 on September 14    CBG (last 3)   Basename 06/02/12 0736 06/01/12 2138 06/01/12 1627  GLUCAP 209* 192* 147*      Hypothyroidism  Increased Synthroid to 88 mcg daily   Lab Results  Component Value Date   TSH 5.714* 05/27/2012      Atrial fib  -does not appear to be good warfarin candidate due to falls, poor insight, and he has paced rhythm on the admission EKG. -ASA after surgeries , goal rate controlled     Severe protein calorie malnutrition  -Pre-albumin 4.0  -beneprotein has been started  -Glucerna supplement has been started      Anemia of CKD  -Iron saturation 11%  -Iron dosing per nephrology       Code Status: Full for now DO NOT RESUSCITATE if worsens per daughter  Family Communication: Discussed poor prognosis with her daughter and patient, daughter which is then non-heroic treatment, wishes that if patient declines focus on comfort, no  long-term life support  Disposition Plan: Be decided     Procedures femoral dialysis catheter placement on 06/02/2012 for starting of dialysis   Consults  [facility] Surgery, renal   Time Spent in minutes   35   Antibiotics     Anti-infectives     Start     Dose/Rate Route Frequency Ordered Stop   06/01/12 1800   DAPTOmycin (CUBICIN) 472 mg in sodium chloride 0.9 % IVPB     Comments: Indication--MRSA osteomyelitis      6 mg/kg  78.7 kg 218.9 mL/hr over 30 Minutes Intravenous Every 48 hours 06/01/12 1700     05/29/12 1200   piperacillin-tazobactam (ZOSYN) IVPB 2.25 g  Status:  Discontinued        2.25 g 100 mL/hr over 30 Minutes Intravenous 4 times per day 05/29/12 0752 06/01/12 1707   05/27/12 1800   vancomycin (VANCOCIN) IVPB 1000 mg/200 mL premix  Status:   Discontinued        1,000 mg 200 mL/hr over 60 Minutes Intravenous Every 24 hours 05/27/12 0035 05/29/12 0742   05/23/12 2000   piperacillin-tazobactam (ZOSYN) IVPB 3.375 g  Status:  Discontinued        3.375 g 12.5 mL/hr over 240 Minutes Intravenous 3 times per day 05/23/12 1919 05/29/12 0752   05/23/12 0000   vancomycin (VANCOCIN) 750 mg in sodium chloride 0.9 % 150 mL IVPB        750 mg 150 mL/hr over 60 Minutes Intravenous Every 24 hours 05/22/12 1051 05/27/12 0218   05/22/12 0000   vancomycin (VANCOCIN) IVPB 1000 mg/200 mL premix  Status:  Discontinued        1,000 mg 200 mL/hr over 60 Minutes Intravenous Every 24 hours 05/21/12 2354 05/22/12 1051          Scheduled Meds:   . DAPTOmycin (CUBICIN)  IV  6 mg/kg Intravenous Q48H  . docusate sodium  100 mg Oral BID  . enoxaparin (LOVENOX) injection  30 mg Subcutaneous Q24H  . feeding supplement  237 mL Oral TID BM  . ferumoxytol  1,020 mg Intravenous Once  . heparin  40 Units/kg Dialysis Once in dialysis  . insulin aspart  0-5 Units Subcutaneous QHS  . insulin aspart  0-9 Units Subcutaneous TID WC  . insulin glargine  11 Units Subcutaneous QHS  . levothyroxine  88 mcg Oral QAC breakfast  . oxyCODONE  10 mg Oral Q12H  . protein supplement  1 scoop Oral TID WC  . senna  2 tablet Oral QHS  . sertraline  100 mg Oral Daily  . Travoprost (BAK Free)  1 drop Both Eyes QHS  . vitamin C  1,000 mg Oral Daily  . DISCONTD: piperacillin-tazobactam (ZOSYN)  IV  2.25 g Intravenous Q6H   Continuous Infusions:  PRN Meds:.sodium chloride, sodium chloride, acetaminophen, alteplase, feeding supplement (NEPRO CARB STEADY), fentaNYL, heparin, HYDROmorphone (DILAUDID) injection, lidocaine, lidocaine-prilocaine, ondansetron (ZOFRAN) IV, ondansetron (ZOFRAN) IV, ondansetron, oxyCODONE, pentafluoroprop-tetrafluoroeth, sodium chloride, sodium chloride, zolpidem   DVT Prophylaxis  Lovenox    Susa Raring K M.D on 06/02/2012 at 1:32  PM  Between 7am to 7pm - Pager - [phone]  After 7pm go to [email or web address] - password [email or web address]  And look for the night coverage person covering for me after hours  [facility] Office  [phone]    Subjective:   Brittney [name] today has, No headache, No chest pain, No abdominal pain - No Nausea, No new weakness tingling or numbness, does have some cough  and mild shortness of breath.  Objective:   Filed Vitals:   06/02/12 1122 06/02/12 1245 06/02/12 1257 06/02/12 1330  BP:  118/36 118/59   Pulse:  70 70 70  Temp:  98.4 F (36.9 C)    TempSrc:  Oral    Resp:  16 12 12   Height:      Weight: 84 kg (185 lb 3 oz) 84 kg (185 lb 3 oz)    SpO2:  96%      Wt Readings from Last 3 Encounters:  06/02/12 84 kg (185 lb 3 oz)  06/02/12 84 kg (185 lb 3 oz)  06/02/12 84 kg (185 lb 3 oz)     Intake/Output Summary (Last 24 hours) at 06/02/12 1332 Last data filed at 06/02/12 0500  Gross per 24 hour  Intake    240 ml  Output    205 ml  Net     35 ml    Exam Awake Alert, Oriented X 3, No new F.N deficits, Normal affect [place].AT,PERRAL Supple Neck,No JVD, No cervical lymphadenopathy appriciated.  Symmetrical Chest wall movement, Good air movement bilaterally, CTAB RRR,No Gallops,Rubs or new Murmurs, No Parasternal Heave +ve B.Sounds, Abd Soft, Non tender, No organomegaly appriciated, No rebound - guarding or rigidity. No Cyanosis, Clubbing or edema, No new Rash or bruise, has wound VAC in her left foot   Data Review   Micro Results Recent Results (from the past 240 hour(s))  CULTURE, BLOOD (ROUTINE X 2)     Status: Normal   Collection Time   05/23/12  8:00 PM      Component Value Range Status Comment   Specimen Description BLOOD LEFT HAND   Final    Special Requests BOTTLES DRAWN AEROBIC ONLY 10CC   Final    Culture  Setup Time 05/24/2012 14:17   Final    Culture NO GROWTH 5 DAYS   Final    Report Status 05/30/2012 FINAL   Final   CULTURE, BLOOD (ROUTINE X 2)      Status: Normal   Collection Time   05/23/12  8:14 PM      Component Value Range Status Comment   Specimen Description BLOOD RIGHT HAND   Final    Special Requests BOTTLES DRAWN AEROBIC ONLY 10CC   Final    Culture  Setup Time 05/24/2012 14:18   Final    Culture NO GROWTH 5 DAYS   Final    Report Status 05/30/2012 FINAL   Final   CULTURE, ROUTINE-ABSCESS     Status: Normal   Collection Time   05/28/12 10:58 AM      Component Value Range Status Comment   Specimen Description ABSCESS FOOT LEFT   Final    Special Requests NONE   Final    Gram Stain     Final    Value: NO WBC SEEN     NO SQUAMOUS EPITHELIAL CELLS SEEN     NO ORGANISMS SEEN   Culture     Final    Value: FEW METHICILLIN RESISTANT STAPHYLOCOCCUS AUREUS     Note: RIFAMPIN AND GENTAMICIN SHOULD NOT BE USED AS SINGLE DRUGS FOR TREATMENT OF STAPH INFECTIONS. CRITICAL RESULT CALLED TO, READ BACK BY AND VERIFIED WITH: PATTY MOSS 06/01/12 1010 BY SMITHERSJ   Report Status 06/01/2012 FINAL   Final    Organism ID, Bacteria METHICILLIN RESISTANT STAPHYLOCOCCUS AUREUS   Final     Radiology Reports Dg Chest 2 View  05/15/2012  *RADIOLOGY REPORT*  Clinical Data:  Fever.  CHEST - 2 VIEW  Comparison: 01/24/2012.  Findings: The pacer wires are stable.  The cardiac silhouette, mediastinal and hilar contours are mildly prominent but unchanged. There is persistent central vascular congestion and possible mild interstitial edema.  No pleural effusion or focal infiltrate.  The bony thorax is intact.  IMPRESSION: Stable cardiac enlargement and central vascular congestion with possible mild interstitial edema.   Original Report Authenticated By: P. Loralie Champagne, M.D.    Ct Tibia Fibula Left W Contrast  05/27/2012  *RADIOLOGY REPORT*  Clinical Data:  Diabetic with foot pain, erythema and swelling. Possible abscess.  CT OF THE LEFT LOWER LEG WITH CONTRAST  Technique:  Multidetector CT imaging of the left lower leg was performed according to the standard  protocol following intravenous contrast administration. Multiplanar CT image reconstructions were also generated.  Contrast:  80 ml Omnipaque-300  Comparison:  Foot radiographs 05/15/2012 and today.  Findings:  Imaging was performed from the proximal left lower leg through the left foot as specifically requested.  There is an ill-defined peripherally enhancing fluid collection within the fourth webspace of the foot.  This contains a small air bubble and measures up to 2.1 cm in greatest dimension. Fluid extends distally into the fourth toe.  There is some additional soft tissue emphysema tracking medially within the plantar forefoot within the plantar aspects of the second, third and fourth toes. No other measurable fluid collections are seen.  There is subcutaneous edema throughout the foot to extending into the distal lower leg.  Superficial varicosities are noted within the lower leg.  Bone windows demonstrate no cortical destruction or significant arthropathic changes.  IMPRESSION:  1.  There is an ill-defined fluid collection within the fourth webspace with distal extension into the fourth toe and along the plantar aspects of the second and third toes.  This collection demonstrates peripheral enhancement and air bubbles and is consistent with an abscess. 2.  No CT evidence of osteomyelitis. 3.  Nonspecific subcutaneous edema in the distal lower leg and plantar foot.   Original Report Authenticated By: [name], M.D.    US Renal  05/31/2012  *RADIOLOGY REPORT*  Clinical Data: Acute on chronic renal failure  RENAL/URINARY TRACT ULTRASOUND COMPLETE  Comparison:  Abdominal ultrasound dated 11/15/2009  Findings:  Right Kidney:  Measures 10.2 cm.  Echogenic renal parenchyma, likely reflecting medical renal disease.  No mass or hydronephrosis.  Left Kidney:  Measures 9.9 cm.  Echogenic renal parenchyma, likely reflecting medical renal disease.  Bladder:  Decompressed by indwelling Foley catheter.   IMPRESSION: Echogenic renal parenchyma, likely reflecting medical renal disease.  No hydronephrosis.  Bladder decompressed by indwelling Foley catheter.   Original Report Authenticated By: Charline Bills, M.D.    Dg Chest Port 1 View  05/29/2012  *RADIOLOGY REPORT*  Clinical Data: Evaluate for congestive heart failure.  PORTABLE CHEST - 1 VIEW  Comparison: Chest x-ray 05/25/2012.  Findings: Lung volumes are low.  No consolidative airspace disease. Probable trace bilateral pleural effusions.  Pulmonary venous congestion without frank pulmonary edema.  Mild cardiomegaly is unchanged. The patient is rotated to the left on today's exam, resulting in distortion of the mediastinal contours and reduced diagnostic sensitivity and specificity for mediastinal pathology. Atherosclerosis of the thoracic aorta. There is a right upper extremity PICC with tip terminating in the distal superior vena cava. Left-sided pacemaker device in place with lead tips projecting over the expected location of the right atrium and right ventricular apex.  IMPRESSION: 1.  Mild cardiomegaly with pulmonary venous congestion, but no frank pulmonary edema. 2.  Trace bilateral pleural effusions. 3.  Atherosclerosis. 4.  Support apparatus, as above.   Original Report Authenticated By: Florencia Reasons, M.D.    Dg Chest Port 1 View  05/25/2012  *RADIOLOGY REPORT*  Clinical Data: PICC line placement.  PORTABLE CHEST - 1 VIEW  Comparison: 05/21/2012  Findings: Right-sided PICC line noted with tip projecting over the SVC.  No pneumothorax or complicating feature.  Dual lead pacer noted.  There is mild cardiomegaly along with stable interstitial accentuation.  Peripheral airspace opacity in the right mid lung appears slightly less striking.  Atherosclerotic calcification in the aortic arch noted.  IMPRESSION:  1.  Right PICC line tip:  SVC.  No pneumothorax or complicating feature. 2.  Cardiomegaly with mild interstitial accentuation, suspicious  for mild interstitial edema. 3.  Faint airspace disease peripherally in the right mid lung appears improved.   Original Report Authenticated By: [name], M.D.    Dg Foot 2 Views Left  05/27/2012  *RADIOLOGY REPORT*  Clinical Data: Diabetic foot infection  LEFT FOOT - 2 VIEW  Comparison: 05/15/2012  Findings: Scattered soft tissue swelling. Diffuse osseous demineralization. Foci of soft tissue gas identified between the bases of the fourth and fifth toes. Joint spaces preserved. Hallux valgus. No acute fracture, dislocation or bone destruction.  IMPRESSION: Soft tissue gas between the bases of the fourth and fifth toes consistent with soft tissue infection. No definite evidence of osteomyelitis. If further soft tissue assessment needed or persistent clinical concern for occult osteomyelitis remains, consider MR imaging of the left foot.   Original Report Authenticated By: [name], M.D.    Dg Foot Complete Left  05/15/2012  *RADIOLOGY REPORT*  Clinical Data: Diabetic foot ulcers.  LEFT FOOT - COMPLETE 3+ VIEW  Comparison: None  Findings: The joint spaces are maintained.  No acute bony findings or destructive bony changes.  IMPRESSION: No plain film findings for osteomyelitis.   Original Report Authenticated By: P. Loralie Champagne, M.D.     CBC  Lab 06/02/12 0532 06/01/12 0532 05/31/12 0610 05/30/12 0545 05/29/12 0540  WBC 13.6* 11.8* 10.9* 11.4* 14.1*  HGB 9.0* 8.7* 8.8* 8.9* 9.1*  HCT 28.4* 28.3* 27.9* 28.2* 29.3*  PLT 362 336 332 354 341  MCV 91.9 91.3 90.9 91.0 91.0  MCH 29.1 28.1 28.7 28.7 28.3  MCHC 31.7 30.7 31.5 31.6 31.1  RDW 15.0 15.0 14.8 14.9 14.6  LYMPHSABS -- -- -- -- --  MONOABS -- -- -- -- --  EOSABS -- -- -- -- --  BASOSABS -- -- -- -- --  BANDABS -- -- -- -- --    Chemistries   Lab 06/02/12 0532 06/01/12 0532 05/31/12 0610 05/30/12 0545 05/29/12 0540  NA 134* 134* 133* 134* 132*  K 4.9 4.6 4.3 4.5 4.5  CL 94* 95* 93* 96 95*  CO2 29 31 31 30 31    GLUCOSE 219* 128* 157* 186* 206*  BUN 49* 41* 34* 29* 23  CREATININE 4.72* 4.18* 3.43* 2.88* 2.10*  CALCIUM 9.2 9.0 9.0 9.0 8.8  MG -- -- -- -- --  AST -- -- -- -- --  ALT -- -- -- -- --  ALKPHOS -- -- -- -- --  BILITOT -- -- -- -- --   ------------------------------------------------------------------------------------------------------------------ estimated creatinine clearance is 10.9 ml/min (by C-G formula based on Cr of 4.72). ------------------------------------------------------------------------------------------------------------------ No results found for this basename: HGBA1C:2 in the last 72 hours ------------------------------------------------------------------------------------------------------------------  No results found for this basename: CHOL:2,HDL:2,LDLCALC:2,TRIG:2,CHOLHDL:2,LDLDIRECT:2 in the last 72 hours ------------------------------------------------------------------------------------------------------------------ No results found for this basename: TSH,T4TOTAL,FREET3,T3FREE,THYROIDAB in the last 72 hours ------------------------------------------------------------------------------------------------------------------  [facility] 05/31/12 1437  VITAMINB12 1384*  FOLATE 12.3  FERRITIN 246  TIBC 214*  IRON 23*  RETICCTPCT 2.0    Coagulation profile  Lab 06/01/12 0532  INR 1.22  PROTIME --    No results found for this basename: DDIMER:2 in the last 72 hours  Cardiac Enzymes No results found for this basename: CK:3,CKMB:3,TROPONINI:3,MYOGLOBIN:3 in the last 168 hours ------------------------------------------------------------------------------------------------------------------ No components found with this basename: POCBNP:3

## 2012-06-02 NOTE — Procedures (Signed)
I was present at this session.  I have reviewed the session itself and made appropriate changes.  Brittney [name] L 9/25/20131:02 PM

## 2012-06-03 ENCOUNTER — Inpatient Hospital Stay (HOSPITAL_COMMUNITY): Payer: Medicare Other

## 2012-06-03 LAB — GLUCOSE, CAPILLARY
Glucose-Capillary: 197 mg/dL — ABNORMAL HIGH (ref 70–99)
Glucose-Capillary: 246 mg/dL — ABNORMAL HIGH (ref 70–99)
Glucose-Capillary: 311 mg/dL — ABNORMAL HIGH (ref 70–99)

## 2012-06-03 LAB — CBC
Hemoglobin: 8.9 g/dL — ABNORMAL LOW (ref 12.0–15.0)
MCH: 29 pg (ref 26.0–34.0)
MCHC: 31.8 g/dL (ref 30.0–36.0)
Platelets: 384 10*3/uL (ref 150–400)
RBC: 3.07 MIL/uL — ABNORMAL LOW (ref 3.87–5.11)

## 2012-06-03 LAB — BASIC METABOLIC PANEL
CO2: 27 mEq/L (ref 19–32)
Calcium: 9.1 mg/dL (ref 8.4–10.5)
GFR calc non Af Amer: 13 mL/min — ABNORMAL LOW (ref >90)
Glucose, Bld: 246 mg/dL — ABNORMAL HIGH (ref 70–99)
Potassium: 4.4 mEq/L (ref 3.5–5.1)
Sodium: 133 mEq/L — ABNORMAL LOW (ref 135–145)

## 2012-06-03 LAB — HEPATITIS B CORE ANTIBODY, TOTAL: Hep B Core Total Ab: NEGATIVE

## 2012-06-03 MED ORDER — HEPARIN SODIUM (PORCINE) 1000 UNIT/ML DIALYSIS
100.0000 [IU]/kg | INTRAMUSCULAR | Status: DC | PRN
  Filled 2012-06-03: qty 9

## 2012-06-03 MED ORDER — OXYCODONE HCL 5 MG PO TABS
ORAL_TABLET | ORAL | Status: AC
  Administered 2012-06-03: 5 mg via ORAL
  Filled 2012-06-03: qty 1

## 2012-06-03 MED ORDER — SODIUM CHLORIDE 0.9 % IV SOLN: 100.0000 mL | INTRAVENOUS | Status: DC | PRN

## 2012-06-03 MED ORDER — HEPARIN SODIUM (PORCINE) 1000 UNIT/ML DIALYSIS
1000.0000 [IU] | INTRAMUSCULAR | Status: DC | PRN
  Filled 2012-06-03: qty 1

## 2012-06-03 MED ORDER — ALTEPLASE 2 MG IJ SOLR
2.0000 mg | Freq: Once | INTRAMUSCULAR | Status: AC
  Administered 2012-06-03: 2 mg
  Filled 2012-06-03: qty 2

## 2012-06-03 MED ORDER — PENTAFLUOROPROP-TETRAFLUOROETH EX AERO: 1.0000 "application " | INHALATION_SPRAY | CUTANEOUS | Status: DC | PRN

## 2012-06-03 MED ORDER — ALTEPLASE 2 MG IJ SOLR
2.0000 mg | Freq: Once | INTRAMUSCULAR | Status: AC | PRN
  Filled 2012-06-03: qty 2

## 2012-06-03 MED ORDER — NEPRO/CARBSTEADY PO LIQD: 237.0000 mL | ORAL | Status: DC | PRN

## 2012-06-03 MED ORDER — LIDOCAINE HCL (PF) 1 % IJ SOLN: 5.0000 mL | INTRAMUSCULAR | Status: DC | PRN

## 2012-06-03 MED ORDER — LIDOCAINE-PRILOCAINE 2.5-2.5 % EX CREA: 1.0000 "application " | TOPICAL_CREAM | CUTANEOUS | Status: DC | PRN

## 2012-06-03 MED ORDER — HEPARIN SODIUM (PORCINE) 5000 UNIT/ML IJ SOLN
5000.0000 [IU] | Freq: Three times a day (TID) | INTRAMUSCULAR | Status: DC
  Administered 2012-06-03 – 2012-06-08 (×17): 5000 [IU] via SUBCUTANEOUS
  Filled 2012-06-03 (×20): qty 1

## 2012-06-03 MED ORDER — LINEZOLID 2 MG/ML IV SOLN
600.0000 mg | Freq: Two times a day (BID) | INTRAVENOUS | Status: DC
  Administered 2012-06-03 – 2012-06-07 (×8): 600 mg via INTRAVENOUS
  Filled 2012-06-03 (×11): qty 300

## 2012-06-03 NOTE — Progress Notes (Signed)
    SUBJECTIVE:  She denies any acute dyspnea.  She is lying flat.    PHYSICAL EXAM Filed Vitals:   06/02/12 1714 06/02/12 2213 06/03/12 0148 06/03/12 0511  BP: 133/42 102/31 118/46 116/51  Pulse: 65 70 70 70  Temp: 100.6 F (38.1 C) 99.9 F (37.7 C) 97.8 F (36.6 C) 97.3 F (36.3 C)  TempSrc: Axillary Axillary Oral Oral  Resp: 18 18 18 18   Height:      Weight:      SpO2: 93% 94% 96% 99%   General:  No distress Lungs:  Few basilar crackles Heart:  RRR Abdomen:  Positive bowel sounds, no rebound no guarding Extremities:  Diffuse non pitting upper extremity edema  LABS:  Results for orders placed during the hospital encounter of 05/21/12 (from the past 24 hour(s))  HEPATITIS B SURFACE ANTIBODY     Status: Abnormal   Collection Time   06/02/12  1:24 PM      Component Value Range   Hep B S Ab POSITIVE (*) NEGATIVE  HEPATITIS B SURFACE ANTIGEN     Status: Normal   Collection Time   06/02/12  1:24 PM      Component Value Range   Hepatitis B Surface Ag NEGATIVE  NEGATIVE  GLUCOSE, CAPILLARY     Status: Abnormal   Collection Time   06/02/12  5:08 PM      Component Value Range   Glucose-Capillary 167 (*) 70 - 99 mg/dL  GLUCOSE, CAPILLARY     Status: Abnormal   Collection Time   06/02/12  8:22 PM      Component Value Range   Glucose-Capillary 193 (*) 70 - 99 mg/dL   Comment 1 Notify RN    GLUCOSE, CAPILLARY     Status: Abnormal   Collection Time   06/02/12 11:40 PM      Component Value Range   Glucose-Capillary 197 (*) 70 - 99 mg/dL   Comment 1 Documented in Chart     Comment 2 Notify RN      Intake/Output Summary (Last 24 hours) at 06/03/12 0825 Last data filed at 06/03/12 2956  Gross per 24 hour  Intake    660 ml  Output   3200 ml  Net  -2540 ml    ASSESSMENT AND PLAN:   PVD She is being followed for the timing of possible revascularization pending the outcome of her renal function.  CKD She feels much better after her dialysis.  Plans per renal.  CAD  (coronary artery disease) No active ischemic issues.  CHF (congestive heart failure) I had a discussion with her family about volume management for heart failure with dialysis.  We will follow as needed. Please call for further questions.      Fayrene Fearing Janeliz [place] 06/03/2012 8:25 AM

## 2012-06-03 NOTE — Progress Notes (Signed)
Physical medicine and rehabilitation consult requested. Chart has been reviewed. Will await physical and occupational therapy evaluations to help to establish possible need for inpatient rehabilitation services. Also await plan on vascularization procedure per vascular surgery.

## 2012-06-03 NOTE — Consult Note (Signed)
WOC follow up Wound type: surgical wound, left great toe Wound bed: some early granulation tissue, has some slough/tissue necrosis at wound edges Drainage (amount, consistency, odor) none noted in canister at the time of the change today Periwound: slight maceration of wound edges, protected skin edges with skin prep today to tx.  Dressing procedure/placement/frequency: 1pc of black granufoam cut to fit into wound bed, drape applied and seal obtained at .  Pt tolerated well, received PO pain meds prior to dressing change.   Bedside nursing ok to change T/Th/Sat.  WOC will follow along for assistance with VAC as needed. Vickye Astorino [place], [place] [phone]

## 2012-06-03 NOTE — Progress Notes (Signed)
Daily Renal Progress Note  Subjective:   Patient went to HD yesterday; she does not remember the treatment. Right femoral line placed with no difficulty. 3.1L removed. Patient more alert this morning, no acute events overnight.   Objective:   BP 116/51  Pulse 70  Temp 97.3 F (36.3 C) (Oral)  Resp 18  Ht 5\' 7"  (1.702 m)  Wt 177 lb 11.1 oz (80.6 kg)  BMI 27.83 kg/m2  SpO2 99%  Intake/Output Summary (Last 24 hours) at 06/03/12 0801 Last data filed at 06/03/12 5621  Gross per 24 hour  Intake    660 ml  Output   3200 ml  Net  -2540 ml   Weight change:   Physical Exam: Gen: Awake in bed, comfortable. Talkative. CVS: RRR, no murmur Resp: CTAB. Unlabored breathing HYQ:MVHQ, nontender. Dependent edema of flanks GU: Right femoral line with dressing C/D/I Ext: Diffusely edematous. Wound vac left foot. Right forefoot surgically absent  Imaging: No results found. Labs: BMET  Lab 06/02/12 0532 06/01/12 0532 05/31/12 1437 05/31/12 0610 05/30/12 0545 05/29/12 0540 05/28/12 0500  NA 134* 134* -- 133* 134* 132* 138  K 4.9 4.6 -- 4.3 4.5 4.5 4.4  CL 94* 95* -- 93* 96 95* 101  CO2 29 31 -- 31 30 31 31   GLUCOSE 219* 128* -- 157* 186* 206* 191*  BUN 49* 41* -- 34* 29* 23 20  CREATININE 4.72* 4.18* -- 3.43* 2.88* 2.10* 1.63*  ALB -- -- -- -- -- -- --  CALCIUM 9.2 9.0 -- 9.0 9.0 8.8 8.9  PHOS 6.3* 5.2* 4.7* -- -- -- --   CBC  Lab 06/02/12 0532 06/01/12 0532 05/31/12 0610 05/30/12 0545  WBC 13.6* 11.8* 10.9* 11.4*  NEUTROABS -- -- -- --  HGB 9.0* 8.7* 8.8* 8.9*  HCT 28.4* 28.3* 27.9* 28.2*  MCV 91.9 91.3 90.9 91.0  PLT 362 336 332 354    Medications:       . DAPTOmycin (CUBICIN)  IV  6 mg/kg Intravenous Q48H  . docusate sodium  100 mg Oral BID  . enoxaparin (LOVENOX) injection  30 mg Subcutaneous Q24H  . feeding supplement  237 mL Oral TID BM  . heparin  40 Units/kg Dialysis Once in dialysis  . insulin aspart  0-5 Units Subcutaneous QHS  . insulin aspart  0-9 Units  Subcutaneous TID WC  . insulin glargine  11 Units Subcutaneous QHS  . levothyroxine  88 mcg Oral QAC breakfast  . oxyCODONE  10 mg Oral Q12H  . protein supplement  1 scoop Oral TID WC  . senna  2 tablet Oral QHS  . sertraline  100 mg Oral Daily  . Travoprost (BAK Free)  1 drop Both Eyes QHS  . vitamin C  1,000 mg Oral Daily    Assessment/ Plan:   Pt is a 78 y.o. yo female with a PMHX of HTN, DM, CAD, A.fib, PVD, CKD with baseline Cr 1.0-1.3 who was transferred to [facility] on 05/21/2012 from [facility] for evaluation of worsening diabetic foot wound, now s/p I&D of abscess and amputation of digits 3,4,5.   ARF- Patient with CKD, now with elevated creatinine. Differential includes volume overload, drug toxicity with supertherapeutic vanc level, but most likely due to contrasted study in setting of CKD and DM. Her urine output is decreasing; only 100cc recorded yesterday. Her electrolytes are stable. She is not acidotic at this time. Patient developed mental status changes yesterday, and was started on HD on 05/23/12 - UA unremarkable. FENa  1.13   - Strict I/O's  - Avoid further contrast and nephrotoxic drugs including NSAIDs. Would also recommend postponing large surgical procedures for now as well - Renal panel in the AM  - Hep lock IV to decrease any extra fluids - Will do another hemodialysis treatment tomorrow. Will discuss with Dr. Darrick Penna.  Anemia- Appears she does not have chronic anemia, but her HgB has been trending down post-operatively. Iron is low at 23 with sat of 11 - s/p feraheme - Monitor HgB daily   Foot infection- Possible osteomyelitis based on clinical findings. VSS following, and patient to OR 9/20 for debridement as well as amputation of digits.  - Continue Dapto, per primary team  - Consider rechecking Vanc trough in a few days to confirm clearance   CHF/A.fib- Followed by Cardiology.  - Rate is controlled at this time with pacemaker   PAD- VSS planning for  bypass, which has now been postponed.   DM- Per primary team. A1c 7.6. CBG controlled with SSI at this time   Nutrition- Albumin low at 1.9, and has been low for a while  - Continue nutrition supplementation  - Consider nutrition consult   Dispo- HD treatment tomorrow  DVT PPX - Lovenox, per primary team  [name], MD 06/03/2012, 8:01 AM I have seen and examined this patient and agree with the plan of care Seen and eval.  Will plan HD in am.  Vol and uremia better.  Plan limited trial for ARF, if not recover, stop .  Norah Devin L 06/03/2012, 12:45 PM

## 2012-06-03 NOTE — Progress Notes (Signed)
Clinical Social Work  MD reports that he wants SNF placement or LTAC for patient for the next 3 weeks until HD is determined if it needs to be chronic. CSW spoke with dialysis who reported that outpatient facilities will not accept patient's until it is determined if patient will be a chronic HD patient. Per CM, LTAC cannot accept patient. SNF will be unable to accept patient without outpatient dialysis. MD was made aware of barriers. CSW will continue to follow.  [place], [place] [phone]

## 2012-06-03 NOTE — Progress Notes (Signed)
Clinical Social Work  CSW received consult from MD for [facility] referral. CSW made CM aware of referral.   Unk Lightning, LCSW [phone]

## 2012-06-03 NOTE — Progress Notes (Signed)
[facility]                                                                                Patient Demographics  Brittney [name], is a 78 y.o. female  UJW:[id number]  FAO:[id number]  DOB - 1931/10/20  Admit date - 05/21/2012  Admitting Physician Gery Pray, MD  Outpatient Primary MD for the patient is FANTA,TESFAYE, MD  LOS - 13   No chief complaint on file.       Assessment & Plan    Acute on chronic CKD stage III  -Appreciate nephrology input , a slight creatinine around 1.3. Could have some component of vancomycin toxicity, urine E. structures were negative. -Renal ultrasound (9/23) negative for hydronephrosis, consistent with medical renal disease  Dialysis started per renal on 06/02/2012 through femoral dialysis catheter, plan is for short course of dialysis for the next few weeks if her renal function does not recover she will be a comfort care. Have discussed her case with nephrology on 9/26 not clear for surgery yet.      Diabetic foot infection/abscess/osteomyelitis foot along with severe PAD  -Surgical intraoperative culture showed MRSA  -Plan 42 days of antibiotics unless patient has BKA (abx start September 14)  -MRI not possible due to the patient's pacemaker  -osteomyelitis due to her surgical findings on September 20,pus extended to metatarsals as noted during intra-op findings-->therefore, she clinically has osteomyelitis , discussed with Dr. Ninetta Lights infectious disease on 06/03/2012 patient okay for Zyvox.        Severe peripheral vascular disease  -Revascularization delayed per Dr. Darrick Penna, appreciate followup , she will be a high-risk candidate for the surgery.      Acute on chronic Diastolic CHF  -Appreciate cardiology assistance  -Continue strict I/Os  -positive fluid balance for the hospitalization  -Saline lock IV fluids  -Dialysis is to be started on 9-25 for excess fluid removal     Diabetes mellitus type 2  -CBGs  have been well controlled  -Monitor CBGs as patient has worsening renal failure may increase half-life of insulin  -Patient continues to have poor by mouth intake  -Hemoglobin A1c 7.6 on September 14    CBG (last 3)   Basename 06/02/12 2340 06/02/12 2022 06/02/12 1708  GLUCAP 197* 193* 167*      Hypothyroidism  Increased Synthroid to 88 mcg daily   Lab Results  Component Value Date   TSH 5.714* 05/27/2012      Atrial fib  -does not appear to be good warfarin candidate due to falls, poor insight, and he has paced rhythm on the admission EKG. -ASA after surgeries , goal rate controlled     Severe protein calorie malnutrition  -Pre-albumin 4.0  -beneprotein has been started  -Glucerna supplement has been started      Anemia of CKD  -Iron saturation 11%  -Iron dosing per nephrology       Code Status: Full for now, DO NOT RESUSCITATE if worsens per daughter, we will do comfort care if renal function does not recover in the next few weeks.  Family Communication: Discussed poor prognosis with her daughter and patient, daughter which is then non-heroic treatment, wishes that  if patient declines focus on comfort, no long-term life support  Disposition Plan: Be decided  Riley Lam remains poor discussed with daughter if she declines daughter wishes comfort care   Procedures femoral dialysis catheter placement on 06/02/2012 for starting of dialysis   Consults  [facility], renal   Time Spent in minutes   35   Antibiotics     Anti-infectives     Start     Dose/Rate Route Frequency Ordered Stop   06/03/12 1300   linezolid (ZYVOX) IVPB 600 mg        600 mg 300 mL/hr over 60 Minutes Intravenous Every 12 hours 06/03/12 1247     06/01/12 1800   DAPTOmycin (CUBICIN) 472 mg in sodium chloride 0.9 % IVPB  Status:  Discontinued     Comments: Indication--MRSA osteomyelitis      6 mg/kg  78.7 kg 218.9 mL/hr over 30 Minutes Intravenous Every 48 hours 06/01/12 1700  06/03/12 1247   05/29/12 1200   piperacillin-tazobactam (ZOSYN) IVPB 2.25 g  Status:  Discontinued        2.25 g 100 mL/hr over 30 Minutes Intravenous 4 times per day 05/29/12 0752 06/01/12 1707   05/27/12 1800   vancomycin (VANCOCIN) IVPB 1000 mg/200 mL premix  Status:  Discontinued        1,000 mg 200 mL/hr over 60 Minutes Intravenous Every 24 hours 05/27/12 0035 05/29/12 0742   05/23/12 2000   piperacillin-tazobactam (ZOSYN) IVPB 3.375 g  Status:  Discontinued        3.375 g 12.5 mL/hr over 240 Minutes Intravenous 3 times per day 05/23/12 1919 05/29/12 0752   05/23/12 0000   vancomycin (VANCOCIN) 750 mg in sodium chloride 0.9 % 150 mL IVPB        750 mg 150 mL/hr over 60 Minutes Intravenous Every 24 hours 05/22/12 1051 05/27/12 0218   05/22/12 0000   vancomycin (VANCOCIN) IVPB 1000 mg/200 mL premix  Status:  Discontinued        1,000 mg 200 mL/hr over 60 Minutes Intravenous Every 24 hours 05/21/12 2354 05/22/12 1051          Scheduled Meds:    . docusate sodium  100 mg Oral BID  . feeding supplement  237 mL Oral TID BM  . heparin  40 Units/kg Dialysis Once in dialysis  . heparin subcutaneous  5,000 Units Subcutaneous Q8H  . insulin aspart  0-5 Units Subcutaneous QHS  . insulin aspart  0-9 Units Subcutaneous TID WC  . insulin glargine  11 Units Subcutaneous QHS  . levothyroxine  88 mcg Oral QAC breakfast  . linezolid  600 mg Intravenous Q12H  . oxyCODONE  10 mg Oral Q12H  . protein supplement  1 scoop Oral TID WC  . senna  2 tablet Oral QHS  . sertraline  100 mg Oral Daily  . Travoprost (BAK Free)  1 drop Both Eyes QHS  . DISCONTD: DAPTOmycin (CUBICIN)  IV  6 mg/kg Intravenous Q48H  . DISCONTD: enoxaparin (LOVENOX) injection  30 mg Subcutaneous Q24H  . DISCONTD: vitamin C  1,000 mg Oral Daily   Continuous Infusions:  PRN Meds:.sodium chloride, acetaminophen, alteplase, alteplase, feeding supplement (NEPRO CARB STEADY), fentaNYL, heparin, heparin, heparin,  HYDROmorphone (DILAUDID) injection, lidocaine, lidocaine-prilocaine, ondansetron (ZOFRAN) IV, ondansetron (ZOFRAN) IV, ondansetron, oxyCODONE, pentafluoroprop-tetrafluoroeth, sodium chloride, sodium chloride, zolpidem, DISCONTD: sodium chloride, DISCONTD: sodium chloride, DISCONTD: sodium chloride DISCONTD: feeding supplement (NEPRO CARB STEADY), DISCONTD: lidocaine, DISCONTD: lidocaine-prilocaine, DISCONTD: pentafluoroprop-tetrafluoroeth   DVT Prophylaxis  Lovenox  Leroy Sea M.D on 06/03/2012 at 12:48 PM  Between 7am to 7pm - Pager - [phone]  After 7pm go to [email or web address] - password [email or web address]  And look for the night coverage person covering for me after hours  [facility] Office  [phone]    Subjective:   [place] today has, No headache, No chest pain, No abdominal pain - No Nausea, No new weakness tingling or numbness, does have some cough and mild shortness of breath.  Objective:   Filed Vitals:   06/03/12 0954 06/03/12 1158 06/03/12 1211 06/03/12 1214  BP: 113/45 121/58 130/57 127/61  Pulse: 70 70 70 70  Temp: 97.7 F (36.5 C) 97.1 F (36.2 C)    TempSrc: Oral Oral    Resp: 18 16 16 16   Height:      Weight:  80.7 kg (177 lb 14.6 oz)    SpO2: 97% 98%      Wt Readings from Last 3 Encounters:  06/03/12 80.7 kg (177 lb 14.6 oz)  06/03/12 80.7 kg (177 lb 14.6 oz)  06/03/12 80.7 kg (177 lb 14.6 oz)     Intake/Output Summary (Last 24 hours) at 06/03/12 1248 Last data filed at 06/03/12 0900  Gross per 24 hour  Intake    900 ml  Output   3200 ml  Net  -2300 ml    Exam Awake Alert, Oriented X 3, No new F.N deficits, Normal affect Comunas.AT,PERRAL Supple Neck,No JVD, No cervical lymphadenopathy appriciated.  Symmetrical Chest wall movement, Good air movement bilaterally, CTAB RRR,No Gallops,Rubs or new Murmurs, No Parasternal Heave +ve B.Sounds, Abd Soft, Non tender, No organomegaly appriciated, No rebound - guarding or rigidity. No  Cyanosis, Clubbing or edema, No new Rash or bruise, has wound VAC in her left foot   Data Review   Micro Results Recent Results (from the past 240 hour(s))  CULTURE, ROUTINE-ABSCESS     Status: Normal   Collection Time   05/28/12 10:58 AM      Component Value Range Status Comment   Specimen Description ABSCESS FOOT LEFT   Final    Special Requests NONE   Final    Gram Stain     Final    Value: NO WBC SEEN     NO SQUAMOUS EPITHELIAL CELLS SEEN     NO ORGANISMS SEEN   Culture     Final    Value: FEW METHICILLIN RESISTANT STAPHYLOCOCCUS AUREUS     Note: RIFAMPIN AND GENTAMICIN SHOULD NOT BE USED AS SINGLE DRUGS FOR TREATMENT OF STAPH INFECTIONS. CRITICAL RESULT CALLED TO, READ BACK BY AND VERIFIED WITH: PATTY MOSS 06/01/12 1010 BY SMITHERSJ   Report Status 06/01/2012 FINAL   Final    Organism ID, Bacteria METHICILLIN RESISTANT STAPHYLOCOCCUS AUREUS   Final     Radiology Reports Dg Chest 2 View  05/15/2012  *RADIOLOGY REPORT*  Clinical Data: Fever.  CHEST - 2 VIEW  Comparison: 01/24/2012.  Findings: The pacer wires are stable.  The cardiac silhouette, mediastinal and hilar contours are mildly prominent but unchanged. There is persistent central vascular congestion and possible mild interstitial edema.  No pleural effusion or focal infiltrate.  The bony thorax is intact.  IMPRESSION: Stable cardiac enlargement and central vascular congestion with possible mild interstitial edema.   Original Report Authenticated By: P. Loralie Champagne, M.D.    Ct Tibia Fibula Left W Contrast  05/27/2012  *RADIOLOGY REPORT*  Clinical Data:  Diabetic with foot pain, erythema and swelling. Possible abscess.  CT OF  THE LEFT LOWER LEG WITH CONTRAST  Technique:  Multidetector CT imaging of the left lower leg was performed according to the standard protocol following intravenous contrast administration. Multiplanar CT image reconstructions were also generated.  Contrast:  80 ml Omnipaque-300  Comparison:  Foot  radiographs 05/15/2012 and today.  Findings:  Imaging was performed from the proximal left lower leg through the left foot as specifically requested.  There is an ill-defined peripherally enhancing fluid collection within the fourth webspace of the foot.  This contains a small air bubble and measures up to 2.1 cm in greatest dimension. Fluid extends distally into the fourth toe.  There is some additional soft tissue emphysema tracking medially within the plantar forefoot within the plantar aspects of the second, third and fourth toes. No other measurable fluid collections are seen.  There is subcutaneous edema throughout the foot to extending into the distal lower leg.  Superficial varicosities are noted within the lower leg.  Bone windows demonstrate no cortical destruction or significant arthropathic changes.  IMPRESSION:  1.  There is an ill-defined fluid collection within the fourth webspace with distal extension into the fourth toe and along the plantar aspects of the second and third toes.  This collection demonstrates peripheral enhancement and air bubbles and is consistent with an abscess. 2.  No CT evidence of osteomyelitis. 3.  Nonspecific subcutaneous edema in the distal lower leg and plantar foot.   Original Report Authenticated By: [name], M.D.    US Renal  05/31/2012  *RADIOLOGY REPORT*  Clinical Data: Acute on chronic renal failure  RENAL/URINARY TRACT ULTRASOUND COMPLETE  Comparison:  Abdominal ultrasound dated 11/15/2009  Findings:  Right Kidney:  Measures 10.2 cm.  Echogenic renal parenchyma, likely reflecting medical renal disease.  No mass or hydronephrosis.  Left Kidney:  Measures 9.9 cm.  Echogenic renal parenchyma, likely reflecting medical renal disease.  Bladder:  Decompressed by indwelling Foley catheter.  IMPRESSION: Echogenic renal parenchyma, likely reflecting medical renal disease.  No hydronephrosis.  Bladder decompressed by indwelling Foley catheter.   Original Report  Authenticated By: Charline Bills, M.D.    Dg Chest Port 1 View  05/29/2012  *RADIOLOGY REPORT*  Clinical Data: Evaluate for congestive heart failure.  PORTABLE CHEST - 1 VIEW  Comparison: Chest x-ray 05/25/2012.  Findings: Lung volumes are low.  No consolidative airspace disease. Probable trace bilateral pleural effusions.  Pulmonary venous congestion without frank pulmonary edema.  Mild cardiomegaly is unchanged. The patient is rotated to the left on today's exam, resulting in distortion of the mediastinal contours and reduced diagnostic sensitivity and specificity for mediastinal pathology. Atherosclerosis of the thoracic aorta. There is a right upper extremity PICC with tip terminating in the distal superior vena cava. Left-sided pacemaker device in place with lead tips projecting over the expected location of the right atrium and right ventricular apex.  IMPRESSION: 1.  Mild cardiomegaly with pulmonary venous congestion, but no frank pulmonary edema. 2.  Trace bilateral pleural effusions. 3.  Atherosclerosis. 4.  Support apparatus, as above.   Original Report Authenticated By: Florencia Reasons, M.D.    Dg Chest Port 1 View  05/25/2012  *RADIOLOGY REPORT*  Clinical Data: PICC line placement.  PORTABLE CHEST - 1 VIEW  Comparison: 05/21/2012  Findings: Right-sided PICC line noted with tip projecting over the SVC.  No pneumothorax or complicating feature.  Dual lead pacer noted.  There is mild cardiomegaly along with stable interstitial accentuation.  Peripheral airspace opacity in the right mid lung appears  slightly less striking.  Atherosclerotic calcification in the aortic arch noted.  IMPRESSION:  1.  Right PICC line tip:  SVC.  No pneumothorax or complicating feature. 2.  Cardiomegaly with mild interstitial accentuation, suspicious for mild interstitial edema. 3.  Faint airspace disease peripherally in the right mid lung appears improved.   Original Report Authenticated By: [name], M.D.     Dg Foot 2 Views Left  05/27/2012  *RADIOLOGY REPORT*  Clinical Data: Diabetic foot infection  LEFT FOOT - 2 VIEW  Comparison: 05/15/2012  Findings: Scattered soft tissue swelling. Diffuse osseous demineralization. Foci of soft tissue gas identified between the bases of the fourth and fifth toes. Joint spaces preserved. Hallux valgus. No acute fracture, dislocation or bone destruction.  IMPRESSION: Soft tissue gas between the bases of the fourth and fifth toes consistent with soft tissue infection. No definite evidence of osteomyelitis. If further soft tissue assessment needed or persistent clinical concern for occult osteomyelitis remains, consider MR imaging of the left foot.   Original Report Authenticated By: [name], M.D.    Dg Foot Complete Left  05/15/2012  *RADIOLOGY REPORT*  Clinical Data: Diabetic foot ulcers.  LEFT FOOT - COMPLETE 3+ VIEW  Comparison: None  Findings: The joint spaces are maintained.  No acute bony findings or destructive bony changes.  IMPRESSION: No plain film findings for osteomyelitis.   Original Report Authenticated By: P. Loralie Champagne, M.D.     CBC  Lab 06/03/12 1045 06/02/12 0532 06/01/12 0532 05/31/12 0610 05/30/12 0545  WBC 17.3* 13.6* 11.8* 10.9* 11.4*  HGB 8.9* 9.0* 8.7* 8.8* 8.9*  HCT 28.0* 28.4* 28.3* 27.9* 28.2*  PLT 384 362 336 332 354  MCV 91.2 91.9 91.3 90.9 91.0  MCH 29.0 29.1 28.1 28.7 28.7  MCHC 31.8 31.7 30.7 31.5 31.6  RDW 15.1 15.0 15.0 14.8 14.9  LYMPHSABS -- -- -- -- --  MONOABS -- -- -- -- --  EOSABS -- -- -- -- --  BASOSABS -- -- -- -- --  BANDABS -- -- -- -- --    Chemistries   Lab 06/03/12 1045 06/02/12 0532 06/01/12 0532 05/31/12 0610 05/30/12 0545  NA 133* 134* 134* 133* 134*  K 4.4 4.9 4.6 4.3 4.5  CL 95* 94* 95* 93* 96  CO2 27 29 31 31 30   GLUCOSE 246* 219* 128* 157* 186*  BUN 34* 49* 41* 34* 29*  CREATININE 3.23* 4.72* 4.18* 3.43* 2.88*  CALCIUM 9.1 9.2 9.0 9.0 9.0  MG -- -- -- -- --  AST -- -- -- -- --  ALT  -- -- -- -- --  ALKPHOS -- -- -- -- --  BILITOT -- -- -- -- --   ------------------------------------------------------------------------------------------------------------------ estimated creatinine clearance is 15.7 ml/min (by C-G formula based on Cr of 3.23). ------------------------------------------------------------------------------------------------------------------ No results found for this basename: HGBA1C:2 in the last 72 hours ------------------------------------------------------------------------------------------------------------------ No results found for this basename: CHOL:2,HDL:2,LDLCALC:2,TRIG:2,CHOLHDL:2,LDLDIRECT:2 in the last 72 hours ------------------------------------------------------------------------------------------------------------------ No results found for this basename: TSH,T4TOTAL,FREET3,T3FREE,THYROIDAB in the last 72 hours ------------------------------------------------------------------------------------------------------------------  [facility] 05/31/12 1437  VITAMINB12 1384*  FOLATE 12.3  FERRITIN 246  TIBC 214*  IRON 23*  RETICCTPCT 2.0    Coagulation profile  Lab 06/01/12 0532  INR 1.22  PROTIME --    No results found for this basename: DDIMER:2 in the last 72 hours  Cardiac Enzymes No results found for this basename: CK:3,CKMB:3,TROPONINI:3,MYOGLOBIN:3 in the last 168 hours ------------------------------------------------------------------------------------------------------------------ No components found with this basename: POCBNP:3

## 2012-06-04 LAB — BASIC METABOLIC PANEL
BUN: 25 mg/dL — ABNORMAL HIGH (ref 6–23)
CO2: 31 mEq/L (ref 19–32)
Calcium: 8.8 mg/dL (ref 8.4–10.5)
Creatinine, Ser: 2.05 mg/dL — ABNORMAL HIGH (ref 0.50–1.10)
GFR calc non Af Amer: 22 mL/min — ABNORMAL LOW (ref >90)
Glucose, Bld: 258 mg/dL — ABNORMAL HIGH (ref 70–99)

## 2012-06-04 LAB — GLUCOSE, CAPILLARY: Glucose-Capillary: 208 mg/dL — ABNORMAL HIGH (ref 70–99)

## 2012-06-04 LAB — CBC
HCT: 27.3 % — ABNORMAL LOW (ref 36.0–46.0)
Hemoglobin: 8.6 g/dL — ABNORMAL LOW (ref 12.0–15.0)
MCH: 29 pg (ref 26.0–34.0)
MCHC: 31.5 g/dL (ref 30.0–36.0)
MCV: 91.9 fL (ref 78.0–100.0)
RBC: 2.97 MIL/uL — ABNORMAL LOW (ref 3.87–5.11)

## 2012-06-04 MED ORDER — SODIUM CHLORIDE 0.9 % IV SOLN: 100.0000 mL | INTRAVENOUS | Status: DC | PRN

## 2012-06-04 MED ORDER — SODIUM CHLORIDE 0.9 % IV SOLN
100.0000 mL | INTRAVENOUS | Status: DC | PRN
  Administered 2012-06-04: 100 mL via INTRAVENOUS

## 2012-06-04 MED ORDER — LIDOCAINE HCL (PF) 1 % IJ SOLN: 5.0000 mL | INTRAMUSCULAR | Status: DC | PRN

## 2012-06-04 MED ORDER — HEPARIN SODIUM (PORCINE) 1000 UNIT/ML DIALYSIS
100.0000 [IU]/kg | INTRAMUSCULAR | Status: DC | PRN
  Filled 2012-06-04: qty 9

## 2012-06-04 MED ORDER — ALTEPLASE 2 MG IJ SOLR: 2.0000 mg | Freq: Once | INTRAMUSCULAR | Status: AC | PRN

## 2012-06-04 MED ORDER — LIDOCAINE-PRILOCAINE 2.5-2.5 % EX CREA: 1.0000 "application " | TOPICAL_CREAM | CUTANEOUS | Status: DC | PRN

## 2012-06-04 MED ORDER — NEPRO/CARBSTEADY PO LIQD: 237.0000 mL | ORAL | Status: DC | PRN

## 2012-06-04 MED ORDER — FUROSEMIDE 80 MG PO TABS
160.0000 mg | ORAL_TABLET | Freq: Three times a day (TID) | ORAL | Status: DC
  Administered 2012-06-04 – 2012-06-06 (×6): 160 mg via ORAL
  Filled 2012-06-04 (×10): qty 2

## 2012-06-04 MED ORDER — HEPARIN SODIUM (PORCINE) 1000 UNIT/ML DIALYSIS: 1000.0000 [IU] | INTRAMUSCULAR | Status: DC | PRN

## 2012-06-04 MED ORDER — PENTAFLUOROPROP-TETRAFLUOROETH EX AERO: 1.0000 "application " | INHALATION_SPRAY | CUTANEOUS | Status: DC | PRN

## 2012-06-04 NOTE — Progress Notes (Signed)
Inpatient Diabetes Program Recommendations  AACE/ADA: New Consensus Statement on Inpatient Glycemic Control  Target Ranges:  Prepandial:   less than 140 mg/dL      Peak postprandial:   less than 180 mg/dL (1-2 hours)      Critically ill patients:  140 - 180 mg/dL  Pager:  [phone] Hours:  8 am-10pm   Reason for Visit: Elevated glucose:  Results for ERICK, OXENDINE (MRN [id number]) as of 06/04/2012 09:49  Ref. Range 06/03/2012 07:28 06/03/2012 11:45 06/03/2012 17:08 06/03/2012 21:53 06/04/2012 08:06  Glucose-Capillary Latest Range: 70-99 mg/dL 469 (H) 629 (H) 528 (H) 311 (H) 224 (H)    Inpatient Diabetes Program Recommendations Insulin - Basal: Please increase Lantus to 15 units qhs. Insulin - Meal Coverage: Please consider adding meal coverage- Novolog 4 units tid with meals.  Alfredia Client PhD, RN, BC-ADM Diabetes Coordinator  Office:  [phone] Team Pager:  [phone]

## 2012-06-04 NOTE — Progress Notes (Signed)
[facility]                                                                                Patient Demographics  Brittney [name], is a 78 y.o. female  ZOX:[id number]  [id number]:[id number]  DOB - 11-18-1931  Admit date - 05/21/2012  Admitting Physician Gery Pray, MD  Outpatient Primary MD for the patient is FANTA,TESFAYE, MD  LOS - 14   No chief complaint on file.       Assessment & Plan    Brittney [name] is a 78 y.o. female who was transferred to [facility] from the [facility] on 05-21-12 due to a worsening diabetic foot wound of the left foot. She was hospitalized the previous week at [facility] She has a history of PAD, and had a previous right foot trans metatarsal amputation. After arrival here patient had previous vascular studies including CT angiogram, she also had extensive debridement along with removal of infected bone and tissue done by vascular surgery last week this admission, patient developed acute renal insufficiency due to combination of IV dye injury and antibiotics possibly vancomycin, she also developed acute on chronic diastolic CHF, she was seen by cardiology, nephrology, femoral dialysis catheter was placed and she was started on dialysis on 06/02/2012, plan is long-term to do a femoropopliteal bypass on the left leg once renal function stabilizes.   Patient has poor baseline function currently she is undergoing two-week trial of dialysis to see if renal function stabilizes. I have discussed her case with nephrology who do not want any surgeries to be scheduled for the next 2 weeks. If patient declines per family we will focus on comfort.   Placement was requested through social work and case management on 06/03/2012 but patient is hard to place due to being on acute dialysis.   I have discussed her case with infectious disease physician Dr. Ninetta Lights multiple times plan is to give her a Zyvox through IV based on her intraoperative cultures  growing MRSA for a total of 6 weeks with start date being September 14 (was covered on Vanco and Cubicin prior to this date)     Acute on chronic CKD stage III   -Appreciate nephrology input , a slight creatinine around 1.3. Could have some component of vancomycin toxicity, urine E. structures were negative. -Renal ultrasound (9/23) negative for hydronephrosis, consistent with medical renal disease  Dialysis started per renal on 06/02/2012 through femoral dialysis catheter, plan is for short course of dialysis for the next few weeks if her renal function does not recover she will be a comfort care. Have discussed her case with nephrology on 9/26 not clear for surgery yet.      Diabetic foot infection/abscess/osteomyelitis foot along with severe PAD  -Surgical intraoperative culture showed MRSA  -Plan 42 days of antibiotics unless patient has BKA (abx start September 14)  -MRI not possible due to the patient's pacemaker  -osteomyelitis due to her surgical findings on September 20,pus extended to metatarsals as noted during intra-op findings-->therefore, she clinically has osteomyelitis , discussed with Dr. Ninetta Lights infectious disease on 06/03/2012 patient okay for Zyvox.   I have discussed her case with  infectious disease physician Dr. Ninetta Lights multiple times plan is to give her a Zyvox through IV based on her intraoperative cultures growing MRSA for a total of 6 weeks with start date being September 14 (was covered on Vanco and Cubicin prior to this date)      Severe peripheral vascular disease  -Revascularization delayed per Dr. Darrick Penna, appreciate followup , she will be a high-risk candidate for the surgery.      Acute on chronic Diastolic CHF  -Appreciate cardiology assistance  -Continue strict I/Os  -positive fluid balance for the hospitalization  -Saline lock IV fluids  -Dialysis is to be started on 9-25 for excess fluid removal     Diabetes mellitus type 2  -CBGs have  been well controlled  -Monitor CBGs as patient has worsening renal failure may increase half-life of insulin  -Patient continues to have poor by mouth intake  -Hemoglobin A1c 7.6 on September 14    CBG (last 3)   Basename 06/04/12 0806 06/03/12 2153 06/03/12 1708  GLUCAP 224* 311* 188*      Hypothyroidism  Increased Synthroid to 88 mcg daily   Lab Results  Component Value Date   TSH 5.714* 05/27/2012      Atrial fib  -does not appear to be good warfarin candidate due to falls, poor insight, and he has paced rhythm on the admission EKG. -ASA after surgeries , goal rate controlled     Severe protein calorie malnutrition  -Pre-albumin 4.0  -beneprotein has been started  -Glucerna supplement has been started      Anemia of CKD  -Iron saturation 11%  -Iron dosing per nephrology     On-and-off mild delirium with underlying early dementia  Patient and family educated, supportive care.     Code Status: Full for now, DO NOT RESUSCITATE if worsens per daughter, we will do comfort care if renal function does not recover in the next few weeks.  Family Communication: Discussed poor prognosis with her daughter and patient, daughter which is then non-heroic treatment, wishes that if patient declines focus on comfort, no long-term life support  Disposition Plan: Be decided  Brittney [name] remains poor discussed with daughter if she declines daughter wishes comfort care   Procedures femoral dialysis catheter placement on 06/02/2012 for starting of dialysis   Consults  Vas Surgery, renal   Time Spent in minutes   35   Antibiotics     Anti-infectives     Start     Dose/Rate Route Frequency Ordered Stop   06/03/12 1300   linezolid (ZYVOX) IVPB 600 mg        600 mg 300 mL/hr over 60 Minutes Intravenous Every 12 hours 06/03/12 1247     06/01/12 1800   DAPTOmycin (CUBICIN) 472 mg in sodium chloride 0.9 % IVPB  Status:  Discontinued     Comments: Indication--MRSA  osteomyelitis      6 mg/kg  78.7 kg 218.9 mL/hr over 30 Minutes Intravenous Every 48 hours 06/01/12 1700 06/03/12 1247   05/29/12 1200   piperacillin-tazobactam (ZOSYN) IVPB 2.25 g  Status:  Discontinued        2.25 g 100 mL/hr over 30 Minutes Intravenous 4 times per day 05/29/12 0752 06/01/12 1707   05/27/12 1800   vancomycin (VANCOCIN) IVPB 1000 mg/200 mL premix  Status:  Discontinued        1,000 mg 200 mL/hr over 60 Minutes Intravenous Every 24 hours 05/27/12 0035 05/29/12 0742   05/23/12 2000   piperacillin-tazobactam (ZOSYN) IVPB  3.375 g  Status:  Discontinued        3.375 g 12.5 mL/hr over 240 Minutes Intravenous 3 times per day 05/23/12 1919 05/29/12 0752   05/23/12 0000   vancomycin (VANCOCIN) 750 mg in sodium chloride 0.9 % 150 mL IVPB        750 mg 150 mL/hr over 60 Minutes Intravenous Every 24 hours 05/22/12 1051 05/27/12 0218   05/22/12 0000   vancomycin (VANCOCIN) IVPB 1000 mg/200 mL premix  Status:  Discontinued        1,000 mg 200 mL/hr over 60 Minutes Intravenous Every 24 hours 05/21/12 2354 05/22/12 1051          Scheduled Meds:    . alteplase  2 mg Intracatheter Once  . docusate sodium  100 mg Oral BID  . feeding supplement  237 mL Oral TID BM  . furosemide  160 mg Oral TID  . heparin  40 Units/kg Dialysis Once in dialysis  . heparin subcutaneous  5,000 Units Subcutaneous Q8H  . insulin aspart  0-5 Units Subcutaneous QHS  . insulin aspart  0-9 Units Subcutaneous TID WC  . insulin glargine  11 Units Subcutaneous QHS  . levothyroxine  88 mcg Oral QAC breakfast  . linezolid  600 mg Intravenous Q12H  . oxyCODONE  10 mg Oral Q12H  . protein supplement  1 scoop Oral TID WC  . senna  2 tablet Oral QHS  . Travoprost (BAK Free)  1 drop Both Eyes QHS  . DISCONTD: DAPTOmycin (CUBICIN)  IV  6 mg/kg Intravenous Q48H  . DISCONTD: sertraline  100 mg Oral Daily   Continuous Infusions:  PRN Meds:.sodium chloride, sodium chloride, acetaminophen, alteplase,  alteplase, feeding supplement (NEPRO CARB STEADY), fentaNYL, heparin, heparin, HYDROmorphone (DILAUDID) injection, lidocaine, lidocaine-prilocaine, ondansetron (ZOFRAN) IV, oxyCODONE, pentafluoroprop-tetrafluoroeth, sodium chloride, sodium chloride, zolpidem, DISCONTD: sodium chloride, DISCONTD: feeding supplement (NEPRO CARB STEADY), DISCONTD: heparin, DISCONTD: heparin, DISCONTD: heparin DISCONTD: lidocaine, DISCONTD: lidocaine-prilocaine, DISCONTD: ondansetron (ZOFRAN) IV, DISCONTD: ondansetron, DISCONTD: pentafluoroprop-tetrafluoroeth   DVT Prophylaxis  Heparin    Susa Raring K M.D on 06/04/2012 at 12:24 PM  Between 7am to 7pm - Pager - [phone]  After 7pm go to [email or web address] - password [email or web address]  And look for the night coverage person covering for me after hours  [facility] Office  [phone]    Subjective:   Brittney [name] today has, No headache, No chest pain, No abdominal pain - No Nausea, No new weakness tingling or numbness, does have some cough and mild shortness of breath.  Objective:   Filed Vitals:   06/03/12 1517 06/03/12 1726 06/03/12 2113 06/04/12 0545  BP: 118/50 116/62 124/44 118/50  Pulse: 70 70 70 69  Temp: 97.3 F (36.3 C) 98.2 F (36.8 C) 98.8 F (37.1 C) 98.2 F (36.8 C)  TempSrc: Oral  Oral Oral  Resp: 18 20 18 18   Height:      Weight: 77.3 kg (170 lb 6.7 oz)   83.5 kg (184 lb 1.4 oz)  SpO2: 96% 97% 97% 98%    Wt Readings from Last 3 Encounters:  06/04/12 83.5 kg (184 lb 1.4 oz)  06/04/12 83.5 kg (184 lb 1.4 oz)  06/04/12 83.5 kg (184 lb 1.4 oz)     Intake/Output Summary (Last 24 hours) at 06/04/12 1224 Last data filed at 06/04/12 0600  Gross per 24 hour  Intake   1400 ml  Output   3422 ml  Net  -2022 ml    Exam Awake Alert, Oriented X  2, No new F.N deficits, Normal affect [place].AT,PERRAL Supple Neck,No JVD, No cervical lymphadenopathy appriciated.  Symmetrical Chest wall movement, Good air movement bilaterally,  CTAB RRR,No Gallops,Rubs or new Murmurs, No Parasternal Heave +ve B.Sounds, Abd Soft, Non tender, No organomegaly appriciated, No rebound - guarding or rigidity. No Cyanosis, Clubbing or edema, No new Rash or bruise, has wound VAC in her left foot, right foot old transmetatarsal amputation.   Data Review   Micro Results Recent Results (from the past 240 hour(s))  CULTURE, ROUTINE-ABSCESS     Status: Normal   Collection Time   05/28/12 10:58 AM      Component Value Range Status Comment   Specimen Description ABSCESS FOOT LEFT   Final    Special Requests NONE   Final    Gram Stain     Final    Value: NO WBC SEEN     NO SQUAMOUS EPITHELIAL CELLS SEEN     NO ORGANISMS SEEN   Culture     Final    Value: FEW METHICILLIN RESISTANT STAPHYLOCOCCUS AUREUS     Note: RIFAMPIN AND GENTAMICIN SHOULD NOT BE USED AS SINGLE DRUGS FOR TREATMENT OF STAPH INFECTIONS. CRITICAL RESULT CALLED TO, READ BACK BY AND VERIFIED WITH: PATTY MOSS 06/01/12 1010 BY SMITHERSJ   Report Status 06/01/2012 FINAL   Final    Organism ID, Bacteria METHICILLIN RESISTANT STAPHYLOCOCCUS AUREUS   Final     Radiology Reports Dg Chest 2 View  05/15/2012  *RADIOLOGY REPORT*  Clinical Data: Fever.  CHEST - 2 VIEW  Comparison: 01/24/2012.  Findings: The pacer wires are stable.  The cardiac silhouette, mediastinal and hilar contours are mildly prominent but unchanged. There is persistent central vascular congestion and possible mild interstitial edema.  No pleural effusion or focal infiltrate.  The bony thorax is intact.  IMPRESSION: Stable cardiac enlargement and central vascular congestion with possible mild interstitial edema.   Original Report Authenticated By: P. Loralie Champagne, M.D.    Ct Tibia Fibula Left W Contrast  05/27/2012  *RADIOLOGY REPORT*  Clinical Data:  Diabetic with foot pain, erythema and swelling. Possible abscess.  CT OF THE LEFT LOWER LEG WITH CONTRAST  Technique:  Multidetector CT imaging of the left lower leg was  performed according to the standard protocol following intravenous contrast administration. Multiplanar CT image reconstructions were also generated.  Contrast:  80 ml Omnipaque-300  Comparison:  Foot radiographs 05/15/2012 and today.  Findings:  Imaging was performed from the proximal left lower leg through the left foot as specifically requested.  There is an ill-defined peripherally enhancing fluid collection within the fourth webspace of the foot.  This contains a small air bubble and measures up to 2.1 cm in greatest dimension. Fluid extends distally into the fourth toe.  There is some additional soft tissue emphysema tracking medially within the plantar forefoot within the plantar aspects of the second, third and fourth toes. No other measurable fluid collections are seen.  There is subcutaneous edema throughout the foot to extending into the distal lower leg.  Superficial varicosities are noted within the lower leg.  Bone windows demonstrate no cortical destruction or significant arthropathic changes.  IMPRESSION:  1.  There is an ill-defined fluid collection within the fourth webspace with distal extension into the fourth toe and along the plantar aspects of the second and third toes.  This collection demonstrates peripheral enhancement and air bubbles and is consistent with an abscess. 2.  No CT evidence of osteomyelitis. 3.  Nonspecific subcutaneous  edema in the distal lower leg and plantar foot.   Original Report Authenticated By: [name], M.D.    US Renal  05/31/2012  *RADIOLOGY REPORT*  Clinical Data: Acute on chronic renal failure  RENAL/URINARY TRACT ULTRASOUND COMPLETE  Comparison:  Abdominal ultrasound dated 11/15/2009  Findings:  Right Kidney:  Measures 10.2 cm.  Echogenic renal parenchyma, likely reflecting medical renal disease.  No mass or hydronephrosis.  Left Kidney:  Measures 9.9 cm.  Echogenic renal parenchyma, likely reflecting medical renal disease.  Bladder:  Decompressed by  indwelling Foley catheter.  IMPRESSION: Echogenic renal parenchyma, likely reflecting medical renal disease.  No hydronephrosis.  Bladder decompressed by indwelling Foley catheter.   Original Report Authenticated By: Charline Bills, M.D.    Dg Chest Port 1 View  05/29/2012  *RADIOLOGY REPORT*  Clinical Data: Evaluate for congestive heart failure.  PORTABLE CHEST - 1 VIEW  Comparison: Chest x-ray 05/25/2012.  Findings: Lung volumes are low.  No consolidative airspace disease. Probable trace bilateral pleural effusions.  Pulmonary venous congestion without frank pulmonary edema.  Mild cardiomegaly is unchanged. The patient is rotated to the left on today's exam, resulting in distortion of the mediastinal contours and reduced diagnostic sensitivity and specificity for mediastinal pathology. Atherosclerosis of the thoracic aorta. There is a right upper extremity PICC with tip terminating in the distal superior vena cava. Left-sided pacemaker device in place with lead tips projecting over the expected location of the right atrium and right ventricular apex.  IMPRESSION: 1.  Mild cardiomegaly with pulmonary venous congestion, but no frank pulmonary edema. 2.  Trace bilateral pleural effusions. 3.  Atherosclerosis. 4.  Support apparatus, as above.   Original Report Authenticated By: Florencia Reasons, M.D.    Dg Chest Port 1 View  05/25/2012  *RADIOLOGY REPORT*  Clinical Data: PICC line placement.  PORTABLE CHEST - 1 VIEW  Comparison: 05/21/2012  Findings: Right-sided PICC line noted with tip projecting over the SVC.  No pneumothorax or complicating feature.  Dual lead pacer noted.  There is mild cardiomegaly along with stable interstitial accentuation.  Peripheral airspace opacity in the right mid lung appears slightly less striking.  Atherosclerotic calcification in the aortic arch noted.  IMPRESSION:  1.  Right PICC line tip:  SVC.  No pneumothorax or complicating feature. 2.  Cardiomegaly with mild  interstitial accentuation, suspicious for mild interstitial edema. 3.  Faint airspace disease peripherally in the right mid lung appears improved.   Original Report Authenticated By: [name], M.D.    Dg Foot 2 Views Left  05/27/2012  *RADIOLOGY REPORT*  Clinical Data: Diabetic foot infection  LEFT FOOT - 2 VIEW  Comparison: 05/15/2012  Findings: Scattered soft tissue swelling. Diffuse osseous demineralization. Foci of soft tissue gas identified between the bases of the fourth and fifth toes. Joint spaces preserved. Hallux valgus. No acute fracture, dislocation or bone destruction.  IMPRESSION: Soft tissue gas between the bases of the fourth and fifth toes consistent with soft tissue infection. No definite evidence of osteomyelitis. If further soft tissue assessment needed or persistent clinical concern for occult osteomyelitis remains, consider MR imaging of the left foot.   Original Report Authenticated By: [name], M.D.    Dg Foot Complete Left  05/15/2012  *RADIOLOGY REPORT*  Clinical Data: Diabetic foot ulcers.  LEFT FOOT - COMPLETE 3+ VIEW  Comparison: None  Findings: The joint spaces are maintained.  No acute bony findings or destructive bony changes.  IMPRESSION: No plain film findings for  osteomyelitis.   Original Report Authenticated By: P. Loralie Champagne, M.D.     CBC  Lab 06/04/12 0629 06/03/12 1045 06/02/12 0532 06/01/12 0532 05/31/12 0610  WBC 14.1* 17.3* 13.6* 11.8* 10.9*  HGB 8.6* 8.9* 9.0* 8.7* 8.8*  HCT 27.3* 28.0* 28.4* 28.3* 27.9*  PLT 369 384 362 336 332  MCV 91.9 91.2 91.9 91.3 90.9  MCH 29.0 29.0 29.1 28.1 28.7  MCHC 31.5 31.8 31.7 30.7 31.5  RDW 15.1 15.1 15.0 15.0 14.8  LYMPHSABS -- -- -- -- --  MONOABS -- -- -- -- --  EOSABS -- -- -- -- --  BASOSABS -- -- -- -- --  BANDABS -- -- -- -- --    Chemistries   Lab 06/04/12 0629 06/03/12 1045 06/02/12 0532 06/01/12 0532 05/31/12 0610  NA 132* 133* 134* 134* 133*  K 3.7 4.4 4.9 4.6 4.3  CL 94* 95*  94* 95* 93*  CO2 31 27 29 31 31   GLUCOSE 258* 246* 219* 128* 157*  BUN 25* 34* 49* 41* 34*  CREATININE 2.05* 3.23* 4.72* 4.18* 3.43*  CALCIUM 8.8 9.1 9.2 9.0 9.0  MG -- -- -- -- --  AST -- -- -- -- --  ALT -- -- -- -- --  ALKPHOS -- -- -- -- --  BILITOT -- -- -- -- --   ------------------------------------------------------------------------------------------------------------------ estimated creatinine clearance is 25.1 ml/min (by C-G formula based on Cr of 2.05). ------------------------------------------------------------------------------------------------------------------ No results found for this basename: HGBA1C:2 in the last 72 hours ------------------------------------------------------------------------------------------------------------------ No results found for this basename: CHOL:2,HDL:2,LDLCALC:2,TRIG:2,CHOLHDL:2,LDLDIRECT:2 in the last 72 hours ------------------------------------------------------------------------------------------------------------------ No results found for this basename: TSH,T4TOTAL,FREET3,T3FREE,THYROIDAB in the last 72 hours ------------------------------------------------------------------------------------------------------------------ No results found for this basename: VITAMINB12:2,FOLATE:2,FERRITIN:2,TIBC:2,IRON:2,RETICCTPCT:2 in the last 72 hours  Coagulation profile  Lab 06/01/12 0532  INR 1.22  PROTIME --    No results found for this basename: DDIMER:2 in the last 72 hours  Cardiac Enzymes No results found for this basename: CK:3,CKMB:3,TROPONINI:3,MYOGLOBIN:3 in the last 168 hours ------------------------------------------------------------------------------------------------------------------ No components found with this basename: POCBNP:3

## 2012-06-04 NOTE — Progress Notes (Signed)
Occupational Therapy Note  OT order received and appreciated. Per PT note, pt is on bedrest and not to be up with current femoral dialysis catheter (see 9/27 PT note).  Will sign off at this time. Please re-order when pt appropriate to participate in OT. Thanks!  06/04/2012 Cipriano Mile OTR/L Pager [phone] Office [phone]

## 2012-06-04 NOTE — Progress Notes (Signed)
Pt sleepy  Left foot wound clean but no real granulation skin edge bleeds easily  At this point foot is still salvageable Hopefully bypass when renal issues stable Will recheck wound early next week and follow up on renal function Could potentially do bypass next Wednesday if foot does not deteriorate and renal function stable If foot starts to backslide will need BKA  Fabienne Bruns, MD [facility] Office: [phone] Pager: [phone]

## 2012-06-04 NOTE — Progress Notes (Signed)
Physical Therapy Discharge Patient Details Name: Brittney [name] MRN: [id number] DOB: 1932-01-27 Today's Date: 06/04/2012 Time:  -     Patient discharged from PT services secondary to spoke with Dr. Darrick Penna and pt may not be up with current femoral dialysis catheter. Notified him that pt has an up with assistance order and he stated he would address a bedrest order. Please re-order PT when able to participate with activity.   Discharge plan discussed with patient and/or caregiver: Patient/Caregiver agrees with plan  GP     Lova Urbieta 06/04/2012, 11:23 AM Pager [phone]

## 2012-06-04 NOTE — Consult Note (Signed)
WOC follow up Pts VAC dressing removed for assessment per vascular surgery.   Replaced VAC dressing 1pc black granufoam cut to fit into wound, drape applied, seal obtained at .  Pt tolerated well.  WOC will follow along with you for [facility] dressing assistance as needed. Aslee Such [place], [place] [phone]

## 2012-06-04 NOTE — Progress Notes (Signed)
Daily Renal Progress Note  Subjective:   Patient had no acute events overnight. Mental status closer to baseline   Objective:   BP 118/50  Pulse 69  Temp 98.2 F (36.8 C) (Oral)  Resp 18  Ht 5\' 7"  (1.702 m)  Wt 184 lb 1.4 oz (83.5 kg)  BMI 28.83 kg/m2  SpO2 98%  Intake/Output Summary (Last 24 hours) at 06/04/12 0917 Last data filed at 06/04/12 0600  Gross per 24 hour  Intake   1400 ml  Output   3422 ml  Net  -2022 ml   Weight change: -7 lb 4.4 oz (-3.3 kg)  Physical Exam: Gen: Sleeping in bed, comfortable.  CVS: RRR, no murmur Resp: CTAB. Unlabored breathing ZOX:WRUE, nontender. Increased dependent edema of flanks GU: Right femoral line with dressing C/D/I. PICC RUE Ext: Diffusely edematous. Wound vac left foot. Right forefoot surgically absent  Imaging: No results found. Labs: BMET  Lab 06/04/12 0629 06/03/12 1045 06/02/12 0532 06/01/12 0532 05/31/12 1437 05/31/12 0610 05/30/12 0545 05/29/12 0540  NA 132* 133* 134* 134* -- 133* 134* 132*  K 3.7 4.4 4.9 4.6 -- 4.3 4.5 4.5  CL 94* 95* 94* 95* -- 93* 96 95*  CO2 31 27 29 31  -- 31 30 31   GLUCOSE 258* 246* 219* 128* -- 157* 186* 206*  BUN 25* 34* 49* 41* -- 34* 29* 23  CREATININE 2.05* 3.23* 4.72* 4.18* -- 3.43* 2.88* 2.10*  ALB -- -- -- -- -- -- -- --  CALCIUM 8.8 9.1 9.2 9.0 -- 9.0 9.0 8.8  PHOS -- -- 6.3* 5.2* 4.7* -- -- --   CBC  Lab 06/04/12 0629 06/03/12 1045 06/02/12 0532 06/01/12 0532  WBC 14.1* 17.3* 13.6* 11.8*  NEUTROABS -- -- -- --  HGB 8.6* 8.9* 9.0* 8.7*  HCT 27.3* 28.0* 28.4* 28.3*  MCV 91.9 91.2 91.9 91.3  PLT 369 384 362 336    Medications:       . alteplase  2 mg Intracatheter Once  . docusate sodium  100 mg Oral BID  . feeding supplement  237 mL Oral TID BM  . heparin  40 Units/kg Dialysis Once in dialysis  . heparin subcutaneous  5,000 Units Subcutaneous Q8H  . insulin aspart  0-5 Units Subcutaneous QHS  . insulin aspart  0-9 Units Subcutaneous TID WC  . insulin glargine  11  Units Subcutaneous QHS  . levothyroxine  88 mcg Oral QAC breakfast  . linezolid  600 mg Intravenous Q12H  . oxyCODONE  10 mg Oral Q12H  . protein supplement  1 scoop Oral TID WC  . senna  2 tablet Oral QHS  . Travoprost (BAK Free)  1 drop Both Eyes QHS  . DISCONTD: DAPTOmycin (CUBICIN)  IV  6 mg/kg Intravenous Q48H  . DISCONTD: sertraline  100 mg Oral Daily  . DISCONTD: vitamin C  1,000 mg Oral Daily    Assessment/ Plan:   Pt is a 78 y.o. yo female with a PMHX of HTN, DM, CAD, A.fib, PVD, CKD with baseline Cr 1.0-1.3 who was transferred to [facility] on 05/21/2012 from [facility] for evaluation of worsening diabetic foot wound, now s/p I&D of abscess and amputation of digits 3,4,5.   ARF- Patient with CKD, now with elevated creatinine. Differential includes volume overload, drug toxicity with supertherapeutic vanc level, but most likely due to contrasted study in setting of CKD and DM. Her urine output is decreasing; 425cc recorded yesterday. Her electrolytes are stable. She is not acidotic at this  time. Patient developed mental status changes yesterday, and was started on HD on 05/23/12 x1 treatment. - UA unremarkable. FENa 1.13   - Strict I/O's  - Avoid further contrast and nephrotoxic drugs including NSAIDs. Would also recommend postponing large surgical procedures for now as well - Renal panel in the AM  - Hep lock IV to decrease any extra fluids - Will hold off on doing another hemodialysis treatment today since patient's creat is improving.  Anemia- Appears she does not have chronic anemia, but her HgB has been trending down post-operatively. Iron is low at 23 with sat of 11 - s/p feraheme - Monitor HgB daily   Foot infection- Possible osteomyelitis based on clinical findings. VSS following, and patient to OR 9/20 for debridement as well as amputation of digits.  - Continue Dapto, per primary team  - Consider rechecking Vanc trough in a few days to confirm clearance   CHF/A.fib-  Followed by Cardiology.  - Rate is controlled at this time with pacemaker   PAD- VSS planning for bypass, which has now been postponed.   DM- Per primary team. A1c 7.6. CBG controlled with SSI at this time   Nutrition- Albumin low at 1.9, and has been low for a while  - Continue nutrition supplementation  - Consider nutrition consult   Dispo- Will start Lasix 160mg  PO TID now and monitor UOP. If her urine output improves and patient remains close to baseline, she may not need additional HD. Keep femoral line in for now.  DVT PPX - Lovenox, per primary team  [name], MD 06/04/2012, 9:17 AM I have seen and examined this patient and agree with the plan of care  Seen and eval.  Cr improving but vol xs. See if responds to furosemide. .  Denae Zulueta L 06/04/2012, 10:57 AM

## 2012-06-05 LAB — CBC
Hemoglobin: 9 g/dL — ABNORMAL LOW (ref 12.0–15.0)
MCH: 29.1 pg (ref 26.0–34.0)
MCHC: 32.4 g/dL (ref 30.0–36.0)
MCV: 90 fL (ref 78.0–100.0)
RBC: 3.09 MIL/uL — ABNORMAL LOW (ref 3.87–5.11)

## 2012-06-05 LAB — GLUCOSE, CAPILLARY
Glucose-Capillary: 288 mg/dL — ABNORMAL HIGH (ref 70–99)
Glucose-Capillary: 223 mg/dL — ABNORMAL HIGH (ref 70–99)

## 2012-06-05 LAB — BASIC METABOLIC PANEL
CO2: 30 mEq/L (ref 19–32)
Calcium: 9.2 mg/dL (ref 8.4–10.5)
Creatinine, Ser: 1.87 mg/dL — ABNORMAL HIGH (ref 0.50–1.10)
GFR calc non Af Amer: 25 mL/min — ABNORMAL LOW (ref >90)
Glucose, Bld: 215 mg/dL — ABNORMAL HIGH (ref 70–99)

## 2012-06-05 NOTE — Progress Notes (Signed)
[facility]                                                                                Patient Demographics  Brittney [name], is a 78 y.o. female  NWG:[id number]  VHQ:[id number]  DOB - 1931-12-22  Admit date - 05/21/2012  Admitting Physician Gery Pray, MD  Outpatient Primary MD for the patient is FANTA,TESFAYE, MD  LOS - 15   No chief complaint on file.       Assessment & Plan    Brittney [name] is a 78 y.o. female who was transferred to [facility] from the [facility] on 05-21-12 due to a worsening diabetic foot wound of the left foot. She was hospitalized the previous week at [facility]. She has a history of PAD, and had a previous right foot trans metatarsal amputation. After arrival here patient had previous vascular studies including CT angiogram, she also had extensive debridement along with removal of infected bone and tissue done by vascular surgery last week this admission, patient developed acute renal insufficiency due to combination of IV dye injury and antibiotics possibly vancomycin, she also developed acute on chronic diastolic CHF, she was seen by cardiology, nephrology, femoral dialysis catheter was placed and she was started on dialysis on 06/02/2012, plan is long-term to do a femoropopliteal bypass on the left leg once renal function stabilizes.   Patient has poor baseline function currently she is undergoing two-week trial of dialysis to see if renal function stabilizes. I have discussed her case with nephrology who do not want any surgeries to be scheduled for the next 2 weeks. If patient declines per family we will focus on comfort.   Placement was requested through social work and case management on 06/03/2012 but patient is hard to place due to being on acute dialysis.   I have discussed her case with infectious disease physician Dr. Ninetta Lights multiple times plan is to give her a Zyvox through IV based on her intraoperative cultures  growing MRSA for a total of 6 weeks with start date being September 14 (was covered on Vanco and Cubicin prior to this date).     Acute on chronic CKD stage III   -Likely combination of IV contrast and vancomycin toxicity -Renal ultrasound (9/23) negative for hydronephrosis, consistent with medical renal disease  Dialysis started per renal on 06/02/2012 through femoral dialysis catheter, plan is for short course of dialysis for the next few weeks if her renal function does not recover she will be a comfort care. Have discussed her case with nephrology on 9/26 not clear for surgery yet.      Diabetic foot infection/abscess/osteomyelitis foot along with severe PAD  -Surgical intraoperative culture showed MRSA  -Plan 42 days of antibiotics unless patient has BKA (abx start September 14)  -MRI not possible due to the patient's pacemaker  -osteomyelitis due to her surgical findings on September 20,pus extended to metatarsals as noted during intra-op findings-->therefore, she clinically has osteomyelitis , discussed with Dr. Ninetta Lights infectious disease on 06/03/2012 patient okay for Zyvox.   I have discussed her case with infectious disease physician Dr. Ninetta Lights multiple times plan is to give her a  Zyvox through IV based on her intraoperative cultures growing MRSA for a total of 6 weeks with start date being September 14 (was covered on Vanco and Cubicin prior to this date)      Severe peripheral vascular disease  -Revascularization delayed per Dr. Darrick Penna, appreciate followup , she will be a high-risk candidate for the surgery. Procedure has been held till patient is cleared by nephrology for surgery.      Acute on chronic Diastolic CHF  -Appreciate cardiology assistance  -Continue strict I/Os  -positive fluid balance for the hospitalization  -Saline lock IV fluids  -Dialysis is to be started on 9-25 for excess fluid removal     Diabetes mellitus type 2  -CBGs have been well  controlled  -Monitor CBGs as patient has worsening renal failure may increase half-life of insulin  -Patient continues to have poor by mouth intake  -Hemoglobin A1c 7.6 on September 14    CBG (last 3)   Basename 06/05/12 0810 06/04/12 2024 06/04/12 1706  GLUCAP 216* 164* 156*      Hypothyroidism  Increased Synthroid to 88 mcg daily   Lab Results  Component Value Date   TSH 5.714* 05/27/2012      Atrial fib  -does not appear to be good warfarin candidate due to falls, poor insight, and he has paced rhythm on the admission EKG. -ASA after surgeries , goal rate controlled     Severe protein calorie malnutrition  -Pre-albumin 4.0  -beneprotein has been started  -Glucerna supplement has been started      Anemia of CKD  -Iron saturation 11%  -Iron dosing per nephrology     On-and-off mild delirium with underlying early dementia  Patient and family educated, supportive care.     Code Status: Full for now, DO NOT RESUSCITATE if worsens per daughter, we will do comfort care if renal function does not recover in the next few weeks.  Family Communication: Discussed poor prognosis with her daughter and patient, daughter which is then non-heroic treatment, wishes that if patient declines focus on comfort, no long-term life support  Disposition Plan: Be decided  Riley Lam remains poor discussed with daughter if she declines daughter wishes comfort care   Procedures femoral dialysis catheter placement on 06/02/2012 for starting of dialysis   Consults  Vas Surgery, renal   Time Spent in minutes   35   Antibiotics     Anti-infectives     Start     Dose/Rate Route Frequency Ordered Stop   06/03/12 1300   linezolid (ZYVOX) IVPB 600 mg        600 mg 300 mL/hr over 60 Minutes Intravenous Every 12 hours 06/03/12 1247     06/01/12 1800   DAPTOmycin (CUBICIN) 472 mg in sodium chloride 0.9 % IVPB  Status:  Discontinued     Comments: Indication--MRSA osteomyelitis       6 mg/kg  78.7 kg 218.9 mL/hr over 30 Minutes Intravenous Every 48 hours 06/01/12 1700 06/03/12 1247   05/29/12 1200   piperacillin-tazobactam (ZOSYN) IVPB 2.25 g  Status:  Discontinued        2.25 g 100 mL/hr over 30 Minutes Intravenous 4 times per day 05/29/12 0752 06/01/12 1707   05/27/12 1800   vancomycin (VANCOCIN) IVPB 1000 mg/200 mL premix  Status:  Discontinued        1,000 mg 200 mL/hr over 60 Minutes Intravenous Every 24 hours 05/27/12 0035 05/29/12 0742   05/23/12 2000   piperacillin-tazobactam (ZOSYN) IVPB 3.375  g  Status:  Discontinued        3.375 g 12.5 mL/hr over 240 Minutes Intravenous 3 times per day 05/23/12 1919 05/29/12 0752   05/23/12 0000   vancomycin (VANCOCIN) 750 mg in sodium chloride 0.9 % 150 mL IVPB        750 mg 150 mL/hr over 60 Minutes Intravenous Every 24 hours 05/22/12 1051 05/27/12 0218   05/22/12 0000   vancomycin (VANCOCIN) IVPB 1000 mg/200 mL premix  Status:  Discontinued        1,000 mg 200 mL/hr over 60 Minutes Intravenous Every 24 hours 05/21/12 2354 05/22/12 1051          Scheduled Meds:    . docusate sodium  100 mg Oral BID  . feeding supplement  237 mL Oral TID BM  . furosemide  160 mg Oral TID  . heparin  40 Units/kg Dialysis Once in dialysis  . heparin subcutaneous  5,000 Units Subcutaneous Q8H  . insulin aspart  0-5 Units Subcutaneous QHS  . insulin aspart  0-9 Units Subcutaneous TID WC  . insulin glargine  11 Units Subcutaneous QHS  . levothyroxine  88 mcg Oral QAC breakfast  . linezolid  600 mg Intravenous Q12H  . oxyCODONE  10 mg Oral Q12H  . protein supplement  1 scoop Oral TID WC  . senna  2 tablet Oral QHS  . Travoprost (BAK Free)  1 drop Both Eyes QHS   Continuous Infusions:  PRN Meds:.sodium chloride, sodium chloride, acetaminophen, alteplase, feeding supplement (NEPRO CARB STEADY), fentaNYL, heparin, heparin, HYDROmorphone (DILAUDID) injection, lidocaine, lidocaine-prilocaine, ondansetron (ZOFRAN) IV, oxyCODONE,  pentafluoroprop-tetrafluoroeth, sodium chloride, sodium chloride, zolpidem   DVT Prophylaxis  Heparin    Susa Raring K M.D on 06/05/2012 at 9:49 AM  Between 7am to 7pm - Pager - [phone]  After 7pm go to [email or web address] - password [email or web address]  And look for the night coverage person covering for me after hours  [facility] Office  [phone]    Subjective:   Draven Eastmond today has, No headache, No chest pain, No abdominal pain - No Nausea, No new weakness tingling or numbness, does have some cough and mild shortness of breath.  Objective:   Filed Vitals:   06/03/12 2113 06/04/12 0545 06/04/12 1348 06/04/12 2234  BP: 124/44 118/50 119/56 121/59  Pulse: 70 69 71 68  Temp: 98.8 F (37.1 C) 98.2 F (36.8 C) 98.6 F (37 C) 98.5 F (36.9 C)  TempSrc: Oral Oral Oral   Resp: 18 18 16 18   Height:      Weight:  83.5 kg (184 lb 1.4 oz)    SpO2: 97% 98% 97% 98%    Wt Readings from Last 3 Encounters:  06/04/12 83.5 kg (184 lb 1.4 oz)  06/04/12 83.5 kg (184 lb 1.4 oz)  06/04/12 83.5 kg (184 lb 1.4 oz)     Intake/Output Summary (Last 24 hours) at 06/05/12 0949 Last data filed at 06/05/12 0656  Gross per 24 hour  Intake    787 ml  Output   2150 ml  Net  -1363 ml    Exam Awake Alert, Oriented X 2, No new F.N deficits, Normal affect [place].AT,PERRAL Supple Neck,No JVD, No cervical lymphadenopathy appriciated.  Symmetrical Chest wall movement, Good air movement bilaterally, CTAB RRR,No Gallops,Rubs or new Murmurs, No Parasternal Heave +ve B.Sounds, Abd Soft, Non tender, No organomegaly appriciated, No rebound - guarding or rigidity. No Cyanosis, Clubbing or edema, No new Rash or bruise, has wound VAC in  her left foot, right foot old transmetatarsal amputation.   Data Review   Micro Results Recent Results (from the past 240 hour(s))  CULTURE, ROUTINE-ABSCESS     Status: Normal   Collection Time   05/28/12 10:58 AM      Component Value Range Status Comment    Specimen Description ABSCESS FOOT LEFT   Final    Special Requests NONE   Final    Gram Stain     Final    Value: NO WBC SEEN     NO SQUAMOUS EPITHELIAL CELLS SEEN     NO ORGANISMS SEEN   Culture     Final    Value: FEW METHICILLIN RESISTANT STAPHYLOCOCCUS AUREUS     Note: RIFAMPIN AND GENTAMICIN SHOULD NOT BE USED AS SINGLE DRUGS FOR TREATMENT OF STAPH INFECTIONS. CRITICAL RESULT CALLED TO, READ BACK BY AND VERIFIED WITH: PATTY MOSS 06/01/12 1010 BY SMITHERSJ   Report Status 06/01/2012 FINAL   Final    Organism ID, Bacteria METHICILLIN RESISTANT STAPHYLOCOCCUS AUREUS   Final     Radiology Reports Dg Chest 2 View  05/15/2012  *RADIOLOGY REPORT*  Clinical Data: Fever.  CHEST - 2 VIEW  Comparison: 01/24/2012.  Findings: The pacer wires are stable.  The cardiac silhouette, mediastinal and hilar contours are mildly prominent but unchanged. There is persistent central vascular congestion and possible mild interstitial edema.  No pleural effusion or focal infiltrate.  The bony thorax is intact.  IMPRESSION: Stable cardiac enlargement and central vascular congestion with possible mild interstitial edema.   Original Report Authenticated By: P. Loralie Champagne, M.D.    Ct Tibia Fibula Left W Contrast  05/27/2012  *RADIOLOGY REPORT*  Clinical Data:  Diabetic with foot pain, erythema and swelling. Possible abscess.  CT OF THE LEFT LOWER LEG WITH CONTRAST  Technique:  Multidetector CT imaging of the left lower leg was performed according to the standard protocol following intravenous contrast administration. Multiplanar CT image reconstructions were also generated.  Contrast:  80 ml Omnipaque-300  Comparison:  Foot radiographs 05/15/2012 and today.  Findings:  Imaging was performed from the proximal left lower leg through the left foot as specifically requested.  There is an ill-defined peripherally enhancing fluid collection within the fourth webspace of the foot.  This contains a small air bubble and measures  up to 2.1 cm in greatest dimension. Fluid extends distally into the fourth toe.  There is some additional soft tissue emphysema tracking medially within the plantar forefoot within the plantar aspects of the second, third and fourth toes. No other measurable fluid collections are seen.  There is subcutaneous edema throughout the foot to extending into the distal lower leg.  Superficial varicosities are noted within the lower leg.  Bone windows demonstrate no cortical destruction or significant arthropathic changes.  IMPRESSION:  1.  There is an ill-defined fluid collection within the fourth webspace with distal extension into the fourth toe and along the plantar aspects of the second and third toes.  This collection demonstrates peripheral enhancement and air bubbles and is consistent with an abscess. 2.  No CT evidence of osteomyelitis. 3.  Nonspecific subcutaneous edema in the distal lower leg and plantar foot.   Original Report Authenticated By: [name], M.D.    US Renal  05/31/2012  *RADIOLOGY REPORT*  Clinical Data: Acute on chronic renal failure  RENAL/URINARY TRACT ULTRASOUND COMPLETE  Comparison:  Abdominal ultrasound dated 11/15/2009  Findings:  Right Kidney:  Measures 10.2 cm.  Echogenic renal parenchyma,  likely reflecting medical renal disease.  No mass or hydronephrosis.  Left Kidney:  Measures 9.9 cm.  Echogenic renal parenchyma, likely reflecting medical renal disease.  Bladder:  Decompressed by indwelling Foley catheter.  IMPRESSION: Echogenic renal parenchyma, likely reflecting medical renal disease.  No hydronephrosis.  Bladder decompressed by indwelling Foley catheter.   Original Report Authenticated By: Charline Bills, M.D.    Dg Chest Port 1 View  05/29/2012  *RADIOLOGY REPORT*  Clinical Data: Evaluate for congestive heart failure.  PORTABLE CHEST - 1 VIEW  Comparison: Chest x-ray 05/25/2012.  Findings: Lung volumes are low.  No consolidative airspace disease. Probable trace  bilateral pleural effusions.  Pulmonary venous congestion without frank pulmonary edema.  Mild cardiomegaly is unchanged. The patient is rotated to the left on today's exam, resulting in distortion of the mediastinal contours and reduced diagnostic sensitivity and specificity for mediastinal pathology. Atherosclerosis of the thoracic aorta. There is a right upper extremity PICC with tip terminating in the distal superior vena cava. Left-sided pacemaker device in place with lead tips projecting over the expected location of the right atrium and right ventricular apex.  IMPRESSION: 1.  Mild cardiomegaly with pulmonary venous congestion, but no frank pulmonary edema. 2.  Trace bilateral pleural effusions. 3.  Atherosclerosis. 4.  Support apparatus, as above.   Original Report Authenticated By: Florencia Reasons, M.D.    Dg Chest Port 1 View  05/25/2012  *RADIOLOGY REPORT*  Clinical Data: PICC line placement.  PORTABLE CHEST - 1 VIEW  Comparison: 05/21/2012  Findings: Right-sided PICC line noted with tip projecting over the SVC.  No pneumothorax or complicating feature.  Dual lead pacer noted.  There is mild cardiomegaly along with stable interstitial accentuation.  Peripheral airspace opacity in the right mid lung appears slightly less striking.  Atherosclerotic calcification in the aortic arch noted.  IMPRESSION:  1.  Right PICC line tip:  SVC.  No pneumothorax or complicating feature. 2.  Cardiomegaly with mild interstitial accentuation, suspicious for mild interstitial edema. 3.  Faint airspace disease peripherally in the right mid lung appears improved.   Original Report Authenticated By: [name], M.D.    Dg Foot 2 Views Left  05/27/2012  *RADIOLOGY REPORT*  Clinical Data: Diabetic foot infection  LEFT FOOT - 2 VIEW  Comparison: 05/15/2012  Findings: Scattered soft tissue swelling. Diffuse osseous demineralization. Foci of soft tissue gas identified between the bases of the fourth and fifth  toes. Joint spaces preserved. Hallux valgus. No acute fracture, dislocation or bone destruction.  IMPRESSION: Soft tissue gas between the bases of the fourth and fifth toes consistent with soft tissue infection. No definite evidence of osteomyelitis. If further soft tissue assessment needed or persistent clinical concern for occult osteomyelitis remains, consider MR imaging of the left foot.   Original Report Authenticated By: [name], M.D.    Dg Foot Complete Left  05/15/2012  *RADIOLOGY REPORT*  Clinical Data: Diabetic foot ulcers.  LEFT FOOT - COMPLETE 3+ VIEW  Comparison: None  Findings: The joint spaces are maintained.  No acute bony findings or destructive bony changes.  IMPRESSION: No plain film findings for osteomyelitis.   Original Report Authenticated By: P. Loralie Champagne, M.D.     CBC  Lab 06/05/12 0435 06/04/12 0629 06/03/12 1045 06/02/12 0532 06/01/12 0532  WBC 14.1* 14.1* 17.3* 13.6* 11.8*  HGB 9.0* 8.6* 8.9* 9.0* 8.7*  HCT 27.8* 27.3* 28.0* 28.4* 28.3*  PLT 406* 369 384 362 336  MCV 90.0 91.9 91.2 91.9  91.3  MCH 29.1 29.0 29.0 29.1 28.1  MCHC 32.4 31.5 31.8 31.7 30.7  RDW 14.9 15.1 15.1 15.0 15.0  LYMPHSABS -- -- -- -- --  MONOABS -- -- -- -- --  EOSABS -- -- -- -- --  BASOSABS -- -- -- -- --  BANDABS -- -- -- -- --    Chemistries   Lab 06/05/12 0435 06/04/12 0629 06/03/12 1045 06/02/12 0532 06/01/12 0532  NA 130* 132* 133* 134* 134*  K 4.0 3.7 4.4 4.9 4.6  CL 92* 94* 95* 94* 95*  CO2 30 31 27 29 31   GLUCOSE 215* 258* 246* 219* 128*  BUN 30* 25* 34* 49* 41*  CREATININE 1.87* 2.05* 3.23* 4.72* 4.18*  CALCIUM 9.2 8.8 9.1 9.2 9.0  MG -- -- -- -- --  AST -- -- -- -- --  ALT -- -- -- -- --  ALKPHOS -- -- -- -- --  BILITOT -- -- -- -- --   ------------------------------------------------------------------------------------------------------------------ estimated creatinine clearance is 27.6 ml/min (by C-G formula based on Cr of  1.87). ------------------------------------------------------------------------------------------------------------------ No results found for this basename: HGBA1C:2 in the last 72 hours ------------------------------------------------------------------------------------------------------------------ No results found for this basename: CHOL:2,HDL:2,LDLCALC:2,TRIG:2,CHOLHDL:2,LDLDIRECT:2 in the last 72 hours ------------------------------------------------------------------------------------------------------------------ No results found for this basename: TSH,T4TOTAL,FREET3,T3FREE,THYROIDAB in the last 72 hours ------------------------------------------------------------------------------------------------------------------ No results found for this basename: VITAMINB12:2,FOLATE:2,FERRITIN:2,TIBC:2,IRON:2,RETICCTPCT:2 in the last 72 hours  Coagulation profile  Lab 06/01/12 0532  INR 1.22  PROTIME --    No results found for this basename: DDIMER:2 in the last 72 hours  Cardiac Enzymes No results found for this basename: CK:3,CKMB:3,TROPONINI:3,MYOGLOBIN:3 in the last 168 hours ------------------------------------------------------------------------------------------------------------------ No components found with this basename: POCBNP:3

## 2012-06-05 NOTE — Progress Notes (Signed)
Subjective: Interval History: none.  Objective: Vital signs in last 24 hours: Temp:  [98.5 F (36.9 C)-98.6 F (37 C)] 98.5 F (36.9 C) (09/27 2234) Pulse Rate:  [68-71] 68  (09/27 2234) Resp:  [16-18] 18  (09/27 2234) BP: (119-121)/(56-59) 121/59 mmHg (09/27 2234) SpO2:  [97 %-98 %] 98 % (09/27 2234) Weight change:   Intake/Output from previous day: 09/27 0701 - 09/28 0700 In: 787 [I.V.:487; IV Piggyback:300] Out: 2150 [Urine:2150] Intake/Output this shift:    General appearance: cooperative, pale and slowed mentation Resp: rales bibasilar Cardio: S1, S2 normal and systolic murmur: holosystolic 2/6, blowing at apex GI: soft, pos bs, liver down 4 cm Extremities: edema 3+, TMA bilat, Vac on L and Fem cath  Lab Results:  [facility] 06/05/12 0435 06/04/12 0629  WBC 14.1* 14.1*  HGB 9.0* 8.6*  HCT 27.8* 27.3*  PLT 406* 369   BMET:  Basename 06/05/12 0435 06/04/12 0629  NA 130* 132*  K 4.0 3.7  CL 92* 94*  CO2 30 31  GLUCOSE 215* 258*  BUN 30* 25*  CREATININE 1.87* 2.05*  CALCIUM 9.2 8.8   No results found for this basename: PTH:2 in the last 72 hours Iron Studies: No results found for this basename: IRON,TIBC,TRANSFERRIN,FERRITIN in the last 72 hours  Studies/Results: No results found.  I have reviewed the patient's current medications.  Assessment/Plan: 1 AKI contrast.  Vol xs, diuresing. Cont Lasix as still vol xs.  Improving.  If cont to improve, d/c cath 2 PVD per [facility]VS 3 Anemia stable 4 Cad 5 Dementia 6 HTN not an issue 7 DM good control 8 MS improving  P Lasix, fe, epo follow Cr, vol    LOS: 15 days   Rodarius Kichline L 06/05/2012,10:12 AM

## 2012-06-05 NOTE — Progress Notes (Signed)
Disimpacted pt medium amount bm.  Gave soap suds enema, with good results.

## 2012-06-06 LAB — RENAL FUNCTION PANEL
Albumin: 2 g/dL — ABNORMAL LOW (ref 3.5–5.2)
Chloride: 89 mEq/L — ABNORMAL LOW (ref 96–112)
GFR calc Af Amer: 34 mL/min — ABNORMAL LOW (ref >90)
GFR calc non Af Amer: 29 mL/min — ABNORMAL LOW (ref >90)
Phosphorus: 3 mg/dL (ref 2.3–4.6)
Potassium: 3.4 mEq/L — ABNORMAL LOW (ref 3.5–5.1)
Sodium: 131 mEq/L — ABNORMAL LOW (ref 135–145)

## 2012-06-06 LAB — GLUCOSE, CAPILLARY
Glucose-Capillary: 184 mg/dL — ABNORMAL HIGH (ref 70–99)
Glucose-Capillary: 267 mg/dL — ABNORMAL HIGH (ref 70–99)
Glucose-Capillary: 221 mg/dL — ABNORMAL HIGH (ref 70–99)

## 2012-06-06 LAB — CBC
Hemoglobin: 9.7 g/dL — ABNORMAL LOW (ref 12.0–15.0)
MCH: 29.1 pg (ref 26.0–34.0)
MCHC: 32.4 g/dL (ref 30.0–36.0)
RDW: 15.3 % (ref 11.5–15.5)

## 2012-06-06 MED ORDER — POTASSIUM CHLORIDE 20 MEQ/15ML (10%) PO LIQD
40.0000 meq | Freq: Two times a day (BID) | ORAL | Status: AC
  Administered 2012-06-06 (×2): 40 meq via ORAL
  Filled 2012-06-06 (×4): qty 30

## 2012-06-06 NOTE — Progress Notes (Signed)
[facility]                                                                                Patient Demographics  Brittney [name], is a 78 y.o. female  ZOX:[id number]  [id number]  DOB - 10-06-1931  Admit date - 05/21/2012  Admitting Physician Gery Pray, MD  Outpatient Primary MD for the patient is FANTA,TESFAYE, MD  LOS - 16   No chief complaint on file.       Assessment & Plan    Rosamund R Strand is a 78 y.o. female who was transferred to [facility] from the [facility] on 05-21-12 due to a worsening diabetic foot wound of the left foot. She was hospitalized the previous week at [facility] ([facility] She has a history of PAD, and had a previous right foot trans metatarsal amputation. After arrival here patient had previous vascular studies including CT angiogram, she also had extensive debridement along with removal of infected bone and tissue done by vascular surgery last week this admission, patient developed acute renal insufficiency due to combination of IV dye injury and antibiotics possibly vancomycin, she also developed acute on chronic diastolic CHF, she was seen by cardiology, nephrology, femoral dialysis catheter was placed and she was started on dialysis on 06/02/2012, plan is long-term to do a femoropopliteal bypass on the left leg once renal function stabilizes and patient is cleared for surgery by nephrology.   Patient has poor baseline function currently she is undergoing two-week trial of dialysis to see if renal function stabilizes. I have discussed her case with nephrology who do not want any surgeries to be scheduled for the next 2 weeks. If patient declines per family we will focus on comfort.   Placement was requested through social work and case management on 06/03/2012 but patient is hard to place due to being on acute dialysis.   I have discussed her case with infectious disease physician Dr. Ninetta Lights multiple times plan is to give her a Zyvox  through IV based on her intraoperative cultures growing MRSA for a total of 6 weeks with start date being September 14 (was covered on Vanco and Cubicin prior to this date).     Acute on chronic CKD stage III   -Likely combination of IV contrast and vancomycin toxicity -Renal ultrasound (9/23) negative for hydronephrosis, consistent with medical renal disease  Dialysis started per renal on 06/02/2012 through femoral dialysis catheter, on 06/06/2012 patient has now good urine output, likely further dialysis will be held, nephrology to see. If no further dialysis femoral line will be removed.    Diabetic foot infection/abscess/osteomyelitis foot along with severe PAD  -Surgical intraoperative culture showed MRSA  -Plan 42 days of antibiotics unless patient has BKA (abx start September 14)  -MRI not possible due to the patient's pacemaker  -osteomyelitis due to her surgical findings on September 20,pus extended to metatarsals as noted during intra-op findings-->therefore, she clinically has osteomyelitis , discussed with Dr. Ninetta Lights infectious disease on 06/03/2012 patient okay for Zyvox.   I have discussed her case with infectious disease physician Dr. Ninetta Lights multiple times plan is to give her a Zyvox through IV based on her  intraoperative cultures growing MRSA for a total of 6 weeks with start date being September 14 (was covered on Vanco and Cubicin prior to this date)      Severe peripheral vascular disease  -Revascularization delayed per Dr. Darrick Penna, appreciate followup , she will be a high-risk candidate for the surgery. Procedure has been held till patient is cleared by nephrology for surgery.      Acute on chronic Diastolic CHF  -Appreciate cardiology assistance  -Continue strict I/Os  -positive fluid balance for the hospitalization  -Saline lock IV fluids  -Dialysis is to be started on 9-25 for excess fluid removal     Diabetes mellitus type 2  -CBGs have been well  controlled  -Monitor CBGs as patient has worsening renal failure may increase half-life of insulin  -Patient continues to have poor by mouth intake  -Hemoglobin A1c 7.6 on September 14    CBG (last 3)   Basename 06/05/12 2210 06/05/12 1723 06/05/12 1225  GLUCAP 174* 223* 288*      Hypothyroidism  Increased Synthroid to 88 mcg daily   Lab Results  Component Value Date   TSH 5.714* 05/27/2012      Atrial fib  -does not appear to be good warfarin candidate due to falls, poor insight, and he has paced rhythm on the admission EKG. -ASA after surgeries , goal rate controlled     Severe protein calorie malnutrition  -Pre-albumin 4.0  -beneprotein has been started  -Glucerna supplement has been started      Anemia of CKD  -Iron saturation 11%  -Iron dosing per nephrology     On-and-off mild delirium with underlying early dementia  Patient and family educated, supportive care.     Code Status: Full for now, DO NOT RESUSCITATE if worsens per daughter, we will do comfort care if renal function does not recover in the next few weeks.  Family Communication: Discussed poor prognosis with her daughter and patient, daughter which is then non-heroic treatment, wishes that if patient declines focus on comfort, no long-term life support  Disposition Plan: Be decided  Riley Lam remains poor discussed with daughter if she declines daughter wishes comfort care   Procedures femoral dialysis catheter placement on 06/02/2012 for starting of dialysis   Consults  Vas Surgery, renal   Time Spent in minutes   35   Antibiotics     Anti-infectives     Start     Dose/Rate Route Frequency Ordered Stop   06/03/12 1300   linezolid (ZYVOX) IVPB 600 mg        600 mg 300 mL/hr over 60 Minutes Intravenous Every 12 hours 06/03/12 1247     06/01/12 1800   DAPTOmycin (CUBICIN) 472 mg in sodium chloride 0.9 % IVPB  Status:  Discontinued     Comments: Indication--MRSA osteomyelitis       6 mg/kg  78.7 kg 218.9 mL/hr over 30 Minutes Intravenous Every 48 hours 06/01/12 1700 06/03/12 1247   05/29/12 1200   piperacillin-tazobactam (ZOSYN) IVPB 2.25 g  Status:  Discontinued        2.25 g 100 mL/hr over 30 Minutes Intravenous 4 times per day 05/29/12 0752 06/01/12 1707   05/27/12 1800   vancomycin (VANCOCIN) IVPB 1000 mg/200 mL premix  Status:  Discontinued        1,000 mg 200 mL/hr over 60 Minutes Intravenous Every 24 hours 05/27/12 0035 05/29/12 0742   05/23/12 2000   piperacillin-tazobactam (ZOSYN) IVPB 3.375 g  Status:  Discontinued  3.375 g 12.5 mL/hr over 240 Minutes Intravenous 3 times per day 05/23/12 1919 05/29/12 0752   05/23/12 0000   vancomycin (VANCOCIN) 750 mg in sodium chloride 0.9 % 150 mL IVPB        750 mg 150 mL/hr over 60 Minutes Intravenous Every 24 hours 05/22/12 1051 05/27/12 0218   05/22/12 0000   vancomycin (VANCOCIN) IVPB 1000 mg/200 mL premix  Status:  Discontinued        1,000 mg 200 mL/hr over 60 Minutes Intravenous Every 24 hours 05/21/12 2354 05/22/12 1051          Scheduled Meds:    . docusate sodium  100 mg Oral BID  . feeding supplement  237 mL Oral TID BM  . heparin  40 Units/kg Dialysis Once in dialysis  . heparin subcutaneous  5,000 Units Subcutaneous Q8H  . insulin aspart  0-5 Units Subcutaneous QHS  . insulin aspart  0-9 Units Subcutaneous TID WC  . insulin glargine  11 Units Subcutaneous QHS  . levothyroxine  88 mcg Oral QAC breakfast  . linezolid  600 mg Intravenous Q12H  . oxyCODONE  10 mg Oral Q12H  . potassium chloride  40 mEq Oral BID  . protein supplement  1 scoop Oral TID WC  . senna  2 tablet Oral QHS  . Travoprost (BAK Free)  1 drop Both Eyes QHS  . DISCONTD: furosemide  160 mg Oral TID   Continuous Infusions:  PRN Meds:.sodium chloride, sodium chloride, acetaminophen, feeding supplement (NEPRO CARB STEADY), fentaNYL, heparin, heparin, HYDROmorphone (DILAUDID) injection, lidocaine,  lidocaine-prilocaine, ondansetron (ZOFRAN) IV, oxyCODONE, pentafluoroprop-tetrafluoroeth, sodium chloride, sodium chloride, zolpidem   DVT Prophylaxis  Heparin    Susa Raring K M.D on 06/06/2012 at 10:54 AM  Between 7am to 7pm - Pager - [phone]  After 7pm go to [email or web address] - password [email or web address]  And look for the night coverage person covering for me after hours  [facility] Office  [phone]    Subjective:   Bernadean Nugen today has, No headache, No chest pain, No abdominal pain - No Nausea, No new weakness tingling or numbness, does have some cough and mild shortness of breath.  Objective:   Filed Vitals:   06/05/12 1446 06/05/12 2158 06/06/12 0606 06/06/12 0647  BP: 146/62 150/69  148/65  Pulse: 70 68  75  Temp: 98.5 F (36.9 C) 98.7 F (37.1 C)  98.4 F (36.9 C)  TempSrc: Oral     Resp: 18 18  18   Height:      Weight:   79.6 kg (175 lb 7.8 oz)   SpO2: 98% 97%  98%    Wt Readings from Last 3 Encounters:  06/06/12 79.6 kg (175 lb 7.8 oz)  06/06/12 79.6 kg (175 lb 7.8 oz)  06/06/12 79.6 kg (175 lb 7.8 oz)     Intake/Output Summary (Last 24 hours) at 06/06/12 1054 Last data filed at 06/06/12 2956  Gross per 24 hour  Intake    758 ml  Output   5801 ml  Net  -5043 ml    Exam Awake Alert, Oriented X 2, No new F.N deficits, Normal affect [place].AT,PERRAL Supple Neck,No JVD, No cervical lymphadenopathy appriciated.  Symmetrical Chest wall movement, Good air movement bilaterally, CTAB RRR,No Gallops,Rubs or new Murmurs, No Parasternal Heave +ve B.Sounds, Abd Soft, Non tender, No organomegaly appriciated, No rebound - guarding or rigidity. No Cyanosis, Clubbing or edema, No new Rash or bruise, has wound VAC in her left foot, right foot old  transmetatarsal amputation.   Data Review   Micro Results Recent Results (from the past 240 hour(s))  CULTURE, ROUTINE-ABSCESS     Status: Normal   Collection Time   05/28/12 10:58 AM      Component Value  Range Status Comment   Specimen Description ABSCESS FOOT LEFT   Final    Special Requests NONE   Final    Gram Stain     Final    Value: NO WBC SEEN     NO SQUAMOUS EPITHELIAL CELLS SEEN     NO ORGANISMS SEEN   Culture     Final    Value: FEW METHICILLIN RESISTANT STAPHYLOCOCCUS AUREUS     Note: RIFAMPIN AND GENTAMICIN SHOULD NOT BE USED AS SINGLE DRUGS FOR TREATMENT OF STAPH INFECTIONS. CRITICAL RESULT CALLED TO, READ BACK BY AND VERIFIED WITH: PATTY MOSS 06/01/12 1010 BY SMITHERSJ   Report Status 06/01/2012 FINAL   Final    Organism ID, Bacteria METHICILLIN RESISTANT STAPHYLOCOCCUS AUREUS   Final     Radiology Reports Dg Chest 2 View  05/15/2012  *RADIOLOGY REPORT*  Clinical Data: Fever.  CHEST - 2 VIEW  Comparison: 01/24/2012.  Findings: The pacer wires are stable.  The cardiac silhouette, mediastinal and hilar contours are mildly prominent but unchanged. There is persistent central vascular congestion and possible mild interstitial edema.  No pleural effusion or focal infiltrate.  The bony thorax is intact.  IMPRESSION: Stable cardiac enlargement and central vascular congestion with possible mild interstitial edema.   Original Report Authenticated By: P. Loralie Champagne, M.D.    Ct Tibia Fibula Left W Contrast  05/27/2012  *RADIOLOGY REPORT*  Clinical Data:  Diabetic with foot pain, erythema and swelling. Possible abscess.  CT OF THE LEFT LOWER LEG WITH CONTRAST  Technique:  Multidetector CT imaging of the left lower leg was performed according to the standard protocol following intravenous contrast administration. Multiplanar CT image reconstructions were also generated.  Contrast:  80 ml Omnipaque-300  Comparison:  Foot radiographs 05/15/2012 and today.  Findings:  Imaging was performed from the proximal left lower leg through the left foot as specifically requested.  There is an ill-defined peripherally enhancing fluid collection within the fourth webspace of the foot.  This contains a small  air bubble and measures up to 2.1 cm in greatest dimension. Fluid extends distally into the fourth toe.  There is some additional soft tissue emphysema tracking medially within the plantar forefoot within the plantar aspects of the second, third and fourth toes. No other measurable fluid collections are seen.  There is subcutaneous edema throughout the foot to extending into the distal lower leg.  Superficial varicosities are noted within the lower leg.  Bone windows demonstrate no cortical destruction or significant arthropathic changes.  IMPRESSION:  1.  There is an ill-defined fluid collection within the fourth webspace with distal extension into the fourth toe and along the plantar aspects of the second and third toes.  This collection demonstrates peripheral enhancement and air bubbles and is consistent with an abscess. 2.  No CT evidence of osteomyelitis. 3.  Nonspecific subcutaneous edema in the distal lower leg and plantar foot.   Original Report Authenticated By: [name], M.D.    US Renal  05/31/2012  *RADIOLOGY REPORT*  Clinical Data: Acute on chronic renal failure  RENAL/URINARY TRACT ULTRASOUND COMPLETE  Comparison:  Abdominal ultrasound dated 11/15/2009  Findings:  Right Kidney:  Measures 10.2 cm.  Echogenic renal parenchyma, likely reflecting medical renal disease.  No mass or hydronephrosis.  Left Kidney:  Measures 9.9 cm.  Echogenic renal parenchyma, likely reflecting medical renal disease.  Bladder:  Decompressed by indwelling Foley catheter.  IMPRESSION: Echogenic renal parenchyma, likely reflecting medical renal disease.  No hydronephrosis.  Bladder decompressed by indwelling Foley catheter.   Original Report Authenticated By: Charline Bills, M.D.    Dg Chest Port 1 View  05/29/2012  *RADIOLOGY REPORT*  Clinical Data: Evaluate for congestive heart failure.  PORTABLE CHEST - 1 VIEW  Comparison: Chest x-ray 05/25/2012.  Findings: Lung volumes are low.  No consolidative airspace  disease. Probable trace bilateral pleural effusions.  Pulmonary venous congestion without frank pulmonary edema.  Mild cardiomegaly is unchanged. The patient is rotated to the left on today's exam, resulting in distortion of the mediastinal contours and reduced diagnostic sensitivity and specificity for mediastinal pathology. Atherosclerosis of the thoracic aorta. There is a right upper extremity PICC with tip terminating in the distal superior vena cava. Left-sided pacemaker device in place with lead tips projecting over the expected location of the right atrium and right ventricular apex.  IMPRESSION: 1.  Mild cardiomegaly with pulmonary venous congestion, but no frank pulmonary edema. 2.  Trace bilateral pleural effusions. 3.  Atherosclerosis. 4.  Support apparatus, as above.   Original Report Authenticated By: Florencia Reasons, M.D.    Dg Chest Port 1 View  05/25/2012  *RADIOLOGY REPORT*  Clinical Data: PICC line placement.  PORTABLE CHEST - 1 VIEW  Comparison: 05/21/2012  Findings: Right-sided PICC line noted with tip projecting over the SVC.  No pneumothorax or complicating feature.  Dual lead pacer noted.  There is mild cardiomegaly along with stable interstitial accentuation.  Peripheral airspace opacity in the right mid lung appears slightly less striking.  Atherosclerotic calcification in the aortic arch noted.  IMPRESSION:  1.  Right PICC line tip:  SVC.  No pneumothorax or complicating feature. 2.  Cardiomegaly with mild interstitial accentuation, suspicious for mild interstitial edema. 3.  Faint airspace disease peripherally in the right mid lung appears improved.   Original Report Authenticated By: [name], M.D.    Dg Foot 2 Views Left  05/27/2012  *RADIOLOGY REPORT*  Clinical Data: Diabetic foot infection  LEFT FOOT - 2 VIEW  Comparison: 05/15/2012  Findings: Scattered soft tissue swelling. Diffuse osseous demineralization. Foci of soft tissue gas identified between the bases of  the fourth and fifth toes. Joint spaces preserved. Hallux valgus. No acute fracture, dislocation or bone destruction.  IMPRESSION: Soft tissue gas between the bases of the fourth and fifth toes consistent with soft tissue infection. No definite evidence of osteomyelitis. If further soft tissue assessment needed or persistent clinical concern for occult osteomyelitis remains, consider MR imaging of the left foot.   Original Report Authenticated By: [name], M.D.    Dg Foot Complete Left  05/15/2012  *RADIOLOGY REPORT*  Clinical Data: Diabetic foot ulcers.  LEFT FOOT - COMPLETE 3+ VIEW  Comparison: None  Findings: The joint spaces are maintained.  No acute bony findings or destructive bony changes.  IMPRESSION: No plain film findings for osteomyelitis.   Original Report Authenticated By: P. Loralie Champagne, M.D.     CBC  Lab 06/06/12 0440 06/05/12 0435 06/04/12 0629 06/03/12 1045 06/02/12 0532  WBC 13.8* 14.1* 14.1* 17.3* 13.6*  HGB 9.7* 9.0* 8.6* 8.9* 9.0*  HCT 29.9* 27.8* 27.3* 28.0* 28.4*  PLT 388 406* 369 384 362  MCV 89.8 90.0 91.9 91.2 91.9  MCH 29.1 29.1 29.0  29.0 29.1  MCHC 32.4 32.4 31.5 31.8 31.7  RDW 15.3 14.9 15.1 15.1 15.0  LYMPHSABS -- -- -- -- --  MONOABS -- -- -- -- --  EOSABS -- -- -- -- --  BASOSABS -- -- -- -- --  BANDABS -- -- -- -- --    Chemistries   Lab 06/06/12 0440 06/05/12 0435 06/04/12 0629 06/03/12 1045 06/02/12 0532  NA 131* 130* 132* 133* 134*  K 3.4* 4.0 3.7 4.4 4.9  CL 89* 92* 94* 95* 94*  CO2 36* 30 31 27 29   GLUCOSE 215* 215* 258* 246* 219*  BUN 31* 30* 25* 34* 49*  CREATININE 1.63* 1.87* 2.05* 3.23* 4.72*  CALCIUM 9.4 9.2 8.8 9.1 9.2  MG -- -- -- -- --  AST -- -- -- -- --  ALT -- -- -- -- --  ALKPHOS -- -- -- -- --  BILITOT -- -- -- -- --   ------------------------------------------------------------------------------------------------------------------ estimated creatinine clearance is 30.9 ml/min (by C-G formula based on Cr of  1.63). ------------------------------------------------------------------------------------------------------------------ No results found for this basename: HGBA1C:2 in the last 72 hours ------------------------------------------------------------------------------------------------------------------ No results found for this basename: CHOL:2,HDL:2,LDLCALC:2,TRIG:2,CHOLHDL:2,LDLDIRECT:2 in the last 72 hours ------------------------------------------------------------------------------------------------------------------ No results found for this basename: TSH,T4TOTAL,FREET3,T3FREE,THYROIDAB in the last 72 hours ------------------------------------------------------------------------------------------------------------------ No results found for this basename: VITAMINB12:2,FOLATE:2,FERRITIN:2,TIBC:2,IRON:2,RETICCTPCT:2 in the last 72 hours  Coagulation profile  Lab 06/01/12 0532  INR 1.22  PROTIME --    No results found for this basename: DDIMER:2 in the last 72 hours  Cardiac Enzymes No results found for this basename: CK:3,CKMB:3,TROPONINI:3,MYOGLOBIN:3 in the last 168 hours ------------------------------------------------------------------------------------------------------------------ No components found with this basename: POCBNP:3

## 2012-06-06 NOTE — Progress Notes (Signed)
Daily Renal Progress Note  Subjective:   No acute events overnight.   Objective:   BP 148/65  Pulse 75  Temp 98.4 F (36.9 C) (Oral)  Resp 18  Ht 5\' 7"  (1.702 m)  Wt 175 lb 7.8 oz (79.6 kg)  BMI 27.49 kg/m2  SpO2 98%  Intake/Output Summary (Last 24 hours) at 06/06/12 0709 Last data filed at 06/06/12 1610  Gross per 24 hour  Intake    758 ml  Output   5801 ml  Net  -5043 ml   Weight change:   Physical Exam: Gen: Sleeping in bed, comfortable. Awakes easily. NAD CVS: RRR Resp: CTAB. Unlabored breathing Abd: Soft, nontender. +dependent edema of flanks Ext: Diffusely edematous. Wound vac left foot. Right forefoot surgically absent  Imaging: No results found. Labs: BMET  Lab 06/06/12 0440 06/05/12 0435 06/04/12 0629 06/03/12 1045 06/02/12 0532 06/01/12 0532 05/31/12 1437 05/31/12 0610  NA 131* 130* 132* 133* 134* 134* -- 133*  K 3.4* 4.0 3.7 4.4 4.9 4.6 -- 4.3  CL 89* 92* 94* 95* 94* 95* -- 93*  CO2 36* 30 31 27 29 31  -- 31  GLUCOSE 215* 215* 258* 246* 219* 128* -- 157*  BUN 31* 30* 25* 34* 49* 41* -- 34*  CREATININE 1.63* 1.87* 2.05* 3.23* 4.72* 4.18* -- 3.43*  ALB -- -- -- -- -- -- -- --  CALCIUM 9.4 9.2 8.8 9.1 9.2 9.0 -- 9.0  PHOS 3.0 -- -- -- 6.3* 5.2* 4.7* --   CBC  Lab 06/06/12 0440 06/05/12 0435 06/04/12 0629 06/03/12 1045  WBC 13.8* 14.1* 14.1* 17.3*  NEUTROABS -- -- -- --  HGB 9.7* 9.0* 8.6* 8.9*  HCT 29.9* 27.8* 27.3* 28.0*  MCV 89.8 90.0 91.9 91.2  PLT 388 406* 369 384    Medications:       . docusate sodium  100 mg Oral BID  . feeding supplement  237 mL Oral TID BM  . furosemide  160 mg Oral TID  . heparin  40 Units/kg Dialysis Once in dialysis  . heparin subcutaneous  5,000 Units Subcutaneous Q8H  . insulin aspart  0-5 Units Subcutaneous QHS  . insulin aspart  0-9 Units Subcutaneous TID WC  . insulin glargine  11 Units Subcutaneous QHS  . levothyroxine  88 mcg Oral QAC breakfast  . linezolid  600 mg Intravenous Q12H  . oxyCODONE  10 mg  Oral Q12H  . protein supplement  1 scoop Oral TID WC  . senna  2 tablet Oral QHS  . Travoprost (BAK Free)  1 drop Both Eyes QHS    Assessment/ Plan:   Pt is a 78 y.o. yo female with a PMHX of HTN, DM, CAD, A.fib, PVD, CKD with baseline Cr 1.0-1.3 who was transferred to [facility] on 05/21/2012 from [facility] for evaluation of worsening diabetic foot wound, now s/p I&D of abscess and amputation of digits 3,4,5.   ARF- Patient with CKD, now with elevated creatinine. Differential includes volume overload, drug toxicity with supertherapeutic vanc level, but most likely due to contrasted study in setting of CKD and DM. Started on HD on 05/23/12 x1 treatment. - UOP improving. Cr 1.63 this morning - Strict I/O's  - Avoid further contrast and nephrotoxic drugs including NSAIDs. Would also recommend postponing large surgical procedures for now as well - Renal panel in the AM   Anemia- Appears she does not have chronic anemia, but her HgB has been trending down post-operatively. Iron is low at 23 with sat  of 11 - s/p feraheme - Monitor HgB daily   Foot infection- Possible osteomyelitis based on clinical findings. VSS following. Patient went to OR 9/20 for debridement as well as amputation of digits.  - Continue abx, per primary team  CHF/A.fib- Followed by Cardiology.  - Rate is controlled at this time with pacemaker   PAD- VSS planning for bypass, which has now been postponed.   DM- Per primary team. A1c 7.6. CBG controlled with SSI at this time   Nutrition- Albumin low at 1.9, and has been low for a while  - Continue nutrition supplementation   Dispo- Will d/c lasix and follow UOP. Replete K+ PO x2. Will d/c femoral line  [name], MD 06/06/2012, 7:09 AM I have seen and examined this patient and agree with the plan of care  Seen and eval. Vol better,dirusing, can hold at this time. Still xs vol, but with alk and low k, replete k and Cl. .  Jaylea Plourde L 06/06/2012, 9:14 AM

## 2012-06-07 LAB — CBC
Platelets: 415 10*3/uL — ABNORMAL HIGH (ref 150–400)
RDW: 15.7 % — ABNORMAL HIGH (ref 11.5–15.5)
WBC: 13.6 10*3/uL — ABNORMAL HIGH (ref 4.0–10.5)

## 2012-06-07 LAB — BASIC METABOLIC PANEL
Chloride: 88 mEq/L — ABNORMAL LOW (ref 96–112)
GFR calc Af Amer: 33 mL/min — ABNORMAL LOW (ref >90)
Potassium: 4.3 mEq/L (ref 3.5–5.1)
Sodium: 133 mEq/L — ABNORMAL LOW (ref 135–145)

## 2012-06-07 LAB — GLUCOSE, CAPILLARY
Glucose-Capillary: 116 mg/dL — ABNORMAL HIGH (ref 70–99)
Glucose-Capillary: 298 mg/dL — ABNORMAL HIGH (ref 70–99)

## 2012-06-07 MED ORDER — IBUPROFEN 600 MG PO TABS
600.0000 mg | ORAL_TABLET | Freq: Once | ORAL | Status: AC
  Administered 2012-06-07: 600 mg via ORAL
  Filled 2012-06-07: qty 1

## 2012-06-07 MED ORDER — LINEZOLID 600 MG PO TABS
600.0000 mg | ORAL_TABLET | Freq: Two times a day (BID) | ORAL | Status: DC
  Administered 2012-06-07 – 2012-06-08 (×2): 600 mg via ORAL
  Filled 2012-06-07 (×4): qty 1

## 2012-06-07 NOTE — Progress Notes (Deleted)
Physical Therapy Evaluation Note   06/07/12 1000  PT Visit Information  Last PT Received On 06/07/12  Assistance Needed +2  PT Time Calculation  PT Start Time 1018  PT Stop Time 1058  PT Time Calculation (min) 40 min  Subjective Data  Subjective I feel alright  Patient Stated Goal To go home  Precautions  Precautions None  Home Living  Lives With Alone  Available Help at Discharge Skilled Nursing Facility  Type of Home Independent living facility  Home Access Level entry  Home Layout One level  Home Adaptive Equipment Dan Humphreys - four wheeled;Shower chair with back  Prior Function  Level of Independence Independent with assistive device(s)  Able to Take Stairs? No  Vocation Retired  Advertising account planner  Overall Cognitive Status Appears within functional limits for tasks assessed/performed  Arousal/Alertness Awake/alert  Orientation Level Appears intact for tasks assessed  Behavior During Session [facility] for tasks performed  Bed Mobility  Bed Mobility Supine to Sit;Sitting - Scoot to Edge of Bed  Supine to Sit 3: Mod assist  Sitting - Scoot to Edge of Bed 3: Mod assist  Details for Bed Mobility Assistance (A) with balance, initiating mvt, safety, and proper technique.  Cues for hand placement and proper technique  Transfers  Transfers Sit to Stand;Stand to Sit;Stand Pivot Transfers  Sit to Stand 1: +2 Total assist;With upper extremity assist;From bed  Sit to Stand: Patient Percentage 60%  Stand to Sit 3: Mod assist;To chair/3-in-1  Stand Pivot Transfers 1: +2 Total assist  Stand Pivot Transfers: Patient Percentage 40%  Details for Transfer Assistance (A) with RW placement, balance, safety, and proper technique.  (A) with initiating mvt and slowing descent to chair. Cues for hand placement, RW placement, and posture  Ambulation/Gait  Ambulation/Gait Assistance Not tested (comment)  Wheelchair Mobility  Wheelchair Mobility No  Exercises  Exercises  Total Joint  Total Joint Exercises  Ankle Circles/Pumps AROM;Both;10 reps;Seated  Quad Sets AROM;Both;5 reps  Gluteal Sets 5 reps;Seated  Heel Slides AROM;Both;5 reps;Seated  PT - End of Session  Equipment Utilized During Treatment Gait belt  Activity Tolerance Patient tolerated treatment well  Patient left in chair;with call bell/phone within reach  Nurse Communication Mobility status  PT Assessment  Clinical Impression Statement Pt is a 78 y.o. F admitted with foot ulcer.  Pt fatigued and weak.  Pt will benefit from acute physical therapy services to increase endurance, stength, balance, and safety awareness. Pt will benefit from inpatient rehab but will need snf if pt does not meet criteria for inpatient rehab.  PT Recommendation/Assessment Patient needs continued PT services  PT Problem List Decreased strength;Decreased activity tolerance;Decreased balance;Decreased mobility;Decreased safety awareness;Decreased knowledge of use of DME;Pain  Barriers to Discharge None  PT Therapy Diagnosis  Difficulty walking;Abnormality of gait;Generalized weakness;Acute pain  PT Plan  PT Frequency Min 3X/week  PT Treatment/Interventions DME instruction;Gait training;Stair training;Functional mobility training;Therapeutic activities;Therapeutic exercise;Neuromuscular re-education;Patient/family education;Balance training  PT Recommendation  Recommendations for Other Services Rehab consult  Follow Up Recommendations Post acute inpatient rehab  Does the patient have the potential to tolerate intense rehabilitation? Yes, Recommend IP Rehab Screening  Equipment Recommended None recommended by PT  Individuals Consulted  Consulted and Agree with Results and Recommendations Patient  Acute Rehab PT Goals  PT Goal Formulation With patient  Time For Goal Achievement 06/14/12  Potential to Achieve Goals Good  Pt will go Supine/Side to Sit with modified independence;with HOB 0 degrees  PT Goal: Supine/Side to  Sit - Progress Goal set today  Pt will go Sit to Supine/Side with modified independence;with HOB 0 degrees  PT Goal: Sit to Supine/Side - Progress Goal set today  Pt will go Sit to Stand with supervision;with upper extremity assist  PT Goal: Sit to Stand - Progress Goal set today  Pt will go Stand to Sit with supervision;with upper extremity assist  PT Goal: Stand to Sit - Progress Goal set today  Pt will Transfer Bed to Chair/Chair to Bed with modified independence  PT Transfer Goal: Bed to Chair/Chair to Bed - Progress Goal set today  Pt will Ambulate 51 - 150 feet;with supervision;with least restrictive assistive device  PT Goal: Ambulate - Progress Goal set today  Pt will Perform Home Exercise Program with supervision, verbal cues required/provided  PT Goal: Perform Home Exercise Program - Progress Goal set today    Pt reports pain in L LE when placed on floor for transfer.  Antonia Jicha, SPT

## 2012-06-07 NOTE — Progress Notes (Signed)
INITIAL ADULT NUTRITION ASSESSMENT Date: 06/07/2012   Time: 3:26 PM Reason for Assessment: MAOI  INTERVENTION: 1.  Modify diet; recommend change diet order to [facility] Mod with Low Tyramine restriction.  Paged MD who authorizes diet change. 2.  Brief education; re:  Low tyramine diet provided to daughter. RD provided "Tyramine-Restricted Nutrition Therapy" handout from the Academy of [employer]etics and discussed importance of this restriction while taking current medication. Reviewed list of foods to avoid. Expect good compliance.   DOCUMENTATION CODES Per approved criteria  -Not Applicable    ASSESSMENT: Female 78 y.o.  Dx: Osteomyelitis  Hx:  Past Medical History  Diagnosis Date  . Hypertension   . Dyslipidemia   . CAD (coronary artery disease)     Non-STEMI August, 2010,,,, 40% left main, 30% LAD, 60% OM 2, 40% RCA, 90% OM1-culprit lesion-too small for intervention           . Ejection fraction     EF 55% ,2011 / EF 30% with tachycardia cardiomyopathy / EF 50% January, 2012  . Diabetes mellitus   . Dementia     ?? Early dementia ?? 2012  . GERD (gastroesophageal reflux disease)   . TIA (transient ischemic attack)     History of TIAs  . Hypothyroidism   . PAD (peripheral artery disease)     Carotid endarterectomy, right. Stents right peroneal, right superficial, right popliteal arteries, Dr. Edilia Bo  . Amputation     Right midfoot 2010  . CKD (chronic kidney disease)     Creatinine 1.19 at December 2012 discharge  . Gait disorder   . Vertebrobasilar insufficiency   . Depression   . Hyperkalemia     ACE Inhibitor held  . Acute renal insufficiency     Catheterization August 2010  . Blindness of right eye   . Respiratory failure     Hypoxic and hypercapnic, 2011, etiology pneumonia  . Orthostatic hypotension     Syncope January 2012 with facial trauma, amiodarone stopped  . Atrial fibrillation 8/10    on pradaxa, s/p AV nodal ablation  . Ventricular  tachycardia     20 beats December, 2012, asymptomatic  . Atrial flutter 05/24/09    Hospitalization in [place]; with difficult rate control; TEE cardioversion done EF 30%, secondary to tachycardia, pt did convert back tosinus rhythm; return 10/11, amiodarone restarted 10/11, no longer on it. Recurrence with RVR 08/2011.  [name] Anemia in CKD (chronic kidney disease)   . Gastroparesis 3/11    Abnormal GES  . Drug therapy,  Sotolol     Sotolol started 08/2011.  . Drug therapy     Pradaxa January, 2013  . Complete heart block     s/p AV nodal ablation  . Chronic diastolic CHF (congestive heart failure)     echo 03/2012- LVEF 55-60%, grade 2 diastolic dysfunction, mild LA dilatation, mild MR, paradoxical vent septal motion  . On home O2     prn   Past Surgical History  Procedure Date  . Total abdominal hysterectomy   . Cholecystectomy   . Carotid endarterectomy   . Amputation 2010    RIGHT MIDFOOT  . Tonsillectomy   . Eye surgeries     Several right  . Pacemaker insertion     SJM Pacemaker implant 9/10  . Pci stent to the right peroneal and right superficial as well as right popliteal arteries     Dr. Edilia Bo  . Av nodal ablation 08/2011    by Dr Ladona Ridgel  .  I&d extremity 05/28/2012    Procedure: IRRIGATION AND DEBRIDEMENT EXTREMITY;  Surgeon: Sherren Kerns, MD;  Location: [facility] OR;  Service: Vascular;  Laterality: Left;  possible amputation toes    Related Meds:  Scheduled Meds:   . docusate sodium  100 mg Oral BID  . feeding supplement  237 mL Oral TID BM  . heparin subcutaneous  5,000 Units Subcutaneous Q8H  . ibuprofen  600 mg Oral Once  . insulin aspart  0-5 Units Subcutaneous QHS  . insulin aspart  0-9 Units Subcutaneous TID WC  . insulin glargine  11 Units Subcutaneous QHS  . levothyroxine  88 mcg Oral QAC breakfast  . linezolid  600 mg Oral Q12H  . oxyCODONE  10 mg Oral Q12H  . potassium chloride  40 mEq Oral BID  . protein supplement  1 scoop Oral TID WC  . senna   2 tablet Oral QHS  . Travoprost (BAK Free)  1 drop Both Eyes QHS  . DISCONTD: heparin  40 Units/kg Dialysis Once in dialysis  . DISCONTD: linezolid  600 mg Intravenous Q12H   Continuous Infusions:  PRN Meds:.sodium chloride, sodium chloride, acetaminophen, feeding supplement (NEPRO CARB STEADY), fentaNYL, HYDROmorphone (DILAUDID) injection, lidocaine, lidocaine-prilocaine, ondansetron (ZOFRAN) IV, oxyCODONE, pentafluoroprop-tetrafluoroeth, sodium chloride, sodium chloride, zolpidem, DISCONTD: heparin, DISCONTD: heparin  Ht: 5\' 7"  (170.2 cm)  Wt: 176 lb 12.9 oz (80.2 kg)  Ideal Wt: 61.3 kg % Ideal Wt: 130%  Usual Wt: 164-171 lbs % Usual Wt: ~102%  Body mass index is 27.69 kg/(m^2).  Food/Nutrition Related Hx: stable intake and wt PTA  Labs:  CMP     Component Value Date/Time   NA 133* 06/07/2012 0352   K 4.3 06/07/2012 0352   CL 88* 06/07/2012 0352   CO2 37* 06/07/2012 0352   GLUCOSE 120* 06/07/2012 0352   BUN 35* 06/07/2012 0352   CREATININE 1.68* 06/07/2012 0352   CALCIUM 9.8 06/07/2012 0352   PROT 7.3 05/15/2012 1520   ALBUMIN 2.0* 06/06/2012 0440   AST 36 05/15/2012 1520   ALT 21 05/15/2012 1520   ALKPHOS 98 05/15/2012 1520   BILITOT 0.6 05/15/2012 1520   GFRNONAA 28* 06/07/2012 0352   GFRAA 33* 06/07/2012 0352    CBC    Component Value Date/Time   WBC 13.6* 06/07/2012 0352   RBC 3.59* 06/07/2012 0352   HGB 10.5* 06/07/2012 0352   HCT 32.4* 06/07/2012 0352   PLT 415* 06/07/2012 0352   MCV 90.3 06/07/2012 0352   MCH 29.2 06/07/2012 0352   MCHC 32.4 06/07/2012 0352   RDW 15.7* 06/07/2012 0352   LYMPHSABS 3.6 05/22/2012 0045   MONOABS 1.2* 05/22/2012 0045   EOSABS 0.3 05/22/2012 0045   BASOSABS 0.0 05/22/2012 0045    Intake: 50-100% Output:   Intake/Output Summary (Last 24 hours) at 06/07/12 1559 Last data filed at 06/07/12 1459  Gross per 24 hour  Intake   1410 ml  Output   3151 ml  Net  -1741 ml   Last BM (9/29)  Diet Order: Carb Control  Supplements/Tube Feeding: Glucerna  TID, Beneprotein 1 scoop TID,   Estimated Nutritional Needs:   Kcal: 1840-2000 Protein: 64-80g Fluid: per MD discretion based on fluid status  Pt admitted with worsening osteomyelitis of the foot.  Pt with poor PO intake for several days during admission and was placed on oral supplements- Glucerna TID, Beneprotein 1 scoop TID for additional 732 kcal, 18g protein. Pt was started on acute HD (9/25), however fluid status has improved  and HD no longer indicated per Renal MD note. Pt with several days of increased confusion which was also likely contributing to poor oral intake.  Her MS was improved today and intake was increased to 50-100%. RD paged MD for direction re: low tyramine diet for pt on MAOI.  MD states he would like diet change and education for pt.  RD discussed needs with daughter.  Daughter states that pt has been on a low tyramine restriction before.  She states pt eats a lot of ripe fruits and vegetables, however does not consume much cheese, alcohol, or processed meats.   RD notes MD dx pt with severe malnutrition. Pt with poor intake for several days.  Wt currently stable.  She has been on supplements daily and PO intake is improving.   Albumin may not return wnl until infection and inflammation improved.   Current plan is for pt to d/c to SNF. Agree with continuing supplements in setting of suboptimal intake with upcoming plans for surgery once renal function stabilizes.    NUTRITION DIAGNOSIS: -Food and nutrition related knowledge deficit (NB-1.1).  Status: Ongoing  RELATED TO: low tyramine diet  AS EVIDENCE BY: recent diet change for pt  MONITORING/EVALUATION(Goals): 1.  Food/Beverage; pt continues consuming 50-100% of meals. 2.  Knowledge; for questions  EDUCATION NEEDS: -Education needs addressed with daughter re: Low Tyramine diet   Loyce Dys, MS RD LDN Clinical Inpatient Dietitian Pager: [phone] Weekend/After hours pager: [phone]

## 2012-06-07 NOTE — Progress Notes (Signed)
I have personally seen and examined this patient and agree with the assessment/plan as outlined above by [place] MD (PGY2 resident). Brittney [name] as renal function has continued to show improvement as she recovers from contrast-induced nephropathy. Allograft function now appears to be close to her baseline and would recommend minimization of hemodynamic fluctuations at this time-may consider postponement of vascular surgery for at least a week or so to allow for sufficient renal recovery. Continue efforts at minimization of contrast for future exposures. Encouraged Brittney [name] to followup with her primary nephrologist in [place]- Dr Brittney [name].  We'll sign off for now, please call if further questions arise.  Brittney [name] K.,MD 06/07/2012 11:15 AM

## 2012-06-07 NOTE — Progress Notes (Signed)
Daily Renal Progress Note  Subjective:   No acute events overnight. We d/c diuretics yesterday. Patient's mental status appears more clear today than previous days.   Objective:   BP 146/52  Pulse 72  Temp 98.6 F (37 C) (Oral)  Resp 18  Ht 5\' 7"  (1.702 m)  Wt 176 lb 12.9 oz (80.2 kg)  BMI 27.69 kg/m2  SpO2 96%  Intake/Output Summary (Last 24 hours) at 06/07/12 0711 Last data filed at 06/07/12 0600  Gross per 24 hour  Intake   1000 ml  Output   3701 ml  Net  -2701 ml   Weight change: 1 lb 5.2 oz (0.6 kg)  Physical Exam: Gen: Sleeping in bed, NAD. Awakes easily. Asks appropriate questions CVS: RRR Resp: CTAB. Unlabored breathing, good effort Abd: Soft, nontender.  Ext: Edematous in dependent areas, improved. Wound vac left foot. Right forefoot surgically absent. PICC RUE  Imaging: No results found. Labs: BMET  Lab 06/07/12 0352 06/06/12 0440 06/05/12 0435 06/04/12 0629 06/03/12 1045 06/02/12 0532 06/01/12 0532 05/31/12 1437  NA 133* 131* 130* 132* 133* 134* 134* --  K 4.3 3.4* 4.0 3.7 4.4 4.9 4.6 --  CL 88* 89* 92* 94* 95* 94* 95* --  CO2 37* 36* 30 31 27 29 31  --  GLUCOSE 120* 215* 215* 258* 246* 219* 128* --  BUN 35* 31* 30* 25* 34* 49* 41* --  CREATININE 1.68* 1.63* 1.87* 2.05* 3.23* 4.72* 4.18* --  ALB -- -- -- -- -- -- -- --  CALCIUM 9.8 9.4 9.2 8.8 9.1 9.2 9.0 --  PHOS -- 3.0 -- -- -- 6.3* 5.2* 4.7*   CBC  Lab 06/07/12 0352 06/06/12 0440 06/05/12 0435 06/04/12 0629  WBC 13.6* 13.8* 14.1* 14.1*  NEUTROABS -- -- -- --  HGB 10.5* 9.7* 9.0* 8.6*  HCT 32.4* 29.9* 27.8* 27.3*  MCV 90.3 89.8 90.0 91.9  PLT 415* 388 406* 369    Medications:       . docusate sodium  100 mg Oral BID  . feeding supplement  237 mL Oral TID BM  . heparin  40 Units/kg Dialysis Once in dialysis  . heparin subcutaneous  5,000 Units Subcutaneous Q8H  . insulin aspart  0-5 Units Subcutaneous QHS  . insulin aspart  0-9 Units Subcutaneous TID WC  . insulin glargine  11 Units  Subcutaneous QHS  . levothyroxine  88 mcg Oral QAC breakfast  . linezolid  600 mg Intravenous Q12H  . oxyCODONE  10 mg Oral Q12H  . potassium chloride  40 mEq Oral BID  . protein supplement  1 scoop Oral TID WC  . senna  2 tablet Oral QHS  . Travoprost (BAK Free)  1 drop Both Eyes QHS  . DISCONTD: furosemide  160 mg Oral TID    Assessment/ Plan:   Pt is a 78 y.o. yo female with a PMHX of HTN, DM, CAD, A.fib, PVD, CKD with baseline Cr 1.0-1.3 who was transferred to [facility] on 05/21/2012 from [facility] for evaluation of worsening diabetic foot wound, now s/p I&D of abscess and amputation of digits 3,4,5.   ARF- Patient with CKD, now with elevated creatinine. Differential includes volume overload, drug toxicity with supertherapeutic vanc level, but most likely due to contrasted study in setting of CKD and DM. Started on HD on 05/23/12 x1 treatment. - Cr stable at 1.68 this morning. UOP 3.7L without diuretics  - Strict I/O's   Anemia- Appears she does not have chronic anemia, but her HgB  was trending down post-operatively. Iron is low at 23 with sat of 11 - S/p feraheme - Monitor HgB daily, now trending up  Foot infection- Possible osteomyelitis based on clinical findings. VSS following. Patient went to OR 9/20 for debridement as well as amputation of digits.  - Continue abx, per primary team  CHF/A.fib- Followed by Cardiology.  - Rate is controlled at this time with pacemaker   PAD- VSS planning for bypass, although large procedure still seems risky in this patient.   DM- Per primary team. A1c 7.6. CBG controlled with SSI at this time   Nutrition- Albumin low at 1.9, and has been low for a while  - Continue nutrition supplementation  - Encourage PO  Dispo- Lasix stopped yesterday, continues to have adequate UOP. Creatinine has stabilized. HgB improving. Mental status improving as well. No further need for acute HD, therefore femoral line d/c yesterday.   [name],  MD 06/07/2012, 7:11 AM

## 2012-06-07 NOTE — Progress Notes (Signed)
[facility]                                                                                Patient Demographics  Brittney [name], is a 78 y.o. female  YNW:[id number]  MVH:[id number]  DOB - 11/12/1931  Admit date - 05/21/2012  Admitting Physician Gery Pray, MD  Outpatient Primary MD for the patient is FANTA,TESFAYE, MD  LOS - 17   No chief complaint on file.       Assessment & Plan    Brittney [name] is a 78 y.o. female who was transferred to [facility] from the [facility] on 05-21-12 due to a worsening diabetic foot wound of the left foot. She was hospitalized the previous week at [facility]. She has a history of PAD, and had a previous right foot trans metatarsal amputation. After arrival here patient had previous vascular studies including CT angiogram, she also had extensive debridement along with removal of infected bone and tissue done by vascular surgery last week this admission, patient developed acute renal insufficiency due to combination of IV dye injury and antibiotics possibly vancomycin, she also developed acute on chronic diastolic CHF, she was seen by cardiology, nephrology, femoral dialysis catheter was placed and she was started on dialysis on 06/02/2012, thereafter renal function has improved dialysis has been stopped and dialysis catheter has been removed on 06/06/2012, plan is to do a femoropopliteal bypass on the left leg once renal function stabilizes for sure and patient is cleared for surgery by nephrology.    Patient has poor baseline function currently she is undergoing two-week trial of dialysis to see if renal function stabilizes. I have discussed her case with nephrology who do not want any surgeries to be scheduled for the next 2 weeks. If patient declines per family we will focus on comfort.   Placement was requested through social work and case management on 06/03/2012 but patient is hard to place due to being on acute  dialysis.   I have discussed her case with infectious disease physician Dr. Ninetta Lights multiple times plan is to give her a Zyvox through IV based on her intraoperative cultures growing MRSA for a total of 6 weeks with start date being September 14 (was covered on Vanco and Cubicin prior to this date).     Acute on chronic CKD stage III   -Likely combination of IV contrast and vancomycin toxicity -Renal ultrasound (9/23) negative for hydronephrosis, consistent with medical renal disease  Dialysis started per renal on 06/02/2012 through femoral dialysis catheter, on 06/06/2012 patient started showing signs of improving renal function good urine output despite being off of Lasix, dialysis was stopped and dialysis catheter was removed on 06/06/2012. We'll continue to monitor renal function.    Diabetic foot infection/abscess/osteomyelitis foot along with severe PAD  -Surgical intraoperative culture showed MRSA  -Plan 42 days of antibiotics unless patient has BKA (abx start September 14)  -MRI not possible due to the patient's pacemaker  -osteomyelitis due to her surgical findings on September 20,pus extended to metatarsals as noted during intra-op findings-->therefore, she clinically has osteomyelitis , discussed with Dr. Ninetta Lights infectious disease on 06/03/2012 patient okay for Zyvox.  I have discussed her case with infectious disease physician Dr. Ninetta Lights multiple times plan is to give her a Zyvox through IV based on her intraoperative cultures growing MRSA for a total of 6 weeks with start date being September 14 (was covered on Vanco and Cubicin prior to this date)      Severe peripheral vascular disease  -Revascularization delayed per Dr. Darrick Penna, appreciate followup , she will be a high-risk candidate for the surgery. Procedure has been held till patient is cleared by nephrology for surgery.      Acute on chronic Diastolic CHF  -Appreciate cardiology assistance  -Continue strict  I/Os  -positive fluid balance for the hospitalization  -Saline lock IV fluids  -Dialysis is to be started on 9-25 for excess fluid removal     Diabetes mellitus type 2  -CBGs have been well controlled  -Monitor CBGs as patient has worsening renal failure may increase half-life of insulin  -Patient continues to have poor by mouth intake  -Hemoglobin A1c 7.6 on September 14    CBG (last 3)   Basename 06/07/12 0805 06/06/12 2217 06/06/12 1721  GLUCAP 116* 221* 267*      Hypothyroidism  Increased Synthroid to 88 mcg daily   Lab Results  Component Value Date   TSH 5.714* 05/27/2012      Atrial fib  -does not appear to be good warfarin candidate due to falls, poor insight, and he has paced rhythm on the admission EKG. -ASA after surgeries , goal rate controlled     Severe protein calorie malnutrition  -Pre-albumin 4.0  -beneprotein has been started  -Glucerna supplement has been started      Anemia of CKD  -Iron saturation 11%  -Iron dosing per nephrology     On-and-off mild delirium with underlying early dementia  Patient and family educated, supportive care.     Code Status: Full for now, DO NOT RESUSCITATE if worsens per daughter.   Family Communication: Discussed poor prognosis with her daughter and patient, daughter which is then non-heroic treatment, wishes that if patient declines focus on comfort, no long-term life support  Disposition Plan: SNF  Longterm Prognosis remains poor discussed with daughter if she declines daughter wishes comfort care   Procedures femoral dialysis catheter placement on 06/02/2012 for starting of dialysis, now removed.   Consults  Vas Surgery, renal   Time Spent in minutes   35   Antibiotics     Anti-infectives     Start     Dose/Rate Route Frequency Ordered Stop   06/07/12 2200   linezolid (ZYVOX) tablet 600 mg        600 mg Oral Every 12 hours 06/07/12 1157     06/03/12 1300   linezolid (ZYVOX) IVPB  600 mg  Status:  Discontinued        600 mg 300 mL/hr over 60 Minutes Intravenous Every 12 hours 06/03/12 1247 06/07/12 1155   06/01/12 1800   DAPTOmycin (CUBICIN) 472 mg in sodium chloride 0.9 % IVPB  Status:  Discontinued     Comments: Indication--MRSA osteomyelitis      6 mg/kg  78.7 kg 218.9 mL/hr over 30 Minutes Intravenous Every 48 hours 06/01/12 1700 06/03/12 1247   05/29/12 1200   piperacillin-tazobactam (ZOSYN) IVPB 2.25 g  Status:  Discontinued        2.25 g 100 mL/hr over 30 Minutes Intravenous 4 times per day 05/29/12 0752 06/01/12 1707   05/27/12 1800   vancomycin (VANCOCIN) IVPB 1000 mg/200  mL premix  Status:  Discontinued        1,000 mg 200 mL/hr over 60 Minutes Intravenous Every 24 hours 05/27/12 0035 05/29/12 0742   05/23/12 2000   piperacillin-tazobactam (ZOSYN) IVPB 3.375 g  Status:  Discontinued        3.375 g 12.5 mL/hr over 240 Minutes Intravenous 3 times per day 05/23/12 1919 05/29/12 0752   05/23/12 0000   vancomycin (VANCOCIN) 750 mg in sodium chloride 0.9 % 150 mL IVPB        750 mg 150 mL/hr over 60 Minutes Intravenous Every 24 hours 05/22/12 1051 05/27/12 0218   05/22/12 0000   vancomycin (VANCOCIN) IVPB 1000 mg/200 mL premix  Status:  Discontinued        1,000 mg 200 mL/hr over 60 Minutes Intravenous Every 24 hours 05/21/12 2354 05/22/12 1051          Scheduled Meds:    . docusate sodium  100 mg Oral BID  . feeding supplement  237 mL Oral TID BM  . heparin  40 Units/kg Dialysis Once in dialysis  . heparin subcutaneous  5,000 Units Subcutaneous Q8H  . ibuprofen  600 mg Oral Once  . insulin aspart  0-5 Units Subcutaneous QHS  . insulin aspart  0-9 Units Subcutaneous TID WC  . insulin glargine  11 Units Subcutaneous QHS  . levothyroxine  88 mcg Oral QAC breakfast  . linezolid  600 mg Oral Q12H  . oxyCODONE  10 mg Oral Q12H  . potassium chloride  40 mEq Oral BID  . protein supplement  1 scoop Oral TID WC  . senna  2 tablet Oral QHS  .  Travoprost (BAK Free)  1 drop Both Eyes QHS  . DISCONTD: linezolid  600 mg Intravenous Q12H   Continuous Infusions:  PRN Meds:.sodium chloride, sodium chloride, acetaminophen, feeding supplement (NEPRO CARB STEADY), fentaNYL, heparin, heparin, HYDROmorphone (DILAUDID) injection, lidocaine, lidocaine-prilocaine, ondansetron (ZOFRAN) IV, oxyCODONE, pentafluoroprop-tetrafluoroeth, sodium chloride, sodium chloride, zolpidem   DVT Prophylaxis  Heparin    Susa Raring K M.D on 06/07/2012 at 12:17 PM  Between 7am to 7pm - Pager - [phone]  After 7pm go to [email or web address] - password [email or web address]  And look for the night coverage person covering for me after hours  [facility] Office  [phone]    Subjective:   Brittney [name] today has, No headache, No chest pain, No abdominal pain - No Nausea, No new weakness tingling or numbness, does have some cough and mild shortness of breath.  Objective:   Filed Vitals:   06/06/12 0647 06/06/12 1413 06/06/12 2218 06/07/12 0626  BP: 148/65 143/55 140/59 146/52  Pulse: 75 70 75 72  Temp: 98.4 F (36.9 C) 98.3 F (36.8 C) 98.2 F (36.8 C) 98.6 F (37 C)  TempSrc:  Oral    Resp: 18 18 18 18   Height:      Weight:    80.2 kg (176 lb 12.9 oz)  SpO2: 98% 94% 95% 96%    Wt Readings from Last 3 Encounters:  06/07/12 80.2 kg (176 lb 12.9 oz)  06/07/12 80.2 kg (176 lb 12.9 oz)  06/07/12 80.2 kg (176 lb 12.9 oz)     Intake/Output Summary (Last 24 hours) at 06/07/12 1217 Last data filed at 06/07/12 1100  Gross per 24 hour  Intake    770 ml  Output   3701 ml  Net  -2931 ml    Exam Awake Alert, Oriented X 2, No new F.N deficits, Normal affect  [place].AT,PERRAL Supple Neck,No JVD, No cervical lymphadenopathy appriciated.  Symmetrical Chest wall movement, Good air movement bilaterally, CTAB RRR,No Gallops,Rubs or new Murmurs, No Parasternal Heave +ve B.Sounds, Abd Soft, Non tender, No organomegaly appriciated, No rebound - guarding or  rigidity. No Cyanosis, Clubbing or edema, No new Rash or bruise, has wound VAC in her left foot, right foot old transmetatarsal amputation.   Data Review   Micro Results No results found for this or any previous visit (from the past 240 hour(s)).  Radiology Reports Dg Chest 2 View  05/15/2012  *RADIOLOGY REPORT*  Clinical Data: Fever.  CHEST - 2 VIEW  Comparison: 01/24/2012.  Findings: The pacer wires are stable.  The cardiac silhouette, mediastinal and hilar contours are mildly prominent but unchanged. There is persistent central vascular congestion and possible mild interstitial edema.  No pleural effusion or focal infiltrate.  The bony thorax is intact.  IMPRESSION: Stable cardiac enlargement and central vascular congestion with possible mild interstitial edema.   Original Report Authenticated By: P. Loralie Champagne, M.D.    Ct Tibia Fibula Left W Contrast  05/27/2012  *RADIOLOGY REPORT*  Clinical Data:  Diabetic with foot pain, erythema and swelling. Possible abscess.  CT OF THE LEFT LOWER LEG WITH CONTRAST  Technique:  Multidetector CT imaging of the left lower leg was performed according to the standard protocol following intravenous contrast administration. Multiplanar CT image reconstructions were also generated.  Contrast:  80 ml Omnipaque-300  Comparison:  Foot radiographs 05/15/2012 and today.  Findings:  Imaging was performed from the proximal left lower leg through the left foot as specifically requested.  There is an ill-defined peripherally enhancing fluid collection within the fourth webspace of the foot.  This contains a small air bubble and measures up to 2.1 cm in greatest dimension. Fluid extends distally into the fourth toe.  There is some additional soft tissue emphysema tracking medially within the plantar forefoot within the plantar aspects of the second, third and fourth toes. No other measurable fluid collections are seen.  There is subcutaneous edema throughout the foot to  extending into the distal lower leg.  Superficial varicosities are noted within the lower leg.  Bone windows demonstrate no cortical destruction or significant arthropathic changes.  IMPRESSION:  1.  There is an ill-defined fluid collection within the fourth webspace with distal extension into the fourth toe and along the plantar aspects of the second and third toes.  This collection demonstrates peripheral enhancement and air bubbles and is consistent with an abscess. 2.  No CT evidence of osteomyelitis. 3.  Nonspecific subcutaneous edema in the distal lower leg and plantar foot.   Original Report Authenticated By: [name], M.D.    US Renal  05/31/2012  *RADIOLOGY REPORT*  Clinical Data: Acute on chronic renal failure  RENAL/URINARY TRACT ULTRASOUND COMPLETE  Comparison:  Abdominal ultrasound dated 11/15/2009  Findings:  Right Kidney:  Measures 10.2 cm.  Echogenic renal parenchyma, likely reflecting medical renal disease.  No mass or hydronephrosis.  Left Kidney:  Measures 9.9 cm.  Echogenic renal parenchyma, likely reflecting medical renal disease.  Bladder:  Decompressed by indwelling Foley catheter.  IMPRESSION: Echogenic renal parenchyma, likely reflecting medical renal disease.  No hydronephrosis.  Bladder decompressed by indwelling Foley catheter.   Original Report Authenticated By: Charline Bills, M.D.    Dg Chest Port 1 View  05/29/2012  *RADIOLOGY REPORT*  Clinical Data: Evaluate for congestive heart failure.  PORTABLE CHEST - 1 VIEW  Comparison: Chest x-ray 05/25/2012.  Findings: Lung volumes are low.  No consolidative airspace disease. Probable trace bilateral pleural effusions.  Pulmonary venous congestion without frank pulmonary edema.  Mild cardiomegaly is unchanged. The patient is rotated to the left on today's exam, resulting in distortion of the mediastinal contours and reduced diagnostic sensitivity and specificity for mediastinal pathology. Atherosclerosis of the thoracic aorta.  There is a right upper extremity PICC with tip terminating in the distal superior vena cava. Left-sided pacemaker device in place with lead tips projecting over the expected location of the right atrium and right ventricular apex.  IMPRESSION: 1.  Mild cardiomegaly with pulmonary venous congestion, but no frank pulmonary edema. 2.  Trace bilateral pleural effusions. 3.  Atherosclerosis. 4.  Support apparatus, as above.   Original Report Authenticated By: Florencia Reasons, M.D.    Dg Chest Port 1 View  05/25/2012  *RADIOLOGY REPORT*  Clinical Data: PICC line placement.  PORTABLE CHEST - 1 VIEW  Comparison: 05/21/2012  Findings: Right-sided PICC line noted with tip projecting over the SVC.  No pneumothorax or complicating feature.  Dual lead pacer noted.  There is mild cardiomegaly along with stable interstitial accentuation.  Peripheral airspace opacity in the right mid lung appears slightly less striking.  Atherosclerotic calcification in the aortic arch noted.  IMPRESSION:  1.  Right PICC line tip:  SVC.  No pneumothorax or complicating feature. 2.  Cardiomegaly with mild interstitial accentuation, suspicious for mild interstitial edema. 3.  Faint airspace disease peripherally in the right mid lung appears improved.   Original Report Authenticated By: [name], M.D.    Dg Foot 2 Views Left  05/27/2012  *RADIOLOGY REPORT*  Clinical Data: Diabetic foot infection  LEFT FOOT - 2 VIEW  Comparison: 05/15/2012  Findings: Scattered soft tissue swelling. Diffuse osseous demineralization. Foci of soft tissue gas identified between the bases of the fourth and fifth toes. Joint spaces preserved. Hallux valgus. No acute fracture, dislocation or bone destruction.  IMPRESSION: Soft tissue gas between the bases of the fourth and fifth toes consistent with soft tissue infection. No definite evidence of osteomyelitis. If further soft tissue assessment needed or persistent clinical concern for occult osteomyelitis  remains, consider MR imaging of the left foot.   Original Report Authenticated By: [name], M.D.    Dg Foot Complete Left  05/15/2012  *RADIOLOGY REPORT*  Clinical Data: Diabetic foot ulcers.  LEFT FOOT - COMPLETE 3+ VIEW  Comparison: None  Findings: The joint spaces are maintained.  No acute bony findings or destructive bony changes.  IMPRESSION: No plain film findings for osteomyelitis.   Original Report Authenticated By: P. Loralie Champagne, M.D.     CBC  Lab 06/07/12 0352 06/06/12 0440 06/05/12 0435 06/04/12 0629 06/03/12 1045  WBC 13.6* 13.8* 14.1* 14.1* 17.3*  HGB 10.5* 9.7* 9.0* 8.6* 8.9*  HCT 32.4* 29.9* 27.8* 27.3* 28.0*  PLT 415* 388 406* 369 384  MCV 90.3 89.8 90.0 91.9 91.2  MCH 29.2 29.1 29.1 29.0 29.0  MCHC 32.4 32.4 32.4 31.5 31.8  RDW 15.7* 15.3 14.9 15.1 15.1  LYMPHSABS -- -- -- -- --  MONOABS -- -- -- -- --  EOSABS -- -- -- -- --  BASOSABS -- -- -- -- --  BANDABS -- -- -- -- --    Chemistries   Lab 06/07/12 0352 06/06/12 0440 06/05/12 0435 06/04/12 0629 06/03/12 1045  NA 133* 131* 130* 132* 133*  K 4.3 3.4* 4.0 3.7 4.4  CL 88* 89* 92* 94* 95*  CO2 37* 36* 30 31 27   GLUCOSE 120* 215* 215* 258* 246*  BUN 35* 31* 30* 25* 34*  CREATININE 1.68* 1.63* 1.87* 2.05* 3.23*  CALCIUM 9.8 9.4 9.2 8.8 9.1  MG -- -- -- -- --  AST -- -- -- -- --  ALT -- -- -- -- --  ALKPHOS -- -- -- -- --  BILITOT -- -- -- -- --   ------------------------------------------------------------------------------------------------------------------ estimated creatinine clearance is 30.1 ml/min (by C-G formula based on Cr of 1.68). ------------------------------------------------------------------------------------------------------------------ No results found for this basename: HGBA1C:2 in the last 72 hours ------------------------------------------------------------------------------------------------------------------ No results found for this basename:  CHOL:2,HDL:2,LDLCALC:2,TRIG:2,CHOLHDL:2,LDLDIRECT:2 in the last 72 hours ------------------------------------------------------------------------------------------------------------------ No results found for this basename: TSH,T4TOTAL,FREET3,T3FREE,THYROIDAB in the last 72 hours ------------------------------------------------------------------------------------------------------------------ No results found for this basename: VITAMINB12:2,FOLATE:2,FERRITIN:2,TIBC:2,IRON:2,RETICCTPCT:2 in the last 72 hours  Coagulation profile  Lab 06/01/12 0532  INR 1.22  PROTIME --    No results found for this basename: DDIMER:2 in the last 72 hours  Cardiac Enzymes No results found for this basename: CK:3,CKMB:3,TROPONINI:3,MYOGLOBIN:3 in the last 168 hours ------------------------------------------------------------------------------------------------------------------ No components found with this basename: POCBNP:3

## 2012-06-07 NOTE — Progress Notes (Signed)
Clinical Social Work  CSW met with patient and dtr. CSW provided dtr and patient with bed offers of [facility] and [address]. Patient declines both of these offers. Dtr prefers [address] of [place] or [place]. Dtr also asked to search at [facility]. CSW sent information to [facility]. CSW spoke with [facility] who reported information will be reviewed and they will call CSW with response. [facility] reports that bed availability might be an issue. CSW called [facility] Bun who stated they are still reviewing information and will contact CSW will an answer. SNFs are requesting PT evaluation. Once evaluation posted in system, CSW will forward information to SNFs for insurance approval. CSW will continue to follow.  [place], [place] [phone]

## 2012-06-07 NOTE — Progress Notes (Signed)
Inpatient Diabetes Program Recommendations  AACE/ADA: New Consensus Statement on Inpatient Glycemic Control (2013)  Target Ranges:  Prepandial:   less than 140 mg/dL      Peak postprandial:   less than 180 mg/dL (1-2 hours)      Critically ill patients:  140 - 180 mg/dL   Reason for Visit: Results for Brittney [name], Brittney [name] (MRN [id number]) as of 06/07/2012 13:38  Ref. Range 06/07/2012 08:05 06/07/2012 12:22  Glucose-Capillary Latest Range: 70-99 mg/dL 086 (H) 578 (H)   Note CBG's continue to be elevated.  Consider restarting patients home dose of Tradgenta 5 mg daily.  This may help with post-prandial CBG's and has low risk of causing hypoglycemia.  Will follow.

## 2012-06-07 NOTE — Progress Notes (Signed)
Physical Therapy Evaluation   06/07/12 1000  PT Visit Information  Last PT Received On 06/07/12  Assistance Needed +2  PT Time Calculation  PT Start Time 1018  PT Stop Time 1058  PT Time Calculation (min) 40 min  Subjective Data  Subjective I feel alright  Patient Stated Goal To go home  Precautions  Precautions None (Per MD note want to minimize gait therefore transfers only)  Precaution Comments orange contact  Restrictions  Weight Bearing Restrictions Yes  LLE Weight Bearing (Needs to be clarify by MD; )  Other Position/Activity Restrictions Pt has pressure wound over 1st metatarsal head...a small wound at base of the 5th metatarsal head...we have instructed her to minimize the weight on her L forefoot and to limit the amount of gait that she does in order to maximize healing  Home Living  Lives With Alone  Available Help at Discharge Skilled Nursing Facility  Type of Home Independent living facility  Home Access Level entry  Home Layout One level  Home Adaptive Equipment Dan Humphreys - four wheeled;Shower chair with back  Prior Function  Level of Independence Independent with assistive device(s)  Able to Take Stairs? No  Vocation Retired  Advertising account planner  Overall Cognitive Status Appears within functional limits for tasks assessed/performed  Arousal/Alertness Awake/alert  Orientation Level Appears intact for tasks assessed  Behavior During Session [facility] for tasks performed  Bed Mobility  Bed Mobility Supine to Sit;Sitting - Scoot to Edge of Bed  Supine to Sit 3: Mod assist  Sitting - Scoot to Edge of Bed 3: Mod assist  Details for Bed Mobility Assistance (A) with balance, initiating mvt, safety, and proper technique.  Cues for hand placement and proper technique  Transfers  Transfers Sit to Stand;Stand to Sit;Stand Pivot Transfers  Sit to Stand 1: +2 Total assist;With upper extremity assist;From bed  Sit to Stand: Patient Percentage 60%  Stand  to Sit 3: Mod assist;To chair/3-in-1  Stand Pivot Transfers 1: +2 Total assist  Stand Pivot Transfers: Patient Percentage 40%  Details for Transfer Assistance (A) with RW placement, balance, safety, and proper technique.  (A) with initiating mvt and slowing descent to chair. Cues for hand placement, RW placement, and posture  Ambulation/Gait  Ambulation/Gait Assistance Not tested (comment)  Wheelchair Mobility  Wheelchair Mobility No  Exercises  Exercises Total Joint  Total Joint Exercises  Ankle Circles/Pumps AROM;Both;10 reps;Seated  Quad Sets AROM;Both;5 reps  Gluteal Sets 5 reps;Seated  Heel Slides AROM;Both;5 reps;Seated  PT - End of Session  Equipment Utilized During Treatment Gait belt  Activity Tolerance Patient tolerated treatment well  Patient left in chair;with call bell/phone within reach  Nurse Communication Mobility status  PT Assessment  Clinical Impression Statement Pt is a 78 y.o. F admitted with foot ulcer.  Pt fatigued and weak.  Pt will benefit from acute physical therapy services to increase endurance, stength, balance, and safety awareness. Pt will benefit from inpatient rehab but will need snf if pt does not meet criteria for inpatient rehab.  PT Recommendation/Assessment Patient needs continued PT services  PT Problem List Decreased strength;Decreased activity tolerance;Decreased balance;Decreased mobility;Decreased safety awareness;Decreased knowledge of use of DME;Pain  Barriers to Discharge None  PT Therapy Diagnosis  Difficulty walking;Abnormality of gait;Generalized weakness;Acute pain  PT Plan  PT Frequency Min 3X/week  PT Treatment/Interventions DME instruction;Gait training;Stair training;Functional mobility training;Therapeutic activities;Therapeutic exercise;Neuromuscular re-education;Patient/family education;Balance training  PT Recommendation  Recommendations for Other Services Rehab consult  Follow Up Recommendations Post acute  inpatient rehab    Does the patient have the potential to tolerate intense rehabilitation? Yes, Recommend IP Rehab Screening  Equipment Recommended None recommended by PT  Individuals Consulted  Consulted and Agree with Results and Recommendations Patient  Acute Rehab PT Goals  PT Goal Formulation With patient  Time For Goal Achievement 06/14/12  Potential to Achieve Goals Good  Pt will go Supine/Side to Sit with modified independence;with HOB 0 degrees  PT Goal: Supine/Side to Sit - Progress Goal set today  Pt will go Sit to Supine/Side with modified independence;with HOB 0 degrees  PT Goal: Sit to Supine/Side - Progress Goal set today  Pt will go Sit to Stand with supervision;with upper extremity assist  PT Goal: Sit to Stand - Progress Goal set today  Pt will go Stand to Sit with supervision;with upper extremity assist  PT Goal: Stand to Sit - Progress Goal set today  Pt will Transfer Bed to Chair/Chair to Bed with modified independence  PT Transfer Goal: Bed to Chair/Chair to Bed - Progress Goal set today  Pt will Ambulate 51 - 150 feet;with supervision;with least restrictive assistive device  PT Goal: Ambulate - Progress Goal set today  Pt will Perform Home Exercise Program with supervision, verbal cues required/provided  PT Goal: Perform Home Exercise Program - Progress Goal set today    Minimal left LE pain; only pain during transfer  [facility], PT DPT [phone]  Amy DiTommaso, SPT

## 2012-06-07 NOTE — Progress Notes (Signed)
Patient ID: Brittney [name], female   DOB: 16-Dec-1931, 78 y.o.   MRN: [id number]   SUBJECTIVE: The cardiology team has been following from a distance while her renal function has been stabilize. The patient is comfortable flat in bed today.   Filed Vitals:   06/06/12 0647 06/06/12 1413 06/06/12 2218 06/07/12 0626  BP: 148/65 143/55 140/59 146/52  Pulse: 75 70 75 72  Temp: 98.4 F (36.9 C) 98.3 F (36.8 C) 98.2 F (36.8 C) 98.6 F (37 C)  TempSrc:  Oral    Resp: 18 18 18 18   Height:      Weight:    176 lb 12.9 oz (80.2 kg)  SpO2: 98% 94% 95% 96%    Intake/Output Summary (Last 24 hours) at 06/07/12 0739 Last data filed at 06/07/12 0600  Gross per 24 hour  Intake   1000 ml  Output   3701 ml  Net  -2701 ml    LABS: Basic Metabolic Panel:  Basename 06/07/12 0352 06/06/12 0440  NA 133* 131*  K 4.3 3.4*  CL 88* 89*  CO2 37* 36*  GLUCOSE 120* 215*  BUN 35* 31*  CREATININE 1.68* 1.63*  CALCIUM 9.8 9.4  MG -- --  PHOS -- 3.0   Liver Function Tests:  Basename 06/06/12 0440  AST --  ALT --  ALKPHOS --  BILITOT --  PROT --  ALBUMIN 2.0*   No results found for this basename: LIPASE:2,AMYLASE:2 in the last 72 hours CBC:  Basename 06/07/12 0352 06/06/12 0440  WBC 13.6* 13.8*  NEUTROABS -- --  HGB 10.5* 9.7*  HCT 32.4* 29.9*  MCV 90.3 89.8  PLT 415* 388   Cardiac Enzymes: No results found for this basename: CKTOTAL:3,CKMB:3,CKMBINDEX:3,TROPONINI:3 in the last 72 hours BNP: No components found with this basename: POCBNP:3 D-Dimer: No results found for this basename: DDIMER:2 in the last 72 hours Hemoglobin A1C: No results found for this basename: HGBA1C in the last 72 hours Fasting Lipid Panel: No results found for this basename: CHOL,HDL,LDLCALC,TRIG,CHOLHDL,LDLDIRECT in the last 72 hours Thyroid Function Tests: No results found for this basename: TSH,T4TOTAL,FREET3,T3FREE,THYROIDAB in the last 72 hours  RADIOLOGY: Dg Chest 2 View  05/15/2012  *RADIOLOGY  REPORT*  Clinical Data: Fever.  CHEST - 2 VIEW  Comparison: 01/24/2012.  Findings: The pacer wires are stable.  The cardiac silhouette, mediastinal and hilar contours are mildly prominent but unchanged. There is persistent central vascular congestion and possible mild interstitial edema.  No pleural effusion or focal infiltrate.  The bony thorax is intact.  IMPRESSION: Stable cardiac enlargement and central vascular congestion with possible mild interstitial edema.   Original Report Authenticated By: P. Loralie Champagne, M.D.    Ct Tibia Fibula Left W Contrast  05/27/2012  *RADIOLOGY REPORT*  Clinical Data:  Diabetic with foot pain, erythema and swelling. Possible abscess.  CT OF THE LEFT LOWER LEG WITH CONTRAST  Technique:  Multidetector CT imaging of the left lower leg was performed according to the standard protocol following intravenous contrast administration. Multiplanar CT image reconstructions were also generated.  Contrast:  80 ml Omnipaque-300  Comparison:  Foot radiographs 05/15/2012 and today.  Findings:  Imaging was performed from the proximal left lower leg through the left foot as specifically requested.  There is an ill-defined peripherally enhancing fluid collection within the fourth webspace of the foot.  This contains a small air bubble and measures up to 2.1 cm in greatest dimension. Fluid extends distally into the fourth toe.  There is some  additional soft tissue emphysema tracking medially within the plantar forefoot within the plantar aspects of the second, third and fourth toes. No other measurable fluid collections are seen.  There is subcutaneous edema throughout the foot to extending into the distal lower leg.  Superficial varicosities are noted within the lower leg.  Bone windows demonstrate no cortical destruction or significant arthropathic changes.  IMPRESSION:  1.  There is an ill-defined fluid collection within the fourth webspace with distal extension into the fourth toe and along  the plantar aspects of the second and third toes.  This collection demonstrates peripheral enhancement and air bubbles and is consistent with an abscess. 2.  No CT evidence of osteomyelitis. 3.  Nonspecific subcutaneous edema in the distal lower leg and plantar foot.   Original Report Authenticated By: [name], M.D.    US Renal  05/31/2012  *RADIOLOGY REPORT*  Clinical Data: Acute on chronic renal failure  RENAL/URINARY TRACT ULTRASOUND COMPLETE  Comparison:  Abdominal ultrasound dated 11/15/2009  Findings:  Right Kidney:  Measures 10.2 cm.  Echogenic renal parenchyma, likely reflecting medical renal disease.  No mass or hydronephrosis.  Left Kidney:  Measures 9.9 cm.  Echogenic renal parenchyma, likely reflecting medical renal disease.  Bladder:  Decompressed by indwelling Foley catheter.  IMPRESSION: Echogenic renal parenchyma, likely reflecting medical renal disease.  No hydronephrosis.  Bladder decompressed by indwelling Foley catheter.   Original Report Authenticated By: Charline Bills, M.D.    Dg Chest Port 1 View  05/29/2012  *RADIOLOGY REPORT*  Clinical Data: Evaluate for congestive heart failure.  PORTABLE CHEST - 1 VIEW  Comparison: Chest x-ray 05/25/2012.  Findings: Lung volumes are low.  No consolidative airspace disease. Probable trace bilateral pleural effusions.  Pulmonary venous congestion without frank pulmonary edema.  Mild cardiomegaly is unchanged. The patient is rotated to the left on today's exam, resulting in distortion of the mediastinal contours and reduced diagnostic sensitivity and specificity for mediastinal pathology. Atherosclerosis of the thoracic aorta. There is a right upper extremity PICC with tip terminating in the distal superior vena cava. Left-sided pacemaker device in place with lead tips projecting over the expected location of the right atrium and right ventricular apex.  IMPRESSION: 1.  Mild cardiomegaly with pulmonary venous congestion, but no frank  pulmonary edema. 2.  Trace bilateral pleural effusions. 3.  Atherosclerosis. 4.  Support apparatus, as above.   Original Report Authenticated By: Florencia Reasons, M.D.    Dg Chest Port 1 View  05/25/2012  *RADIOLOGY REPORT*  Clinical Data: PICC line placement.  PORTABLE CHEST - 1 VIEW  Comparison: 05/21/2012  Findings: Right-sided PICC line noted with tip projecting over the SVC.  No pneumothorax or complicating feature.  Dual lead pacer noted.  There is mild cardiomegaly along with stable interstitial accentuation.  Peripheral airspace opacity in the right mid lung appears slightly less striking.  Atherosclerotic calcification in the aortic arch noted.  IMPRESSION:  1.  Right PICC line tip:  SVC.  No pneumothorax or complicating feature. 2.  Cardiomegaly with mild interstitial accentuation, suspicious for mild interstitial edema. 3.  Faint airspace disease peripherally in the right mid lung appears improved.   Original Report Authenticated By: [name], M.D.    Dg Foot 2 Views Left  05/27/2012  *RADIOLOGY REPORT*  Clinical Data: Diabetic foot infection  LEFT FOOT - 2 VIEW  Comparison: 05/15/2012  Findings: Scattered soft tissue swelling. Diffuse osseous demineralization. Foci of soft tissue gas identified between the bases of the  fourth and fifth toes. Joint spaces preserved. Hallux valgus. No acute fracture, dislocation or bone destruction.  IMPRESSION: Soft tissue gas between the bases of the fourth and fifth toes consistent with soft tissue infection. No definite evidence of osteomyelitis. If further soft tissue assessment needed or persistent clinical concern for occult osteomyelitis remains, consider MR imaging of the left foot.   Original Report Authenticated By: [name], M.D.    Dg Foot Complete Left  05/15/2012  *RADIOLOGY REPORT*  Clinical Data: Diabetic foot ulcers.  LEFT FOOT - COMPLETE 3+ VIEW  Comparison: None  Findings: The joint spaces are maintained.  No acute bony  findings or destructive bony changes.  IMPRESSION: No plain film findings for osteomyelitis.   Original Report Authenticated By: P. Loralie Champagne, M.D.     PHYSICAL EXAM  Patient is comfortable flat in bed. Lung exam is difficult. There are scattered rhonchi. Cardiac exam reveals S1 and S2. There no clicks or significant murmurs. The abdomen is soft. She does not have any significant edema in her right leg.    ASSESSMENT AND PLAN:   CAD (coronary artery disease)    Coronary disease has been stable. She has relatively good left jugular function. If her renal function is stable she is cleared for further surgery if it is still appropriate.   Acute on chronic diastolic CHF (congestive heart failure)   Her heart failure appears to be stabilized at this point. She does have relatively good systolic left ventricular function.    Acute on chronic renal failure   Appreciate the great care by the renal team. Fortunately she is improved. Timing of possible surgery will be up to the renal team and vascular surgery.  Patient has a pacemaker in place. She is pacemaker dependent as she has had an AV node ablation in the past. She's doing well with this.  Willa Rough 06/07/2012 7:39 AM

## 2012-06-07 NOTE — Progress Notes (Signed)
Clinical Social Work  Per Charity fundraiser, patient no longer needs HD. MD reports that patient is ready to dc to SNF. CSW met with patient at bedside. No visitors were present. Patient and CSW discussed SNF. Patient reports that she needs to discuss these matters with dtr. CSW called dtr on phone who reported that she still had not discussed plans with siblings. CSW explained the need to develop a dc plan due to patient being near to dc. Dtr agreeable to search in [facility] for SNF but has not made a decision regarding placement.  CSW updated FL2 and attached recent clinicals for SNF search. PT is needed for insurance approval. CSW called PT who is agreeable to complete evaluation with patient today for insurance request. CSW initiated search and will follow up regarding bed offers.  CSW will continue to follow.  [place], [place] [phone]

## 2012-06-07 NOTE — Consult Note (Signed)
Physical Medicine and Rehabilitation Consult Reason for Consult: Deconditioning Referring Physician:  Dr. Thedore Mins.    HPI: Brittney [name] is a 78 y.o. female with history of CKD, DM type 2,  PAD with diabetic left foot wound admitted via [facility] on 09/13 with worsening foot pain due to osteomyelitis and cellulitis. Started on IV vancomycin/zosyn.  and underwent angiography revealing occlusion of SFA and patient underwent I & D of left foot abscess with transmet amputation of 3, 4, 5th toes by Dr. Darrick Penna on 05/28/12. VAC placed on open foot wound.Bypass LLE recommended but held due to medical issues. Hospital course complicated by fluid overload, acute on chronic renal failure.  Renal consulted and felt that renal failure likely due to contrast stude v/s drug toxicity from supra-therapeutic vancomycin. Patient developed encephalopathy and femoral cath placed for HD. Mentation improved with initiation of HD. Creatinine and urine output improving and femoral cath discontinued yesterday.  Patient has been on bedrest with plans for PT today?  Plans for bypass surgery this week v/s BKA if wound declines. MD requesting CIR.   Review of Systems  HENT: Negative for hearing loss and neck pain.   Eyes: Positive for blurred vision (blind right eye and poor vision left eye).  Respiratory: Positive for shortness of breath (use oxygen prn).   Cardiovascular: Negative for chest pain and palpitations.  Gastrointestinal: Positive for constipation.  Musculoskeletal: Negative for back pain and joint pain.  Neurological: Positive for weakness. Negative for headaches.   Past Medical History  Diagnosis Date  . Hypertension   . Dyslipidemia   . CAD (coronary artery disease)     Non-STEMI August, 2010,,,, 40% left main, 30% LAD, 60% OM 2, 40% RCA, 90% OM1-culprit lesion-too small for intervention           . Ejection fraction     EF 55% ,2011 / EF 30% with tachycardia cardiomyopathy / EF 50% January, 2012  . Diabetes  mellitus   . Dementia     ?? Early dementia ?? 2012  . GERD (gastroesophageal reflux disease)   . TIA (transient ischemic attack)     History of TIAs  . Hypothyroidism   . PAD (peripheral artery disease)     Carotid endarterectomy, right. Stents right peroneal, right superficial, right popliteal arteries, Dr. Edilia Bo  . Amputation     Right midfoot 2010  . CKD (chronic kidney disease)     Creatinine 1.19 at December 2012 discharge  . Gait disorder   . Vertebrobasilar insufficiency   . Depression   . Hyperkalemia     ACE Inhibitor held  . Acute renal insufficiency     Catheterization August 2010  . Blindness of right eye   . Respiratory failure     Hypoxic and hypercapnic, 2011, etiology pneumonia  . Orthostatic hypotension     Syncope January 2012 with facial trauma, amiodarone stopped  . Atrial fibrillation 8/10    on pradaxa, s/p AV nodal ablation  . Ventricular tachycardia     20 beats December, 2012, asymptomatic  . Atrial flutter 05/24/09    Hospitalization in [place]; with difficult rate control; TEE cardioversion done EF 30%, secondary to tachycardia, pt did convert back tosinus rhythm; return 10/11, amiodarone restarted 10/11, no longer on it. Recurrence with RVR 08/2011.  [name] Anemia in CKD (chronic kidney disease)   . Gastroparesis 3/11    Abnormal GES  . Drug therapy,  Sotolol     Sotolol started 08/2011.  . Drug therapy  Pradaxa January, 2013  . Complete heart block     s/p AV nodal ablation  . Chronic diastolic CHF (congestive heart failure)     echo 03/2012- LVEF 55-60%, grade 2 diastolic dysfunction, mild LA dilatation, mild MR, paradoxical vent septal motion  . On home O2     prn   Past Surgical History  Procedure Date  . Total abdominal hysterectomy   . Cholecystectomy   . Carotid endarterectomy   . Amputation 2010    RIGHT MIDFOOT  . Tonsillectomy   . Eye surgeries     Several right  . Pacemaker insertion     SJM Pacemaker implant 9/10  .  Pci stent to the right peroneal and right superficial as well as right popliteal arteries     Dr. Edilia Bo  . Av nodal ablation 08/2011    by Dr Ladona Ridgel  . I&d extremity 05/28/2012    Procedure: IRRIGATION AND DEBRIDEMENT EXTREMITY;  Surgeon: Sherren Kerns, MD;  Location: [facility] OR;  Service: Vascular;  Laterality: Left;  possible amputation toes   Family History  Problem Relation Age of Onset  . Cancer Brother     Colon cancer  . Aortic aneurysm Mother   . Colon polyps Neg Hx   . Liver disease Neg Hx     And no CRC   Social History:  reports that she has never smoked. She has never used smokeless tobacco. She reports that she does not drink alcohol or use illicit drugs.   Allergies  Allergen Reactions  . Amiodarone     Autonomic dysfunction  . Morphine Sulfate     REACTION: jerking  . Oxycodone Hcl Itching and Other (See Comments)    Feels weird   Medications Prior to Admission  Medication Sig Dispense Refill  . acetaminophen (TYLENOL) 500 MG tablet Take 500 mg by mouth every 6 (six) hours as needed. For pain      . Ascorbic Acid (VITAMIN C) 1000 MG tablet Take 1,000 mg by mouth daily.        . furosemide (LASIX) 40 MG tablet Take 1 tablet (40 mg total) by mouth 2 (two) times daily.  60 tablet  3  . insulin aspart (NOVOLOG) 100 UNIT/ML injection Inject 5-9 Units into the skin 5 (five) times daily. Per sliding scale.  90-200: 5 units, 201-250: 6 units, 251-300: 7 units, 301-350: 8 units, 351-400: 9 units      . insulin glargine (LANTUS) 100 UNIT/ML injection Inject 15 Units into the skin at bedtime.        [name] levothyroxine (SYNTHROID, LEVOTHROID) 75 MCG tablet Take 75 mcg by mouth daily.        [name] linagliptin (TRADJENTA) 5 MG TABS tablet Take 5 mg by mouth daily.      [name] lisinopril (PRINIVIL) 10 MG tablet Take 1 tablet (10 mg total) by mouth daily.  30 tablet  3  . nitroGLYCERIN (NITROSTAT) 0.4 MG SL tablet Place 0.4 mg under the tongue every 5 (five) minutes as needed. For chest pain       . sertraline (ZOLOFT) 100 MG tablet Take 100 mg by mouth daily.        [name] sulfamethoxazole-trimethoprim (BACTRIM DS) 800-160 MG per tablet Take 1 tablet by mouth 2 (two) times daily.      . travoprost, benzalkonium, (TRAVATAN) 0.004 % ophthalmic solution Place 1 drop into both eyes at bedtime.          Home:    Functional History:  Functional Status:  Mobility:          ADL:    Cognition: Cognition Orientation Level: Oriented to person;Oriented to place    Blood pressure 146/52, pulse 72, temperature 98.6 F (37 C), temperature source Oral, resp. rate 18, height 5\' 7"  (1.702 m), weight 80.2 kg (176 lb 12.9 oz), SpO2 96.00%. .Physical Exam  Nursing note and vitals reviewed. Constitutional: She is oriented to person, place, and time. She appears well-developed.       Thin elderly female.   HENT:  Head: Normocephalic.  Eyes: Pupils are equal, round, and reactive to light.  Neck: Normal range of motion.  Cardiovascular: Normal rate and regular rhythm.   Pulmonary/Chest: Effort normal and breath sounds normal.  Abdominal: Soft. Bowel sounds are normal.  Musculoskeletal:       Pain with ROM left shoulder.  Left foot with VAC in place and mild erythema. No pain with ROM.  Neurological: She is alert and oriented to person, place, and time.    Results for orders placed during the hospital encounter of 05/21/12 (from the past 24 hour(s))  GLUCOSE, CAPILLARY     Status: Abnormal   Collection Time   06/06/12 12:40 PM      Component Value Range   Glucose-Capillary 275 (*) 70 - 99 mg/dL   Comment 1 Notify RN    GLUCOSE, CAPILLARY     Status: Abnormal   Collection Time   06/06/12  5:21 PM      Component Value Range   Glucose-Capillary 267 (*) 70 - 99 mg/dL   Comment 1 Notify RN    GLUCOSE, CAPILLARY     Status: Abnormal   Collection Time   06/06/12 10:17 PM      Component Value Range   Glucose-Capillary 221 (*) 70 - 99 mg/dL  CBC     Status: Abnormal   Collection Time     06/07/12  3:52 AM      Component Value Range   WBC 13.6 (*) 4.0 - 10.5 K/uL   RBC 3.59 (*) 3.87 - 5.11 MIL/uL   Hemoglobin 10.5 (*) 12.0 - 15.0 g/dL   HCT 16.1 (*) 09.6 - 04.5 %   MCV 90.3  78.0 - 100.0 fL   MCH 29.2  26.0 - 34.0 pg   MCHC 32.4  30.0 - 36.0 g/dL   RDW 40.9 (*) 81.1 - 91.4 %   Platelets 415 (*) 150 - 400 K/uL  BASIC METABOLIC PANEL     Status: Abnormal   Collection Time   06/07/12  3:52 AM      Component Value Range   Sodium 133 (*) 135 - 145 mEq/L   Potassium 4.3  3.5 - 5.1 mEq/L   Chloride 88 (*) 96 - 112 mEq/L   CO2 37 (*) 19 - 32 mEq/L   Glucose, Bld 120 (*) 70 - 99 mg/dL   BUN 35 (*) 6 - 23 mg/dL   Creatinine, Ser 7.82 (*) 0.50 - 1.10 mg/dL   Calcium 9.8  8.4 - 95.6 mg/dL   GFR calc non Af Amer 28 (*) >90 mL/min   GFR calc Af Amer 33 (*) >90 mL/min  GLUCOSE, CAPILLARY     Status: Abnormal   Collection Time   06/07/12  8:05 AM      Component Value Range   Glucose-Capillary 116 (*) 70 - 99 mg/dL   No results found.  Assessment/Plan: Diagnosis: PVD with plans for bypass vs amputation this week. Will follow for  plan and need for rehab as the situation dictates.  Thanks  Ivory Broad, MD  06/07/2012

## 2012-06-08 ENCOUNTER — Encounter (HOSPITAL_COMMUNITY): Payer: Self-pay | Admitting: Anesthesiology

## 2012-06-08 LAB — CBC
MCH: 29 pg (ref 26.0–34.0)
MCV: 89.4 fL (ref 78.0–100.0)
Platelets: 451 10*3/uL — ABNORMAL HIGH (ref 150–400)
RBC: 3.59 MIL/uL — ABNORMAL LOW (ref 3.87–5.11)
RDW: 15.8 % — ABNORMAL HIGH (ref 11.5–15.5)

## 2012-06-08 LAB — BASIC METABOLIC PANEL
CO2: 38 mEq/L — ABNORMAL HIGH (ref 19–32)
Calcium: 9.7 mg/dL (ref 8.4–10.5)
Creatinine, Ser: 1.62 mg/dL — ABNORMAL HIGH (ref 0.50–1.10)

## 2012-06-08 LAB — GLUCOSE, CAPILLARY

## 2012-06-08 MED ORDER — METOPROLOL TARTRATE 50 MG PO TABS: 25.0000 mg | ORAL_TABLET | Freq: Two times a day (BID) | ORAL | Status: DC

## 2012-06-08 MED ORDER — BENEPROTEIN PO POWD: 1.0000 | Freq: Three times a day (TID) | ORAL | Status: DC

## 2012-06-08 MED ORDER — SENNA 8.6 MG PO TABS: 2.0000 | ORAL_TABLET | Freq: Every day | ORAL | Status: DC

## 2012-06-08 MED ORDER — ASPIRIN 81 MG PO TBEC: 81.0000 mg | DELAYED_RELEASE_TABLET | Freq: Every day | ORAL | Status: DC

## 2012-06-08 MED ORDER — LINEZOLID 600 MG PO TABS: 600.0000 mg | ORAL_TABLET | Freq: Two times a day (BID) | ORAL | Status: DC

## 2012-06-08 MED ORDER — FERROUS SULFATE 325 (65 FE) MG PO TABS: 325.0000 mg | ORAL_TABLET | Freq: Three times a day (TID) | ORAL | Status: DC

## 2012-06-08 MED ORDER — LEVOTHYROXINE SODIUM 88 MCG PO TABS: 88.0000 ug | ORAL_TABLET | Freq: Every day | ORAL | Status: AC

## 2012-06-08 NOTE — Progress Notes (Signed)
Rn changed patient foley bag over to leg bag so that patient could put on her own pants. Foley bag was packed to take to SNF

## 2012-06-08 NOTE — Progress Notes (Signed)
Clinical Social Work Department CLINICAL SOCIAL WORK PLACEMENT NOTE 06/08/2012  Patient:  Brittney [name], Brittney [name]  Account Number:  [id number] Admit date:  05/21/2012  Clinical Social Worker:  Unk Lightning, LCSW  Date/time:  06/08/2012 04:00 PM  Clinical Social Work is seeking post-discharge placement for this patient at the following level of care:   SKILLED NURSING   (*CSW will update this form in Epic as items are completed)   06/07/2012  Patient/family provided with [facility] Department of Clinical Social Work's list of facilities offering this level of care within the geographic area requested by the patient (or if unable, by the patient's family).  06/07/2012  Patient/family informed of their freedom to choose among providers that offer the needed level of care, that participate in Medicare, Medicaid or managed care program needed by the patient, have an available bed and are willing to accept the patient.  06/07/2012  Patient/family informed of [facility]' ownership interest in [facility], as well as of the fact that they are under no obligation to receive care at this facility.  PASARR submitted to [employer] on 06/03/2012 PASARR number received from [employer] on 06/03/2012  FL2 transmitted to all facilities in geographic area requested by pt/family on  06/07/2012 FL2 transmitted to all facilities within larger geographic area on 06/07/2012  Patient informed that his/her managed care company has contracts with or will negotiate with  certain facilities, including the following:     Patient/family informed of bed offers received:  06/08/2012 Patient chooses bed at [facility] Physician recommends and patient chooses bed at    Patient to be transferred to [facility] on  06/08/2012 Patient to be transferred to facility by [facility]  The following physician request were entered in Epic:   Additional Comments:

## 2012-06-08 NOTE — Discharge Summary (Signed)
[facility]                                                                                   Brittney [name], is a 78 y.o. female  DOB 1932/04/26  MRN [id number].  Admission date:  05/21/2012  Discharge Date:  06/08/2012  Primary MD  [name], MD  Admitting Physician  Gery Pray, MD  Admission Diagnosis  CELLULISTIS extremity pain PVD ischemic foot PVD  Discharge Diagnosis     Principal Problem:  *Osteomyelitis Active Problems:  Hypertension  Dyslipidemia  CAD (coronary artery disease)  Pacemaker  Diabetes mellitus  Dementia  PAD (peripheral artery disease)  Acute renal insufficiency  Cellulitis  Diabetic foot ulcer  Hyponatremia  Diabetic foot infection  Acute on chronic diastolic CHF (congestive heart failure)  Foot abscess  Acute on chronic renal failure      Past Medical History  Diagnosis Date  . Hypertension   . Dyslipidemia   . CAD (coronary artery disease)     Non-STEMI August, 2010,,,, 40% left main, 30% LAD, 60% OM 2, 40% RCA, 90% OM1-culprit lesion-too small for intervention           . Ejection fraction     EF 55% ,2011 / EF 30% with tachycardia cardiomyopathy / EF 50% January, 2012  . Diabetes mellitus   . Dementia     ?? Early dementia ?? 2012  . GERD (gastroesophageal reflux disease)   . TIA (transient ischemic attack)     History of TIAs  . Hypothyroidism   . PAD (peripheral artery disease)     Carotid endarterectomy, right. Stents right peroneal, right superficial, right popliteal arteries, Dr. Edilia Bo  . Amputation     Right midfoot 2010  . CKD (chronic kidney disease)     Creatinine 1.19 at December 2012 discharge  . Gait disorder   . Vertebrobasilar insufficiency   . Depression   . Hyperkalemia     ACE Inhibitor held  . Acute renal insufficiency     Catheterization August 2010  . Blindness of right eye   . Respiratory failure     Hypoxic and hypercapnic, 2011, etiology pneumonia  . Orthostatic  hypotension     Syncope January 2012 with facial trauma, amiodarone stopped  . Atrial fibrillation 8/10    on pradaxa, s/p AV nodal ablation  . Ventricular tachycardia     20 beats December, 2012, asymptomatic  . Atrial flutter 05/24/09    Hospitalization in [place]; with difficult rate control; TEE cardioversion done EF 30%, secondary to tachycardia, pt did convert back tosinus rhythm; return 10/11, amiodarone restarted 10/11, no longer on it. Recurrence with RVR 08/2011.  [name] Anemia in CKD (chronic kidney disease)   . Gastroparesis 3/11    Abnormal GES  . Drug therapy,  Sotolol     Sotolol started 08/2011.  . Drug therapy     Pradaxa January, 2013  . Complete heart block     s/p AV nodal ablation  . Chronic diastolic CHF (congestive heart failure)     echo 03/2012- LVEF 55-60%, grade 2 diastolic dysfunction, mild LA dilatation, mild MR, paradoxical vent septal motion  . On  home O2     prn    Past Surgical History  Procedure Date  . Total abdominal hysterectomy   . Cholecystectomy   . Carotid endarterectomy   . Amputation 2010    RIGHT MIDFOOT  . Tonsillectomy   . Eye surgeries     Several right  . Pacemaker insertion     SJM Pacemaker implant 9/10  . Pci stent to the right peroneal and right superficial as well as right popliteal arteries     Dr. Edilia Bo  . Av nodal ablation 08/2011    by Dr Ladona Ridgel  . I&d extremity 05/28/2012    Procedure: IRRIGATION AND DEBRIDEMENT EXTREMITY;  Surgeon: Sherren Kerns, MD;  [facility] OR;  Service: Vascular;  Laterality: Left;  possible amputation toes        Discharge Diagnoses:   Principal Problem:  *Osteomyelitis Active Problems:  Hypertension  Dyslipidemia  CAD (coronary artery disease)  Pacemaker  Diabetes mellitus  Dementia  PAD (peripheral artery disease)  Acute renal insufficiency  Cellulitis  Diabetic foot ulcer  Hyponatremia  Diabetic foot infection  Acute on chronic diastolic CHF (congestive heart  failure)  Foot abscess  Acute on chronic renal failure    Discharge Condition: Stable   Diet recommendation: See Discharge Instructions below   Consults vascular surgery, her renal, ID over the phone, wound care team   History of present illness and  Hospital Course:  See H&P, Labs, Consult and Test reports for all details in brief,  Brittney [name] is a 78 y.o. female who was transferred to [facility] from the [facility] on 05-21-12 due to a worsening diabetic foot wound of the left foot. She was hospitalized the previous week at [facility]. She has a history of PAD, and had a previous right foot trans metatarsal amputation. After arrival here patient had previous vascular studies including CT angiogram, she also had extensive debridement along with removal of infected bone and tissue done by vascular surgery last week this admission, patient developed acute renal insufficiency due to combination of IV dye injury and antibiotics possibly vancomycin, she also developed acute on chronic diastolic CHF, she was seen by cardiology, nephrology, femoral dialysis catheter was placed and she was started on dialysis on 06/02/2012, thereafter renal function has improved dialysis has been stopped and dialysis catheter has been removed on 06/06/2012, plan is to do a femoropopliteal bypass on the left leg once renal function stabilizes, per nephrologist who I discussed the case with on 06/08/2012, 2 weeks from today would be a good time for surgery provided renal function remains stable.       Acute on chronic CKD stage III  -Likely combination of IV contrast and vancomycin toxicity  -Renal ultrasound (9/23) negative for hydronephrosis, consistent with medical renal disease  Dialysis started per renal on 06/02/2012 through femoral dialysis catheter, on 06/06/2012 patient started showing signs of improving renal function good urine output despite being off of Lasix, dialysis was stopped and dialysis  catheter was removed on 06/06/2012. We'll continue to monitor renal function.     Diabetic foot infection/abscess/osteomyelitis foot along with severe PAD  -Surgical intraoperative culture showed MRSA  -Plan 42 days of antibiotics unless patient has BKA (abx start September 14)  -MRI not possible due to the patient's pacemaker  -osteomyelitis due to her surgical findings on September 20,pus extended to metatarsals as noted during intra-op findings-->therefore, she clinically has osteomyelitis , discussed with Dr. Ninetta Lights infectious disease on 06/03/2012 patient  okay for Zyvox.     I have discussed her case with infectious disease physician Dr. Ninetta Lights multiple times plan is to give her a Zyvox  based on her intraoperative cultures growing MRSA for a total of 6 weeks with start date being September 14 (was covered on Vanco and Cubicin prior to this date)     Severe peripheral vascular disease  -Revascularization delayed per Dr. Darrick Penna, appreciate followup , she will be a high-risk candidate for the surgery. Procedure has been held , per nephrology bypass surgery can be attempted 2 weeks from 06/08/2012 provided renal function remains stable. Again patient will remain high risk for adverse cardiopulmonary outcome for that surgery, please consult cardiology prior to surgery for any further input.     Acute on chronic Diastolic CHF  -Appreciate cardiology assistance  -Continue strict I/Os  -Continue followup with cardiology upon discharge    Diabetes mellitus type 2  -CBGs have been well controlled , continue home regimen -Hemoglobin A1c 7.6 on September 14      CBG (last 3)   CBG (last 3)   Basename 06/08/12 1136 06/08/12 0734 06/07/12 2149  GLUCAP 295* 103* 226*       Hypothyroidism  Increased Synthroid to 88 mcg daily   Lab Results   Component  Value  Date    TSH  5.714*  05/27/2012      Atrial fib  -does not appear to be good warfarin candidate due to falls,  poor insight, and he has paced rhythm on the admission EKG.  -ASA low dose till definitive surgery is done, low-dose beta blocker. Please have her follow with a cardiologist post discharge in 1 to 2 weeks.    Severe protein calorie malnutrition  -Pre-albumin 4.0  -beneprotein to be continued monitor clinically    Anemia of CKD  -Iron saturation 11%  -East on oral iron supplementation continue monitor Iron level sent hemoglobin hematocrit every few weeks.     Today   Subjective:   Aury Raftery today has no headache,no chest abdominal pain,no new weakness tingling or numbness, feels much better wants to go home today.    Objective:   Blood pressure 131/52, pulse 69, temperature 97.6 F (36.4 C), temperature source Oral, resp. rate 16, height 5\' 7"  (1.702 m), weight 76.2 kg (167 lb 15.9 oz), SpO2 95.00%.   Intake/Output Summary (Last 24 hours) at 06/08/12 1548 Last data filed at 06/08/12 1454  Gross per 24 hour  Intake   1760 ml  Output   3240 ml  Net  -1480 ml    Exam Awake Alert, Oriented *3, No new F.N deficits, Normal affect [place].AT,PERRAL Supple Neck,No JVD, No cervical lymphadenopathy appriciated.  Symmetrical Chest wall movement, Good air movement bilaterally, CTAB RRR,No Gallops,Rubs or new Murmurs, No Parasternal Heave +ve B.Sounds, Abd Soft, Non tender, No organomegaly appriciated, No rebound -guarding or rigidity. No Cyanosis, Clubbing or edema, No new Rash or bruise. All toes on the right foot have been admitted in the past, left foot partial amputation of the toes with a wound VAC in place.  Data Review   Major procedures and Radiology Reports - PLEASE review detailed and final reports for all details in brief -       Dg Chest 2 View  05/15/2012  *RADIOLOGY REPORT*  Clinical Data: Fever.  CHEST - 2 VIEW  Comparison: 01/24/2012.  Findings: The pacer wires are stable.  The cardiac silhouette, mediastinal and hilar contours are mildly prominent but  unchanged.  There is persistent central vascular congestion and possible mild interstitial edema.  No pleural effusion or focal infiltrate.  The bony thorax is intact.  IMPRESSION: Stable cardiac enlargement and central vascular congestion with possible mild interstitial edema.   Original Report Authenticated By: P. Loralie Champagne, M.D.    Ct Tibia Fibula Left W Contrast  05/27/2012  *RADIOLOGY REPORT*  Clinical Data:  Diabetic with foot pain, erythema and swelling. Possible abscess.  CT OF THE LEFT LOWER LEG WITH CONTRAST  Technique:  Multidetector CT imaging of the left lower leg was performed according to the standard protocol following intravenous contrast administration. Multiplanar CT image reconstructions were also generated.  Contrast:  80 ml Omnipaque-300  Comparison:  Foot radiographs 05/15/2012 and today.  Findings:  Imaging was performed from the proximal left lower leg through the left foot as specifically requested.  There is an ill-defined peripherally enhancing fluid collection within the fourth webspace of the foot.  This contains a small air bubble and measures up to 2.1 cm in greatest dimension. Fluid extends distally into the fourth toe.  There is some additional soft tissue emphysema tracking medially within the plantar forefoot within the plantar aspects of the second, third and fourth toes. No other measurable fluid collections are seen.  There is subcutaneous edema throughout the foot to extending into the distal lower leg.  Superficial varicosities are noted within the lower leg.  Bone windows demonstrate no cortical destruction or significant arthropathic changes.  IMPRESSION:  1.  There is an ill-defined fluid collection within the fourth webspace with distal extension into the fourth toe and along the plantar aspects of the second and third toes.  This collection demonstrates peripheral enhancement and air bubbles and is consistent with an abscess. 2.  No CT evidence of osteomyelitis.  3.  Nonspecific subcutaneous edema in the distal lower leg and plantar foot.   Original Report Authenticated By: [name], M.D.    US Renal  05/31/2012  *RADIOLOGY REPORT*  Clinical Data: Acute on chronic renal failure  RENAL/URINARY TRACT ULTRASOUND COMPLETE  Comparison:  Abdominal ultrasound dated 11/15/2009  Findings:  Right Kidney:  Measures 10.2 cm.  Echogenic renal parenchyma, likely reflecting medical renal disease.  No mass or hydronephrosis.  Left Kidney:  Measures 9.9 cm.  Echogenic renal parenchyma, likely reflecting medical renal disease.  Bladder:  Decompressed by indwelling Foley catheter.  IMPRESSION: Echogenic renal parenchyma, likely reflecting medical renal disease.  No hydronephrosis.  Bladder decompressed by indwelling Foley catheter.   Original Report Authenticated By: Charline Bills, M.D.    Dg Chest Port 1 View  05/29/2012  *RADIOLOGY REPORT*  Clinical Data: Evaluate for congestive heart failure.  PORTABLE CHEST - 1 VIEW  Comparison: Chest x-ray 05/25/2012.  Findings: Lung volumes are low.  No consolidative airspace disease. Probable trace bilateral pleural effusions.  Pulmonary venous congestion without frank pulmonary edema.  Mild cardiomegaly is unchanged. The patient is rotated to the left on today's exam, resulting in distortion of the mediastinal contours and reduced diagnostic sensitivity and specificity for mediastinal pathology. Atherosclerosis of the thoracic aorta. There is a right upper extremity PICC with tip terminating in the distal superior vena cava. Left-sided pacemaker device in place with lead tips projecting over the expected location of the right atrium and right ventricular apex.  IMPRESSION: 1.  Mild cardiomegaly with pulmonary venous congestion, but no frank pulmonary edema. 2.  Trace bilateral pleural effusions. 3.  Atherosclerosis. 4.  Support apparatus, as above.   Original Report  Authenticated By: Florencia Reasons, M.D.    Dg Chest Port 1  View  05/25/2012  *RADIOLOGY REPORT*  Clinical Data: PICC line placement.  PORTABLE CHEST - 1 VIEW  Comparison: 05/21/2012  Findings: Right-sided PICC line noted with tip projecting over the SVC.  No pneumothorax or complicating feature.  Dual lead pacer noted.  There is mild cardiomegaly along with stable interstitial accentuation.  Peripheral airspace opacity in the right mid lung appears slightly less striking.  Atherosclerotic calcification in the aortic arch noted.  IMPRESSION:  1.  Right PICC line tip:  SVC.  No pneumothorax or complicating feature. 2.  Cardiomegaly with mild interstitial accentuation, suspicious for mild interstitial edema. 3.  Faint airspace disease peripherally in the right mid lung appears improved.   Original Report Authenticated By: [name], M.D.    Dg Foot 2 Views Left  05/27/2012  *RADIOLOGY REPORT*  Clinical Data: Diabetic foot infection  LEFT FOOT - 2 VIEW  Comparison: 05/15/2012  Findings: Scattered soft tissue swelling. Diffuse osseous demineralization. Foci of soft tissue gas identified between the bases of the fourth and fifth toes. Joint spaces preserved. Hallux valgus. No acute fracture, dislocation or bone destruction.  IMPRESSION: Soft tissue gas between the bases of the fourth and fifth toes consistent with soft tissue infection. No definite evidence of osteomyelitis. If further soft tissue assessment needed or persistent clinical concern for occult osteomyelitis remains, consider MR imaging of the left foot.   Original Report Authenticated By: [name], M.D.    Dg Foot Complete Left  05/15/2012  *RADIOLOGY REPORT*  Clinical Data: Diabetic foot ulcers.  LEFT FOOT - COMPLETE 3+ VIEW  Comparison: None  Findings: The joint spaces are maintained.  No acute bony findings or destructive bony changes.  IMPRESSION: No plain film findings for osteomyelitis.   Original Report Authenticated By: P. Loralie Champagne, M.D.     Micro Results     CBC w Diff: Lab  Results  Component Value Date   WBC 11.1* 06/08/2012   HGB 10.4* 06/08/2012   HCT 32.1* 06/08/2012   PLT 451* 06/08/2012   LYMPHOPCT 25 05/22/2012   MONOPCT 9 05/22/2012   EOSPCT 2 05/22/2012   BASOPCT 0 05/22/2012    CMP: Lab Results  Component Value Date   NA 137 06/08/2012   K 3.9 06/08/2012   CL 90* 06/08/2012   CO2 38* 06/08/2012   BUN 36* 06/08/2012   CREATININE 1.62* 06/08/2012   PROT 7.3 05/15/2012   ALBUMIN 2.0* 06/06/2012   BILITOT 0.6 05/15/2012   ALKPHOS 98 05/15/2012   AST 36 05/15/2012   ALT 21 05/15/2012  .   Discharge Instructions     Follow with Primary MD FANTA,TESFAYE, MD in 7 days   Get CBC, CMP, checked 7 days by Primary MD and again as instructed by your Primary MD. Get a 2 view Chest X ray done next visit.  Get Medicines reviewed and adjusted.  Please request your Prim.MD to go over all Hospital Tests and Procedure/Radiological results at the follow up, please get all Hospital records sent to your Prim MD by signing hospital release before you go home.  Activity: As tolerated with Full fall precautions use walker/cane & assistance as needed   Diet:  Heart Healthy-Low carb,  Fluid restriction 1.8 lit/day, Aspiration precautions.  For Heart failure patients - Check your Weight same time everyday, if you gain over 2 pounds, or you develop in leg swelling, experience more shortness of breath or  chest pain, call your Primary MD immediately. Follow Cardiac Low Salt Diet and 1.8 lit/day fluid restriction.  Disposition SNF  If you experience worsening of your admission symptoms, develop shortness of breath, life threatening emergency, suicidal or homicidal thoughts you must seek medical attention immediately by calling 911 or calling your MD immediately  if symptoms less severe.  You Must read complete instructions/literature along with all the possible adverse reactions/side effects for all the Medicines you take and that have been prescribed to you. Take any new Medicines  after you have completely understood and accpet all the possible adverse reactions/side effects.   Follow-up Information    Follow up with [facility], MD. In 1 week.   Contact information:   [address] [place] [address] [phone]       Follow up with Sherren Kerns, MD. Schedule an appointment as soon as possible for a visit in 1 week.   Contact information:   [address] [place] [address] [phone]       Follow up with Johny Sax, MD. Schedule an appointment as soon as possible for a visit in 1 week.   Contact information:   [address]  [address] [place] [address] [phone]       Follow up with DETERDING,JAMES L, MD. Schedule an appointment as soon as possible for a visit in 1 week.   Contact information:   [address] [facility] [place] [address] [phone]       Follow up with Willa Rough, MD. Schedule an appointment as soon as possible for a visit in 1 week.   Contact information:   [address] [place] [address] [phone]            Discharge Medications     Medication List     As of 06/08/2012  3:48 PM    START taking these medications         aspirin 81 MG EC tablet   Take 1 tablet (81 mg total) by mouth daily. Swallow whole.      ferrous sulfate 325 (65 FE) MG tablet   Take 1 tablet (325 mg total) by mouth 3 (three) times daily with meals.      linezolid 600 MG tablet   Commonly known as: ZYVOX   Take 1 tablet (600 mg total) by mouth every 12 (twelve) hours. For 4 more weeks      metoprolol 50 MG tablet   Commonly known as: LOPRESSOR   Take 0.5 tablets (25 mg total) by mouth 2 (two) times daily.      protein supplement Powd   Take 6 g by mouth 3 (three) times daily with meals.      senna 8.6 MG Tabs   Commonly known as: SENOKOT   Take 2 tablets (17.2 mg total) by mouth at bedtime.      CHANGE how you take these  medications         levothyroxine 88 MCG tablet   Commonly known as: SYNTHROID, LEVOTHROID   Take 1 tablet (88 mcg total) by mouth daily before breakfast.   What changed: - medication strength - dose - how often to take the med      CONTINUE taking these medications         acetaminophen 500 MG tablet   Commonly known as: TYLENOL      insulin aspart 100 UNIT/ML injection   Commonly known  as: novoLOG      insulin glargine 100 UNIT/ML injection   Commonly known as: LANTUS      nitroGLYCERIN 0.4 MG SL tablet   Commonly known as: NITROSTAT      sertraline 100 MG tablet   Commonly known as: ZOLOFT      TRADJENTA 5 MG Tabs tablet   Generic drug: linagliptin      travoprost (benzalkonium) 0.004 % ophthalmic solution   Commonly known as: TRAVATAN      vitamin C 1000 MG tablet      STOP taking these medications         furosemide 40 MG tablet   Commonly known as: LASIX      lisinopril 10 MG tablet   Commonly known as: PRINIVIL,ZESTRIL      sulfamethoxazole-trimethoprim 800-160 MG per tablet   Commonly known as: BACTRIM DS          Where to get your medications    These are the prescriptions that you need to pick up. We sent them to a specific pharmacy, so you will need to go there to get them.   [facility] - [place], [place] - [address]    [address] [place] [address]    Phone: [phone]        aspirin 81 MG EC tablet         Information on where to get these meds is not yet available. Ask your nurse or doctor.         ferrous sulfate 325 (65 FE) MG tablet   levothyroxine 88 MCG tablet   linezolid 600 MG tablet   metoprolol 50 MG tablet   protein supplement Powd   senna 8.6 MG Tabs               Total Time in preparing paper work, data evaluation and todays exam - 35 minutes  Leroy Sea M.D on 06/08/2012 at 3:48 PM  [facility] Office  [phone]

## 2012-06-08 NOTE — Progress Notes (Addendum)
Clinical Social Work  CSW spoke with SNF [facility]) who is agreeable to admission today. CSW faxed dc summary and medications to facility. CSW prepared dc packet. CSW informed patient, dtr and RN of dc and all were agreeable. CSW will coordinate transportation via [facility] after PICC line is dc. CSW scheduled pick up for 1730. CSW is signing off but available if needed.  [place], [place] [phone]

## 2012-06-08 NOTE — Progress Notes (Signed)
Clinical Social Work  CSW spoke with [facility] [place] and [facility] who could not offer beds. Dtr interested in [facility] who is agreeable to accept patient. CSW spoke with dtr regarding bed offers and dtr still interested in facility. Per [facility], they need SNF authorization. CSW called [employer] representative Steward Drone) who agreed to assist with authorization. CSW called SNF and explained that once they completed authorization, then representative could retrieve authorization number. Admissions coordinator agreed to contact CSW once authorization has been submitted. CSW will continue to follow.  [place], [place] [phone]

## 2012-06-08 NOTE — Progress Notes (Signed)
[facility]                                                                                Patient Demographics  Brittney [name], is a 78 y.o. female  ZOX:[id number]  [id number]:[id number]  DOB - [birth year]  Admit date - 05/21/2012  Admitting Physician Gery Pray, MD  Outpatient Primary MD for the patient is FANTA,TESFAYE, MD  LOS - 18   No chief complaint on file.       Assessment & Plan    Brittney [name] is a 79 y.o. female who was transferred to [facility] from the [facility] on 05-21-12 due to a worsening diabetic foot wound of the left foot. She was hospitalized the previous week at [facility]. She has a history of PAD, and had a previous right foot trans metatarsal amputation. After arrival here patient had previous vascular studies including CT angiogram, she also had extensive debridement along with removal of infected bone and tissue done by vascular surgery last week this admission, patient developed acute renal insufficiency due to combination of IV dye injury and antibiotics possibly vancomycin, she also developed acute on chronic diastolic CHF, she was seen by cardiology, nephrology, femoral dialysis catheter was placed and she was started on dialysis on 06/02/2012, thereafter renal function has improved dialysis has been stopped and dialysis catheter has been removed on 06/06/2012, plan is to do a femoropopliteal bypass on the left leg once renal function stabilizes, per nephrologist who I discussed the case with on 06/08/2012, 2 weeks from today would be a good time for surgery provided renal function remains stable.   Patient has poor baseline function currently she is undergoing two-week trial of dialysis to see if renal function stabilizes. I have discussed her case with nephrology who do not want any surgeries to be scheduled for the next 2 weeks. If patient declines per family we will focus on comfort.   Placement was requested through social work  and case management on 06/03/2012 but patient is hard to place due to being on acute dialysis.   I have discussed her case with infectious disease physician Dr. Ninetta Lights multiple times plan is to give her a Zyvox through IV based on her intraoperative cultures growing MRSA for a total of 6 weeks with start date being September 14 (was covered on Vanco and Cubicin prior to this date).     Acute on chronic CKD stage III   -Likely combination of IV contrast and vancomycin toxicity -Renal ultrasound (9/23) negative for hydronephrosis, consistent with medical renal disease  Dialysis started per renal on 06/02/2012 through femoral dialysis catheter, on 06/06/2012 patient started showing signs of improving renal function good urine output despite being off of Lasix, dialysis was stopped and dialysis catheter was removed on 06/06/2012. We'll continue to monitor renal function.    Diabetic foot infection/abscess/osteomyelitis foot along with severe PAD  -Surgical intraoperative culture showed MRSA  -Plan 42 days of antibiotics unless patient has BKA (abx start September 14)  -MRI not possible due to the patient's pacemaker  -osteomyelitis due to her surgical findings on September 20,pus extended to metatarsals as noted during intra-op findings-->therefore, she clinically  has osteomyelitis , discussed with Dr. Ninetta Lights infectious disease on 06/03/2012 patient okay for Zyvox.   I have discussed her case with infectious disease physician Dr. Ninetta Lights multiple times plan is to give her a Zyvox through IV based on her intraoperative cultures growing MRSA for a total of 6 weeks with start date being September 14 (was covered on Vanco and Cubicin prior to this date)      Severe peripheral vascular disease  -Revascularization delayed per Dr. Darrick Penna, appreciate followup , she will be a high-risk candidate for the surgery. Procedure has been held , per nephrology bypass surgery can be attempted 2 weeks from  06/08/2012 provided renal function remains stable. Again patient will remain high risk for adverse cardiopulmonary outcome for that surgery, please consult cardiology prior to surgery for any further input.     Acute on chronic Diastolic CHF  -Appreciate cardiology assistance  -Continue strict I/Os  -positive fluid balance for the hospitalization  -Saline lock IV fluids  -Dialysis is to be started on 9-25 for excess fluid removal     Diabetes mellitus type 2  -CBGs have been well controlled  -Monitor CBGs as patient has worsening renal failure may increase half-life of insulin  -Patient continues to have poor by mouth intake  -Hemoglobin A1c 7.6 on September 14    CBG (last 3)   Basename 06/08/12 0734 06/07/12 2149 06/07/12 1701  GLUCAP 103* 226* 298*      Hypothyroidism  Increased Synthroid to 88 mcg daily   Lab Results  Component Value Date   TSH 5.714* 05/27/2012      Atrial fib  -does not appear to be good warfarin candidate due to falls, poor insight, and he has paced rhythm on the admission EKG. -ASA after surgeries , goal rate controlled     Severe protein calorie malnutrition  -Pre-albumin 4.0  -beneprotein has been started  -Glucerna supplement has been started      Anemia of CKD  -Iron saturation 11%  -Iron dosing per nephrology     On-and-off mild delirium with underlying early dementia  Patient and family educated, supportive care.     Code Status: Full for now, DO NOT RESUSCITATE if worsens per daughter.   Family Communication: Discussed poor prognosis with her daughter and patient, daughter which is then non-heroic treatment, wishes that if patient declines focus on comfort, no long-term life support  Disposition Plan: SNF   Longterm Prognosis remains poor discussed with daughter if she declines daughter wishes comfort care.   Procedures femoral dialysis catheter placement on 06/02/2012 for starting of dialysis, now  removed.   Consults  Vas Surgery, renal   Time Spent in minutes   35   Antibiotics     Anti-infectives     Start     Dose/Rate Route Frequency Ordered Stop   06/07/12 2200   linezolid (ZYVOX) tablet 600 mg        600 mg Oral Every 12 hours 06/07/12 1157     06/03/12 1300   linezolid (ZYVOX) IVPB 600 mg  Status:  Discontinued        600 mg 300 mL/hr over 60 Minutes Intravenous Every 12 hours 06/03/12 1247 06/07/12 1155   06/01/12 1800   DAPTOmycin (CUBICIN) 472 mg in sodium chloride 0.9 % IVPB  Status:  Discontinued     Comments: Indication--MRSA osteomyelitis      6 mg/kg  78.7 kg 218.9 mL/hr over 30 Minutes Intravenous Every 48 hours 06/01/12 1700 06/03/12 1247  05/29/12 1200   piperacillin-tazobactam (ZOSYN) IVPB 2.25 g  Status:  Discontinued        2.25 g 100 mL/hr over 30 Minutes Intravenous 4 times per day 05/29/12 0752 06/01/12 1707   05/27/12 1800   vancomycin (VANCOCIN) IVPB 1000 mg/200 mL premix  Status:  Discontinued        1,000 mg 200 mL/hr over 60 Minutes Intravenous Every 24 hours 05/27/12 0035 05/29/12 0742   05/23/12 2000   piperacillin-tazobactam (ZOSYN) IVPB 3.375 g  Status:  Discontinued        3.375 g 12.5 mL/hr over 240 Minutes Intravenous 3 times per day 05/23/12 1919 05/29/12 0752   05/23/12 0000   vancomycin (VANCOCIN) 750 mg in sodium chloride 0.9 % 150 mL IVPB        750 mg 150 mL/hr over 60 Minutes Intravenous Every 24 hours 05/22/12 1051 05/27/12 0218   05/22/12 0000   vancomycin (VANCOCIN) IVPB 1000 mg/200 mL premix  Status:  Discontinued        1,000 mg 200 mL/hr over 60 Minutes Intravenous Every 24 hours 05/21/12 2354 05/22/12 1051          Scheduled Meds:    . docusate sodium  100 mg Oral BID  . feeding supplement  237 mL Oral TID BM  . heparin subcutaneous  5,000 Units Subcutaneous Q8H  . ibuprofen  600 mg Oral Once  . insulin aspart  0-5 Units Subcutaneous QHS  . insulin aspart  0-9 Units Subcutaneous TID WC  . insulin  glargine  11 Units Subcutaneous QHS  . levothyroxine  88 mcg Oral QAC breakfast  . linezolid  600 mg Oral Q12H  . oxyCODONE  10 mg Oral Q12H  . protein supplement  1 scoop Oral TID WC  . senna  2 tablet Oral QHS  . Travoprost (BAK Free)  1 drop Both Eyes QHS  . DISCONTD: heparin  40 Units/kg Dialysis Once in dialysis  . DISCONTD: linezolid  600 mg Intravenous Q12H   Continuous Infusions:  PRN Meds:.sodium chloride, sodium chloride, acetaminophen, feeding supplement (NEPRO CARB STEADY), fentaNYL, HYDROmorphone (DILAUDID) injection, lidocaine, lidocaine-prilocaine, ondansetron (ZOFRAN) IV, oxyCODONE, pentafluoroprop-tetrafluoroeth, sodium chloride, sodium chloride, zolpidem, DISCONTD: heparin, DISCONTD: heparin   DVT Prophylaxis  Heparin    Susa Raring K M.D on 06/08/2012 at 11:24 AM  Between 7am to 7pm - Pager - [phone]  After 7pm go to [email or web address] - password [email or web address]  And look for the night coverage person covering for me after hours  [facility] Office  [phone]    Subjective:   Brittney [name] today has, No headache, No chest pain, No abdominal pain - No Nausea, No new weakness tingling or numbness, does have some cough and mild shortness of breath.  Objective:   Filed Vitals:   06/07/12 1426 06/07/12 1745 06/07/12 2200 06/08/12 0530  BP: 125/64 134/54 144/62 131/52  Pulse: 79 70 70 69  Temp: 98.2 F (36.8 C) 98.7 F (37.1 C) 98.2 F (36.8 C) 97.6 F (36.4 C)  TempSrc: Oral Oral Oral Oral  Resp: 18 18 16 16   Height:      Weight:    76.2 kg (167 lb 15.9 oz)  SpO2: 94% 95% 97% 95%    Wt Readings from Last 3 Encounters:  06/08/12 76.2 kg (167 lb 15.9 oz)  06/08/12 76.2 kg (167 lb 15.9 oz)  06/08/12 76.2 kg (167 lb 15.9 oz)     Intake/Output Summary (Last 24 hours) at 06/08/12 1124 Last data filed  at 06/08/12 0600  Gross per 24 hour  Intake    960 ml  Output   2450 ml  Net  -1490 ml    Exam Awake Alert, Oriented X 2, No new F.N  deficits, Normal affect [place].AT,PERRAL Supple Neck,No JVD, No cervical lymphadenopathy appriciated.  Symmetrical Chest wall movement, Good air movement bilaterally, CTAB RRR,No Gallops,Rubs or new Murmurs, No Parasternal Heave +ve B.Sounds, Abd Soft, Non tender, No organomegaly appriciated, No rebound - guarding or rigidity. No Cyanosis, Clubbing or edema, No new Rash or bruise, has wound VAC in her left foot, right foot old transmetatarsal amputation.   Data Review   Micro Results No results found for this or any previous visit (from the past 240 hour(s)).  Radiology Reports Dg Chest 2 View  05/15/2012  *RADIOLOGY REPORT*  Clinical Data: Fever.  CHEST - 2 VIEW  Comparison: 01/24/2012.  Findings: The pacer wires are stable.  The cardiac silhouette, mediastinal and hilar contours are mildly prominent but unchanged. There is persistent central vascular congestion and possible mild interstitial edema.  No pleural effusion or focal infiltrate.  The bony thorax is intact.  IMPRESSION: Stable cardiac enlargement and central vascular congestion with possible mild interstitial edema.   Original Report Authenticated By: P. Loralie Champagne, M.D.    Ct Tibia Fibula Left W Contrast  05/27/2012  *RADIOLOGY REPORT*  Clinical Data:  Diabetic with foot pain, erythema and swelling. Possible abscess.  CT OF THE LEFT LOWER LEG WITH CONTRAST  Technique:  Multidetector CT imaging of the left lower leg was performed according to the standard protocol following intravenous contrast administration. Multiplanar CT image reconstructions were also generated.  Contrast:  80 ml Omnipaque-300  Comparison:  Foot radiographs 05/15/2012 and today.  Findings:  Imaging was performed from the proximal left lower leg through the left foot as specifically requested.  There is an ill-defined peripherally enhancing fluid collection within the fourth webspace of the foot.  This contains a small air bubble and measures up to 2.1 cm in  greatest dimension. Fluid extends distally into the fourth toe.  There is some additional soft tissue emphysema tracking medially within the plantar forefoot within the plantar aspects of the second, third and fourth toes. No other measurable fluid collections are seen.  There is subcutaneous edema throughout the foot to extending into the distal lower leg.  Superficial varicosities are noted within the lower leg.  Bone windows demonstrate no cortical destruction or significant arthropathic changes.  IMPRESSION:  1.  There is an ill-defined fluid collection within the fourth webspace with distal extension into the fourth toe and along the plantar aspects of the second and third toes.  This collection demonstrates peripheral enhancement and air bubbles and is consistent with an abscess. 2.  No CT evidence of osteomyelitis. 3.  Nonspecific subcutaneous edema in the distal lower leg and plantar foot.   Original Report Authenticated By: [name], M.D.    US Renal  05/31/2012  *RADIOLOGY REPORT*  Clinical Data: Acute on chronic renal failure  RENAL/URINARY TRACT ULTRASOUND COMPLETE  Comparison:  Abdominal ultrasound dated 11/15/2009  Findings:  Right Kidney:  Measures 10.2 cm.  Echogenic renal parenchyma, likely reflecting medical renal disease.  No mass or hydronephrosis.  Left Kidney:  Measures 9.9 cm.  Echogenic renal parenchyma, likely reflecting medical renal disease.  Bladder:  Decompressed by indwelling Foley catheter.  IMPRESSION: Echogenic renal parenchyma, likely reflecting medical renal disease.  No hydronephrosis.  Bladder decompressed by indwelling Foley catheter.  Original Report Authenticated By: Charline Bills, M.D.    Dg Chest Port 1 View  05/29/2012  *RADIOLOGY REPORT*  Clinical Data: Evaluate for congestive heart failure.  PORTABLE CHEST - 1 VIEW  Comparison: Chest x-ray 05/25/2012.  Findings: Lung volumes are low.  No consolidative airspace disease. Probable trace bilateral pleural  effusions.  Pulmonary venous congestion without frank pulmonary edema.  Mild cardiomegaly is unchanged. The patient is rotated to the left on today's exam, resulting in distortion of the mediastinal contours and reduced diagnostic sensitivity and specificity for mediastinal pathology. Atherosclerosis of the thoracic aorta. There is a right upper extremity PICC with tip terminating in the distal superior vena cava. Left-sided pacemaker device in place with lead tips projecting over the expected location of the right atrium and right ventricular apex.  IMPRESSION: 1.  Mild cardiomegaly with pulmonary venous congestion, but no frank pulmonary edema. 2.  Trace bilateral pleural effusions. 3.  Atherosclerosis. 4.  Support apparatus, as above.   Original Report Authenticated By: Florencia Reasons, M.D.    Dg Chest Port 1 View  05/25/2012  *RADIOLOGY REPORT*  Clinical Data: PICC line placement.  PORTABLE CHEST - 1 VIEW  Comparison: 05/21/2012  Findings: Right-sided PICC line noted with tip projecting over the SVC.  No pneumothorax or complicating feature.  Dual lead pacer noted.  There is mild cardiomegaly along with stable interstitial accentuation.  Peripheral airspace opacity in the right mid lung appears slightly less striking.  Atherosclerotic calcification in the aortic arch noted.  IMPRESSION:  1.  Right PICC line tip:  SVC.  No pneumothorax or complicating feature. 2.  Cardiomegaly with mild interstitial accentuation, suspicious for mild interstitial edema. 3.  Faint airspace disease peripherally in the right mid lung appears improved.   Original Report Authenticated By: [name], M.D.    Dg Foot 2 Views Left  05/27/2012  *RADIOLOGY REPORT*  Clinical Data: Diabetic foot infection  LEFT FOOT - 2 VIEW  Comparison: 05/15/2012  Findings: Scattered soft tissue swelling. Diffuse osseous demineralization. Foci of soft tissue gas identified between the bases of the fourth and fifth toes. Joint spaces  preserved. Hallux valgus. No acute fracture, dislocation or bone destruction.  IMPRESSION: Soft tissue gas between the bases of the fourth and fifth toes consistent with soft tissue infection. No definite evidence of osteomyelitis. If further soft tissue assessment needed or persistent clinical concern for occult osteomyelitis remains, consider MR imaging of the left foot.   Original Report Authenticated By: [name], M.D.    Dg Foot Complete Left  05/15/2012  *RADIOLOGY REPORT*  Clinical Data: Diabetic foot ulcers.  LEFT FOOT - COMPLETE 3+ VIEW  Comparison: None  Findings: The joint spaces are maintained.  No acute bony findings or destructive bony changes.  IMPRESSION: No plain film findings for osteomyelitis.   Original Report Authenticated By: P. Loralie Champagne, M.D.     CBC  Lab 06/08/12 0550 06/07/12 0352 06/06/12 0440 06/05/12 0435 06/04/12 0629  WBC 11.1* 13.6* 13.8* 14.1* 14.1*  HGB 10.4* 10.5* 9.7* 9.0* 8.6*  HCT 32.1* 32.4* 29.9* 27.8* 27.3*  PLT 451* 415* 388 406* 369  MCV 89.4 90.3 89.8 90.0 91.9  MCH 29.0 29.2 29.1 29.1 29.0  MCHC 32.4 32.4 32.4 32.4 31.5  RDW 15.8* 15.7* 15.3 14.9 15.1  LYMPHSABS -- -- -- -- --  MONOABS -- -- -- -- --  EOSABS -- -- -- -- --  BASOSABS -- -- -- -- --  BANDABS -- -- -- -- --  Chemistries   Lab 06/08/12 0550 06/07/12 0352 06/06/12 0440 06/05/12 0435 06/04/12 0629  NA 137 133* 131* 130* 132*  K 3.9 4.3 3.4* 4.0 3.7  CL 90* 88* 89* 92* 94*  CO2 38* 37* 36* 30 31  GLUCOSE 73 120* 215* 215* 258*  BUN 36* 35* 31* 30* 25*  CREATININE 1.62* 1.68* 1.63* 1.87* 2.05*  CALCIUM 9.7 9.8 9.4 9.2 8.8  MG -- -- -- -- --  AST -- -- -- -- --  ALT -- -- -- -- --  ALKPHOS -- -- -- -- --  BILITOT -- -- -- -- --   ------------------------------------------------------------------------------------------------------------------ estimated creatinine clearance is 30.5 ml/min (by C-G formula based on Cr of  1.62). ------------------------------------------------------------------------------------------------------------------ No results found for this basename: HGBA1C:2 in the last 72 hours ------------------------------------------------------------------------------------------------------------------ No results found for this basename: CHOL:2,HDL:2,LDLCALC:2,TRIG:2,CHOLHDL:2,LDLDIRECT:2 in the last 72 hours ------------------------------------------------------------------------------------------------------------------ No results found for this basename: TSH,T4TOTAL,FREET3,T3FREE,THYROIDAB in the last 72 hours ------------------------------------------------------------------------------------------------------------------ No results found for this basename: VITAMINB12:2,FOLATE:2,FERRITIN:2,TIBC:2,IRON:2,RETICCTPCT:2 in the last 72 hours  Coagulation profile No results found for this basename: INR:5,PROTIME:5 in the last 168 hours  No results found for this basename: DDIMER:2 in the last 72 hours  Cardiac Enzymes No results found for this basename: CK:3,CKMB:3,TROPONINI:3,MYOGLOBIN:3 in the last 168 hours ------------------------------------------------------------------------------------------------------------------ No components found with this basename: POCBNP:3

## 2012-06-08 NOTE — Progress Notes (Signed)
Pt without complaint  Left foot wound covered with fibrinous exudate, no granulation, no evidence of healing, erythema on dorsal foot essentially resolved  Cr 1.6  Kidney function recovering but fem pop still on hold for another week.  Will arrange follow up as outpt with me or Dr Myra Gianotti.  If the wound continues to deteriorate she will need BKA rather than bypass.  Fabienne Bruns, MD [facility] Office: [phone] Pager: [phone]

## 2012-06-08 NOTE — Progress Notes (Signed)
Saw Dr. Rosalyn Charters note 06/07/12 & note plans for SNF.  Will sign off.  [phone]

## 2012-06-08 NOTE — Progress Notes (Signed)
Physical Therapy Treatment Patient Details Name: ZAIDY ABSHER MRN: [id number] DOB: 1932-04-24 Today's Date: 06/08/2012 Time: 9147-8295 PT Time Calculation (min): 22 min  PT Assessment / Plan / Recommendation Comments on Treatment Session  Admitted with difficult healing diabetic foot ulcer L foot; Progressing with bed mobility and transfers; Likely for transfer to SNF today for continued rehab    Follow Up Recommendations       Barriers to Discharge        Equipment Recommendations  None recommended by PT    Recommendations for Other Services    Frequency Min 3X/week   Plan Discharge plan remains appropriate    Precautions / Restrictions Precautions Precautions: Fall (per MD note, limit amb/ time on feet, so transfers only) Precaution Comments: orange contact Restrictions Other Position/Activity Restrictions: Pt has pressure wound over 1st metatarsal head...a small wound at base of the 5th metatarsal head...we have instructed her to minimize the weight on her L forefoot and to limit the amount of gait that she does in order to maximize healing   Pertinent Vitals/Pain No specific reports of pain    Mobility  Bed Mobility Bed Mobility: Supine to Sit;Sitting - Scoot to Edge of Bed Supine to Sit: 4: Min assist;With rails Sitting - Scoot to [place] of Bed: 4: Min assist Details for Bed Mobility Assistance: (A) with balance, initiating mvt, safety, and proper technique.  Cues for hand placement and proper technique Transfers Transfers: Sit to Stand;Stand to Sit;Stand Pivot Transfers Sit to Stand: 1: +2 Total assist;With upper extremity assist;From bed Sit to Stand: Patient Percentage: 60% Stand to Sit: 3: Mod assist;To chair/3-in-1 Stand Pivot Transfers: 1: +2 Total assist Stand Pivot Transfers: Patient Percentage: 60% Details for Transfer Assistance: Pt opted not to use RW today; was able to reach across recliner to far armrest with LUE; Noted better weight shift towards  recliner; Continued cues for technique and initiation   Increased time with mobility activities for ensuring understanding of cues in pt who is [facility]   Exercises General Exercises - Lower Extremity Straight Leg Raises: AROM;Both;20 reps;Supine (alternating)   PT Diagnosis:    PT Problem List:   PT Treatment Interventions:     PT Goals Acute Rehab PT Goals Time For Goal Achievement: 06/14/12 Potential to Achieve Goals: Good Pt will go Supine/Side to Sit: with modified independence;with HOB 0 degrees PT Goal: Supine/Side to Sit - Progress: Progressing toward goal Pt will go Sit to Supine/Side: with modified independence;with HOB 0 degrees Pt will go Sit to Stand: with supervision;with upper extremity assist PT Goal: Sit to Stand - Progress: Progressing toward goal Pt will go Stand to Sit: with supervision;with upper extremity assist PT Goal: Stand to Sit - Progress: Progressing toward goal Pt will Transfer Bed to Chair/Chair to Bed: with modified independence PT Transfer Goal: Bed to Chair/Chair to Bed - Progress: Progressing toward goal Pt will Perform Home Exercise Program: with supervision, verbal cues required/provided PT Goal: Perform Home Exercise Program - Progress: Progressing toward goal  Visit Information  Last PT Received On: 06/08/12 Assistance Needed: +2    Subjective Data  Subjective: agreeable to OOB   Cognition  Overall Cognitive Status: Appears within functional limits for tasks assessed/performed (for simple tasks) Arousal/Alertness: Awake/alert Orientation Level: Appears intact for tasks assessed Behavior During Session: [facility] for tasks performed    Balance     End of Session PT - End of Session Equipment Utilized During Treatment: Gait belt Activity Tolerance: Patient tolerated treatment well Patient left:  in chair;with call bell/phone within reach Nurse Communication: Mobility status   GP     [facility] [place], [place]  [phone]  06/08/2012, 2:02 PM

## 2012-06-08 NOTE — Progress Notes (Signed)
Patient ID: Brittney [name], female   DOB: [birth year], 78 y.o.   MRN: [id number]   I have been following for the cardiology team from a distance. I wrote a more complete note yesterday. Renal function appears to remain stable today. The patient did diuresis yesterday. After reviewing the record it appears to me that this occurred without a diuretic. She received one dose of ibuprofen yesterday. I would think we would want to try to limit NSAIDS in this setting. I will continue to follow at a distance as needed.  Jerral Bonito, MD

## 2012-06-09 ENCOUNTER — Telehealth: Payer: Self-pay | Admitting: Surgery

## 2012-06-09 ENCOUNTER — Telehealth: Payer: Self-pay | Admitting: Licensed Clinical Social Worker

## 2012-06-09 SURGERY — BYPASS GRAFT FEMORAL-POPLITEAL ARTERY
Anesthesia: General | Site: Leg Upper | Laterality: Right

## 2012-06-09 NOTE — Telephone Encounter (Signed)
Suzette Battiest called from [facility] where the patient is a resident and wanted to know if the patient's medication zyvox could be changed due to cost. I called Suzette Battiest back to get more information because this question was left on the voicemail, but she did not answer. I left her a message to call me back. I will check with the provider after I speak with [place].

## 2012-06-09 NOTE — Telephone Encounter (Addendum)
Message copied by Rosalyn Charters on Wed Jun 09, 2012 11:44 AM ------      Message from: Melene Plan      Created: Wed Jun 09, 2012 10:37 AM                   ----- Message -----         From: Sherren Kerns, MD         Sent: 06/08/2012  10:58 AM           To: Melene Plan, RN            The renal doctors want [place] to delay her bypass for at least another week to allow her kidneys to recover.  She is going to SNF today or tomorrow.  She is originally Dr Myra Gianotti pt so she can see him in a week or if he is out of town she can see me.            Obviously cancel fem pop for tomorrow            Brittney [name]  pt. in [facility] [place], [phone] notified kerry of fu appt. on 06-14-12 4PM

## 2012-06-11 ENCOUNTER — Encounter: Payer: Self-pay | Admitting: Surgery

## 2012-06-14 ENCOUNTER — Ambulatory Visit (INDEPENDENT_AMBULATORY_CARE_PROVIDER_SITE_OTHER): Payer: Medicare Other | Admitting: Surgery

## 2012-06-14 ENCOUNTER — Encounter: Payer: Self-pay | Admitting: Surgery

## 2012-06-14 VITALS — BP 150/62 | HR 70 | Resp 16 | Ht 67.0 in | Wt 158.0 lb

## 2012-06-14 DIAGNOSIS — I739 Peripheral vascular disease, unspecified: Secondary | ICD-10-CM

## 2012-06-14 DIAGNOSIS — N186 End stage renal disease: Secondary | ICD-10-CM

## 2012-06-14 NOTE — Progress Notes (Signed)
Vascular and Vein Specialist of [facility]   Patient name: Brittney [name] MRN: [id number] DOB: 27-Sep-1931 Sex: female     Chief Complaint  Patient presents with  . Follow-up    FU to determine renal function fitness for fem pop  and to assess status of left foot     HISTORY OF PRESENT ILLNESS: The patient is here today for followup of her left foot ulcer. She was admitted to the hospital recently for ischemic changes to her left foot with infection. She was started on antibiotics. She underwent angiography which revealed a left superficial femoral artery occlusion. Because of infection she was taken to the operating room by Dr. fields and underwent toe amputation which was left open. She was scheduled to have a left femoral to above-knee popliteal bypass graft with vein performed however she had worsening renal function and therefore her surgery was delayed by nephrology. She was discharged to nursing facility in comes in today for further evaluation.  Past Medical History  Diagnosis Date  . Hypertension   . Dyslipidemia   . CAD (coronary artery disease)     Non-STEMI August, 2010,,,, 40% left main, 30% LAD, 60% OM 2, 40% RCA, 90% OM1-culprit lesion-too small for intervention           . Ejection fraction     EF 55% ,2011 / EF 30% with tachycardia cardiomyopathy / EF 50% January, 2012  . Diabetes mellitus   . Dementia     ?? Early dementia ?? 2012  . GERD (gastroesophageal reflux disease)   . TIA (transient ischemic attack)     History of TIAs  . Hypothyroidism   . PAD (peripheral artery disease)     Carotid endarterectomy, right. Stents right peroneal, right superficial, right popliteal arteries, Dr. Edilia Bo  . Amputation     Right midfoot 2010  . CKD (chronic kidney disease)     Creatinine 1.19 at December 2012 discharge  . Gait disorder   . Vertebrobasilar insufficiency   . Depression   . Hyperkalemia     ACE Inhibitor held  . Acute renal insufficiency     Catheterization  August 2010  . Blindness of right eye   . Respiratory failure     Hypoxic and hypercapnic, 2011, etiology pneumonia  . Orthostatic hypotension     Syncope January 2012 with facial trauma, amiodarone stopped  . Atrial fibrillation 8/10    on pradaxa, s/p AV nodal ablation  . Ventricular tachycardia     20 beats December, 2012, asymptomatic  . Atrial flutter 05/24/09    Hospitalization in [place]; with difficult rate control; TEE cardioversion done EF 30%, secondary to tachycardia, pt did convert back tosinus rhythm; return 10/11, amiodarone restarted 10/11, no longer on it. Recurrence with RVR 08/2011.  [name] Anemia in CKD (chronic kidney disease)   . Gastroparesis 3/11    Abnormal GES  . Drug therapy,  Sotolol     Sotolol started 08/2011.  . Drug therapy     Pradaxa January, 2013  . Complete heart block     s/p AV nodal ablation  . Chronic diastolic CHF (congestive heart failure)     echo 03/2012- LVEF 55-60%, grade 2 diastolic dysfunction, mild LA dilatation, mild MR, paradoxical vent septal motion  . On home O2     prn    Past Surgical History  Procedure Date  . Total abdominal hysterectomy   . Cholecystectomy   . Carotid endarterectomy   . Amputation 2010  RIGHT MIDFOOT  . Tonsillectomy   . Eye surgeries     Several right  . Pacemaker insertion     SJM Pacemaker implant 9/10  . Pci stent to the right peroneal and right superficial as well as right popliteal arteries     Dr. Edilia Bo  . Av nodal ablation 08/2011    by Dr Ladona Ridgel  . I&d extremity 05/28/2012    Procedure: IRRIGATION AND DEBRIDEMENT EXTREMITY;  Surgeon: Sherren Kerns, MD;  Location: [facility] OR;  Service: Vascular;  Laterality: Left;  possible amputation toes    History   Social History  . Marital Status: Widowed    Spouse Name: N/A    Number of Children: 3  . Years of Education: N/A   Occupational History  . RETIRED    Social History Main Topics  . Smoking status: Never Smoker   . Smokeless  tobacco: Never Used  . Alcohol Use: No  . Drug Use: No  . Sexually Active: No   Other Topics Concern  . Not on file   Social History Narrative   Gets regular exercise.    Family History  Problem Relation Age of Onset  . Cancer Brother     Colon cancer  . Aortic aneurysm Mother   . Colon polyps Neg Hx   . Liver disease Neg Hx     And no CRC    Allergies as of 06/14/2012 - Review Complete 06/14/2012  Allergen Reaction Noted  . Amiodarone  09/25/2011  . Morphine sulfate  04/19/2008  . Oxycodone hcl Itching and Other (See Comments)     Current Outpatient Prescriptions on File Prior to Visit  Medication Sig Dispense Refill  . acetaminophen (TYLENOL) 500 MG tablet Take 500 mg by mouth every 6 (six) hours as needed. For pain      . Ascorbic Acid (VITAMIN C) 1000 MG tablet Take 1,000 mg by mouth 2 (two) times daily.       [name] aspirin 81 MG EC tablet Take 1 tablet (81 mg total) by mouth daily. Swallow whole.  30 tablet  12  . ferrous sulfate 325 (65 FE) MG tablet Take 1 tablet (325 mg total) by mouth 3 (three) times daily with meals.    3  . insulin aspart (NOVOLOG) 100 UNIT/ML injection Inject 5-9 Units into the skin 5 (five) times daily. Per sliding scale.  90-200: 5 units, 201-250: 6 units, 251-300: 7 units, 301-350: 8 units, 351-400: 9 units      . insulin glargine (LANTUS) 100 UNIT/ML injection Inject 11 Units into the skin at bedtime.       [name] levothyroxine (SYNTHROID, LEVOTHROID) 88 MCG tablet Take 1 tablet (88 mcg total) by mouth daily before breakfast.      . linagliptin (TRADJENTA) 5 MG TABS tablet Take 5 mg by mouth daily.      [name] linezolid (ZYVOX) 600 MG tablet Take 1 tablet (600 mg total) by mouth every 12 (twelve) hours. For 4 more weeks      . metoprolol (LOPRESSOR) 50 MG tablet Take 0.5 tablets (25 mg total) by mouth 2 (two) times daily.      . nitroGLYCERIN (NITROSTAT) 0.4 MG SL tablet Place 0.4 mg under the tongue every 5 (five) minutes as needed. For chest pain      .  protein supplement (RESOURCE BENEPROTEIN) POWD Take 6 g by mouth 3 (three) times daily with meals.      . senna (SENOKOT) 8.6 MG TABS Take 2 tablets (  17.2 mg total) by mouth at bedtime.  120 each    . sertraline (ZOLOFT) 100 MG tablet Take 100 mg by mouth daily.        . travoprost, benzalkonium, (TRAVATAN) 0.004 % ophthalmic solution Place 1 drop into both eyes at bedtime.           REVIEW OF SYSTEMS: No changes since her discharge  PHYSICAL EXAMINATION:   Vital signs are BP 150/62  Pulse 70  Resp 16  Ht 5\' 7"  (1.702 m)  Wt 158 lb (71.668 kg)  BMI 24.75 kg/m2 General: The patient appears their stated age. HEENT:  No gross abnormalities Pulmonary:  Non labored breathing Musculoskeletal: There are no major deformities. Neurologic: No focal weakness or paresthesias are detected, Skin: Ischemic changes are noted at the amputation site. The does appear to be decreased erythema and no foul smelling odor or drainage. Psychiatric: The patient has normal affect. Cardiovascular: There is a regular rate and rhythm without significant murmur appreciated. Pedal pulses are not palpable on the left.   Diagnostic Studies None  Assessment: Left foot ulcer Plan: The patient needs to have a left femoral to above-knee popliteal artery bypass graft done for limb salvage. She has had progression of the ischemic changes at the base of her amputation site and I feel that we need to proceed with revascularization in an attempt to salvage her foot. This operation has had to be delayed because of her worsening renal function. I am going to try to get her in to the nephrology clinic within the next week so that she can be reevaluated and hopefully cleared if her renal function is improving. She had a Foley catheter in place which I would like to have removed to decrease the risk of infection. I'm going to schedule her bypass graft once we have received permission from nephrology for either myself or Dr. Darrick Penna  has ever schedule can accommodate her sooner  V. Charlena Cross, M.D. [facility]ists of [place] Office: [phone] Pager:  [phone]

## 2012-06-16 ENCOUNTER — Ambulatory Visit (INDEPENDENT_AMBULATORY_CARE_PROVIDER_SITE_OTHER): Payer: Medicare Other | Admitting: Infectious Diseases

## 2012-06-16 ENCOUNTER — Encounter: Payer: Self-pay | Admitting: Infectious Diseases

## 2012-06-16 ENCOUNTER — Inpatient Hospital Stay: Payer: Medicare Other | Admitting: Infectious Diseases

## 2012-06-16 VITALS — BP 148/81 | HR 69 | Temp 98.1°F | Ht 67.0 in | Wt 165.0 lb

## 2012-06-16 DIAGNOSIS — M869 Osteomyelitis, unspecified: Secondary | ICD-10-CM

## 2012-06-16 MED ORDER — DOXYCYCLINE HYCLATE 100 MG PO TABS: 100.0000 mg | ORAL_TABLET | Freq: Two times a day (BID) | ORAL | Status: DC

## 2012-06-16 NOTE — Progress Notes (Signed)
  Subjective:    Patient ID: Brittney [name], female    DOB: 1931-09-18, 78 y.o.   MRN: [id number]  HPI 78 yo F with DM and previous R foot transmet (~ 2010), was admitted to [facility] 9-7 to 9-12 with L foot cellulitis and ulcers. She was sent home with bactrim, returned to [facility] on 9-13 with worsening cellulitis. Was started on vanco/zosyn. On 9-20 underwent L 3/4/5 transmet amputation. She developed ARF in hospital (secondary to vanco/CT contrast/angiogram contrast). Her Cr peaked at 4.72.  Was eval by WOC in the hospital and noted to have necrotic tissue in the wound bed on the L. Intraoperative Cx grew MRSA (S- clinda, bactrim, tet). By 9-26 was changed to zyvox due to her increased Cr. D/C to SNF on 10-1 with a wound vac. Cr was 1.62.  She is planned for bilateral fem-pop. Today is day 27 of anbx.  Has been feeling ok, having back ache. Has been doing some exercise, "teaching me how walk". No fever or chills. No PIC. No problems with her wound, by her report it is healing well.     Review of Systems  Constitutional: Negative for appetite change and unexpected weight change.  Eyes: Negative for visual disturbance.  Respiratory: Negative for cough and shortness of breath.   Gastrointestinal: Negative for diarrhea and constipation.  Neurological: Positive for numbness.       Objective:   Physical Exam  Constitutional: She appears well-developed and well-nourished.  HENT:  Mouth/Throat: No oropharyngeal exudate.  Eyes: EOM are normal.       Pupils not equal, previous cataract surgery.   Neck: Neck supple.  Cardiovascular: Normal rate, regular rhythm and normal heart sounds.   Pulmonary/Chest: Effort normal and breath sounds normal.  Abdominal: Soft. Bowel sounds are normal. There is no tenderness.  Musculoskeletal: She exhibits no edema.       Feet:  Lymphadenopathy:    She has no cervical adenopathy.          Assessment & Plan:

## 2012-06-16 NOTE — Assessment & Plan Note (Signed)
She has gotten ~4 weeks of zyvox/vanco. Her SNF is having difficulty paying for this to continue. Will change her to doxy- she denies anbx allergies.Stop this after 2 weeks provided her wound is healing well (will defer this 2 week eval to her PCP/wound care team).  Will see her back in 4-6 weeks to recheck wound healing. She is anxious to have her fem-pop.

## 2012-06-17 ENCOUNTER — Ambulatory Visit: Payer: Medicare Other | Admitting: Physician Assistant

## 2012-06-25 ENCOUNTER — Emergency Department (HOSPITAL_COMMUNITY)
Admission: EM | Admit: 2012-06-25 | Discharge: 2012-06-26 | Disposition: A | Discharge status: Home / Self Care | Payer: Medicare Other | Attending: Emergency Medicine | Admitting: Emergency Medicine

## 2012-06-25 ENCOUNTER — Encounter (HOSPITAL_COMMUNITY): Payer: Self-pay | Admitting: Emergency Medicine

## 2012-06-25 ENCOUNTER — Emergency Department (HOSPITAL_COMMUNITY): Payer: Medicare Other

## 2012-06-25 DIAGNOSIS — N12 Tubulo-interstitial nephritis, not specified as acute or chronic: Secondary | ICD-10-CM | POA: Insufficient documentation

## 2012-06-25 DIAGNOSIS — Z79899 Other long term (current) drug therapy: Secondary | ICD-10-CM | POA: Insufficient documentation

## 2012-06-25 DIAGNOSIS — I509 Heart failure, unspecified: Secondary | ICD-10-CM | POA: Insufficient documentation

## 2012-06-25 DIAGNOSIS — I2510 Atherosclerotic heart disease of native coronary artery without angina pectoris: Secondary | ICD-10-CM | POA: Insufficient documentation

## 2012-06-25 DIAGNOSIS — I129 Hypertensive chronic kidney disease with stage 1 through stage 4 chronic kidney disease, or unspecified chronic kidney disease: Secondary | ICD-10-CM | POA: Insufficient documentation

## 2012-06-25 DIAGNOSIS — F0390 Unspecified dementia without behavioral disturbance: Secondary | ICD-10-CM | POA: Insufficient documentation

## 2012-06-25 DIAGNOSIS — Z9089 Acquired absence of other organs: Secondary | ICD-10-CM | POA: Insufficient documentation

## 2012-06-25 DIAGNOSIS — Z794 Long term (current) use of insulin: Secondary | ICD-10-CM | POA: Insufficient documentation

## 2012-06-25 DIAGNOSIS — R1110 Vomiting, unspecified: Secondary | ICD-10-CM

## 2012-06-25 DIAGNOSIS — I5032 Chronic diastolic (congestive) heart failure: Secondary | ICD-10-CM | POA: Insufficient documentation

## 2012-06-25 DIAGNOSIS — R109 Unspecified abdominal pain: Secondary | ICD-10-CM | POA: Insufficient documentation

## 2012-06-25 DIAGNOSIS — E119 Type 2 diabetes mellitus without complications: Secondary | ICD-10-CM | POA: Insufficient documentation

## 2012-06-25 DIAGNOSIS — N189 Chronic kidney disease, unspecified: Secondary | ICD-10-CM | POA: Insufficient documentation

## 2012-06-25 LAB — LIPASE, BLOOD: Lipase: 12 U/L (ref 11–59)

## 2012-06-25 LAB — HEPATIC FUNCTION PANEL
ALT: 24 U/L (ref 0–35)
AST: 42 U/L — ABNORMAL HIGH (ref 0–37)
Albumin: 2.9 g/dL — ABNORMAL LOW (ref 3.5–5.2)
Alkaline Phosphatase: 111 U/L (ref 39–117)
Bilirubin, Direct: 0.2 mg/dL (ref 0.0–0.3)
Total Bilirubin: 0.5 mg/dL (ref 0.3–1.2)

## 2012-06-25 LAB — BASIC METABOLIC PANEL
BUN: 34 mg/dL — ABNORMAL HIGH (ref 6–23)
Calcium: 9.2 mg/dL (ref 8.4–10.5)
Creatinine, Ser: 1.22 mg/dL — ABNORMAL HIGH (ref 0.50–1.10)
GFR calc Af Amer: 48 mL/min — ABNORMAL LOW (ref >90)
GFR calc non Af Amer: 41 mL/min — ABNORMAL LOW (ref >90)
Glucose, Bld: 167 mg/dL — ABNORMAL HIGH (ref 70–99)
Potassium: 5.4 mEq/L — ABNORMAL HIGH (ref 3.5–5.1)

## 2012-06-25 LAB — CBC
HCT: 32.2 % — ABNORMAL LOW (ref 36.0–46.0)
Hemoglobin: 10.4 g/dL — ABNORMAL LOW (ref 12.0–15.0)
MCH: 29.6 pg (ref 26.0–34.0)
MCHC: 32.3 g/dL (ref 30.0–36.0)
MCV: 91.7 fL (ref 78.0–100.0)
RDW: 18.5 % — ABNORMAL HIGH (ref 11.5–15.5)

## 2012-06-25 LAB — POCT I-STAT TROPONIN I

## 2012-06-25 MED ORDER — SODIUM CHLORIDE 0.9 % IV SOLN
INTRAVENOUS | Status: DC
  Administered 2012-06-26: 02:00:00 via INTRAVENOUS

## 2012-06-25 MED ORDER — SODIUM CHLORIDE 0.9 % IV BOLUS (SEPSIS)
500.0000 mL | Freq: Once | INTRAVENOUS | Status: AC
  Administered 2012-06-26: 500 mL via INTRAVENOUS

## 2012-06-25 MED ORDER — ONDANSETRON HCL 4 MG/2ML IJ SOLN
4.0000 mg | Freq: Once | INTRAMUSCULAR | Status: AC
  Administered 2012-06-26: 4 mg via INTRAVENOUS
  Filled 2012-06-25: qty 2

## 2012-06-25 MED ORDER — PANTOPRAZOLE SODIUM 40 MG IV SOLR
40.0000 mg | Freq: Once | INTRAVENOUS | Status: AC
  Administered 2012-06-26: 40 mg via INTRAVENOUS
  Filled 2012-06-25: qty 40

## 2012-06-25 MED ORDER — FENTANYL CITRATE 0.05 MG/ML IJ SOLN
50.0000 ug | Freq: Once | INTRAMUSCULAR | Status: AC
  Administered 2012-06-26: 50 ug via INTRAVENOUS
  Filled 2012-06-25: qty 2

## 2012-06-25 NOTE — ED Notes (Signed)
Patient c/o chest pain that started about 15 min ago.  Daughter states that she was bringing patient initially because of N/V but patient started complaining of chest pain.  Hx of hypertension, PVD, CAD, PAD and dementia. Patient has pacemaker.  Patient is not in any distress at this time.

## 2012-06-25 NOTE — ED Notes (Signed)
Two RN's unsuccessful attempts for IV access. IV team paged

## 2012-06-26 ENCOUNTER — Emergency Department (HOSPITAL_COMMUNITY): Payer: Medicare Other

## 2012-06-26 LAB — URINALYSIS, ROUTINE W REFLEX MICROSCOPIC
Bilirubin Urine: NEGATIVE
Hgb urine dipstick: NEGATIVE
Ketones, ur: NEGATIVE mg/dL
Nitrite: POSITIVE — AB
pH: 5.5 (ref 5.0–8.0)

## 2012-06-26 LAB — URINE MICROSCOPIC-ADD ON

## 2012-06-26 MED ORDER — ONDANSETRON 8 MG PO TBDP: ORAL_TABLET | ORAL | Status: DC

## 2012-06-26 MED ORDER — CEPHALEXIN 500 MG PO CAPS: ORAL_CAPSULE | ORAL | Status: DC

## 2012-06-26 MED ORDER — HYDROCODONE-ACETAMINOPHEN 5-325 MG PO TABS: 1.0000 | ORAL_TABLET | Freq: Four times a day (QID) | ORAL | Status: DC | PRN

## 2012-06-26 MED ORDER — DEXTROSE 5 % IV SOLN
1.0000 g | Freq: Once | INTRAVENOUS | Status: AC
  Administered 2012-06-26: 1 g via INTRAVENOUS
  Filled 2012-06-26: qty 10

## 2012-06-26 NOTE — ED Notes (Signed)
PTAR at bedside 

## 2012-06-26 NOTE — ED Provider Notes (Signed)
History     CSN: [id number]  Arrival date & time 06/25/12  2030   First MD Initiated Contact with Patient 06/25/12 2231      Chief Complaint  Patient presents with  . Chest Pain    (Consider location/radiation/quality/duration/timing/severity/associated sxs/prior treatment) HPI 78 year old female is brought in by her family to the emergency department for the patient having a history of several days of upper abdominal pain with vomiting. The patient is a difficult historian due to her dementia but as best as the family and I can tell the patient has had daily episodes of upper abdominal pain and chest pain lasting several hours at a time associated with nausea and vomiting. The vomiting has been nonbloody. She has been having normal bowel movements. There's been no cough or shortness of breath. There's been no fever or change in her baseline confusion. There's been no trauma no rashes. Her pain has apparently been mild to moderately severe at times. There is no treatment prior to arrival. Past Medical History  Diagnosis Date  . Hypertension   . Dyslipidemia   . CAD (coronary artery disease)     Non-STEMI August, 2010,,,, 40% left main, 30% LAD, 60% OM 2, 40% RCA, 90% OM1-culprit lesion-too small for intervention           . Ejection fraction     EF 55% ,2011 / EF 30% with tachycardia cardiomyopathy / EF 50% January, 2012  . Diabetes mellitus   . Dementia     ?? Early dementia ?? 2012  . GERD (gastroesophageal reflux disease)   . TIA (transient ischemic attack)     History of TIAs  . Hypothyroidism   . PAD (peripheral artery disease)     Carotid endarterectomy, right. Stents right peroneal, right superficial, right popliteal arteries, Dr. Edilia Bo  . Amputation     Right midfoot 2010  . CKD (chronic kidney disease)     Creatinine 1.19 at December 2012 discharge  . Gait disorder   . Vertebrobasilar insufficiency   . Depression   . Hyperkalemia     ACE Inhibitor held  . Acute  renal insufficiency     Catheterization August 2010  . Blindness of right eye   . Respiratory failure     Hypoxic and hypercapnic, 2011, etiology pneumonia  . Orthostatic hypotension     Syncope January 2012 with facial trauma, amiodarone stopped  . Atrial fibrillation 8/10    on pradaxa, s/p AV nodal ablation  . Ventricular tachycardia     20 beats December, 2012, asymptomatic  . Atrial flutter 05/24/09    Hospitalization in [place]; with difficult rate control; TEE cardioversion done EF 30%, secondary to tachycardia, pt did convert back tosinus rhythm; return 10/11, amiodarone restarted 10/11, no longer on it. Recurrence with RVR 08/2011.  [name] Anemia in CKD (chronic kidney disease)   . Gastroparesis 3/11    Abnormal GES  . Drug therapy,  Sotolol     Sotolol started 08/2011.  . Drug therapy     Pradaxa January, 2013  . Complete heart block     s/p AV nodal ablation  . Chronic diastolic CHF (congestive heart failure)     echo 03/2012- LVEF 55-60%, grade 2 diastolic dysfunction, mild LA dilatation, mild MR, paradoxical vent septal motion  . On home O2     prn    Past Surgical History  Procedure Date  . Total abdominal hysterectomy   . Cholecystectomy   . Carotid endarterectomy   .  Amputation 2010    RIGHT MIDFOOT  . Tonsillectomy   . Eye surgeries     Several right  . Pacemaker insertion     SJM Pacemaker implant 9/10  . Pci stent to the right peroneal and right superficial as well as right popliteal arteries     Dr. Edilia Bo  . Av nodal ablation 08/2011    by Dr Ladona Ridgel  . I&d extremity 05/28/2012    Procedure: IRRIGATION AND DEBRIDEMENT EXTREMITY;  Surgeon: Sherren Kerns, MD;  [facility]: [facility];  Service: Vascular;  Laterality: Left;  possible amputation toes    Family History  Problem Relation Age of Onset  . Cancer Brother     Colon cancer  . Aortic aneurysm Mother   . Colon polyps Neg Hx   . Liver disease Neg Hx     And no CRC    History  Substance Use  Topics  . Smoking status: Never Smoker   . Smokeless tobacco: Never Used  . Alcohol Use: No    OB History    Grav Para Term Preterm Abortions TAB SAB Ect Mult Living                  Review of Systems  Unable to perform ROS: Dementia    Allergies  Amiodarone; Morphine sulfate; Oxycodone hcl; and Sulfa antibiotics  Home Medications   Current Outpatient Rx  Name Route Sig Dispense Refill  . ACETAMINOPHEN 500 MG PO TABS Oral Take 1,000 mg by mouth 3 (three) times daily as needed. For pain    . VITAMIN C 1000 MG PO TABS Oral Take 1,000 mg by mouth 2 (two) times daily.     . ASPIRIN 81 MG PO TBEC Oral Take 1 tablet (81 mg total) by mouth daily. Swallow whole. 30 tablet 12  . PRO-STAT 64 PO LIQD Oral Take 30 mLs by mouth 2 (two) times daily.    [name] FERROUS SULFATE 325 (65 FE) MG PO TABS Oral Take 1 tablet (325 mg total) by mouth 3 (three) times daily with meals.  3  . INSULIN ASPART 100 UNIT/ML [place] SOLN Subcutaneous Inject 0-8 Units into the skin 5 (five) times daily. Per sliding scale: 0-150=0 units; 151-200=2 units; 201-250=4 units; 251-300=6 units; 301-350=8 units; >350=CALL MD    . INSULIN GLARGINE 100 UNIT/ML  SOLN Subcutaneous Inject 11 Units into the skin at bedtime.     [name] LEVOTHYROXINE SODIUM 88 MCG PO TABS Oral Take 1 tablet (88 mcg total) by mouth daily before breakfast.    . LINAGLIPTIN 5 MG PO TABS Oral Take 5 mg by mouth daily.    [name] LINEZOLID 600 MG PO TABS Oral Take 600 mg by mouth every 12 (twelve) hours.    [name] METOPROLOL TARTRATE 25 MG PO TABS Oral Take 25 mg by mouth 2 (two) times daily.    . OXYCODONE HCL 5 MG PO TABS Oral Take 5 mg by mouth every 4 (four) hours as needed. For pain    . SENNA 8.6 MG PO TABS Oral Take 2 tablets (17.2 mg total) by mouth at bedtime. 120 each   . SERTRALINE HCL 100 MG PO TABS Oral Take 100 mg by mouth daily.      . TRAVOPROST 0.004 % OP SOLN Both Eyes Place 1 drop into both eyes at bedtime.     . CEPHALEXIN 500 MG PO CAPS  2 caps po bid x  7 days 28 capsule 0  . HYDROCODONE-ACETAMINOPHEN 5-325 MG PO TABS  Oral Take 1 tablet by mouth every 6 (six) hours as needed for pain. 10 tablet 0  . ONDANSETRON 8 MG PO TBDP  8mg  ODT q4 hours prn nausea 4 tablet 0    BP 148/71  Pulse 70  Temp 98.4 F (36.9 C) (Oral)  Resp 16  SpO2 96%  Physical Exam  Nursing note and vitals reviewed. Constitutional:       Awake, alert, nontoxic appearance.  HENT:  Head: Atraumatic.  Eyes: Right eye exhibits no discharge. Left eye exhibits no discharge.  Neck: Neck supple.  Cardiovascular: Normal rate and regular rhythm.   Murmur heard. Pulmonary/Chest: Effort normal and breath sounds normal. No respiratory distress. She has no wheezes. She has no rales. She exhibits no tenderness.  Abdominal: Soft. Bowel sounds are normal. She exhibits no distension and no mass. There is tenderness. There is no rebound and no guarding.       Minimal diffuse upper abdominal tenderness with the lower abdomen nontender and no peritoneal signs present; no CVA tenderness  Musculoskeletal: She exhibits no tenderness.       Baseline ROM, no obvious new focal weakness.  Neurological: She is alert.       Mental status and motor strength appears baseline for patient and situation. Baseline pleasantly confused.  Skin: No rash noted.  Psychiatric: She has a normal mood and affect.    ED Course  Procedures (including critical care time) Pain-free with no nausea and abdomen soft and nontender at recheck.  ECG: Ventricular paced rhythm, ventricular rate 71, underlying atrial flutter, no significant change compared with 05/23/2012 Labs Reviewed  CBC - Abnormal; Notable for the following:    WBC 14.0 (*)     RBC 3.51 (*)     Hemoglobin 10.4 (*)     HCT 32.2 (*)     RDW 18.5 (*)     All other components within normal limits  BASIC METABOLIC PANEL - Abnormal; Notable for the following:    Sodium 131 (*)     Potassium 5.4 (*)     Glucose, Bld 167 (*)     BUN 34 (*)      Creatinine, Ser 1.22 (*)     GFR calc non Af Amer 41 (*)     GFR calc Af Amer 48 (*)     All other components within normal limits  GLUCOSE, CAPILLARY - Abnormal; Notable for the following:    Glucose-Capillary 162 (*)     All other components within normal limits  URINALYSIS, ROUTINE W REFLEX MICROSCOPIC - Abnormal; Notable for the following:    APPearance CLOUDY (*)     Protein, ur 30 (*)     Nitrite POSITIVE (*)     Leukocytes, UA MODERATE (*)     All other components within normal limits  HEPATIC FUNCTION PANEL - Abnormal; Notable for the following:    Albumin 2.9 (*)     AST 42 (*)     All other components within normal limits  URINE MICROSCOPIC-ADD ON - Abnormal; Notable for the following:    Squamous Epithelial / LPF FEW (*)     Bacteria, UA MANY (*)     Casts HYALINE CASTS (*)     All other components within normal limits  POCT I-STAT TROPONIN I  LIPASE, BLOOD  URINE CULTURE   Ct Abdomen Pelvis Wo Contrast  06/26/2012  *RADIOLOGY REPORT*  Clinical Data: Lower abdominal pain  CT ABDOMEN AND PELVIS WITHOUT CONTRAST  Technique:  Multidetector  CT imaging of the abdomen and pelvis was performed following the standard protocol without intravenous contrast.  Comparison: 07/12/2003  Findings: Lung bases are essentially clear.  Cardiomegaly.  Pacemaker leads, incompletely visualized.  Unenhanced liver, spleen, and adrenal glands are unremarkable.  Pancreatic atrophy.  Status post cholecystectomy.  No intrahepatic or extrahepatic ductal dilatation.  2.4 x 2.1 cm left upper pole renal cyst.  1.2 x 1.5 cm right lower pole renal cyst.  No renal calculi or hydronephrosis.  No evidence of bowel obstruction.  Moderate stool in the colon.  Atherosclerotic calcifications of the abdominal aorta and branch vessels.  No suspicious abdominopelvic lymphadenopathy.  Small volume pelvic ascites.  Status post hysterectomy.  No adnexal masses.  Bladder is thick-walled with mild perivesical stranding.   Degenerative changes of the visualized thoracolumbar spine.  IMPRESSION: No evidence of bowel obstruction.  Moderate stool in the colon.  Thick-walled bladder with mild perivesical stranding, correlate for cystitis.  Small volume pelvic ascites.   Original Report Authenticated By: Charline Bills, M.D.    Dg Chest 2 View  06/25/2012  *RADIOLOGY REPORT*  Clinical Data: Chest pain.  Hypertension.  CHEST - 2 VIEW  Comparison: 05/29/2012  Findings: Stable appearance of cardiac pacemakers since previous study.  Shallow inspiration with segmental elevation of the right hemidiaphragm.  Cardiac enlargement with mild pulmonary vascular congestion.  No evidence of edema.  No focal airspace consolidation.  No blunting of costophrenic angles.  No pneumothorax.  Mediastinal contours appear intact.  Degenerative changes in the thoracic spine.  No significant change since previous study.  IMPRESSION: Cardiac enlargement with mild pulmonary vascular congestion, similar previous study.   Original Report Authenticated By: Marlon Pel, M.D.      1. Pyelonephritis   2. Abdominal pain   3. Vomiting   4. Dementia       MDM  Pt stable in ED with no significant deterioration in condition.Patient / Family / Caregiver informed of clinical course, understand medical decision-making process, and agree with plan.        Hurman Horn, MD 06/26/12 [phone]

## 2012-06-26 NOTE — ED Notes (Signed)
CT called regarding pt's CT scan. CT states tech is in transport to pt's room

## 2012-06-26 NOTE — ED Notes (Signed)
PTAR called for transport.  

## 2012-06-28 LAB — URINE CULTURE: Colony Count: >=100000

## 2012-06-29 NOTE — ED Notes (Signed)
+   Urine Patient treated with Kelfex-sensitive to same-chart appended per protocol MD. 

## 2012-06-30 ENCOUNTER — Encounter (HOSPITAL_COMMUNITY): Payer: Self-pay | Admitting: Pharmacy Technician

## 2012-07-01 ENCOUNTER — Encounter (HOSPITAL_COMMUNITY): Payer: Self-pay | Admitting: *Deleted

## 2012-07-01 NOTE — Progress Notes (Signed)
Faxed ICD form to [facility] device clinic.

## 2012-07-02 ENCOUNTER — Encounter (HOSPITAL_COMMUNITY): Payer: Self-pay

## 2012-07-02 ENCOUNTER — Inpatient Hospital Stay (HOSPITAL_COMMUNITY)
Admission: RE | Admit: 2012-07-02 | Discharge: 2012-07-06 | DRG: 240 | Disposition: A | Discharge status: Skilled Nursing Facility | Payer: Medicare Other | Source: Ambulatory Visit | Attending: Orthopedic Surgery | Admitting: Orthopedic Surgery

## 2012-07-02 ENCOUNTER — Encounter (HOSPITAL_COMMUNITY)
Admission: RE | Disposition: A | Discharge status: Skilled Nursing Facility | Payer: Self-pay | Source: Ambulatory Visit | Attending: Orthopedic Surgery

## 2012-07-02 ENCOUNTER — Ambulatory Visit (HOSPITAL_COMMUNITY): Payer: Medicare Other

## 2012-07-02 DIAGNOSIS — I5032 Chronic diastolic (congestive) heart failure: Secondary | ICD-10-CM | POA: Diagnosis present

## 2012-07-02 DIAGNOSIS — S98919A Complete traumatic amputation of unspecified foot, level unspecified, initial encounter: Secondary | ICD-10-CM

## 2012-07-02 DIAGNOSIS — Z950 Presence of cardiac pacemaker: Secondary | ICD-10-CM

## 2012-07-02 DIAGNOSIS — N189 Chronic kidney disease, unspecified: Secondary | ICD-10-CM | POA: Diagnosis present

## 2012-07-02 DIAGNOSIS — I2510 Atherosclerotic heart disease of native coronary artery without angina pectoris: Secondary | ICD-10-CM | POA: Diagnosis present

## 2012-07-02 DIAGNOSIS — H5440 Blindness, one eye, unspecified eye: Secondary | ICD-10-CM | POA: Diagnosis present

## 2012-07-02 DIAGNOSIS — I252 Old myocardial infarction: Secondary | ICD-10-CM

## 2012-07-02 DIAGNOSIS — E1169 Type 2 diabetes mellitus with other specified complication: Secondary | ICD-10-CM | POA: Diagnosis present

## 2012-07-02 DIAGNOSIS — I96 Gangrene, not elsewhere classified: Secondary | ICD-10-CM

## 2012-07-02 DIAGNOSIS — Z8673 Personal history of transient ischemic attack (TIA), and cerebral infarction without residual deficits: Secondary | ICD-10-CM

## 2012-07-02 DIAGNOSIS — I129 Hypertensive chronic kidney disease with stage 1 through stage 4 chronic kidney disease, or unspecified chronic kidney disease: Secondary | ICD-10-CM | POA: Diagnosis present

## 2012-07-02 DIAGNOSIS — E039 Hypothyroidism, unspecified: Secondary | ICD-10-CM | POA: Diagnosis present

## 2012-07-02 DIAGNOSIS — I509 Heart failure, unspecified: Secondary | ICD-10-CM | POA: Diagnosis present

## 2012-07-02 DIAGNOSIS — I70269 Atherosclerosis of native arteries of extremities with gangrene, unspecified extremity: Principal | ICD-10-CM | POA: Diagnosis present

## 2012-07-02 DIAGNOSIS — Z9981 Dependence on supplemental oxygen: Secondary | ICD-10-CM

## 2012-07-02 DIAGNOSIS — Z7982 Long term (current) use of aspirin: Secondary | ICD-10-CM

## 2012-07-02 DIAGNOSIS — F0390 Unspecified dementia without behavioral disturbance: Secondary | ICD-10-CM | POA: Diagnosis present

## 2012-07-02 DIAGNOSIS — E785 Hyperlipidemia, unspecified: Secondary | ICD-10-CM | POA: Diagnosis present

## 2012-07-02 DIAGNOSIS — Z794 Long term (current) use of insulin: Secondary | ICD-10-CM

## 2012-07-02 HISTORY — DX: Other specified postprocedural states: Z98.890

## 2012-07-02 HISTORY — PX: AMPUTATION: SHX166

## 2012-07-02 HISTORY — DX: Nausea with vomiting, unspecified: R11.2

## 2012-07-02 LAB — CBC
HCT: 35.5 % — ABNORMAL LOW (ref 36.0–46.0)
Hemoglobin: 11.2 g/dL — ABNORMAL LOW (ref 12.0–15.0)
MCH: 29.3 pg (ref 26.0–34.0)
MCHC: 31.5 g/dL (ref 30.0–36.0)
MCV: 92.9 fL (ref 78.0–100.0)

## 2012-07-02 LAB — PROTIME-INR
INR: 1.32 (ref 0.00–1.49)
Prothrombin Time: 16.1 seconds — ABNORMAL HIGH (ref 11.6–15.2)

## 2012-07-02 LAB — BASIC METABOLIC PANEL
BUN: 25 mg/dL — ABNORMAL HIGH (ref 6–23)
CO2: 30 mEq/L (ref 19–32)
Chloride: 99 mEq/L (ref 96–112)
GFR calc non Af Amer: 44 mL/min — ABNORMAL LOW (ref >90)
Glucose, Bld: 104 mg/dL — ABNORMAL HIGH (ref 70–99)
Potassium: 5.5 mEq/L — ABNORMAL HIGH (ref 3.5–5.1)

## 2012-07-02 LAB — GLUCOSE, CAPILLARY: Glucose-Capillary: 52 mg/dL — ABNORMAL LOW (ref 70–99)

## 2012-07-02 SURGERY — AMPUTATION, ABOVE KNEE
Anesthesia: Spinal | Site: Foot | Laterality: Left | Wound class: Clean

## 2012-07-02 MED ORDER — WARFARIN - PHARMACIST DOSING INPATIENT: Freq: Every day | Status: DC

## 2012-07-02 MED ORDER — INSULIN ASPART 100 UNIT/ML ~~LOC~~ SOLN
4.0000 [IU] | Freq: Three times a day (TID) | SUBCUTANEOUS | Status: DC
  Administered 2012-07-02 – 2012-07-04 (×6): 4 [IU] via SUBCUTANEOUS

## 2012-07-02 MED ORDER — CEFAZOLIN SODIUM 1-5 GM-% IV SOLN
2.0000 g | Freq: Once | INTRAVENOUS | Status: AC
  Administered 2012-07-02: 2 g via INTRAVENOUS

## 2012-07-02 MED ORDER — MIDAZOLAM HCL 5 MG/5ML IJ SOLN
INTRAMUSCULAR | Status: DC | PRN
  Administered 2012-07-02: 1 mg via INTRAVENOUS

## 2012-07-02 MED ORDER — METOPROLOL TARTRATE 25 MG PO TABS
25.0000 mg | ORAL_TABLET | Freq: Two times a day (BID) | ORAL | Status: DC
  Administered 2012-07-03 – 2012-07-06 (×8): 25 mg via ORAL
  Filled 2012-07-02 (×9): qty 1

## 2012-07-02 MED ORDER — LEVOTHYROXINE SODIUM 88 MCG PO TABS
88.0000 ug | ORAL_TABLET | Freq: Every day | ORAL | Status: DC
  Administered 2012-07-03 – 2012-07-06 (×4): 88 ug via ORAL
  Filled 2012-07-02 (×5): qty 1

## 2012-07-02 MED ORDER — LINAGLIPTIN 5 MG PO TABS
5.0000 mg | ORAL_TABLET | Freq: Every day | ORAL | Status: DC
  Administered 2012-07-03 – 2012-07-06 (×4): 5 mg via ORAL
  Filled 2012-07-02 (×4): qty 1

## 2012-07-02 MED ORDER — WARFARIN SODIUM 5 MG PO TABS
5.0000 mg | ORAL_TABLET | Freq: Once | ORAL | Status: AC
  Administered 2012-07-03: 5 mg via ORAL
  Filled 2012-07-02 (×2): qty 1

## 2012-07-02 MED ORDER — OXYCODONE-ACETAMINOPHEN 5-325 MG PO TABS: 1.0000 | ORAL_TABLET | ORAL | Status: DC | PRN

## 2012-07-02 MED ORDER — CEFAZOLIN SODIUM-DEXTROSE 2-3 GM-% IV SOLR
INTRAVENOUS | Status: AC
  Filled 2012-07-02: qty 50

## 2012-07-02 MED ORDER — WARFARIN VIDEO
Freq: Once | Status: AC
  Administered 2012-07-03: 12:00:00

## 2012-07-02 MED ORDER — 0.9 % SODIUM CHLORIDE (POUR BTL) OPTIME
TOPICAL | Status: DC | PRN
  Administered 2012-07-02: 1000 mL

## 2012-07-02 MED ORDER — HYDROMORPHONE HCL PF 1 MG/ML IJ SOLN
0.5000 mg | INTRAMUSCULAR | Status: DC | PRN
  Administered 2012-07-02 (×2): 1 mg via INTRAVENOUS
  Filled 2012-07-02 (×2): qty 1

## 2012-07-02 MED ORDER — CEFAZOLIN SODIUM-DEXTROSE 2-3 GM-% IV SOLR
2.0000 g | Freq: Four times a day (QID) | INTRAVENOUS | Status: AC
  Administered 2012-07-02 – 2012-07-03 (×3): 2 g via INTRAVENOUS
  Filled 2012-07-02 (×3): qty 50

## 2012-07-02 MED ORDER — ONDANSETRON HCL 4 MG PO TABS: 4.0000 mg | ORAL_TABLET | Freq: Four times a day (QID) | ORAL | Status: DC | PRN

## 2012-07-02 MED ORDER — PRO-STAT SUGAR FREE PO LIQD
30.0000 mL | Freq: Two times a day (BID) | ORAL | Status: DC
  Administered 2012-07-03 – 2012-07-06 (×6): 30 mL via ORAL
  Filled 2012-07-02 (×9): qty 30

## 2012-07-02 MED ORDER — HYDROMORPHONE HCL PF 1 MG/ML IJ SOLN: 0.2500 mg | INTRAMUSCULAR | Status: DC | PRN

## 2012-07-02 MED ORDER — SODIUM CHLORIDE 0.9 % IV SOLN
INTRAVENOUS | Status: DC | PRN
  Administered 2012-07-02: 14:00:00 via INTRAVENOUS

## 2012-07-02 MED ORDER — METHOCARBAMOL 500 MG PO TABS
500.0000 mg | ORAL_TABLET | Freq: Four times a day (QID) | ORAL | Status: DC | PRN
  Administered 2012-07-03 – 2012-07-05 (×2): 500 mg via ORAL
  Filled 2012-07-02 (×2): qty 1

## 2012-07-02 MED ORDER — DEXTROSE 50 % IV SOLN
INTRAVENOUS | Status: AC
  Filled 2012-07-02: qty 50

## 2012-07-02 MED ORDER — INSULIN GLARGINE 100 UNIT/ML ~~LOC~~ SOLN
11.0000 [IU] | Freq: Every day | SUBCUTANEOUS | Status: DC
  Administered 2012-07-03 – 2012-07-05 (×3): 11 [IU] via SUBCUTANEOUS

## 2012-07-02 MED ORDER — BUPIVACAINE HCL (PF) 0.75 % IJ SOLN
INTRAMUSCULAR | Status: DC | PRN
  Administered 2012-07-02: 10.5 mg

## 2012-07-02 MED ORDER — COUMADIN BOOK
Freq: Once | Status: AC
  Administered 2012-07-03: 01:00:00
  Filled 2012-07-02: qty 1

## 2012-07-02 MED ORDER — LACTATED RINGERS IV SOLN: INTRAVENOUS | Status: DC | PRN

## 2012-07-02 MED ORDER — METHOCARBAMOL 100 MG/ML IJ SOLN
500.0000 mg | Freq: Four times a day (QID) | INTRAVENOUS | Status: DC | PRN
  Filled 2012-07-02: qty 5

## 2012-07-02 MED ORDER — METHOCARBAMOL 100 MG/ML IJ SOLN
500.0000 mg | INTRAVENOUS | Status: DC
  Filled 2012-07-02: qty 5

## 2012-07-02 MED ORDER — INSULIN ASPART 100 UNIT/ML ~~LOC~~ SOLN
0.0000 [IU] | Freq: Three times a day (TID) | SUBCUTANEOUS | Status: DC
  Administered 2012-07-02: 15 [IU] via SUBCUTANEOUS
  Administered 2012-07-03: 5 [IU] via SUBCUTANEOUS
  Administered 2012-07-03: 0 [IU] via SUBCUTANEOUS
  Administered 2012-07-03: 2 [IU] via SUBCUTANEOUS
  Administered 2012-07-04: 3 [IU] via SUBCUTANEOUS
  Administered 2012-07-04: 12:00:00 via SUBCUTANEOUS
  Administered 2012-07-05: 2 [IU] via SUBCUTANEOUS
  Administered 2012-07-05: 3 [IU] via SUBCUTANEOUS
  Administered 2012-07-05: 5 [IU] via SUBCUTANEOUS
  Administered 2012-07-06 (×2): 3 [IU] via SUBCUTANEOUS

## 2012-07-02 MED ORDER — FERROUS SULFATE 325 (65 FE) MG PO TABS
325.0000 mg | ORAL_TABLET | Freq: Three times a day (TID) | ORAL | Status: DC
  Administered 2012-07-03 – 2012-07-06 (×10): 325 mg via ORAL
  Filled 2012-07-02 (×15): qty 1

## 2012-07-02 MED ORDER — ONDANSETRON HCL 4 MG/2ML IJ SOLN
4.0000 mg | Freq: Four times a day (QID) | INTRAMUSCULAR | Status: DC | PRN
  Filled 2012-07-02: qty 2

## 2012-07-02 MED ORDER — HYDROCODONE-ACETAMINOPHEN 5-325 MG PO TABS
1.0000 | ORAL_TABLET | ORAL | Status: DC | PRN
  Administered 2012-07-03 – 2012-07-04 (×6): 2 via ORAL
  Administered 2012-07-05: 1 via ORAL
  Filled 2012-07-02 (×7): qty 2
  Filled 2012-07-02: qty 1

## 2012-07-02 MED ORDER — MUPIROCIN 2 % EX OINT
TOPICAL_OINTMENT | Freq: Two times a day (BID) | CUTANEOUS | Status: DC
Start: 2012-07-02 — End: 2012-07-02
  Administered 2012-07-02: 1 via NASAL
  Filled 2012-07-02: qty 22

## 2012-07-02 MED ORDER — PROPOFOL 10 MG/ML IV BOLUS
INTRAVENOUS | Status: DC | PRN
  Administered 2012-07-02: 20 mg via INTRAVENOUS

## 2012-07-02 MED ORDER — METOCLOPRAMIDE HCL 5 MG PO TABS
5.0000 mg | ORAL_TABLET | Freq: Three times a day (TID) | ORAL | Status: DC | PRN
  Filled 2012-07-02: qty 2

## 2012-07-02 MED ORDER — GLUCOSE 40 % PO GEL
ORAL | Status: AC
  Filled 2012-07-02: qty 1

## 2012-07-02 MED ORDER — METOCLOPRAMIDE HCL 5 MG/ML IJ SOLN: 5.0000 mg | Freq: Three times a day (TID) | INTRAMUSCULAR | Status: DC | PRN

## 2012-07-02 MED ORDER — SENNA 8.6 MG PO TABS
2.0000 | ORAL_TABLET | Freq: Every day | ORAL | Status: DC
  Administered 2012-07-03 – 2012-07-05 (×4): 17.2 mg via ORAL
  Filled 2012-07-02 (×5): qty 2

## 2012-07-02 MED ORDER — ONDANSETRON 8 MG PO TBDP
8.0000 mg | ORAL_TABLET | ORAL | Status: DC | PRN
  Filled 2012-07-02: qty 1

## 2012-07-02 MED ORDER — SERTRALINE HCL 100 MG PO TABS
100.0000 mg | ORAL_TABLET | Freq: Every day | ORAL | Status: DC
  Administered 2012-07-03 – 2012-07-06 (×4): 100 mg via ORAL
  Filled 2012-07-02 (×4): qty 1

## 2012-07-02 MED ORDER — TRAVOPROST 0.004 % OP SOLN
1.0000 [drp] | Freq: Every day | OPHTHALMIC | Status: DC
Start: 2012-07-02 — End: 2012-07-02
  Filled 2012-07-02 (×9): qty 0.1

## 2012-07-02 MED ORDER — TRAVOPROST (BAK FREE) 0.004 % OP SOLN
1.0000 [drp] | Freq: Every day | OPHTHALMIC | Status: DC
  Administered 2012-07-03 – 2012-07-05 (×4): 1 [drp] via OPHTHALMIC
  Filled 2012-07-02: qty 2.5

## 2012-07-02 MED ORDER — ONDANSETRON HCL 4 MG/2ML IJ SOLN
INTRAMUSCULAR | Status: DC | PRN
  Administered 2012-07-02: 4 mg via INTRAVENOUS

## 2012-07-02 SURGICAL SUPPLY — 50 items
BANDAGE ESMARK 6X9 LF (GAUZE/BANDAGES/DRESSINGS) ×1 IMPLANT
BANDAGE GAUZE ELAST BULKY 4 IN (GAUZE/BANDAGES/DRESSINGS) ×2 IMPLANT
BLADE SAW RECIP 87.9 MT (BLADE) ×2 IMPLANT
BNDG CMPR 9X6 STRL LF SNTH (GAUZE/BANDAGES/DRESSINGS) ×1
BNDG COHESIVE 6X5 TAN STRL LF (GAUZE/BANDAGES/DRESSINGS) ×2 IMPLANT
BNDG ESMARK 6X9 LF (GAUZE/BANDAGES/DRESSINGS) ×2
BNDG GAUZE STRTCH 6 (GAUZE/BANDAGES/DRESSINGS) IMPLANT
CLOTH BEACON ORANGE TIMEOUT ST (SAFETY) ×2 IMPLANT
COVER SURGICAL LIGHT HANDLE (MISCELLANEOUS) ×2 IMPLANT
CUFF TOURNIQUET SINGLE 34IN LL (TOURNIQUET CUFF) IMPLANT
CUFF TOURNIQUET SINGLE 44IN (TOURNIQUET CUFF) IMPLANT
DRAIN PENROSE 1/2X12 LTX STRL (WOUND CARE) IMPLANT
DRAPE EXTREMITY T 121X128X90 (DRAPE) ×2 IMPLANT
DRAPE PROXIMA HALF (DRAPES) ×4 IMPLANT
DRAPE U-SHAPE 47X51 STRL (DRAPES) ×2 IMPLANT
DRSG ADAPTIC 3X8 NADH LF (GAUZE/BANDAGES/DRESSINGS) ×2 IMPLANT
DRSG PAD ABDOMINAL 8X10 ST (GAUZE/BANDAGES/DRESSINGS) ×4 IMPLANT
DURAPREP 26ML APPLICATOR (WOUND CARE) ×2 IMPLANT
ELECT CAUTERY BLADE 6.4 (BLADE) ×2 IMPLANT
ELECT REM PT RETURN 9FT ADLT (ELECTROSURGICAL) ×2
ELECTRODE REM PT RTRN 9FT ADLT (ELECTROSURGICAL) ×1 IMPLANT
EVACUATOR 1/8 PVC DRAIN (DRAIN) IMPLANT
GLOVE BIOGEL PI IND STRL 9 (GLOVE) ×1 IMPLANT
GLOVE BIOGEL PI INDICATOR 9 (GLOVE) ×1
GLOVE SURG ORTHO 9.0 STRL STRW (GLOVE) ×2 IMPLANT
GOWN PREVENTION PLUS XLARGE (GOWN DISPOSABLE) ×2 IMPLANT
GOWN SRG XL XLNG 56XLVL 4 (GOWN DISPOSABLE) ×1 IMPLANT
GOWN STRL NON-REIN XL XLG LVL4 (GOWN DISPOSABLE) ×2
KIT BASIN OR (CUSTOM PROCEDURE TRAY) ×2 IMPLANT
KIT ROOM TURNOVER OR (KITS) ×2 IMPLANT
MANIFOLD NEPTUNE II (INSTRUMENTS) ×2 IMPLANT
NS IRRIG 1000ML POUR BTL (IV SOLUTION) ×2 IMPLANT
PACK GENERAL/GYN (CUSTOM PROCEDURE TRAY) ×2 IMPLANT
PAD ARMBOARD 7.5X6 YLW CONV (MISCELLANEOUS) ×4 IMPLANT
PAD CAST 4YDX4 CTTN HI CHSV (CAST SUPPLIES) ×1 IMPLANT
PADDING CAST COTTON 4X4 STRL (CAST SUPPLIES) ×1
PADDING CAST COTTON 6X4 STRL (CAST SUPPLIES) ×2 IMPLANT
SPONGE GAUZE 4X4 12PLY (GAUZE/BANDAGES/DRESSINGS) ×2 IMPLANT
SPONGE LAP 18X18 X RAY DECT (DISPOSABLE) IMPLANT
STAPLER VISISTAT 35W (STAPLE) ×2 IMPLANT
STOCKINETTE IMPERVIOUS LG (DRAPES) ×2 IMPLANT
SUT PDS AB 1 CT  36 (SUTURE) ×2
SUT PDS AB 1 CT 36 (SUTURE) ×2 IMPLANT
SUT SILK 2 0 (SUTURE) ×2
SUT SILK 2-0 18XBRD TIE 12 (SUTURE) ×1 IMPLANT
SWAB COLLECTION DEVICE MRSA (MISCELLANEOUS) IMPLANT
TOWEL OR 17X24 6PK STRL BLUE (TOWEL DISPOSABLE) ×2 IMPLANT
TOWEL OR 17X26 10 PK STRL BLUE (TOWEL DISPOSABLE) ×2 IMPLANT
TUBE ANAEROBIC SPECIMEN COL (MISCELLANEOUS) IMPLANT
WATER STERILE IRR 1000ML POUR (IV SOLUTION) ×2 IMPLANT

## 2012-07-02 NOTE — Progress Notes (Signed)
ANTICOAGULATION CONSULT NOTE - Initial Consult  Pharmacy Consult for Warfarin Indication: VTE prophylaxis s/p L AKA on 10/25  Allergies  Allergen Reactions  . Amiodarone     Autonomic dysfunction  . Morphine Sulfate     REACTION: jerking  . Oxycodone Hcl Itching and Other (See Comments)    Feels weird  . Sulfa Antibiotics Other (See Comments)    unknown    Patient Measurements: Height: 5\' 7"  (170.2 cm) IBW/kg (Calculated) : 61.6   Vital Signs: Temp: 97.8 F (36.6 C) (10/25 1640) Temp src: Oral (10/25 1124) BP: 132/63 mmHg (10/25 1640) Pulse Rate: 70  (10/25 1640)  Labs:  Basename 07/02/12 1254  HGB 11.2*  HCT 35.5*  PLT 208  APTT --  LABPROT --  INR --  HEPARINUNFRC --  CREATININE 1.15*  CKTOTAL --  CKMB --  TROPONINI --    The CrCl is unknown because both a height and weight (above a minimum accepted value) are required for this calculation.   Medical History: Past Medical History  Diagnosis Date  . Hypertension   . Dyslipidemia   . CAD (coronary artery disease)     Non-STEMI August, 2010,,,, 40% left main, 30% LAD, 60% OM 2, 40% RCA, 90% OM1-culprit lesion-too small for intervention           . Ejection fraction     EF 55% ,2011 / EF 30% with tachycardia cardiomyopathy / EF 50% January, 2012  . Diabetes mellitus   . Dementia     ?? Early dementia ?? 2012  . GERD (gastroesophageal reflux disease)   . TIA (transient ischemic attack)     History of TIAs  . Hypothyroidism   . PAD (peripheral artery disease)     Carotid endarterectomy, right. Stents right peroneal, right superficial, right popliteal arteries, Dr. Edilia Bo  . Amputation     Right midfoot 2010  . CKD (chronic kidney disease)     Creatinine 1.19 at December 2012 discharge  . Gait disorder   . Vertebrobasilar insufficiency   . Depression   . Hyperkalemia     ACE Inhibitor held  . Acute renal insufficiency     Catheterization August 2010  . Blindness of right eye   . Respiratory  failure     Hypoxic and hypercapnic, 2011, etiology pneumonia  . Orthostatic hypotension     Syncope January 2012 with facial trauma, amiodarone stopped  . Atrial fibrillation 8/10    on pradaxa, s/p AV nodal ablation  . Ventricular tachycardia     20 beats December, 2012, asymptomatic  . Atrial flutter 05/24/09    Hospitalization in [place]; with difficult rate control; TEE cardioversion done EF 30%, secondary to tachycardia, pt did convert back tosinus rhythm; return 10/11, amiodarone restarted 10/11, no longer on it. Recurrence with RVR 08/2011.  [name] Anemia in CKD (chronic kidney disease)   . Gastroparesis 3/11    Abnormal GES  . Drug therapy,  Sotolol     Sotolol started 08/2011.  . Drug therapy     Pradaxa January, 2013  . Complete heart block     s/p AV nodal ablation  . Chronic diastolic CHF (congestive heart failure)     echo 03/2012- LVEF 55-60%, grade 2 diastolic dysfunction, mild LA dilatation, mild MR, paradoxical vent septal motion  . On home O2     prn  . PONV (postoperative nausea and vomiting)     Assessment: 78 y.o. F to start warfarin for VTE px s/p L AKA  on 10/25. The patient is noted to have been on warfarin in the past for Afib, however this was discontinued due to a fall and never resumed. It appears at that time she was stable on a dose of 2.5 mg daily. INR already elevated at baseline at 1.32. Hgb/Hct/Plt ok.  Goal of Therapy:  INR 2-3   Plan:  1. Warfarin 5 mg x 1 dose at 1800 today 2. Daily PT/INR 3. Warfarin book/video 4. Will plan on educating the patient on warfarin prior to discharge 5. Will continue to monitor for any signs/symptoms of bleeding and will follow up with PT/INR in the a.m.   Georgina Pillion, PharmD, BCPS Clinical Pharmacist Pager: [phone] 07/02/2012 6:12 PM

## 2012-07-02 NOTE — Transfer of Care (Signed)
Immediate Anesthesia Transfer of Care Note  Patient: Brittney [name]  Procedure(s) Performed: Procedure(s) (LRB) with comments: AMPUTATION ABOVE KNEE (Left) - Left Above Knee Amputation  Patient Location: PACU  Anesthesia Type: Spinal  Level of Consciousness: patient cooperative and responds to stimulation  Airway & Oxygen Therapy: Patient Spontanous Breathing and Patient connected to face mask oxygen  Post-op Assessment: Report given to PACU RN and Post -op Vital signs reviewed and stable  Post vital signs: Reviewed and stable  Complications: No apparent anesthesia complications

## 2012-07-02 NOTE — Anesthesia Procedure Notes (Signed)
Spinal  Patient location during procedure: OR Start time: 07/02/2012 2:15 PM End time: 07/02/2012 2:25 PM Staffing Anesthesiologist: Eduard Clos EDMOND Performed by: anesthesiologist  Preanesthetic Checklist Completed: patient identified, surgical consent, pre-op evaluation, timeout performed, IV checked, risks and benefits discussed and monitors and equipment checked Spinal Block Patient position: sitting Prep: Betadine Patient monitoring: cardiac monitor, continuous pulse ox and blood pressure Approach: midline Location: L2-3 Injection technique: single-shot Needle Needle type: Spinocan  Needle gauge: 25 G Needle length: 9 cm Assessment Sensory level: T10 Additional Notes Functioning IV was confirmed and monitors were applied. Sterile prep and drape, including hand hygiene and sterile gloves were used. The patient was positioned and the spine was prepped. The skin was anesthetized with lidocaine.  Free flow of clear CSF was obtained prior to injecting local anesthetic into the CSF.  The spinal needle aspirated freely following injection.  The needle was carefully withdrawn.  The patient tolerated the procedure well.

## 2012-07-02 NOTE — Anesthesia Postprocedure Evaluation (Signed)
  Anesthesia Post-op Note  Patient: Brittney [name]  Procedure(s) Performed: Procedure(s) (LRB) with comments: AMPUTATION ABOVE KNEE (Left) - Left Above Knee Amputation  Patient Location: PACU  Anesthesia Type: MAC and Spinal  Level of Consciousness: awake and alert   Airway and Oxygen Therapy: Patient Spontanous Breathing  Post-op Pain: none  Post-op Assessment: Post-op Vital signs reviewed, Patient's Cardiovascular Status Stable, Respiratory Function Stable, Patent Airway, No signs of Nausea or vomiting, No headache, No backache and No residual motor weakness  Post-op Vital Signs: Reviewed and stable  Complications: No apparent anesthesia complications

## 2012-07-02 NOTE — H&P (Signed)
Brittney [name] is an 78 y.o. female.   Chief Complaint: Gangrene left leg HPI: Patient is a 78 year old woman who presents with gangrenous changes of the left foot and left leg.  Past Medical History  Diagnosis Date  . Hypertension   . Dyslipidemia   . CAD (coronary artery disease)     Non-STEMI August, 2010,,,, 40% left main, 30% LAD, 60% OM 2, 40% RCA, 90% OM1-culprit lesion-too small for intervention           . Ejection fraction     EF 55% ,2011 / EF 30% with tachycardia cardiomyopathy / EF 50% January, 2012  . Diabetes mellitus   . Dementia     ?? Early dementia ?? 2012  . GERD (gastroesophageal reflux disease)   . TIA (transient ischemic attack)     History of TIAs  . Hypothyroidism   . PAD (peripheral artery disease)     Carotid endarterectomy, right. Stents right peroneal, right superficial, right popliteal arteries, Dr. Edilia Bo  . Amputation     Right midfoot 2010  . CKD (chronic kidney disease)     Creatinine 1.19 at December 2012 discharge  . Gait disorder   . Vertebrobasilar insufficiency   . Depression   . Hyperkalemia     ACE Inhibitor held  . Acute renal insufficiency     Catheterization August 2010  . Blindness of right eye   . Respiratory failure     Hypoxic and hypercapnic, 2011, etiology pneumonia  . Orthostatic hypotension     Syncope January 2012 with facial trauma, amiodarone stopped  . Atrial fibrillation 8/10    on pradaxa, s/p AV nodal ablation  . Ventricular tachycardia     20 beats December, 2012, asymptomatic  . Atrial flutter 05/24/09    Hospitalization in [place]; with difficult rate control; TEE cardioversion done EF 30%, secondary to tachycardia, pt did convert back tosinus rhythm; return 10/11, amiodarone restarted 10/11, no longer on it. Recurrence with RVR 08/2011.  [name] Anemia in CKD (chronic kidney disease)   . Gastroparesis 3/11    Abnormal GES  . Drug therapy,  Sotolol     Sotolol started 08/2011.  . Drug therapy     Pradaxa  January, 2013  . Complete heart block     s/p AV nodal ablation  . Chronic diastolic CHF (congestive heart failure)     echo 03/2012- LVEF 55-60%, grade 2 diastolic dysfunction, mild LA dilatation, mild MR, paradoxical vent septal motion  . On home O2     prn  . PONV (postoperative nausea and vomiting)     Past Surgical History  Procedure Date  . Total abdominal hysterectomy   . Cholecystectomy   . Carotid endarterectomy   . Amputation 2010    RIGHT MIDFOOT  . Tonsillectomy   . Eye surgeries     Several right  . Pacemaker insertion     SJM Pacemaker implant 9/10  . Pci stent to the right peroneal and right superficial as well as right popliteal arteries     Dr. Edilia Bo  . Av nodal ablation 08/2011    by Dr Ladona Ridgel  . I&d extremity 05/28/2012    Procedure: IRRIGATION AND DEBRIDEMENT EXTREMITY;  Surgeon: Sherren Kerns, MD;  Location: [facility] OR;  Service: Vascular;  Laterality: Left;  possible amputation toes    Family History  Problem Relation Age of Onset  . Cancer Brother     Colon cancer  . Aortic aneurysm Mother   . Colon  polyps Neg Hx   . Liver disease Neg Hx     And no CRC   Social History:  reports that she has never smoked. She has never used smokeless tobacco. She reports that she does not drink alcohol or use illicit drugs.  Allergies:  Allergies  Allergen Reactions  . Amiodarone     Autonomic dysfunction  . Morphine Sulfate     REACTION: jerking  . Oxycodone Hcl Itching and Other (See Comments)    Feels weird  . Sulfa Antibiotics Other (See Comments)    unknown    Medications Prior to Admission  Medication Sig Dispense Refill  . aspirin 81 MG EC tablet Take 1 tablet (81 mg total) by mouth daily. Swallow whole.  30 tablet  12  . cephALEXin (KEFLEX) 500 MG capsule Take 500 mg by mouth 2 (two) times daily. Starting 06/26/12 for 7 days      . doxycycline (VIBRA-TABS) 100 MG tablet Take 100 mg by mouth 2 (two) times daily. 14 day course completed 06/30/12       . feeding supplement (PRO-STAT SUGAR FREE 64) LIQD Take 30 mLs by mouth 2 (two) times daily.      . ferrous sulfate 325 (65 FE) MG tablet Take 1 tablet (325 mg total) by mouth 3 (three) times daily with meals.    3  . HYDROcodone-acetaminophen (NORCO) 5-325 MG per tablet Take 1 tablet by mouth every 6 (six) hours as needed for pain.  10 tablet  0  . insulin aspart (NOVOLOG) 100 UNIT/ML injection Inject 0-10 Units into the skin 4 (four) times daily -  before meals and at bedtime. Per sliding scale: 0-150=0 units; 151-200=2 units; 201-250=4 units; 251-300=6 units; 301-350=8 units; >350=10 units; >450=CALL MD      . insulin glargine (LANTUS) 100 UNIT/ML injection Inject 11 Units into the skin at bedtime.       . Lactobacillus (ACIDOPHILUS PO) Take 1 capsule by mouth 2 (two) times daily. For 14 days starting 06/26/12      . levothyroxine (SYNTHROID, LEVOTHROID) 88 MCG tablet Take 1 tablet (88 mcg total) by mouth daily before breakfast.      . linagliptin (TRADJENTA) 5 MG TABS tablet Take 5 mg by mouth daily.      . metoprolol tartrate (LOPRESSOR) 25 MG tablet Take 25 mg by mouth 2 (two) times daily.      . ondansetron (ZOFRAN-ODT) 8 MG disintegrating tablet Take 8 mg by mouth every 4 (four) hours as needed. 8mg  ODT q4 hours prn nausea      . oxyCODONE (OXY IR/ROXICODONE) 5 MG immediate release tablet Take 5 mg by mouth every 4 (four) hours as needed. For pain      . senna (SENOKOT) 8.6 MG TABS Take 2 tablets (17.2 mg total) by mouth at bedtime.  120 each    . sertraline (ZOLOFT) 100 MG tablet Take 100 mg by mouth daily.       . travoprost, benzalkonium, (TRAVATAN) 0.004 % ophthalmic solution Place 1 drop into both eyes at bedtime.       . vitamin C (ASCORBIC ACID) 500 MG tablet Take 1,000 mg by mouth 2 (two) times daily.      [name] acetaminophen (TYLENOL) 500 MG tablet Take 1,000 mg by mouth 3 (three) times daily as needed. For pain        Results for orders placed during the hospital encounter of  07/02/12 (from the past 48 hour(s))  GLUCOSE, CAPILLARY  Status: Abnormal   Collection Time   07/02/12 11:31 AM      Component Value Range Comment   Glucose-Capillary 117 (*) 70 - 99 mg/dL    No results found.  ROS  Blood pressure 133/84, pulse 70, temperature 98 F (36.7 C), temperature source Oral, resp. rate 18, height 5\' 7"  (1.702 m), SpO2 98.00%. Physical Exam  On examination patient has gangrene of the left foot as well as gangrenous changes extending up to the tibial tubercle. Assessment/Plan Assessment: Gangrene left leg.  Plan: We'll plan for a left above-the-knee amputation. Risks and benefits were discussed patient and family state understand and wish to proceed at this time.  Aubrey Blackard V 07/02/2012, 1:09 PM

## 2012-07-02 NOTE — Op Note (Signed)
OPERATIVE REPORT  DATE OF SURGERY: 07/02/2012  PATIENT:  Brittney [name],  78 y.o. female  PRE-OPERATIVE DIAGNOSIS:  Gangrene Left Foot  POST-OPERATIVE DIAGNOSIS:  Gangrene Left Foot  PROCEDURE:  Procedure(s): AMPUTATION ABOVE KNEE  SURGEON:  Surgeon(s): Nadara Mustard, MD  ANESTHESIA:   spinal  EBL:  250 cc ML  SPECIMEN:  Source of Specimen:  Left leg  TOURNIQUET:  * No tourniquets in log *  PROCEDURE DETAILS: Patient is a 78 year old woman with peripheral vascular disease who has failed revascularization to the left lower extremity has progressive gangrenous changes from the tibial tubercle distally and presents at this time for above-the-knee amputation. Risks and benefits were discussed including infection neurovascular injury nonhealing of the wound need for higher level amputation. Patient states she understands and wished to proceed at this time. Description of procedure patient brought to the operating room underwent a spinal anesthetic. After adequate levels of anesthesia were obtained patient's left lower extremity was prepped using DuraPrep draped into a sterile field the left foot was draped out of the sterile field with an impervious stocking . A fishmouth incision was made just proximal to the knee this was carried down to the bone the bone was resected the sciatic nerve was cut and allowed to retract the vascular bundles were suture ligated with 2-0 silk. The hemostasis was obtained. Deep and superficial fascial layers were closed using #1 PDS the skin was closed using staples. The wound was covered with Adaptic orthopedic sponges ABDs dressing Kerlix and Coban. Patient was taken to the PACU in stable condition.  PLAN OF CARE: Admit to inpatient   PATIENT DISPOSITION:  PACU - hemodynamically stable.   Nadara Mustard, MD 07/02/2012 2:59 PM

## 2012-07-02 NOTE — Preoperative (Signed)
Beta Blockers   Reason not to administer Beta Blockers:Not Applicable, Pt took metoprolol this am 

## 2012-07-02 NOTE — Anesthesia Preprocedure Evaluation (Signed)
Anesthesia Evaluation  Patient identified by MRN, date of birth, ID band Patient awake    Reviewed: Allergy & Precautions, H&P , NPO status , Patient's Chart, lab work & pertinent test results  History of Anesthesia Complications (+) PONV  Airway Mallampati: II TM Distance: >3 FB Neck ROM: Full    Dental No notable dental hx. (+) Edentulous Upper and Edentulous Lower   Pulmonary neg pulmonary ROS,  breath sounds clear to auscultation  Pulmonary exam normal       Cardiovascular hypertension, On Medications and On Home Beta Blockers + CAD and +CHF + dysrhythmias Atrial Fibrillation + pacemaker Rhythm:Regular Rate:Normal     Neuro/Psych PSYCHIATRIC DISORDERS TIAnegative neurological ROS  negative psych ROS   GI/Hepatic Neg liver ROS, Medicated,  Endo/Other  diabetes, Type 1, Insulin DependentHypothyroidism   Renal/GU   negative genitourinary   Musculoskeletal   Abdominal   Peds  Hematology negative hematology ROS (+)   Anesthesia Other Findings   Reproductive/Obstetrics negative OB ROS                           Anesthesia Physical Anesthesia Plan  ASA: III  Anesthesia Plan: Spinal   Post-op Pain Management:    Induction: Intravenous  Airway Management Planned: Simple Face Mask  Additional Equipment:   Intra-op Plan:   Post-operative Plan:   Informed Consent: I have reviewed the patients History and Physical, chart, labs and discussed the procedure including the risks, benefits and alternatives for the proposed anesthesia with the patient or authorized representative who has indicated his/her understanding and acceptance.   Dental advisory given  Plan Discussed with: CRNA  Anesthesia Plan Comments:         Anesthesia Quick Evaluation

## 2012-07-03 LAB — GLUCOSE, CAPILLARY
Glucose-Capillary: 65 mg/dL — ABNORMAL LOW (ref 70–99)
Glucose-Capillary: 113 mg/dL — ABNORMAL HIGH (ref 70–99)

## 2012-07-03 LAB — PROTIME-INR
INR: 1.37 (ref 0.00–1.49)
Prothrombin Time: 16.5 seconds — ABNORMAL HIGH (ref 11.6–15.2)

## 2012-07-03 MED ORDER — WARFARIN SODIUM 5 MG PO TABS
5.0000 mg | ORAL_TABLET | Freq: Once | ORAL | Status: AC
  Administered 2012-07-03: 5 mg via ORAL
  Filled 2012-07-03: qty 1

## 2012-07-03 NOTE — Progress Notes (Signed)
Hypoglycemic Event  CBG:  52  Treatment: 1 tube instant glucose  Symptoms: None  Follow-up CBG: Time: 2331 CBG Result: 45  Possible Reasons for Event: Inadequate meal intake  Comments/MD notified: no    Townsend Roger  Remember to initiate Hypoglycemia Order Set & complete

## 2012-07-03 NOTE — Progress Notes (Signed)
Subjective: 1 Day Post-Op Procedure(s) (LRB): AMPUTATION ABOVE KNEE (Left) Patient reports pain as moderate.  Has had episodes of hypoglycemia.  Objective: Vital signs in last 24 hours: Temp:  [97.1 F (36.2 C)-99.1 F (37.3 C)] 99.1 F (37.3 C) (10/26 0623) Pulse Rate:  [69-78] 69  (10/26 0623) Resp:  [14-28] 18  (10/26 0623) BP: (126-157)/(48-92) 157/92 mmHg (10/26 0623) SpO2:  [97 %-100 %] 99 % (10/26 0623)  Intake/Output from previous day: 10/25 0701 - 10/26 0700 In: 550 [I.V.:550] Out: 450 [Urine:200; Blood:250] Intake/Output this shift: Total I/O In: 120 [P.O.:120] Out: -    Basename 07/02/12 1254  HGB 11.2*    Basename 07/02/12 1254  WBC 9.4  RBC 3.82*  HCT 35.5*  PLT 208    Basename 07/02/12 1254  NA 134*  K 5.5*  CL 99  CO2 30  BUN 25*  CREATININE 1.15*  GLUCOSE 104*  CALCIUM 9.3    Basename 07/03/12 0504 07/02/12 1829  LABPT -- --  INR 1.37 1.32    Incision: dressing C/D/I  Assessment/Plan: 1 Day Post-Op Procedure(s) (LRB): AMPUTATION ABOVE KNEE (Left) Up with therapy  Anwitha Mapes Y 07/03/2012, 8:59 AM

## 2012-07-03 NOTE — Progress Notes (Signed)
Hypoglycemic Event  CBG: 45  Treatment: D50 IV 25 mL  Symptoms: None  Follow-up CBG: Time:2359 CBG Result:117  Possible Reasons for Event: Unknown  Comments/MD notified:no    Townsend Roger  Remember to initiate Hypoglycemia Order Set & complete

## 2012-07-03 NOTE — Progress Notes (Signed)
ANTICOAGULATION CONSULT NOTE - FOLLOW UP  Pharmacy Consult for Warfarin Indication: VTE prophylaxis s/p L AKA on 10/25  Allergies  Allergen Reactions  . Amiodarone     Autonomic dysfunction  . Morphine Sulfate     REACTION: jerking  . Oxycodone Hcl Itching and Other (See Comments)    Feels weird  . Sulfa Antibiotics Other (See Comments)    unknown    Patient Measurements: Height: 5\' 7"  (170.2 cm) IBW/kg (Calculated) : 61.6   Vital Signs: Temp: 98.5 F (36.9 C) (10/26 1451) Temp src: Oral (10/26 1451) BP: 122/54 mmHg (10/26 1451) Pulse Rate: 69  (10/26 1451)  Labs:  Basename 07/03/12 0504 07/02/12 1829 07/02/12 1254  HGB -- -- 11.2*  HCT -- -- 35.5*  PLT -- -- 208  APTT -- -- --  LABPROT 16.5* 16.1* --  INR 1.37 1.32 --  HEPARINUNFRC -- -- --  CREATININE -- -- 1.15*  CKTOTAL -- -- --  CKMB -- -- --  TROPONINI -- -- --    The CrCl is unknown because both a height and weight (above a minimum accepted value) are required for this calculation.   Medical History: Past Medical History  Diagnosis Date  . Hypertension   . Dyslipidemia   . CAD (coronary artery disease)     Non-STEMI August, 2010,,,, 40% left main, 30% LAD, 60% OM 2, 40% RCA, 90% OM1-culprit lesion-too small for intervention           . Ejection fraction     EF 55% ,2011 / EF 30% with tachycardia cardiomyopathy / EF 50% January, 2012  . Diabetes mellitus   . Dementia     ?? Early dementia ?? 2012  . GERD (gastroesophageal reflux disease)   . TIA (transient ischemic attack)     History of TIAs  . Hypothyroidism   . PAD (peripheral artery disease)     Carotid endarterectomy, right. Stents right peroneal, right superficial, right popliteal arteries, Dr. Edilia Bo  . Amputation     Right midfoot 2010  . CKD (chronic kidney disease)     Creatinine 1.19 at December 2012 discharge  . Gait disorder   . Vertebrobasilar insufficiency   . Depression   . Hyperkalemia     ACE Inhibitor held  . Acute  renal insufficiency     Catheterization August 2010  . Blindness of right eye   . Respiratory failure     Hypoxic and hypercapnic, 2011, etiology pneumonia  . Orthostatic hypotension     Syncope January 2012 with facial trauma, amiodarone stopped  . Atrial fibrillation 8/10    on pradaxa, s/p AV nodal ablation  . Ventricular tachycardia     20 beats December, 2012, asymptomatic  . Atrial flutter 05/24/09    Hospitalization in [place]; with difficult rate control; TEE cardioversion done EF 30%, secondary to tachycardia, pt did convert back tosinus rhythm; return 10/11, amiodarone restarted 10/11, no longer on it. Recurrence with RVR 08/2011.  [name] Anemia in CKD (chronic kidney disease)   . Gastroparesis 3/11    Abnormal GES  . Drug therapy,  Sotolol     Sotolol started 08/2011.  . Drug therapy     Pradaxa January, 2013  . Complete heart block     s/p AV nodal ablation  . Chronic diastolic CHF (congestive heart failure)     echo 03/2012- LVEF 55-60%, grade 2 diastolic dysfunction, mild LA dilatation, mild MR, paradoxical vent septal motion  . On home O2  prn  . PONV (postoperative nausea and vomiting)     Assessment: 78 y.o. F to start warfarin for VTE px s/p L AKA on 10/25. The patient is noted to have been on warfarin in the past for Afib, however this was discontinued due to a fall and never resumed. It appears at that time she was stable on a dose of 2.5 mg daily. INR elevated at baseline at 1.32, now 1.37 after 5mg  x1. Hgb/Hct/Plt ok.  Goal of Therapy:  INR 2-3   Plan:  1. Warfarin 5 mg x 1 dose at 1800 today 2. Daily PT/INR 3. Will plan on educating the patient on warfarin prior to discharge 4. Will continue to monitor for any signs/symptoms of bleeding and will follow up with PT/INR in the a.m.   Thank you for the consult.  Tomi Bamberger, PharmD Clinical Pharmacist Pager: [phone] Pharmacy: [phone] 07/03/2012 3:17 PM

## 2012-07-03 NOTE — Evaluation (Signed)
Physical Therapy Evaluation Patient Details Name: Brittney [name] MRN: [id number] DOB: [birth year] Today's Date: 07/03/2012 Time: 4098-1191 PT Time Calculation (min): 22 min  PT Assessment / Plan / Recommendation Clinical Impression  Prior mobility level difficult to determine due to pt's dementia.  No family present in room during eval.  RN reports pt was residing at [facility].  PT indicated to progress strength and mobility and to provide education to decrease burden of care at d/c.    PT Assessment  Patient needs continued PT services    Follow Up Recommendations  Post acute inpatient    Does the patient have the potential to tolerate intense rehabilitation   No, Recommend SNF  Barriers to Discharge None      Equipment Recommendations  None recommended by PT    Recommendations for Other Services     Frequency Min 3X/week    Precautions / Restrictions Precautions Precautions: Fall   Pertinent Vitals/Pain       Mobility  Bed Mobility Bed Mobility: Supine to Sit;Sit to Supine Supine to Sit: 1: +2 Total assist;HOB elevated Supine to Sit: Patient Percentage: 50% Sit to Supine: 1: +2 Total assist Sit to Supine: Patient Percentage: 50% Details for Bed Mobility Assistance: verbal cues for sequencing Transfers Transfers: Not assessed    Shoulder Instructions     Exercises     PT Diagnosis: Difficulty walking;Generalized weakness;Acute pain  PT Problem List: Decreased strength;Decreased activity tolerance;Decreased mobility;Decreased safety awareness;Pain PT Treatment Interventions: DME instruction;Functional mobility training;Therapeutic activities;Therapeutic exercise;Patient/family education;Wheelchair mobility training   PT Goals Acute Rehab PT Goals PT Goal Formulation: Patient unable to participate in goal setting Time For Goal Achievement: 07/17/12 Potential to Achieve Goals: Fair Pt will go Supine/Side to Sit: with min assist PT Goal: Supine/Side to Sit -  Progress: Goal set today Pt will go Sit to Supine/Side: with min assist PT Goal: Sit to Supine/Side - Progress: Goal set today Pt will go Sit to Stand: with mod assist;with upper extremity assist PT Goal: Sit to Stand - Progress: Goal set today Pt will go Stand to Sit: with mod assist;with upper extremity assist PT Goal: Stand to Sit - Progress: Goal set today Pt will Transfer Bed to Chair/Chair to Bed: with mod assist PT Transfer Goal: Bed to Chair/Chair to Bed - Progress: Goal set today Pt will Stand: with min assist;1 - 2 min;with bilateral upper extremity support PT Goal: Stand - Progress: Goal set today  Visit Information  Last PT Received On: 07/03/12 Assistance Needed: +2    Subjective Data  Subjective: Pt a poor historian due to dementia.  No family present in room.   Prior Functioning  Home Living Available Help at Discharge: Skilled Nursing Facility (per nursing) Type of Home: Skilled Nursing Facility Prior Function Level of Independence: Needs assistance Able to Take Stairs?: No Driving: No Vocation: Retired Musician: No difficulties    Cognition  Overall Cognitive Status: History of cognitive impairments - at baseline Area of Impairment: Memory;Following commands;Safety/judgement;Problem solving Arousal/Alertness: Awake/alert Orientation Level: Disoriented to;Time Behavior During Session: WFL for tasks performed Memory: Decreased recall of precautions Following Commands: Follows multi-step commands inconsistently Safety/Judgement: Decreased safety judgement for tasks assessed    Extremity/Trunk Assessment     Balance    End of Session PT - End of Session Activity Tolerance: Patient limited by pain Patient left: in bed;with call bell/phone within reach;with bed alarm set Nurse Communication: Mobility status  GP     Ilda Foil 07/03/2012, 3:22 PM  Toniann Fail  Glorianne Manchester, PT  Office # [phone] Pager [phone]

## 2012-07-03 NOTE — Progress Notes (Signed)
Hypoglycemic Event  CBG: 65  Treatment: 15 GM carbohydrate snack  Symptoms: None  Follow-up CBG: Time:0320 CBG Result:113  Possible Reasons for Event: Unknown  Comments/MD notified:no    Townsend Roger  Remember to initiate Hypoglycemia Order Set & complete

## 2012-07-04 LAB — GLUCOSE, CAPILLARY
Glucose-Capillary: 157 mg/dL — ABNORMAL HIGH (ref 70–99)
Glucose-Capillary: 43 mg/dL — CL (ref 70–99)
Glucose-Capillary: 89 mg/dL (ref 70–99)

## 2012-07-04 LAB — CBC
MCH: 29.9 pg (ref 26.0–34.0)
Platelets: 209 10*3/uL (ref 150–400)
RBC: 3.08 MIL/uL — ABNORMAL LOW (ref 3.87–5.11)
WBC: 11.7 10*3/uL — ABNORMAL HIGH (ref 4.0–10.5)

## 2012-07-04 LAB — PROTIME-INR
INR: 2.37 — ABNORMAL HIGH (ref 0.00–1.49)
Prothrombin Time: 24.8 seconds — ABNORMAL HIGH (ref 11.6–15.2)

## 2012-07-04 MED ORDER — WARFARIN SODIUM 2.5 MG PO TABS
2.5000 mg | ORAL_TABLET | Freq: Once | ORAL | Status: AC
  Administered 2012-07-04: 2.5 mg via ORAL
  Filled 2012-07-04: qty 1

## 2012-07-04 NOTE — Progress Notes (Signed)
CBG= 46 ac.  Asymptomatic. Given 4 oz juice per protocol.  Meal arrived and given.

## 2012-07-04 NOTE — Progress Notes (Signed)
ANTICOAGULATION CONSULT NOTE - FOLLOW UP  Pharmacy Consult for Warfarin Indication: VTE prophylaxis s/p L AKA on 10/25  Allergies  Allergen Reactions  . Amiodarone     Autonomic dysfunction  . Morphine Sulfate     REACTION: jerking  . Oxycodone Hcl Itching and Other (See Comments)    Feels weird  . Sulfa Antibiotics Other (See Comments)    unknown    Patient Measurements: Height: 5\' 7"  (170.2 cm) IBW/kg (Calculated) : 61.6   Vital Signs: Temp: 98.3 F (36.8 C) (10/27 0433) BP: 144/57 mmHg (10/27 0433) Pulse Rate: 72  (10/27 0433)  Labs:  Alvira Philips 07/04/12 5784 07/03/12 0504 07/02/12 1829 07/02/12 1254  HGB 9.2* -- -- 11.2*  HCT 28.8* -- -- 35.5*  PLT 209 -- -- 208  APTT -- -- -- --  LABPROT 24.8* 16.5* 16.1* --  INR 2.37* 1.37 1.32 --  HEPARINUNFRC -- -- -- --  CREATININE -- -- -- 1.15*  CKTOTAL -- -- -- --  CKMB -- -- -- --  TROPONINI -- -- -- --    The CrCl is unknown because both a height and weight (above a minimum accepted value) are required for this calculation.   Medical History: Past Medical History  Diagnosis Date  . Hypertension   . Dyslipidemia   . CAD (coronary artery disease)     Non-STEMI August, 2010,,,, 40% left main, 30% LAD, 60% OM 2, 40% RCA, 90% OM1-culprit lesion-too small for intervention           . Ejection fraction     EF 55% ,2011 / EF 30% with tachycardia cardiomyopathy / EF 50% January, 2012  . Diabetes mellitus   . Dementia     ?? Early dementia ?? 2012  . GERD (gastroesophageal reflux disease)   . TIA (transient ischemic attack)     History of TIAs  . Hypothyroidism   . PAD (peripheral artery disease)     Carotid endarterectomy, right. Stents right peroneal, right superficial, right popliteal arteries, Dr. Edilia Bo  . Amputation     Right midfoot 2010  . CKD (chronic kidney disease)     Creatinine 1.19 at December 2012 discharge  . Gait disorder   . Vertebrobasilar insufficiency   . Depression   . Hyperkalemia    ACE Inhibitor held  . Acute renal insufficiency     Catheterization August 2010  . Blindness of right eye   . Respiratory failure     Hypoxic and hypercapnic, 2011, etiology pneumonia  . Orthostatic hypotension     Syncope January 2012 with facial trauma, amiodarone stopped  . Atrial fibrillation 8/10    on pradaxa, s/p AV nodal ablation  . Ventricular tachycardia     20 beats December, 2012, asymptomatic  . Atrial flutter 05/24/09    Hospitalization in [place]; with difficult rate control; TEE cardioversion done EF 30%, secondary to tachycardia, pt did convert back tosinus rhythm; return 10/11, amiodarone restarted 10/11, no longer on it. Recurrence with RVR 08/2011.  [name] Anemia in CKD (chronic kidney disease)   . Gastroparesis 3/11    Abnormal GES  . Drug therapy,  Sotolol     Sotolol started 08/2011.  . Drug therapy     Pradaxa January, 2013  . Complete heart block     s/p AV nodal ablation  . Chronic diastolic CHF (congestive heart failure)     echo 03/2012- LVEF 55-60%, grade 2 diastolic dysfunction, mild LA dilatation, mild MR, paradoxical vent septal motion  .  On home O2     prn  . PONV (postoperative nausea and vomiting)     Assessment: 78 y.o. F to start warfarin for VTE px s/p L AKA on 10/25. The patient is noted to have been on warfarin in the past for Afib, however this was discontinued due to a fall and never resumed. It appears at that time she was stable on a dose of 2.5 mg daily. INR elevated at baseline at 1.32, now 2.37 after 5mg  x2. Hgb/Hct decreased post op, Plt ok   Goal of Therapy:  INR 2-3   Plan:  1. Warfarin 2.5 mg x 1 dose at 1800 today 2. Daily PT/INR 3. Will plan on educating the patient on warfarin prior to discharge 4. Will continue to monitor for any signs/symptoms of bleeding and will follow up with PT/INR in the a.m.   Thank you for the consult.  Tomi Bamberger, PharmD Clinical Pharmacist Pager: [phone] Pharmacy:  [phone] 07/04/2012 3:23 PM

## 2012-07-04 NOTE — Progress Notes (Signed)
Subjective: 2 Days Post-Op Procedure(s) (LRB): AMPUTATION ABOVE KNEE (Left) Patient reports pain as moderate.  Slow mobility with therapy.  May need inpatient rehab consult vs ST-SNF.  Objective: Vital signs in last 24 hours: Temp:  [98.3 F (36.8 C)-98.7 F (37.1 C)] 98.3 F (36.8 C) (10/27 0433) Pulse Rate:  [69-72] 72  (10/27 0433) Resp:  [16-18] 16  (10/27 0433) BP: (122-144)/(50-57) 144/57 mmHg (10/27 0433) SpO2:  [94 %-97 %] 95 % (10/27 0433)  Intake/Output from previous day: 10/26 0701 - 10/27 0700 In: 480 [P.O.:480] Out: 1150 [Urine:1150] Intake/Output this shift:     Basename 07/04/12 0635 07/02/12 1254  HGB 9.2* 11.2*    Basename 07/04/12 0635 07/02/12 1254  WBC 11.7* 9.4  RBC 3.08* 3.82*  HCT 28.8* 35.5*  PLT 209 208    Basename 07/02/12 1254  NA 134*  K 5.5*  CL 99  CO2 30  BUN 25*  CREATININE 1.15*  GLUCOSE 104*  CALCIUM 9.3    Basename 07/04/12 0635 07/03/12 0504  LABPT -- --  INR 2.37* 1.37    Incision: dressing C/D/I  Assessment/Plan: 2 Days Post-Op Procedure(s) (LRB): AMPUTATION ABOVE KNEE (Left) Up with therapy D/C foley  Ardice Boyan Y 07/04/2012, 9:57 AM

## 2012-07-04 NOTE — Progress Notes (Signed)
CBG= 85.  Con't monitor.

## 2012-07-04 NOTE — Progress Notes (Signed)
Dr Magnus Ivan returned page and orders received to D/C meal coverage. No HS coverage ordered.

## 2012-07-05 ENCOUNTER — Encounter (HOSPITAL_COMMUNITY): Payer: Self-pay | Admitting: Orthopedic Surgery

## 2012-07-05 LAB — PROTIME-INR: INR: 3.83 — ABNORMAL HIGH (ref 0.00–1.49)

## 2012-07-05 LAB — GLUCOSE, CAPILLARY
Glucose-Capillary: 219 mg/dL — ABNORMAL HIGH (ref 70–99)
Glucose-Capillary: 132 mg/dL — ABNORMAL HIGH (ref 70–99)

## 2012-07-05 MED ORDER — HYDROCODONE-ACETAMINOPHEN 5-325 MG PO TABS: 1.0000 | ORAL_TABLET | Freq: Four times a day (QID) | ORAL | Status: AC | PRN

## 2012-07-05 MED ORDER — OXYCODONE HCL 5 MG PO TABS: 5.0000 mg | ORAL_TABLET | ORAL | Status: DC | PRN

## 2012-07-05 NOTE — Progress Notes (Signed)
Resident of [facility]. Per Dr. Lajoyce Corners- patient is medically stable for d/c today.  Contacted facility and pt's daughter [name] who refused to allow patient to return to facility and she wants patient placed at [facility] or in [place]. Bed search initiated early today and all attempts were made to secure a SNF bed for d/c today without success.  Bed offer received from [facility] [place] for tomorrow as long as daughter goes to [employer] in the morning to initiate a Medicaid application. IF she does not do this- they will not accept. Discussed same with daughter and notified her that should they rescind their offer- patient would have to return to [facility] tomorrow and she could work on bed transfer. Daughter expressed understanding of same and will go to DSS in the a.m. All other d/c arrangements are in place for patient. Notified pt's nurse of above.  Lorri Frederick. [place]  [phone]

## 2012-07-05 NOTE — Progress Notes (Signed)
UR COMPLETED  

## 2012-07-05 NOTE — Progress Notes (Signed)
ANTICOAGULATION CONSULT NOTE - FOLLOW UP  Pharmacy Consult for Warfarin Indication: VTE prophylaxis s/p L AKA on 10/25  Allergies  Allergen Reactions  . Amiodarone     Autonomic dysfunction  . Morphine Sulfate     REACTION: jerking  . Oxycodone Hcl Itching and Other (See Comments)    Feels weird  . Sulfa Antibiotics Other (See Comments)    unknown    Patient Measurements: Height: 5\' 7"  (170.2 cm) IBW/kg (Calculated) : 61.6   Vital Signs:    Labs:  Basename 07/05/12 0652 07/04/12 0635 07/03/12 0504 07/02/12 1254  HGB -- 9.2* -- 11.2*  HCT -- 28.8* -- 35.5*  PLT -- 209 -- 208  APTT -- -- -- --  LABPROT 35.4* 24.8* 16.5* --  INR 3.83* 2.37* 1.37 --  HEPARINUNFRC -- -- -- --  CREATININE -- -- -- 1.15*  CKTOTAL -- -- -- --  CKMB -- -- -- --  TROPONINI -- -- -- --    The CrCl is unknown because both a height and weight (above a minimum accepted value) are required for this calculation.    Assessment: 78 yo female to continue on warfarin for VTE prophylaxis s/p L AKA on 10/25.  INR elevated at baseline at 1.32. INR today is supratherapeutic at 3.83 after 5 mg x2 and 2.5 mg x1. Hgb/Hct decreased post op, plt stable (209). No bleeding noted per RN.    Goal of Therapy:  INR 2-3   Plan:  1. No warfarin today 2. Daily PT/INR 3. Will continue to monitor for any signs/symptoms of bleeding and CBC as available    Thank you for the consult.  Nicolasa Ducking, PharmD Clinical Pharmacist Pgr [phone] 07/05/2012 10:37 AM

## 2012-07-05 NOTE — Discharge Summary (Signed)
Physician Discharge Summary  Patient ID: Brittney [name] MRN: [id number] DOB/AGE: [birth year] 78 y.o.  Admit date: 07/02/2012 Discharge date: 07/05/2012  Admission Diagnoses: Gangrene left foot  Discharge Diagnoses: Same Active Problems:  * No active hospital problems. *    Discharged Condition: stable  Hospital Course: Patient's course was essentially unremarkable. She underwent a left above-the-knee amputation. Postoperatively patient was stable she did have some hypoglycemic episodes her insulin dosage was decreased. Patient was discharged back to skilled nursing in stable condition.  Consults: None  Significant Diagnostic Studies: labs: Routine labs  Treatments: surgery: See operative note  Discharge Exam: Blood pressure 150/82, pulse 99, temperature 98 F (36.7 C), temperature source Oral, resp. rate 16, height 5\' 7"  (1.702 m), SpO2 96.00%. Incision/Wound: incision clean and dry at time of discharge.  Disposition: 01-Home or Self Care  Discharge Orders    Future Appointments: Provider: Department: Dept Phone: Center:   07/16/2012 10:45 AM [name], MD [facility] [phone] [facility]   07/22/2012 2:30 PM Ginnie Smart, MD [facility] [phone] [facility]     Future Orders Please Complete By Expires   Diet - low sodium heart healthy      Call MD / Call 911      Comments:   If you experience chest pain or shortness of breath, CALL 911 and be transported to the hospital emergency room.  If you develope a fever above 101 F, pus (white drainage) or increased drainage or redness at the wound, or calf pain, call your surgeon's office.   Constipation Prevention      Comments:   Drink plenty of fluids.  Prune juice may be helpful.  You may use a stool softener, such as Colace (over the counter) 100 mg twice a day.  Use MiraLax (over the counter) for constipation as needed.   Increase activity slowly as tolerated          Medication List     As  of 07/05/2012  6:14 AM    STOP taking these medications         cephALEXin 500 MG capsule   Commonly known as: KEFLEX      doxycycline 100 MG tablet   Commonly known as: VIBRA-TABS      TAKE these medications         acetaminophen 500 MG tablet   Commonly known as: TYLENOL   Take 1,000 mg by mouth 3 (three) times daily as needed. For pain      ACIDOPHILUS PO   Take 1 capsule by mouth 2 (two) times daily. For 14 days starting 06/26/12      aspirin 81 MG EC tablet   Take 1 tablet (81 mg total) by mouth daily. Swallow whole.      feeding supplement Liqd   Take 30 mLs by mouth 2 (two) times daily.      ferrous sulfate 325 (65 FE) MG tablet   Take 1 tablet (325 mg total) by mouth 3 (three) times daily with meals.      HYDROcodone-acetaminophen 5-325 MG per tablet   Commonly known as: NORCO/VICODIN   Take 1 tablet by mouth every 6 (six) hours as needed for pain.      insulin aspart 100 UNIT/ML injection   Commonly known as: novoLOG   Inject 0-10 Units into the skin 4 (four) times daily -  before meals and at bedtime. Per sliding scale: 0-150=0 units; 151-200=2 units; 201-250=4 units; 251-300=6 units; 301-350=8 units; >350=10 units; >  450=CALL MD      insulin glargine 100 UNIT/ML injection   Commonly known as: LANTUS   Inject 11 Units into the skin at bedtime.      levothyroxine 88 MCG tablet   Commonly known as: SYNTHROID, LEVOTHROID   Take 1 tablet (88 mcg total) by mouth daily before breakfast.      linagliptin 5 MG Tabs tablet   Commonly known as: TRADJENTA   Take 5 mg by mouth daily.      metoprolol tartrate 25 MG tablet   Commonly known as: LOPRESSOR   Take 25 mg by mouth 2 (two) times daily.      ondansetron 8 MG disintegrating tablet   Commonly known as: ZOFRAN-ODT   Take 8 mg by mouth every 4 (four) hours as needed. 8mg  ODT q4 hours prn nausea      oxyCODONE 5 MG immediate release tablet   Commonly known as: Oxy IR/ROXICODONE   Take 5 mg by mouth every 4  (four) hours as needed. For pain      senna 8.6 MG Tabs   Commonly known as: SENOKOT   Take 2 tablets (17.2 mg total) by mouth at bedtime.      sertraline 100 MG tablet   Commonly known as: ZOLOFT   Take 100 mg by mouth daily.      travoprost (benzalkonium) 0.004 % ophthalmic solution   Commonly known as: TRAVATAN   Place 1 drop into both eyes at bedtime.      vitamin C 500 MG tablet   Commonly known as: ASCORBIC ACID   Take 1,000 mg by mouth 2 (two) times daily.           Follow-up Information    Follow up with DUDA,MARCUS V, MD. In 2 weeks.   Contact information:   [address] [place] [address] [phone]          Signed: Nadara Mustard 07/05/2012, 6:14 AM

## 2012-07-05 NOTE — Progress Notes (Signed)
Physical Therapy Treatment Patient Details Name: Brittney [name] MRN: [id number] DOB: 07/28/1932 Today's Date: 07/05/2012 Time: 7829-5621 PT Time Calculation (min): 28 min  PT Assessment / Plan / Recommendation Comments on Treatment Session  Pt able to perform squat pivot transfer with +2 (A).  Pt more alert and able to progress overall mobility.  Pt main goal was to sit up chair to eat lunch.  Pt to possible d/c today to SNF.    Follow Up Recommendations  Post acute inpatient     Does the patient have the potential to tolerate intense rehabilitation  No, Recommend SNF  Barriers to Discharge        Equipment Recommendations  None recommended by PT    Recommendations for Other Services    Frequency Min 3X/week   Plan Discharge plan remains appropriate;Frequency remains appropriate    Precautions / Restrictions Precautions Precautions: Fall   Pertinent Vitals/Pain Does not rate but facial grimace with mobility    Mobility  Bed Mobility Bed Mobility: Supine to Sit;Sit to Supine Rolling Right: 3: Mod assist Right Sidelying to Sit: 3: Mod assist Details for Bed Mobility Assistance: (A) to initiate roll and complete roll to right side.  (A) to elevate shoulders OOB with cues for proper hand placement.  Pt able to improve overall bed mobility by rolling and using rail. Transfers Transfers: Heritage manager Transfers: 1: +2 Total assist Squat Pivot Transfers: Patient Percentage: 40% Details for Transfer Assistance: +2 (A) to advance hips to recliner with max cues for proper technique Ambulation/Gait Ambulation/Gait Assistance: Not tested (comment)    Exercises     PT Diagnosis:    PT Problem List:   PT Treatment Interventions:     PT Goals Acute Rehab PT Goals PT Goal Formulation: Patient unable to participate in goal setting Time For Goal Achievement: 07/17/12 Potential to Achieve Goals: Fair Pt will go Supine/Side to Sit: with min assist PT Goal:  Supine/Side to Sit - Progress: Progressing toward goal Pt will go Sit to Supine/Side: with min assist PT Goal: Sit to Supine/Side - Progress: Progressing toward goal Pt will go Sit to Stand: with mod assist;with upper extremity assist PT Goal: Sit to Stand - Progress: Progressing toward goal Pt will go Stand to Sit: with mod assist;with upper extremity assist PT Goal: Stand to Sit - Progress: Progressing toward goal Pt will Transfer Bed to Chair/Chair to Bed: with mod assist PT Transfer Goal: Bed to Chair/Chair to Bed - Progress: Progressing toward goal  Visit Information  Last PT Received On: 07/05/12 Assistance Needed: +2    Subjective Data  Subjective: I would like to sit up to eat my lunch. Patient Stated Goal: Did not set   Cognition  Overall Cognitive Status: History of cognitive impairments - at baseline Area of Impairment: Memory;Following commands;Safety/judgement;Problem solving Arousal/Alertness: Awake/alert Orientation Level: Disoriented to;Time Behavior During Session: WFL for tasks performed Memory: Decreased recall of precautions Following Commands: Follows multi-step commands inconsistently Safety/Judgement: Decreased safety judgement for tasks assessed    Balance  Balance Balance Assessed: Yes Static Sitting Balance Static Sitting - Balance Support: Feet supported Static Sitting - Level of Assistance: 5: Stand by assistance Static Sitting - Comment/# of Minutes: ~5 minutes prior to transfer.  End of Session PT - End of Session Equipment Utilized During Treatment: Gait belt Activity Tolerance: Patient tolerated treatment well Patient left: in chair;with call bell/phone within reach Nurse Communication: Mobility status   GP     Toi Stelly 07/05/2012, 1:17 PM  [place], [place] DPT [phone]

## 2012-07-06 LAB — GLUCOSE, CAPILLARY
Glucose-Capillary: 193 mg/dL — ABNORMAL HIGH (ref 70–99)
Glucose-Capillary: 122 mg/dL — ABNORMAL HIGH (ref 70–99)
Glucose-Capillary: 180 mg/dL — ABNORMAL HIGH (ref 70–99)

## 2012-07-06 LAB — PROTIME-INR
INR: 3.44 — ABNORMAL HIGH (ref 0.00–1.49)
Prothrombin Time: 32.7 seconds — ABNORMAL HIGH (ref 11.6–15.2)

## 2012-07-06 NOTE — Clinical Social Work Psychosocial (Addendum)
    Clinical Social Work Department BRIEF PSYCHOSOCIAL ASSESSMENT 07/06/2012  Patient:  Brittney [name], Brittney [name]     Account Number:  [id number]     Admit date:  07/02/2012  Clinical Social Worker:  Tiburcio Pea  Date/Time:  07/02/2012 07:00 PM  Referred by:  Physician  Date Referred:  07/02/2012  Other Referral:   Interview type:  Patient Other interview type:    PSYCHOSOCIAL DATA Living Status:  FACILITY Admitted from facility:  [facility] [place] Level of care:  Skilled Nursing Facility Primary support name:  Shiran Claw Primary support relationship to patient:  CHILD, ADULT Degree of support available:   Good support    CURRENT CONCERNS Current Concerns  Post-Acute Placement   Other Concerns:    SOCIAL WORK ASSESSMENT / PLAN Resident of [facility]. Met with patient today- she plans to return to facilty when medically stable. Message left for admissions director of  [facility] [place] and pt's daughterLupita Leash. Fl2 will be placed on chart for MD's signature.   Assessment/plan status:  Psychosocial Support/Ongoing Assessment of Needs Other assessment/ plan:   Information/referral to community resources:   None    PATIENT'S/FAMILY'S RESPONSE TO PLAN OF CARE: Plan return to SNF when medically stable per MD.  Patient is in agreement.  She states she has been there about 1 month.

## 2012-07-06 NOTE — Progress Notes (Signed)
ANTICOAGULATION CONSULT NOTE - FOLLOW UP  Pharmacy Consult for Warfarin Indication: VTE prophylaxis s/p L AKA on 10/25  Allergies  Allergen Reactions  . Amiodarone     Autonomic dysfunction  . Morphine Sulfate     REACTION: jerking  . Oxycodone Hcl Itching and Other (See Comments)    Feels weird  . Sulfa Antibiotics Other (See Comments)    unknown    Patient Measurements: Height: 5\' 7"  (170.2 cm) IBW/kg (Calculated) : 61.6   Vital Signs: Temp: 98 F (36.7 C) (10/29 0614) Temp src: Oral (10/29 0614) BP: 135/59 mmHg (10/29 0614) Pulse Rate: 71  (10/29 0614)  Labs:  Alvira Philips 07/06/12 0735 07/05/12 0652 07/04/12 0635  HGB -- -- 9.2*  HCT -- -- 28.8*  PLT -- -- 209  APTT -- -- --  LABPROT 32.7* 35.4* 24.8*  INR 3.44* 3.83* 2.37*  HEPARINUNFRC -- -- --  CREATININE -- -- --  CKTOTAL -- -- --  CKMB -- -- --  TROPONINI -- -- --    The CrCl is unknown because both a height and weight (above a minimum accepted value) are required for this calculation.  Assessment: 78 yo female to continue on warfarin for VTE prophylaxis s/p L AKA on 10/25.  INR elevated at baseline at 1.32. INR today remains supratherapeutic at 3.44 (from 3.83) after 5 mg x2 and 2.5 mg x1. No CBC available today, plts have been stable. No bleeding noted per RN.   Goal of Therapy:  INR 2-3   Plan:  1. No warfarin today 2. Daily PT/INR 3. Will continue to monitor for any signs/symptoms of bleeding and CBC as available    Thank you for the consult.  Nicolasa Ducking, PharmD Clinical Pharmacist Pgr [phone] 07/06/2012 11:25 AM

## 2012-07-06 NOTE — Clinical Social Work Placement (Addendum)
    Clinical Social Work Department CLINICAL SOCIAL WORK PLACEMENT NOTE 07/06/2012  Patient:  Brittney [name], Brittney [name]  Account Number:  [id number] Admit date:  07/02/2012  Clinical Social Worker:  [name] Coda Filler, LCSWA  Date/time:  07/05/2012 01:30 PM  Clinical Social Work is seeking post-discharge placement for this patient at the following level of care:   SKILLED NURSING   (*CSW will update this form in Epic as items are completed)   07/05/2012  Patient/family provided with [facility] Department of Clinical Social Work's list of facilities offering this level of care within the geographic area requested by the patient (or if unable, by the patient's family).  07/05/2012  Patient/family informed of their freedom to choose among providers that offer the needed level of care, that participate in Medicare, Medicaid or managed care program needed by the patient, have an available bed and are willing to accept the patient.  07/05/2012  Patient/family informed of [facility]' ownership interest in [facility], as well as of the fact that they are under no obligation to receive care at this facility.  PASARR submitted to EDS on  PASARR number received from EDS on   FL2 transmitted to all facilities in geographic area requested by pt/family on  07/05/2012 FL2 transmitted to all facilities within larger geographic area on   Patient informed that his/her managed care company has contracts with or will negotiate with  certain facilities, including the following:   Pt has existing PASARR number in place     Patient/family informed of bed offers received: 07/06/2012   Patient chooses bed at [facility] Physician recommends and patient chooses bed at    Patient to be transferred to [facility]  on  07/06/2012 Patient to be transferred to facility by Ambulance  Sharin Mons)  The following physician request were entered in Epic:   Additional Comments: OK per patient and  daughter [name]. Daughter went to [facility] today to complete medicaid application. Ok per MD for d/c Notified SNF and pt's nurse- Ivar Drape of d/c plan.  No further CSW needs identified.  Lorri Frederick. [place]ugh  [phone]

## 2012-07-16 ENCOUNTER — Encounter: Payer: Self-pay | Admitting: Cardiology

## 2012-07-16 ENCOUNTER — Ambulatory Visit (INDEPENDENT_AMBULATORY_CARE_PROVIDER_SITE_OTHER): Payer: Medicare Other | Admitting: Cardiology

## 2012-07-16 VITALS — BP 171/78 | HR 70 | Ht 67.0 in | Wt 160.0 lb

## 2012-07-16 DIAGNOSIS — Z89619 Acquired absence of unspecified leg above knee: Secondary | ICD-10-CM | POA: Insufficient documentation

## 2012-07-16 DIAGNOSIS — Z7901 Long term (current) use of anticoagulants: Secondary | ICD-10-CM

## 2012-07-16 DIAGNOSIS — I5042 Chronic combined systolic (congestive) and diastolic (congestive) heart failure: Secondary | ICD-10-CM | POA: Insufficient documentation

## 2012-07-16 DIAGNOSIS — I2510 Atherosclerotic heart disease of native coronary artery without angina pectoris: Secondary | ICD-10-CM

## 2012-07-16 DIAGNOSIS — I4891 Unspecified atrial fibrillation: Secondary | ICD-10-CM

## 2012-07-16 DIAGNOSIS — N189 Chronic kidney disease, unspecified: Secondary | ICD-10-CM

## 2012-07-16 DIAGNOSIS — S78119A Complete traumatic amputation at level between unspecified hip and knee, initial encounter: Secondary | ICD-10-CM

## 2012-07-16 DIAGNOSIS — I509 Heart failure, unspecified: Secondary | ICD-10-CM

## 2012-07-16 DIAGNOSIS — R0989 Other specified symptoms and signs involving the circulatory and respiratory systems: Secondary | ICD-10-CM

## 2012-07-16 DIAGNOSIS — Z950 Presence of cardiac pacemaker: Secondary | ICD-10-CM

## 2012-07-16 DIAGNOSIS — R9430 Abnormal result of cardiovascular function study, unspecified: Secondary | ICD-10-CM

## 2012-07-16 DIAGNOSIS — E875 Hyperkalemia: Secondary | ICD-10-CM

## 2012-07-16 DIAGNOSIS — I5032 Chronic diastolic (congestive) heart failure: Secondary | ICD-10-CM

## 2012-07-16 DIAGNOSIS — I10 Essential (primary) hypertension: Secondary | ICD-10-CM

## 2012-07-16 NOTE — Assessment & Plan Note (Signed)
Patient continues to have a good ejection fraction. No further workup.

## 2012-07-16 NOTE — Assessment & Plan Note (Signed)
The patient is volume overloaded at this time. Her diuretics have been on hold because of renal insufficiency in the hospital. I feel it is now safe for her to restart her Lasix and watch her renal function carefully.

## 2012-07-16 NOTE — Assessment & Plan Note (Signed)
Coronary disease has been stable despite all of the difficulties. No further workup.

## 2012-07-16 NOTE — Progress Notes (Signed)
HPI  The patient is seen back today to followup atrial fibrillation and fluid overload and renal dysfunction. Since I saw her last in the office August, 2013, she has had significant medical problems. She was hospitalized with ongoing infection in her left foot. The hospitalization was actually quite complicated. Consideration was being given to vascular surgery. However first she had to have some osteomyelitis removed from with part of her foot removed. Then when we were getting ready for vascular surgery she developed acute renal failure. Fortunately this improved. Ultimately however it was felt that she needed an amputation. This was done and she looks much better. As part of today's evaluation I have reviewed the extensive hospital records.  I have reviewed the chemistries through her hospitalization. Most recently 2 weeks ago her BUN was down to 25 with a creatinine of 1.15 and potassium of 5.5. Currently she is not receiving any extra potassium.  She is here with one of her daughters today. This woman is really remarkable. Her attitude is quite good despite a multitude of medical problems. She is recovering after her left AKA. She's not having any chest pain. She has had increased edema in her right leg. Previously she had been on a diuretic regularly. This had been held new the end of her hospitalization due to her renal dysfunction.  In addition the patient has been on Coumadin because of atrial fibrillation in the past. This was held with her current severe illness. Allergies  Allergen Reactions  . Amiodarone     Autonomic dysfunction  . Morphine Sulfate     REACTION: jerking  . Oxycodone Hcl Itching and Other (See Comments)    Feels weird  . Sulfa Antibiotics Other (See Comments)    unknown    Current Outpatient Prescriptions  Medication Sig Dispense Refill  . acetaminophen (TYLENOL) 500 MG tablet Take 1,000 mg by mouth 3 (three) times daily as needed. For pain      . aspirin 81  MG EC tablet Take 1 tablet (81 mg total) by mouth daily. Swallow whole.  30 tablet  12  . docusate sodium (COLACE) 100 MG capsule Take 100 mg by mouth 2 (two) times daily.      . feeding supplement (PRO-STAT SUGAR FREE 64) LIQD Take 30 mLs by mouth 2 (two) times daily.      . ferrous sulfate 325 (65 FE) MG tablet Take 1 tablet (325 mg total) by mouth 3 (three) times daily with meals.    3  . HYDROcodone-acetaminophen (NORCO) 5-325 MG per tablet Take 1 tablet by mouth every 6 (six) hours as needed for pain.  30 tablet  0  . insulin aspart (NOVOLOG) 100 UNIT/ML injection Inject 0-10 Units into the skin 4 (four) times daily -  before meals and at bedtime. Per sliding scale: 0-150=0 units; 151-200=2 units; 201-250=4 units; 251-300=6 units; 301-350=8 units; >350=10 units; >450=CALL MD      . insulin glargine (LANTUS) 100 UNIT/ML injection Inject 20 Units into the skin at bedtime.       [name] levothyroxine (SYNTHROID, LEVOTHROID) 88 MCG tablet Take 1 tablet (88 mcg total) by mouth daily before breakfast.      . linagliptin (TRADJENTA) 5 MG TABS tablet Take 5 mg by mouth daily.      . metoprolol tartrate (LOPRESSOR) 25 MG tablet Take 25 mg by mouth 2 (two) times daily.      . ondansetron (ZOFRAN-ODT) 8 MG disintegrating tablet Take 8 mg by mouth every 4 (  four) hours as needed. 8mg  ODT q4 hours prn nausea      . senna (SENOKOT) 8.6 MG TABS Take 2 tablets (17.2 mg total) by mouth at bedtime.  120 each    . sertraline (ZOLOFT) 100 MG tablet Take 100 mg by mouth daily.       . travoprost, benzalkonium, (TRAVATAN) 0.004 % ophthalmic solution Place 1 drop into both eyes at bedtime.       . vitamin C (ASCORBIC ACID) 500 MG tablet Take 1,000 mg by mouth 2 (two) times daily.        History   Social History  . Marital Status: Widowed    Spouse Name: N/A    Number of Children: 3  . Years of Education: N/A   Occupational History  . RETIRED    Social History Main Topics  . Smoking status: Never Smoker   .  Smokeless tobacco: Never Used  . Alcohol Use: No  . Drug Use: No  . Sexually Active: No   Other Topics Concern  . Not on file   Social History Narrative   Gets regular exercise.    Family History  Problem Relation Age of Onset  . Cancer Brother     Colon cancer  . Aortic aneurysm Mother   . Colon polyps Neg Hx   . Liver disease Neg Hx     And no CRC    Past Medical History  Diagnosis Date  . Hypertension   . Dyslipidemia   . CAD (coronary artery disease)     Non-STEMI August, 2010,,,, 40% left main, 30% LAD, 60% OM 2, 40% RCA, 90% OM1-culprit lesion-too small for intervention           . Ejection fraction     EF 55% ,2011 / EF 30% with tachycardia cardiomyopathy / EF 50% January, 2012  . Diabetes mellitus   . Dementia     ?? Early dementia ?? 2012  . GERD (gastroesophageal reflux disease)   . TIA (transient ischemic attack)     History of TIAs  . Hypothyroidism   . PAD (peripheral artery disease)     Carotid endarterectomy, right. Stents right peroneal, right superficial, right popliteal arteries, Dr. Edilia Bo  . Amputation     Right midfoot 2010  . CKD (chronic kidney disease)     Creatinine 1.19 at December 2012 discharge  . Gait disorder   . Vertebrobasilar insufficiency   . Depression   . Hyperkalemia     ACE Inhibitor held  . Acute renal insufficiency     Catheterization August 2010  . Blindness of right eye   . Respiratory failure     Hypoxic and hypercapnic, 2011, etiology pneumonia  . Orthostatic hypotension     Syncope January 2012 with facial trauma, amiodarone stopped  . Atrial fibrillation 8/10    on pradaxa, s/p AV nodal ablation  . Ventricular tachycardia     20 beats December, 2012, asymptomatic  . Atrial flutter 05/24/09    Hospitalization in [place]; with difficult rate control; TEE cardioversion done EF 30%, secondary to tachycardia, pt did convert back tosinus rhythm; return 10/11, amiodarone restarted 10/11, no longer on it. Recurrence  with RVR 08/2011.  [name] Anemia in CKD (chronic kidney disease)   . Gastroparesis 3/11    Abnormal GES  . Drug therapy,  Sotolol     Sotolol started 08/2011.  . Drug therapy     Pradaxa January, 2013  . Complete heart block  s/p AV nodal ablation  . Chronic diastolic CHF (congestive heart failure)     echo 03/2012- LVEF 55-60%, grade 2 diastolic dysfunction, mild LA dilatation, mild MR, paradoxical vent septal motion  . On home O2     prn  . PONV (postoperative nausea and vomiting)   . S/P AKA (above knee amputation) unilateral     Left AKA October, 2013    Past Surgical History  Procedure Date  . Total abdominal hysterectomy   . Cholecystectomy   . Carotid endarterectomy   . Amputation 2010    RIGHT MIDFOOT  . Tonsillectomy   . Eye surgeries     Several right  . Pacemaker insertion     SJM Pacemaker implant 9/10  . Pci stent to the right peroneal and right superficial as well as right popliteal arteries     Dr. Edilia Bo  . Av nodal ablation 08/2011    by Dr Ladona Ridgel  . I&d extremity 05/28/2012    Procedure: IRRIGATION AND DEBRIDEMENT EXTREMITY;  Surgeon: Sherren Kerns, MD;  Location: [facility] OR;  Service: Vascular;  Laterality: Left;  possible amputation toes  . Amputation 07/02/2012    Procedure: AMPUTATION ABOVE KNEE;  Surgeon: Nadara Mustard, MD;  Location: [facility] OR;  Service: Orthopedics;  Laterality: Left;  Left Above Knee Amputation    Patient Active Problem List  Diagnosis  . Hypertension  . Dyslipidemia  . CAD (coronary artery disease)  . Ejection fraction  . Pacemaker  . Diabetes mellitus  . Dementia  . GERD (gastroesophageal reflux disease)  . TIA (transient ischemic attack)  . Hypothyroidism  . Carotid artery disease  . PAD (peripheral artery disease)  . CKD (chronic kidney disease)  . Gait disorder  . Vertebrobasilar insufficiency  . Warfarin anticoagulation  . Hyperkalemia  . Acute renal insufficiency  . Blindness of right eye  . Respiratory failure    . Orthostatic hypotension  . Ventricular tachycardia  . Drug therapy,  Sotolol  . Atrial fibrillation  . Atrial flutter  . Complete heart block  . Rotator cuff tear, left  . Rotator cuff tendinitis  . Rotator cuff (capsule) sprain  . Hypokalemia  . Cellulitis  . Diabetic foot ulcer  . Hyponatremia  . Osteomyelitis  . Diabetic foot infection  . Acute on chronic diastolic CHF (congestive heart failure)  . Foot abscess  . Acute on chronic renal failure  . End stage renal disease  . PVD (peripheral vascular disease)  . S/P AKA (above knee amputation) unilateral    ROS   Patient denies fever, chills, headache, sweats, rash, change in vision, change in hearing, chest pain, cough, nausea vomiting, urinary symptoms. All other systems are reviewed and are negative.  PHYSICAL EXAM  Patient is fully oriented. She is oriented to person time and place. Affect is normal. There is no jugulovenous distention. Lungs reveal a few scattered rales. There is no respiratory distress. Cardiac exam reveals S1 and S2. Rhythm is regular and paced. Abdomen is soft. I did not remove the wrapping of her left AKA. She has 2+ edema in the right leg. There are no skin rashes.  Filed Vitals:   07/16/12 1043  BP: 171/78  Pulse: 70  Height: 5\' 7"  (1.702 m)  Weight: 160 lb (72.576 kg)  SpO2: 93%   EKG is done today and reviewed by me. I believe the rhythm is paced. However the pacing spikes are not seen well. ASSESSMENT & PLAN

## 2012-07-16 NOTE — Assessment & Plan Note (Signed)
Blood pressure is elevated. I will not change her meds until I see the effect of diuresing her.

## 2012-07-16 NOTE — Assessment & Plan Note (Signed)
Patient has had AV nodal ablation. Therefore the only issue with her atrial fib is the resumption of Coumadin when possible.

## 2012-07-16 NOTE — Patient Instructions (Addendum)
Your physician recommends that you schedule a follow-up appointment in: 3 weeks. Your physician has recommended you make the following change in your medication: Restart furosemide 40 mg daily. All other medications will remain the same. Your physician recommends that you have lab work done on July 21, 2012 at the [facility] for [employer]. Please make sure they send the results to Dr. Myrtis Ser [phone].

## 2012-07-16 NOTE — Assessment & Plan Note (Signed)
Her most recent potassium was elevated. She is not receiving supplements. An ACE inhibitor has been held in the past when her potassium was elevated. She's not on an ACE inhibitor at this time. Diuretic is to be started and will watch her renal function.

## 2012-07-16 NOTE — Assessment & Plan Note (Signed)
In the hospital she went into acute renal failure. However she improved and her most recent creatinine was 1.2.

## 2012-07-16 NOTE — Assessment & Plan Note (Signed)
Her pacemaker is working well and place. No change in therapy.

## 2012-07-16 NOTE — Assessment & Plan Note (Signed)
Patient is recovering from her left AKA.

## 2012-07-16 NOTE — Assessment & Plan Note (Signed)
The patient still has underlying atrial fib although she's had an AV node ablation. It will be optimal at some point to try to restart Coumadin if and when it is safe for her. Currently she is just beginning to ambulate with her AKA

## 2012-07-22 ENCOUNTER — Ambulatory Visit: Payer: Medicare Other | Admitting: Infectious Diseases

## 2012-07-29 ENCOUNTER — Ambulatory Visit: Payer: Medicare Other | Admitting: Infectious Diseases

## 2012-08-03 ENCOUNTER — Telehealth: Payer: Self-pay | Admitting: *Deleted

## 2012-08-03 NOTE — Telephone Encounter (Signed)
Message copied by Eustace Moore on Tue Aug 03, 2012  1:12 PM ------      Message from: [place], [place] D      Created: Tue Aug 03, 2012 10:37 AM       Blood tests for kidney function looks quite good

## 2012-08-03 NOTE — Telephone Encounter (Signed)
Patient's daughter informed via voicemail.

## 2012-08-13 ENCOUNTER — Encounter: Payer: Self-pay | Admitting: *Deleted

## 2012-08-20 ENCOUNTER — Ambulatory Visit (INDEPENDENT_AMBULATORY_CARE_PROVIDER_SITE_OTHER): Payer: Medicare Other | Admitting: *Deleted

## 2012-08-20 ENCOUNTER — Encounter: Payer: Self-pay | Admitting: Internal Medicine

## 2012-08-20 DIAGNOSIS — I442 Atrioventricular block, complete: Secondary | ICD-10-CM

## 2012-08-20 DIAGNOSIS — I4892 Unspecified atrial flutter: Secondary | ICD-10-CM

## 2012-08-20 DIAGNOSIS — I4891 Unspecified atrial fibrillation: Secondary | ICD-10-CM

## 2012-08-20 LAB — PACEMAKER DEVICE OBSERVATION
AL AMPLITUDE: 1.2 mv
BAMS-0001: 150 {beats}/min
BRDY-0002RV: 60 {beats}/min
DEVICE MODEL PM: 2343628
RV LEAD AMPLITUDE: 12 mv
RV LEAD IMPEDENCE PM: 625 Ohm
VENTRICULAR PACING PM: 99

## 2012-08-20 NOTE — Progress Notes (Signed)
Pacer check in clinic  

## 2012-09-15 ENCOUNTER — Ambulatory Visit (INDEPENDENT_AMBULATORY_CARE_PROVIDER_SITE_OTHER): Payer: Medicare Other | Admitting: Cardiology

## 2012-09-15 ENCOUNTER — Encounter: Payer: Self-pay | Admitting: Cardiology

## 2012-09-15 VITALS — BP 144/74 | HR 69 | Ht 67.0 in | Wt 148.0 lb

## 2012-09-15 DIAGNOSIS — I5032 Chronic diastolic (congestive) heart failure: Secondary | ICD-10-CM

## 2012-09-15 DIAGNOSIS — I10 Essential (primary) hypertension: Secondary | ICD-10-CM

## 2012-09-15 DIAGNOSIS — I509 Heart failure, unspecified: Secondary | ICD-10-CM

## 2012-09-15 DIAGNOSIS — N189 Chronic kidney disease, unspecified: Secondary | ICD-10-CM

## 2012-09-15 NOTE — Patient Instructions (Addendum)
Your physician recommends that you schedule a follow-up appointment in: 6 months. You will receive a reminder letter in the mail in about 4 months reminding you to call and schedule your appointment. If you don't receive this letter, please contact our office.  Your physician recommends that you continue on your current medications as directed. Please refer to the Current Medication list given to you today.  Your physician recommends that you return for lab work at [facility] for BMET.

## 2012-09-15 NOTE — Progress Notes (Signed)
HPI  Patient is seen for followup of atrial fibrillation and fluid overload and renal dysfunction. She has very complex problems. However she now is quite stable. I saw her last July 16, 2012. At that time she was recovering from having left leg amputation. She was mildly volume overloaded. I did put her on 40 of Lasix. She had renal dysfunction in the hospital but this did stabilize when she came out. I have not seen any followup labs since that time. She does not have a prosthesis yet. She's not having any chest pain or shortness of breath.  Allergies  Allergen Reactions  . Amiodarone     Autonomic dysfunction  . Morphine Sulfate     REACTION: jerking  . Oxycodone Hcl Itching and Other (See Comments)    Feels weird  . Sulfa Antibiotics Other (See Comments)    unknown    Current Outpatient Prescriptions  Medication Sig Dispense Refill  . acetaminophen (TYLENOL) 500 MG tablet Take 1,000 mg by mouth 3 (three) times daily as needed. For pain      . aspirin 81 MG EC tablet Take 1 tablet (81 mg total) by mouth daily. Swallow whole.  30 tablet  12  . docusate sodium (COLACE) 100 MG capsule Take 100 mg by mouth 2 (two) times daily.      . feeding supplement (PRO-STAT SUGAR FREE 64) LIQD Take 30 mLs by mouth 2 (two) times daily.      . ferrous sulfate 325 (65 FE) MG tablet Take 1 tablet (325 mg total) by mouth 3 (three) times daily with meals.    3  . furosemide (LASIX) 40 MG tablet Take 40 mg by mouth daily.      [name] HYDROcodone-acetaminophen (NORCO) 5-325 MG per tablet Take 1 tablet by mouth every 6 (six) hours as needed for pain.  30 tablet  0  . insulin aspart (NOVOLOG) 100 UNIT/ML injection Inject 0-10 Units into the skin 4 (four) times daily -  before meals and at bedtime. Per sliding scale: 0-150=0 units; 151-200=2 units; 201-250=4 units; 251-300=6 units; 301-350=8 units; >350=10 units; >450=CALL MD      . insulin glargine (LANTUS) 100 UNIT/ML injection Inject 20 Units into the skin at  bedtime.       [name] levothyroxine (SYNTHROID, LEVOTHROID) 88 MCG tablet Take 1 tablet (88 mcg total) by mouth daily before breakfast.      . linagliptin (TRADJENTA) 5 MG TABS tablet Take 5 mg by mouth daily.      . metoprolol tartrate (LOPRESSOR) 25 MG tablet Take 25 mg by mouth 2 (two) times daily.      . ondansetron (ZOFRAN-ODT) 8 MG disintegrating tablet Take 8 mg by mouth every 4 (four) hours as needed. 8mg  ODT q4 hours prn nausea      . senna (SENOKOT) 8.6 MG TABS Take 2 tablets (17.2 mg total) by mouth at bedtime.  120 each    . sertraline (ZOLOFT) 100 MG tablet Take 100 mg by mouth daily.       . travoprost, benzalkonium, (TRAVATAN) 0.004 % ophthalmic solution Place 1 drop into both eyes at bedtime.       . vitamin C (ASCORBIC ACID) 500 MG tablet Take 1,000 mg by mouth 2 (two) times daily.        History   Social History  . Marital Status: Widowed    Spouse Name: N/A    Number of Children: 3  . Years of Education: N/A   Occupational History  .  RETIRED    Social History Main Topics  . Smoking status: Never Smoker   . Smokeless tobacco: Never Used  . Alcohol Use: No  . Drug Use: No  . Sexually Active: No   Other Topics Concern  . Not on file   Social History Narrative   Gets regular exercise.    Family History  Problem Relation Age of Onset  . Cancer Brother     Colon cancer  . Aortic aneurysm Mother   . Colon polyps Neg Hx   . Liver disease Neg Hx     And no CRC    Past Medical History  Diagnosis Date  . Hypertension   . Dyslipidemia   . CAD (coronary artery disease)     Non-STEMI August, 2010,,,, 40% left main, 30% LAD, 60% OM 2, 40% RCA, 90% OM1-culprit lesion-too small for intervention           . Ejection fraction     EF 55% ,2011 / EF 30% with tachycardia cardiomyopathy / EF 50% January, 2012  . Diabetes mellitus   . Dementia     ?? Early dementia ?? 2012  . GERD (gastroesophageal reflux disease)   . TIA (transient ischemic attack)     History of  TIAs  . Hypothyroidism   . PAD (peripheral artery disease)     Carotid endarterectomy, right. Stents right peroneal, right superficial, right popliteal arteries, Dr. Edilia Bo  . Amputation     Right midfoot 2010  . CKD (chronic kidney disease)     Creatinine 1.19 at December 2012 discharge  . Gait disorder   . Vertebrobasilar insufficiency   . Depression   . Hyperkalemia     ACE Inhibitor held  . Acute renal insufficiency     Catheterization August 2010  . Blindness of right eye   . Respiratory failure     Hypoxic and hypercapnic, 2011, etiology pneumonia  . Orthostatic hypotension     Syncope January 2012 with facial trauma, amiodarone stopped  . Atrial fibrillation 8/10    on pradaxa, s/p AV nodal ablation  . Ventricular tachycardia     20 beats December, 2012, asymptomatic  . Atrial flutter 05/24/09    Hospitalization in [place]; with difficult rate control; TEE cardioversion done EF 30%, secondary to tachycardia, pt did convert back tosinus rhythm; return 10/11, amiodarone restarted 10/11, no longer on it. Recurrence with RVR 08/2011.  [name] Anemia in CKD (chronic kidney disease)   . Gastroparesis 3/11    Abnormal GES  . Drug therapy,  Sotolol     Sotolol started 08/2011.  . Drug therapy     Pradaxa January, 2013  . Complete heart block     s/p AV nodal ablation  . Chronic diastolic CHF (congestive heart failure)     echo 03/2012- LVEF 55-60%, grade 2 diastolic dysfunction, mild LA dilatation, mild MR, paradoxical vent septal motion  . On home O2     prn  . PONV (postoperative nausea and vomiting)   . S/P AKA (above knee amputation) unilateral     Left AKA October, 2013    Past Surgical History  Procedure Date  . Total abdominal hysterectomy   . Cholecystectomy   . Carotid endarterectomy   . Amputation 2010    RIGHT MIDFOOT  . Tonsillectomy   . Eye surgeries     Several right  . Pacemaker insertion     SJM Pacemaker implant 9/10  . Pci stent to the right  peroneal and  right superficial as well as right popliteal arteries     Dr. Edilia Bo  . Av nodal ablation 08/2011    by Dr Ladona Ridgel  . I&d extremity 05/28/2012    Procedure: IRRIGATION AND DEBRIDEMENT EXTREMITY;  Surgeon: Sherren Kerns, MD;  Location: [facility] OR;  Service: Vascular;  Laterality: Left;  possible amputation toes  . Amputation 07/02/2012    Procedure: AMPUTATION ABOVE KNEE;  Surgeon: Nadara Mustard, MD;  Location: [facility] OR;  Service: Orthopedics;  Laterality: Left;  Left Above Knee Amputation    Patient Active Problem List  Diagnosis  . Hypertension  . Dyslipidemia  . CAD (coronary artery disease)  . Ejection fraction  . Pacemaker-[employer]ude  . Diabetes mellitus  . Dementia  . GERD (gastroesophageal reflux disease)  . TIA (transient ischemic attack)  . Hypothyroidism  . Carotid artery disease  . PAD (peripheral artery disease)  . CKD (chronic kidney disease)  . Gait disorder  . Vertebrobasilar insufficiency  . Warfarin anticoagulation  . Hyperkalemia  . Acute renal insufficiency  . Blindness of right eye  . Respiratory failure  . Orthostatic hypotension  . Ventricular tachycardia  . Drug therapy,  Sotolol  . Atrial fibrillation  . Atrial flutter  . Complete heart block  . Rotator cuff tear, left  . Rotator cuff tendinitis  . Rotator cuff (capsule) sprain  . Hypokalemia  . Cellulitis  . Diabetic foot ulcer  . Hyponatremia  . Osteomyelitis  . Diabetic foot infection  . Foot abscess  . Acute on chronic renal failure  . End stage renal disease  . PVD (peripheral vascular disease)  . S/P AKA (above knee amputation) unilateral  . Chronic diastolic CHF (congestive heart failure)    ROS   Patient is oriented to person time and place. Affect is normal. There is no jugulovenous distention. Lungs are clear. Respiratory effort is nonlabored. Cardiac exam reveals S1 and S2. There no clicks or significant murmurs. The abdomen is soft. She history sitting in the right leg.  Her left stump is wrapped.  PHYSICAL EXAM  Filed Vitals:   09/15/12 1413  BP: 144/74  Pulse: 69  Height: 5\' 7"  (1.702 m)  Weight: 148 lb (67.132 kg)  SpO2: 96%     ASSESSMENT & PLAN

## 2012-09-15 NOTE — Assessment & Plan Note (Signed)
Blood pressures control. No change in therapy. 

## 2012-09-15 NOTE — Assessment & Plan Note (Signed)
Her volume status is now controlled on the current dose of diuretics. Chemistry will be checked.

## 2012-09-15 NOTE — Assessment & Plan Note (Signed)
Chemistry will be checked in the next few days to be sure that her renal function and potassium are stable and a diuretic.

## 2012-09-24 ENCOUNTER — Emergency Department (HOSPITAL_COMMUNITY)
Admission: EM | Admit: 2012-09-24 | Discharge: 2012-09-24 | Disposition: A | Discharge status: Home / Self Care | Payer: Medicare Other | Attending: Emergency Medicine | Admitting: Emergency Medicine

## 2012-09-24 ENCOUNTER — Encounter (HOSPITAL_COMMUNITY): Payer: Self-pay | Admitting: *Deleted

## 2012-09-24 ENCOUNTER — Emergency Department (HOSPITAL_COMMUNITY): Payer: Medicare Other

## 2012-09-24 DIAGNOSIS — Z862 Personal history of diseases of the blood and blood-forming organs and certain disorders involving the immune mechanism: Secondary | ICD-10-CM | POA: Insufficient documentation

## 2012-09-24 DIAGNOSIS — Z794 Long term (current) use of insulin: Secondary | ICD-10-CM | POA: Insufficient documentation

## 2012-09-24 DIAGNOSIS — Z8673 Personal history of transient ischemic attack (TIA), and cerebral infarction without residual deficits: Secondary | ICD-10-CM | POA: Insufficient documentation

## 2012-09-24 DIAGNOSIS — I10 Essential (primary) hypertension: Secondary | ICD-10-CM | POA: Insufficient documentation

## 2012-09-24 DIAGNOSIS — I509 Heart failure, unspecified: Secondary | ICD-10-CM

## 2012-09-24 DIAGNOSIS — Z8659 Personal history of other mental and behavioral disorders: Secondary | ICD-10-CM | POA: Insufficient documentation

## 2012-09-24 DIAGNOSIS — Z87448 Personal history of other diseases of urinary system: Secondary | ICD-10-CM | POA: Insufficient documentation

## 2012-09-24 DIAGNOSIS — E785 Hyperlipidemia, unspecified: Secondary | ICD-10-CM | POA: Insufficient documentation

## 2012-09-24 DIAGNOSIS — Z8719 Personal history of other diseases of the digestive system: Secondary | ICD-10-CM | POA: Insufficient documentation

## 2012-09-24 DIAGNOSIS — F0390 Unspecified dementia without behavioral disturbance: Secondary | ICD-10-CM | POA: Insufficient documentation

## 2012-09-24 DIAGNOSIS — Z8639 Personal history of other endocrine, nutritional and metabolic disease: Secondary | ICD-10-CM | POA: Insufficient documentation

## 2012-09-24 DIAGNOSIS — N189 Chronic kidney disease, unspecified: Secondary | ICD-10-CM | POA: Insufficient documentation

## 2012-09-24 DIAGNOSIS — E119 Type 2 diabetes mellitus without complications: Secondary | ICD-10-CM | POA: Insufficient documentation

## 2012-09-24 DIAGNOSIS — Z9981 Dependence on supplemental oxygen: Secondary | ICD-10-CM | POA: Insufficient documentation

## 2012-09-24 DIAGNOSIS — Z7982 Long term (current) use of aspirin: Secondary | ICD-10-CM | POA: Insufficient documentation

## 2012-09-24 DIAGNOSIS — I2510 Atherosclerotic heart disease of native coronary artery without angina pectoris: Secondary | ICD-10-CM | POA: Insufficient documentation

## 2012-09-24 DIAGNOSIS — Z8679 Personal history of other diseases of the circulatory system: Secondary | ICD-10-CM | POA: Insufficient documentation

## 2012-09-24 DIAGNOSIS — Z8709 Personal history of other diseases of the respiratory system: Secondary | ICD-10-CM | POA: Insufficient documentation

## 2012-09-24 DIAGNOSIS — S98919A Complete traumatic amputation of unspecified foot, level unspecified, initial encounter: Secondary | ICD-10-CM | POA: Insufficient documentation

## 2012-09-24 DIAGNOSIS — E039 Hypothyroidism, unspecified: Secondary | ICD-10-CM | POA: Insufficient documentation

## 2012-09-24 LAB — TROPONIN I: Troponin I: <0.3 ng/mL (ref <0.30)

## 2012-09-24 LAB — URINALYSIS, MICROSCOPIC ONLY
Bilirubin Urine: NEGATIVE
Nitrite: NEGATIVE
Specific Gravity, Urine: >1.03 — ABNORMAL HIGH (ref 1.005–1.030)
pH: 5.5 (ref 5.0–8.0)

## 2012-09-24 LAB — URINALYSIS, ROUTINE W REFLEX MICROSCOPIC
Bilirubin Urine: NEGATIVE
Ketones, ur: NEGATIVE mg/dL
Leukocytes, UA: NEGATIVE
Nitrite: NEGATIVE
Protein, ur: 100 mg/dL — AB

## 2012-09-24 LAB — COMPREHENSIVE METABOLIC PANEL
AST: 20 U/L (ref 0–37)
Albumin: 3.3 g/dL — ABNORMAL LOW (ref 3.5–5.2)
BUN: 25 mg/dL — ABNORMAL HIGH (ref 6–23)
Creatinine, Ser: 0.95 mg/dL (ref 0.50–1.10)
Total Protein: 7.2 g/dL (ref 6.0–8.3)

## 2012-09-24 LAB — PROTIME-INR
INR: 1.16 (ref 0.00–1.49)
Prothrombin Time: 14.6 seconds (ref 11.6–15.2)

## 2012-09-24 LAB — CBC
HCT: 40 % (ref 36.0–46.0)
MCHC: 32.8 g/dL (ref 30.0–36.0)
MCV: 92.2 fL (ref 78.0–100.0)
Platelets: 193 10*3/uL (ref 150–400)
RDW: 15.1 % (ref 11.5–15.5)

## 2012-09-24 LAB — PRO B NATRIURETIC PEPTIDE: Pro B Natriuretic peptide (BNP): 6226 pg/mL — ABNORMAL HIGH (ref 0–450)

## 2012-09-24 LAB — URINE MICROSCOPIC-ADD ON

## 2012-09-24 MED ORDER — FUROSEMIDE 20 MG PO TABS: 60.0000 mg | ORAL_TABLET | Freq: Every day | ORAL | Status: AC

## 2012-09-24 MED ORDER — NITROGLYCERIN 2 % TD OINT
0.5000 [in_us] | TOPICAL_OINTMENT | Freq: Once | TRANSDERMAL | Status: AC
  Administered 2012-09-24: 0.5 [in_us] via TOPICAL
  Filled 2012-09-24: qty 1

## 2012-09-24 MED ORDER — ASPIRIN 81 MG PO CHEW
324.0000 mg | CHEWABLE_TABLET | Freq: Once | ORAL | Status: AC
  Administered 2012-09-24: 324 mg via ORAL
  Filled 2012-09-24: qty 4

## 2012-09-24 MED ORDER — FUROSEMIDE 10 MG/ML IJ SOLN
80.0000 mg | Freq: Once | INTRAMUSCULAR | Status: AC
  Administered 2012-09-24: 80 mg via INTRAVENOUS
  Filled 2012-09-24: qty 8

## 2012-09-24 NOTE — ED Provider Notes (Signed)
History     CSN: [id number]  Arrival date & time 09/24/12  1647   First MD Initiated Contact with Patient 09/24/12 1656      Chief Complaint  Patient presents with  . Shortness of Breath    (Consider location/radiation/quality/duration/timing/severity/associated sxs/prior treatment) HPI  The patient presents with concerns of chest pain, dyspnea.  Symptoms began approximately 24 hours ago, subtly.  Since onset symptoms have been persistent, with no clear exacerbating or alleviating factors.  The pain is diffuse, pressure-like, anterior, nonradiating.  It is nonexertional pleuritic. There is no associated nausea, vomiting, diarrhea, abdominal pain, lightheadedness, disorientation, confusion. There is some increasing lower extremity edema in her right ankle. No new pain in his ankle. The patient's multiple medical problems and states that she is compliant with all medication.   Past Medical History  Diagnosis Date  . Hypertension   . Dyslipidemia   . CAD (coronary artery disease)     Non-STEMI August, 2010,,,, 40% left main, 30% LAD, 60% OM 2, 40% RCA, 90% OM1-culprit lesion-too small for intervention           . Ejection fraction     EF 55% ,2011 / EF 30% with tachycardia cardiomyopathy / EF 50% January, 2012  . Diabetes mellitus   . Dementia     ?? Early dementia ?? 2012  . GERD (gastroesophageal reflux disease)   . TIA (transient ischemic attack)     History of TIAs  . Hypothyroidism   . PAD (peripheral artery disease)     Carotid endarterectomy, right. Stents right peroneal, right superficial, right popliteal arteries, Dr. Edilia Bo  . Amputation     Right midfoot 2010  . Gait disorder   . Vertebrobasilar insufficiency   . Depression   . Hyperkalemia     ACE Inhibitor held  . Blindness of right eye   . Respiratory failure     Hypoxic and hypercapnic, 2011, etiology pneumonia  . Orthostatic hypotension     Syncope January 2012 with facial trauma, amiodarone stopped    . Atrial fibrillation 8/10    on pradaxa, s/p AV nodal ablation  . Ventricular tachycardia     20 beats December, 2012, asymptomatic  . Atrial flutter 05/24/09    Hospitalization in [place]; with difficult rate control; TEE cardioversion done EF 30%, secondary to tachycardia, pt did convert back tosinus rhythm; return 10/11, amiodarone restarted 10/11, no longer on it. Recurrence with RVR 08/2011.  [name] Anemia in CKD (chronic kidney disease)   . Gastroparesis 3/11    Abnormal GES  . Drug therapy,  Sotolol     Sotolol started 08/2011.  . Drug therapy     Pradaxa January, 2013  . Complete heart block     s/p AV nodal ablation  . Chronic diastolic CHF (congestive heart failure)     echo 03/2012- LVEF 55-60%, grade 2 diastolic dysfunction, mild LA dilatation, mild MR, paradoxical vent septal motion  . On home O2     prn  . PONV (postoperative nausea and vomiting)   . S/P AKA (above knee amputation) unilateral     Left AKA October, 2013  . CKD (chronic kidney disease)     Creatinine 1.19 at December 2012 discharge  . Acute renal insufficiency     Catheterization August 2010    Past Surgical History  Procedure Date  . Total abdominal hysterectomy   . Cholecystectomy   . Carotid endarterectomy   . Amputation 2010    RIGHT MIDFOOT  .  Tonsillectomy   . Eye surgeries     Several right  . Pacemaker insertion     SJM Pacemaker implant 9/10  . Pci stent to the right peroneal and right superficial as well as right popliteal arteries     Dr. Edilia Bo  . Av nodal ablation 08/2011    by Dr Ladona Ridgel  . I&d extremity 05/28/2012    Procedure: IRRIGATION AND DEBRIDEMENT EXTREMITY;  Surgeon: Sherren Kerns, MD;  Location: [facility] OR;  Service: Vascular;  Laterality: Left;  possible amputation toes  . Amputation 07/02/2012    Procedure: AMPUTATION ABOVE KNEE;  Surgeon: Nadara Mustard, MD;  Location: [facility] OR;  Service: Orthopedics;  Laterality: Left;  Left Above Knee Amputation    Family History   Problem Relation Age of Onset  . Cancer Brother     Colon cancer  . Aortic aneurysm Mother   . Colon polyps Neg Hx   . Liver disease Neg Hx     And no CRC    History  Substance Use Topics  . Smoking status: Never Smoker   . Smokeless tobacco: Never Used  . Alcohol Use: No    OB History    Grav Para Term Preterm Abortions TAB SAB Ect Mult Living                  Review of Systems  Constitutional:       Per HPI, otherwise negative  HENT:       Per HPI, otherwise negative  Eyes: Negative.   Respiratory:       Per HPI, otherwise negative  Cardiovascular:       Per HPI, otherwise negative  Gastrointestinal: Negative for vomiting.  Genitourinary: Negative.   Musculoskeletal:       Per HPI, otherwise negative  Skin: Negative.   Neurological: Negative for syncope.    Allergies  Amiodarone; Morphine sulfate; Oxycodone hcl; and Sulfa antibiotics  Home Medications   Current Outpatient Rx  Name  Route  Sig  Dispense  Refill  . ACETAMINOPHEN 500 MG PO TABS   Oral   Take 1,000 mg by mouth 3 (three) times daily as needed. For pain         . ASPIRIN 81 MG PO TBEC   Oral   Take 1 tablet (81 mg total) by mouth daily. Swallow whole.   30 tablet   12   . DOCUSATE SODIUM 100 MG PO CAPS   Oral   Take 100 mg by mouth 2 (two) times daily.         [name] PRO-STAT 64 PO LIQD   Oral   Take 30 mLs by mouth 2 (two) times daily.         [name] FERROUS SULFATE 325 (65 FE) MG PO TABS   Oral   Take 1 tablet (325 mg total) by mouth 3 (three) times daily with meals.      3   . FUROSEMIDE 40 MG PO TABS   Oral   Take 40 mg by mouth daily.         [name] HYDROCODONE-ACETAMINOPHEN 5-325 MG PO TABS   Oral   Take 1 tablet by mouth every 6 (six) hours as needed for pain.   30 tablet   0   . INSULIN ASPART 100 UNIT/ML [place] SOLN   Subcutaneous   Inject 0-10 Units into the skin 4 (four) times daily -  before meals and at bedtime. Per sliding scale: 0-150=0 units; 151-200=2 units;  201-250=4 units; 251-300=6 units; 301-350=8 units; >350=10 units; >450=CALL MD         . INSULIN GLARGINE 100 UNIT/ML [place] SOLN   Subcutaneous   Inject 20 Units into the skin at bedtime.          [name] LEVOTHYROXINE SODIUM 88 MCG PO TABS   Oral   Take 1 tablet (88 mcg total) by mouth daily before breakfast.         . LINAGLIPTIN 5 MG PO TABS   Oral   Take 5 mg by mouth daily.         [name] METOPROLOL TARTRATE 25 MG PO TABS   Oral   Take 25 mg by mouth 2 (two) times daily.         [name] ONDANSETRON 8 MG PO TBDP   Oral   Take 8 mg by mouth every 4 (four) hours as needed. 8mg  ODT q4 hours prn nausea         . SENNA 8.6 MG PO TABS   Oral   Take 2 tablets (17.2 mg total) by mouth at bedtime.   120 each      . SERTRALINE HCL 100 MG PO TABS   Oral   Take 100 mg by mouth daily.          . TRAVOPROST 0.004 % OP SOLN   Both Eyes   Place 1 drop into both eyes at bedtime.          [name] VITAMIN C 500 MG PO TABS   Oral   Take 1,000 mg by mouth 2 (two) times daily.           BP 147/79  Pulse 69  Temp 98.2 F (36.8 C) (Oral)  Resp 21  Ht 5\' 7"  (1.702 m)  Wt 150 lb (68.04 kg)  BMI 23.49 kg/m2  SpO2 94%  Physical Exam  Nursing note and vitals reviewed. Constitutional: She is oriented to person, place, and time. She appears well-developed and well-nourished. No distress.  HENT:  Head: Normocephalic and atraumatic.  Eyes: Conjunctivae normal and EOM are normal.  Cardiovascular: Normal rate and regular rhythm.   Pulmonary/Chest: Effort normal. No stridor. No respiratory distress. She has decreased breath sounds.  Abdominal: She exhibits no distension.  Musculoskeletal: She exhibits no edema.       Left AKA.  Right ankle is 2+ pitting edema, nontender.  Pulses are appreciable  Neurological: She is alert and oriented to person, place, and time. No cranial nerve deficit.  Skin: Skin is warm and dry.  Psychiatric: She has a normal mood and affect.    ED Course  BLADDER  CATHETERIZATION Date/Time: 09/24/2012 7:35 PM Performed by: Gerhard Munch Authorized by: Gerhard Munch Consent: Verbal consent obtained. Risks and benefits: risks, benefits and alternatives were discussed Consent given by: patient Patient identity confirmed: verbally with patient Time out: Immediately prior to procedure a "time out" was called to verify the correct patient, procedure, equipment, support staff and site/side marked as required. Indications: urine output monitoring Local anesthesia used: no Patient sedated: no Preparation: Patient was prepped and draped in the usual sterile fashion. Catheter insertion: temporary indwelling Catheter type: Foley Catheter size: 14 Fr Complicated insertion: no Altered anatomy: no Bladder irrigation: no Number of attempts: 1 Aspirate amount (ml): see notes. Urine characteristics: yellow Patient tolerance: Patient tolerated the procedure well with no immediate complications.   (including critical care time)   Labs Reviewed  PRO B NATRIURETIC PEPTIDE  CBC  COMPREHENSIVE METABOLIC PANEL  PROTIME-INR  TROPONIN I  URINALYSIS, ROUTINE W REFLEX MICROSCOPIC   No results found.   No diagnosis found.   Cardiac 70 paced abnormal Pulse ox 99% room air normal    Date: 09/24/2012  Rate: 70  Rhythm: v-paced  QRS Axis: left  Intervals: v-paced  ST/T Wave abnormalities: normal  Conduction Disutrbances:v-paced  Narrative Interpretation:   Old EKG Reviewed: unchanged Abnormal   Update: The patient requests foley catheter placement as she is disabled - and increased urine production is expected.   8:27 PM The patient states that she is feeling better.  She is producing significant amounts of urine. Hemodynamically she remains similar.  We discussed return precautions, the need for PMD followup Monday.  MDM  This patient with multiple medical problems now presents with concerns of chest pain for the past day.  Her description  of concurrent cough and shortness of breath, as well as her history there suspicion of CHF exacerbation.  Given the patient's endorsement of symptoms for greater than one day, a negative troponin likely were presents the absence of acute ongoing coronary ischemia.  The patient's ECG is also unchanged.  The patient's BNP is significantly elevated.  She received nitroglycerin paste, Lasix bolus, and had clinical improvement.  She requested to go home, and given that her daughter is a Water engineer, and the patient has easy access to follow up care, this was accommodated.  Absent fever, leukocytosis, distress, there is low suspicion for concurrent occult infection.  Gerhard Munch, MD 09/24/12 2032

## 2012-09-24 NOTE — ED Notes (Signed)
Sob , heaviness in chest since last night.   No NVD ,  Cough

## 2012-09-26 LAB — URINE CULTURE

## 2012-09-27 ENCOUNTER — Telehealth: Payer: Self-pay | Admitting: Cardiology

## 2012-09-27 NOTE — Telephone Encounter (Signed)
Pt daughter informed of results.

## 2012-09-27 NOTE — Telephone Encounter (Signed)
Message copied by Burnice Logan on Mon Sep 27, 2012 11:40 AM ------      Message from: [place], [place] D      Created: Sun Sep 26, 2012 11:26 AM       Chemistry lab from January 15 looked quite good. No change in therapy

## 2012-10-03 ENCOUNTER — Emergency Department (HOSPITAL_COMMUNITY): Payer: Medicare Other

## 2012-10-03 ENCOUNTER — Encounter (HOSPITAL_COMMUNITY): Payer: Self-pay

## 2012-10-03 ENCOUNTER — Inpatient Hospital Stay (HOSPITAL_COMMUNITY)
Admission: EM | Admit: 2012-10-03 | Discharge: 2012-10-06 | DRG: 292 | Disposition: A | Discharge status: Home Health | Payer: Medicare Other | Attending: Emergency Medicine | Admitting: Emergency Medicine

## 2012-10-03 DIAGNOSIS — I509 Heart failure, unspecified: Secondary | ICD-10-CM | POA: Diagnosis present

## 2012-10-03 DIAGNOSIS — I739 Peripheral vascular disease, unspecified: Secondary | ICD-10-CM | POA: Diagnosis present

## 2012-10-03 DIAGNOSIS — E876 Hypokalemia: Secondary | ICD-10-CM | POA: Diagnosis present

## 2012-10-03 DIAGNOSIS — J9690 Respiratory failure, unspecified, unspecified whether with hypoxia or hypercapnia: Secondary | ICD-10-CM

## 2012-10-03 DIAGNOSIS — S78119A Complete traumatic amputation at level between unspecified hip and knee, initial encounter: Secondary | ICD-10-CM

## 2012-10-03 DIAGNOSIS — I129 Hypertensive chronic kidney disease with stage 1 through stage 4 chronic kidney disease, or unspecified chronic kidney disease: Secondary | ICD-10-CM | POA: Diagnosis present

## 2012-10-03 DIAGNOSIS — E118 Type 2 diabetes mellitus with unspecified complications: Secondary | ICD-10-CM

## 2012-10-03 DIAGNOSIS — N183 Chronic kidney disease, stage 3 unspecified: Secondary | ICD-10-CM | POA: Diagnosis present

## 2012-10-03 DIAGNOSIS — F410 Panic disorder [episodic paroxysmal anxiety] without agoraphobia: Secondary | ICD-10-CM | POA: Diagnosis present

## 2012-10-03 DIAGNOSIS — Z950 Presence of cardiac pacemaker: Secondary | ICD-10-CM

## 2012-10-03 DIAGNOSIS — D631 Anemia in chronic kidney disease: Secondary | ICD-10-CM | POA: Diagnosis present

## 2012-10-03 DIAGNOSIS — E162 Hypoglycemia, unspecified: Secondary | ICD-10-CM | POA: Diagnosis present

## 2012-10-03 DIAGNOSIS — F411 Generalized anxiety disorder: Secondary | ICD-10-CM | POA: Diagnosis present

## 2012-10-03 DIAGNOSIS — I2510 Atherosclerotic heart disease of native coronary artery without angina pectoris: Secondary | ICD-10-CM | POA: Diagnosis present

## 2012-10-03 DIAGNOSIS — N039 Chronic nephritic syndrome with unspecified morphologic changes: Secondary | ICD-10-CM | POA: Diagnosis present

## 2012-10-03 DIAGNOSIS — I4891 Unspecified atrial fibrillation: Secondary | ICD-10-CM | POA: Diagnosis present

## 2012-10-03 DIAGNOSIS — E1169 Type 2 diabetes mellitus with other specified complication: Secondary | ICD-10-CM | POA: Diagnosis present

## 2012-10-03 DIAGNOSIS — Z885 Allergy status to narcotic agent status: Secondary | ICD-10-CM

## 2012-10-03 DIAGNOSIS — E039 Hypothyroidism, unspecified: Secondary | ICD-10-CM | POA: Diagnosis present

## 2012-10-03 DIAGNOSIS — Z882 Allergy status to sulfonamides status: Secondary | ICD-10-CM

## 2012-10-03 DIAGNOSIS — Z90710 Acquired absence of both cervix and uterus: Secondary | ICD-10-CM

## 2012-10-03 DIAGNOSIS — K219 Gastro-esophageal reflux disease without esophagitis: Secondary | ICD-10-CM | POA: Diagnosis present

## 2012-10-03 DIAGNOSIS — N189 Chronic kidney disease, unspecified: Secondary | ICD-10-CM

## 2012-10-03 DIAGNOSIS — E875 Hyperkalemia: Secondary | ICD-10-CM

## 2012-10-03 DIAGNOSIS — N289 Disorder of kidney and ureter, unspecified: Secondary | ICD-10-CM

## 2012-10-03 DIAGNOSIS — Z794 Long term (current) use of insulin: Secondary | ICD-10-CM

## 2012-10-03 DIAGNOSIS — N179 Acute kidney failure, unspecified: Secondary | ICD-10-CM

## 2012-10-03 DIAGNOSIS — Z888 Allergy status to other drugs, medicaments and biological substances status: Secondary | ICD-10-CM

## 2012-10-03 DIAGNOSIS — E785 Hyperlipidemia, unspecified: Secondary | ICD-10-CM | POA: Diagnosis present

## 2012-10-03 DIAGNOSIS — Z8673 Personal history of transient ischemic attack (TIA), and cerebral infarction without residual deficits: Secondary | ICD-10-CM

## 2012-10-03 DIAGNOSIS — E119 Type 2 diabetes mellitus without complications: Secondary | ICD-10-CM

## 2012-10-03 DIAGNOSIS — I252 Old myocardial infarction: Secondary | ICD-10-CM

## 2012-10-03 DIAGNOSIS — I10 Essential (primary) hypertension: Secondary | ICD-10-CM

## 2012-10-03 DIAGNOSIS — Z79899 Other long term (current) drug therapy: Secondary | ICD-10-CM

## 2012-10-03 DIAGNOSIS — I442 Atrioventricular block, complete: Secondary | ICD-10-CM | POA: Diagnosis present

## 2012-10-03 DIAGNOSIS — K3184 Gastroparesis: Secondary | ICD-10-CM | POA: Diagnosis present

## 2012-10-03 DIAGNOSIS — I5033 Acute on chronic diastolic (congestive) heart failure: Principal | ICD-10-CM | POA: Insufficient documentation

## 2012-10-03 DIAGNOSIS — F0390 Unspecified dementia without behavioral disturbance: Secondary | ICD-10-CM | POA: Diagnosis present

## 2012-10-03 LAB — CBC WITH DIFFERENTIAL/PLATELET
Basophils Absolute: 0 10*3/uL (ref 0.0–0.1)
Eosinophils Relative: 3 % (ref 0–5)
HCT: 46 % (ref 36.0–46.0)
Hemoglobin: 15 g/dL (ref 12.0–15.0)
Lymphocytes Relative: 50 % — ABNORMAL HIGH (ref 12–46)
Lymphs Abs: 6.4 10*3/uL — ABNORMAL HIGH (ref 0.7–4.0)
MCV: 92.9 fL (ref 78.0–100.0)
Monocytes Absolute: 0.8 10*3/uL (ref 0.1–1.0)
Monocytes Relative: 7 % (ref 3–12)
Neutro Abs: 5.1 10*3/uL (ref 1.7–7.7)
RDW: 15.4 % (ref 11.5–15.5)
WBC: 12.7 10*3/uL — ABNORMAL HIGH (ref 4.0–10.5)

## 2012-10-03 LAB — COMPREHENSIVE METABOLIC PANEL
Albumin: 3.5 g/dL (ref 3.5–5.2)
Alkaline Phosphatase: 97 U/L (ref 39–117)
BUN: 21 mg/dL (ref 6–23)
CO2: 31 mEq/L (ref 19–32)
Chloride: 98 mEq/L (ref 96–112)
Creatinine, Ser: 0.96 mg/dL (ref 0.50–1.10)
GFR calc non Af Amer: 55 mL/min — ABNORMAL LOW (ref >90)
Glucose, Bld: <45 mg/dL — CL (ref 70–99)
Potassium: 2.9 mEq/L — ABNORMAL LOW (ref 3.5–5.1)
Total Bilirubin: 0.7 mg/dL (ref 0.3–1.2)

## 2012-10-03 LAB — URINALYSIS, ROUTINE W REFLEX MICROSCOPIC
Bilirubin Urine: NEGATIVE
Ketones, ur: NEGATIVE mg/dL
Nitrite: NEGATIVE
Specific Gravity, Urine: 1.025 (ref 1.005–1.030)
Urobilinogen, UA: 0.2 mg/dL (ref 0.0–1.0)

## 2012-10-03 LAB — MAGNESIUM: Magnesium: 1.7 mg/dL (ref 1.5–2.5)

## 2012-10-03 LAB — PRO B NATRIURETIC PEPTIDE: Pro B Natriuretic peptide (BNP): 8067 pg/mL — ABNORMAL HIGH (ref 0–450)

## 2012-10-03 LAB — GLUCOSE, CAPILLARY: Glucose-Capillary: 108 mg/dL — ABNORMAL HIGH (ref 70–99)

## 2012-10-03 MED ORDER — DEXTROSE 50 % IV SOLN
25.0000 g | Freq: Once | INTRAVENOUS | Status: AC
  Administered 2012-10-03: 50 mL via INTRAVENOUS

## 2012-10-03 MED ORDER — ONDANSETRON HCL 4 MG/2ML IJ SOLN: 4.0000 mg | INTRAMUSCULAR | Status: DC | PRN

## 2012-10-03 MED ORDER — SODIUM CHLORIDE 0.9 % IV SOLN: 250.0000 mL | INTRAVENOUS | Status: DC | PRN

## 2012-10-03 MED ORDER — ENOXAPARIN SODIUM 30 MG/0.3ML ~~LOC~~ SOLN
30.0000 mg | SUBCUTANEOUS | Status: DC
  Administered 2012-10-04 – 2012-10-05 (×2): 30 mg via SUBCUTANEOUS
  Filled 2012-10-03 (×2): qty 0.3

## 2012-10-03 MED ORDER — POTASSIUM CHLORIDE CRYS ER 20 MEQ PO TBCR
40.0000 meq | EXTENDED_RELEASE_TABLET | ORAL | Status: AC
  Administered 2012-10-03: 40 meq via ORAL
  Filled 2012-10-03: qty 2

## 2012-10-03 MED ORDER — LEVOTHYROXINE SODIUM 88 MCG PO TABS
88.0000 ug | ORAL_TABLET | Freq: Every day | ORAL | Status: DC
  Administered 2012-10-04 – 2012-10-05 (×2): 88 ug via ORAL
  Filled 2012-10-03 (×4): qty 1

## 2012-10-03 MED ORDER — POTASSIUM CHLORIDE CRYS ER 20 MEQ PO TBCR
40.0000 meq | EXTENDED_RELEASE_TABLET | ORAL | Status: AC
  Administered 2012-10-03 – 2012-10-04 (×3): 40 meq via ORAL
  Filled 2012-10-03 (×3): qty 2

## 2012-10-03 MED ORDER — TRAVOPROST (BAK FREE) 0.004 % OP SOLN
1.0000 [drp] | Freq: Every day | OPHTHALMIC | Status: DC
  Administered 2012-10-04 – 2012-10-05 (×2): 1 [drp] via OPHTHALMIC
  Filled 2012-10-03: qty 2.5

## 2012-10-03 MED ORDER — INSULIN ASPART 100 UNIT/ML ~~LOC~~ SOLN
0.0000 [IU] | Freq: Three times a day (TID) | SUBCUTANEOUS | Status: DC
  Administered 2012-10-04: 2 [IU] via SUBCUTANEOUS
  Administered 2012-10-04: 1 [IU] via SUBCUTANEOUS
  Administered 2012-10-05: 5 [IU] via SUBCUTANEOUS
  Administered 2012-10-05: 2 [IU] via SUBCUTANEOUS
  Administered 2012-10-05: 3 [IU] via SUBCUTANEOUS
  Administered 2012-10-06 (×2): 5 [IU] via SUBCUTANEOUS

## 2012-10-03 MED ORDER — LISINOPRIL 5 MG PO TABS
5.0000 mg | ORAL_TABLET | Freq: Every day | ORAL | Status: DC
  Administered 2012-10-03 – 2012-10-05 (×3): 5 mg via ORAL
  Filled 2012-10-03 (×3): qty 1

## 2012-10-03 MED ORDER — SERTRALINE HCL 50 MG PO TABS
100.0000 mg | ORAL_TABLET | Freq: Every day | ORAL | Status: DC
  Administered 2012-10-04 – 2012-10-06 (×3): 100 mg via ORAL
  Filled 2012-10-03 (×3): qty 2

## 2012-10-03 MED ORDER — ACETAMINOPHEN 500 MG PO TABS: 500.0000 mg | ORAL_TABLET | Freq: Four times a day (QID) | ORAL | Status: DC | PRN

## 2012-10-03 MED ORDER — POTASSIUM CHLORIDE 10 MEQ/100ML IV SOLN
10.0000 meq | INTRAVENOUS | Status: AC
  Administered 2012-10-03: 10 meq via INTRAVENOUS
  Filled 2012-10-03: qty 100

## 2012-10-03 MED ORDER — SODIUM CHLORIDE 0.9 % IJ SOLN
3.0000 mL | Freq: Two times a day (BID) | INTRAMUSCULAR | Status: DC
  Administered 2012-10-04 – 2012-10-05 (×3): 3 mL via INTRAVENOUS

## 2012-10-03 MED ORDER — DOCUSATE SODIUM 100 MG PO CAPS
100.0000 mg | ORAL_CAPSULE | Freq: Two times a day (BID) | ORAL | Status: DC
  Administered 2012-10-03 – 2012-10-06 (×3): 100 mg via ORAL
  Filled 2012-10-03 (×6): qty 1

## 2012-10-03 MED ORDER — FUROSEMIDE 10 MG/ML IJ SOLN
40.0000 mg | Freq: Two times a day (BID) | INTRAMUSCULAR | Status: DC
  Administered 2012-10-04 – 2012-10-06 (×5): 40 mg via INTRAVENOUS
  Filled 2012-10-03 (×5): qty 4

## 2012-10-03 MED ORDER — POTASSIUM CHLORIDE CRYS ER 20 MEQ PO TBCR
40.0000 meq | EXTENDED_RELEASE_TABLET | Freq: Two times a day (BID) | ORAL | Status: DC
  Administered 2012-10-04 (×2): 40 meq via ORAL
  Filled 2012-10-03 (×2): qty 1
  Filled 2012-10-03: qty 2

## 2012-10-03 MED ORDER — METOPROLOL TARTRATE 25 MG PO TABS
25.0000 mg | ORAL_TABLET | Freq: Two times a day (BID) | ORAL | Status: DC
  Administered 2012-10-03 – 2012-10-06 (×6): 25 mg via ORAL
  Filled 2012-10-03 (×6): qty 1

## 2012-10-03 MED ORDER — SODIUM CHLORIDE 0.9 % IJ SOLN: 3.0000 mL | INTRAMUSCULAR | Status: DC | PRN

## 2012-10-03 MED ORDER — POTASSIUM CHLORIDE CRYS ER 20 MEQ PO TBCR
40.0000 meq | EXTENDED_RELEASE_TABLET | Freq: Once | ORAL | Status: AC
  Administered 2012-10-03: 40 meq via ORAL
  Filled 2012-10-03: qty 2

## 2012-10-03 MED ORDER — DEXTROSE 50 % IV SOLN
INTRAVENOUS | Status: AC
  Administered 2012-10-03: 50 mL via INTRAVENOUS
  Filled 2012-10-03: qty 50

## 2012-10-03 MED ORDER — HYDROCODONE-ACETAMINOPHEN 5-325 MG PO TABS: 1.0000 | ORAL_TABLET | Freq: Four times a day (QID) | ORAL | Status: DC | PRN

## 2012-10-03 MED ORDER — FUROSEMIDE 10 MG/ML IJ SOLN
80.0000 mg | Freq: Once | INTRAMUSCULAR | Status: AC
  Administered 2012-10-03: 80 mg via INTRAVENOUS
  Filled 2012-10-03: qty 8

## 2012-10-03 MED ORDER — ASPIRIN EC 81 MG PO TBEC
81.0000 mg | DELAYED_RELEASE_TABLET | Freq: Every day | ORAL | Status: DC
  Administered 2012-10-04 – 2012-10-06 (×3): 81 mg via ORAL
  Filled 2012-10-03 (×3): qty 1

## 2012-10-03 NOTE — ED Notes (Addendum)
Lab called critical glucose of <45 MD aware

## 2012-10-03 NOTE — H&P (Signed)
[facility] History and Physical  Brittney [name]  XBJ:[id number]  DOB: 1931/10/29   DOA: 10/03/2012   PCP:   Toma Deiters, MD   Chief Complaint:  Progressive shortness of breath for 2 days  HPI: Brittney [name] is an 79 y.o. female.  Elderly Caucasian lady with Diabetes hypertension and chronic diastolic heart failure, multiple other medical problems listed below; was seen in the emergency room about 9 days ago for CHF exacerbation, and discharged home with a temporary three-day increase in her Lasix dose, and did improve, but now has sudden worsening of dyspnea at rest and and with slight exertion. In the emergency room she has had intravenous continues to be short of breath and the hospitalist service was called to assist with management.   Patient was also found to be hypoglycemic , <45, on serum chemistry in the emergency room today. Daughter says this is the second episode of measured hypoglycemia this week, without symptoms or change in mental status. Her glucose was 39 earlier this week at home.  She was discharged from a nursing home one month ago, back to live with her daughter who is a Psychologist, sport and exercise in our hospital and accompanies her on this visit; her diet is somewhat change, it is unclear to what degree she is compliant with salt  and fluid restriction  Her ACE inhibitor was reportedly discontinued after her recent surgery 3 months ago and has not been restarted;  Rewiew of Systems:   All systems negative except as marked bold or noted in the HPI;  Constitutional:    malaise, fever and chills. ;  Eyes:   eye pain, redness and discharge. ;  ENMT:   Hard of hearing ear pain, hoarseness, nasal congestion, sinus pressure and sore throat. ;  Cardiovascular:    chest tightness, palpitations, diaphoresis, orthopnea, DOE and peripheral edema.  Respiratory:   cough, hemoptysis, wheezing and stridor. ;  Gastrointestinal:  nausea, vomiting, diarrhea, constipation, abdominal pain,  melena, blood in stool, hematemesis, jaundice and rectal bleeding. unusual weight loss..  10 pounds weight gain Genitourinary:    frequency, dysuria, incontinence,flank pain and hematuria; Musculoskeletal:   back pain and neck pain.  swelling and trauma.; Right transmetatarsal amputation; left AKA 3 months ago Skin: .  pruritus, rash, abrasions, bruising and skin lesion.; ulcerations Neuro:    headache, lightheadedness and neck stiffness.  weakness, altered level of consciousness, altered mental status, extremity weakness, burning feet, involuntary movement, seizure and syncope.  Psych:    anxiety, depression, insomnia, tearfulness, panic attacks, hallucinations, paranoia, suicidal or homicidal ideation   Past Medical History  Diagnosis Date  . Hypertension   . Dyslipidemia   . CAD (coronary artery disease)     Non-STEMI August, 2010,,,, 40% left main, 30% LAD, 60% OM 2, 40% RCA, 90% OM1-culprit lesion-too small for intervention           . Ejection fraction     EF 55% ,2011 / EF 30% with tachycardia cardiomyopathy / EF 50% January, 2012  . Diabetes mellitus   . Dementia     ?? Early dementia ?? 2012  . GERD (gastroesophageal reflux disease)   . TIA (transient ischemic attack)     History of TIAs  . Hypothyroidism   . PAD (peripheral artery disease)     Carotid endarterectomy, right. Stents right peroneal, right superficial, right popliteal arteries, Dr. Edilia Bo  . Amputation     Right midfoot 2010  . Gait disorder   . Vertebrobasilar insufficiency   .  Depression   . Hyperkalemia     ACE Inhibitor held  . Blindness of right eye   . Respiratory failure     Hypoxic and hypercapnic, 2011, etiology pneumonia  . Orthostatic hypotension     Syncope January 2012 with facial trauma, amiodarone stopped  . Atrial fibrillation 8/10    on pradaxa, s/p AV nodal ablation  . Ventricular tachycardia     20 beats December, 2012, asymptomatic  . Atrial flutter 05/24/09    Hospitalization in  [place]; with difficult rate control; TEE cardioversion done EF 30%, secondary to tachycardia, pt did convert back tosinus rhythm; return 10/11, amiodarone restarted 10/11, no longer on it. Recurrence with RVR 08/2011.  [name] Anemia in CKD (chronic kidney disease)   . Gastroparesis 3/11    Abnormal GES  . Drug therapy,  Sotolol     Sotolol started 08/2011.  . Drug therapy     Pradaxa January, 2013  . Complete heart block     s/p AV nodal ablation  . Chronic diastolic CHF (congestive heart failure)     echo 03/2012- LVEF 55-60%, grade 2 diastolic dysfunction, mild LA dilatation, mild MR, paradoxical vent septal motion  . On home O2     prn  . PONV (postoperative nausea and vomiting)   . S/P AKA (above knee amputation) unilateral     Left AKA October, 2013  . CKD (chronic kidney disease)     Creatinine 1.19 at December 2012 discharge  . Acute renal insufficiency     Catheterization August 2010    Past Surgical History  Procedure Date  . Total abdominal hysterectomy   . Cholecystectomy   . Carotid endarterectomy   . Amputation 2010    RIGHT MIDFOOT  . Tonsillectomy   . Eye surgeries     Several right  . Pacemaker insertion     SJM Pacemaker implant 9/10  . Pci stent to the right peroneal and right superficial as well as right popliteal arteries     Dr. Edilia Bo  . Av nodal ablation 08/2011    by Dr Ladona Ridgel  . I&d extremity 05/28/2012    Procedure: IRRIGATION AND DEBRIDEMENT EXTREMITY;  Surgeon: Sherren Kerns, MD;  Location: [facility] OR;  Service: Vascular;  Laterality: Left;  possible amputation toes  . Amputation 07/02/2012    Procedure: AMPUTATION ABOVE KNEE;  Surgeon: Nadara Mustard, MD;  Location: [facility] OR;  Service: Orthopedics;  Laterality: Left;  Left Above Knee Amputation    Medications:  HOME MEDS: Prior to Admission medications   Medication Sig Start Date End Date Taking? Authorizing Provider  acetaminophen (TYLENOL) 500 MG tablet Take 1,000 mg by mouth 3 (three) times  daily as needed. For pain   Yes Historical Provider, MD  docusate sodium (COLACE) 100 MG capsule Take 100 mg by mouth 2 (two) times daily.   Yes Historical Provider, MD  insulin aspart (NOVOLOG) 100 UNIT/ML injection Inject 0-10 Units into the skin 4 (four) times daily -  before meals and at bedtime. Per sliding scale: 0-150=0 units; 151-200=2 units; 201-250=4 units; 251-300=6 units; 301-350=8 units; >350=10 units; >450=CALL MD   Yes Historical Provider, MD  insulin glargine (LANTUS) 100 UNIT/ML injection Inject 20 Units into the skin at bedtime.    Yes Historical Provider, MD  levothyroxine (SYNTHROID, LEVOTHROID) 88 MCG tablet Take 1 tablet (88 mcg total) by mouth daily before breakfast. 06/08/12  Yes Leroy Sea, MD  linagliptin (TRADJENTA) 5 MG TABS tablet Take 5 mg by mouth  at bedtime.    Yes Historical Provider, MD  metoprolol tartrate (LOPRESSOR) 25 MG tablet Take 25 mg by mouth 2 (two) times daily.   Yes Historical Provider, MD  sertraline (ZOLOFT) 100 MG tablet Take 100 mg by mouth daily.    Yes Historical Provider, MD  travoprost, benzalkonium, (TRAVATAN) 0.004 % ophthalmic solution Place 1 drop into both eyes at bedtime.    Yes Historical Provider, MD  vitamin C (ASCORBIC ACID) 500 MG tablet Take 1,000 mg by mouth 2 (two) times daily.   Yes Historical Provider, MD  HYDROcodone-acetaminophen (NORCO) 5-325 MG per tablet Take 1 tablet by mouth every 6 (six) hours as needed for pain. 07/05/12   Nadara Mustard, MD  ondansetron (ZOFRAN-ODT) 8 MG disintegrating tablet Take 8 mg by mouth every 4 (four) hours as needed. 8mg  ODT q4 hours prn nausea 06/26/12   Hurman Horn, MD     Allergies:  Allergies  Allergen Reactions  . Amiodarone     Autonomic dysfunction  . Morphine Sulfate     REACTION: jerking  . Oxycodone Hcl Itching and Other (See Comments)    Feels weird  . Sulfa Antibiotics Other (See Comments)    unknown    Social History:   reports that she has never smoked. She has  never used smokeless tobacco. She reports that she does not drink alcohol or use illicit drugs.  Family History: Family History  Problem Relation Age of Onset  . Cancer Brother     Colon cancer  . Aortic aneurysm Mother   . Colon polyps Neg Hx   . Liver disease Neg Hx     And no CRC     Physical Exam: Filed Vitals:   10/03/12 1744 10/03/12 1903 10/03/12 2000 10/03/12 2159  BP:  155/70 158/71 161/82  Pulse:  70 70 73  Temp:    98 F (36.7 C)  TempSrc:    Oral  Resp:  20 23 18   Height:    5\' 7"  (1.702 m)  Weight:    73.301 kg (161 lb 9.6 oz)  SpO2: 94% 99% 94% 94%   Blood pressure 161/82, pulse 73, temperature 98 F (36.7 C), temperature source Oral, resp. rate 18, height 5\' 7"  (1.702 m), weight 73.301 kg (161 lb 9.6 oz), SpO2 94.00%.  GEN:  Pleasant elderly Caucasian lady reclining in the stretcher; gasping when she talk; but in no distress at rest distress; cannot hear me when I speak, but answers daughter's questions appropriately; cooperative with exam PSYCH:  alert and oriented ; does not appear anxious or depressed; affect is appropriate. HEENT: Mucous membranes pink and anicteric; PERRLA; EOM intact; no cervical lymphadenopathy nor thyromegaly or carotid bruit; ; Breasts:: Not examined CHEST WALL: No tenderness; pacemaker in left upper chest CHEST: Normal respiration, bibasilar crackles  HEART: Regular rate and rhythm; no murmurs rubs or gallops BACK: No kyphosis or scoliosis; no CVA tenderness; mild edema ABDOMEN: soft non-tender; no masses, no organomegaly, normal abdominal bowel sounds;  no intertriginous candida. Rectal Exam: Not done EXTREMITIES: age-appropriate arthropathy of the hands and knees; 2+edema right leg; right transmetatarsal amputation well healed; left AKA stilling bandages; no ulcerations. Genitalia: not examined PULSES: 2+ and symmetric SKIN: Normal hydration no rash or ulceration CNS: Cranial nerves 2-12 grossly intact no focal lateralizing  neurologic deficit   Labs on Admission:  Basic Metabolic Panel:  Lab 10/03/12 1610  NA 138  K 2.9*  CL 98  CO2 31  GLUCOSE <45*  BUN  21  CREATININE 0.96  CALCIUM 9.9  MG --  PHOS --   Liver Function Tests:  Lab 10/03/12 1752  AST 23  ALT 13  ALKPHOS 97  BILITOT 0.7  PROT 8.1  ALBUMIN 3.5   No results found for this basename: LIPASE:5,AMYLASE:5 in the last 168 hours No results found for this basename: AMMONIA:5 in the last 168 hours CBC:  Lab 10/03/12 1752  WBC 12.7*  NEUTROABS 5.1  HGB 15.0  HCT 46.0  MCV 92.9  PLT 235   Cardiac Enzymes:  Lab 10/03/12 1752  CKTOTAL --  CKMB --  CKMBINDEX --  TROPONINI <0.30   BNP: No components found with this basename: POCBNP:5 D-dimer: No components found with this basename: D-DIMER:5 CBG:  Lab 10/03/12 2002  GLUCAP 108*    Radiological Exams on Admission: Dg Chest Port 1 View  10/03/2012  *RADIOLOGY REPORT*  Clinical Data: Chest pain.  PORTABLE CHEST - 1 VIEW  Comparison: PA and lateral chest 09/24/2012 and 09/23/2011.  Findings: Pacing device is in place.  There is cardiomegaly and pulmonary vascular congestion without frank edema.  No pneumothorax or pleural fluid.  IMPRESSION: Cardiomegaly and pulmonary vascular congestion.   Original Report Authenticated By: Holley Dexter, M.D.     EKG: Independently reviewed. Ventricular paced rhythm   Assessment/Plan Present on Admission:   . Acute on chronic diastolic congestive heart failure . Hypoglycemia . Complete heart block . Atrial fibrillation . CAD (coronary artery disease) . Dementia . Diabetes mellitus type 2 with complications . Hypokalemia . PVD (peripheral vascular disease)   PLAN: Admit this lady for intravenous Lasix, while repleating her potassium.   We will restart her ACE inhibitor now at a very low dose Will hold all antidiabetic medications for now, and mean a sensitive sliding scale to assess her insulin requirements  Other plans as  per orders.  Code Status: FULL CODE  Family Communication:  Disposition Plan:    Alexcis Bicking Nocturnist [facility] Pager [phone]   10/03/2012, 10:24 PM

## 2012-10-03 NOTE — ED Provider Notes (Signed)
History     CSN: [id number]  Arrival date & time 10/03/12  1734   First MD Initiated Contact with Patient 10/03/12 1841      Chief Complaint  Patient presents with  . Chest Pain    (Consider location/radiation/quality/duration/timing/severity/associated sxs/prior treatment) HPI Level 5 caveat due to dementia and hard of hearing. Daughter at the bedside provides history. Patient has had several days of increasing dyspnea, worse with lying flat. She has had some increased LE edema and possible weight gain at home. She was in the ED for similar symptoms recently and had increased lasix dose for a few days which seemed to help. She has had mild chest pressure, but no true chest pains. No fever, no cough. She has also had episodes of low blood sugar at home recently.   Past Medical History  Diagnosis Date  . Hypertension   . Dyslipidemia   . CAD (coronary artery disease)     Non-STEMI August, 2010,,,, 40% left main, 30% LAD, 60% OM 2, 40% RCA, 90% OM1-culprit lesion-too small for intervention           . Ejection fraction     EF 55% ,2011 / EF 30% with tachycardia cardiomyopathy / EF 50% January, 2012  . Diabetes mellitus   . Dementia     ?? Early dementia ?? 2012  . GERD (gastroesophageal reflux disease)   . TIA (transient ischemic attack)     History of TIAs  . Hypothyroidism   . PAD (peripheral artery disease)     Carotid endarterectomy, right. Stents right peroneal, right superficial, right popliteal arteries, Dr. Edilia Bo  . Amputation     Right midfoot 2010  . Gait disorder   . Vertebrobasilar insufficiency   . Depression   . Hyperkalemia     ACE Inhibitor held  . Blindness of right eye   . Respiratory failure     Hypoxic and hypercapnic, 2011, etiology pneumonia  . Orthostatic hypotension     Syncope January 2012 with facial trauma, amiodarone stopped  . Atrial fibrillation 8/10    on pradaxa, s/p AV nodal ablation  . Ventricular tachycardia     20 beats December,  2012, asymptomatic  . Atrial flutter 05/24/09    Hospitalization in [place]; with difficult rate control; TEE cardioversion done EF 30%, secondary to tachycardia, pt did convert back tosinus rhythm; return 10/11, amiodarone restarted 10/11, no longer on it. Recurrence with RVR 08/2011.  [name] Anemia in CKD (chronic kidney disease)   . Gastroparesis 3/11    Abnormal GES  . Drug therapy,  Sotolol     Sotolol started 08/2011.  . Drug therapy     Pradaxa January, 2013  . Complete heart block     s/p AV nodal ablation  . Chronic diastolic CHF (congestive heart failure)     echo 03/2012- LVEF 55-60%, grade 2 diastolic dysfunction, mild LA dilatation, mild MR, paradoxical vent septal motion  . On home O2     prn  . PONV (postoperative nausea and vomiting)   . S/P AKA (above knee amputation) unilateral     Left AKA October, 2013  . CKD (chronic kidney disease)     Creatinine 1.19 at December 2012 discharge  . Acute renal insufficiency     Catheterization August 2010    Past Surgical History  Procedure Date  . Total abdominal hysterectomy   . Cholecystectomy   . Carotid endarterectomy   . Amputation 2010    RIGHT MIDFOOT  .  Tonsillectomy   . Eye surgeries     Several right  . Pacemaker insertion     SJM Pacemaker implant 9/10  . Pci stent to the right peroneal and right superficial as well as right popliteal arteries     Dr. Edilia Bo  . Av nodal ablation 08/2011    by Dr Ladona Ridgel  . I&d extremity 05/28/2012    Procedure: IRRIGATION AND DEBRIDEMENT EXTREMITY;  Surgeon: Sherren Kerns, MD;  Location: [facility] OR;  Service: Vascular;  Laterality: Left;  possible amputation toes  . Amputation 07/02/2012    Procedure: AMPUTATION ABOVE KNEE;  Surgeon: Nadara Mustard, MD;  Location: [facility] OR;  Service: Orthopedics;  Laterality: Left;  Left Above Knee Amputation    Family History  Problem Relation Age of Onset  . Cancer Brother     Colon cancer  . Aortic aneurysm Mother   . Colon polyps Neg Hx     . Liver disease Neg Hx     And no CRC    History  Substance Use Topics  . Smoking status: Never Smoker   . Smokeless tobacco: Never Used  . Alcohol Use: No    OB History    Grav Para Term Preterm Abortions TAB SAB Ect Mult Living                  Review of Systems All other systems reviewed and are negative except as noted in HPI.   Allergies  Amiodarone; Morphine sulfate; Oxycodone hcl; and Sulfa antibiotics  Home Medications   Current Outpatient Rx  Name  Route  Sig  Dispense  Refill  . ACETAMINOPHEN 500 MG PO TABS   Oral   Take 1,000 mg by mouth 3 (three) times daily as needed. For pain         . DOCUSATE SODIUM 100 MG PO CAPS   Oral   Take 100 mg by mouth 2 (two) times daily.         . INSULIN ASPART 100 UNIT/ML [place] SOLN   Subcutaneous   Inject 0-10 Units into the skin 4 (four) times daily -  before meals and at bedtime. Per sliding scale: 0-150=0 units; 151-200=2 units; 201-250=4 units; 251-300=6 units; 301-350=8 units; >350=10 units; >450=CALL MD         . INSULIN GLARGINE 100 UNIT/ML [place] SOLN   Subcutaneous   Inject 20 Units into the skin at bedtime.          [name] LEVOTHYROXINE SODIUM 88 MCG PO TABS   Oral   Take 1 tablet (88 mcg total) by mouth daily before breakfast.         . LINAGLIPTIN 5 MG PO TABS   Oral   Take 5 mg by mouth at bedtime.          [name] METOPROLOL TARTRATE 25 MG PO TABS   Oral   Take 25 mg by mouth 2 (two) times daily.         . SERTRALINE HCL 100 MG PO TABS   Oral   Take 100 mg by mouth daily.          . TRAVOPROST 0.004 % OP SOLN   Both Eyes   Place 1 drop into both eyes at bedtime.          [name] VITAMIN C 500 MG PO TABS   Oral   Take 1,000 mg by mouth 2 (two) times daily.         [name] HYDROCODONE-ACETAMINOPHEN 5-325 MG  PO TABS   Oral   Take 1 tablet by mouth every 6 (six) hours as needed for pain.   30 tablet   0   . ONDANSETRON 8 MG PO TBDP   Oral   Take 8 mg by mouth every 4 (four) hours as needed. 8mg   ODT q4 hours prn nausea           BP 159/74  Pulse 70  Temp 98.1 F (36.7 C) (Oral)  Resp 18  Ht 5\' 7"  (1.702 m)  Wt 134 lb (60.782 kg)  BMI 20.99 kg/m2  SpO2 94%  Physical Exam  Nursing note and vitals reviewed. Constitutional: She appears well-developed and well-nourished.  HENT:  Head: Normocephalic and atraumatic.  Eyes: EOM are normal. Pupils are equal, round, and reactive to light.  Neck: Normal range of motion. Neck supple.  Cardiovascular: Normal rate, normal heart sounds and intact distal pulses.   Pulmonary/Chest: Effort normal and breath sounds normal.  Abdominal: Bowel sounds are normal. She exhibits no distension. There is no tenderness.  Musculoskeletal: She exhibits edema. She exhibits no tenderness.       S/P L BKA, 2+ edema of RLE  Neurological: She is alert. She has normal strength. No cranial nerve deficit or sensory deficit.  Skin: Skin is warm and dry. No rash noted.  Psychiatric: She has a normal mood and affect.    ED Course  Procedures (including critical care time)  Labs Reviewed  CBC WITH DIFFERENTIAL - Abnormal; Notable for the following:    WBC 12.7 (*)     Neutrophils Relative 40 (*)     Lymphocytes Relative 50 (*)     Lymphs Abs 6.4 (*)     All other components within normal limits  COMPREHENSIVE METABOLIC PANEL - Abnormal; Notable for the following:    Potassium 2.9 (*)     Glucose, Bld <45 (*)     GFR calc non Af Amer 55 (*)     GFR calc Af Amer 64 (*)     All other components within normal limits  PRO B NATRIURETIC PEPTIDE - Abnormal; Notable for the following:    Pro B Natriuretic peptide (BNP) 8067.0 (*)     All other components within normal limits  GLUCOSE, CAPILLARY - Abnormal; Notable for the following:    Glucose-Capillary 108 (*)     All other components within normal limits  TROPONIN I  URINALYSIS, ROUTINE W REFLEX MICROSCOPIC  URINE CULTURE   Dg Chest Port 1 View  10/03/2012  *RADIOLOGY REPORT*  Clinical Data:  Chest pain.  PORTABLE CHEST - 1 VIEW  Comparison: PA and lateral chest 09/24/2012 and 09/23/2011.  Findings: Pacing device is in place.  There is cardiomegaly and pulmonary vascular congestion without frank edema.  No pneumothorax or pleural fluid.  IMPRESSION: Cardiomegaly and pulmonary vascular congestion.   Original Report Authenticated By: Holley Dexter, M.D.      No diagnosis found.    MDM   Date: 10/03/2012  Rate: 70  Rhythm: Ventricular paced  QRS Axis: indeterminate  Intervals: paced  ST/T Wave abnormalities: indeterminate  Conduction Disutrbances:paced  Narrative Interpretation:   Old EKG Reviewed: unchanged   8:28 PM Pt with labs and imaging as above, concerning for CHF exacerbation. She had recent attempt at outpatient management which did not work. Will admit for diuresis. Repletion of K and diabetes medication adjustments for hypoglycemia. Dr. Orvan Falconer to admit.        Bennetta Rudden B. Bernette Mayers, MD 10/03/12 2028

## 2012-10-03 NOTE — ED Notes (Signed)
RN at bedside

## 2012-10-03 NOTE — ED Notes (Signed)
Pt with complaints of chest pain that started the previous day and feels like she can not catch her breath.

## 2012-10-04 DIAGNOSIS — N179 Acute kidney failure, unspecified: Secondary | ICD-10-CM

## 2012-10-04 DIAGNOSIS — Z950 Presence of cardiac pacemaker: Secondary | ICD-10-CM

## 2012-10-04 DIAGNOSIS — J9600 Acute respiratory failure, unspecified whether with hypoxia or hypercapnia: Secondary | ICD-10-CM

## 2012-10-04 DIAGNOSIS — N189 Chronic kidney disease, unspecified: Secondary | ICD-10-CM

## 2012-10-04 DIAGNOSIS — I059 Rheumatic mitral valve disease, unspecified: Secondary | ICD-10-CM

## 2012-10-04 LAB — MRSA PCR SCREENING: MRSA by PCR: NEGATIVE

## 2012-10-04 LAB — GLUCOSE, CAPILLARY
Glucose-Capillary: 142 mg/dL — ABNORMAL HIGH (ref 70–99)
Glucose-Capillary: 51 mg/dL — ABNORMAL LOW (ref 70–99)

## 2012-10-04 LAB — BASIC METABOLIC PANEL
BUN: 21 mg/dL (ref 6–23)
Calcium: 9.5 mg/dL (ref 8.4–10.5)
Creatinine, Ser: 1.02 mg/dL (ref 0.50–1.10)
GFR calc Af Amer: 59 mL/min — ABNORMAL LOW (ref >90)
GFR calc non Af Amer: 51 mL/min — ABNORMAL LOW (ref >90)
Glucose, Bld: 179 mg/dL — ABNORMAL HIGH (ref 70–99)

## 2012-10-04 LAB — CBC
Hemoglobin: 13.5 g/dL (ref 12.0–15.0)
MCHC: 32.1 g/dL (ref 30.0–36.0)
RBC: 4.5 MIL/uL (ref 3.87–5.11)
WBC: 11.2 10*3/uL — ABNORMAL HIGH (ref 4.0–10.5)

## 2012-10-04 MED ORDER — METRONIDAZOLE 500 MG PO TABS
500.0000 mg | ORAL_TABLET | Freq: Three times a day (TID) | ORAL | Status: DC
  Administered 2012-10-04 – 2012-10-05 (×4): 500 mg via ORAL
  Filled 2012-10-04 (×4): qty 1

## 2012-10-04 NOTE — Care Management Note (Signed)
    Page 1 of 1   10/06/2012     3:09:27 PM   CARE MANAGEMENT NOTE 10/06/2012  Patient:  Brittney [name], Brittney [name]   Account Number:  [id number]  Date Initiated:  10/04/2012  Documentation initiated by:  Rosemary Holms  Subjective/Objective Assessment:   Pt admitted from home where she lives iwth her daughter. Plans to return hom and would be agreeable to [facility] RN and the Heart Failure program with [facility]     Action/Plan:   Anticipated DC Date:  10/06/2012   Anticipated DC Plan:  HOME W HOME HEALTH SERVICES      DC Planning Services  CM consult      Choice offered to / List presented to:          [facility] arranged  HH-1 RN  HH-10 DISEASE MANAGEMENT      HH agency  [facility]   Status of service:  Completed, signed off Medicare Important Message given?  YES (If response is "NO", the following Medicare IM given date fields will be blank) Date Medicare IM given:  10/06/2012 Date Additional Medicare IM given:    Discharge Disposition:  HOME W HOME HEALTH SERVICES  Per UR Regulation:    If discussed at Long Length of Stay Meetings, dates discussed:    Comments:  10/06/12 Rosemary Holms RN BSN CM Pt did not require O2. [facility] notified of referral  10/04/12 Rosemary Holms RN BSN CM

## 2012-10-04 NOTE — Progress Notes (Signed)
UR Chart Review Completed  

## 2012-10-04 NOTE — Progress Notes (Signed)
[facility] PROGRESS NOTE  Brittney [name] WJX:[id number] DOB: [birth year] DOA: 10/03/2012 PCP: Toma Deiters, MD  Assessment/Plan: 1.Acute on chronic diastolic congestive heart failure: somewhat improved with IV lasix specifically 80mg  yesterday will continue with 40mg  IV q12 today. Pro BNP 8067 on admission.  Wt 70.1kg from 73.3kg on admission. Anticipate changing lasix to po tomorrow. May need a higher maintenance dose at discharge. Echo results pending. Pt sees Dr. Myrtis Ser, last visit 09/15/12. Continue BB  Active Problems: 2.Hypoglycemia: likely related to decreased po intake and on insulin. CBG range 108-154. Continue to hold home lantus and oral agents. Will use SSI as needed. HgA1c in process Monitor po intake.   3.CAD (coronary artery disease): stable. No chest pain. Hx pacer. Tele without event.  4. Atrial fibrillation: s/p AV nodal ablation. Tele without event. Continue home meds. Off coumadin since AKA in 8/13. Cards note indicates will need to be resumed when safe to do so.   5. Hypokalemia: resolved  6. PVD (peripheral vascular disease)s/p left AKA. stable    7.Diabetes mellitus type 2 with complications: see #2.  8. CKD III. Stable. Creatinine 1.02 will monitor closely  9. Diarrhea: just since admission. cdiff pending. WC trending downward. Afebrile. Continue flagyl day #1  10. HTN: Fair control. SBP range 156-169. Continue BB. Consider ACE. Per Dr Myrtis Ser note ACE held in past due to renal function. Will monitor closely  Code Status: full Family Communication:  Disposition Plan: home with daughter   Consultants:  none  Procedures:  none  Antibiotics:  Flagyl  HPI/Subjective: Lying in bed alert denies pain/discomfort. Reports feeling "alot better". VERY hard of hearing.   Objective: Filed Vitals:   10/03/12 2000 10/03/12 2159 10/04/12 0239 10/04/12 0549  BP: 158/71 161/82 156/79 169/78  Pulse: 70 73 69 70  Temp:  98 F (36.7 C) 98.5 F (36.9 C) 98.2 F  (36.8 C)  TempSrc:  Oral Oral Oral  Resp: 23 18 19 18   Height:  5\' 7"  (1.702 m)    Weight:  73.301 kg (161 lb 9.6 oz)  70.171 kg (154 lb 11.2 oz)  SpO2: 94% 94% 96% 95%    Intake/Output Summary (Last 24 hours) at 10/04/12 1011 Last data filed at 10/04/12 0556  Gross per 24 hour  Intake      0 ml  Output   2200 ml  Net  -2200 ml   Filed Weights   10/03/12 1742 10/03/12 2159 10/04/12 0549  Weight: 60.782 kg (134 lb) 73.301 kg (161 lb 9.6 oz) 70.171 kg (154 lb 11.2 oz)    Exam:   General:  Awake alert NAD  Cardiovascular: RRR No MGR 1-2+ right LEE  Respiratory: normal effort BS with fine crackles in bases.   Abdomen: soft +BS non-tender to palpation  Data Reviewed: Basic Metabolic Panel:  Lab 10/04/12 2130 10/03/12 1752  NA 140 138  K 4.8 2.9*  CL 102 98  CO2 28 31  GLUCOSE 179* <45*  BUN 21 21  CREATININE 1.02 0.96  CALCIUM 9.5 9.9  MG -- 1.7  PHOS -- --   Liver Function Tests:  Lab 10/03/12 1752  AST 23  ALT 13  ALKPHOS 97  BILITOT 0.7  PROT 8.1  ALBUMIN 3.5   No results found for this basename: LIPASE:5,AMYLASE:5 in the last 168 hours No results found for this basename: AMMONIA:5 in the last 168 hours CBC:  Lab 10/04/12 0752 10/03/12 1752  WBC 11.2* 12.7*  NEUTROABS -- 5.1  HGB 13.5 15.0  HCT 42.0 46.0  MCV 93.3 92.9  PLT 214 235   Cardiac Enzymes:  Lab 10/03/12 1752  CKTOTAL --  CKMB --  CKMBINDEX --  TROPONINI <0.30   BNP (last 3 results)  Basename 10/03/12 1752 09/24/12 1750 01/24/12 2134  PROBNP 8067.0* 6226.0* 3488.0*   CBG:  Lab 10/04/12 0723 10/03/12 2002  GLUCAP 154* 108*    Recent Results (from the past 240 hour(s))  URINE CULTURE     Status: Normal   Collection Time   09/24/12  7:10 PM      Component Value Range Status Comment   Specimen Description URINE, CATHETERIZED   Final    Special Requests NONE   Final    Culture  Setup Time 09/25/2012 20:56   Final    Colony Count NO GROWTH   Final    Culture NO GROWTH    Final    Report Status 09/26/2012 FINAL   Final   MRSA PCR SCREENING     Status: Normal   Collection Time   10/04/12  3:00 AM      Component Value Range Status Comment   MRSA by PCR NEGATIVE  NEGATIVE Final      Studies: Dg Chest Port 1 View  10/03/2012  *RADIOLOGY REPORT*  Clinical Data: Chest pain.  PORTABLE CHEST - 1 VIEW  Comparison: PA and lateral chest 09/24/2012 and 09/23/2011.  Findings: Pacing device is in place.  There is cardiomegaly and pulmonary vascular congestion without frank edema.  No pneumothorax or pleural fluid.  IMPRESSION: Cardiomegaly and pulmonary vascular congestion.   Original Report Authenticated By: Holley Dexter, M.D.     Scheduled Meds:   . aspirin EC  81 mg Oral Daily  . docusate sodium  100 mg Oral BID  . enoxaparin (LOVENOX) injection  30 mg Subcutaneous Q24H  . furosemide  40 mg Intravenous Q12H  . insulin aspart  0-9 Units Subcutaneous TID WC  . levothyroxine  88 mcg Oral QAC breakfast  . lisinopril  5 mg Oral Daily  . metoprolol tartrate  25 mg Oral BID  . metroNIDAZOLE  500 mg Oral Q8H  . potassium chloride  40 mEq Oral BID  . sertraline  100 mg Oral Daily  . sodium chloride  3 mL Intravenous Q12H  . Travoprost (BAK Free)  1 drop Both Eyes QHS   Continuous Infusions:       Time spent: 35 minutes    [facility] M  [facility]  If 8PM-8AM, please contact night-coverage at [email or web address], password [facility] 10/04/2012, 10:11 AM  LOS: 1 day     Attending note:  Patient seen and examined, database reviewed. Agree with note as above.  Will likely need to be in hospital another few days for diuresis.

## 2012-10-04 NOTE — Progress Notes (Signed)
*  PRELIMINARY RESULTS* Echocardiogram 2D Echocardiogram has been performed.  Brittney [name] 10/04/2012, 10:26 AM

## 2012-10-05 DIAGNOSIS — E119 Type 2 diabetes mellitus without complications: Secondary | ICD-10-CM

## 2012-10-05 DIAGNOSIS — E875 Hyperkalemia: Secondary | ICD-10-CM

## 2012-10-05 LAB — GLUCOSE, CAPILLARY: Glucose-Capillary: 215 mg/dL — ABNORMAL HIGH (ref 70–99)

## 2012-10-05 LAB — CBC
HCT: 44 % (ref 36.0–46.0)
MCV: 94.4 fL (ref 78.0–100.0)
RBC: 4.66 MIL/uL (ref 3.87–5.11)
WBC: 10.5 10*3/uL (ref 4.0–10.5)

## 2012-10-05 LAB — BASIC METABOLIC PANEL
BUN: 29 mg/dL — ABNORMAL HIGH (ref 6–23)
CO2: 28 mEq/L (ref 19–32)
Chloride: 99 mEq/L (ref 96–112)
GFR calc Af Amer: 56 mL/min — ABNORMAL LOW (ref >90)
GFR calc non Af Amer: 48 mL/min — ABNORMAL LOW (ref >90)
Potassium: 4.3 mEq/L (ref 3.5–5.1)
Sodium: 137 mEq/L (ref 135–145)
Sodium: 136 mEq/L (ref 135–145)

## 2012-10-05 LAB — URINE CULTURE: Culture: NO GROWTH

## 2012-10-05 MED ORDER — LEVOTHYROXINE SODIUM 100 MCG PO TABS
100.0000 ug | ORAL_TABLET | Freq: Every day | ORAL | Status: DC
  Administered 2012-10-06: 100 ug via ORAL
  Filled 2012-10-05: qty 1

## 2012-10-05 MED ORDER — SODIUM POLYSTYRENE SULFONATE 15 GM/60ML PO SUSP
30.0000 g | Freq: Once | ORAL | Status: AC
  Administered 2012-10-05: 30 g via ORAL
  Filled 2012-10-05: qty 60
  Filled 2012-10-05: qty 120

## 2012-10-05 MED ORDER — ENOXAPARIN SODIUM 40 MG/0.4ML ~~LOC~~ SOLN
40.0000 mg | SUBCUTANEOUS | Status: DC
  Administered 2012-10-06: 40 mg via SUBCUTANEOUS
  Filled 2012-10-05: qty 0.4

## 2012-10-05 NOTE — Progress Notes (Signed)
Inpatient Diabetes Program Recommendations  AACE/ADA: New Consensus Statement on Inpatient Glycemic Control (2013)  Target Ranges:  Prepandial:   less than 140 mg/dL      Peak postprandial:   less than 180 mg/dL (1-2 hours)      Critically ill patients:  140 - 180 mg/dL   Results for WING, GFELLER (MRN [id number]) as of 10/05/2012 10:54  Ref. Range 10/04/2012 16:14 10/04/2012 16:37 10/04/2012 20:53 10/05/2012 07:49  Glucose-Capillary Latest Range: 70-99 mg/dL 51 (L) 96 782 (H) 956 (H)  Results for JAMICA, WOODYARD (MRN [id number]) as of 10/05/2012 10:54  Ref. Range 10/04/2012 06:19 10/05/2012 05:21  Glucose Latest Range: 70-99 mg/dL 469 (H) 629 (H)    Inpatient Diabetes Program Recommendations Insulin - Basal: Please consider ordering Lantus as lower dose than home dose.  Recommend starting out with 1/2 of home dose which would be Lantus 10 units QHS and titrate accordingly to improve glycemic control.  Note: Patient has a history of diabetes and takes Novolog 0-10 units TID, Lantus 20 units QHS, and Tradjenta 5mg  QHS at home for diabetes management.  Patient was initally noted to be hypoglycemic in the ED on 1/26 and had a hypoglycemic episode yesterday as an inpatient.  Currently ordered to receive Novolog sensitive correction AC for inpatient glycemic control.  It appears that blood glucose is now trending up and fasting blood glucose this morning was 215 mg/dl.  May want to consider restarting patient on basal insulin.  Recommend starting out on 1/2 of home dose.  Therefore, recommend Lantus 10 units QHS and titrate accordingly to improve glycemic control.  Will continue to follow.  Thanks, Orlando Penner, RN, BSN, CCRN Diabetes Coordinator Inpatient Diabetes Program [phone]

## 2012-10-05 NOTE — Progress Notes (Signed)
[facility] PROGRESS NOTE  Brittney [name]:[id number] DOB: 02-19-1932 DOA: 10/03/2012 PCP: Toma Deiters, MD  Assessment/Plan: 1.Acute on chronic diastolic congestive heart failure: Continues to improve. Wt 63.6kg  73.3kg on admission. Volume status -4.8L this hospitalization. Will continue IV lasix one more day. Consider transitioning to po at 60mg  tomorrow. Echo results pending. Pt sees Dr. Myrtis Ser, last visit 09/15/12. Continue BB  Active Problems:   2.Hypoglycemia: likely related to decreased po intake and on insulin. Appetite improving.  CBG range 96-215. Continue to hold home lantus and oral agents and use SSI as needed. HgA1c  7.3 Monitor po intake.   3.CAD (coronary artery disease): stable. No chest pain. Hx pacer. Tele without event.   4. Atrial fibrillation: s/p AV nodal ablation. Tele without event. Continue home meds. Off coumadin since AKA in 8/13. Cards note indicates will need to be resumed when safe to do so.   5. Hyperkalemia: likely related to potassium supplements. Give kayexelate and monitor. Potassium discontinued.  6. PVD (peripheral vascular disease)s/p left AKA and right amputation of feet.  stable   7.Diabetes mellitus type 2 with complications: see #2.   8. CKD III. Stable. Creatinine trending upward today to 1.17. Most probably related to lasix. Anticipate return to baseline once IV lasix changed to po.  will monitor closely   9. Diarrhea: just since admission. cdiff neg. WC WNL. Afebrile. D/c flagyl   10. HTN: Fair control. SBP range 119-166. Continue BB. Not on ACE due to hx renal failure.    11. Hypothyroid: TSH 6.0. Chart review indicates TSH trending up since 9/13 when she was in hospital with complications s/p amputation left leg. Increase synthroid to daily and recheck thyroid function studies in 4 weeks.   Code Status: full Family Communication:  Disposition Plan: home with daughter when  ready.   Consultants:  none  Procedures:  none  Antibiotics:  Flagyl 10/03/12-10/05/12  HPI/Subjective: Sitting up in bed eating breakfast. Reports feeling better but not sleeping well.  Objective: Filed Vitals:   10/04/12 2139 10/05/12 0146 10/05/12 0500 10/05/12 0536  BP: 158/70   166/72  Pulse: 70   73  Temp: 99.2 F (37.3 C)   98.5 F (36.9 C)  TempSrc:      Resp: 16   16  Height:      Weight:   67.6 kg (149 lb 0.5 oz) 67.677 kg (149 lb 3.2 oz)  SpO2: 94% 94%  93%    Intake/Output Summary (Last 24 hours) at 10/05/12 0923 Last data filed at 10/05/12 0600  Gross per 24 hour  Intake    248 ml  Output   3175 ml  Net  -2927 ml   Filed Weights   10/04/12 0549 10/05/12 0500 10/05/12 0536  Weight: 70.171 kg (154 lb 11.2 oz) 67.6 kg (149 lb 0.5 oz) 67.677 kg (149 lb 3.2 oz)    Exam:   General:  Awake alert NAD. Very HOH   Cardiovascular: RRR no MGR 1+ RLE  Respiratory: normal effort improved air movement. BS distant but clear bilaterally  Abdomen: non-distended soft +BS non-tender to palpation  Data Reviewed: Basic Metabolic Panel:  Lab 10/05/12 8119 10/04/12 0619 10/03/12 1752  NA 137 140 138  K 5.7* 4.8 2.9*  CL 99 102 98  CO2 28 28 31   GLUCOSE 225* 179* <45*  BUN 29* 21 21  CREATININE 1.17* 1.02 0.96  CALCIUM 10.0 9.5 9.9  MG -- -- 1.7  PHOS -- -- --  Liver Function Tests:  Lab 10/03/12 1752  AST 23  ALT 13  ALKPHOS 97  BILITOT 0.7  PROT 8.1  ALBUMIN 3.5   No results found for this basename: LIPASE:5,AMYLASE:5 in the last 168 hours No results found for this basename: AMMONIA:5 in the last 168 hours CBC:  Lab 10/05/12 0521 10/04/12 0752 10/03/12 1752  WBC 10.5 11.2* 12.7*  NEUTROABS -- -- 5.1  HGB 13.6 13.5 15.0  HCT 44.0 42.0 46.0  MCV 94.4 93.3 92.9  PLT 208 214 235   Cardiac Enzymes:  Lab 10/03/12 1752  CKTOTAL --  CKMB --  CKMBINDEX --  TROPONINI <0.30   BNP (last 3 results)  Basename 10/03/12 1752 09/24/12 1750  01/24/12 2134  PROBNP 8067.0* 6226.0* 3488.0*   CBG:  Lab 10/05/12 0749 10/04/12 2053 10/04/12 1637 10/04/12 1614 10/04/12 1119  GLUCAP 215* 170* 96 51* 142*    Recent Results (from the past 240 hour(s))  MRSA PCR SCREENING     Status: Normal   Collection Time   10/04/12  3:00 AM      Component Value Range Status Comment   MRSA by PCR NEGATIVE  NEGATIVE Final   CLOSTRIDIUM DIFFICILE BY PCR     Status: Normal   Collection Time   10/04/12  6:02 AM      Component Value Range Status Comment   C difficile by pcr NEGATIVE  NEGATIVE Final      Studies: Dg Chest Port 1 View  10/03/2012  *RADIOLOGY REPORT*  Clinical Data: Chest pain.  PORTABLE CHEST - 1 VIEW  Comparison: PA and lateral chest 09/24/2012 and 09/23/2011.  Findings: Pacing device is in place.  There is cardiomegaly and pulmonary vascular congestion without frank edema.  No pneumothorax or pleural fluid.  IMPRESSION: Cardiomegaly and pulmonary vascular congestion.   Original Report Authenticated By: Holley Dexter, M.D.     Scheduled Meds:   . aspirin EC  81 mg Oral Daily  . docusate sodium  100 mg Oral BID  . enoxaparin (LOVENOX) injection  30 mg Subcutaneous Q24H  . furosemide  40 mg Intravenous Q12H  . insulin aspart  0-9 Units Subcutaneous TID WC  . levothyroxine  88 mcg Oral QAC breakfast  . lisinopril  5 mg Oral Daily  . metoprolol tartrate  25 mg Oral BID  . metroNIDAZOLE  500 mg Oral Q8H  . sertraline  100 mg Oral Daily  . sodium chloride  3 mL Intravenous Q12H  . Travoprost (BAK Free)  1 drop Both Eyes QHS   Continuous Infusions:   Principal Problem:  *Acute on chronic diastolic congestive heart failure Active Problems:  CAD (coronary artery disease)  Dementia  Atrial fibrillation  Complete heart block  Hypokalemia  PVD (peripheral vascular disease)  Hypoglycemia  Diabetes mellitus type 2 with complications    Time spent: 30 minutes    [facility] M  [facility]  If 8PM-8AM, please  contact night-coverage at [email or web address], password [employer] 10/05/2012, 9:23 AM  LOS: 2 days

## 2012-10-06 ENCOUNTER — Encounter: Payer: Self-pay | Admitting: Cardiology

## 2012-10-06 DIAGNOSIS — N289 Disorder of kidney and ureter, unspecified: Secondary | ICD-10-CM

## 2012-10-06 DIAGNOSIS — I10 Essential (primary) hypertension: Secondary | ICD-10-CM

## 2012-10-06 LAB — GLUCOSE, CAPILLARY
Glucose-Capillary: 289 mg/dL — ABNORMAL HIGH (ref 70–99)
Glucose-Capillary: 258 mg/dL — ABNORMAL HIGH (ref 70–99)

## 2012-10-06 LAB — BASIC METABOLIC PANEL
CO2: 35 mEq/L — ABNORMAL HIGH (ref 19–32)
Calcium: 9.3 mg/dL (ref 8.4–10.5)
Glucose, Bld: 254 mg/dL — ABNORMAL HIGH (ref 70–99)
Sodium: 137 mEq/L (ref 135–145)

## 2012-10-06 MED ORDER — INSULIN GLARGINE 100 UNIT/ML ~~LOC~~ SOLN: 10.0000 [IU] | Freq: Every day | SUBCUTANEOUS | Status: AC

## 2012-10-06 MED ORDER — ASPIRIN 81 MG PO TBEC: 81.0000 mg | DELAYED_RELEASE_TABLET | Freq: Every day | ORAL | Status: DC

## 2012-10-06 MED ORDER — POTASSIUM CHLORIDE ER 10 MEQ PO TBCR: 20.0000 meq | EXTENDED_RELEASE_TABLET | Freq: Two times a day (BID) | ORAL | Status: DC

## 2012-10-06 MED ORDER — FUROSEMIDE 40 MG PO TABS: ORAL_TABLET | ORAL | Status: DC

## 2012-10-06 MED ORDER — POTASSIUM CHLORIDE CRYS ER 20 MEQ PO TBCR
40.0000 meq | EXTENDED_RELEASE_TABLET | Freq: Once | ORAL | Status: AC
  Administered 2012-10-06: 40 meq via ORAL
  Filled 2012-10-06: qty 2

## 2012-10-06 NOTE — Progress Notes (Signed)
UR Chart Review Completed  

## 2012-10-06 NOTE — Progress Notes (Signed)
D/c instructions reviewed with patient and family.  Verbalized understanding.  Pt dc'd to home with family.  Schonewitz, Candelaria Stagers 10/06/2012

## 2012-10-06 NOTE — Discharge Summary (Signed)
Physician Discharge Summary  Brittney [name] ZOX:[id number] DOB: [birth year] DOA: 10/03/2012  PCP: Toma Deiters, MD  Admit date: 10/03/2012 Discharge date: 10/06/2012  Time spent: 45 minutes  Recommendations for Outpatient Follow-up:  1. Patient has been set up with home health for disease management of CHF 2. She should follow up with her cardiologist Dr. Myrtis Ser in 1 week. I have asked the staff to set this up 3. Follow up with primary care doctor in 2 weeks  Discharge Diagnoses:  Principal Problem:  *Acute on chronic diastolic congestive heart failure Active Problems:  CAD (coronary artery disease)  Dementia  Atrial fibrillation  Complete heart block  Hypokalemia  PVD (peripheral vascular disease)  Hypoglycemia  Diabetes mellitus type 2 with complications   Discharge Condition: improved  Diet recommendation: low salt, low carb  Filed Weights   10/05/12 0500 10/05/12 0536 10/06/12 0500  Weight: 67.6 kg (149 lb 0.5 oz) 67.677 kg (149 lb 3.2 oz) 68.4 kg (150 lb 12.7 oz)    History of present illness:  Brittney [name] is an 79 y.o. female. Elderly Caucasian lady with Diabetes hypertension and chronic diastolic heart failure, multiple other medical problems listed below; was seen in the emergency room about 9 days ago for CHF exacerbation, and discharged home with a temporary three-day increase in her Lasix dose, and did improve, but now has sudden worsening of dyspnea at rest and and with slight exertion. In the emergency room she has had intravenous continues to be short of breath and the hospitalist service was called to assist with management.  Patient was also found to be hypoglycemic , <45, on serum chemistry in the emergency room today. Daughter says this is the second episode of measured hypoglycemia this week, without symptoms or change in mental status. Her glucose was 39 earlier this week at home.  She was discharged from a nursing home one month ago, back to live with her  daughter who is a Psychologist, sport and exercise in our hospital and accompanies her on this visit; her diet is somewhat change, it is unclear to what degree she is compliant with salt and fluid restriction  Her ACE inhibitor was reportedly discontinued after her recent surgery 3 months ago and has not been restarted;   Hospital Course:  This lady was admitted to the hospital with worsening shortness of breath due to a CHF exacerbation.  Patient was started on IV diuretics and has had a significant improvement.  She is down to 2L of oxygen and her peripheral edema has resolved.  We will attempt to take her off oxygen and check her oxygen saturations.  If the patient desaturates, she may need to go home with oxygen for a short period of time.  Will increase her home dose of lasix to 80mg  daily for the next 3 days then continue at 60mg  daily. Cardiac markers were found to be negative. EKG showed paced rhythm. Echo was repeated which did show a decline in her ejection fraction to 40%.  She was also noted to have some new wall motion abnormalities. Echo was reviewed with Dr. [name] and case discussed.  Recommendations were for outpatient follow up with Dr. Myrtis Ser in 1 week.  Patient was also hypoglycemic on arrival.  Her diabetic meds were held and she was continued on sliding scale insulin. Her blood sugars have improved.  At this time, we will recommend that she stay off her oral hypoglycemics and decrease lantus dosing to 10units daily.  This can be further titrated  as an outpatient.  Atrial fibrillation s/p pacemaker. Rate controlled.  Patient to follow up with cardiology to decide when to start anticoagulation.  CKD3.  Creatinine is stable.  Tolerating diuresis.   Procedures:  none  Consultations:  Telephone consult with Dr. Willette [facility]  Discharge Exam: Filed Vitals:   10/06/12 0500 10/06/12 1032 10/06/12 1120 10/06/12 1213  BP: 164/53  138/67   Pulse: 80  69   Temp: 98.5 F (36.9 C)  97.7  F (36.5 C)   TempSrc: Oral  Oral   Resp: 20  20   Height:      Weight: 68.4 kg (150 lb 12.7 oz)     SpO2: 91% 92% 92% 97%    General: NAD Cardiovascular: S1, s2 RRR Respiratory: minimal crackles at bases, LE edema has resolved  Discharge Instructions      Discharge Orders    Future Appointments: Provider: Department: Dept Phone: Center:   10/27/2012 11:15 AM Hillis Range, MD [facility] ([facility] [place]) [phone] [email or web address]     Future Orders Please Complete By Expires   Diet - low sodium heart healthy      Diet Carb Modified      Increase activity slowly      (HEART FAILURE PATIENTS) Call MD:  Anytime you have any of the following symptoms: 1) 3 pound weight gain in 24 hours or 5 pounds in 1 week 2) shortness of breath, with or without a dry hacking cough 3) swelling in the hands, feet or stomach 4) if you have to sleep on extra pillows at night in order to breathe.          Medication List     As of 10/06/2012  1:11 PM    STOP taking these medications         linagliptin 5 MG Tabs tablet   Commonly known as: TRADJENTA      TAKE these medications         acetaminophen 500 MG tablet   Commonly known as: TYLENOL   Take 1,000 mg by mouth 3 (three) times daily as needed. For pain      aspirin 81 MG EC tablet   Take 1 tablet (81 mg total) by mouth daily.      docusate sodium 100 MG capsule   Commonly known as: COLACE   Take 100 mg by mouth 2 (two) times daily.      furosemide 40 MG tablet   Commonly known as: LASIX   Take 2 tablets daily for 3 more days, then take 1.5 tablets daily      HYDROcodone-acetaminophen 5-325 MG per tablet   Commonly known as: NORCO/VICODIN   Take 1 tablet by mouth every 6 (six) hours as needed for pain.      insulin aspart 100 UNIT/ML injection   Commonly known as: novoLOG   Inject 0-10 Units into the skin 4 (four) times daily -  before meals and at bedtime. Per sliding scale: 0-150=0 units; 151-200=2 units; 201-250=4  units; 251-300=6 units; 301-350=8 units; >350=10 units; >450=CALL MD      insulin glargine 100 UNIT/ML injection   Commonly known as: LANTUS   Inject 10 Units into the skin at bedtime.      levothyroxine 88 MCG tablet   Commonly known as: SYNTHROID, LEVOTHROID   Take 1 tablet (88 mcg total) by mouth daily before breakfast.      metoprolol tartrate 25 MG tablet   Commonly known as: LOPRESSOR   Take  25 mg by mouth 2 (two) times daily.      ondansetron 8 MG disintegrating tablet   Commonly known as: ZOFRAN-ODT   Take 8 mg by mouth every 4 (four) hours as needed. 8mg  ODT q4 hours prn nausea      potassium chloride 10 MEQ tablet   Commonly known as: K-DUR   Take 2 tablets (20 mEq total) by mouth 2 (two) times daily.      sertraline 100 MG tablet   Commonly known as: ZOLOFT   Take 100 mg by mouth daily.      travoprost (benzalkonium) 0.004 % ophthalmic solution   Commonly known as: TRAVATAN   Place 1 drop into both eyes at bedtime.      vitamin C 500 MG tablet   Commonly known as: ASCORBIC ACID   Take 1,000 mg by mouth 2 (two) times daily.        Follow-up Information    Follow up with HASANAJ,XAJE A, MD. Schedule an appointment as soon as possible for a visit in 2 weeks.      Follow up with Willa Rough, MD. Schedule an appointment as soon as possible for a visit in 1 week.   Contact information:   [address] [place] [address] [phone]           The results of significant diagnostics from this hospitalization (including imaging, microbiology, ancillary and laboratory) are listed below for reference.    Significant Diagnostic Studies: Dg Chest 2 View  09/24/2012  *RADIOLOGY REPORT*  Clinical Data: Shortness of breath.  CHEST - 2 VIEW  Comparison: 06/25/2012.  Findings: The apices are cut off the edge of the film.  The pacer wires are stable.  The heart is mildly enlarged but unchanged. There are chronic bronchitic and interstitial lung  changes and superimposed streaky bibasilar atelectasis.  No edema or effusions.  IMPRESSION: Chronic lung changes and superimposed streaky atelectasis.   Original Report Authenticated By: Rudie Meyer, M.D.    Dg Chest Port 1 View  10/03/2012  *RADIOLOGY REPORT*  Clinical Data: Chest pain.  PORTABLE CHEST - 1 VIEW  Comparison: PA and lateral chest 09/24/2012 and 09/23/2011.  Findings: Pacing device is in place.  There is cardiomegaly and pulmonary vascular congestion without frank edema.  No pneumothorax or pleural fluid.  IMPRESSION: Cardiomegaly and pulmonary vascular congestion.   Original Report Authenticated By: Holley Dexter, M.D.     Microbiology: Recent Results (from the past 240 hour(s))  URINE CULTURE     Status: Normal   Collection Time   10/03/12  8:47 PM      Component Value Range Status Comment   Specimen Description URINE, CLEAN CATCH   Final    Special Requests NONE   Final    Culture  Setup Time 10/04/2012 15:47   Final    Colony Count NO GROWTH   Final    Culture NO GROWTH   Final    Report Status 10/05/2012 FINAL   Final   MRSA PCR SCREENING     Status: Normal   Collection Time   10/04/12  3:00 AM      Component Value Range Status Comment   MRSA by PCR NEGATIVE  NEGATIVE Final   CLOSTRIDIUM DIFFICILE BY PCR     Status: Normal   Collection Time   10/04/12  6:02 AM      Component Value Range Status Comment   C difficile by pcr NEGATIVE  NEGATIVE Final  Labs: Basic Metabolic Panel:  Lab 10/06/12 4540 10/05/12 1154 10/05/12 0521 10/04/12 0619 10/03/12 1752  NA 137 136 137 140 138  K 3.1* 4.3 5.7* 4.8 2.9*  CL 94* 94* 99 102 98  CO2 35* 34* 28 28 31   GLUCOSE 254* 264* 225* 179* <45*  BUN 27* 29* 29* 21 21  CREATININE 0.98 1.07 1.17* 1.02 0.96  CALCIUM 9.3 10.0 10.0 9.5 9.9  MG -- -- -- -- 1.7  PHOS -- -- -- -- --   Liver Function Tests:  Lab 10/03/12 1752  AST 23  ALT 13  ALKPHOS 97  BILITOT 0.7  PROT 8.1  ALBUMIN 3.5   No results found for  this basename: LIPASE:5,AMYLASE:5 in the last 168 hours No results found for this basename: AMMONIA:5 in the last 168 hours CBC:  Lab 10/05/12 0521 10/04/12 0752 10/03/12 1752  WBC 10.5 11.2* 12.7*  NEUTROABS -- -- 5.1  HGB 13.6 13.5 15.0  HCT 44.0 42.0 46.0  MCV 94.4 93.3 92.9  PLT 208 214 235   Cardiac Enzymes:  Lab 10/03/12 1752  CKTOTAL --  CKMB --  CKMBINDEX --  TROPONINI <0.30   BNP: BNP (last 3 results)  Basename 10/03/12 1752 09/24/12 1750 01/24/12 2134  PROBNP 8067.0* 6226.0* 3488.0*   CBG:  Lab 10/06/12 1212 10/06/12 0811 10/05/12 2133 10/05/12 1648 10/05/12 1209  GLUCAP 258* 289* 206* 181* 259*       Signed:  Solimar Maiden  [facility] 10/06/2012, 1:11 PM

## 2012-10-14 ENCOUNTER — Encounter: Payer: Medicare Other | Admitting: Physician Assistant

## 2012-10-15 ENCOUNTER — Encounter: Payer: Medicare Other | Admitting: Physician Assistant

## 2012-10-20 ENCOUNTER — Encounter: Payer: Self-pay | Admitting: Physician Assistant

## 2012-10-20 ENCOUNTER — Encounter: Payer: Medicare Other | Admitting: Physician Assistant

## 2012-10-20 ENCOUNTER — Encounter: Payer: Self-pay | Admitting: Cardiology

## 2012-10-20 ENCOUNTER — Ambulatory Visit (INDEPENDENT_AMBULATORY_CARE_PROVIDER_SITE_OTHER): Payer: Medicare Other | Admitting: Physician Assistant

## 2012-10-20 VITALS — BP 115/80 | HR 69 | Ht 67.0 in | Wt 137.0 lb

## 2012-10-20 DIAGNOSIS — I4891 Unspecified atrial fibrillation: Secondary | ICD-10-CM

## 2012-10-20 DIAGNOSIS — I272 Pulmonary hypertension, unspecified: Secondary | ICD-10-CM | POA: Insufficient documentation

## 2012-10-20 DIAGNOSIS — I10 Essential (primary) hypertension: Secondary | ICD-10-CM

## 2012-10-20 DIAGNOSIS — I7090 Unspecified atherosclerosis: Secondary | ICD-10-CM

## 2012-10-20 DIAGNOSIS — I5022 Chronic systolic (congestive) heart failure: Secondary | ICD-10-CM

## 2012-10-20 DIAGNOSIS — I2510 Atherosclerotic heart disease of native coronary artery without angina pectoris: Secondary | ICD-10-CM

## 2012-10-20 DIAGNOSIS — N189 Chronic kidney disease, unspecified: Secondary | ICD-10-CM

## 2012-10-20 DIAGNOSIS — Z79899 Other long term (current) drug therapy: Secondary | ICD-10-CM

## 2012-10-20 MED ORDER — FUROSEMIDE 40 MG PO TABS: 60.0000 mg | ORAL_TABLET | Freq: Every day | ORAL | Status: DC

## 2012-10-20 MED ORDER — POTASSIUM CHLORIDE ER 10 MEQ PO TBCR: 20.0000 meq | EXTENDED_RELEASE_TABLET | Freq: Two times a day (BID) | ORAL | Status: DC

## 2012-10-20 NOTE — Assessment & Plan Note (Signed)
Recently ruled out for MI with negative cardiac markers. Clinically stable on current medication regimen. Continue conservative management, with no further workup currently indicated.

## 2012-10-20 NOTE — Assessment & Plan Note (Signed)
Will check followup labs 

## 2012-10-20 NOTE — Assessment & Plan Note (Signed)
Continue conservative management, as previously outlined. Patient has history of Coumadin anticoagulation, but continues to decline treatment with this, as confirmed by her daughter. Continue low-dose ASA.

## 2012-10-20 NOTE — Patient Instructions (Addendum)
   Labs for BMET, BNP  Office will contact with results Continue all current medications. Follow up in  2 weeks

## 2012-10-20 NOTE — Progress Notes (Signed)
Primary Cardiologist: Jerral Bonito, MD   HPI: Post hospital followup from [facility], status post acute/chronic DHF. We were not formally consulted. 2-D echocardiogram was obtained, as follows:   - EF 40%; moderate/severe AS/apical HK; question apical thrombus; mild MR; moderate PHTN (RVSP 73 mmHg); small right pleural effusion  Clinically, the patient has been doing relatively well since discharge. She reports much less dyspnea, but still requires sleeping on 3 pillows. She denies PND. She has been weighing herself daily, and has lost 11 pounds since last OV. She has not yet had followup blood work. She denies CP or tachycardia palpitations.  Allergies  Allergen Reactions  . Amiodarone     Autonomic dysfunction  . Morphine Sulfate     REACTION: jerking  . Oxycodone Hcl Itching and Other (See Comments)    Feels weird  . Sulfa Antibiotics Other (See Comments)    unknown    Current Outpatient Prescriptions  Medication Sig Dispense Refill  . acetaminophen (TYLENOL) 500 MG tablet Take 1,000 mg by mouth 3 (three) times daily as needed. For pain      . aspirin EC 81 MG EC tablet Take 1 tablet (81 mg total) by mouth daily.  30 tablet  1  . docusate sodium (COLACE) 100 MG capsule Take 100 mg by mouth 2 (two) times daily.      . furosemide (LASIX) 40 MG tablet Take 2 tablets daily for 3 more days, then take 1.5 tablets daily  30 tablet  1  . HYDROcodone-acetaminophen (NORCO) 5-325 MG per tablet Take 1 tablet by mouth every 6 (six) hours as needed for pain.  30 tablet  0  . insulin aspart (NOVOLOG) 100 UNIT/ML injection Inject 0-10 Units into the skin 4 (four) times daily -  before meals and at bedtime. Per sliding scale: 0-150=0 units; 151-200=2 units; 201-250=4 units; 251-300=6 units; 301-350=8 units; >350=10 units; >450=CALL MD      . insulin glargine (LANTUS) 100 UNIT/ML injection Inject 10 Units into the skin at bedtime.  10 mL    . levothyroxine (SYNTHROID, LEVOTHROID) 88 MCG tablet Take 1 tablet (88  mcg total) by mouth daily before breakfast.      . metoprolol tartrate (LOPRESSOR) 25 MG tablet Take 25 mg by mouth 2 (two) times daily.      . ondansetron (ZOFRAN-ODT) 8 MG disintegrating tablet Take 8 mg by mouth every 4 (four) hours as needed. 8mg  ODT q4 hours prn nausea      . potassium chloride (K-DUR) 10 MEQ tablet Take 2 tablets (20 mEq total) by mouth 2 (two) times daily.  30 tablet  1  . sertraline (ZOLOFT) 100 MG tablet Take 100 mg by mouth daily.       . travoprost, benzalkonium, (TRAVATAN) 0.004 % ophthalmic solution Place 1 drop into both eyes at bedtime.       . vitamin C (ASCORBIC ACID) 500 MG tablet Take 1,000 mg by mouth 2 (two) times daily.       No current facility-administered medications for this visit.    Past Medical History  Diagnosis Date  . Hypertension   . Dyslipidemia   . CAD (coronary artery disease)     Non-STEMI August, 2010,,,, 40% left main, 30% LAD, 60% OM 2, 40% RCA, 90% OM1-culprit lesion-too small for intervention           . Ejection fraction     EF 55% ,2011 / EF 30% with tachycardia cardiomyopathy / EF 50% January, 2012  . Diabetes mellitus   .  Dementia     ?? Early dementia ?? 2012  . GERD (gastroesophageal reflux disease)   . TIA (transient ischemic attack)     History of TIAs  . Hypothyroidism   . PAD (peripheral artery disease)     Carotid endarterectomy, right. Stents right peroneal, right superficial, right popliteal arteries, Dr. Edilia Bo  . Amputation     Right midfoot 2010  . Gait disorder   . Vertebrobasilar insufficiency   . Depression   . Hyperkalemia     ACE Inhibitor held  . Blindness of right eye   . Respiratory failure     Hypoxic and hypercapnic, 2011, etiology pneumonia  . Orthostatic hypotension     Syncope January 2012 with facial trauma, amiodarone stopped  . Atrial fibrillation 8/10    on pradaxa, s/p AV nodal ablation  . Ventricular tachycardia     20 beats December, 2012, asymptomatic  . Atrial flutter 05/24/09     Hospitalization in [place]; with difficult rate control; TEE cardioversion done EF 30%, secondary to tachycardia, pt did convert back tosinus rhythm; return 10/11, amiodarone restarted 10/11, no longer on it. Recurrence with RVR 08/2011.  [name] Anemia in CKD (chronic kidney disease)   . Gastroparesis 3/11    Abnormal GES  . Drug therapy,  Sotolol     Sotolol started 08/2011.  . Drug therapy     Pradaxa January, 2013  . Complete heart block     s/p AV nodal ablation  . Chronic diastolic CHF (congestive heart failure)     echo 03/2012- LVEF 55-60%, grade 2 diastolic dysfunction, mild LA dilatation, mild MR, paradoxical vent septal motion  . On home O2     prn  . PONV (postoperative nausea and vomiting)   . S/P AKA (above knee amputation) unilateral     Left AKA October, 2013  . CKD (chronic kidney disease)     Creatinine 1.19 at December 2012 discharge  . Acute renal insufficiency     Catheterization August 2010  . Pacemaker   . Pulmonary hypertension     PA pressure 45 mm of mercury in the past,   //    PA pressure 73 mmHg echo, January, 2014, when patient admitted wit diastolic CHF and ejection fraction down to 40%    Past Surgical History  Procedure Laterality Date  . Total abdominal hysterectomy    . Cholecystectomy    . Carotid endarterectomy    . Amputation  2010    RIGHT MIDFOOT  . Tonsillectomy    . Eye surgeries      Several right  . Pacemaker insertion      SJM Pacemaker implant 9/10  . Pci stent to the right peroneal and right superficial as well as right popliteal arteries      Dr. Edilia Bo  . Av nodal ablation  08/2011    by Dr Ladona Ridgel  . I&d extremity  05/28/2012    Procedure: IRRIGATION AND DEBRIDEMENT EXTREMITY;  Surgeon: Sherren Kerns, MD;  Location: [facility] OR;  Service: Vascular;  Laterality: Left;  possible amputation toes  . Amputation  07/02/2012    Procedure: AMPUTATION ABOVE KNEE;  Surgeon: Nadara Mustard, MD;  Location: [facility] OR;  Service: Orthopedics;   Laterality: Left;  Left Above Knee Amputation    History   Social History  . Marital Status: Widowed    Spouse Name: N/A    Number of Children: 3  . Years of Education: N/A   Occupational History  .  RETIRED    Social History Main Topics  . Smoking status: Never Smoker   . Smokeless tobacco: Never Used  . Alcohol Use: No  . Drug Use: No  . Sexually Active: No   Other Topics Concern  . Not on file   Social History Narrative   Gets regular exercise.    Family History  Problem Relation Age of Onset  . Cancer Brother     Colon cancer  . Aortic aneurysm Mother   . Colon polyps Neg Hx   . Liver disease Neg Hx     And no CRC    ROS: no nausea, vomiting; no fever, chills; no melena, hematochezia; no claudication  PHYSICAL EXAM: BP 115/80  Pulse 69  Ht 5\' 7"  (1.702 m)  Wt 137 lb (62.143 kg)  BMI 21.45 kg/m2  SpO2 97% GENERAL: 79 yo female; NAD HEENT: NCAT, PERRLA, EOMI; sclera clear; no xanthelasma NECK: palpable bilateral carotid pulses, no bruits; no JVD; no TM LUNGS: CTA bilaterally CARDIAC: RRR (S1, S2); no significant murmurs; no rubs or gallops ABDOMEN: soft, non-tender; intact BS EXTREMETIES: Left BKA SKIN: warm/dry; no obvious rash/lesions MUSCULOSKELETAL: no joint deformity NEURO: no focal deficit; NL affect   EKG:    ASSESSMENT & PLAN:  Chronic diastolic CHF (congestive heart failure) Results of recent echocardiogram results were reviewed with Dr. Myrtis Ser, with particular emphasis on the reduced EF of 40%, as well as question of apical thrombus. It was felt that the latter finding was not clinically relevant; therefore, no further workup is indicated at this time.. Patient has been doing clinically better since her recent hospitalization. She was discharged on increased dose of Lasix at 60 mg daily, which we will continue. Will check followup BMET and BNP level, and arrange for early two-week followup with Dr. Myrtis Ser.   Atrial fibrillation Continue  conservative management, as previously outlined. Patient has history of Coumadin anticoagulation, but continues to decline treatment with this, as confirmed by her daughter. Continue low-dose ASA.  Hypertension Well-controlled on current medication regimen  CKD (chronic kidney disease) Will check followup labs  Arteriosclerotic cardiovascular disease (ASCVD) Recently ruled out for MI with negative cardiac markers. Clinically stable on current medication regimen. Continue conservative management, with no further workup currently indicated.    Gene Cletis Muma, PAC

## 2012-10-20 NOTE — Assessment & Plan Note (Signed)
Results of recent echocardiogram results were reviewed with Dr. Myrtis Ser, with particular emphasis on the reduced EF of 40%, as well as question of apical thrombus. It was felt that the latter finding was not clinically relevant; therefore, no further workup is indicated at this time.. Patient has been doing clinically better since her recent hospitalization. She was discharged on increased dose of Lasix at 60 mg daily, which we will continue. Will check followup BMET and BNP level, and arrange for early two-week followup with Dr. Myrtis Ser.

## 2012-10-20 NOTE — Assessment & Plan Note (Signed)
Well-controlled on current medication regimen 

## 2012-10-27 ENCOUNTER — Encounter: Payer: Self-pay | Admitting: Internal Medicine

## 2012-10-27 ENCOUNTER — Ambulatory Visit (INDEPENDENT_AMBULATORY_CARE_PROVIDER_SITE_OTHER): Payer: Medicare Other | Admitting: Internal Medicine

## 2012-10-27 VITALS — BP 91/55 | HR 69 | Ht 67.0 in | Wt 137.0 lb

## 2012-10-27 DIAGNOSIS — I4891 Unspecified atrial fibrillation: Secondary | ICD-10-CM

## 2012-10-27 LAB — PACEMAKER DEVICE OBSERVATION
ATRIAL PACING PM: 1
BAMS-0001: 150 {beats}/min
BAMS-0003: 70 {beats}/min
DEVICE MODEL PM: 2343628
RV LEAD IMPEDENCE PM: 637.5 Ohm
VENTRICULAR PACING PM: 99

## 2012-10-27 MED ORDER — APIXABAN 5 MG PO TABS: 5.0000 mg | ORAL_TABLET | Freq: Two times a day (BID) | ORAL | Status: DC

## 2012-10-27 MED ORDER — APIXABAN 5 MG PO TABS: 5.0000 mg | ORAL_TABLET | Freq: Two times a day (BID) | ORAL | Status: AC

## 2012-10-27 NOTE — Patient Instructions (Addendum)
   Stop Aspirin  Begin Eliquis 5mg  twice a day  Continue all other current medications. Allred - 1 year

## 2012-10-31 NOTE — Progress Notes (Signed)
PCP:  Toma Deiters, MD Primary Cardiologist:  Myrtis Ser  The patient presents today for routine electrophysiology followup.  She is doing reasonably well.  Her SOB is improved.  She has not had recent falls or bleeding.  The patient feels that she is tolerating medications without difficulties and is otherwise without complaint today.   Past Medical History  Diagnosis Date  . Hypertension   . Dyslipidemia   . CAD (coronary artery disease)     Non-STEMI August, 2010,,,, 40% left main, 30% LAD, 60% OM 2, 40% RCA, 90% OM1-culprit lesion-too small for intervention           . Ejection fraction     EF 55% ,2011 / EF 30% with tachycardia cardiomyopathy / EF 50% January, 2012  . Diabetes mellitus   . Dementia     ?? Early dementia ?? 2012  . GERD (gastroesophageal reflux disease)   . TIA (transient ischemic attack)     History of TIAs  . Hypothyroidism   . PAD (peripheral artery disease)     Carotid endarterectomy, right. Stents right peroneal, right superficial, right popliteal arteries, Dr. Edilia Bo  . Amputation     Right midfoot 2010  . Gait disorder   . Vertebrobasilar insufficiency   . Depression   . Hyperkalemia     ACE Inhibitor held  . Blindness of right eye   . Respiratory failure     Hypoxic and hypercapnic, 2011, etiology pneumonia  . Orthostatic hypotension     Syncope January 2012 with facial trauma, amiodarone stopped  . Atrial fibrillation 8/10    on pradaxa, s/p AV nodal ablation  . Ventricular tachycardia     20 beats December, 2012, asymptomatic  . Atrial flutter 05/24/09    Hospitalization in [place]; with difficult rate control; TEE cardioversion done EF 30%, secondary to tachycardia, pt did convert back tosinus rhythm; return 10/11, amiodarone restarted 10/11, no longer on it. Recurrence with RVR 08/2011.  [name] Anemia in CKD (chronic kidney disease)   . Gastroparesis 3/11    Abnormal GES  . Drug therapy,  Sotolol     Sotolol started 08/2011.  . Drug therapy    Pradaxa January, 2013  . Complete heart block     s/p AV nodal ablation  . Chronic diastolic CHF (congestive heart failure)     echo 03/2012- LVEF 55-60%, grade 2 diastolic dysfunction, mild LA dilatation, mild MR, paradoxical vent septal motion  . On home O2     prn  . PONV (postoperative nausea and vomiting)   . S/P AKA (above knee amputation) unilateral     Left AKA October, 2013  . CKD (chronic kidney disease)     Creatinine 1.19 at December 2012 discharge  . Acute renal insufficiency     Catheterization August 2010  . Pacemaker   . Pulmonary hypertension     PA pressure 45 mm of mercury in the past,   //    PA pressure 73 mmHg echo, January, 2014, when patient admitted wit diastolic CHF and ejection fraction down to 40%   Past Surgical History  Procedure Laterality Date  . Total abdominal hysterectomy    . Cholecystectomy    . Carotid endarterectomy    . Amputation  2010    RIGHT MIDFOOT  . Tonsillectomy    . Eye surgeries      Several right  . Pacemaker insertion      SJM Pacemaker implant 9/10  . Pci stent to the right peroneal  and right superficial as well as right popliteal arteries      Dr. Edilia Bo  . Av nodal ablation  08/2011    by Dr Ladona Ridgel  . I&d extremity  05/28/2012    Procedure: IRRIGATION AND DEBRIDEMENT EXTREMITY;  Surgeon: Sherren Kerns, MD;  Location: [facility] OR;  Service: Vascular;  Laterality: Left;  possible amputation toes  . Amputation  07/02/2012    Procedure: AMPUTATION ABOVE KNEE;  Surgeon: Nadara Mustard, MD;  Location: [facility] OR;  Service: Orthopedics;  Laterality: Left;  Left Above Knee Amputation    Current Outpatient Prescriptions  Medication Sig Dispense Refill  . acetaminophen (TYLENOL) 500 MG tablet Take 1,000 mg by mouth 3 (three) times daily as needed. For pain      . docusate sodium (COLACE) 100 MG capsule Take 100 mg by mouth 2 (two) times daily.      . furosemide (LASIX) 40 MG tablet Take 1.5 tablets (60 mg total) by mouth daily.  45  tablet  6  . HYDROcodone-acetaminophen (NORCO) 5-325 MG per tablet Take 1 tablet by mouth every 6 (six) hours as needed for pain.  30 tablet  0  . insulin aspart (NOVOLOG) 100 UNIT/ML injection Inject 0-10 Units into the skin 4 (four) times daily -  before meals and at bedtime. Per sliding scale: 0-150=0 units; 151-200=2 units; 201-250=4 units; 251-300=6 units; 301-350=8 units; >350=10 units; >450=CALL MD      . insulin glargine (LANTUS) 100 UNIT/ML injection Inject 10 Units into the skin at bedtime.  10 mL    . levothyroxine (SYNTHROID, LEVOTHROID) 88 MCG tablet Take 1 tablet (88 mcg total) by mouth daily before breakfast.      . metoprolol tartrate (LOPRESSOR) 25 MG tablet Take 25 mg by mouth 2 (two) times daily.      . ondansetron (ZOFRAN-ODT) 8 MG disintegrating tablet Take 8 mg by mouth every 4 (four) hours as needed. 8mg  ODT q4 hours prn nausea      . potassium chloride (K-DUR) 10 MEQ tablet Take 2 tablets (20 mEq total) by mouth 2 (two) times daily.  120 tablet  6  . sertraline (ZOLOFT) 100 MG tablet Take 100 mg by mouth daily.       . travoprost, benzalkonium, (TRAVATAN) 0.004 % ophthalmic solution Place 1 drop into both eyes at bedtime.       . vitamin C (ASCORBIC ACID) 500 MG tablet Take 1,000 mg by mouth 2 (two) times daily.      [name] apixaban (ELIQUIS) 5 MG TABS tablet Take 1 tablet (5 mg total) by mouth 2 (two) times daily.  60 tablet  6   No current facility-administered medications for this visit.    Allergies  Allergen Reactions  . Amiodarone     Autonomic dysfunction  . Morphine Sulfate     REACTION: jerking  . Oxycodone Hcl Itching and Other (See Comments)    Feels weird  . Sulfa Antibiotics Other (See Comments)    unknown    History   Social History  . Marital Status: Widowed    Spouse Name: N/A    Number of Children: 3  . Years of Education: N/A   Occupational History  . RETIRED    Social History Main Topics  . Smoking status: Never Smoker   . Smokeless  tobacco: Never Used  . Alcohol Use: No  . Drug Use: No  . Sexually Active: No   Other Topics Concern  . Not on file   Social  History Narrative   Gets regular exercise.    Family History  Problem Relation Age of Onset  . Cancer Brother     Colon cancer  . Aortic aneurysm Mother   . Colon polyps Neg Hx   . Liver disease Neg Hx     And no CRC   Physical Exam: Filed Vitals:   10/27/12 1103  BP: 91/55  Pulse: 69  Height: 5\' 7"  (1.702 m)  Weight: 137 lb (62.143 kg)  SpO2: 95%    GEN- The patient is well appearing, alert and oriented x 3 today.   Head- normocephalic, atraumatic Eyes-  Sclera clear, conjunctiva pink Ears- hearing intact Oropharynx- clear Neck- supple, no JVP Lymph- no cervical lymphadenopathy Lungs- Clear to ausculation bilaterally, normal work of breathing Chest- pacemaker pocket is well healed Heart- Regular rate and rhythm (paced) GI- soft, NT, ND, + BS Extremities- no clubbing, cyanosis, or edema  Pacemaker interrogation- reviewed in detail today,  See PACEART report  Assessment and Plan: 1. Complete heart block Normal pacemaker function See Pace Art report No changes today  2. afib Stroke risk is high.  I think that at this time, we should restart anticoagulation.  Given reduced stroke risks with eliquis, I would favor this medicine. I will therefore start eliquis today.  The patient will follow-up with Gene Serpe 11/15/12.

## 2012-11-01 ENCOUNTER — Ambulatory Visit: Payer: Medicare Other | Admitting: Physician Assistant

## 2012-11-03 ENCOUNTER — Ambulatory Visit: Payer: Medicare Other | Admitting: Physician Assistant

## 2012-11-03 ENCOUNTER — Encounter: Payer: Self-pay | Admitting: *Deleted

## 2012-11-10 ENCOUNTER — Telehealth: Payer: Self-pay

## 2012-11-10 ENCOUNTER — Ambulatory Visit: Payer: Medicare Other | Admitting: Physician Assistant

## 2012-11-10 NOTE — Telephone Encounter (Signed)
Received letter from [employer] stating that Eliquis is not covered.  Insurance company was called for a pre-authorization.   Eliquis was pre-authorized for 1 year until 11/10/13.  ZO#[id number].  [facility] was given the authorization information.

## 2012-11-15 ENCOUNTER — Ambulatory Visit: Payer: Medicare Other | Admitting: Physician Assistant

## 2012-11-15 ENCOUNTER — Ambulatory Visit: Payer: Medicare Other | Admitting: Cardiology

## 2012-11-29 ENCOUNTER — Ambulatory Visit (HOSPITAL_COMMUNITY)
Admission: RE | Admit: 2012-11-29 | Discharge: 2012-11-29 | Disposition: A | Discharge status: Home / Self Care | Payer: Medicare Other | Source: Ambulatory Visit | Attending: Orthopedic Surgery | Admitting: Orthopedic Surgery

## 2012-11-29 DIAGNOSIS — M25669 Stiffness of unspecified knee, not elsewhere classified: Secondary | ICD-10-CM | POA: Insufficient documentation

## 2012-11-29 DIAGNOSIS — I10 Essential (primary) hypertension: Secondary | ICD-10-CM | POA: Insufficient documentation

## 2012-11-29 DIAGNOSIS — M6281 Muscle weakness (generalized): Secondary | ICD-10-CM | POA: Insufficient documentation

## 2012-11-29 DIAGNOSIS — M25569 Pain in unspecified knee: Secondary | ICD-10-CM | POA: Insufficient documentation

## 2012-11-29 DIAGNOSIS — IMO0001 Reserved for inherently not codable concepts without codable children: Secondary | ICD-10-CM | POA: Insufficient documentation

## 2012-11-29 DIAGNOSIS — R262 Difficulty in walking, not elsewhere classified: Secondary | ICD-10-CM | POA: Insufficient documentation

## 2012-11-29 NOTE — Evaluation (Signed)
Physical Therapy Evaluation  Patient Details  Name: Brittney [name] MRN: [id number] Date of Birth: [birth year]  Today's Date: 11/29/2012 Time: 1100-1143 PT Time Calculation (min): 43 min Charges: 1 evaluation, 10' TE             Visit#: 1 of 8  Re-eval: 12/29/12 Assessment Diagnosis: Lt AKA Surgical Date: 07/02/12 Next MD Visit: Dr. Lajoyce Corners Prior Therapy: SNF October-Decemeber; HHPT Decemeber- [facility]  Authorization: [facility] Medicare    Authorization Time Period:    Authorization Visit#: 1 of 8   Past Medical History:  Past Medical History  Diagnosis Date  . Hypertension   . Dyslipidemia   . CAD (coronary artery disease)     Non-STEMI August, 2010,,,, 40% left main, 30% LAD, 60% OM 2, 40% RCA, 90% OM1-culprit lesion-too small for intervention           . Ejection fraction     EF 55% ,2011 / EF 30% with tachycardia cardiomyopathy / EF 50% January, 2012  . Diabetes mellitus   . Dementia     ?? Early dementia ?? 2012  . GERD (gastroesophageal reflux disease)   . TIA (transient ischemic attack)     History of TIAs  . Hypothyroidism   . PAD (peripheral artery disease)     Carotid endarterectomy, right. Stents right peroneal, right superficial, right popliteal arteries, Dr. Edilia Bo  . Amputation     Right midfoot 2010  . Gait disorder   . Vertebrobasilar insufficiency   . Depression   . Hyperkalemia     ACE Inhibitor held  . Blindness of right eye   . Respiratory failure     Hypoxic and hypercapnic, 2011, etiology pneumonia  . Orthostatic hypotension     Syncope January 2012 with facial trauma, amiodarone stopped  . Atrial fibrillation 8/10    on pradaxa, s/p AV nodal ablation  . Ventricular tachycardia     20 beats December, 2012, asymptomatic  . Atrial flutter 05/24/09    Hospitalization in [place]; with difficult rate control; TEE cardioversion done EF 30%, secondary to tachycardia, pt did convert back tosinus rhythm; return 10/11, amiodarone restarted 10/11, no longer on  it. Recurrence with RVR 08/2011.  [name] Anemia in CKD (chronic kidney disease)   . Gastroparesis 3/11    Abnormal GES  . Drug therapy,  Sotolol     Sotolol started 08/2011.  . Drug therapy     Pradaxa January, 2013  . Complete heart block     s/p AV nodal ablation  . Chronic diastolic CHF (congestive heart failure)     echo 03/2012- LVEF 55-60%, grade 2 diastolic dysfunction, mild LA dilatation, mild MR, paradoxical vent septal motion  . On home O2     prn  . PONV (postoperative nausea and vomiting)   . S/P AKA (above knee amputation) unilateral     Left AKA October, 2013  . CKD (chronic kidney disease)     Creatinine 1.19 at December 2012 discharge  . Acute renal insufficiency     Catheterization August 2010  . Pacemaker   . Pulmonary hypertension     PA pressure 45 mm of mercury in the past,   //    PA pressure 73 mmHg echo, January, 2014, when patient admitted wit diastolic CHF and ejection fraction down to 40%   Past Surgical History:  Past Surgical History  Procedure Laterality Date  . Total abdominal hysterectomy    . Cholecystectomy    . Carotid endarterectomy    .  Amputation  2010    RIGHT MIDFOOT  . Tonsillectomy    . Eye surgeries      Several right  . Pacemaker insertion      SJM Pacemaker implant 9/10  . Pci stent to the right peroneal and right superficial as well as right popliteal arteries      Dr. Edilia Bo  . Av nodal ablation  08/2011    by Dr Ladona Ridgel  . I&d extremity  05/28/2012    Procedure: IRRIGATION AND DEBRIDEMENT EXTREMITY;  Surgeon: Sherren Kerns, MD;  Location: [facility] OR;  Service: Vascular;  Laterality: Left;  possible amputation toes  . Amputation  07/02/2012    Procedure: AMPUTATION ABOVE KNEE;  Surgeon: Nadara Mustard, MD;  Location: [facility] OR;  Service: Orthopedics;  Laterality: Left;  Left Above Knee Amputation    Subjective Symptoms/Limitations Pertinent History: Pt is a 79 year old female referred to PT s/p lt AKA on 07/02/12 for prosthetic and  gait training. She recieved her LLE prosthetic on 11/26/12 and has not yet walked with it.  C/o of decreased ambulating with her prothesis.  Patient Stated Goals: "I want to be able to walk" Pain Assessment Currently in Pain?: No/denies  Precautions/Restrictions  Precautions Precautions: Fall Restrictions Weight Bearing Restrictions: No  Balance Screening Balance Screen Has the patient fallen in the past 6 months: No Has the patient had a decrease in activity level because of a fear of falling? : No Is the patient reluctant to leave their home because of a fear of falling? : No  Prior Functioning  Home Living Lives With: Daughter Home Access: Ramped entrance Prior Function Driving: No Comments: She enjoys getting out in the community to see her friends and family.  Cognition/Observation Cognition Comments: HOH Observation/Other Assessments Observations: Total Assit to don LLE prosthetic   Assessment LLE Strength Left Hip Flexion: 3+/5 Left Hip Extension: 3/5 Left Hip ABduction: 3/5 Left Hip ADduction: 3/5  Mobility/Balance  Ambulation/Gait Ambulation/Gait: Yes Ambulation/Gait Assistance: 2: Max assist;3: Mod assist Assistive device: Rolling walker Gait Pattern: Decreased stance time - left;Decreased step length - left;Lateral hip instability;Trunk flexed Gait velocity: 0.11 ft/sec Posture/Postural Control Posture/Postural Control: Postural limitations Postural Limitations: requires max cueing to stand tall Static Standing Balance Static Standing - Comment/# of Minutes: requires unilateral UE assist for static rhomberg standing   Exercise/Treatments Standing Gait Training: Max- Mod Assist with cueing for proper  Other Standing Knee Exercises: Both Hip Abduction, Hip Extension, Hip Flexion x5 reps Mod Assist Seated Other Seated Knee Exercises: Hip Abduction w/blue theraband x10 and Hip adduction 5x10 sec holds Standing Standing, One Foot on a Step: Eyes open;4  inch;3 reps;15 secs;Limitations Standing, One Foot on a Step Limitations: mod A BUE support Other Standing Exercises: Pre gait: Right and Left weight shifting x1 minute     Physical Therapy Assessment and Plan PT Assessment and Plan Clinical Impression Statement: Pt is a 79 year old female referred to PT for prosethic and gait training s/p lt TKA on 07/02/12 with following impairments listed below.  At this time able to ambulate 66 feet in 10 minutes w/max assist, requires total assist for donning LLE, has impaired gait mechanics with prosethtic.  At this time has significant support from family members and is motivated to walk again Pt will benefit from skilled therapeutic intervention in order to improve on the following deficits: Abnormal gait;Decreased balance;Decreased activity tolerance;Decreased strength;Difficulty walking;Improper body mechanics Rehab Potential: Good PT Frequency: Min 2X/week PT Duration: 8 weeks PT Treatment/Interventions:  Gait training;Stair training;Functional mobility training;Therapeutic activities;Therapeutic exercise;Balance training;Patient/family education    Goals Home Exercise Program Pt will Perform Home Exercise Program: Independently PT Goal: Perform Home Exercise Program - Progress: Goal set today PT Short Term Goals Time to Complete Short Term Goals: 4 weeks PT Short Term Goal 1: Pt will improve LLE strength in order to ambulate with improved gait mechanics for 100 feet in 10 minutes w/LRAD PT Short Term Goal 2: Pt will improve her proprioception and demonstrate narrow BOS standing with eyes closed for 1 minute/ PT Short Term Goal 3: Pt will complete the berg balance scale  (BBS) and dynamic gait index (DGI) in order to determine approprirate level of support.  PT Long Term Goals Time to Complete Long Term Goals: 8 weeks PT Long Term Goal 1: Pt will improve her BBS to 30/56 and her DGI to 12/24 to decrease her risk of falls.  PT Long Term Goal 2: Pt  will imporve her gait speed and ambulate 0.8 ft/sec for improved safety in the community.  Long Term Goal 3: Pt will improver her LLE strength to [facility] in order to tolerate ambulating 30 minutes in order to perform community mobility.  Long Term Goal 4: Pt will require mod assist to don LLE prosethic in order to improve independence and decrease burden of care.   Problem List Patient Active Problem List  Diagnosis  . Hypertension  . Hyperlipidemia  . Arteriosclerotic cardiovascular disease (ASCVD)  . Pacemaker-St.Jude  . Diabetes mellitus  . Dementia  . GERD (gastroesophageal reflux disease)  . TIA (transient ischemic attack)  . Hypothyroidism  . Carotid artery disease  . PAD (peripheral artery disease)  . CKD (chronic kidney disease)  . Gait disorder  . Vertebrobasilar insufficiency  . Warfarin anticoagulation  . Hyperkalemia  . Acute renal insufficiency  . Blindness of right eye  . Respiratory failure  . Orthostatic hypotension  . Ventricular tachycardia  . Drug therapy,  Sotolol  . Atrial fibrillation  . Atrial flutter  . Complete heart block  . Rotator cuff tear, left  . Rotator cuff tendinitis  . Rotator cuff (capsule) sprain  . Hypokalemia  . Hyponatremia  . Osteomyelitis  . Foot abscess  . End stage renal disease  . PVD (peripheral vascular disease)  . S/P AKA (above knee amputation) unilateral  . Chronic diastolic CHF (congestive heart failure)  . Hypoglycemia  . Pulmonary hypertension  . Difficulty in walking  . Muscle weakness (generalized)    PT Plan of Care PT Home Exercise Plan: see scanned report PT Patient Instructions: importance to continue with independent self care to build strength and activity tolerance, encouraged to ambulate with others present, perform exercises in safe place.  Answered questions.  Called [employer] and left a message for Peyton Najjar to call back about proper fit of protheitic. Consulted and Agree with Plan of Care: Patient;Family  member/caregiver Family Member Consulted: daugther Administrator, arts)  GP Functional Assessment Tool Used: clinical observation  Functional Limitation: Mobility: Walking and moving around Mobility: Walking and Moving Around Current Status [phone]): At least 40 percent but less than 60 percent impaired, limited or restricted Mobility: Walking and Moving Around Goal Status [phone]): At least 1 percent but less than 20 percent impaired, limited or restricted  Henning Ehle, PT 11/29/2012, 3:15 PM  Physician Documentation Your signature is required to indicate approval of the treatment plan as stated above.  Please sign and either send electronically or make a copy of this report for your files and  return this physician signed original.   Please mark one 1.__approve of plan  2. ___approve of plan with the following conditions.   ______________________________                                                          _____________________ Physician Signature                                                                                                             Date

## 2012-12-01 ENCOUNTER — Ambulatory Visit (HOSPITAL_COMMUNITY)
Admission: RE | Admit: 2012-12-01 | Discharge: 2012-12-01 | Disposition: A | Discharge status: Home / Self Care | Payer: Medicare Other | Source: Ambulatory Visit | Attending: Orthopedic Surgery | Admitting: Orthopedic Surgery

## 2012-12-01 NOTE — Progress Notes (Signed)
Physical Therapy Treatment Patient Details  Name: Brittney [name] MRN: [id number] Date of Birth: 01/27/1932  Today's Date: 12/01/2012 Time: 4098-1191 PT Time Calculation (min): 47 min  Visit#: 2 of 8  Re-eval: 12/29/12 Authorization: [facility] Medicare  Authorization Visit#: 2 of 8  Charges: Gait 24', therex 16'  Subjective: Symptoms/Limitations Symptoms: Pt. states she is doing her HEP.  States her goal is to walk without an AD.  Does not return to prosthetist for 3 more months. Pain Assessment Currently in Pain?: No/denies   Exercise/Treatments Mobility/Balance  Transfers Transfers: Sit to Stand;Stand to Sit Sit to Stand: 4: Min assist Stand to Sit: 4: Min assist Stand to Sit Details: Pt. requires assistance for safety and to assure steadiness upon standing.  RT brake on WC pops loose at times; attempted to fix but unable, recommended to CG to return to Store Ambulation/Gait Ambulation/Gait: Yes Ambulation/Gait Assistance: 4: Min assist Ambulation/Gait Assistance Details: Also ambulated 20' with HW and Max assist.  Pt. able to complete, however decreased confidence in ability. Ambulation Distance (Feet): 128 Feet Assistive device: Rolling walker Gait Pattern: Decreased stance time - left;Decreased step length - left;Lateral hip instability;Trunk flexed Stairs: No Door Management: 2: Max assist    Standing Gait Training: Min Assist with RW X 128', Max Assist with HW X 20' Other Standing Knee Exercises: Both Hip Abduction, Hip Extension, Hip Flexion x10reps Mod Assist Seated Other Seated Knee Exercises: HEP: Hip Abduction w/blue theraband x10 and Hip adduction 5x10 sec holds Other Seated Knee Exercises: sit to stand X 5 reps    Physical Therapy Assessment and Plan PT Assessment and Plan Clinical Impression Statement: Pt. able to increase ambulation distance today to 128 feet using RW.  Pt. requires postural cues as well as cues to advance walker and L LE further.   Pt  .requires tactile cues to perform standing LE exercises to decrease substitution from other mm groups.  Attempted HW with pt. today, however not quite confident with task yet.  PT Plan: Continue to progress towards goals, increasing LE strength and ambulation independence.     Problem List Patient Active Problem List  Diagnosis  . Hypertension  . Hyperlipidemia  . Arteriosclerotic cardiovascular disease (ASCVD)  . Pacemaker-St.Jude  . Diabetes mellitus  . Dementia  . GERD (gastroesophageal reflux disease)  . TIA (transient ischemic attack)  . Hypothyroidism  . Carotid artery disease  . PAD (peripheral artery disease)  . CKD (chronic kidney disease)  . Gait disorder  . Vertebrobasilar insufficiency  . Warfarin anticoagulation  . Hyperkalemia  . Acute renal insufficiency  . Blindness of right eye  . Respiratory failure  . Orthostatic hypotension  . Ventricular tachycardia  . Drug therapy,  Sotolol  . Atrial fibrillation  . Atrial flutter  . Complete heart block  . Rotator cuff tear, left  . Rotator cuff tendinitis  . Rotator cuff (capsule) sprain  . Hypokalemia  . Hyponatremia  . Osteomyelitis  . Foot abscess  . End stage renal disease  . PVD (peripheral vascular disease)  . S/P AKA (above knee amputation) unilateral  . Chronic diastolic CHF (congestive heart failure)  . Hypoglycemia  . Pulmonary hypertension  . Difficulty in walking  . Muscle weakness (generalized)    PT Plan of Care PT Patient Instructions: Suggested taking wheelchair to DME store to get brakes adjusted for safety Family Member Consulted: daugther (Brittney [name])   Lurena Nida, PTA/CLT 12/01/2012, 11:40 AM

## 2012-12-06 ENCOUNTER — Other Ambulatory Visit: Payer: Self-pay | Admitting: Physician Assistant

## 2012-12-06 ENCOUNTER — Ambulatory Visit (HOSPITAL_COMMUNITY)
Admission: RE | Admit: 2012-12-06 | Discharge: 2012-12-06 | Disposition: A | Discharge status: Home / Self Care | Payer: Medicare Other | Source: Ambulatory Visit | Attending: Orthopedic Surgery | Admitting: Orthopedic Surgery

## 2012-12-06 NOTE — Progress Notes (Signed)
Physical Therapy Treatment Patient Details  Name: Brittney [name] MRN: [id number] Date of Birth: [birth year]  Today's Date: 12/06/2012 Time: 1020-1112 PT Time Calculation (min): 52 min Visit#: 3 of 8  Re-eval: 12/29/12 Authorization: [employer]  Authorization Visit#: 3 of 8  Charges:  Gait 35', therapeutic activity 12'  Subjective: Symptoms/Limitations Symptoms: Pt. had to run to restroom when got to therapy.   States her prosthesis is rubbing at her inner groin area.  No pain reported. Pain Assessment Currently in Pain?: No/denies   Exercise/Treatments Standing Gait Training: Min Assist with RW X 80' X 3 bouts', 80' in 6 minutes Seated Other Seated Knee Exercises: sit to stand X 5 reps    Physical Therapy Assessment and Plan PT Assessment and Plan Clinical Impression Statement: Pt unable to ambulate further than 29' due to c/o UE's hurting and rubbing of prosthesis in LT inner thigh.  Also with noted ER of prosthesis with ambulation.  Pt. able to remove prosthesis independently.   Very loose fit inside the socket due to tissue and muscle atrophy.  Replaced 1-ply with 5-ply with much better fit, however continues to ER sligtly.  Suggested to daugher may want to add a couple more plys to increase fit.  If not any better, may want to schedule appt. with prosthetist.  Pt. with slightly redened area in inner thigh, however no skin breakdown.  Pt .reported no rubbing after ply increased.  Encouraged pt. to place more weight through LE's to decrease weight through UE's, keep head straight  and step through gait.  PT Plan: Continue to progress towards goals, increasing LE strength and ambulation independence.     Problem List Patient Active Problem List  Diagnosis  . Hypertension  . Hyperlipidemia  . Arteriosclerotic cardiovascular disease (ASCVD)  . Pacemaker-St.Jude  . Diabetes mellitus  . Dementia  . GERD (gastroesophageal reflux disease)  . TIA (transient ischemic attack)  .  Hypothyroidism  . Carotid artery disease  . PAD (peripheral artery disease)  . CKD (chronic kidney disease)  . Gait disorder  . Vertebrobasilar insufficiency  . Warfarin anticoagulation  . Hyperkalemia  . Acute renal insufficiency  . Blindness of right eye  . Respiratory failure  . Orthostatic hypotension  . Ventricular tachycardia  . Drug therapy,  Sotolol  . Atrial fibrillation  . Atrial flutter  . Complete heart block  . Rotator cuff tear, left  . Rotator cuff tendinitis  . Rotator cuff (capsule) sprain  . Hypokalemia  . Hyponatremia  . Osteomyelitis  . Foot abscess  . End stage renal disease  . PVD (peripheral vascular disease)  . S/P AKA (above knee amputation) unilateral  . Chronic diastolic CHF (congestive heart failure)  . Hypoglycemia  . Pulmonary hypertension  . Difficulty in walking  . Muscle weakness (generalized)    PT - End of Session Equipment Utilized During Treatment: Gait belt Activity Tolerance: Patient tolerated treatment well General Behavior During Session: [facility] for tasks performed Cognition: [facility] for tasks performed    Lurena Nida, PTA/CLT 12/06/2012, 12:14 PM

## 2012-12-08 ENCOUNTER — Ambulatory Visit (HOSPITAL_COMMUNITY)
Admission: RE | Admit: 2012-12-08 | Discharge: 2012-12-08 | Disposition: A | Discharge status: Home / Self Care | Payer: Medicare Other | Source: Ambulatory Visit | Attending: Orthopedic Surgery | Admitting: Orthopedic Surgery

## 2012-12-08 DIAGNOSIS — IMO0001 Reserved for inherently not codable concepts without codable children: Secondary | ICD-10-CM | POA: Insufficient documentation

## 2012-12-08 DIAGNOSIS — R262 Difficulty in walking, not elsewhere classified: Secondary | ICD-10-CM | POA: Insufficient documentation

## 2012-12-08 DIAGNOSIS — M6281 Muscle weakness (generalized): Secondary | ICD-10-CM | POA: Insufficient documentation

## 2012-12-08 DIAGNOSIS — M25669 Stiffness of unspecified knee, not elsewhere classified: Secondary | ICD-10-CM | POA: Insufficient documentation

## 2012-12-08 DIAGNOSIS — M25569 Pain in unspecified knee: Secondary | ICD-10-CM | POA: Insufficient documentation

## 2012-12-08 DIAGNOSIS — I10 Essential (primary) hypertension: Secondary | ICD-10-CM | POA: Insufficient documentation

## 2012-12-08 NOTE — Progress Notes (Signed)
Physical Therapy Treatment Patient Details  Name: MAYELIN PANOS MRN: [id number] Date of Birth: 07-06-1932  Today's Date: 12/08/2012 Time: 1110-1204 PT Time Calculation (min): 54 min  Visit#: 4 of 8  Re-eval: 12/29/12 Authorization: [employer]  Authorization Visit#: 4 of 8  Charges:  Gait 35', self care 15'  Subjective: Symptoms/Limitations Symptoms: Pt. reports her leg still feels a little loose with the 5 Ply.  Reports no pain today. Pain Assessment Currently in Pain?: No/denies   Exercise/Treatments Gait Training: Min Assist with RW 400' total with 5 rest breaks.  Outdoor ambulation down ramp and up incline, curb negotiation with walker    Physical Therapy Assessment and Plan PT Assessment and Plan Clinical Impression Statement: Added 3 ply to 5 already on socket.  Much tighter fit with less discomfort in groin and decreased ER.  Working on stepping through with gait and advancing walker further in front.  Pt. negotiated curb with verbal cues and min assist.  Able to control descent on ramp, however with noted fear of falling.  Residual limb assessed after ambulation without any noted redness.  One hardened area in groin area, however not painful or signs of tissue breakdown.  Daughter, [name] informed of area and to use 8Ply with prosthesis. PT Plan: Continue to progress towards goals, increasing LE strength and ambulation independence.     Problem List Patient Active Problem List  Diagnosis  . Hypertension  . Hyperlipidemia  . Arteriosclerotic cardiovascular disease (ASCVD)  . Pacemaker-St.Jude  . Diabetes mellitus  . Dementia  . GERD (gastroesophageal reflux disease)  . TIA (transient ischemic attack)  . Hypothyroidism  . Carotid artery disease  . PAD (peripheral artery disease)  . CKD (chronic kidney disease)  . Gait disorder  . Vertebrobasilar insufficiency  . Warfarin anticoagulation  . Hyperkalemia  . Acute renal insufficiency  . Blindness of right eye  .  Respiratory failure  . Orthostatic hypotension  . Ventricular tachycardia  . Drug therapy,  Sotolol  . Atrial fibrillation  . Atrial flutter  . Complete heart block  . Rotator cuff tear, left  . Rotator cuff tendinitis  . Rotator cuff (capsule) sprain  . Hypokalemia  . Hyponatremia  . Osteomyelitis  . Foot abscess  . End stage renal disease  . PVD (peripheral vascular disease)  . S/P AKA (above knee amputation) unilateral  . Chronic diastolic CHF (congestive heart failure)  . Hypoglycemia  . Pulmonary hypertension  . Difficulty in walking  . Muscle weakness (generalized)    PT - End of Session Equipment Utilized During Treatment: Gait belt Activity Tolerance: Patient tolerated treatment well General Behavior During Session: [facility] for tasks performed Cognition: [facility] for tasks performed   Lurena Nida, PTA/CLT 12/08/2012, 12:14 PM

## 2012-12-13 ENCOUNTER — Encounter (HOSPITAL_COMMUNITY): Payer: Self-pay

## 2012-12-13 ENCOUNTER — Emergency Department (HOSPITAL_COMMUNITY)
Admission: EM | Admit: 2012-12-13 | Discharge: 2012-12-14 | Disposition: A | Discharge status: Home / Self Care | Payer: Medicare Other | Attending: Emergency Medicine | Admitting: Emergency Medicine

## 2012-12-13 ENCOUNTER — Emergency Department (HOSPITAL_COMMUNITY): Payer: Medicare Other

## 2012-12-13 ENCOUNTER — Ambulatory Visit (HOSPITAL_COMMUNITY): Payer: Medicare Other | Admitting: Physical Therapy

## 2012-12-13 DIAGNOSIS — N189 Chronic kidney disease, unspecified: Secondary | ICD-10-CM | POA: Insufficient documentation

## 2012-12-13 DIAGNOSIS — Z9981 Dependence on supplemental oxygen: Secondary | ICD-10-CM | POA: Insufficient documentation

## 2012-12-13 DIAGNOSIS — R51 Headache: Secondary | ICD-10-CM | POA: Insufficient documentation

## 2012-12-13 DIAGNOSIS — I4891 Unspecified atrial fibrillation: Secondary | ICD-10-CM | POA: Insufficient documentation

## 2012-12-13 DIAGNOSIS — J3489 Other specified disorders of nose and nasal sinuses: Secondary | ICD-10-CM | POA: Insufficient documentation

## 2012-12-13 DIAGNOSIS — I2510 Atherosclerotic heart disease of native coronary artery without angina pectoris: Secondary | ICD-10-CM | POA: Insufficient documentation

## 2012-12-13 DIAGNOSIS — Z87448 Personal history of other diseases of urinary system: Secondary | ICD-10-CM | POA: Insufficient documentation

## 2012-12-13 DIAGNOSIS — E039 Hypothyroidism, unspecified: Secondary | ICD-10-CM | POA: Insufficient documentation

## 2012-12-13 DIAGNOSIS — I5032 Chronic diastolic (congestive) heart failure: Secondary | ICD-10-CM | POA: Insufficient documentation

## 2012-12-13 DIAGNOSIS — Z79899 Other long term (current) drug therapy: Secondary | ICD-10-CM | POA: Insufficient documentation

## 2012-12-13 DIAGNOSIS — Z8639 Personal history of other endocrine, nutritional and metabolic disease: Secondary | ICD-10-CM | POA: Insufficient documentation

## 2012-12-13 DIAGNOSIS — J40 Bronchitis, not specified as acute or chronic: Secondary | ICD-10-CM

## 2012-12-13 DIAGNOSIS — Z8679 Personal history of other diseases of the circulatory system: Secondary | ICD-10-CM | POA: Insufficient documentation

## 2012-12-13 DIAGNOSIS — F0390 Unspecified dementia without behavioral disturbance: Secondary | ICD-10-CM | POA: Insufficient documentation

## 2012-12-13 DIAGNOSIS — I4892 Unspecified atrial flutter: Secondary | ICD-10-CM | POA: Insufficient documentation

## 2012-12-13 DIAGNOSIS — Z794 Long term (current) use of insulin: Secondary | ICD-10-CM | POA: Insufficient documentation

## 2012-12-13 DIAGNOSIS — E119 Type 2 diabetes mellitus without complications: Secondary | ICD-10-CM | POA: Insufficient documentation

## 2012-12-13 DIAGNOSIS — R062 Wheezing: Secondary | ICD-10-CM | POA: Insufficient documentation

## 2012-12-13 DIAGNOSIS — J069 Acute upper respiratory infection, unspecified: Secondary | ICD-10-CM

## 2012-12-13 DIAGNOSIS — Z8673 Personal history of transient ischemic attack (TIA), and cerebral infarction without residual deficits: Secondary | ICD-10-CM | POA: Insufficient documentation

## 2012-12-13 DIAGNOSIS — S78119A Complete traumatic amputation at level between unspecified hip and knee, initial encounter: Secondary | ICD-10-CM | POA: Insufficient documentation

## 2012-12-13 DIAGNOSIS — E875 Hyperkalemia: Secondary | ICD-10-CM | POA: Insufficient documentation

## 2012-12-13 DIAGNOSIS — J209 Acute bronchitis, unspecified: Secondary | ICD-10-CM | POA: Insufficient documentation

## 2012-12-13 DIAGNOSIS — H5440 Blindness, one eye, unspecified eye: Secondary | ICD-10-CM | POA: Insufficient documentation

## 2012-12-13 DIAGNOSIS — R42 Dizziness and giddiness: Secondary | ICD-10-CM | POA: Insufficient documentation

## 2012-12-13 DIAGNOSIS — Z950 Presence of cardiac pacemaker: Secondary | ICD-10-CM | POA: Insufficient documentation

## 2012-12-13 DIAGNOSIS — J9600 Acute respiratory failure, unspecified whether with hypoxia or hypercapnia: Secondary | ICD-10-CM | POA: Insufficient documentation

## 2012-12-13 DIAGNOSIS — H9209 Otalgia, unspecified ear: Secondary | ICD-10-CM | POA: Insufficient documentation

## 2012-12-13 DIAGNOSIS — I129 Hypertensive chronic kidney disease with stage 1 through stage 4 chronic kidney disease, or unspecified chronic kidney disease: Secondary | ICD-10-CM | POA: Insufficient documentation

## 2012-12-13 DIAGNOSIS — E785 Hyperlipidemia, unspecified: Secondary | ICD-10-CM | POA: Insufficient documentation

## 2012-12-13 DIAGNOSIS — Z8719 Personal history of other diseases of the digestive system: Secondary | ICD-10-CM | POA: Insufficient documentation

## 2012-12-13 DIAGNOSIS — F3289 Other specified depressive episodes: Secondary | ICD-10-CM | POA: Insufficient documentation

## 2012-12-13 DIAGNOSIS — Z862 Personal history of diseases of the blood and blood-forming organs and certain disorders involving the immune mechanism: Secondary | ICD-10-CM | POA: Insufficient documentation

## 2012-12-13 DIAGNOSIS — I252 Old myocardial infarction: Secondary | ICD-10-CM | POA: Insufficient documentation

## 2012-12-13 DIAGNOSIS — D649 Anemia, unspecified: Secondary | ICD-10-CM | POA: Insufficient documentation

## 2012-12-13 DIAGNOSIS — F329 Major depressive disorder, single episode, unspecified: Secondary | ICD-10-CM | POA: Insufficient documentation

## 2012-12-13 DIAGNOSIS — R6889 Other general symptoms and signs: Secondary | ICD-10-CM | POA: Insufficient documentation

## 2012-12-13 MED ORDER — AMOXICILLIN 500 MG PO CAPS: 500.0000 mg | ORAL_CAPSULE | Freq: Three times a day (TID) | ORAL | Status: AC

## 2012-12-13 MED ORDER — PREDNISONE 10 MG PO TABS: ORAL_TABLET | ORAL | Status: DC

## 2012-12-13 MED ORDER — PENICILLIN V POTASSIUM 250 MG PO TABS
500.0000 mg | ORAL_TABLET | Freq: Once | ORAL | Status: AC
  Administered 2012-12-14: 500 mg via ORAL
  Filled 2012-12-13: qty 2

## 2012-12-13 MED ORDER — ALBUTEROL SULFATE HFA 108 (90 BASE) MCG/ACT IN AERS
2.0000 | INHALATION_SPRAY | RESPIRATORY_TRACT | Status: DC
  Administered 2012-12-14: 2 via RESPIRATORY_TRACT
  Filled 2012-12-13: qty 6.7

## 2012-12-13 MED ORDER — PREDNISONE 50 MG PO TABS
60.0000 mg | ORAL_TABLET | Freq: Once | ORAL | Status: AC
  Administered 2012-12-14: 60 mg via ORAL
  Filled 2012-12-13: qty 1

## 2012-12-13 NOTE — ED Provider Notes (Signed)
History     CSN: [id number]  Arrival date & time 12/13/12  2140   First MD Initiated Contact with Patient 12/13/12 2241      Chief Complaint  Patient presents with  . URI    (Consider location/radiation/quality/duration/timing/severity/associated sxs/prior treatment) Patient is a 79 y.o. female presenting with URI. The history is provided by the patient and a relative.  URI Presenting symptoms: congestion and ear pain   Presenting symptoms: no cough   Presenting symptoms comment:  Dizziness Severity:  Moderate Onset quality:  Gradual Duration:  1 week Progression:  Worsening Chronicity:  New Relieved by:  Nothing Worsened by:  Nothing tried Ineffective treatments:  OTC medications Associated symptoms: headaches and sneezing   Associated symptoms: no arthralgias, no neck pain, no sinus pain and no wheezing   Risk factors: being elderly and sick contacts     Past Medical History  Diagnosis Date  . Hypertension   . Dyslipidemia   . CAD (coronary artery disease)     Non-STEMI August, 2010,,,, 40% left main, 30% LAD, 60% OM 2, 40% RCA, 90% OM1-culprit lesion-too small for intervention           . Ejection fraction     EF 55% ,2011 / EF 30% with tachycardia cardiomyopathy / EF 50% January, 2012  . Diabetes mellitus   . Dementia     ?? Early dementia ?? 2012  . GERD (gastroesophageal reflux disease)   . TIA (transient ischemic attack)     History of TIAs  . Hypothyroidism   . PAD (peripheral artery disease)     Carotid endarterectomy, right. Stents right peroneal, right superficial, right popliteal arteries, Dr. Edilia Bo  . Amputation     Right midfoot 2010  . Gait disorder   . Vertebrobasilar insufficiency   . Depression   . Hyperkalemia     ACE Inhibitor held  . Blindness of right eye   . Respiratory failure     Hypoxic and hypercapnic, 2011, etiology pneumonia  . Orthostatic hypotension     Syncope January 2012 with facial trauma, amiodarone stopped  . Atrial  fibrillation 8/10    on pradaxa, s/p AV nodal ablation  . Ventricular tachycardia     20 beats December, 2012, asymptomatic  . Atrial flutter 05/24/09    Hospitalization in [place]; with difficult rate control; TEE cardioversion done EF 30%, secondary to tachycardia, pt did convert back tosinus rhythm; return 10/11, amiodarone restarted 10/11, no longer on it. Recurrence with RVR 08/2011.  [name] Anemia in CKD (chronic kidney disease)   . Gastroparesis 3/11    Abnormal GES  . Drug therapy,  Sotolol     Sotolol started 08/2011.  . Drug therapy     Pradaxa January, 2013  . Complete heart block     s/p AV nodal ablation  . Chronic diastolic CHF (congestive heart failure)     echo 03/2012- LVEF 55-60%, grade 2 diastolic dysfunction, mild LA dilatation, mild MR, paradoxical vent septal motion  . On home O2     prn  . PONV (postoperative nausea and vomiting)   . S/P AKA (above knee amputation) unilateral     Left AKA October, 2013  . CKD (chronic kidney disease)     Creatinine 1.19 at December 2012 discharge  . Acute renal insufficiency     Catheterization August 2010  . Pacemaker   . Pulmonary hypertension     PA pressure 45 mm of mercury in the past,   //  PA pressure 73 mmHg echo, January, 2014, when patient admitted wit diastolic CHF and ejection fraction down to 40%    Past Surgical History  Procedure Laterality Date  . Total abdominal hysterectomy    . Cholecystectomy    . Carotid endarterectomy    . Amputation  2010    RIGHT MIDFOOT  . Tonsillectomy    . Eye surgeries      Several right  . Pacemaker insertion      SJM Pacemaker implant 9/10  . Pci stent to the right peroneal and right superficial as well as right popliteal arteries      Dr. Edilia Bo  . Av nodal ablation  08/2011    by Dr Ladona Ridgel  . I&d extremity  05/28/2012    Procedure: IRRIGATION AND DEBRIDEMENT EXTREMITY;  Surgeon: Sherren Kerns, MD;  Location: [facility] OR;  Service: Vascular;  Laterality: Left;  possible  amputation toes  . Amputation  07/02/2012    Procedure: AMPUTATION ABOVE KNEE;  Surgeon: Nadara Mustard, MD;  Location: [facility] OR;  Service: Orthopedics;  Laterality: Left;  Left Above Knee Amputation    Family History  Problem Relation Age of Onset  . Cancer Brother     Colon cancer  . Aortic aneurysm Mother   . Colon polyps Neg Hx   . Liver disease Neg Hx     And no CRC    History  Substance Use Topics  . Smoking status: Never Smoker   . Smokeless tobacco: Never Used  . Alcohol Use: No    OB History   Grav Para Term Preterm Abortions TAB SAB Ect Mult Living                  Review of Systems  Constitutional: Negative for activity change.       All ROS Neg except as noted in HPI  HENT: Positive for ear pain, congestion and sneezing. Negative for nosebleeds and neck pain.   Eyes: Negative for photophobia and discharge.  Respiratory: Negative for cough, shortness of breath and wheezing.   Cardiovascular: Negative for chest pain and palpitations.  Gastrointestinal: Negative for abdominal pain and blood in stool.  Genitourinary: Negative for dysuria, frequency and hematuria.  Musculoskeletal: Negative for back pain and arthralgias.  Skin: Negative.   Neurological: Positive for headaches. Negative for dizziness, seizures and speech difficulty.  Psychiatric/Behavioral: Negative for hallucinations and confusion.    Allergies  Amiodarone; Morphine sulfate; Oxycodone hcl; and Sulfa antibiotics  Home Medications   Current Outpatient Rx  Name  Route  Sig  Dispense  Refill  . acetaminophen (TYLENOL) 500 MG tablet   Oral   Take 1,000 mg by mouth 3 (three) times daily as needed. For pain         . apixaban (ELIQUIS) 5 MG TABS tablet   Oral   Take 1 tablet (5 mg total) by mouth 2 (two) times daily.   60 tablet   6   . docusate sodium (COLACE) 100 MG capsule   Oral   Take 100 mg by mouth 2 (two) times daily.         . furosemide (LASIX) 40 MG tablet   Oral   Take 1.5  tablets (60 mg total) by mouth daily.   45 tablet   6   . HYDROcodone-acetaminophen (NORCO) 5-325 MG per tablet   Oral   Take 1 tablet by mouth every 6 (six) hours as needed for pain.   30 tablet   0   .  insulin aspart (NOVOLOG) 100 UNIT/ML injection   Subcutaneous   Inject 0-10 Units into the skin 4 (four) times daily -  before meals and at bedtime. Per sliding scale: 0-150=0 units; 151-200=2 units; 201-250=4 units; 251-300=6 units; 301-350=8 units; >350=10 units; >450=CALL MD         . insulin glargine (LANTUS) 100 UNIT/ML injection   Subcutaneous   Inject 10 Units into the skin at bedtime.   10 mL      . levothyroxine (SYNTHROID, LEVOTHROID) 88 MCG tablet   Oral   Take 1 tablet (88 mcg total) by mouth daily before breakfast.         . metoprolol tartrate (LOPRESSOR) 25 MG tablet   Oral   Take 25 mg by mouth 2 (two) times daily.         . ondansetron (ZOFRAN-ODT) 8 MG disintegrating tablet   Oral   Take 8 mg by mouth every 4 (four) hours as needed. 8mg  ODT q4 hours prn nausea         . potassium chloride (K-DUR) 10 MEQ tablet   Oral   Take 2 tablets (20 mEq total) by mouth 2 (two) times daily.   120 tablet   6   . potassium chloride (K-DUR,KLOR-CON) 10 MEQ tablet      TAKE 2 TABLETS BY MOUTH TWICE DAILY   120 tablet   3     Patient needs to call office and schedule 2 week a ...   . sertraline (ZOLOFT) 100 MG tablet   Oral   Take 100 mg by mouth daily.          . travoprost, benzalkonium, (TRAVATAN) 0.004 % ophthalmic solution   Both Eyes   Place 1 drop into both eyes at bedtime.          . vitamin C (ASCORBIC ACID) 500 MG tablet   Oral   Take 1,000 mg by mouth 2 (two) times daily.           BP 139/55  Pulse 70  Temp(Src) 98.3 F (36.8 C) (Oral)  Resp 18  Ht 5\' 7"  (1.702 m)  Wt 147 lb (66.679 kg)  BMI 23.02 kg/m2  SpO2 96%  Physical Exam  Nursing note and vitals reviewed. Constitutional: She is oriented to person, place, and time.  She appears well-developed and well-nourished.  Non-toxic appearance.  HENT:  Head: Normocephalic.  Right Ear: Tympanic membrane and external ear normal.  Left Ear: Tympanic membrane and external ear normal.  Nasal cngestion. Hearing aide in the right and left ear.  Eyes: EOM and lids are normal. Pupils are equal, round, and reactive to light. Right eye exhibits no discharge. Left eye exhibits no discharge. No scleral icterus.  Blind in the right eye  Neck: Normal range of motion. Neck supple. Carotid bruit is not present.  Cardiovascular: Normal rate, regular rhythm, normal heart sounds, intact distal pulses and normal pulses.   Pulmonary/Chest: No respiratory distress. She has wheezes. She has rhonchi.  Abdominal: Soft. Bowel sounds are normal. There is no tenderness. There is no guarding.  Musculoskeletal: Normal range of motion. She exhibits no edema.  Left below knee amputee.  Lymphadenopathy:       Head (right side): No submandibular adenopathy present.       Head (left side): No submandibular adenopathy present.    She has no cervical adenopathy.  Neurological: She is alert and oriented to person, place, and time. She has normal strength. No cranial nerve deficit or  sensory deficit.  Skin: Skin is warm and dry.  Psychiatric: She has a normal mood and affect. Her speech is normal.    ED Course  Procedures (including critical care time)  Labs Reviewed - No data to display No results found.   No diagnosis found.    MDM  I have reviewed nursing notes, vital signs, and all appropriate lab and imaging results for this patient. Pt presents to ED with nasal and chest congestion. Some cough and wheezing. No reported high fever or hemoptysis.  No c/o chest pain.  Chest xray is neg for pneumonia. Vascular congestion improving. Pulse ox 96% on room air. Pt speaks in complete sentences. She is in no distress. Pt treated with albuterol inhaler and amoxil and prednisone taper. Pt to  follow up with primary MD and return to ED if any changes or problem. Plan discussed with pt and family and they are in agreement.     [name] Dike, PA-C 12/15/12 1037

## 2012-12-13 NOTE — ED Notes (Signed)
Bilateral ear aches and chest congestion

## 2012-12-13 NOTE — ED Notes (Signed)
Pt with chest congestion and NP cough, also with bilateral earaches

## 2012-12-15 ENCOUNTER — Ambulatory Visit (HOSPITAL_COMMUNITY): Payer: Medicare Other | Admitting: Physical Therapy

## 2012-12-17 NOTE — ED Provider Notes (Signed)
Medical screening examination/treatment/procedure(s) were performed by non-physician practitioner and as supervising physician I was immediately available for consultation/collaboration.   Joya Gaskins, MD 12/17/12 [phone]

## 2012-12-20 ENCOUNTER — Ambulatory Visit (HOSPITAL_COMMUNITY): Payer: Medicare Other | Admitting: Physical Therapy

## 2012-12-20 ENCOUNTER — Inpatient Hospital Stay (HOSPITAL_COMMUNITY)
Admission: EM | Admit: 2012-12-20 | Discharge: 2012-12-22 | DRG: 683 | Disposition: A | Discharge status: Home / Self Care | Payer: Medicare Other | Attending: Internal Medicine | Admitting: Internal Medicine

## 2012-12-20 ENCOUNTER — Encounter (HOSPITAL_COMMUNITY): Payer: Self-pay | Admitting: *Deleted

## 2012-12-20 ENCOUNTER — Emergency Department (HOSPITAL_COMMUNITY): Payer: Medicare Other

## 2012-12-20 DIAGNOSIS — F329 Major depressive disorder, single episode, unspecified: Secondary | ICD-10-CM | POA: Diagnosis present

## 2012-12-20 DIAGNOSIS — I2789 Other specified pulmonary heart diseases: Secondary | ICD-10-CM | POA: Diagnosis present

## 2012-12-20 DIAGNOSIS — N179 Acute kidney failure, unspecified: Principal | ICD-10-CM | POA: Diagnosis present

## 2012-12-20 DIAGNOSIS — I272 Pulmonary hypertension, unspecified: Secondary | ICD-10-CM

## 2012-12-20 DIAGNOSIS — Z950 Presence of cardiac pacemaker: Secondary | ICD-10-CM | POA: Diagnosis present

## 2012-12-20 DIAGNOSIS — S78119A Complete traumatic amputation at level between unspecified hip and knee, initial encounter: Secondary | ICD-10-CM

## 2012-12-20 DIAGNOSIS — R269 Unspecified abnormalities of gait and mobility: Secondary | ICD-10-CM | POA: Diagnosis present

## 2012-12-20 DIAGNOSIS — K219 Gastro-esophageal reflux disease without esophagitis: Secondary | ICD-10-CM | POA: Diagnosis present

## 2012-12-20 DIAGNOSIS — I4891 Unspecified atrial fibrillation: Secondary | ICD-10-CM | POA: Diagnosis present

## 2012-12-20 DIAGNOSIS — E785 Hyperlipidemia, unspecified: Secondary | ICD-10-CM | POA: Diagnosis present

## 2012-12-20 DIAGNOSIS — N189 Chronic kidney disease, unspecified: Secondary | ICD-10-CM | POA: Diagnosis present

## 2012-12-20 DIAGNOSIS — I252 Old myocardial infarction: Secondary | ICD-10-CM

## 2012-12-20 DIAGNOSIS — F3289 Other specified depressive episodes: Secondary | ICD-10-CM | POA: Diagnosis present

## 2012-12-20 DIAGNOSIS — Z888 Allergy status to other drugs, medicaments and biological substances status: Secondary | ICD-10-CM

## 2012-12-20 DIAGNOSIS — I959 Hypotension, unspecified: Secondary | ICD-10-CM | POA: Diagnosis present

## 2012-12-20 DIAGNOSIS — A0811 Acute gastroenteropathy due to Norwalk agent: Secondary | ICD-10-CM | POA: Diagnosis present

## 2012-12-20 DIAGNOSIS — Z794 Long term (current) use of insulin: Secondary | ICD-10-CM

## 2012-12-20 DIAGNOSIS — Z885 Allergy status to narcotic agent status: Secondary | ICD-10-CM

## 2012-12-20 DIAGNOSIS — G450 Vertebro-basilar artery syndrome: Secondary | ICD-10-CM | POA: Diagnosis present

## 2012-12-20 DIAGNOSIS — I129 Hypertensive chronic kidney disease with stage 1 through stage 4 chronic kidney disease, or unspecified chronic kidney disease: Secondary | ICD-10-CM | POA: Diagnosis present

## 2012-12-20 DIAGNOSIS — F0390 Unspecified dementia without behavioral disturbance: Secondary | ICD-10-CM | POA: Diagnosis present

## 2012-12-20 DIAGNOSIS — E039 Hypothyroidism, unspecified: Secondary | ICD-10-CM | POA: Diagnosis present

## 2012-12-20 DIAGNOSIS — E860 Dehydration: Secondary | ICD-10-CM | POA: Diagnosis present

## 2012-12-20 DIAGNOSIS — E119 Type 2 diabetes mellitus without complications: Secondary | ICD-10-CM | POA: Diagnosis present

## 2012-12-20 DIAGNOSIS — J40 Bronchitis, not specified as acute or chronic: Secondary | ICD-10-CM | POA: Diagnosis present

## 2012-12-20 DIAGNOSIS — Z89619 Acquired absence of unspecified leg above knee: Secondary | ICD-10-CM

## 2012-12-20 DIAGNOSIS — I509 Heart failure, unspecified: Secondary | ICD-10-CM | POA: Diagnosis present

## 2012-12-20 DIAGNOSIS — I5042 Chronic combined systolic (congestive) and diastolic (congestive) heart failure: Secondary | ICD-10-CM | POA: Diagnosis present

## 2012-12-20 DIAGNOSIS — Z79899 Other long term (current) drug therapy: Secondary | ICD-10-CM

## 2012-12-20 DIAGNOSIS — Z90710 Acquired absence of both cervix and uterus: Secondary | ICD-10-CM

## 2012-12-20 DIAGNOSIS — K3184 Gastroparesis: Secondary | ICD-10-CM | POA: Diagnosis present

## 2012-12-20 DIAGNOSIS — Z8673 Personal history of transient ischemic attack (TIA), and cerebral infarction without residual deficits: Secondary | ICD-10-CM

## 2012-12-20 DIAGNOSIS — N289 Disorder of kidney and ureter, unspecified: Secondary | ICD-10-CM | POA: Diagnosis present

## 2012-12-20 DIAGNOSIS — R509 Fever, unspecified: Secondary | ICD-10-CM

## 2012-12-20 DIAGNOSIS — I739 Peripheral vascular disease, unspecified: Secondary | ICD-10-CM | POA: Diagnosis present

## 2012-12-20 DIAGNOSIS — H5440 Blindness, one eye, unspecified eye: Secondary | ICD-10-CM | POA: Diagnosis present

## 2012-12-20 DIAGNOSIS — Z882 Allergy status to sulfonamides status: Secondary | ICD-10-CM

## 2012-12-20 DIAGNOSIS — Z800 Family history of malignant neoplasm of digestive organs: Secondary | ICD-10-CM

## 2012-12-20 DIAGNOSIS — Z7901 Long term (current) use of anticoagulants: Secondary | ICD-10-CM

## 2012-12-20 DIAGNOSIS — R197 Diarrhea, unspecified: Secondary | ICD-10-CM

## 2012-12-20 DIAGNOSIS — I2510 Atherosclerotic heart disease of native coronary artery without angina pectoris: Secondary | ICD-10-CM | POA: Diagnosis present

## 2012-12-20 DIAGNOSIS — D631 Anemia in chronic kidney disease: Secondary | ICD-10-CM | POA: Diagnosis present

## 2012-12-20 DIAGNOSIS — I10 Essential (primary) hypertension: Secondary | ICD-10-CM | POA: Diagnosis not present

## 2012-12-20 DIAGNOSIS — R112 Nausea with vomiting, unspecified: Secondary | ICD-10-CM | POA: Diagnosis present

## 2012-12-20 LAB — COMPREHENSIVE METABOLIC PANEL
ALT: 14 U/L (ref 0–35)
Albumin: 2.8 g/dL — ABNORMAL LOW (ref 3.5–5.2)
Alkaline Phosphatase: 78 U/L (ref 39–117)
BUN: 62 mg/dL — ABNORMAL HIGH (ref 6–23)
Chloride: 101 mEq/L (ref 96–112)
GFR calc Af Amer: 31 mL/min — ABNORMAL LOW (ref >90)
Glucose, Bld: 108 mg/dL — ABNORMAL HIGH (ref 70–99)
Potassium: 4.8 mEq/L (ref 3.5–5.1)
Sodium: 132 mEq/L — ABNORMAL LOW (ref 135–145)
Total Bilirubin: 0.5 mg/dL (ref 0.3–1.2)

## 2012-12-20 LAB — LIPASE, BLOOD: Lipase: 16 U/L (ref 11–59)

## 2012-12-20 LAB — CBC WITH DIFFERENTIAL/PLATELET
Hemoglobin: 14.3 g/dL (ref 12.0–15.0)
Lymphocytes Relative: 36 % (ref 12–46)
Lymphs Abs: 4.9 10*3/uL — ABNORMAL HIGH (ref 0.7–4.0)
Monocytes Relative: 9 % (ref 3–12)
Neutro Abs: 7.3 10*3/uL (ref 1.7–7.7)
Neutrophils Relative %: 53 % (ref 43–77)
RBC: 4.8 MIL/uL (ref 3.87–5.11)
WBC: 13.7 10*3/uL — ABNORMAL HIGH (ref 4.0–10.5)

## 2012-12-20 LAB — LACTIC ACID, PLASMA: Lactic Acid, Venous: 0.6 mmol/L (ref 0.5–2.2)

## 2012-12-20 MED ORDER — ACETAMINOPHEN 325 MG PO TABS
ORAL_TABLET | ORAL | Status: AC
  Administered 2012-12-20: 625 mg
  Filled 2012-12-20: qty 2

## 2012-12-20 MED ORDER — ONDANSETRON HCL 4 MG/2ML IJ SOLN
4.0000 mg | Freq: Once | INTRAMUSCULAR | Status: AC
  Administered 2012-12-20: 4 mg via INTRAVENOUS
  Filled 2012-12-20: qty 2

## 2012-12-20 MED ORDER — SODIUM CHLORIDE 0.9 % IV BOLUS (SEPSIS)
500.0000 mL | Freq: Once | INTRAVENOUS | Status: AC
  Administered 2012-12-20: 500 mL via INTRAVENOUS

## 2012-12-20 MED ORDER — ACETAMINOPHEN 325 MG PO TABS: 650.0000 mg | ORAL_TABLET | Freq: Once | ORAL | Status: DC

## 2012-12-20 NOTE — ED Notes (Signed)
Cough dx with bronchitis,  Vomiting, diarrhea.  Seen here 1 week ago. cough

## 2012-12-20 NOTE — ED Notes (Signed)
Pt complaining of cough and congestion, was seen several days ago and states that she was told she had bronchitis. States cough has gotten worse, also notes n/v/d over last 3 days. Pt is aax4, hard of hearing. Doughter in room to help with assessment.

## 2012-12-20 NOTE — ED Notes (Signed)
Dr Dierdre Highman notified of pts blood pressure, order for 500cc bolus obtained

## 2012-12-20 NOTE — ED Provider Notes (Signed)
History  This chart was scribed for Sunnie Nielsen, MD by Bennett Scrape, ED Scribe. This patient was seen in room APA02/APA02 and the patient's care was started at 10:54 PM.  CSN: [id number]  Arrival date & time 12/20/12  2033   First MD Initiated Contact with Patient 12/20/12 2254      Chief Complaint  Patient presents with  . Emesis     Patient is a 79 y.o. female presenting with vomiting. The history is provided by the patient and a relative. No language interpreter was used.  Emesis Severity:  Moderate Duration:  3 days Timing:  Constant Emesis appearance: yellow. Progression:  Unchanged Chronicity:  New Context: not post-tussive   Relieved by:  Nothing Worsened by:  Nothing tried Ineffective treatments:  None tried Associated symptoms: cough, diarrhea and fever   Associated symptoms: no abdominal pain and no chills     Brittney [name] is a 79 y.o. female who presents to the Emergency Department complaining of 3 days of constant non-bloody emesis with associated nausea, diarrhea and 2 weeks of cough. Fever is 101.4 in the ED, but pt denies any known fevers at home. Daughter states that she has been unable to keep down her daily medications and reports possible weight loss stating that she hasn't weighed the pt. Daughter reports that she was seen one week ago and was diagnosed with bronchitis. Since then CBGs have been elevated while on steroids for the bronchitis. Daughter reports several sick contacts at home with cough and diarrhea. Pt denies any hematochezia, hematemesis, abdominal pain, CP, new back pain and SOB as associated symptoms. She also has a h/o HTN, CAD, TIA and GERD. Pt denies smoking and alcohol use.   Dr. Rosetta Posner in [place] is PCP  Past Medical History  Diagnosis Date  . Hypertension   . Dyslipidemia   . CAD (coronary artery disease)     Non-STEMI August, 2010,,,, 40% left main, 30% LAD, 60% OM 2, 40% RCA, 90% OM1-culprit lesion-too small for intervention            . Ejection fraction     EF 55% ,2011 / EF 30% with tachycardia cardiomyopathy / EF 50% January, 2012  . Diabetes mellitus   . Dementia     ?? Early dementia ?? 2012  . GERD (gastroesophageal reflux disease)   . TIA (transient ischemic attack)     History of TIAs  . Hypothyroidism   . PAD (peripheral artery disease)     Carotid endarterectomy, right. Stents right peroneal, right superficial, right popliteal arteries, Dr. Edilia Bo  . Amputation     Right midfoot 2010  . Gait disorder   . Vertebrobasilar insufficiency   . Depression   . Hyperkalemia     ACE Inhibitor held  . Blindness of right eye   . Respiratory failure     Hypoxic and hypercapnic, 2011, etiology pneumonia  . Orthostatic hypotension     Syncope January 2012 with facial trauma, amiodarone stopped  . Atrial fibrillation 8/10    on pradaxa, s/p AV nodal ablation  . Ventricular tachycardia     20 beats December, 2012, asymptomatic  . Atrial flutter 05/24/09    Hospitalization in [place]; with difficult rate control; TEE cardioversion done EF 30%, secondary to tachycardia, pt did convert back tosinus rhythm; return 10/11, amiodarone restarted 10/11, no longer on it. Recurrence with RVR 08/2011.  [name] Anemia in CKD (chronic kidney disease)   . Gastroparesis 3/11    Abnormal GES  .  Drug therapy,  Sotolol     Sotolol started 08/2011.  . Drug therapy     Pradaxa January, 2013  . Complete heart block     s/p AV nodal ablation  . Chronic diastolic CHF (congestive heart failure)     echo 03/2012- LVEF 55-60%, grade 2 diastolic dysfunction, mild LA dilatation, mild MR, paradoxical vent septal motion  . On home O2     prn  . PONV (postoperative nausea and vomiting)   . S/P AKA (above knee amputation) unilateral     Left AKA October, 2013  . Pacemaker   . Pulmonary hypertension     PA pressure 45 mm of mercury in the past,   //    PA pressure 73 mmHg echo, January, 2014, when patient admitted wit diastolic CHF and  ejection fraction down to 40%  . CKD (chronic kidney disease)     Creatinine 1.19 at December 2012 discharge  . Acute renal insufficiency     Catheterization August 2010    Past Surgical History  Procedure Laterality Date  . Total abdominal hysterectomy    . Cholecystectomy    . Carotid endarterectomy    . Amputation  2010    RIGHT MIDFOOT  . Tonsillectomy    . Eye surgeries      Several right  . Pacemaker insertion      SJM Pacemaker implant 9/10  . Pci stent to the right peroneal and right superficial as well as right popliteal arteries      Dr. Edilia Bo  . Av nodal ablation  08/2011    by Dr Ladona Ridgel  . I&d extremity  05/28/2012    Procedure: IRRIGATION AND DEBRIDEMENT EXTREMITY;  Surgeon: Sherren Kerns, MD;  Location: [facility] OR;  Service: Vascular;  Laterality: Left;  possible amputation toes  . Amputation  07/02/2012    Procedure: AMPUTATION ABOVE KNEE;  Surgeon: Nadara Mustard, MD;  Location: [facility] OR;  Service: Orthopedics;  Laterality: Left;  Left Above Knee Amputation    Family History  Problem Relation Age of Onset  . Cancer Brother     Colon cancer  . Aortic aneurysm Mother   . Colon polyps Neg Hx   . Liver disease Neg Hx     And no CRC    History  Substance Use Topics  . Smoking status: Never Smoker   . Smokeless tobacco: Never Used  . Alcohol Use: No    No OB history provided.  Review of Systems  Constitutional: Positive for fever. Negative for chills.  Cardiovascular: Negative for chest pain.  Gastrointestinal: Positive for nausea, vomiting and diarrhea. Negative for abdominal pain and blood in stool.  Musculoskeletal: Back pain: chronic.  Skin: Negative for rash.  All other systems reviewed and are negative.     Allergies  Amiodarone; Morphine sulfate; Oxycodone hcl; and Sulfa antibiotics  Home Medications   Current Outpatient Rx  Name  Route  Sig  Dispense  Refill  . acetaminophen (TYLENOL) 500 MG tablet   Oral   Take 1,000 mg by mouth 3  (three) times daily as needed. For pain         . amoxicillin (AMOXIL) 500 MG capsule   Oral   Take 1 capsule (500 mg total) by mouth 3 (three) times daily.   21 capsule   0   . apixaban (ELIQUIS) 5 MG TABS tablet   Oral   Take 1 tablet (5 mg total) by mouth 2 (two) times daily.  60 tablet   6   . docusate sodium (COLACE) 100 MG capsule   Oral   Take 100 mg by mouth 2 (two) times daily.         . furosemide (LASIX) 40 MG tablet   Oral   Take 1.5 tablets (60 mg total) by mouth daily.   45 tablet   6   . HYDROcodone-acetaminophen (NORCO) 5-325 MG per tablet   Oral   Take 1 tablet by mouth every 6 (six) hours as needed for pain.   30 tablet   0   . insulin aspart (NOVOLOG) 100 UNIT/ML injection   Subcutaneous   Inject 0-10 Units into the skin 4 (four) times daily -  before meals and at bedtime. Per sliding scale: 0-150=0 units; 151-200=2 units; 201-250=4 units; 251-300=6 units; 301-350=8 units; >350=10 units; >450=CALL MD         . insulin glargine (LANTUS) 100 UNIT/ML injection   Subcutaneous   Inject 10 Units into the skin at bedtime.   10 mL      . levothyroxine (SYNTHROID, LEVOTHROID) 88 MCG tablet   Oral   Take 1 tablet (88 mcg total) by mouth daily before breakfast.         . metoprolol tartrate (LOPRESSOR) 25 MG tablet   Oral   Take 25 mg by mouth 2 (two) times daily.         . ondansetron (ZOFRAN-ODT) 8 MG disintegrating tablet   Oral   Take 8 mg by mouth every 4 (four) hours as needed. 8mg  ODT q4 hours prn nausea         . potassium chloride (K-DUR) 10 MEQ tablet   Oral   Take 20 mEq by mouth 2 (two) times daily.         . sertraline (ZOLOFT) 100 MG tablet   Oral   Take 100 mg by mouth daily.          . travoprost, benzalkonium, (TRAVATAN) 0.004 % ophthalmic solution   Both Eyes   Place 1 drop into both eyes at bedtime.          . vitamin C (ASCORBIC ACID) 500 MG tablet   Oral   Take 1,000 mg by mouth 2 (two) times daily.          . predniSONE (DELTASONE) 10 MG tablet      5,4,3,2,1 - take with food           Triage Vitals: BP 85/35  Pulse 70  Temp(Src) 99.5 F (37.5 C) (Oral)  Resp 20  Ht 5\' 7"  (1.702 m)  Wt 137 lb (62.143 kg)  BMI 21.45 kg/m2  SpO2 92%  Physical Exam  Nursing note and vitals reviewed. Constitutional: She is oriented to person, place, and time. She appears well-developed and well-nourished. No distress.  HENT:  Head: Normocephalic and atraumatic.  Dry MM  Eyes: Conjunctivae and EOM are normal.  Neck: Neck supple. No tracheal deviation present.  Cardiovascular: Normal rate and regular rhythm.   No murmur heard. Pulmonary/Chest: Effort normal and breath sounds normal. No respiratory distress.  Hypotenisve  Abdominal: Soft. There is no tenderness.  Musculoskeletal: Normal range of motion.  Left AKA, amputation of digits on the right foot  Neurological: She is alert and oriented to person, place, and time.  Skin: Skin is warm and dry.  Psychiatric: She has a normal mood and affect. Her behavior is normal.    ED Course  Procedures (including critical care time)  DIAGNOSTIC  STUDIES: Oxygen Saturation is 97% on room air, normal by my interpretation.    COORDINATION OF CARE: 11:02 PM-BP is 83/36 (hypotensive). Discussed treatment plan which includes IV fluids, antiemetic, tylenol, CXR, CBC panel, CMP, and UA with pt at bedside and pt agreed to plan.   Results for orders placed during the hospital encounter of 12/20/12  CBC WITH DIFFERENTIAL      Result Value Range   WBC 13.7 (*) 4.0 - 10.5 K/uL   RBC 4.80  3.87 - 5.11 MIL/uL   Hemoglobin 14.3  12.0 - 15.0 g/dL   HCT 16.1  09.6 - 04.5 %   MCV 88.3  78.0 - 100.0 fL   MCH 29.8  26.0 - 34.0 pg   MCHC 33.7  30.0 - 36.0 g/dL   RDW 40.9  81.1 - 91.4 %   Platelets 338  150 - 400 K/uL   Neutrophils Relative 53  43 - 77 %   Neutro Abs 7.3  1.7 - 7.7 K/uL   Lymphocytes Relative 36  12 - 46 %   Lymphs Abs 4.9 (*) 0.7 - 4.0  K/uL   Monocytes Relative 9  3 - 12 %   Monocytes Absolute 1.3 (*) 0.1 - 1.0 K/uL   Eosinophils Relative 2  0 - 5 %   Eosinophils Absolute 0.2  0.0 - 0.7 K/uL   Basophils Relative 0  0 - 1 %   Basophils Absolute 0.0  0.0 - 0.1 K/uL  COMPREHENSIVE METABOLIC PANEL      Result Value Range   Sodium 132 (*) 135 - 145 mEq/L   Potassium 4.8  3.5 - 5.1 mEq/L   Chloride 101  96 - 112 mEq/L   CO2 20  19 - 32 mEq/L   Glucose, Bld 108 (*) 70 - 99 mg/dL   BUN 62 (*) 6 - 23 mg/dL   Creatinine, Ser 7.82 (*) 0.50 - 1.10 mg/dL   Calcium 8.3 (*) 8.4 - 10.5 mg/dL   Total Protein 6.5  6.0 - 8.3 g/dL   Albumin 2.8 (*) 3.5 - 5.2 g/dL   AST 16  0 - 37 U/L   ALT 14  0 - 35 U/L   Alkaline Phosphatase 78  39 - 117 U/L   Total Bilirubin 0.5  0.3 - 1.2 mg/dL   GFR calc non Af Amer 27 (*) >90 mL/min   GFR calc Af Amer 31 (*) >90 mL/min  LIPASE, BLOOD      Result Value Range   Lipase 16  11 - 59 U/L  LACTIC ACID, PLASMA      Result Value Range   Lactic Acid, Venous 0.6  0.5 - 2.2 mmol/L   Dg Chest Portable 1 View  12/20/2012  *RADIOLOGY REPORT*  Clinical Data: Weakness and vomiting.  PORTABLE CHEST - 1 VIEW  Comparison: Chest radiograph performed 12/13/2012  Findings: The lungs are well-aerated.  Mild peribronchial thickening is noted, with mild chronically increased interstitial markings seen.  There is no evidence of focal opacification, pleural effusion or pneumothorax.  The cardiomediastinal silhouette is borderline enlarged.  A pacemaker is seen overlying the left chest wall, with leads ending overlying the right atrium and right ventricle.  No acute osseous abnormalities are seen.  IMPRESSION: Mild peribronchial thickening noted, with mild chronic lung changes seen.  No acute focal airspace consolidation seen.  Borderline cardiomegaly.   Original Report Authenticated By: Tonia Ghent, M.D.    Dg Chest Port 1 View  12/13/2012  *RADIOLOGY REPORT*  Clinical Data: Cough and wheezing.  PORTABLE CHEST - 1 VIEW   Comparison: Chest radiograph performed 10/03/2012  Findings: The lungs are well-aerated.  Previously noted vascular congestion has improved.  Minimal bilateral atelectasis is seen. There is no evidence of pleural effusion or pneumothorax.  The cardiomediastinal silhouette is mildly enlarged.  Calcification is noted along the aortic arch.  A pacemaker is seen overlying the left chest wall, with leads ending overlying the right atrium and right ventricle.  No acute osseous abnormalities are seen.  IMPRESSION: Interval improvement in vascular congestion; persistent mild cardiomegaly.  Minimal bilateral atelectasis seen; lungs otherwise clear.   Original Report Authenticated By: Tonia Ghent, M.D.     500cc IVF bolus x 2, remains hypotensive  12:13 AM d/w DR Phillips Odor plan MED admit, cont gentle hydration for now  MDM  N/V/D with fever and dehydration/ ARI  IVF resus, tylenol  Labs, imaging  MED admit  I personally performed the services described in this documentation, which was scribed in my presence. The recorded information has been reviewed and is accurate.     Sunnie Nielsen, MD 12/21/12 [phone]

## 2012-12-21 ENCOUNTER — Inpatient Hospital Stay (HOSPITAL_COMMUNITY): Payer: Medicare Other

## 2012-12-21 ENCOUNTER — Encounter (HOSPITAL_COMMUNITY): Payer: Self-pay | Admitting: Internal Medicine

## 2012-12-21 DIAGNOSIS — I4891 Unspecified atrial fibrillation: Secondary | ICD-10-CM

## 2012-12-21 DIAGNOSIS — I959 Hypotension, unspecified: Secondary | ICD-10-CM | POA: Diagnosis present

## 2012-12-21 DIAGNOSIS — R112 Nausea with vomiting, unspecified: Secondary | ICD-10-CM | POA: Diagnosis present

## 2012-12-21 DIAGNOSIS — J40 Bronchitis, not specified as acute or chronic: Secondary | ICD-10-CM | POA: Diagnosis present

## 2012-12-21 DIAGNOSIS — E860 Dehydration: Secondary | ICD-10-CM | POA: Diagnosis present

## 2012-12-21 DIAGNOSIS — R197 Diarrhea, unspecified: Secondary | ICD-10-CM

## 2012-12-21 LAB — GLUCOSE, CAPILLARY
Glucose-Capillary: 300 mg/dL — ABNORMAL HIGH (ref 70–99)
Glucose-Capillary: 373 mg/dL — ABNORMAL HIGH (ref 70–99)
Glucose-Capillary: 178 mg/dL — ABNORMAL HIGH (ref 70–99)

## 2012-12-21 LAB — MRSA PCR SCREENING: MRSA by PCR: POSITIVE — AB

## 2012-12-21 LAB — BASIC METABOLIC PANEL
BUN: 56 mg/dL — ABNORMAL HIGH (ref 6–23)
CO2: 23 mEq/L (ref 19–32)
Chloride: 99 mEq/L (ref 96–112)
GFR calc Af Amer: 37 mL/min — ABNORMAL LOW (ref >90)
Potassium: 5.4 mEq/L — ABNORMAL HIGH (ref 3.5–5.1)

## 2012-12-21 LAB — URINALYSIS, ROUTINE W REFLEX MICROSCOPIC
Bilirubin Urine: NEGATIVE
Hgb urine dipstick: NEGATIVE
Ketones, ur: NEGATIVE mg/dL
Nitrite: NEGATIVE
pH: 5.5 (ref 5.0–8.0)

## 2012-12-21 LAB — MAGNESIUM: Magnesium: 1.7 mg/dL (ref 1.5–2.5)

## 2012-12-21 LAB — CBC
HCT: 40.4 % (ref 36.0–46.0)
MCV: 88.6 fL (ref 78.0–100.0)
Platelets: 277 10*3/uL (ref 150–400)
RBC: 4.56 MIL/uL (ref 3.87–5.11)
RDW: 13.7 % (ref 11.5–15.5)
WBC: 12.2 10*3/uL — ABNORMAL HIGH (ref 4.0–10.5)

## 2012-12-21 MED ORDER — ALBUTEROL SULFATE (5 MG/ML) 0.5% IN NEBU: 2.5000 mg | INHALATION_SOLUTION | RESPIRATORY_TRACT | Status: DC | PRN

## 2012-12-21 MED ORDER — METOCLOPRAMIDE HCL 5 MG/ML IJ SOLN: 5.0000 mg | Freq: Three times a day (TID) | INTRAMUSCULAR | Status: DC | PRN

## 2012-12-21 MED ORDER — SODIUM CHLORIDE 0.9 % IV SOLN
INTRAVENOUS | Status: AC
  Administered 2012-12-21 (×2): via INTRAVENOUS

## 2012-12-21 MED ORDER — INSULIN ASPART 100 UNIT/ML ~~LOC~~ SOLN
0.0000 [IU] | Freq: Three times a day (TID) | SUBCUTANEOUS | Status: DC
  Administered 2012-12-21 (×2): 9 [IU] via SUBCUTANEOUS
  Administered 2012-12-22: 5 [IU] via SUBCUTANEOUS
  Administered 2012-12-22: 7 [IU] via SUBCUTANEOUS
  Administered 2012-12-22: 5 [IU] via SUBCUTANEOUS

## 2012-12-21 MED ORDER — MORPHINE SULFATE 2 MG/ML IJ SOLN: 2.0000 mg | INTRAMUSCULAR | Status: DC | PRN

## 2012-12-21 MED ORDER — INSULIN GLARGINE 100 UNIT/ML ~~LOC~~ SOLN
10.0000 [IU] | Freq: Every day | SUBCUTANEOUS | Status: DC
  Administered 2012-12-21: 10 [IU] via SUBCUTANEOUS
  Filled 2012-12-21 (×2): qty 0.1

## 2012-12-21 MED ORDER — SODIUM CHLORIDE 0.9 % IJ SOLN: 3.0000 mL | Freq: Two times a day (BID) | INTRAMUSCULAR | Status: DC

## 2012-12-21 MED ORDER — MAGNESIUM SULFATE IN D5W 10-5 MG/ML-% IV SOLN
1.0000 g | Freq: Once | INTRAVENOUS | Status: AC
  Administered 2012-12-21: 1 g via INTRAVENOUS
  Filled 2012-12-21: qty 100

## 2012-12-21 MED ORDER — DOXYCYCLINE HYCLATE 100 MG IV SOLR
100.0000 mg | Freq: Two times a day (BID) | INTRAVENOUS | Status: DC
  Administered 2012-12-21 – 2012-12-22 (×3): 100 mg via INTRAVENOUS
  Filled 2012-12-21 (×5): qty 100

## 2012-12-21 MED ORDER — MUPIROCIN 2 % EX OINT
1.0000 "application " | TOPICAL_OINTMENT | Freq: Two times a day (BID) | CUTANEOUS | Status: DC
  Administered 2012-12-21 – 2012-12-22 (×3): 1 via NASAL
  Filled 2012-12-21: qty 22

## 2012-12-21 MED ORDER — CHLORHEXIDINE GLUCONATE CLOTH 2 % EX PADS
6.0000 | MEDICATED_PAD | Freq: Every day | CUTANEOUS | Status: DC
  Administered 2012-12-21 – 2012-12-22 (×2): 6 via TOPICAL

## 2012-12-21 MED ORDER — LOPERAMIDE HCL 2 MG PO CAPS
2.0000 mg | ORAL_CAPSULE | ORAL | Status: DC | PRN
  Administered 2012-12-21: 2 mg via ORAL
  Filled 2012-12-21: qty 1

## 2012-12-21 MED ORDER — METOPROLOL TARTRATE 25 MG PO TABS
25.0000 mg | ORAL_TABLET | Freq: Two times a day (BID) | ORAL | Status: DC
  Administered 2012-12-21 – 2012-12-22 (×3): 25 mg via ORAL
  Filled 2012-12-21 (×3): qty 1

## 2012-12-21 MED ORDER — SODIUM CHLORIDE 0.9 % IV BOLUS (SEPSIS)
1000.0000 mL | Freq: Once | INTRAVENOUS | Status: AC
  Administered 2012-12-21: 1000 mL via INTRAVENOUS

## 2012-12-21 MED ORDER — LEVOTHYROXINE SODIUM 88 MCG PO TABS
88.0000 ug | ORAL_TABLET | Freq: Every day | ORAL | Status: DC
  Administered 2012-12-22: 88 ug via ORAL
  Filled 2012-12-21 (×4): qty 1

## 2012-12-21 MED ORDER — APIXABAN 5 MG PO TABS
5.0000 mg | ORAL_TABLET | Freq: Two times a day (BID) | ORAL | Status: DC
  Administered 2012-12-21 – 2012-12-22 (×4): 5 mg via ORAL
  Filled 2012-12-21 (×7): qty 1

## 2012-12-21 MED ORDER — APIXABAN 5 MG PO TABS
ORAL_TABLET | ORAL | Status: AC
  Filled 2012-12-21: qty 1

## 2012-12-21 MED ORDER — MAGNESIUM SULFATE IN D5W 10-5 MG/ML-% IV SOLN
INTRAVENOUS | Status: AC
  Filled 2012-12-21: qty 100

## 2012-12-21 MED ORDER — ACETAMINOPHEN 325 MG PO TABS: 650.0000 mg | ORAL_TABLET | Freq: Four times a day (QID) | ORAL | Status: DC | PRN

## 2012-12-21 MED ORDER — ONDANSETRON HCL 4 MG/2ML IJ SOLN: 4.0000 mg | Freq: Four times a day (QID) | INTRAMUSCULAR | Status: DC | PRN

## 2012-12-21 MED ORDER — ONDANSETRON HCL 4 MG PO TABS: 4.0000 mg | ORAL_TABLET | Freq: Four times a day (QID) | ORAL | Status: DC | PRN

## 2012-12-21 MED ORDER — HYDROCORTISONE SOD SUCCINATE 100 MG IJ SOLR
50.0000 mg | Freq: Two times a day (BID) | INTRAMUSCULAR | Status: DC
  Administered 2012-12-21 – 2012-12-22 (×3): 50 mg via INTRAVENOUS
  Filled 2012-12-21 (×3): qty 2

## 2012-12-21 MED ORDER — TRAVOPROST (BAK FREE) 0.004 % OP SOLN
1.0000 [drp] | Freq: Every day | OPHTHALMIC | Status: DC
  Filled 2012-12-21: qty 2.5

## 2012-12-21 MED ORDER — INSULIN ASPART 100 UNIT/ML ~~LOC~~ SOLN: 0.0000 [IU] | Freq: Every day | SUBCUTANEOUS | Status: DC

## 2012-12-21 MED ORDER — SERTRALINE HCL 50 MG PO TABS
100.0000 mg | ORAL_TABLET | Freq: Every day | ORAL | Status: DC
  Administered 2012-12-21 – 2012-12-22 (×2): 100 mg via ORAL
  Filled 2012-12-21: qty 1
  Filled 2012-12-21: qty 2
  Filled 2012-12-21: qty 1

## 2012-12-21 MED ORDER — DOXYCYCLINE HYCLATE 100 MG IV SOLR
INTRAVENOUS | Status: AC
  Filled 2012-12-21: qty 100

## 2012-12-21 MED ORDER — ACETAMINOPHEN 650 MG RE SUPP: 650.0000 mg | Freq: Four times a day (QID) | RECTAL | Status: DC | PRN

## 2012-12-21 NOTE — ED Notes (Signed)
Dr Phillips Odor in room assessing pt at this time

## 2012-12-21 NOTE — H&P (Signed)
[facility]  History and Physical  Brittney [name]:[id number] DOB: 09-02-1932 DOA: 12/20/2012  Referring physician: Luciano Cutter  PCP: Toma Deiters, MD  Specialists: none  Chief Complaint: Nausea, Vomiting, Diarrhea  HPI: Brittney [name] is a 79 y.o. female with multiple chronic medical problems including ASCVD, diabetes, chronic kidney disease, peripheral artery disease status post above-the-knee amputation and congestive heart failure who presented to the emergency department with a 3 day history of nausea vomiting and diarrhea for which she has been unable to maintain adequate oral intake. Approximately 7 days ago she was also seen in the emergency department for an upper respiratory infection with cough and was empirically treated with amoxicillin and prednisone for bronchitis. She has been progressively weaker and more confused per her daughter who is at the bedside. Notably there are family members who have had GI viruses And URI's recently.  In the ED she was found to be clinically dehydrated and hypotensive, admission requested for further evaluation and management of her hypotension, volume depletion and possible URI/GI infection.  Review of Systems: Unable to obtain from patient, she is unable to communcate due to dementia, weakness and no hearing aids.  Past Medical History   Diagnosis  Date   .  Hypertension    .  Dyslipidemia    .  CAD (coronary artery disease)      Non-STEMI August, 2010,,,, 40% left main, 30% LAD, 60% OM 2, 40% RCA, 90% OM1-culprit lesion-too small for intervention   .  Ejection fraction      EF 55% ,2011 / EF 30% with tachycardia cardiomyopathy / EF 50% January, 2012   .  Diabetes mellitus    .  Dementia      ?? Early dementia ?? 2012   .  GERD (gastroesophageal reflux disease)    .  TIA (transient ischemic attack)      History of TIAs   .  Hypothyroidism    .  PAD (peripheral artery disease)      Carotid endarterectomy, right. Stents right peroneal,  right superficial, right popliteal arteries, Dr. Edilia Bo   .  Amputation      Right midfoot 2010   .  Gait disorder    .  Vertebrobasilar insufficiency    .  Depression    .  Hyperkalemia      ACE Inhibitor held   .  Blindness of right eye    .  Respiratory failure      Hypoxic and hypercapnic, 2011, etiology pneumonia   .  Orthostatic hypotension      Syncope January 2012 with facial trauma, amiodarone stopped   .  Atrial fibrillation  8/10     on pradaxa, s/p AV nodal ablation   .  Ventricular tachycardia      20 beats December, 2012, asymptomatic   .  Atrial flutter  05/24/09     Hospitalization in [place]; with difficult rate control; TEE cardioversion done EF 30%, secondary to tachycardia, pt did convert back tosinus rhythm; return 10/11, amiodarone restarted 10/11, no longer on it. Recurrence with RVR 08/2011.   [name]  Anemia in CKD (chronic kidney disease)    .  Gastroparesis  3/11     Abnormal GES   .  Drug therapy, Sotolol      Sotolol started 08/2011.   .  Drug therapy      Pradaxa January, 2013   .  Complete heart block      s/p AV nodal ablation   .  Chronic diastolic CHF (congestive heart failure)      echo 03/2012- LVEF 55-60%, grade 2 diastolic dysfunction, mild LA dilatation, mild MR, paradoxical vent septal motion   .  On home O2      prn   .  PONV (postoperative nausea and vomiting)    .  S/P AKA (above knee amputation) unilateral      Left AKA October, 2013   .  Pacemaker    .  Pulmonary hypertension      PA pressure 45 mm of mercury in the past, // PA pressure 73 mmHg echo, January, 2014, when patient admitted wit diastolic CHF and ejection fraction down to 40%   .  CKD (chronic kidney disease)      Creatinine 1.19 at December 2012 discharge   .  Acute renal insufficiency      Catheterization August 2010   .  Atrial flutter  05/24/2009     Hospitalization in [place]; with difficult rate control; TEE cardioversion done EF 30%, secondary to tachycardia, pt  did convert back tosinus rhythm; return 10/11, amiodarone restarted 10/11, Stopped for autonomic dysfunction-fall / Recurrent atrial flutter, hospital, December, 2012, sotalol started, used 80 mg by mouth once daily because of renal function / Recurrent atrial fibrillation, J    Past Surgical History   Procedure  Laterality  Date   .  Total abdominal hysterectomy     .  Cholecystectomy     .  Carotid endarterectomy     .  Amputation   2010     RIGHT MIDFOOT   .  Tonsillectomy     .  Eye surgeries       Several right   .  Pacemaker insertion       SJM Pacemaker implant 9/10   .  Pci stent to the right peroneal and right superficial as well as right popliteal arteries       Dr. Edilia Bo   .  Av nodal ablation   08/2011     by Dr Ladona Ridgel   .  I&d extremity   05/28/2012     Procedure: IRRIGATION AND DEBRIDEMENT EXTREMITY; Surgeon: Sherren Kerns, MD; Location: [facility] OR; Service: Vascular; Laterality: Left; possible amputation toes   .  Amputation   07/02/2012     Procedure: AMPUTATION ABOVE KNEE; Surgeon: Nadara Mustard, MD; Location: [facility] OR; Service: Orthopedics; Laterality: Left; Left Above Knee Amputation    Social History: reports that she has never smoked. She has never used smokeless tobacco. She reports that she does not drink alcohol or use illicit drugs.  Allergies   Allergen  Reactions   .  Amiodarone      Autonomic dysfunction   .  Morphine Sulfate      REACTION: jerking   .  Oxycodone Hcl  Itching and Other (See Comments)     Feels weird   .  Sulfa Antibiotics  Other (See Comments)     unknown    Family History   Problem  Relation  Age of Onset   .  Cancer  Brother      Colon cancer   .  Aortic aneurysm  Mother    .  Colon polyps  Neg Hx    .  Liver disease  Neg Hx      And no CRC    Prior to Admission medications   Medication  Sig  Start Date  End Date  Taking?  Authorizing Provider   acetaminophen (TYLENOL)  500 MG tablet  Take 1,000 mg by mouth 3 (three) times daily  as needed. For pain    Yes  Historical Provider, MD   amoxicillin (AMOXIL) 500 MG capsule  Take 1 capsule (500 mg total) by mouth 3 (three) times daily.  12/13/12   Yes  [name] Dike, PA-C   apixaban (ELIQUIS) 5 MG TABS tablet  Take 1 tablet (5 mg total) by mouth 2 (two) times daily.  10/27/12   Yes  Hillis Range, MD   docusate sodium (COLACE) 100 MG capsule  Take 100 mg by mouth 2 (two) times daily.    Yes  Historical Provider, MD   furosemide (LASIX) 40 MG tablet  Take 1.5 tablets (60 mg total) by mouth daily.  10/20/12   Yes  Prescott Parma, PA-C   HYDROcodone-acetaminophen (NORCO) 5-325 MG per tablet  Take 1 tablet by mouth every 6 (six) hours as needed for pain.  07/05/12   Yes  Nadara Mustard, MD   insulin aspart (NOVOLOG) 100 UNIT/ML injection  Inject 0-10 Units into the skin 4 (four) times daily - before meals and at bedtime. Per sliding scale: 0-150=0 units; 151-200=2 units; 201-250=4 units; 251-300=6 units; 301-350=8 units; >350=10 units; >450=CALL MD    Yes  Historical Provider, MD   insulin glargine (LANTUS) 100 UNIT/ML injection  Inject 10 Units into the skin at bedtime.  10/06/12   Yes  Erick Blinks, MD   levothyroxine (SYNTHROID, LEVOTHROID) 88 MCG tablet  Take 1 tablet (88 mcg total) by mouth daily before breakfast.  06/08/12   Yes  Leroy Sea, MD   metoprolol tartrate (LOPRESSOR) 25 MG tablet  Take 25 mg by mouth 2 (two) times daily.    Yes  Historical Provider, MD   ondansetron (ZOFRAN-ODT) 8 MG disintegrating tablet  Take 8 mg by mouth every 4 (four) hours as needed. 8mg  ODT q4 hours prn nausea  06/26/12   Yes  Hurman Horn, MD   potassium chloride (K-DUR) 10 MEQ tablet  Take 20 mEq by mouth 2 (two) times daily.    Yes  Historical Provider, MD   sertraline (ZOLOFT) 100 MG tablet  Take 100 mg by mouth daily.    Yes  Historical Provider, MD   travoprost, benzalkonium, (TRAVATAN) 0.004 % ophthalmic solution  Place 1 drop into both eyes at bedtime.    Yes  Historical Provider, MD    vitamin C (ASCORBIC ACID) 500 MG tablet  Take 1,000 mg by mouth 2 (two) times daily.    Yes  Historical Provider, MD   predniSONE (DELTASONE) 10 MG tablet  5,4,3,2,1 - take with food  12/13/12    [name] Dike, PA-C   Physical Exam:  Filed Vitals:    12/21/12 0200  12/21/12 0304  12/21/12 0400  12/21/12 0500   BP:  85/37  91/43  91/38  97/39   Pulse:  70  70  70  70   Temp:   97.7 F (36.5 C)     TempSrc:   Oral     Resp:  18   16  15    Height:   5\' 7"  (1.702 m)     Weight:   64.2 kg (141 lb 8.6 oz)     SpO2:  97%  100%  98%  98%    General: Chronically ill appearing, pale, awakes with light touch  Eyes: normal  ENT: dry MM  Neck: supple  Cardiovascular: SEM 2/6 Irregular rhythm  Respiratory: scattered  wheeze, good air movement  Abdomen: soft, but guards on palpation of right lower quadrant, active BS  Skin: dry, trace edema LE  Musculoskeletal: left leg AKA, well healed, right partial foot amputation  Psychiatric: difficult to assess  Neurologic: non-focal, global lethargy Labs on Admission:  Basic Metabolic Panel:   Recent Labs  Lab  12/20/12 2220  12/21/12 0309   NA  132*  --   K  4.8  --   CL  101  --   CO2  20  --   GLUCOSE  108*  --   BUN  62*  --   CREATININE  1.72*  --   CALCIUM  8.3*  --   MG  --  1.7    Liver Function Tests:   Recent Labs  Lab  12/20/12 2220   AST  16   ALT  14   ALKPHOS  78   BILITOT  0.5   PROT  6.5   ALBUMIN  2.8*     Recent Labs  Lab  12/20/12 2244   LIPASE  16    No results found for this basename: AMMONIA, in the last 168 hours  CBC:   Recent Labs  Lab  12/20/12 2220   WBC  13.7*   NEUTROABS  7.3   HGB  14.3   HCT  42.4   MCV  88.3   PLT  338    Cardiac Enzymes:   Recent Labs  Lab  12/21/12 0309   TROPONINI  <0.30    BNP (last 3 results)   Recent Labs   09/24/12 1750  10/03/12 1752  12/21/12 0309   PROBNP  6226.0*  8067.0*  542.5*    CBG:  No results found for this basename: GLUCAP, in the  last 168 hours  Radiological Exams on Admission:  Dg Chest Portable 1 View  12/20/2012 *RADIOLOGY REPORT* Clinical Data: Weakness and vomiting. PORTABLE CHEST - 1 VIEW Comparison: Chest radiograph performed 12/13/2012 Findings: The lungs are well-aerated. Mild peribronchial thickening is noted, with mild chronically increased interstitial markings seen. There is no evidence of focal opacification, pleural effusion or pneumothorax. The cardiomediastinal silhouette is borderline enlarged. A pacemaker is seen overlying the left chest wall, with leads ending overlying the right atrium and right ventricle. No acute osseous abnormalities are seen. IMPRESSION: Mild peribronchial thickening noted, with mild chronic lung changes seen. No acute focal airspace consolidation seen. Borderline cardiomegaly. Original Report Authenticated By: Tonia Ghent, M.D.  Dg Abd Portable 1v  12/21/2012 *RADIOLOGY REPORT* Clinical Data: Nausea, vomiting and diarrhea; right lower quadrant tenderness. PORTABLE ABDOMEN - 1 VIEW Comparison: CT of the abdomen and pelvis performed 06/26/2012, and lumbar spine radiographs performed 05/21/2012 Findings: The visualized bowel gas pattern is unremarkable. Scattered air and stool filled loops of colon are seen; no abnormal dilatation of small bowel loops is seen to suggest small bowel obstruction. No free intra-abdominal air is identified, though evaluation for free air is limited on supine views. The visualized osseous structures are within normal limits; the sacroiliac joints are unremarkable in appearance. The visualized lung bases are essentially clear. Scattered vascular calcifications are noted at the left upper quadrant. A vascular stent is noted along the proximal right leg. Pacemaker leads are partially imaged. IMPRESSION: 1. Unremarkable bowel gas pattern; no free intra-abdominal air seen. 2. Scattered vascular calcifications noted. Original Report Authenticated By: Tonia Ghent, M.D.    EKG: Independently reviewed. Pending at time of admission.  Assessment/Plan  Principal Problem:  Nausea  vomiting and diarrhea  Active Problems:  Hypertension  Hyperlipidemia  Arteriosclerotic cardiovascular disease (ASCVD)  Pacemaker-St.Jude  Diabetes mellitus  Dementia  GERD (gastroesophageal reflux disease)  Hypothyroidism  PAD (peripheral artery disease)  CKD (chronic kidney disease)  Gait disorder  Vertebrobasilar insufficiency  Warfarin anticoagulation  Blindness of right eye  Atrial fibrillation  PVD (peripheral vascular disease)  S/P AKA (above knee amputation) unilateral  Chronic diastolic CHF (congestive heart failure)  Pulmonary hypertension  Hypotension  Dehydration  Bronchitis  This is a 79 year old woman with multiple chronic medical problems who is being admitted with dehydration secondary to nausea vomiting and diarrhea for the past 3 days of inability to maintain by mouth intake. In the emergency room she is quite hypotensive, febrile to 101.1 and responding to IV fluids, the extra caution is being given to her hydration status since she has a history of congestive heart failure. Given the history of present illness it seems that she may have had a sick contact and this may be a potential source for viral gastroenteritis. KUB was obtained which does not show an acute intra-abdominal process. In terms of her upper respiratory infection, her chest x-ray shows some peribronchial thickening however at the time of my examination the patient was not coughing or having any dyspnea -she is not febrile but she does have a slightly elevated white blood cell count and fever suggesting infection.  For now will attempt to manage her conservatively with IV fluid hydration and monitor her hypotension closely. May need to use pressors temporarily if her renal function declines or her UOP falls or signs of CHF occur. Per her history she was also placed on steroids and abruptly stopped  taking them and she has also not been able to take her other oral medications including Metoprolol, Lasix, and Apixiban for her anticoagulation. Will give her stress dose steroids/solu-cortef for short course given degree of hypotension, and have pharmacy monitor anticoagulaton-may been temporary use of Lovenox until she can resume oral anticoagulant. At this point I have discontinued the amoxicillin as she is unable to take by mouth medications, will start her on IV Doxycycline for bronchitis, may need to broaden this coverage if she shows signs of PNA or a definite source of infection is determined, does not appear to be sepsis. If she does show PNA she will need HCAP coverage. Interval CXR ordered for AM. Given fever of 101 on ED arrival, will check blood cultures, UA negative, no PNA on CXR, no acute abdomen. Will also check lactic acid. Code Status: Limited Code, meds only, discussed in detail with her daughter at bedside, patient has a living will -no intubation, no ACLS, pressors or IV antiarrythmic temporarily if needed.  Family Communication: Discussed plan in detail with her daughter at teh bedside.  Disposition Plan: Home with daughter when medically stable.  Time spent: 70 minutes  [facility]  [facility]  Pager [phone]  If 7PM-7AM, please contact night-coverage  [email or web address]  Password [facility]  12/21/2012, 5:14 AM

## 2012-12-21 NOTE — Progress Notes (Signed)
Dr. Rito Ehrlich notified of K level 5.4 No new orders received.

## 2012-12-21 NOTE — Progress Notes (Signed)
[facility] PROGRESS NOTE  Brittney [name] RUE:[id number] DOB: [birth year] DOA: 12/20/2012 PCP: Toma Deiters, MD  Assessment/Plan: Principal Problem:   Nausea vomiting and diarrhea: Looks to be self limiting gastroenteritis. Patient doing much better. Diarrhea stopped. Start solid food. Of bed to chair. The patient maintains blood pressure and repeat labs show improvement, we'll transfer out of ICU. Daughter updated. Potential discharge in the next one to 2 days. Active Problems:   Hypertension: Holding Lasix, continue beta blocker. Patient tolerated. Tapering IV fluids.    Hyperlipidemia   Arteriosclerotic cardiovascular disease (ASCVD)   Pacemaker-St.Jude    Diabetes mellitus: Now that she is eating, start sliding scale    Dementia: Patient is quite appropriate    GERD (gastroesophageal reflux disease)   Hypothyroidism: Continue Synthroid   PAD (peripheral artery disease)   CKD (chronic kidney disease): Stable. Recheck his labs this morning   Gait disorder   Vertebrobasilar insufficiency    Blindness of right eye   Atrial fibrillation: Patient on Eliquis    PVD (peripheral vascular disease)   S/P AKA (above knee amputation) unilateral   Chronic combined systolic and diastolic congestive heart failure: Gently hydrating. Holding Lasix. We'll taper down IV fluids. Check morning BNP.    Pulmonary hypertension   Hypotension   Dehydration   Bronchitis   Code Status: Limited code, meds only  Family Communication: Updated daughter by phone  Disposition Plan: Eventually home.   Consultants:  None  Procedures:  None  Antibiotics:  Doxycycline day 2  HPI/Subjective: Patient states she's feeling much better. Has not had any diarrhea symptoms since coming to the ICU. No chest pain. No shortness of breath.  Objective: Filed Vitals:   12/21/12 0500 12/21/12 0700 12/21/12 0800 12/21/12 0900  BP: 97/39 96/41 109/46 95/41  Pulse: 70 70 70 70  Temp:   97.7 F  (36.5 C)   TempSrc:   Axillary   Resp: 15 15 17 21   Height:      Weight:      SpO2: 98% 99% 100% 100%    Intake/Output Summary (Last 24 hours) at 12/21/12 1016 Last data filed at 12/21/12 0500  Gross per 24 hour  Intake 1363.34 ml  Output      0 ml  Net 1363.34 ml   Filed Weights   12/20/12 2039 12/21/12 0304  Weight: 62.143 kg (137 lb) 64.2 kg (141 lb 8.6 oz)    Exam:   General:  Alert and oriented x3, no acute distress  Cardiovascular: Regular rate and rhythm, S1-S2, soft 2/6 systolic ejection murmur  Respiratory: Clear auscultation bilaterally  Abdomen: Soft, nontender, nondistended, positive bowel sounds  Musculoskeletal: No clubbing or cyanosis, trace   Data Reviewed: Basic Metabolic Panel:  Recent Labs Lab 12/20/12 2220 12/21/12 0309  NA 132*  --   K 4.8  --   CL 101  --   CO2 20  --   GLUCOSE 108*  --   BUN 62*  --   CREATININE 1.72*  --   CALCIUM 8.3*  --   MG  --  1.7   Liver Function Tests:  Recent Labs Lab 12/20/12 2220  AST 16  ALT 14  ALKPHOS 78  BILITOT 0.5  PROT 6.5  ALBUMIN 2.8*    Recent Labs Lab 12/20/12 2244  LIPASE 16   CBC:  Recent Labs Lab 12/20/12 2220  WBC 13.7*  NEUTROABS 7.3  HGB 14.3  HCT 42.4  MCV 88.3  PLT 338   Cardiac Enzymes:  Recent Labs Lab 12/21/12 0309  TROPONINI <0.30   BNP (last 3 results)  Recent Labs  09/24/12 1750 10/03/12 1752 12/21/12 0309  PROBNP 6226.0* 8067.0* 542.5*   CBG: No results found for this basename: GLUCAP,  in the last 168 hours  Recent Results (from the past 240 hour(s))  MRSA PCR SCREENING     Status: Abnormal   Collection Time    12/21/12  2:53 AM      Result Value Range Status   MRSA by PCR POSITIVE (*) NEGATIVE Final   Comment:            The GeneXpert MRSA Assay (FDA     approved for NASAL specimens     only), is one component of a     comprehensive MRSA colonization     surveillance program. It is not     intended to diagnose MRSA      infection nor to guide or     monitor treatment for     MRSA infections.     RESULT CALLED TO, READ BACK BY AND VERIFIED WITH:     Ardis Rowan AT 0525 ON [id number] BY FORSYTH K     Studies: Dg Chest [facility] 1 View  12/21/2012    IMPRESSION: Enlargement of cardiac silhouette post pacemaker. Mild persistent bronchitic changes. No acute abnormalities.   Original Report Authenticated By: Ulyses Southward, M.D.    Dg Chest Portable 1 View  12/20/2012    IMPRESSION: Mild peribronchial thickening noted, with mild chronic lung changes seen.  No acute focal airspace consolidation seen.  Borderline cardiomegaly.   Original Report Authenticated By: Tonia Ghent, M.D.    Dg Abd Portable 1v  12/21/2012    IMPRESSION:  1.  Unremarkable bowel gas pattern; no free intra-abdominal air seen. 2.  Scattered vascular calcifications noted.   Original Report Authenticated By: Tonia Ghent, M.D.     Scheduled Meds: . apixaban  5 mg Oral Q12H  . Chlorhexidine Gluconate Cloth  6 each Topical Q0600  . doxycycline (VIBRAMYCIN) IV  100 mg Intravenous Q12H  . hydrocortisone sod succinate (SOLU-CORTEF) injection  50 mg Intravenous Q12H  . insulin glargine  10 Units Subcutaneous QHS  . levothyroxine  88 mcg Oral QAC breakfast  . metoprolol tartrate  25 mg Oral BID  . mupirocin ointment  1 application Nasal BID  . sertraline  100 mg Oral Daily  . sodium chloride  3 mL Intravenous Q12H  . Travoprost (BAK Free)  1 drop Both Eyes QHS   Continuous Infusions: . sodium chloride 100 mL/hr at 12/21/12 0500    Principal Problem:   Nausea vomiting and diarrhea Active Problems:   Hypertension   Hyperlipidemia   Arteriosclerotic cardiovascular disease (ASCVD)   Pacemaker-St.Jude   Diabetes mellitus   Dementia   GERD (gastroesophageal reflux disease)   Hypothyroidism   PAD (peripheral artery disease)   CKD (chronic kidney disease)   Gait disorder   Vertebrobasilar insufficiency   Warfarin anticoagulation   Blindness of  right eye   Atrial fibrillation   PVD (peripheral vascular disease)   S/P AKA (above knee amputation) unilateral   Chronic combined systolic and diastolic congestive heart failure   Pulmonary hypertension   Hypotension   Dehydration   Bronchitis    Time spent: 20 minutes    Hollice Espy  [facility] Pager [phone]. If 7PM-7AM, please contact night-coverage at [email or web address], password [facility] 12/21/2012, 10:16 AM  LOS: 1 day

## 2012-12-21 NOTE — Progress Notes (Signed)
UR Chart Review Completed  

## 2012-12-21 NOTE — Progress Notes (Signed)
Hypomagnesemia   Mg replaced  

## 2012-12-21 NOTE — ED Notes (Signed)
Pt uses 2L o2 at home while she sleeps, pt dropped to 88% while sleeping so placed on 2l. Now 99%

## 2012-12-21 NOTE — Care Management Note (Signed)
    Page 1 of 1   12/22/2012     1:48:54 PM   CARE MANAGEMENT NOTE 12/22/2012  Patient:  Brittney [name], Brittney [name]   Account Number:  [id number]  Date Initiated:  12/21/2012  Documentation initiated by:  Rosemary Holms  Subjective/Objective Assessment:   Pt admitted from home where she lives with her daughter. Baseline is that she walks her wheelchair. O2 at Home. Previous HH was through [facility] and if needed would choose them again.     Action/Plan:   Anticipated DC Date:  12/22/2012   Anticipated DC Plan:  HOME/SELF CARE      DC Planning Services  CM consult      Choice offered to / List presented to:             Status of service:  Completed, signed off Medicare Important Message given?  NA - LOS <3 / Initial given by admissions (If response is "NO", the following Medicare IM given date fields will be blank) Date Medicare IM given:   Date Additional Medicare IM given:    Discharge Disposition:  HOME/SELF CARE  Per UR Regulation:    If discussed at Long Length of Stay Meetings, dates discussed:    Comments:  12/21/12 Rosemary Holms RN BSN CM

## 2012-12-21 NOTE — Progress Notes (Signed)
[facility]  History and Physical  Brittney [name] MRN:[id number] DOB: 07/05/1932 DOA: 12/20/2012  Referring physician: EDP Opitz  PCP: HASANAJ,XAJE A, MD  Specialists: none  Chief Complaint: Nausea, Vomiting, Diarrhea  HPI: Brittney [name] is a 79 y.o. female with multiple chronic medical problems including ASCVD, diabetes, chronic kidney disease, peripheral artery disease status post above-the-knee amputation and congestive heart failure who presented to the emergency department with a 3 day history of nausea vomiting and diarrhea for which she has been unable to maintain adequate oral intake. Approximately 7 days ago she was also seen in the emergency department for an upper respiratory infection with cough and was empirically treated with amoxicillin and prednisone for bronchitis. She has been progressively weaker and more confused per her daughter who is at the bedside. Notably there are family members who have had GI viruses And URI's recently.  In the ED she was found to be clinically dehydrated and hypotensive, admission requested for further evaluation and management of her hypotension, volume depletion and possible URI/GI infection.  Review of Systems: Unable to obtain from patient, she is unable to communcate due to dementia, weakness and no hearing aids.  Past Medical History   Diagnosis  Date   .  Hypertension    .  Dyslipidemia    .  CAD (coronary artery disease)      Non-STEMI August, 2010,,,, 40% left main, 30% LAD, 60% OM 2, 40% RCA, 90% OM1-culprit lesion-too small for intervention   .  Ejection fraction      EF 55% ,2011 / EF 30% with tachycardia cardiomyopathy / EF 50% January, 2012   .  Diabetes mellitus    .  Dementia      ?? Early dementia ?? 2012   .  GERD (gastroesophageal reflux disease)    .  TIA (transient ischemic attack)      History of TIAs   .  Hypothyroidism    .  PAD (peripheral artery disease)      Carotid endarterectomy, right. Stents right peroneal,  right superficial, right popliteal arteries, Dr. Dickson   .  Amputation      Right midfoot 2010   .  Gait disorder    .  Vertebrobasilar insufficiency    .  Depression    .  Hyperkalemia      ACE Inhibitor held   .  Blindness of right eye    .  Respiratory failure      Hypoxic and hypercapnic, 2011, etiology pneumonia   .  Orthostatic hypotension      Syncope January 2012 with facial trauma, amiodarone stopped   .  Atrial fibrillation  8/10     on pradaxa, s/p AV nodal ablation   .  Ventricular tachycardia      20 beats December, 2012, asymptomatic   .  Atrial flutter  05/24/09     Hospitalization in [place]; with difficult rate control; TEE cardioversion done EF 30%, secondary to tachycardia, pt did convert back tosinus rhythm; return 10/11, amiodarone restarted 10/11, no longer on it. Recurrence with RVR 08/2011.   .  Anemia in CKD (chronic kidney disease)    .  Gastroparesis  3/11     Abnormal GES   .  Drug therapy, Sotolol      Sotolol started 08/2011.   .  Drug therapy      Pradaxa January, 2013   .  Complete heart block      s/p AV nodal ablation   .    Chronic diastolic CHF (congestive heart failure)      echo 03/2012- LVEF 55-60%, grade 2 diastolic dysfunction, mild LA dilatation, mild MR, paradoxical vent septal motion   .  On home O2      prn   .  PONV (postoperative nausea and vomiting)    .  S/P AKA (above knee amputation) unilateral      Left AKA October, 2013   .  Pacemaker    .  Pulmonary hypertension      PA pressure 45 mm of mercury in the past, // PA pressure 73 mmHg echo, January, 2014, when patient admitted wit diastolic CHF and ejection fraction down to 40%   .  CKD (chronic kidney disease)      Creatinine 1.19 at December 2012 discharge   .  Acute renal insufficiency      Catheterization August 2010   .  Atrial flutter  05/24/2009     Hospitalization in [place]; with difficult rate control; TEE cardioversion done EF 30%, secondary to tachycardia, pt  did convert back tosinus rhythm; return 10/11, amiodarone restarted 10/11, Stopped for autonomic dysfunction-fall / Recurrent atrial flutter, hospital, December, 2012, sotalol started, used 80 mg by mouth once daily because of renal function / Recurrent atrial fibrillation, J    Past Surgical History   Procedure  Laterality  Date   .  Total abdominal hysterectomy     .  Cholecystectomy     .  Carotid endarterectomy     .  Amputation   2010     RIGHT MIDFOOT   .  Tonsillectomy     .  Eye surgeries       Several right   .  Pacemaker insertion       SJM Pacemaker implant 9/10   .  Pci stent to the right peroneal and right superficial as well as right popliteal arteries       Dr. Dickson   .  Av nodal ablation   08/2011     by Dr Taylor   .  I&d extremity   05/28/2012     Procedure: IRRIGATION AND DEBRIDEMENT EXTREMITY; Surgeon: Charles E Fields, MD; Location: [facility] OR; Service: Vascular; Laterality: Left; possible amputation toes   .  Amputation   07/02/2012     Procedure: AMPUTATION ABOVE KNEE; Surgeon: Marcus V Duda, MD; Location: [facility] OR; Service: Orthopedics; Laterality: Left; Left Above Knee Amputation    Social History: reports that she has never smoked. She has never used smokeless tobacco. She reports that she does not drink alcohol or use illicit drugs.  Allergies   Allergen  Reactions   .  Amiodarone      Autonomic dysfunction   .  Morphine Sulfate      REACTION: jerking   .  Oxycodone Hcl  Itching and Other (See Comments)     Feels weird   .  Sulfa Antibiotics  Other (See Comments)     unknown    Family History   Problem  Relation  Age of Onset   .  Cancer  Brother      Colon cancer   .  Aortic aneurysm  Mother    .  Colon polyps  Neg Hx    .  Liver disease  Neg Hx      And no CRC    Prior to Admission medications   Medication  Sig  Start Date  End Date  Taking?  Authorizing Provider   acetaminophen (TYLENOL)   500 MG tablet  Take 1,000 mg by mouth 3 (three) times daily  as needed. For pain    Yes  Historical Provider, MD   amoxicillin (AMOXIL) 500 MG capsule  Take 1 capsule (500 mg total) by mouth 3 (three) times daily.  12/13/12   Yes  Hobson M Bryant, PA-C   apixaban (ELIQUIS) 5 MG TABS tablet  Take 1 tablet (5 mg total) by mouth 2 (two) times daily.  10/27/12   Yes  James Allred, MD   docusate sodium (COLACE) 100 MG capsule  Take 100 mg by mouth 2 (two) times daily.    Yes  Historical Provider, MD   furosemide (LASIX) 40 MG tablet  Take 1.5 tablets (60 mg total) by mouth daily.  10/20/12   Yes  Eugene Serpe, PA-C   HYDROcodone-acetaminophen (NORCO) 5-325 MG per tablet  Take 1 tablet by mouth every 6 (six) hours as needed for pain.  07/05/12   Yes  Marcus V Duda, MD   insulin aspart (NOVOLOG) 100 UNIT/ML injection  Inject 0-10 Units into the skin 4 (four) times daily - before meals and at bedtime. Per sliding scale: 0-150=0 units; 151-200=2 units; 201-250=4 units; 251-300=6 units; 301-350=8 units; >350=10 units; >450=CALL MD    Yes  Historical Provider, MD   insulin glargine (LANTUS) 100 UNIT/ML injection  Inject 10 Units into the skin at bedtime.  10/06/12   Yes  Jehanzeb Memon, MD   levothyroxine (SYNTHROID, LEVOTHROID) 88 MCG tablet  Take 1 tablet (88 mcg total) by mouth daily before breakfast.  06/08/12   Yes  Prashant K Singh, MD   metoprolol tartrate (LOPRESSOR) 25 MG tablet  Take 25 mg by mouth 2 (two) times daily.    Yes  Historical Provider, MD   ondansetron (ZOFRAN-ODT) 8 MG disintegrating tablet  Take 8 mg by mouth every 4 (four) hours as needed. 8mg ODT q4 hours prn nausea  06/26/12   Yes  John M Bednar, MD   potassium chloride (K-DUR) 10 MEQ tablet  Take 20 mEq by mouth 2 (two) times daily.    Yes  Historical Provider, MD   sertraline (ZOLOFT) 100 MG tablet  Take 100 mg by mouth daily.    Yes  Historical Provider, MD   travoprost, benzalkonium, (TRAVATAN) 0.004 % ophthalmic solution  Place 1 drop into both eyes at bedtime.    Yes  Historical Provider, MD    vitamin C (ASCORBIC ACID) 500 MG tablet  Take 1,000 mg by mouth 2 (two) times daily.    Yes  Historical Provider, MD   predniSONE (DELTASONE) 10 MG tablet  5,4,3,2,1 - take with food  12/13/12    Hobson M Bryant, PA-C   Physical Exam:  Filed Vitals:    12/21/12 0200  12/21/12 0304  12/21/12 0400  12/21/12 0500   BP:  85/37  91/43  91/38  97/39   Pulse:  70  70  70  70   Temp:   97.7 F (36.5 C)     TempSrc:   Oral     Resp:  18   16  15   Height:   5' 7" (1.702 m)     Weight:   64.2 kg (141 lb 8.6 oz)     SpO2:  97%  100%  98%  98%    General: Chronically ill appearing, pale, awakes with light touch  Eyes: normal  ENT: dry MM  Neck: supple  Cardiovascular: SEM 2/6 Irregular rhythm  Respiratory: scattered   wheeze, good air movement  Abdomen: soft, but guards on palpation of right lower quadrant, active BS  Skin: dry, trace edema LE  Musculoskeletal: left leg AKA, well healed, right partial foot amputation  Psychiatric: difficult to assess  Neurologic: non-focal, global lethargy Labs on Admission:  Basic Metabolic Panel:   Recent Labs  Lab  12/20/12 2220  12/21/12 0309   NA  132*  --   K  4.8  --   CL  101  --   CO2  20  --   GLUCOSE  108*  --   BUN  62*  --   CREATININE  1.72*  --   CALCIUM  8.3*  --   MG  --  1.7    Liver Function Tests:   Recent Labs  Lab  12/20/12 2220   AST  16   ALT  14   ALKPHOS  78   BILITOT  0.5   PROT  6.5   ALBUMIN  2.8*     Recent Labs  Lab  12/20/12 2244   LIPASE  16    No results found for this basename: AMMONIA, in the last 168 hours  CBC:   Recent Labs  Lab  12/20/12 2220   WBC  13.7*   NEUTROABS  7.3   HGB  14.3   HCT  42.4   MCV  88.3   PLT  338    Cardiac Enzymes:   Recent Labs  Lab  12/21/12 0309   TROPONINI  <0.30    BNP (last 3 results)   Recent Labs   09/24/12 1750  10/03/12 1752  12/21/12 0309   PROBNP  6226.0*  8067.0*  542.5*    CBG:  No results found for this basename: GLUCAP, in the  last 168 hours  Radiological Exams on Admission:  Dg Chest Portable 1 View  12/20/2012 *RADIOLOGY REPORT* Clinical Data: Weakness and vomiting. PORTABLE CHEST - 1 VIEW Comparison: Chest radiograph performed 12/13/2012 Findings: The lungs are well-aerated. Mild peribronchial thickening is noted, with mild chronically increased interstitial markings seen. There is no evidence of focal opacification, pleural effusion or pneumothorax. The cardiomediastinal silhouette is borderline enlarged. A pacemaker is seen overlying the left chest wall, with leads ending overlying the right atrium and right ventricle. No acute osseous abnormalities are seen. IMPRESSION: Mild peribronchial thickening noted, with mild chronic lung changes seen. No acute focal airspace consolidation seen. Borderline cardiomegaly. Original Report Authenticated By: Jeffrey Chang, M.D.  Dg Abd Portable 1v  12/21/2012 *RADIOLOGY REPORT* Clinical Data: Nausea, vomiting and diarrhea; right lower quadrant tenderness. PORTABLE ABDOMEN - 1 VIEW Comparison: CT of the abdomen and pelvis performed 06/26/2012, and lumbar spine radiographs performed 05/21/2012 Findings: The visualized bowel gas pattern is unremarkable. Scattered air and stool filled loops of colon are seen; no abnormal dilatation of small bowel loops is seen to suggest small bowel obstruction. No free intra-abdominal air is identified, though evaluation for free air is limited on supine views. The visualized osseous structures are within normal limits; the sacroiliac joints are unremarkable in appearance. The visualized lung bases are essentially clear. Scattered vascular calcifications are noted at the left upper quadrant. A vascular stent is noted along the proximal right leg. Pacemaker leads are partially imaged. IMPRESSION: 1. Unremarkable bowel gas pattern; no free intra-abdominal air seen. 2. Scattered vascular calcifications noted. Original Report Authenticated By: Jeffrey Chang, M.D.    EKG: Independently reviewed. Pending at time of admission.  Assessment/Plan  Principal Problem:  Nausea   vomiting and diarrhea  Active Problems:  Hypertension  Hyperlipidemia  Arteriosclerotic cardiovascular disease (ASCVD)  Pacemaker-[employer]  Diabetes mellitus  Dementia  GERD (gastroesophageal reflux disease)  Hypothyroidism  PAD (peripheral artery disease)  CKD (chronic kidney disease)  Gait disorder  Vertebrobasilar insufficiency  Warfarin anticoagulation  Blindness of right eye  Atrial fibrillation  PVD (peripheral vascular disease)  S/P AKA (above knee amputation) unilateral  Chronic diastolic CHF (congestive heart failure)  Pulmonary hypertension  Hypotension  Dehydration  Bronchitis  This is a 79-year-old woman with multiple chronic medical problems who is being admitted with dehydration secondary to nausea vomiting and diarrhea for the past 3 days of inability to maintain by mouth intake. In the emergency room she is quite hypotensive, febrile to 101.1 and responding to IV fluids, the extra caution is being given to her hydration status since she has a history of congestive heart failure. Given the history of present illness it seems that she may have had a sick contact and this may be a potential source for viral gastroenteritis. KUB was obtained which does not show an acute intra-abdominal process. In terms of her upper respiratory infection, her chest x-ray shows some peribronchial thickening however at the time of my examination the patient was not coughing or having any dyspnea -she is not febrile but she does have a slightly elevated white blood cell count and fever suggesting infection.  For now will attempt to manage her conservatively with IV fluid hydration and monitor her hypotension closely. May need to use pressors temporarily if her renal function declines or her UOP falls or signs of CHF occur. Per her history she was also placed on steroids and abruptly stopped  taking them and she has also not been able to take her other oral medications including Metoprolol, Lasix, and Apixiban for her anticoagulation. Will give her stress dose steroids/solu-cortef for short course given degree of hypotension, and have pharmacy monitor anticoagulaton-may been temporary use of Lovenox until she can resume oral anticoagulant. At this point I have discontinued the amoxicillin as she is unable to take by mouth medications, will start her on IV Doxycycline for bronchitis, may need to broaden this coverage if she shows signs of PNA or a definite source of infection is determined, does not appear to be sepsis. If she does show PNA she will need HCAP coverage. Interval CXR ordered for AM. Given fever of 101 on ED arrival, will check blood cultures, UA negative, no PNA on CXR, no acute abdomen. Will also check lactic acid. Code Status: Limited Code, meds only, discussed in detail with her daughter at bedside, patient has a living will -no intubation, no ACLS, pressors or IV antiarrythmic temporarily if needed.  Family Communication: Discussed plan in detail with her daughter at teh bedside.  Disposition Plan: Home with daughter when medically stable.  Time spent: 70 minutes  GOLDING,ELIZABETH  [facility]  Pager [phone]  If 7PM-7AM, please contact night-coverage  [email or web address]  Password [email or web address]  12/21/2012, 5:14 AM     

## 2012-12-21 NOTE — Progress Notes (Signed)
SUP ordered

## 2012-12-21 NOTE — Progress Notes (Signed)
14 fr Foley inserted without difficulty. Yellow clear urine returned

## 2012-12-22 ENCOUNTER — Ambulatory Visit (HOSPITAL_COMMUNITY): Payer: Medicare Other | Admitting: Physical Therapy

## 2012-12-22 DIAGNOSIS — N289 Disorder of kidney and ureter, unspecified: Secondary | ICD-10-CM

## 2012-12-22 DIAGNOSIS — N189 Chronic kidney disease, unspecified: Secondary | ICD-10-CM

## 2012-12-22 DIAGNOSIS — E860 Dehydration: Secondary | ICD-10-CM

## 2012-12-22 DIAGNOSIS — J40 Bronchitis, not specified as acute or chronic: Secondary | ICD-10-CM

## 2012-12-22 LAB — GI PATHOGEN PANEL BY PCR, STOOL
Campylobacter by PCR: NEGATIVE
E coli (ETEC) LT/ST: NEGATIVE
E coli (STEC): NEGATIVE
Norovirus GI/GII: POSITIVE
Rotavirus A by PCR: NEGATIVE
Shigella by PCR: NEGATIVE

## 2012-12-22 LAB — BASIC METABOLIC PANEL
BUN: 41 mg/dL — ABNORMAL HIGH (ref 6–23)
Calcium: 8.7 mg/dL (ref 8.4–10.5)
GFR calc Af Amer: 47 mL/min — ABNORMAL LOW (ref >90)
GFR calc non Af Amer: 40 mL/min — ABNORMAL LOW (ref >90)
Glucose, Bld: 245 mg/dL — ABNORMAL HIGH (ref 70–99)

## 2012-12-22 LAB — GLUCOSE, CAPILLARY: Glucose-Capillary: 281 mg/dL — ABNORMAL HIGH (ref 70–99)

## 2012-12-22 LAB — PRO B NATRIURETIC PEPTIDE: Pro B Natriuretic peptide (BNP): 2011 pg/mL — ABNORMAL HIGH (ref 0–450)

## 2012-12-22 LAB — CBC
MCH: 29.8 pg (ref 26.0–34.0)
MCHC: 33.7 g/dL (ref 30.0–36.0)
Platelets: 289 10*3/uL (ref 150–400)
RDW: 13.5 % (ref 11.5–15.5)

## 2012-12-22 MED ORDER — LOPERAMIDE HCL 2 MG PO CAPS: 2.0000 mg | ORAL_CAPSULE | ORAL | Status: AC | PRN

## 2012-12-22 MED ORDER — FUROSEMIDE 40 MG PO TABS: 40.0000 mg | ORAL_TABLET | Freq: Every day | ORAL | Status: AC

## 2012-12-22 NOTE — Discharge Summary (Addendum)
Physician Discharge Summary  JALASIA ESKRIDGE [name]:[id number] DOB: August 18, 1932 DOA: 12/20/2012  PCP: Toma Deiters, MD  Admit date: 12/20/2012 Discharge date: 12/22/2012  Time spent: Greater than 30 minutes  Recommendations for Outpatient Follow-up:  1. Follow with primary care physician in the next week or so.  Discharge Diagnoses:  1. Gastroenteritis secondary to Norovirus,improved. 2. Acute on chronic renal failure secondary to dehydration, improved. 3. Hypertension, stable. 4. Type 2 diabetes mellitus. 5. Mild dementia. 6. Atrial fibrillation on chronic anticoagulation.   Discharge Condition: Stable and improved.  Diet recommendation: Advance as tolerated to, carbohydrate modified diet.  Filed Weights   12/20/12 2039 12/21/12 0304  Weight: 62.143 kg (137 lb) 64.2 kg (141 lb 8.6 oz)    History of present illness:  This very pleasant 79 year old lady presented to the hospital with symptoms nausea, vomiting and diarrhea. Please see initial history as outlined below: HPI: Brittney [name] is a 79 y.o. female with multiple chronic medical problems including ASCVD, diabetes, chronic kidney disease, peripheral artery disease status post above-the-knee amputation and congestive heart failure who presented to the emergency department with a 3 day history of nausea vomiting and diarrhea for which she has been unable to maintain adequate oral intake. Approximately 7 days ago she was also seen in the emergency department for an upper respiratory infection with cough and was empirically treated with amoxicillin and prednisone for bronchitis. She has been progressively weaker and more confused per her daughter who is at the bedside. Notably there are family members who have had GI viruses And URI's recently.  In the ED she was found to be clinically dehydrated and hypotensive, admission requested for further evaluation and management of her hypotension, volume depletion and possible URI/GI infection.   Review of Systems: Unable to obtain from patient, she is unable to communcate due to dementia, weakness and no hearing aids.   Hospital Course:  Patient was admitted and started on intravenous fluids with attention to the possibility of congestive heart failure that she has a history of. She has done well and hasn't responded appropriately. The BUN and creatinine improved to almost normal levels now. She feels much improved, and she has minimal loose stools yesterday but is tolerating a diet without any nausea vomiting or abdominal pain. She was started on empirical intravenous doxycycline for bronchitis that she had been prescribed amoxicillin for previously. I do not think antibiotics are responsible for her diarrhea. I think this is more of a viral gastroenteritis. She has clearly made an appropriate improvement with supportive care. She is now stable for discharge.   Procedures:  None.   Consultations:  None.  Discharge Exam: Filed Vitals:   12/21/12 1500 12/21/12 1600 12/21/12 2117 12/22/12 0545  BP: 119/39 116/38 116/65 124/71  Pulse: 70 70 69 69  Temp:  97.9 F (36.6 C) 99.1 F (37.3 C) 97.8 F (36.6 C)  TempSrc:  Oral Oral Oral  Resp: 17 18 18 18   Height:      Weight:      SpO2: 96% 100% 99% 99%    General: She looks systemically well. She is not clinically dehydrated. Cardiovascular: Heart sounds are present and normal without murmurs. Respiratory: Lung fields are clear. Abdomen is soft and nontender. There are no masses. There is no hepatosplenomegaly. Bowel sounds are heard. She is alert and orientated. She is hard of hearing.  Discharge Instructions  Discharge Orders   Future Appointments Provider Department Dept Phone   12/27/2012 1:00 PM Matilde Haymaker,  PT [facility] [phone]   12/29/2012 11:00 AM Matilde Haymaker, PT [facility] [phone]   01/24/2013 9:55 AM [facility]  Office [employer]) [phone]   Future Orders Complete By Expires     Diet - low sodium heart healthy  As directed     Increase activity slowly  As directed         Medication List    STOP taking these medications       docusate sodium 100 MG capsule  Commonly known as:  COLACE     potassium chloride 10 MEQ tablet  Commonly known as:  K-DUR      TAKE these medications       acetaminophen 500 MG tablet  Commonly known as:  TYLENOL  Take 1,000 mg by mouth 3 (three) times daily as needed. For pain     amoxicillin 500 MG capsule  Commonly known as:  AMOXIL  Take 1 capsule (500 mg total) by mouth 3 (three) times daily.     apixaban 5 MG Tabs tablet  Commonly known as:  ELIQUIS  Take 1 tablet (5 mg total) by mouth 2 (two) times daily.     furosemide 40 MG tablet  Commonly known as:  LASIX  Take 1 tablet (40 mg total) by mouth daily.     HYDROcodone-acetaminophen 5-325 MG per tablet  Commonly known as:  NORCO  Take 1 tablet by mouth every 6 (six) hours as needed for pain.     insulin aspart 100 UNIT/ML injection  Commonly known as:  novoLOG  Inject 0-10 Units into the skin 4 (four) times daily -  before meals and at bedtime. Per sliding scale: 0-150=0 units; 151-200=2 units; 201-250=4 units; 251-300=6 units; 301-350=8 units; >350=10 units; >450=CALL MD     insulin glargine 100 UNIT/ML injection  Commonly known as:  LANTUS  Inject 10 Units into the skin at bedtime.     levothyroxine 88 MCG tablet  Commonly known as:  SYNTHROID, LEVOTHROID  Take 1 tablet (88 mcg total) by mouth daily before breakfast.     loperamide 2 MG capsule  Commonly known as:  IMODIUM  Take 1 capsule (2 mg total) by mouth as needed for diarrhea or loose stools (up to 16mg  per 24 hrs).     metoprolol tartrate 25 MG tablet  Commonly known as:  LOPRESSOR  Take 25 mg by mouth 2 (two) times daily.     ondansetron 8 MG disintegrating tablet  Commonly known as:  ZOFRAN-ODT  Take 8 mg by mouth every  4 (four) hours as needed. 8mg  ODT q4 hours prn nausea     predniSONE 10 MG tablet  Commonly known as:  DELTASONE  5,4,3,2,1 - take with food     sertraline 100 MG tablet  Commonly known as:  ZOLOFT  Take 100 mg by mouth daily.     travoprost (benzalkonium) 0.004 % ophthalmic solution  Commonly known as:  TRAVATAN  Place 1 drop into both eyes at bedtime.     vitamin C 500 MG tablet  Commonly known as:  ASCORBIC ACID  Take 1,000 mg by mouth 2 (two) times daily.          The results of significant diagnostics from this hospitalization (including imaging, microbiology, ancillary and laboratory) are listed below for reference.    Significant Diagnostic Studies: Dg Chest Port 1 View  12/21/2012  *RADIOLOGY REPORT*  Clinical Data: Weakness, vomiting, hypertension, diabetes, coronary  artery disease, pulmonary hypertension, chronic kidney disease, atrial fibrillation  PORTABLE CHEST - 1 VIEW  Comparison: Portable exam 0650 hours compared to 12/20/2012  Findings: Left subclavian sequential transvenous pacemaker leads project at right atrium and right ventricle. Enlargement of cardiac silhouette with minimally prominent central pulmonary arteries. Atherosclerotic calcification aorta. Peribronchial thickening question underlying emphysematous changes. No definite acute infiltrate or pleural effusion.  IMPRESSION: Enlargement of cardiac silhouette post pacemaker. Mild persistent bronchitic changes. No acute abnormalities.   Original Report Authenticated By: Ulyses Southward, M.D.    Dg Chest Portable 1 View  12/20/2012  *RADIOLOGY REPORT*  Clinical Data: Weakness and vomiting.  PORTABLE CHEST - 1 VIEW  Comparison: Chest radiograph performed 12/13/2012  Findings: The lungs are well-aerated.  Mild peribronchial thickening is noted, with mild chronically increased interstitial markings seen.  There is no evidence of focal opacification, pleural effusion or pneumothorax.  The cardiomediastinal silhouette is  borderline enlarged.  A pacemaker is seen overlying the left chest wall, with leads ending overlying the right atrium and right ventricle.  No acute osseous abnormalities are seen.  IMPRESSION: Mild peribronchial thickening noted, with mild chronic lung changes seen.  No acute focal airspace consolidation seen.  Borderline cardiomegaly.   Original Report Authenticated By: Tonia Ghent, M.D.    Dg Chest Port 1 View  12/13/2012  *RADIOLOGY REPORT*  Clinical Data: Cough and wheezing.  PORTABLE CHEST - 1 VIEW  Comparison: Chest radiograph performed 10/03/2012  Findings: The lungs are well-aerated.  Previously noted vascular congestion has improved.  Minimal bilateral atelectasis is seen. There is no evidence of pleural effusion or pneumothorax.  The cardiomediastinal silhouette is mildly enlarged.  Calcification is noted along the aortic arch.  A pacemaker is seen overlying the left chest wall, with leads ending overlying the right atrium and right ventricle.  No acute osseous abnormalities are seen.  IMPRESSION: Interval improvement in vascular congestion; persistent mild cardiomegaly.  Minimal bilateral atelectasis seen; lungs otherwise clear.   Original Report Authenticated By: Tonia Ghent, M.D.    Dg Abd Portable 1v  12/21/2012  *RADIOLOGY REPORT*  Clinical Data: Nausea, vomiting and diarrhea; right lower quadrant tenderness.  PORTABLE ABDOMEN - 1 VIEW  Comparison: CT of the abdomen and pelvis performed 06/26/2012, and lumbar spine radiographs performed 05/21/2012  Findings: The visualized bowel gas pattern is unremarkable. Scattered air and stool filled loops of colon are seen; no abnormal dilatation of small bowel loops is seen to suggest small bowel obstruction.  No free intra-abdominal air is identified, though evaluation for free air is limited on supine views.  The visualized osseous structures are within normal limits; the sacroiliac joints are unremarkable in appearance.  The visualized lung bases  are essentially clear.  Scattered vascular calcifications are noted at the left upper quadrant.  A vascular stent is noted along the proximal right leg.  Pacemaker leads are partially imaged.  IMPRESSION:  1.  Unremarkable bowel gas pattern; no free intra-abdominal air seen. 2.  Scattered vascular calcifications noted.   Original Report Authenticated By: Tonia Ghent, M.D.     Microbiology: Recent Results (from the past 240 hour(s))  MRSA PCR SCREENING     Status: Abnormal   Collection Time    12/21/12  2:53 AM      Result Value Range Status   MRSA by PCR POSITIVE (*) NEGATIVE Final   Comment:            The GeneXpert MRSA Assay (FDA     approved for NASAL specimens  only), is one component of a     comprehensive MRSA colonization     surveillance program. It is not     intended to diagnose MRSA     infection nor to guide or     monitor treatment for     MRSA infections.     RESULT CALLED TO, READ BACK BY AND VERIFIED WITH:     WAGONER R AT 0525 ON [id number] BY FORSYTH K  CULTURE, BLOOD (ROUTINE X 2)     Status: None   Collection Time    12/21/12  5:50 AM      Result Value Range Status   Specimen Description BLOOD RIGHT HAND   Final   Special Requests BOTTLES DRAWN AEROBIC AND ANAEROBIC 6CC   Final   Culture NO GROWTH <24 HRS   Final   Report Status PENDING   Incomplete  CULTURE, BLOOD (ROUTINE X 2)     Status: None   Collection Time    12/21/12  5:50 AM      Result Value Range Status   Specimen Description BLOOD RIGHT ARM   Final   Special Requests BOTTLES DRAWN AEROBIC AND ANAEROBIC 6CC   Final   Culture NO GROWTH <24 HRS   Final   Report Status PENDING   Incomplete     Labs: Basic Metabolic Panel:  Recent Labs Lab 12/20/12 2220 12/21/12 0309 12/21/12 1050 12/22/12 0501  NA 132*  --  130* 134*  K 4.8  --  5.4* 4.7  CL 101  --  99 105  CO2 20  --  23 21  GLUCOSE 108*  --  364* 245*  BUN 62*  --  56* 41*  CREATININE 1.72*  --  1.51* 1.24*  CALCIUM 8.3*  --   8.5 8.7  MG  --  1.7  --   --    Liver Function Tests:  Recent Labs Lab 12/20/12 2220  AST 16  ALT 14  ALKPHOS 78  BILITOT 0.5  PROT 6.5  ALBUMIN 2.8*    Recent Labs Lab 12/20/12 2244  LIPASE 16    CBC:  Recent Labs Lab 12/20/12 2220 12/21/12 1050 12/22/12 0501  WBC 13.7* 12.2* 11.3*  NEUTROABS 7.3  --   --   HGB 14.3 13.6 12.5  HCT 42.4 40.4 37.1  MCV 88.3 88.6 88.5  PLT 338 277 289   Cardiac Enzymes:  Recent Labs Lab 12/21/12 0309  TROPONINI <0.30   BNP: BNP (last 3 results)  Recent Labs  10/03/12 1752 12/21/12 0309 12/22/12 0501  PROBNP 8067.0* 542.5* 2011.0*   CBG:  Recent Labs Lab 12/21/12 1134 12/21/12 1611 12/21/12 2101  GLUCAP 300* 373* 178*       Signed:  Vere Diantonio C  [facility] 12/22/2012, 7:56 AM Addendum:Stoolstudies were found to be positive for Norovirus.No further treatment required.

## 2012-12-26 LAB — CULTURE, BLOOD (ROUTINE X 2): Culture: NO GROWTH

## 2012-12-27 ENCOUNTER — Ambulatory Visit (HOSPITAL_COMMUNITY)
Admission: RE | Admit: 2012-12-27 | Discharge: 2012-12-27 | Disposition: A | Discharge status: Home / Self Care | Payer: Medicare Other | Source: Ambulatory Visit | Attending: Orthopedic Surgery | Admitting: Orthopedic Surgery

## 2012-12-27 NOTE — Progress Notes (Signed)
Physical Therapy Treatment Patient Details  Name: GRADY LUCCI MRN: [id number] Date of Birth: 12-Apr-1932  Today's Date: 12/27/2012 Time: 1300-1340 PT Time Calculation (min): 40 min Charges: Gait x 9', NMR x15, TA: x15 Visit#: 5 of 13  Re-eval: 01/26/13 Assessment Diagnosis: Lt AKA Surgical Date: 07/02/12 Next MD Visit: Dr. Lajoyce Corners * May  Authorization: [facility] Medicare  Authorization Time Period:    Authorization Visit#: 5 of 8   Subjective: Symptoms/Limitations Symptoms: Pt has not been to therapy since 12/08/12 secondary to illness and admitted to [facility] on 12/21/12 and d/c on 12/22/12. She reports that she has been at home and doing a lot of laying down and sitting up and occasional walking when she is able to.  She is feeling.    Precautions/Restrictions     Exercise/Treatments Mobility/Balance  Ambulation/Gait Ambulation/Gait Assistance: 4: Min Environmental consultant (Feet): 42 Feet (in 2 minutes (was 66 feet in 10 minutes)) Assistive device: Rolling walker    Standing Forward Step Up: Right;10 reps;Hand Hold: 2;Step Height: 4";Limitations Forward Step Up Limitations: mod-max Assist Step Down: Right;5 reps;Hand Hold: 2;Step Height: 4";Limitations Step Down Limitations: Max Assist teaching for using her step at the house.  Gait Training: Min A 9 minutes: 150 feet Standing Standing Eyes Opened: Wide (BOA);Solid surface;2 reps;Time Standing Eyes Opened Time: 3 minutes, 5 minutes w/mod Assist to modified independent Sit to Stand: Standard surface;Limitations Sit to Stand Limitations: from lowered blue mat w/BUE assist w/min Assist and cueing for sequencing.    Physical Therapy Assessment and Plan PT Assessment and Plan Clinical Impression Statement:  Re-eval complete from hospitalization. Pt continues to wish to ambulate with a SPC. At this time worked on finding her COG using static standing without UE assist, pt began with significant fear and by end of session about to stand  with supervision for 10 seconds.  Pt requires mod Assit for ascending step and max assit to descend step with RW.  Instructed pt on proper stair techniques.  PT Plan: Continue to progress towards goals, increasing LE strength and ambulation independence.    Goals Home Exercise Program Pt will Perform Home Exercise Program: Independently PT Goal: Perform Home Exercise Program - Progress: Progressing toward goal PT Short Term Goals Time to Complete Short Term Goals: 4 weeks PT Short Term Goal 1: Pt will improve LLE strength in order to ambulate with improved gait mechanics for 100 feet in 10 minutes w/LRAD PT Short Term Goal 1 - Progress: Progressing toward goal PT Short Term Goal 2: Pt will improve her proprioception and demonstrate narrow BOS standing with eyes closed for 1 minute/ PT Short Term Goal 2 - Progress: Progressing toward goal PT Short Term Goal 3: Pt will complete the berg balance scale  (BBS) and dynamic gait index (DGI) in order to determine approprirate level of support.  PT Short Term Goal 3 - Progress: Progressing toward goal PT Long Term Goals Time to Complete Long Term Goals: 8 weeks PT Long Term Goal 1: Pt will improve her BBS to 30/56 and her DGI to 12/24 to decrease her risk of falls.  PT Long Term Goal 1 - Progress: Progressing toward goal PT Long Term Goal 2: Pt will imporve her gait speed and ambulate 0.8 ft/sec for improved safety in the community.  PT Long Term Goal 2 - Progress: Progressing toward goal Long Term Goal 3: Pt will improver her LLE strength to [facility] in order to tolerate ambulating 30 minutes in order to perform community mobility.  Long  Term Goal 3 Progress: Progressing toward goal Long Term Goal 4: Pt will require mod assist to don LLE prosethic in order to improve independence and decrease burden of care.  Long Term Goal 4 Progress: Progressing toward goal  Problem List Patient Active Problem List  Diagnosis  . Hypertension  . Hyperlipidemia  .  Arteriosclerotic cardiovascular disease (ASCVD)  . Pacemaker-St.Jude  . Diabetes mellitus  . Dementia  . GERD (gastroesophageal reflux disease)  . Hypothyroidism  . PAD (peripheral artery disease)  . CKD (chronic kidney disease)  . Gait disorder  . Vertebrobasilar insufficiency  . Warfarin anticoagulation  . Blindness of right eye  . Atrial fibrillation  . PVD (peripheral vascular disease)  . S/P AKA (above knee amputation) unilateral  . Chronic combined systolic and diastolic congestive heart failure  . Pulmonary hypertension  . Hypotension  . Dehydration  . Nausea vomiting and diarrhea  . Bronchitis    PT - End of Session Equipment Utilized During Treatment: Gait belt Activity Tolerance: Patient tolerated treatment well General Cognition: WFL for tasks performed PT Plan of Care PT Home Exercise Plan: to encourage step traianing at home and static standing balance by her bed.  Consulted and Agree with Plan of Care: Patient;Family member/caregiver Family Member Consulted: daughter [name])  Tashawn Greff, PT 12/27/2012, 2:16 PM

## 2012-12-28 IMAGING — CR DG CHEST 2V
2 series · 2 of 2 positions shown · non-contrast
Comparison: 05/29/2012

CLINICAL DATA: Chest pain.  Hypertension.

CHEST - 2 VIEW

[x chest ap]
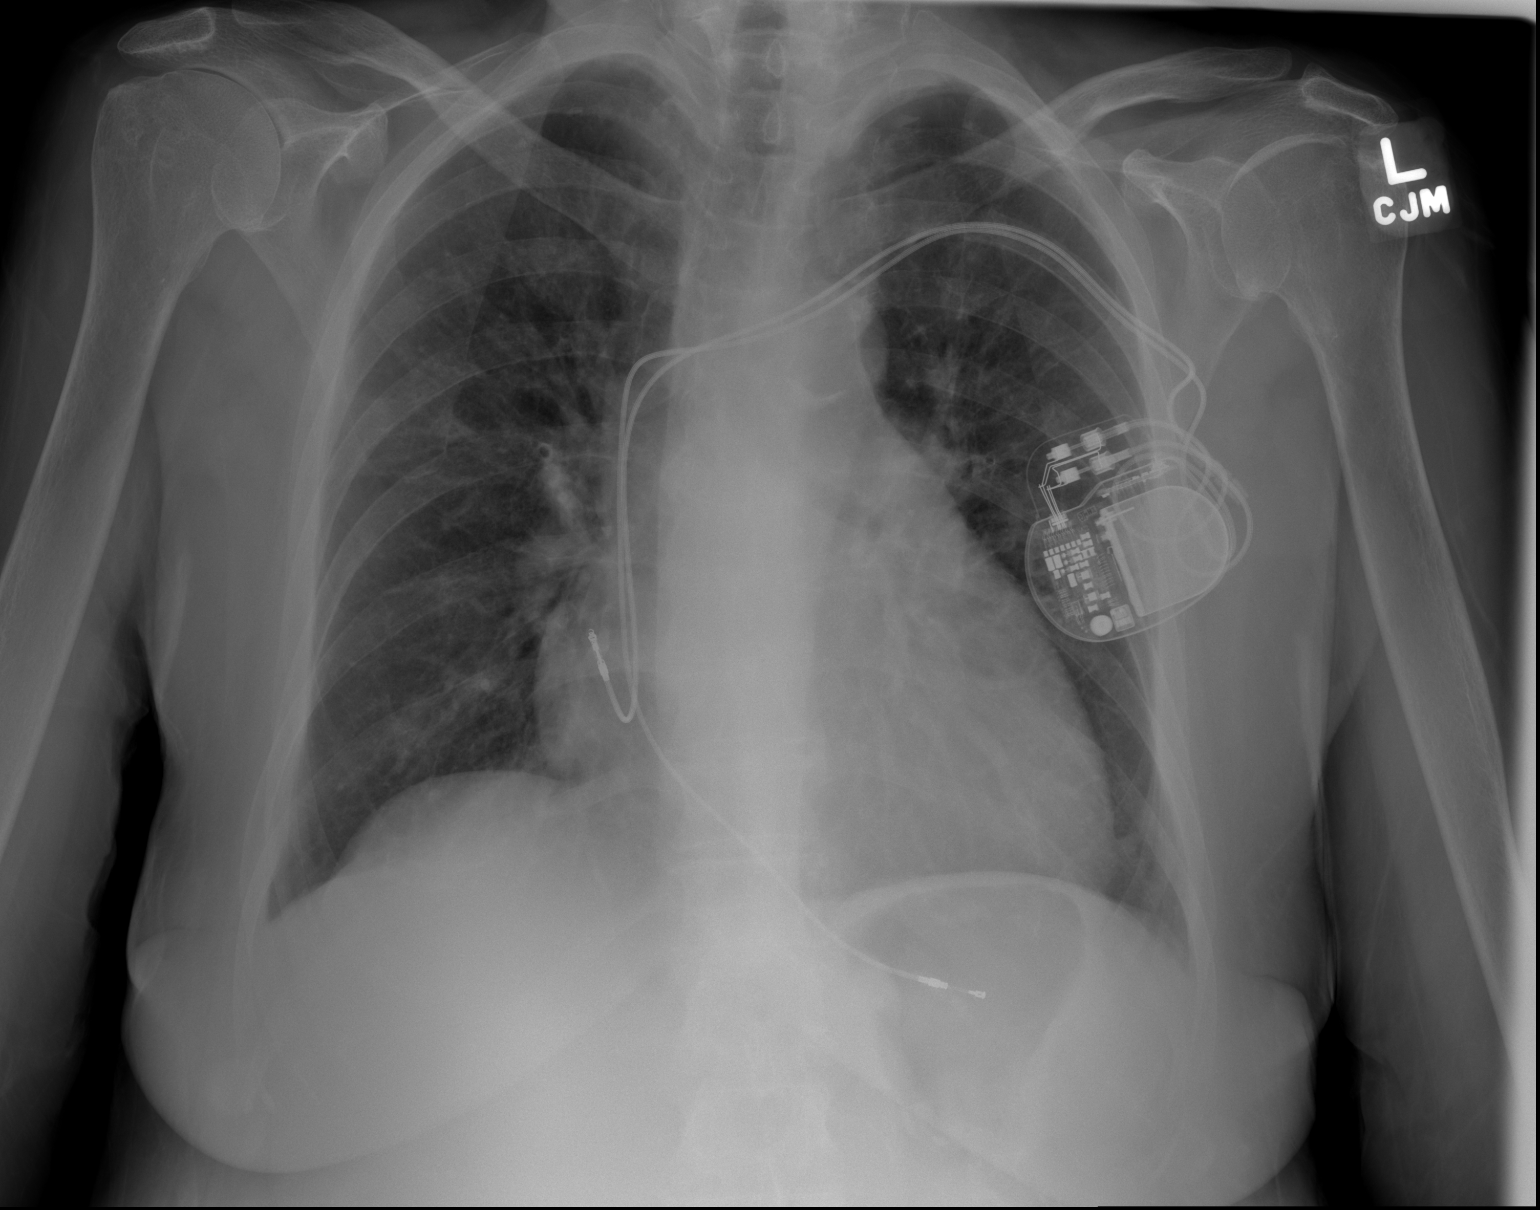

[w chest lat]
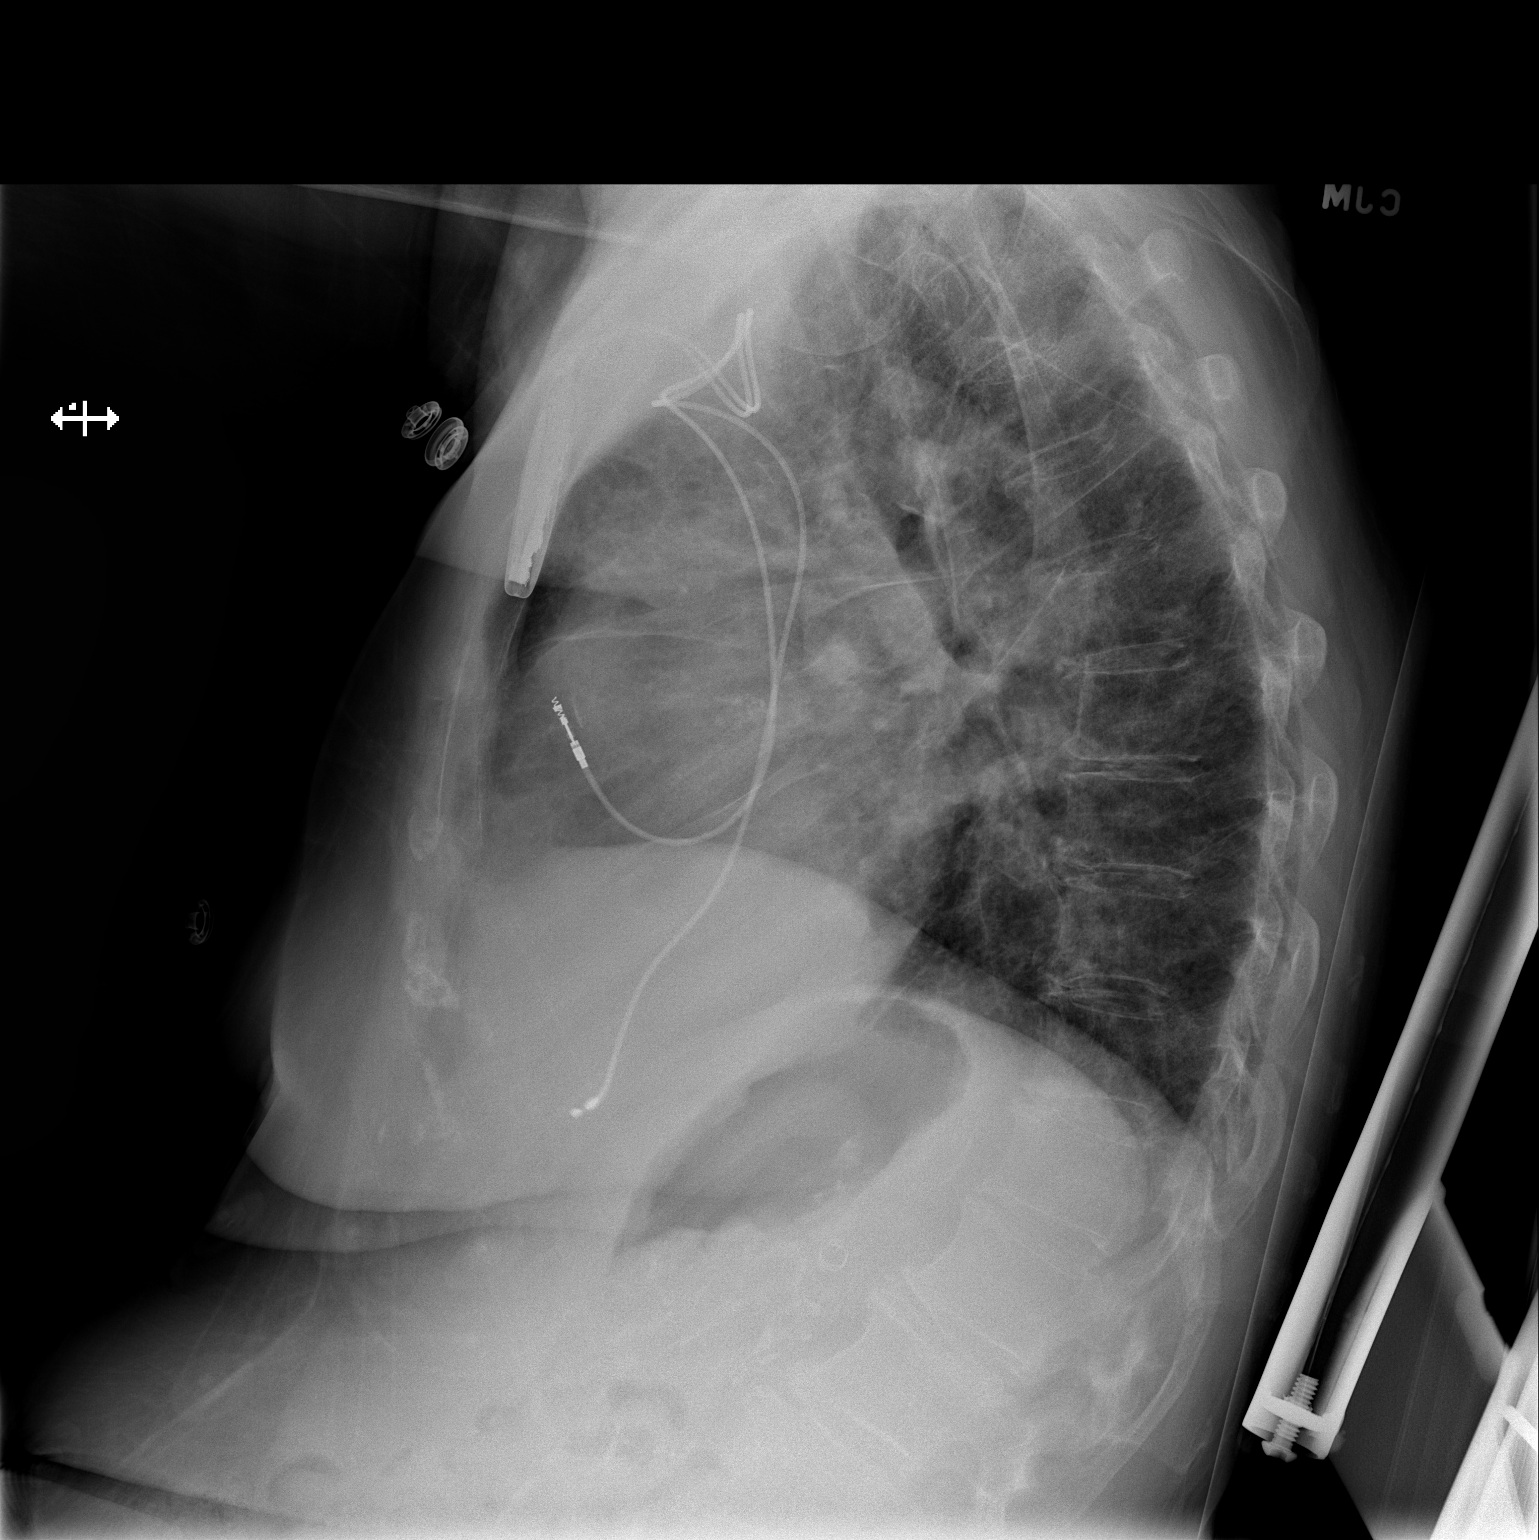

[2 of 2 positions shown; findings below may reference images not displayed]

FINDINGS: Stable appearance of cardiac pacemakers since previous
study.  Shallow inspiration with segmental elevation of the right
hemidiaphragm.  Cardiac enlargement with mild pulmonary vascular
congestion.  No evidence of edema.  No focal airspace
consolidation.  No blunting of costophrenic angles.  No
pneumothorax.  Mediastinal contours appear intact.  Degenerative
changes in the thoracic spine.  No significant change since
previous study.
IMPRESSION: Cardiac enlargement with mild pulmonary vascular congestion,
similar previous study.

## 2012-12-29 ENCOUNTER — Ambulatory Visit (HOSPITAL_COMMUNITY)
Admission: RE | Admit: 2012-12-29 | Discharge: 2012-12-29 | Disposition: A | Discharge status: Home / Self Care | Payer: Medicare Other | Source: Ambulatory Visit | Attending: Orthopedic Surgery | Admitting: Orthopedic Surgery

## 2012-12-29 NOTE — Progress Notes (Signed)
Physical Therapy Treatment Patient Details  Name: Brittney [name] MRN: [id number] Date of Birth: 1931-10-03  Today's Date: 12/29/2012 Time: 1141-1222 PT Time Calculation (min): 41 min  Visit#: 6 of 13  Re-eval: 01/26/13 Charges: Gait x 28' NMR x 10'   Authorization: [employer] Medicare  Authorization Visit#: 6 of 8   Subjective: Symptoms/Limitations Symptoms: Pt states that she walked yesterday and today at home. Pain Assessment Currently in Pain?: No/denies   Exercise/Treatments Standing Forward Step Up: 10 reps;Right;Hand Hold: 2;Step Height: 4";Limitations Forward Step Up Limitations: Min assist Gait Training: Min assist 15'  Other Standing Knee Exercises: Standing without UE assistance multiple attemps max of 31" Other Standing Knee Exercises: Side stepping 5 steps each direction with mod-max assist   Physical Therapy Assessment and Plan PT Assessment and Plan Clinical Impression Statement: Pt displays improved confidence this session. Pt requires vc's to improve control of LLE. Pt also requires vc's with gait to avoid looking at feet. Pt displays improved stability with standing without UE assistance. PT Plan: Continue to progress towards goals, increasing LE strength and ambulation independence.     Problem List Patient Active Problem List  Diagnosis  . Hypertension  . Hyperlipidemia  . Arteriosclerotic cardiovascular disease (ASCVD)  . Pacemaker-St.Jude  . Diabetes mellitus  . Dementia  . GERD (gastroesophageal reflux disease)  . Hypothyroidism  . PAD (peripheral artery disease)  . CKD (chronic kidney disease)  . Gait disorder  . Vertebrobasilar insufficiency  . Warfarin anticoagulation  . Blindness of right eye  . Atrial fibrillation  . PVD (peripheral vascular disease)  . S/P AKA (above knee amputation) unilateral  . Chronic combined systolic and diastolic congestive heart failure  . Pulmonary hypertension  . Hypotension  . Dehydration  . Nausea vomiting  and diarrhea  . Bronchitis    PT - End of Session Equipment Utilized During Treatment: Gait belt Activity Tolerance: Patient tolerated treatment well General Cognition: WFL for tasks performed  Seth Bake, PTA  12/29/2012, 3:44 PM

## 2013-01-04 ENCOUNTER — Ambulatory Visit (HOSPITAL_COMMUNITY): Payer: Medicare Other | Admitting: Physical Therapy

## 2013-01-07 ENCOUNTER — Ambulatory Visit (HOSPITAL_COMMUNITY): Payer: Medicare Other | Admitting: *Deleted

## 2013-01-10 ENCOUNTER — Ambulatory Visit (HOSPITAL_COMMUNITY)
Admission: RE | Admit: 2013-01-10 | Discharge: 2013-01-10 | Disposition: A | Discharge status: Home / Self Care | Payer: Medicare Other | Source: Ambulatory Visit | Attending: Internal Medicine | Admitting: Internal Medicine

## 2013-01-10 DIAGNOSIS — M25669 Stiffness of unspecified knee, not elsewhere classified: Secondary | ICD-10-CM | POA: Insufficient documentation

## 2013-01-10 DIAGNOSIS — I10 Essential (primary) hypertension: Secondary | ICD-10-CM | POA: Insufficient documentation

## 2013-01-10 DIAGNOSIS — R262 Difficulty in walking, not elsewhere classified: Secondary | ICD-10-CM | POA: Insufficient documentation

## 2013-01-10 DIAGNOSIS — IMO0001 Reserved for inherently not codable concepts without codable children: Secondary | ICD-10-CM | POA: Insufficient documentation

## 2013-01-10 DIAGNOSIS — M25569 Pain in unspecified knee: Secondary | ICD-10-CM | POA: Insufficient documentation

## 2013-01-10 DIAGNOSIS — M6281 Muscle weakness (generalized): Secondary | ICD-10-CM | POA: Insufficient documentation

## 2013-01-10 NOTE — Progress Notes (Signed)
Physical Therapy Treatment Patient Details  Name: Brittney [name] MRN: [id number] Date of Birth: November 16, 1931  Today's Date: 01/10/2013 Time: 1305-1348 PT Time Calculation (min): 43 min Visit#: 7 of 13  Re-eval: 01/26/13 Authorization: [facility] Medicare  Authorization Visit#: 7 of 8  Charges:  therex 25', gait 15'  Subjective: Symptoms/Limitations Symptoms: Pt. and daughter state they are working on exercises at home, walking as much as she can.  Daughter states she encouraged pt. to walk into church on Sunday.  States going up handicap ramp was most difficult for pt.   Pain Assessment Currently in Pain?: No/denies   Exercise/Treatments Standing Gait Training: Min assist 15', outdoor up/down ramp  Other Standing Knee Exercises: Standing without UE assistance and stand without AD X 4 trials.  Max of 1 minute Other Standing Knee Exercises: Side stepping 5 steps each direction with mod-max assist  Sidelying Hip ABduction: 10 reps;Left Clams: 10 reps LLE, 5 " holds      Physical Therapy Assessment and Plan PT Assessment and Plan Clinical Impression Statement: Pt. with more fear than lack of ability with going up/down ramp.  Pt currently wearing both 5ply layers under prosthesis and still with give at top.  Pt. with noted hip weakness when weighbearing on LLE.  Instructed pt/daughter with sidelying hip abduction and clams, emphasizing correct form to isolate muscle.  Daughter reports will add to HEP.  Also encouraged pt to perform prone extensions more.  Pt returns to prosthetist sometime this month and when more secure prosthesis can begin decreasing to cane. PT Plan: Continue to progress towards goals, increasing LE strength and ambulation independence.      PT - End of Session Equipment Utilized During Treatment: Gait belt Activity Tolerance: Patient tolerated treatment well General Cognition: WFL for tasks performed   Lurena Nida, PTA/CLT 01/10/2013, 3:21 PM

## 2013-01-12 ENCOUNTER — Ambulatory Visit (HOSPITAL_COMMUNITY)
Admission: RE | Admit: 2013-01-12 | Discharge: 2013-01-12 | Disposition: A | Discharge status: Home / Self Care | Payer: Medicare Other | Source: Ambulatory Visit | Attending: Orthopedic Surgery | Admitting: Orthopedic Surgery

## 2013-01-12 NOTE — Progress Notes (Signed)
Physical Therapy Treatment Patient Details  Name: Brittney [name] MRN: [id number] Date of Birth: 20-Apr-1932  Today's Date: 01/12/2013 Time: 1300-1345 PT Time Calculation (min): 45 min Visit#: 8 of 13  Re-eval: 01/26/13 Authorization: [employer] Medicare  Authorization Visit#: 8 of 10  Charges:  Gait 24', therex 15'  Subjective: Symptoms/Limitations Symptoms: Pt. reports compliance with mat exercises instructed last visit. No rubbing or pain reported in Lt residual limb. Pain Assessment Currently in Pain?: No/denies   Exercise/Treatments Standing Gait Training: 1RT in dept, focusing on keeping erect posture, looking ahead Other Standing Knee Exercises: Standing without UE assistance and stand without AD X 4 trials.  Max of 1 minute Other Standing Knee Exercises: Side stepping length of 2 lg mat tables 1RT Seated Other Seated Knee Exercises: UE arms up/down palms up/down 30"ea, front/back palms up/down 30"ea, circles cw/ccw 30"ea.  (3'total)      Physical Therapy Assessment and Plan PT Assessment and Plan Clinical Impression Statement: min assist with side stepping to Left, Max assist with Right.  Pt with difficulty engaging glute, making a full step with right without UE assist.  Added UE strenghening ex to increase activity tolerance. Overall improved standing stability without use of UE's. PT Plan: Continue to progress towards goals, increasing LE strength and ambulation independence.     Problem List Patient Active Problem List   Diagnosis Date Noted  . Hypotension 12/21/2012  . Dehydration 12/21/2012  . Nausea vomiting and diarrhea 12/21/2012  . Bronchitis 12/21/2012  . Pulmonary hypertension   . Chronic combined systolic and diastolic congestive heart failure 07/16/2012  . S/P AKA (above knee amputation) unilateral   . PVD (peripheral vascular disease) 06/14/2012  . Atrial fibrillation   . Hypertension   . Hyperlipidemia   . Arteriosclerotic cardiovascular disease (ASCVD)    . Pacemaker-St.Jude   . Diabetes mellitus   . Dementia   . GERD (gastroesophageal reflux disease)   . Hypothyroidism   . PAD (peripheral artery disease)   . CKD (chronic kidney disease)   . Gait disorder   . Vertebrobasilar insufficiency   . Warfarin anticoagulation   . Blindness of right eye     PT - End of Session Equipment Utilized During Treatment: Gait belt Activity Tolerance: Patient tolerated treatment well General Cognition: WFL for tasks performed   Lurena Nida, PTA/CLT 01/12/2013, 1:59 PM

## 2013-01-17 ENCOUNTER — Ambulatory Visit (HOSPITAL_COMMUNITY)
Admission: RE | Admit: 2013-01-17 | Discharge: 2013-01-17 | Disposition: A | Discharge status: Home / Self Care | Payer: Medicare Other | Source: Ambulatory Visit | Attending: Orthopedic Surgery | Admitting: Orthopedic Surgery

## 2013-01-17 NOTE — Progress Notes (Addendum)
Physical Therapy Treatment Patient Details  Name: Brittney [name] MRN: [id number] Date of Birth: 1932-02-19  Today's Date: 01/17/2013 Time: 4098-1191 PT Time Calculation (min): 58 min  Visit#: 9 of 13  Re-eval: 01/26/13 Charges: PPT x 30' Self care x 18'  Authorization: [employer] Medicare   Authorization Visit#: 9 of 10   Subjective: Symptoms/Limitations Symptoms: Pt states that her prosthesis is very loose and rubbing her leg.   Oswestry: 34%  Exercise/Treatments Berg Balance Test Sit to Stand: Able to stand using hands after several tries Standing Unsupported: Able to stand 30 seconds unsupported Sitting with Back Unsupported but Feet Supported on Floor or Stool: Able to sit safely and securely 2 minutes Stand to Sit: Sits independently, has uncontrolled descent Transfers: Needs one person to assist Standing Unsupported with Eyes Closed: Unable to keep eyes closed 3 seconds but stays steady Standing Ubsupported with Feet Together: Needs help to attain position and unable to hold for 15 seconds From Standing, Reach Forward with Outstretched Arm: Reaches forward but needs supervision From Standing Position, Pick up Object from Floor: Unable to pick up and needs supervision (Pt able to pick up object but requires 2 assist) From Standing Position, Turn to Look Behind Over each Shoulder: Needs supervision when turning Turn 360 Degrees: Needs assistance while turning Standing Unsupported, Alternately Place Feet on Step/Stool: Needs assistance to keep from falling or unable to try Standing Unsupported, One Foot in Front: Loses balance while stepping or standing Standing on One Leg: Unable to try or needs assist to prevent fall Total Score: 14  Physical Therapy Assessment and Plan PT Assessment and Plan Clinical Impression Statement: BERG and ABC scale completed this session. Pt is hesitant to complete many activities with BERG but willing to try. Pt requires frequent verbal cueing to look  up rather than at her feet. Pt also has a significant amount of room in her prosthesis while wearing size 5 and 3 sock. Advised pt and her daughter to contact biotech. PT Plan: Reassess and complete g-code update next session.     Problem List Patient Active Problem List   Diagnosis Date Noted  . Hypotension 12/21/2012  . Dehydration 12/21/2012  . Nausea vomiting and diarrhea 12/21/2012  . Bronchitis 12/21/2012  . Pulmonary hypertension   . Chronic combined systolic and diastolic congestive heart failure 07/16/2012  . S/P AKA (above knee amputation) unilateral   . PVD (peripheral vascular disease) 06/14/2012  . Atrial fibrillation   . Hypertension   . Hyperlipidemia   . Arteriosclerotic cardiovascular disease (ASCVD)   . Pacemaker-St.Jude   . Diabetes mellitus   . Dementia   . GERD (gastroesophageal reflux disease)   . Hypothyroidism   . PAD (peripheral artery disease)   . CKD (chronic kidney disease)   . Gait disorder   . Vertebrobasilar insufficiency   . Warfarin anticoagulation   . Blindness of right eye     PT - End of Session Equipment Utilized During Treatment: Gait belt Activity Tolerance: Patient tolerated treatment well General Behavior During Therapy: [facility] for tasks assessed/performed Cognition: WFL for tasks performed   Seth Bake, PTA 01/17/2013, 2:37 PM

## 2013-01-19 ENCOUNTER — Telehealth (HOSPITAL_COMMUNITY): Payer: Self-pay

## 2013-01-19 ENCOUNTER — Inpatient Hospital Stay (HOSPITAL_COMMUNITY): Admission: RE | Admit: 2013-01-19 | Payer: Medicare Other | Source: Ambulatory Visit | Admitting: Physical Therapy

## 2013-01-26 ENCOUNTER — Ambulatory Visit (HOSPITAL_COMMUNITY)
Admission: RE | Admit: 2013-01-26 | Discharge: 2013-01-26 | Disposition: A | Discharge status: Home / Self Care | Payer: Medicare Other | Source: Ambulatory Visit | Attending: Orthopedic Surgery | Admitting: Orthopedic Surgery

## 2013-01-26 NOTE — Progress Notes (Signed)
Physical Therapy Treatment Patient Details  Name: Brittney [name] MRN: [id number] Date of Birth: 04/20/1932  Today's Date: 01/26/2013 Time: 0800-0848 PT Time Calculation (min): 48 min  Visit#: 10 of 13  Re-eval: 01/31/13 Authorization: [facility] Medicare  Authorization Visit#: 10 of 10  Charges:  Gait 42'  Subjective: Symptoms/Limitations Symptoms: Pt states she went back to the prosthetist this past Monday and he suggested her wear the 2 5-ply socks beneath her prosthesis.  Pt. reports it is working better. Pain Assessment Currently in Pain?: No/denies   Exercise/Treatments Standing Forward Step Up: Limitations Forward Step Up Limitations: staggered stance, Rt LE on 4" step to encourage wt bearing through Lt LE X 1 minute Gait Training: 20'with HW min-mod assist, 100' with RW CGA Other Standing Knee Exercises: Standing without UE assistance and stand without AD X 4 trials.  Max of 1 minute Other Standing Knee Exercises: Side stepping length of 2 lg mat tables 1RT    Physical Therapy Assessment and Plan PT Assessment and Plan Clinical Impression Statement: Began gait training with HW today due to increased stability of prosthesis and encouragement of increasing WB through LLE.   Pt required alot of encouragement, verbal and tactile cues to tighten/stabilize Lt hip with Rt LE advancement.  Pt tends to furniture grab requiring cues not to reach out of BOS.  Reminded pt to continue mat activites focusing on Lt hip strengthening for abductors and extensors.  Daughter reports she is doing these at home.   PT Plan: Updated gcode today; reassess next visit.     Problem List Patient Active Problem List   Diagnosis Date Noted  . Hypotension 12/21/2012  . Dehydration 12/21/2012  . Nausea vomiting and diarrhea 12/21/2012  . Bronchitis 12/21/2012  . Pulmonary hypertension   . Chronic combined systolic and diastolic congestive heart failure 07/16/2012  . S/P AKA (above knee amputation)  unilateral   . PVD (peripheral vascular disease) 06/14/2012  . Atrial fibrillation   . Hypertension   . Hyperlipidemia   . Arteriosclerotic cardiovascular disease (ASCVD)   . Pacemaker-St.Jude   . Diabetes mellitus   . Dementia   . GERD (gastroesophageal reflux disease)   . Hypothyroidism   . PAD (peripheral artery disease)   . CKD (chronic kidney disease)   . Gait disorder   . Vertebrobasilar insufficiency   . Warfarin anticoagulation   . Blindness of right eye     PT - End of Session Equipment Utilized During Treatment: Gait belt Activity Tolerance: Patient tolerated treatment well General Behavior During Therapy: WFL for tasks assessed/performed Cognition: WFL for tasks performed  GP Functional Assessment Tool Used: clinical observation  Functional Limitation: Mobility: Walking and moving around Mobility: Walking and Moving Around Current Status ([id number]): At least 40 percent but less than 60 percent impaired, limited or restricted Mobility: Walking and Moving Around Goal Status [phone]): At least 1 percent but less than 20 percent impaired, limited or restricted  Lurena Nida, PTA/CLT 01/26/2013, 9:13 AM

## 2013-02-02 ENCOUNTER — Ambulatory Visit (HOSPITAL_COMMUNITY)
Admission: RE | Admit: 2013-02-02 | Discharge: 2013-02-02 | Disposition: A | Discharge status: Home / Self Care | Payer: Medicare Other | Source: Ambulatory Visit | Attending: Orthopedic Surgery | Admitting: Orthopedic Surgery

## 2013-02-02 NOTE — Evaluation (Signed)
Physical Therapy Re-evaluation  Patient Details  Name: Brittney [name] MRN: [id number] Date of Birth: 1932-02-10  Today's Date: 02/02/2013 Time: 1300-1405 PT Time Calculation (min): 65 min Charges:  MMT, gait 40'             Visit#: 11 of 21  Re-eval: 01/31/13 Assessment Diagnosis: Lt AKA Surgical Date: 07/02/12 Next MD Visit: Dr. Lajoyce Corners * May  Authorization: [facility] Medicare    Authorization Visit#: 11 of 20    Subjective Pt states she is working on some of her HEP.  States she has been moving and has not had time.  Pt reports she is fatigued today but willing to work.  No pain reported.  Assessment LLE Strength Left Hip Flexion: 5/5 (was 4/5) Left Hip Extension: 3-/5 (was 3+/5) Left Hip ABduction: 4/5 (was 3+/5) Left Hip ADduction: 3+/5 (was 3+/5)  Exercise/Treatments Mobility/Balance  Transfers Transfers: Sit to Stand;Stand to Sit Sit to Stand: 4: Min guard Stand to Sit: 4: Min guard Stand to Sit Details: assist for safety only Ambulation/Gait Ambulation/Gait: Yes Ambulation/Gait Assistance: 4: Min guard Ambulation/Gait Assistance Details: 125' with RW in 6:45 Ambulation Distance (Feet): 125 Feet Assistive device: Rolling walker Ramp: 4: Min assist Curb: 4: Min assist   Standing Gait Training: 25' with [facility] and min-mod assist     Physical Therapy Assessment and Plan PT Assessment and Plan Clinical Impression Statement: Pt is overall progressing well with therapy.  Currently, she is walking 125-200 feet with RW in under 10 minutes and is walking short distances using wide based QC.  Pt .is able to transfer and negotiate steps with min assistance.  Pt has met 1/3 STG's and 1/4 LTG's.  Overall confidence and balance with ambulation is improving.  Gains in LLE strength with continued weekness in hip extensors.  Discussed with daughter/pt. and instucted to focus on strengthening hip extensors/written HEP given and with stair negotiation instructions.  Pt will benefit from  continued therapy to futher strengthen Lt LE, improve balance and increase independence in mobility and ambulation. PT Frequency: Min 2X/week PT Duration: 4 weeks PT Plan: Continue per POC progressing towards unmet goals.    Goals Home Exercise Program Pt will Perform Home Exercise Program: Independently- Progress: Progressing toward goal  PT Short Term Goals: 4 weeks PT Short Term Goal 1: Pt will improve LLE strength in order to ambulate with improved gait mechanics for 100 feet in 10 minutes w/LRAD - Progress: Met PT Short Term Goal 2: Pt will improve her proprioception and demonstrate narrow BOS standing with eyes closed for 1 minute- Progress: Progressing toward goal PT Short Term Goal 3: Pt will complete the berg balance scale  (BBS) and dynamic gait index (DGI) in order to determine approprirate level of support. Progress: Met  PT Long Term Goals: 8 weeks PT Long Term Goal 1: Pt will improve her BBS to 30/56 and her DGI to 12/24 to decrease her risk of falls- Progress: Progressing toward goal PT Long Term Goal 2: Pt will imporve her gait speed and ambulate 0.8 ft/sec for improved safety in the community- Progress: Progressing toward goal Long Term Goal 3: Pt will improver her LLE strength to [facility] in order to tolerate ambulating 30 minutes in order to perform community mobility- Progress: Progressing toward goal Long Term Goal 4: Pt will require mod assist to don LLE prosethic in order to improve independence and decrease burden of care- Progress: Met  Problem List Patient Active Problem List   Diagnosis Date Noted  .  Hypotension 12/21/2012  . Dehydration 12/21/2012  . Nausea vomiting and diarrhea 12/21/2012  . Bronchitis 12/21/2012  . Pulmonary hypertension   . Chronic combined systolic and diastolic congestive heart failure 07/16/2012  . S/P AKA (above knee amputation) unilateral   . PVD (peripheral vascular disease) 06/14/2012  . Atrial fibrillation   . Hypertension   .  Hyperlipidemia   . Arteriosclerotic cardiovascular disease (ASCVD)   . Pacemaker-St.Jude   . Diabetes mellitus   . Dementia   . GERD (gastroesophageal reflux disease)   . Hypothyroidism   . PAD (peripheral artery disease)   . CKD (chronic kidney disease)   . Gait disorder   . Vertebrobasilar insufficiency   . Warfarin anticoagulation   . Blindness of right eye     PT - End of Session Equipment Utilized During Treatment: Gait belt Activity Tolerance: Patient tolerated treatment well General Behavior During Therapy: WFL for tasks assessed/performed Cognition: WFL for tasks performed   Lurena Nida, PTA/CLT; Annett Fabian, MPT, ATC 02/02/2013, 2:18 PM

## 2013-02-07 ENCOUNTER — Ambulatory Visit (HOSPITAL_COMMUNITY)
Admission: RE | Admit: 2013-02-07 | Discharge: 2013-02-07 | Disposition: A | Discharge status: Home / Self Care | Payer: Medicare Other | Source: Ambulatory Visit | Attending: Orthopedic Surgery | Admitting: Orthopedic Surgery

## 2013-02-07 DIAGNOSIS — M25569 Pain in unspecified knee: Secondary | ICD-10-CM | POA: Insufficient documentation

## 2013-02-07 DIAGNOSIS — I10 Essential (primary) hypertension: Secondary | ICD-10-CM | POA: Insufficient documentation

## 2013-02-07 DIAGNOSIS — IMO0001 Reserved for inherently not codable concepts without codable children: Secondary | ICD-10-CM | POA: Insufficient documentation

## 2013-02-07 DIAGNOSIS — R262 Difficulty in walking, not elsewhere classified: Secondary | ICD-10-CM | POA: Insufficient documentation

## 2013-02-07 DIAGNOSIS — M25669 Stiffness of unspecified knee, not elsewhere classified: Secondary | ICD-10-CM | POA: Insufficient documentation

## 2013-02-07 DIAGNOSIS — M6281 Muscle weakness (generalized): Secondary | ICD-10-CM | POA: Insufficient documentation

## 2013-02-07 NOTE — Progress Notes (Signed)
Physical Therapy Treatment Patient Details  Name: Brittney [name] MRN: [id number] Date of Birth: [birth year]  Today's Date: 02/07/2013 Time: 4098-1191 PT Time Calculation (min): 47 min Charge: Gait 23' NMR 23'  Visit#: 12 of 21  Re-eval: 01/31/13 Assessment Diagnosis: Lt AKA Surgical Date: 07/02/12 Next MD Visit: Dr. Lajoyce Corners * May  Authorization: [facility] Medicare  Authorization Time Period:    Authorization Visit#: 12 of 20   Subjective: Symptoms/Limitations Symptoms: Pt reports walking the house with RW.  Pt stated she is still nervous standing without HHA. Pain Assessment Currently in Pain?: No/denies  Precautions/Restrictions  Precautions Precautions: Fall  Exercise/Treatments Standing Forward Step Up: Limitations Forward Step Up Limitations: staggered stance, Rt LE on 4" step to encourage wt bearing through Lt LE 3 sets'X 1 minute Gait Training: 3x 158' gait training with RW with focus on posture and to equalize stride length for improved gait mechanics Other Standing Knee Exercises: Standing without UE assistance and AD X 4 trials.  Max of 1\' 45"  Other Standing Knee Exercises: Cone rotation on solid surface   Physical Therapy Assessment and Plan PT Assessment and Plan Clinical Impression Statement: Session focus on improving confidence with gait mechanics and balance without HHA.  Added cone rotation solid surface to increase weight bearing to Lt LE and improve balance with crossing midline, min assistance required for LOB with increase WB on Lt LE and cueing for spatial awareness.  Pt limited by fatigue at end of session.   PT Plan: Continue per POC progressing towards unmet goals.    Goals    Problem List Patient Active Problem List   Diagnosis Date Noted  . Hypotension 12/21/2012  . Dehydration 12/21/2012  . Nausea vomiting and diarrhea 12/21/2012  . Bronchitis 12/21/2012  . Pulmonary hypertension   . Chronic combined systolic and diastolic congestive heart failure  07/16/2012  . S/P AKA (above knee amputation) unilateral   . PVD (peripheral vascular disease) 06/14/2012  . Atrial fibrillation   . Hypertension   . Hyperlipidemia   . Arteriosclerotic cardiovascular disease (ASCVD)   . Pacemaker-St.Jude   . Diabetes mellitus   . Dementia   . GERD (gastroesophageal reflux disease)   . Hypothyroidism   . PAD (peripheral artery disease)   . CKD (chronic kidney disease)   . Gait disorder   . Vertebrobasilar insufficiency   . Warfarin anticoagulation   . Blindness of right eye     PT - End of Session Equipment Utilized During Treatment: Gait belt Activity Tolerance: Patient tolerated treatment well General Behavior During Therapy: WFL for tasks assessed/performed Cognition: WFL for tasks performed  GP    Juel Burrow 02/07/2013, 3:26 PM

## 2013-02-09 ENCOUNTER — Ambulatory Visit (HOSPITAL_COMMUNITY)
Admission: RE | Admit: 2013-02-09 | Discharge: 2013-02-09 | Disposition: A | Discharge status: Home / Self Care | Payer: Medicare Other | Source: Ambulatory Visit | Attending: Orthopedic Surgery | Admitting: Orthopedic Surgery

## 2013-02-09 NOTE — Progress Notes (Signed)
Physical Therapy Treatment Patient Details  Name: Brittney [name] MRN: [id number] Date of Birth: [birth year]  Today's Date: 02/09/2013 Time: 2130-8657 PT Time Calculation (min): 43 min Charge:  Therex x 8' (1302-1310), Gait 18' [phone]), NMR 15' [phone])  Visit#: 13 of 21  Re-eval: 01/31/13 Assessment Diagnosis: Lt AKA Surgical Date: 07/02/12 Next MD Visit: Dr. Lajoyce Corners  Prior Therapy: SNF October-Decemeber; HHPT Decemeber- [facility]  Authorization: [facility] Medicare  Authorization Time Period:    Authorization Visit#: 13 of 20   Subjective: Symptoms/Limitations Symptoms: Pt reported she walked all day around the house yesterday, stated carpeted areas are difficult.   Pain Assessment Currently in Pain?: No/denies  Precautions/Restrictions  Precautions Precautions: Fall  Exercise/Treatments Aerobic Stationary Bike: Nustep 8 minutes hill #3 resistance level4 Standing Forward Step Up: Limitations Forward Step Up Limitations: staggered stance, Rt LE on 4" step to encourage wt bearing through Lt LE 3 sets'X 1 minute Gait Training: 2x 216' with RW with emphasis on looking aheah and equal stride length Other Standing Knee Exercises: weigth shifting R/L 3 reps with HHA, without UE A Other Standing Knee Exercises: Standing with CGA x 2\' 48"      Physical Therapy Assessment and Plan PT Assessment and Plan Clinical Impression Statement: Session focus on improving confidence with gait and balance activities with least assistance.  Added weight shifting to POC to increase weight bearing on Lt LE and improve finding COG, pt with min assistance and vc-ing to improve spatial awareness.  Began NuStep for strengthening and activitiy tolerance with goal of >70 steps per minute. PT Plan: Continue per POC progressing towards unmet goals.    Goals    Problem List Patient Active Problem List   Diagnosis Date Noted  . Hypotension 12/21/2012  . Dehydration 12/21/2012  . Nausea vomiting and  diarrhea 12/21/2012  . Bronchitis 12/21/2012  . Pulmonary hypertension   . Chronic combined systolic and diastolic congestive heart failure 07/16/2012  . S/P AKA (above knee amputation) unilateral   . PVD (peripheral vascular disease) 06/14/2012  . Atrial fibrillation   . Hypertension   . Hyperlipidemia   . Arteriosclerotic cardiovascular disease (ASCVD)   . Pacemaker-St.Jude   . Diabetes mellitus   . Dementia   . GERD (gastroesophageal reflux disease)   . Hypothyroidism   . PAD (peripheral artery disease)   . CKD (chronic kidney disease)   . Gait disorder   . Vertebrobasilar insufficiency   . Warfarin anticoagulation   . Blindness of right eye     PT - End of Session Equipment Utilized During Treatment: Gait belt Activity Tolerance: Patient tolerated treatment well;Patient limited by fatigue General Behavior During Therapy: [facility] for tasks assessed/performed Cognition: WFL for tasks performed  GP    Juel Burrow 02/09/2013, 5:31 PM

## 2013-02-16 ENCOUNTER — Ambulatory Visit (HOSPITAL_COMMUNITY)
Admission: RE | Admit: 2013-02-16 | Discharge: 2013-02-16 | Disposition: A | Discharge status: Home / Self Care | Payer: Medicare Other | Source: Ambulatory Visit | Attending: Orthopedic Surgery | Admitting: Orthopedic Surgery

## 2013-02-16 NOTE — Progress Notes (Signed)
Physical Therapy Treatment Patient Details  Name: Brittney [name] MRN: [id number] Date of Birth: Mar 22, 1932  Today's Date: 02/16/2013 Time: 8657-8469 PT Time Calculation (min): 43 min  Visit#: 14 of 21  Re-eval: 01/31/13 Authorization: [employer] Medicare  Authorization Visit#: 14 of 20  Charges:  therex 8' 9:35-9:43, gait 33'  9:44-10:17  Subjective: Symptoms/Limitations Symptoms: Pt. states she is still fearful of falling and feels thais prevents her from walking like she wants.  Currently without pain.   Exercise/Treatments Aerobic Stationary Bike: Nustep 8 minutes hill #3 resistance level4 Standing Forward Step Up: Limitations Forward Step Up Limitations: staggered stance, Rt LE on 4" step to encourage wt bearing through Lt LE 2 sets'X 1 minute, 1X2' use of QC to get Rt foot on step then standing without UE assist. Gait Training: 20'X2 w/WBQC, 150' with RW with emphasis on looking ahead and equal stride length    Physical Therapy Assessment and Plan PT Assessment and Plan Clinical Impression Statement: Focused session on ambulation with QC, increasing stance time and confidence with LLE.  Able to increase to 2' staggard stance tolerance with LLE without use of UE's.  Improved quality of gait with increased control of Lt hip/glute with QC. PT Plan: Continue with focus on gait using QC, balance activities.     Problem List Patient Active Problem List   Diagnosis Date Noted  . Hypotension 12/21/2012  . Dehydration 12/21/2012  . Nausea vomiting and diarrhea 12/21/2012  . Bronchitis 12/21/2012  . Pulmonary hypertension   . Chronic combined systolic and diastolic congestive heart failure 07/16/2012  . S/P AKA (above knee amputation) unilateral   . PVD (peripheral vascular disease) 06/14/2012  . Atrial fibrillation   . Hypertension   . Hyperlipidemia   . Arteriosclerotic cardiovascular disease (ASCVD)   . Pacemaker-St.Jude   . Diabetes mellitus   . Dementia   . GERD  (gastroesophageal reflux disease)   . Hypothyroidism   . PAD (peripheral artery disease)   . CKD (chronic kidney disease)   . Gait disorder   . Vertebrobasilar insufficiency   . Warfarin anticoagulation   . Blindness of right eye     PT - End of Session Equipment Utilized During Treatment: Gait belt Activity Tolerance: Patient tolerated treatment well;Patient limited by fatigue General Behavior During Therapy: [facility] for tasks assessed/performed Cognition: WFL for tasks performed   Lurena Nida, PTA/CLT 02/16/2013, 10:52 AM

## 2013-03-28 ENCOUNTER — Telehealth: Payer: Self-pay | Admitting: Cardiology

## 2013-03-28 NOTE — Telephone Encounter (Signed)
PATIENT has moved to [place], [place] with daughter and would like to know if you can recommend a good cardiologist for her.

## 2013-03-29 NOTE — Telephone Encounter (Signed)
Sorry, I do not know anyone to recommend. Be sure they know that we will send a copy of our records to them as soon as they pick a new doctor

## 2013-03-29 NOTE — Telephone Encounter (Signed)
Daughter informed and verbalized understanding

## 2013-04-11 ENCOUNTER — Encounter: Payer: Self-pay | Admitting: Cardiology

## 2013-04-11 DIAGNOSIS — Z7901 Long term (current) use of anticoagulants: Secondary | ICD-10-CM | POA: Insufficient documentation

## 2013-04-11 DIAGNOSIS — R9430 Abnormal result of cardiovascular function study, unspecified: Secondary | ICD-10-CM | POA: Insufficient documentation

## 2013-04-11 DIAGNOSIS — I2510 Atherosclerotic heart disease of native coronary artery without angina pectoris: Secondary | ICD-10-CM | POA: Insufficient documentation

## 2013-04-14 ENCOUNTER — Ambulatory Visit: Payer: Medicare Other | Admitting: Cardiology

## 2014-08-17 ENCOUNTER — Encounter (HOSPITAL_COMMUNITY): Payer: Self-pay | Admitting: Internal Medicine

## 2015-06-15 DIAGNOSIS — E039 Hypothyroidism, unspecified: Secondary | ICD-10-CM | POA: Diagnosis not present

## 2015-06-15 DIAGNOSIS — I4891 Unspecified atrial fibrillation: Secondary | ICD-10-CM | POA: Diagnosis not present

## 2015-06-15 DIAGNOSIS — Z79899 Other long term (current) drug therapy: Secondary | ICD-10-CM | POA: Diagnosis not present

## 2015-06-15 DIAGNOSIS — E1151 Type 2 diabetes mellitus with diabetic peripheral angiopathy without gangrene: Secondary | ICD-10-CM | POA: Diagnosis not present

## 2015-06-15 DIAGNOSIS — I999 Unspecified disorder of circulatory system: Secondary | ICD-10-CM | POA: Diagnosis not present

## 2015-06-16 DIAGNOSIS — N3000 Acute cystitis without hematuria: Secondary | ICD-10-CM | POA: Diagnosis not present

## 2015-06-16 DIAGNOSIS — R112 Nausea with vomiting, unspecified: Secondary | ICD-10-CM | POA: Diagnosis not present

## 2015-06-16 DIAGNOSIS — N390 Urinary tract infection, site not specified: Secondary | ICD-10-CM | POA: Diagnosis not present

## 2015-06-16 DIAGNOSIS — E860 Dehydration: Secondary | ICD-10-CM | POA: Diagnosis not present

## 2015-06-17 DIAGNOSIS — I509 Heart failure, unspecified: Secondary | ICD-10-CM | POA: Diagnosis not present

## 2015-06-17 DIAGNOSIS — N390 Urinary tract infection, site not specified: Secondary | ICD-10-CM | POA: Diagnosis not present

## 2015-06-17 DIAGNOSIS — R112 Nausea with vomiting, unspecified: Secondary | ICD-10-CM | POA: Diagnosis not present

## 2015-06-17 DIAGNOSIS — N289 Disorder of kidney and ureter, unspecified: Secondary | ICD-10-CM | POA: Diagnosis not present

## 2015-06-17 DIAGNOSIS — J449 Chronic obstructive pulmonary disease, unspecified: Secondary | ICD-10-CM | POA: Diagnosis not present

## 2015-06-17 DIAGNOSIS — Z7901 Long term (current) use of anticoagulants: Secondary | ICD-10-CM | POA: Diagnosis not present

## 2015-06-17 DIAGNOSIS — I110 Hypertensive heart disease with heart failure: Secondary | ICD-10-CM | POA: Diagnosis not present

## 2015-06-17 DIAGNOSIS — E860 Dehydration: Secondary | ICD-10-CM | POA: Diagnosis not present

## 2015-06-17 DIAGNOSIS — R1110 Vomiting, unspecified: Secondary | ICD-10-CM | POA: Diagnosis not present

## 2015-06-17 DIAGNOSIS — E119 Type 2 diabetes mellitus without complications: Secondary | ICD-10-CM | POA: Diagnosis not present

## 2015-06-17 DIAGNOSIS — E7800 Pure hypercholesterolemia, unspecified: Secondary | ICD-10-CM | POA: Diagnosis not present

## 2015-06-17 DIAGNOSIS — Z79899 Other long term (current) drug therapy: Secondary | ICD-10-CM | POA: Diagnosis not present

## 2015-06-17 DIAGNOSIS — R109 Unspecified abdominal pain: Secondary | ICD-10-CM | POA: Diagnosis not present

## 2015-06-17 DIAGNOSIS — I10 Essential (primary) hypertension: Secondary | ICD-10-CM | POA: Diagnosis not present

## 2015-06-17 DIAGNOSIS — Z794 Long term (current) use of insulin: Secondary | ICD-10-CM | POA: Diagnosis not present

## 2015-06-17 DIAGNOSIS — N3000 Acute cystitis without hematuria: Secondary | ICD-10-CM | POA: Diagnosis not present

## 2015-06-17 DIAGNOSIS — R197 Diarrhea, unspecified: Secondary | ICD-10-CM | POA: Diagnosis not present

## 2015-06-17 DIAGNOSIS — Z950 Presence of cardiac pacemaker: Secondary | ICD-10-CM | POA: Diagnosis not present

## 2015-06-17 DIAGNOSIS — I482 Chronic atrial fibrillation: Secondary | ICD-10-CM | POA: Diagnosis not present

## 2015-06-28 DIAGNOSIS — I4891 Unspecified atrial fibrillation: Secondary | ICD-10-CM | POA: Diagnosis not present

## 2015-06-28 DIAGNOSIS — E039 Hypothyroidism, unspecified: Secondary | ICD-10-CM | POA: Diagnosis not present

## 2015-06-28 DIAGNOSIS — K529 Noninfective gastroenteritis and colitis, unspecified: Secondary | ICD-10-CM | POA: Diagnosis not present

## 2015-06-28 DIAGNOSIS — I5022 Chronic systolic (congestive) heart failure: Secondary | ICD-10-CM | POA: Diagnosis not present

## 2015-07-21 DIAGNOSIS — I509 Heart failure, unspecified: Secondary | ICD-10-CM | POA: Diagnosis not present

## 2015-08-20 DIAGNOSIS — I509 Heart failure, unspecified: Secondary | ICD-10-CM | POA: Diagnosis not present

## 2015-09-04 DIAGNOSIS — F0390 Unspecified dementia without behavioral disturbance: Secondary | ICD-10-CM | POA: Diagnosis not present

## 2015-09-04 DIAGNOSIS — E1151 Type 2 diabetes mellitus with diabetic peripheral angiopathy without gangrene: Secondary | ICD-10-CM | POA: Diagnosis not present

## 2015-09-04 DIAGNOSIS — J449 Chronic obstructive pulmonary disease, unspecified: Secondary | ICD-10-CM | POA: Diagnosis not present

## 2015-09-04 DIAGNOSIS — I5022 Chronic systolic (congestive) heart failure: Secondary | ICD-10-CM | POA: Diagnosis not present

## 2015-09-04 DIAGNOSIS — H11151 Pinguecula, right eye: Secondary | ICD-10-CM | POA: Diagnosis not present

## 2015-09-20 DIAGNOSIS — I509 Heart failure, unspecified: Secondary | ICD-10-CM | POA: Diagnosis not present

## 2015-09-26 DIAGNOSIS — H40051 Ocular hypertension, right eye: Secondary | ICD-10-CM | POA: Diagnosis not present

## 2015-09-26 DIAGNOSIS — H26492 Other secondary cataract, left eye: Secondary | ICD-10-CM | POA: Diagnosis not present

## 2015-09-26 DIAGNOSIS — H353131 Nonexudative age-related macular degeneration, bilateral, early dry stage: Secondary | ICD-10-CM | POA: Diagnosis not present

## 2015-09-26 DIAGNOSIS — H18421 Band keratopathy, right eye: Secondary | ICD-10-CM | POA: Diagnosis not present

## 2015-10-05 DIAGNOSIS — J449 Chronic obstructive pulmonary disease, unspecified: Secondary | ICD-10-CM | POA: Diagnosis not present

## 2015-10-21 DIAGNOSIS — I509 Heart failure, unspecified: Secondary | ICD-10-CM | POA: Diagnosis not present

## 2015-10-22 DIAGNOSIS — Z79899 Other long term (current) drug therapy: Secondary | ICD-10-CM | POA: Diagnosis not present

## 2015-10-22 DIAGNOSIS — I10 Essential (primary) hypertension: Secondary | ICD-10-CM | POA: Diagnosis not present

## 2015-10-22 DIAGNOSIS — I999 Unspecified disorder of circulatory system: Secondary | ICD-10-CM | POA: Diagnosis not present

## 2015-10-22 DIAGNOSIS — E1151 Type 2 diabetes mellitus with diabetic peripheral angiopathy without gangrene: Secondary | ICD-10-CM | POA: Diagnosis not present

## 2015-10-22 DIAGNOSIS — E785 Hyperlipidemia, unspecified: Secondary | ICD-10-CM | POA: Diagnosis not present

## 2015-10-22 DIAGNOSIS — E039 Hypothyroidism, unspecified: Secondary | ICD-10-CM | POA: Diagnosis not present

## 2015-10-31 DIAGNOSIS — I10 Essential (primary) hypertension: Secondary | ICD-10-CM | POA: Diagnosis not present

## 2015-11-05 DIAGNOSIS — J449 Chronic obstructive pulmonary disease, unspecified: Secondary | ICD-10-CM | POA: Diagnosis not present

## 2015-11-15 ENCOUNTER — Other Ambulatory Visit: Payer: Self-pay

## 2015-11-15 NOTE — Patient Outreach (Signed)
[facility] Care Management  11/15/2015  Jordyn Thedore MinsR Martell 02-20-1932 [id number]   [facility]  Several numbers tried.  HIPAA compliant voice message left on daughter's number [phone].    Plan: RN Coach will attempt patient within 1-2 weeks.  Bary Lericheionne J Keshawn Sundberg, RN, MSN [facility] RN Telephonic Health Coach [phone]

## 2015-11-18 DIAGNOSIS — I509 Heart failure, unspecified: Secondary | ICD-10-CM | POA: Diagnosis not present

## 2015-11-20 ENCOUNTER — Other Ambulatory Visit: Payer: Self-pay

## 2015-11-20 NOTE — Patient Outreach (Signed)
[facility] Care Management  11/20/2015  Jonnelle Thedore MinsR Bowmer 1932/06/14 [id number]   Telephone call to daughter's number [phone].  No answer.  HIPAA compliant voice message left.   Plan: RN Health Coach will attempt again in 1-2 weeks.    Bary Lericheionne J Sallyanne Birkhead, RN, MSN [facility] Care Management RN Telephonic Health Coach [phone]

## 2015-11-22 ENCOUNTER — Other Ambulatory Visit: Payer: Self-pay

## 2015-11-22 NOTE — Patient Outreach (Signed)
[facility] Care Management  11/22/2015  Brittney [name] 03/02/1932 [id number]   Third telephone call to patient regarding high risk referral.  No answer.  HIPAA compliant message left.    Plan: RN will send outreach letter to attempt contact.  If no response in 10 business days will proceed with case closure.  Bary Lericheionne J Athelene Hursey, RN, MSN [facility] Care [facility] RN Telephonic Health Coach [phone]

## 2015-11-23 DIAGNOSIS — G309 Alzheimer's disease, unspecified: Secondary | ICD-10-CM | POA: Diagnosis not present

## 2015-11-23 DIAGNOSIS — R0602 Shortness of breath: Secondary | ICD-10-CM | POA: Diagnosis not present

## 2015-11-23 DIAGNOSIS — N179 Acute kidney failure, unspecified: Secondary | ICD-10-CM | POA: Diagnosis not present

## 2015-11-23 DIAGNOSIS — I4891 Unspecified atrial fibrillation: Secondary | ICD-10-CM | POA: Diagnosis not present

## 2015-11-23 DIAGNOSIS — Z89612 Acquired absence of left leg above knee: Secondary | ICD-10-CM | POA: Diagnosis not present

## 2015-11-23 DIAGNOSIS — K529 Noninfective gastroenteritis and colitis, unspecified: Secondary | ICD-10-CM | POA: Diagnosis not present

## 2015-11-23 DIAGNOSIS — E1151 Type 2 diabetes mellitus with diabetic peripheral angiopathy without gangrene: Secondary | ICD-10-CM | POA: Diagnosis not present

## 2015-11-23 DIAGNOSIS — Z950 Presence of cardiac pacemaker: Secondary | ICD-10-CM | POA: Diagnosis not present

## 2015-11-23 DIAGNOSIS — Z886 Allergy status to analgesic agent status: Secondary | ICD-10-CM | POA: Diagnosis not present

## 2015-11-23 DIAGNOSIS — N390 Urinary tract infection, site not specified: Secondary | ICD-10-CM | POA: Diagnosis not present

## 2015-11-23 DIAGNOSIS — Z79899 Other long term (current) drug therapy: Secondary | ICD-10-CM | POA: Diagnosis not present

## 2015-11-23 DIAGNOSIS — Z794 Long term (current) use of insulin: Secondary | ICD-10-CM | POA: Diagnosis not present

## 2015-11-23 DIAGNOSIS — E1122 Type 2 diabetes mellitus with diabetic chronic kidney disease: Secondary | ICD-10-CM | POA: Diagnosis not present

## 2015-11-23 DIAGNOSIS — Z7902 Long term (current) use of antithrombotics/antiplatelets: Secondary | ICD-10-CM | POA: Diagnosis not present

## 2015-11-23 DIAGNOSIS — J449 Chronic obstructive pulmonary disease, unspecified: Secondary | ICD-10-CM | POA: Diagnosis not present

## 2015-11-23 DIAGNOSIS — I130 Hypertensive heart and chronic kidney disease with heart failure and stage 1 through stage 4 chronic kidney disease, or unspecified chronic kidney disease: Secondary | ICD-10-CM | POA: Diagnosis not present

## 2015-11-23 DIAGNOSIS — Z885 Allergy status to narcotic agent status: Secondary | ICD-10-CM | POA: Diagnosis not present

## 2015-11-23 DIAGNOSIS — I509 Heart failure, unspecified: Secondary | ICD-10-CM | POA: Diagnosis not present

## 2015-11-23 DIAGNOSIS — H5711 Ocular pain, right eye: Secondary | ICD-10-CM | POA: Diagnosis not present

## 2015-11-23 DIAGNOSIS — E860 Dehydration: Secondary | ICD-10-CM | POA: Diagnosis not present

## 2015-11-23 DIAGNOSIS — N189 Chronic kidney disease, unspecified: Secondary | ICD-10-CM | POA: Diagnosis not present

## 2015-11-23 DIAGNOSIS — E039 Hypothyroidism, unspecified: Secondary | ICD-10-CM | POA: Diagnosis not present

## 2015-12-03 DIAGNOSIS — J449 Chronic obstructive pulmonary disease, unspecified: Secondary | ICD-10-CM | POA: Diagnosis not present

## 2015-12-06 ENCOUNTER — Other Ambulatory Visit: Payer: Self-pay

## 2015-12-06 NOTE — Patient Outreach (Signed)
[facility] Care Management  12/06/2015  Brittney [name] Oct 18, 1931 [id number]   No response from patient after 3 outreach calls and letter.  Plan: RN Health Coach will forward patient information to Care Management Assistant for case closure.   RN Health Coach will send notification to physician.     Brittney Lericheionne J Oluwadamilola Deliz, RN, MSN [facility] Care Management RN Telephonic Health Coach [phone]

## 2016-01-15 ENCOUNTER — Other Ambulatory Visit: Payer: Self-pay | Admitting: Otolaryngology

## 2016-01-15 DIAGNOSIS — H903 Sensorineural hearing loss, bilateral: Secondary | ICD-10-CM

## 2016-01-15 DIAGNOSIS — E1051 Type 1 diabetes mellitus with diabetic peripheral angiopathy without gangrene: Secondary | ICD-10-CM

## 2016-01-18 ENCOUNTER — Ambulatory Visit
Admission: RE | Admit: 2016-01-18 | Discharge: 2016-01-18 | Disposition: A | Discharge status: Home / Self Care | Payer: Medicare Other | Source: Ambulatory Visit | Attending: Otolaryngology | Admitting: Otolaryngology

## 2016-01-18 DIAGNOSIS — E1051 Type 1 diabetes mellitus with diabetic peripheral angiopathy without gangrene: Secondary | ICD-10-CM

## 2016-01-18 DIAGNOSIS — H903 Sensorineural hearing loss, bilateral: Secondary | ICD-10-CM

## 2019-09-09 DEATH — deceased
